# Patient Record
Sex: Male | Born: 1950 | Race: White | Hispanic: No | Marital: Single | Smoking: Current every day smoker
Health system: Southern US, Community
[De-identification: ages and names within clinical notes are randomized; demographics above are authoritative.]

## PROBLEM LIST (undated history)

## (undated) DIAGNOSIS — N400 Enlarged prostate without lower urinary tract symptoms: Secondary | ICD-10-CM

## (undated) DIAGNOSIS — N200 Calculus of kidney: Secondary | ICD-10-CM

## (undated) DIAGNOSIS — E119 Type 2 diabetes mellitus without complications: Secondary | ICD-10-CM

## (undated) DIAGNOSIS — R109 Unspecified abdominal pain: Secondary | ICD-10-CM

## (undated) DIAGNOSIS — I4581 Long QT syndrome: Secondary | ICD-10-CM

## (undated) DIAGNOSIS — E7800 Pure hypercholesterolemia, unspecified: Secondary | ICD-10-CM

## (undated) DIAGNOSIS — I2510 Atherosclerotic heart disease of native coronary artery without angina pectoris: Secondary | ICD-10-CM

## (undated) DIAGNOSIS — R9082 White matter disease, unspecified: Secondary | ICD-10-CM

## (undated) DIAGNOSIS — S329XXA Fracture of unspecified parts of lumbosacral spine and pelvis, initial encounter for closed fracture: Secondary | ICD-10-CM

## (undated) DIAGNOSIS — M109 Gout, unspecified: Secondary | ICD-10-CM

## (undated) DIAGNOSIS — F0390 Unspecified dementia without behavioral disturbance: Secondary | ICD-10-CM

## (undated) DIAGNOSIS — F1010 Alcohol abuse, uncomplicated: Secondary | ICD-10-CM

## (undated) DIAGNOSIS — R10A2 Flank pain, left side: Secondary | ICD-10-CM

## (undated) DIAGNOSIS — R519 Headache, unspecified: Secondary | ICD-10-CM

## (undated) DIAGNOSIS — N261 Atrophy of kidney (terminal): Secondary | ICD-10-CM

## (undated) DIAGNOSIS — G8929 Other chronic pain: Secondary | ICD-10-CM

## (undated) DIAGNOSIS — J189 Pneumonia, unspecified organism: Secondary | ICD-10-CM

## (undated) DIAGNOSIS — I10 Essential (primary) hypertension: Secondary | ICD-10-CM

## (undated) HISTORY — DX: Alcohol abuse, uncomplicated: F10.10

## (undated) HISTORY — DX: Fracture of unspecified parts of lumbosacral spine and pelvis, initial encounter for closed fracture: S32.9XXA

## (undated) HISTORY — DX: Benign prostatic hyperplasia without lower urinary tract symptoms: N40.0

## (undated) HISTORY — PX: FRACTURE SURGERY: SHX138

## (undated) HISTORY — DX: Unspecified dementia, unspecified severity, without behavioral disturbance, psychotic disturbance, mood disturbance, and anxiety: F03.90

## (undated) HISTORY — DX: Unspecified abdominal pain: R10.9

## (undated) HISTORY — DX: Calculus of kidney: N20.0

## (undated) HISTORY — DX: Flank pain, left side: R10.A2

## (undated) HISTORY — DX: Other chronic pain: G89.29

## (undated) HISTORY — PX: LITHOTRIPSY: SUR834

## (undated) HISTORY — DX: Atrophy of kidney (terminal): N26.1

## (undated) HISTORY — PX: SPLENECTOMY: SUR1306

---

## 2003-09-02 ENCOUNTER — Other Ambulatory Visit: Payer: Self-pay

## 2006-10-24 ENCOUNTER — Emergency Department: Payer: Self-pay | Admitting: Emergency Medicine

## 2007-01-12 ENCOUNTER — Ambulatory Visit: Payer: Self-pay

## 2007-06-29 ENCOUNTER — Other Ambulatory Visit: Payer: Self-pay

## 2007-06-29 ENCOUNTER — Ambulatory Visit: Payer: Self-pay | Admitting: Urology

## 2007-07-05 ENCOUNTER — Ambulatory Visit: Payer: Self-pay | Admitting: Urology

## 2007-12-22 ENCOUNTER — Emergency Department (HOSPITAL_COMMUNITY): Admission: EM | Admit: 2007-12-22 | Discharge: 2007-12-22 | Payer: Self-pay | Admitting: Emergency Medicine

## 2007-12-23 ENCOUNTER — Ambulatory Visit: Payer: Self-pay | Admitting: Unknown Physician Specialty

## 2008-12-19 ENCOUNTER — Ambulatory Visit: Payer: Self-pay | Admitting: Urology

## 2009-03-15 ENCOUNTER — Emergency Department: Payer: Self-pay | Admitting: Emergency Medicine

## 2009-03-19 ENCOUNTER — Ambulatory Visit: Payer: Self-pay | Admitting: Urology

## 2009-03-23 ENCOUNTER — Ambulatory Visit: Payer: Self-pay | Admitting: Urology

## 2009-10-30 ENCOUNTER — Ambulatory Visit: Payer: Self-pay | Admitting: Unknown Physician Specialty

## 2010-04-22 ENCOUNTER — Ambulatory Visit: Payer: Self-pay | Admitting: Urology

## 2010-10-26 ENCOUNTER — Ambulatory Visit: Payer: Self-pay | Admitting: Urology

## 2010-10-28 ENCOUNTER — Ambulatory Visit: Payer: Self-pay | Admitting: Urology

## 2011-08-01 ENCOUNTER — Ambulatory Visit: Payer: Self-pay | Admitting: Urology

## 2011-10-11 HISTORY — PX: OTHER SURGICAL HISTORY: SHX169

## 2012-07-05 ENCOUNTER — Ambulatory Visit: Payer: Self-pay | Admitting: Urology

## 2012-08-22 ENCOUNTER — Ambulatory Visit: Payer: Self-pay | Admitting: Vascular Surgery

## 2012-08-22 LAB — CREATININE, SERUM
Creatinine: 1.85 mg/dL — ABNORMAL HIGH (ref 0.60–1.30)
EGFR (Non-African Amer.): 39 — ABNORMAL LOW

## 2012-08-23 LAB — BASIC METABOLIC PANEL
BUN: 18 mg/dL (ref 7–18)
Calcium, Total: 8.8 mg/dL (ref 8.5–10.1)
Chloride: 102 mmol/L (ref 98–107)
Co2: 25 mmol/L (ref 21–32)
Creatinine: 1.29 mg/dL (ref 0.60–1.30)
Glucose: 97 mg/dL (ref 65–99)
Potassium: 3.9 mmol/L (ref 3.5–5.1)
Sodium: 135 mmol/L — ABNORMAL LOW (ref 136–145)

## 2013-08-05 ENCOUNTER — Encounter (HOSPITAL_BASED_OUTPATIENT_CLINIC_OR_DEPARTMENT_OTHER): Payer: Self-pay | Admitting: Emergency Medicine

## 2013-08-05 ENCOUNTER — Emergency Department (HOSPITAL_BASED_OUTPATIENT_CLINIC_OR_DEPARTMENT_OTHER)
Admission: EM | Admit: 2013-08-05 | Discharge: 2013-08-05 | Disposition: A | Discharge status: Home / Self Care | Payer: Worker's Compensation | Attending: Emergency Medicine | Admitting: Emergency Medicine

## 2013-08-05 ENCOUNTER — Emergency Department (HOSPITAL_BASED_OUTPATIENT_CLINIC_OR_DEPARTMENT_OTHER): Payer: Worker's Compensation

## 2013-08-05 DIAGNOSIS — S8990XA Unspecified injury of unspecified lower leg, initial encounter: Secondary | ICD-10-CM | POA: Insufficient documentation

## 2013-08-05 DIAGNOSIS — Y92410 Unspecified street and highway as the place of occurrence of the external cause: Secondary | ICD-10-CM | POA: Insufficient documentation

## 2013-08-05 DIAGNOSIS — IMO0002 Reserved for concepts with insufficient information to code with codable children: Secondary | ICD-10-CM | POA: Insufficient documentation

## 2013-08-05 DIAGNOSIS — Y9389 Activity, other specified: Secondary | ICD-10-CM | POA: Insufficient documentation

## 2013-08-05 DIAGNOSIS — R0789 Other chest pain: Secondary | ICD-10-CM

## 2013-08-05 DIAGNOSIS — T07XXXA Unspecified multiple injuries, initial encounter: Secondary | ICD-10-CM

## 2013-08-05 DIAGNOSIS — Z79899 Other long term (current) drug therapy: Secondary | ICD-10-CM | POA: Insufficient documentation

## 2013-08-05 DIAGNOSIS — F17200 Nicotine dependence, unspecified, uncomplicated: Secondary | ICD-10-CM | POA: Insufficient documentation

## 2013-08-05 DIAGNOSIS — S298XXA Other specified injuries of thorax, initial encounter: Secondary | ICD-10-CM | POA: Insufficient documentation

## 2013-08-05 DIAGNOSIS — I10 Essential (primary) hypertension: Secondary | ICD-10-CM | POA: Insufficient documentation

## 2013-08-05 HISTORY — DX: Essential (primary) hypertension: I10

## 2013-08-05 MED ORDER — OXYCODONE-ACETAMINOPHEN 5-325 MG PO TABS: 2.0000 | ORAL_TABLET | ORAL | Status: DC | PRN

## 2013-08-05 MED ORDER — OXYCODONE-ACETAMINOPHEN 5-325 MG PO TABS
2.0000 | ORAL_TABLET | Freq: Once | ORAL | Status: AC
  Administered 2013-08-05: 2 via ORAL
  Filled 2013-08-05: qty 2

## 2013-08-05 MED ORDER — LORAZEPAM 1 MG PO TABS
0.5000 mg | ORAL_TABLET | Freq: Once | ORAL | Status: AC
  Administered 2013-08-05: 0.5 mg via ORAL
  Filled 2013-08-05: qty 1
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
## 2013-09-20 ENCOUNTER — Ambulatory Visit: Payer: Self-pay | Admitting: Urology

## 2014-01-15 ENCOUNTER — Inpatient Hospital Stay: Payer: Self-pay | Admitting: Internal Medicine

## 2014-01-15 LAB — TROPONIN I
Troponin-I: <0.02 ng/mL
Troponin-I: <0.02 ng/mL

## 2014-01-15 LAB — BASIC METABOLIC PANEL
ANION GAP: 4 — AB (ref 7–16)
BUN: 40 mg/dL — ABNORMAL HIGH (ref 7–18)
CALCIUM: 8.6 mg/dL (ref 8.5–10.1)
CREATININE: 2.3 mg/dL — AB (ref 0.60–1.30)
Chloride: 106 mmol/L (ref 98–107)
Co2: 26 mmol/L (ref 21–32)
EGFR (African American): 34 — ABNORMAL LOW
GFR CALC NON AF AMER: 30 — AB
Glucose: 75 mg/dL (ref 65–99)
OSMOLALITY: 280 (ref 275–301)
Potassium: 5 mmol/L (ref 3.5–5.1)
Sodium: 136 mmol/L (ref 136–145)

## 2014-01-15 LAB — URINALYSIS, COMPLETE
Bacteria: NONE SEEN
Bilirubin,UR: NEGATIVE
Blood: NEGATIVE
Glucose,UR: NEGATIVE mg/dL (ref 0–75)
Hyaline Cast: 18
KETONE: NEGATIVE
Leukocyte Esterase: NEGATIVE
Nitrite: NEGATIVE
PROTEIN: NEGATIVE
Ph: 5 (ref 4.5–8.0)
RBC, UR: NONE SEEN /HPF (ref 0–5)
SPECIFIC GRAVITY: 1.014 (ref 1.003–1.030)
Squamous Epithelial: NONE SEEN
WBC UR: NONE SEEN /HPF (ref 0–5)

## 2014-01-15 LAB — CBC WITH DIFFERENTIAL/PLATELET
BASOS PCT: 0.8 %
Basophil #: 0.1 10*3/uL (ref 0.0–0.1)
EOS ABS: 0.3 10*3/uL (ref 0.0–0.7)
EOS PCT: 1.7 %
HCT: 33.5 % — ABNORMAL LOW (ref 40.0–52.0)
HGB: 11.4 g/dL — AB (ref 13.0–18.0)
LYMPHS ABS: 4.8 10*3/uL — AB (ref 1.0–3.6)
Lymphocyte %: 27.6 %
MCH: 37.5 pg — ABNORMAL HIGH (ref 26.0–34.0)
MCHC: 34 g/dL (ref 32.0–36.0)
MCV: 110 fL — AB (ref 80–100)
MONOS PCT: 8 %
Monocyte #: 1.4 x10 3/mm — ABNORMAL HIGH (ref 0.2–1.0)
NEUTROS ABS: 10.7 10*3/uL — AB (ref 1.4–6.5)
NEUTROS PCT: 61.9 %
Platelet: 308 10*3/uL (ref 150–440)
RBC: 3.04 10*6/uL — ABNORMAL LOW (ref 4.40–5.90)
RDW: 14.1 % (ref 11.5–14.5)
WBC: 17.4 10*3/uL — ABNORMAL HIGH (ref 3.8–10.6)

## 2014-01-15 LAB — HEMOGLOBIN A1C: HEMOGLOBIN A1C: 5.6 % (ref 4.2–6.3)

## 2014-01-15 LAB — DRUG SCREEN, URINE
Amphetamines, Ur Screen: NEGATIVE (ref ?–1000)
BENZODIAZEPINE, UR SCRN: POSITIVE (ref ?–200)
Barbiturates, Ur Screen: NEGATIVE (ref ?–200)
Cannabinoid 50 Ng, Ur ~~LOC~~: NEGATIVE (ref ?–50)
Cocaine Metabolite,Ur ~~LOC~~: NEGATIVE (ref ?–300)
MDMA (Ecstasy)Ur Screen: NEGATIVE (ref ?–500)
Methadone, Ur Screen: NEGATIVE (ref ?–300)
OPIATE, UR SCREEN: POSITIVE (ref ?–300)
Phencyclidine (PCP) Ur S: NEGATIVE (ref ?–25)
Tricyclic, Ur Screen: NEGATIVE (ref ?–1000)

## 2014-01-15 LAB — MAGNESIUM: MAGNESIUM: 1.2 mg/dL — AB

## 2014-01-15 LAB — CK TOTAL AND CKMB (NOT AT ARMC)
CK, Total: 84 U/L
CK-MB: 1.6 ng/mL (ref 0.5–3.6)

## 2014-01-16 LAB — CBC WITH DIFFERENTIAL/PLATELET
Basophil #: 0.1 10*3/uL (ref 0.0–0.1)
Basophil %: 0.6 %
EOS ABS: 0.2 10*3/uL (ref 0.0–0.7)
Eosinophil %: 1.5 %
HCT: 31.9 % — ABNORMAL LOW (ref 40.0–52.0)
HGB: 11.1 g/dL — ABNORMAL LOW (ref 13.0–18.0)
LYMPHS ABS: 4.4 10*3/uL — AB (ref 1.0–3.6)
Lymphocyte %: 29.8 %
MCH: 38.3 pg — AB (ref 26.0–34.0)
MCHC: 34.8 g/dL (ref 32.0–36.0)
MCV: 110 fL — AB (ref 80–100)
Monocyte #: 1.3 x10 3/mm — ABNORMAL HIGH (ref 0.2–1.0)
Monocyte %: 8.8 %
NEUTROS ABS: 8.8 10*3/uL — AB (ref 1.4–6.5)
NEUTROS PCT: 59.3 %
PLATELETS: 289 10*3/uL (ref 150–440)
RBC: 2.9 10*6/uL — ABNORMAL LOW (ref 4.40–5.90)
RDW: 14.1 % (ref 11.5–14.5)
WBC: 14.8 10*3/uL — ABNORMAL HIGH (ref 3.8–10.6)

## 2014-01-16 LAB — BASIC METABOLIC PANEL
Anion Gap: 5 — ABNORMAL LOW (ref 7–16)
BUN: 29 mg/dL — ABNORMAL HIGH (ref 7–18)
CHLORIDE: 111 mmol/L — AB (ref 98–107)
CREATININE: 1.85 mg/dL — AB (ref 0.60–1.30)
Calcium, Total: 8.5 mg/dL (ref 8.5–10.1)
Co2: 23 mmol/L (ref 21–32)
GFR CALC AF AMER: 45 — AB
GFR CALC NON AF AMER: 38 — AB
Glucose: 110 mg/dL — ABNORMAL HIGH (ref 65–99)
Osmolality: 284 (ref 275–301)
Potassium: 5 mmol/L (ref 3.5–5.1)
Sodium: 139 mmol/L (ref 136–145)

## 2014-01-20 LAB — CULTURE, BLOOD (SINGLE)

## 2014-11-17 ENCOUNTER — Emergency Department (HOSPITAL_COMMUNITY): Payer: 59

## 2014-11-17 ENCOUNTER — Encounter (HOSPITAL_COMMUNITY): Payer: Self-pay | Admitting: Emergency Medicine

## 2014-11-17 ENCOUNTER — Inpatient Hospital Stay (HOSPITAL_COMMUNITY)
Admission: EM | Admit: 2014-11-17 | Discharge: 2014-11-25 | DRG: 492 | Disposition: A | Discharge status: Rehabilitation Facility | Payer: 59 | Attending: General Surgery | Admitting: General Surgery

## 2014-11-17 ENCOUNTER — Telehealth (HOSPITAL_COMMUNITY): Payer: Self-pay

## 2014-11-17 DIAGNOSIS — I129 Hypertensive chronic kidney disease with stage 1 through stage 4 chronic kidney disease, or unspecified chronic kidney disease: Secondary | ICD-10-CM | POA: Diagnosis present

## 2014-11-17 DIAGNOSIS — F1910 Other psychoactive substance abuse, uncomplicated: Secondary | ICD-10-CM | POA: Diagnosis present

## 2014-11-17 DIAGNOSIS — M109 Gout, unspecified: Secondary | ICD-10-CM | POA: Diagnosis present

## 2014-11-17 DIAGNOSIS — N189 Chronic kidney disease, unspecified: Secondary | ICD-10-CM | POA: Diagnosis present

## 2014-11-17 DIAGNOSIS — W109XXA Fall (on) (from) unspecified stairs and steps, initial encounter: Secondary | ICD-10-CM | POA: Diagnosis present

## 2014-11-17 DIAGNOSIS — E780 Pure hypercholesterolemia: Secondary | ICD-10-CM | POA: Diagnosis present

## 2014-11-17 DIAGNOSIS — S0083XA Contusion of other part of head, initial encounter: Secondary | ICD-10-CM | POA: Diagnosis present

## 2014-11-17 DIAGNOSIS — D62 Acute posthemorrhagic anemia: Secondary | ICD-10-CM | POA: Diagnosis not present

## 2014-11-17 DIAGNOSIS — S32810A Multiple fractures of pelvis with stable disruption of pelvic ring, initial encounter for closed fracture: Secondary | ICD-10-CM

## 2014-11-17 DIAGNOSIS — S3282XA Multiple fractures of pelvis without disruption of pelvic ring, initial encounter for closed fracture: Secondary | ICD-10-CM | POA: Diagnosis present

## 2014-11-17 DIAGNOSIS — S42412A Displaced simple supracondylar fracture without intercondylar fracture of left humerus, initial encounter for closed fracture: Secondary | ICD-10-CM | POA: Diagnosis present

## 2014-11-17 DIAGNOSIS — J95821 Acute postprocedural respiratory failure: Secondary | ICD-10-CM | POA: Diagnosis not present

## 2014-11-17 DIAGNOSIS — Z01818 Encounter for other preprocedural examination: Secondary | ICD-10-CM

## 2014-11-17 DIAGNOSIS — Z4659 Encounter for fitting and adjustment of other gastrointestinal appliance and device: Secondary | ICD-10-CM

## 2014-11-17 DIAGNOSIS — I10 Essential (primary) hypertension: Secondary | ICD-10-CM | POA: Diagnosis present

## 2014-11-17 DIAGNOSIS — R339 Retention of urine, unspecified: Secondary | ICD-10-CM | POA: Diagnosis not present

## 2014-11-17 DIAGNOSIS — G9341 Metabolic encephalopathy: Secondary | ICD-10-CM | POA: Diagnosis not present

## 2014-11-17 DIAGNOSIS — F17210 Nicotine dependence, cigarettes, uncomplicated: Secondary | ICD-10-CM | POA: Diagnosis present

## 2014-11-17 DIAGNOSIS — Z6825 Body mass index (BMI) 25.0-25.9, adult: Secondary | ICD-10-CM

## 2014-11-17 DIAGNOSIS — Z9119 Patient's noncompliance with other medical treatment and regimen: Secondary | ICD-10-CM | POA: Diagnosis present

## 2014-11-17 DIAGNOSIS — W19XXXA Unspecified fall, initial encounter: Secondary | ICD-10-CM | POA: Diagnosis present

## 2014-11-17 DIAGNOSIS — F1310 Sedative, hypnotic or anxiolytic abuse, uncomplicated: Secondary | ICD-10-CM | POA: Diagnosis present

## 2014-11-17 DIAGNOSIS — S2242XA Multiple fractures of ribs, left side, initial encounter for closed fracture: Secondary | ICD-10-CM | POA: Diagnosis present

## 2014-11-17 DIAGNOSIS — R531 Weakness: Secondary | ICD-10-CM | POA: Diagnosis present

## 2014-11-17 DIAGNOSIS — D72829 Elevated white blood cell count, unspecified: Secondary | ICD-10-CM | POA: Diagnosis not present

## 2014-11-17 DIAGNOSIS — Y92009 Unspecified place in unspecified non-institutional (private) residence as the place of occurrence of the external cause: Secondary | ICD-10-CM

## 2014-11-17 DIAGNOSIS — D539 Nutritional anemia, unspecified: Secondary | ICD-10-CM | POA: Diagnosis present

## 2014-11-17 DIAGNOSIS — N170 Acute kidney failure with tubular necrosis: Secondary | ICD-10-CM | POA: Diagnosis not present

## 2014-11-17 DIAGNOSIS — E861 Hypovolemia: Secondary | ICD-10-CM | POA: Diagnosis present

## 2014-11-17 DIAGNOSIS — F10239 Alcohol dependence with withdrawal, unspecified: Secondary | ICD-10-CM | POA: Diagnosis present

## 2014-11-17 DIAGNOSIS — Z91199 Patient's noncompliance with other medical treatment and regimen due to unspecified reason: Secondary | ICD-10-CM

## 2014-11-17 DIAGNOSIS — S32119A Unspecified Zone I fracture of sacrum, initial encounter for closed fracture: Secondary | ICD-10-CM | POA: Diagnosis present

## 2014-11-17 DIAGNOSIS — E43 Unspecified severe protein-calorie malnutrition: Secondary | ICD-10-CM | POA: Insufficient documentation

## 2014-11-17 DIAGNOSIS — S42302A Unspecified fracture of shaft of humerus, left arm, initial encounter for closed fracture: Secondary | ICD-10-CM

## 2014-11-17 DIAGNOSIS — R739 Hyperglycemia, unspecified: Secondary | ICD-10-CM | POA: Diagnosis present

## 2014-11-17 DIAGNOSIS — Y92019 Unspecified place in single-family (private) house as the place of occurrence of the external cause: Secondary | ICD-10-CM | POA: Diagnosis not present

## 2014-11-17 DIAGNOSIS — S329XXA Fracture of unspecified parts of lumbosacral spine and pelvis, initial encounter for closed fracture: Secondary | ICD-10-CM

## 2014-11-17 DIAGNOSIS — S2239XA Fracture of one rib, unspecified side, initial encounter for closed fracture: Secondary | ICD-10-CM

## 2014-11-17 DIAGNOSIS — S42402A Unspecified fracture of lower end of left humerus, initial encounter for closed fracture: Secondary | ICD-10-CM | POA: Diagnosis present

## 2014-11-17 DIAGNOSIS — E785 Hyperlipidemia, unspecified: Secondary | ICD-10-CM | POA: Diagnosis present

## 2014-11-17 DIAGNOSIS — F1010 Alcohol abuse, uncomplicated: Secondary | ICD-10-CM | POA: Diagnosis present

## 2014-11-17 DIAGNOSIS — S0093XA Contusion of unspecified part of head, initial encounter: Secondary | ICD-10-CM

## 2014-11-17 DIAGNOSIS — S42409A Unspecified fracture of lower end of unspecified humerus, initial encounter for closed fracture: Secondary | ICD-10-CM

## 2014-11-17 HISTORY — DX: Fracture of unspecified parts of lumbosacral spine and pelvis, initial encounter for closed fracture: S32.9XXA

## 2014-11-17 HISTORY — DX: Pure hypercholesterolemia, unspecified: E78.00

## 2014-11-17 HISTORY — DX: Gout, unspecified: M10.9

## 2014-11-17 LAB — COMPREHENSIVE METABOLIC PANEL WITH GFR
ALT: 24 U/L (ref 0–53)
AST: 45 U/L — ABNORMAL HIGH (ref 0–37)
Albumin: 3.5 g/dL (ref 3.5–5.2)
Alkaline Phosphatase: 115 U/L (ref 39–117)
Anion gap: 13 (ref 5–15)
BUN: 21 mg/dL (ref 6–23)
CO2: 19 mmol/L (ref 19–32)
Calcium: 8.5 mg/dL (ref 8.4–10.5)
Chloride: 106 mmol/L (ref 96–112)
Creatinine, Ser: 2.24 mg/dL — ABNORMAL HIGH (ref 0.50–1.35)
GFR calc Af Amer: 34 mL/min — ABNORMAL LOW (ref >90)
GFR calc non Af Amer: 30 mL/min — ABNORMAL LOW (ref >90)
Glucose, Bld: 121 mg/dL — ABNORMAL HIGH (ref 70–99)
Potassium: 5.1 mmol/L (ref 3.5–5.1)
Sodium: 138 mmol/L (ref 135–145)
Total Bilirubin: 1.7 mg/dL — ABNORMAL HIGH (ref 0.3–1.2)
Total Protein: 5.7 g/dL — ABNORMAL LOW (ref 6.0–8.3)

## 2014-11-17 LAB — URINALYSIS, ROUTINE W REFLEX MICROSCOPIC
Bilirubin Urine: NEGATIVE
Glucose, UA: NEGATIVE mg/dL
Ketones, ur: NEGATIVE mg/dL
Leukocytes, UA: NEGATIVE
Nitrite: NEGATIVE
Protein, ur: NEGATIVE mg/dL
Specific Gravity, Urine: 1.014 (ref 1.005–1.030)
Urobilinogen, UA: 0.2 mg/dL (ref 0.0–1.0)
pH: 5 (ref 5.0–8.0)

## 2014-11-17 LAB — I-STAT VENOUS BLOOD GAS, ED
ACID-BASE DEFICIT: 7 mmol/L — AB (ref 0.0–2.0)
Bicarbonate: 19.1 mEq/L — ABNORMAL LOW (ref 20.0–24.0)
O2 Saturation: 47 %
PH VEN: 7.285 (ref 7.250–7.300)
PO2 VEN: 29 mmHg — AB (ref 30.0–45.0)
TCO2: 20 mmol/L (ref 0–100)
pCO2, Ven: 40.3 mmHg — ABNORMAL LOW (ref 45.0–50.0)

## 2014-11-17 LAB — CBC WITH DIFFERENTIAL/PLATELET
BASOS ABS: 0 10*3/uL (ref 0.0–0.1)
BASOS PCT: 0 % (ref 0–1)
Eosinophils Absolute: 0 10*3/uL (ref 0.0–0.7)
Eosinophils Relative: 0 % (ref 0–5)
HCT: 26.8 % — ABNORMAL LOW (ref 39.0–52.0)
Hemoglobin: 9.2 g/dL — ABNORMAL LOW (ref 13.0–17.0)
LYMPHS PCT: 2 % — AB (ref 12–46)
Lymphs Abs: 0.4 10*3/uL — ABNORMAL LOW (ref 0.7–4.0)
MCH: 36.8 pg — ABNORMAL HIGH (ref 26.0–34.0)
MCHC: 34.3 g/dL (ref 30.0–36.0)
MCV: 107.2 fL — ABNORMAL HIGH (ref 78.0–100.0)
MONO ABS: 1.7 10*3/uL — AB (ref 0.1–1.0)
Monocytes Relative: 9 % (ref 3–12)
NEUTROS ABS: 16.6 10*3/uL — AB (ref 1.7–7.7)
NEUTROS PCT: 89 % — AB (ref 43–77)
Platelets: 285 10*3/uL (ref 150–400)
RBC: 2.5 MIL/uL — ABNORMAL LOW (ref 4.22–5.81)
RDW: 14.8 % (ref 11.5–15.5)
WBC: 18.7 10*3/uL — AB (ref 4.0–10.5)

## 2014-11-17 LAB — ETHANOL

## 2014-11-17 LAB — URINE MICROSCOPIC-ADD ON

## 2014-11-17 LAB — RETICULOCYTES
RBC.: 2.07 MIL/uL — ABNORMAL LOW (ref 4.22–5.81)
RETIC COUNT ABSOLUTE: 58 10*3/uL (ref 19.0–186.0)
Retic Ct Pct: 2.8 % (ref 0.4–3.1)

## 2014-11-17 LAB — TSH: TSH: 1.88 u[IU]/mL (ref 0.350–4.500)

## 2014-11-17 LAB — MRSA PCR SCREENING: MRSA by PCR: NEGATIVE

## 2014-11-17 LAB — AMMONIA: AMMONIA: 33 umol/L — AB (ref 11–32)

## 2014-11-17 LAB — BRAIN NATRIURETIC PEPTIDE: B Natriuretic Peptide: 103.7 pg/mL — ABNORMAL HIGH (ref 0.0–100.0)

## 2014-11-17 LAB — LACTIC ACID, PLASMA
LACTIC ACID, VENOUS: 1.5 mmol/L (ref 0.5–2.0)
Lactic Acid, Venous: 3.7 mmol/L (ref 0.5–2.0)

## 2014-11-17 LAB — I-STAT TROPONIN, ED: TROPONIN I, POC: 0 ng/mL (ref 0.00–0.08)

## 2014-11-17 MED ORDER — LORAZEPAM 2 MG/ML IJ SOLN
1.0000 mg | Freq: Four times a day (QID) | INTRAMUSCULAR | Status: DC | PRN
Start: 2014-11-17 — End: 2014-11-17

## 2014-11-17 MED ORDER — LORAZEPAM 1 MG PO TABS
0.0000 mg | ORAL_TABLET | Freq: Four times a day (QID) | ORAL | Status: DC
Start: 2014-11-17 — End: 2014-11-17
  Administered 2014-11-17: 2 mg via ORAL
  Filled 2014-11-17: qty 2

## 2014-11-17 MED ORDER — SODIUM CHLORIDE 0.9 % IV BOLUS (SEPSIS)
1000.0000 mL | Freq: Once | INTRAVENOUS | Status: AC
  Administered 2014-11-17: 1000 mL via INTRAVENOUS

## 2014-11-17 MED ORDER — VITAMIN B-1 100 MG PO TABS
100.0000 mg | ORAL_TABLET | Freq: Every day | ORAL | Status: DC
  Administered 2014-11-21 – 2014-11-25 (×5): 100 mg via ORAL
  Filled 2014-11-17 (×9): qty 1

## 2014-11-17 MED ORDER — PANTOPRAZOLE SODIUM 40 MG IV SOLR
40.0000 mg | Freq: Every day | INTRAVENOUS | Status: DC
  Administered 2014-11-18 – 2014-11-20 (×3): 40 mg via INTRAVENOUS
  Filled 2014-11-17 (×5): qty 40

## 2014-11-17 MED ORDER — THIAMINE HCL 100 MG/ML IJ SOLN
100.0000 mg | Freq: Every day | INTRAMUSCULAR | Status: DC
  Administered 2014-11-17 – 2014-11-20 (×4): 100 mg via INTRAVENOUS
  Filled 2014-11-17 (×6): qty 1

## 2014-11-17 MED ORDER — TETANUS-DIPHTH-ACELL PERTUSSIS 5-2.5-18.5 LF-MCG/0.5 IM SUSP: 0.5000 mL | Freq: Once | INTRAMUSCULAR | Status: DC

## 2014-11-17 MED ORDER — OXYCODONE HCL 5 MG PO TABS
5.0000 mg | ORAL_TABLET | ORAL | Status: DC | PRN
  Administered 2014-11-17: 5 mg via ORAL
  Administered 2014-11-18 (×2): 10 mg via ORAL
  Administered 2014-11-19 – 2014-11-20 (×3): 15 mg via ORAL
  Administered 2014-11-21: 10 mg via ORAL
  Administered 2014-11-21 (×2): 15 mg via ORAL
  Administered 2014-11-22: 10 mg via ORAL
  Administered 2014-11-22: 15 mg via ORAL
  Administered 2014-11-22: 5 mg via ORAL
  Administered 2014-11-23 – 2014-11-25 (×7): 15 mg via ORAL
  Filled 2014-11-17 (×6): qty 3
  Filled 2014-11-17: qty 2
  Filled 2014-11-17: qty 3
  Filled 2014-11-17: qty 1
  Filled 2014-11-17: qty 2
  Filled 2014-11-17 (×2): qty 1
  Filled 2014-11-17 (×4): qty 3
  Filled 2014-11-17: qty 2
  Filled 2014-11-17: qty 3
  Filled 2014-11-17: qty 2
  Filled 2014-11-17: qty 3

## 2014-11-17 MED ORDER — MORPHINE SULFATE 2 MG/ML IJ SOLN: 2.0000 mg | INTRAMUSCULAR | Status: DC | PRN

## 2014-11-17 MED ORDER — MORPHINE SULFATE 4 MG/ML IJ SOLN
7.0000 mg | Freq: Once | INTRAMUSCULAR | Status: AC
  Administered 2014-11-17: 7 mg via INTRAVENOUS
  Filled 2014-11-17: qty 2

## 2014-11-17 MED ORDER — ADULT MULTIVITAMIN W/MINERALS CH
1.0000 | ORAL_TABLET | Freq: Every day | ORAL | Status: DC
  Administered 2014-11-18 – 2014-11-25 (×8): 1 via ORAL
  Filled 2014-11-17 (×9): qty 1

## 2014-11-17 MED ORDER — DEXTROSE-NACL 5-0.45 % IV SOLN
INTRAVENOUS | Status: DC
  Administered 2014-11-17 – 2014-11-21 (×6): via INTRAVENOUS

## 2014-11-17 MED ORDER — ONDANSETRON HCL 4 MG/2ML IJ SOLN
4.0000 mg | Freq: Four times a day (QID) | INTRAMUSCULAR | Status: DC | PRN
  Administered 2014-11-19 – 2014-11-24 (×3): 4 mg via INTRAVENOUS
  Filled 2014-11-17 (×3): qty 2

## 2014-11-17 MED ORDER — LORAZEPAM 1 MG PO TABS: 0.0000 mg | ORAL_TABLET | Freq: Two times a day (BID) | ORAL | Status: DC

## 2014-11-17 MED ORDER — ONDANSETRON HCL 4 MG PO TABS: 4.0000 mg | ORAL_TABLET | Freq: Four times a day (QID) | ORAL | Status: DC | PRN

## 2014-11-17 MED ORDER — FOLIC ACID 5 MG/ML IJ SOLN
1.0000 mg | Freq: Every day | INTRAMUSCULAR | Status: DC
  Administered 2014-11-18 – 2014-11-21 (×4): 1 mg via INTRAVENOUS
  Filled 2014-11-17 (×7): qty 0.2

## 2014-11-17 MED ORDER — LORAZEPAM 2 MG/ML IJ SOLN
2.0000 mg | INTRAMUSCULAR | Status: DC | PRN
  Administered 2014-11-17 – 2014-11-19 (×3): 2 mg via INTRAVENOUS
  Filled 2014-11-17 (×3): qty 1

## 2014-11-17 MED ORDER — MORPHINE SULFATE 4 MG/ML IJ SOLN
7.0000 mg | Freq: Once | INTRAMUSCULAR | Status: AC
Start: 2014-11-17 — End: 2014-11-17
  Administered 2014-11-17: 7 mg via INTRAVENOUS
  Filled 2014-11-17: qty 2

## 2014-11-17 MED ORDER — IOHEXOL 300 MG/ML  SOLN: 25.0000 mL | INTRAMUSCULAR | Status: AC

## 2014-11-17 MED ORDER — IPRATROPIUM-ALBUTEROL 0.5-2.5 (3) MG/3ML IN SOLN
3.0000 mL | RESPIRATORY_TRACT | Status: DC
  Administered 2014-11-17 (×2): 3 mL via RESPIRATORY_TRACT
  Filled 2014-11-17 (×2): qty 3

## 2014-11-17 MED ORDER — DOCUSATE SODIUM 100 MG PO CAPS
100.0000 mg | ORAL_CAPSULE | Freq: Two times a day (BID) | ORAL | Status: DC
  Administered 2014-11-18 – 2014-11-25 (×13): 100 mg via ORAL
  Filled 2014-11-17 (×18): qty 1

## 2014-11-17 MED ORDER — SIMVASTATIN 40 MG PO TABS
40.0000 mg | ORAL_TABLET | Freq: Every day | ORAL | Status: DC
  Administered 2014-11-18 – 2014-11-25 (×7): 40 mg via ORAL
  Filled 2014-11-17 (×8): qty 1

## 2014-11-17 MED ORDER — IPRATROPIUM-ALBUTEROL 0.5-2.5 (3) MG/3ML IN SOLN
3.0000 mL | Freq: Three times a day (TID) | RESPIRATORY_TRACT | Status: DC
  Administered 2014-11-18 – 2014-11-24 (×17): 3 mL via RESPIRATORY_TRACT
  Filled 2014-11-17 (×18): qty 3

## 2014-11-17 MED ORDER — THIAMINE HCL 100 MG/ML IJ SOLN: 100.0000 mg | Freq: Every day | INTRAMUSCULAR | Status: DC

## 2014-11-17 MED ORDER — FOLIC ACID 1 MG PO TABS
1.0000 mg | ORAL_TABLET | Freq: Every day | ORAL | Status: DC
  Filled 2014-11-17: qty 1

## 2014-11-17 MED ORDER — PANTOPRAZOLE SODIUM 40 MG PO TBEC
40.0000 mg | DELAYED_RELEASE_TABLET | Freq: Every day | ORAL | Status: DC
  Administered 2014-11-21 – 2014-11-24 (×4): 40 mg via ORAL
  Filled 2014-11-17 (×5): qty 1

## 2014-11-17 MED ORDER — ENOXAPARIN SODIUM 30 MG/0.3ML ~~LOC~~ SOLN
30.0000 mg | SUBCUTANEOUS | Status: DC
  Administered 2014-11-18 – 2014-11-24 (×7): 30 mg via SUBCUTANEOUS
  Filled 2014-11-17 (×9): qty 0.3

## 2014-11-17 MED ORDER — ALLOPURINOL 150 MG HALF TABLET
150.0000 mg | ORAL_TABLET | Freq: Every day | ORAL | Status: DC
  Administered 2014-11-18 – 2014-11-25 (×8): 150 mg via ORAL
  Filled 2014-11-17 (×8): qty 1

## 2014-11-17 MED ORDER — LORAZEPAM 1 MG PO TABS
1.0000 mg | ORAL_TABLET | Freq: Four times a day (QID) | ORAL | Status: DC | PRN
Start: 2014-11-17 — End: 2014-11-17

## 2014-11-17 NOTE — ED Notes (Signed)
Pt has multiple skin tears on arms. Pt has skin tear to right hand, and forearm. Pt has large skin tear to left forearm and widespread swelling/purple bruising to left forearm up to elbow. Pt states when he fell yesterday, he fell down stairs and onto concrete. Pt again states he did not lose consciousness. Pt has abrasions to right side of forehead.

## 2014-11-17 NOTE — Consult Note (Signed)
Reason for Consult:Left elbow and left hip fractures  Referring [place] R Beadles is an 64 y.o. male.  LGX:[id number] mechanical fall yesterday without loss of conciousness. Patient attempted to drive self to work this AM but was unable to make it to work. Sister reports he was found in parking lot at work . Patient brought to ER and found to have a left elbow and pelvic fractures.  Past Medical History  Diagnosis Date  . Hypertension   . Hypercholesteremia     Past Surgical History  Procedure Laterality Date  . Nephrectomy      History reviewed. No pertinent family history.  Social History:  reports that he has been smoking.  He does not have any smokeless tobacco history on file. He reports that he drinks alcohol. His drug history is not on file.  Allergies: No Known Allergies  Medications: I have reviewed the patient's current medications.  Results for orders placed or performed during the hospital encounter of 11/17/14 (from the past 48 hour(s))  CBC with Differential     Status: Abnormal   Collection Time: 11/17/14  9:26 AM  Result Value Ref Range   WBC 18.7 (H) 4.0 - 10.5 K/uL   RBC 2.50 (L) 4.22 - 5.81 MIL/uL   Hemoglobin 9.2 (L) 13.0 - 17.0 g/dL   HCT 26.8 (L) 39.0 - 52.0 %   MCV 107.2 (H) 78.0 - 100.0 fL   MCH 36.8 (H) 26.0 - 34.0 pg   MCHC 34.3 30.0 - 36.0 g/dL   RDW 14.8 11.5 - 15.5 %   Platelets 285 150 - 400 K/uL   Neutrophils Relative % 89 (H) 43 - 77 %   Neutro Abs 16.6 (H) 1.7 - 7.7 K/uL   Lymphocytes Relative 2 (L) 12 - 46 %   Lymphs Abs 0.4 (L) 0.7 - 4.0 K/uL   Monocytes Relative 9 3 - 12 %   Monocytes Absolute 1.7 (H) 0.1 - 1.0 K/uL   Eosinophils Relative 0 0 - 5 %   Eosinophils Absolute 0.0 0.0 - 0.7 K/uL   Basophils Relative 0 0 - 1 %   Basophils Absolute 0.0 0.0 - 0.1 K/uL  Comprehensive metabolic panel     Status: Abnormal   Collection Time: 11/17/14  9:26 AM  Result Value Ref Range   Sodium 138 135 - 145 mmol/L    Potassium 5.1 3.5 - 5.1 mmol/L   Chloride 106 96 - 112 mmol/L   CO2 19 19 - 32 mmol/L   Glucose, Bld 121 (H) 70 - 99 mg/dL   BUN 21 6 - 23 mg/dL   Creatinine, Ser 2.24 (H) 0.50 - 1.35 mg/dL   Calcium 8.5 8.4 - 10.5 mg/dL   Total Protein 5.7 (L) 6.0 - 8.3 g/dL   Albumin 3.5 3.5 - 5.2 g/dL   AST 45 (H) 0 - 37 U/L   ALT 24 0 - 53 U/L   Alkaline Phosphatase 115 39 - 117 U/L   Total Bilirubin 1.7 (H) 0.3 - 1.2 mg/dL   GFR calc non Af Amer 30 (L) >90 mL/min   GFR calc Af Amer 34 (L) >90 mL/min    Comment: (NOTE) The eGFR has been calculated using the CKD EPI equation. This calculation has not been validated in all clinical situations. eGFR's persistently <90 mL/min signify possible Chronic Kidney Disease.    Anion gap 13 5 - 15  Brain natriuretic peptide     Status: Abnormal   Collection Time: 11/17/14  9:31 AM  Result Value Ref Range   B Natriuretic Peptide 103.7 (H) 0.0 - 100.0 pg/mL  Lactic acid, plasma     Status: Abnormal   Collection Time: 11/17/14  9:36 AM  Result Value Ref Range   Lactic Acid, Venous 3.7 (HH) 0.5 - 2.0 mmol/L    Comment: REPEATED TO VERIFY CRITICAL RESULT CALLED TO, READ BACK BY AND VERIFIED WITH: D RICE,RN AT 1036 11/17/14 BY K BARR   TSH     Status: None   Collection Time: 11/17/14  9:36 AM  Result Value Ref Range   TSH 1.880 0.350 - 4.500 uIU/mL  I-Stat Troponin, ED (not at [facility])     Status: None   Collection Time: 11/17/14  9:41 AM  Result Value Ref Range   Troponin i, poc 0.00 0.00 - 0.08 ng/mL   Comment 3            Comment: Due to the release kinetics of cTnI, a negative result within the first hours of the onset of symptoms does not rule out myocardial infarction with certainty. If myocardial infarction is still suspected, repeat the test at appropriate intervals.   Urinalysis, Routine w reflex microscopic     Status: Abnormal   Collection Time: 11/17/14 11:35 AM  Result Value Ref Range   Color, Urine YELLOW YELLOW   APPearance CLEAR CLEAR    Specific Gravity, Urine 1.014 1.005 - 1.030   pH 5.0 5.0 - 8.0   Glucose, UA NEGATIVE NEGATIVE mg/dL   Hgb urine dipstick SMALL (A) NEGATIVE   Bilirubin Urine NEGATIVE NEGATIVE   Ketones, ur NEGATIVE NEGATIVE mg/dL   Protein, ur NEGATIVE NEGATIVE mg/dL   Urobilinogen, UA 0.2 0.0 - 1.0 mg/dL   Nitrite NEGATIVE NEGATIVE   Leukocytes, UA NEGATIVE NEGATIVE  Urine microscopic-add on     Status: None   Collection Time: 11/17/14 11:35 AM  Result Value Ref Range   Squamous Epithelial / LPF RARE RARE   WBC, UA 0-2 <3 WBC/hpf   RBC / HPF 0-2 <3 RBC/hpf   Bacteria, UA RARE RARE  Lactic acid, plasma     Status: None   Collection Time: 11/17/14 12:05 PM  Result Value Ref Range   Lactic Acid, Venous 1.5 0.5 - 2.0 mmol/L  Ammonia     Status: Abnormal   Collection Time: 11/17/14 12:05 PM  Result Value Ref Range   Ammonia 33 (H) 11 - 32 umol/L  I-Stat Venous Blood Gas, ED (order at [facility] and [facility] only)     Status: Abnormal   Collection Time: 11/17/14 12:15 PM  Result Value Ref Range   pH, Ven 7.285 7.250 - 7.300   pCO2, Ven 40.3 (L) 45.0 - 50.0 mmHg   pO2, Ven 29.0 (LL) 30.0 - 45.0 mmHg   Bicarbonate 19.1 (L) 20.0 - 24.0 mEq/L   TCO2 20 0 - 100 mmol/L   O2 Saturation 47.0 %   Acid-base deficit 7.0 (H) 0.0 - 2.0 mmol/L   Sample type VENOUS    Comment NOTIFIED PHYSICIAN   Ethanol     Status: None   Collection Time: 11/17/14 12:34 PM  Result Value Ref Range   Alcohol, Ethyl (B) <5 0 - 9 mg/dL    Comment:        LOWEST DETECTABLE LIMIT FOR SERUM ALCOHOL IS 11 mg/dL FOR MEDICAL PURPOSES ONLY     Ct Abdomen Pelvis Wo Contrast  11/17/2014   CLINICAL DATA:  Fall with pelvic and rib fractures. Renal insufficiency as  contraindication to intravenous contrast  EXAM: CT CHEST, ABDOMEN AND PELVIS WITHOUT CONTRAST  TECHNIQUE: Multidetector CT imaging of the chest, abdomen and pelvis was performed following the standard protocol without IV contrast.  COMPARISON:  None.  FINDINGS: CT CHEST FINDINGS   THORACIC INLET/BODY WALL:  No acute abnormality.  MEDIASTINUM:  Normal heart size. No pericardial effusion. Extensive atherosclerosis, including the coronary arteries, especially for age. No mediastinal hematoma or other evidence of vascular injury. Small sliding hiatal hernia.  LUNG WINDOWS:  No contusion, hemothorax, or pneumothorax.  Paraseptal and centrilobular emphysema.  OSSEOUS:  See below  CT ABDOMEN AND PELVIS FINDINGS  BODY WALL: Unremarkable.  Liver: Mildly dense liver parenchyma which improves visualization. There is no evidence of laceration or contusion. No perihepatic hematoma.  Biliary: No evidence of biliary obstruction or stone.  Pancreas: Unremarkable.  Spleen: Splenectomy.  Adrenals: Unremarkable.  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 Mild enlargement of the right kidney is likely compensatory hypertrophy. Bilateral nephrolithiasis, up to 4 mm in the left lower pole. There is a presumed cyst in the left kidney, 3 cm in diameter. No evidence for renal injury.  Bladder: No evidence of wall thickening to suggest injury. Left extraperitoneal fluid is high-density and consistent with hematoma rather than urine.  Reproductive: Unremarkable.  Bowel: No evidence of injury  Peritoneum: No free fluid or gas.  Vascular: No periaortic hematoma to suggest acute injury. There is extensive atherosclerosis with fusiform infrarenal ectasia at 25 mm. There is focal ectasia of the suprarenal aorta at 28 mm.  OSSEOUS: Displaced fracture of the supracondylar left humerus with extensive surrounding soft tissue swelling. As noted previously there is surrounding mineralization which could reflect subacute timing.  Mildly displaced left obturator ring fracture involving the inferior pubic ramus and the puboacetabular junction. There a vertical fracture of the left sacral ala, with transverse component at the level of S2. No displacement along the foramina. Moderate extraperitoneal pelvic hematoma  related to the fractures. Cannot evaluate for active hemorrhage without intravenous contrast.  Anteriorly dislocated sternoclavicular joint on the left. Chronic fragmentation of the joint is compatible with remote injury.  Bilateral rib fractures are healed or healing. No definite acute rib fractur.  L5 superior endplate deformity which appears chronic.  IMPRESSION: 1. Mildly displaced fractures of the left inferior pubic ramus and puboacetabular junction with mild to moderate extraperitoneal hematoma. 2. S3 body and left sacral ala fractures without displacement along the foramina. 3. Displaced left supracondylar humerus fracture. 4. No evidence of intrathoracic or intra-abdominal injury. The patient is status post splenectomy.   Electronically Signed   By: Jorje Guild M.D.   On: 11/17/2014 12:34   Dg Chest 2 View  11/17/2014   CLINICAL DATA:  Acute left-sided body pain after fall yesterday. Initial encounter.  EXAM: CHEST  2 VIEW  COMPARISON:  None.  FINDINGS: The heart size and mediastinal contours are within normal limits. Both lungs are clear. No pneumothorax or pleural effusion is noted. Old right rib fractures are noted. Mildly displaced acute fracture is seen involving lateral portion of left seventh rib.  IMPRESSION: Mildly displaced left seventh rib fracture. No acute cardiopulmonary abnormality seen.   Electronically Signed   By: Sabino Dick M.D.   On: 11/17/2014 10:31   Dg Pelvis 1-2 Views  11/17/2014   CLINICAL DATA:  Acute left pelvic pain after fall yesterday. Initial encounter.  EXAM: PELVIS - 1-2 VIEW  COMPARISON:  September 20, 2013.  FINDINGS: Mildly displaced fracture is  seen involving the left inferior pubic ramus. Mildly displaced medial left acetabular fracture is noted. These appear to be closed and posttraumatic. Sacroiliac joints appear normal.  IMPRESSION: Mildly displaced fractures are seen involving the medial left acetabulum and left inferior pubic ramus.   Electronically Signed    By: Sabino Dick M.D.   On: 11/17/2014 10:28   Dg Forearm Left  11/17/2014   CLINICAL DATA:  Fall on left-sided body with humerus pain and swelling. Initial encounter.  EXAM: LEFT FOREARM - 2 VIEW  COMPARISON:  None.  FINDINGS: Transverse fracture through the supracondylar humerus with marked anterior displacement. There is callus around the nonunited fracture, suggesting subacute injury. Diffuse surrounding soft tissue swelling. The elbow articulation is intact.  IMPRESSION: Subacute appearing displaced supracondylar humerus fracture.   Electronically Signed   By: Jorje Guild M.D.   On: 11/17/2014 10:27   Ct Head Wo Contrast  11/17/2014   CLINICAL DATA:  Acute right arm pain and head injury after fall today.  EXAM: CT HEAD WITHOUT CONTRAST  CT CERVICAL SPINE WITHOUT CONTRAST  TECHNIQUE: Multidetector CT imaging of the head and cervical spine was performed following the standard protocol without intravenous contrast. Multiplanar CT image reconstructions of the cervical spine were also generated.  COMPARISON:  CT scan of January 15, 2014.  FINDINGS: CT HEAD FINDINGS  Bony calvarium appears intact. Small right frontal scalp hematoma is noted. Mild diffuse cortical atrophy is noted. Mild chronic ischemic white matter disease is noted. No mass effect or midline shift is noted. Ventricular size is within normal limits. There is no evidence of mass lesion, hemorrhage or acute infarction.  CT CERVICAL SPINE FINDINGS  No fracture or spondylolisthesis is noted. Mild degenerative disc disease is noted at C5-6 and C6-7 with anterior and posterior osteophyte formation. Mild hypertrophy of right posterior facet joint at C4-5 is noted. Visualized lung apices appear normal.  IMPRESSION: Small right frontal scalp hematoma. Mild diffuse cortical atrophy. Mild chronic ischemic white matter disease. No acute intracranial abnormality seen.  Mild degenerative disc disease is noted at C5-6 and C6-7. No acute abnormality seen in  the cervical spine.   Electronically Signed   By: Sabino Dick M.D.   On: 11/17/2014 11:01   Ct Chest Wo Contrast  11/17/2014   CLINICAL DATA:  Fall with pelvic and rib fractures. Renal insufficiency as contraindication to intravenous contrast  EXAM: CT CHEST, ABDOMEN AND PELVIS WITHOUT CONTRAST  TECHNIQUE: Multidetector CT imaging of the chest, abdomen and pelvis was performed following the standard protocol without IV contrast.  COMPARISON:  None.  FINDINGS: CT CHEST FINDINGS  THORACIC INLET/BODY WALL:  No acute abnormality.  MEDIASTINUM:  Normal heart size. No pericardial effusion. Extensive atherosclerosis, including the coronary arteries, especially for age. No mediastinal hematoma or other evidence of vascular injury. Small sliding hiatal hernia.  LUNG WINDOWS:  No contusion, hemothorax, or pneumothorax.  Paraseptal and centrilobular emphysema.  OSSEOUS:  See below  CT ABDOMEN AND PELVIS FINDINGS  BODY WALL: Unremarkable.  Liver: Mildly dense liver parenchyma which improves visualization. There is no evidence of laceration or contusion. No perihepatic hematoma.  Biliary: No evidence of biliary obstruction or stone.  Pancreas: Unremarkable.  Spleen: Splenectomy.  Adrenals: Unremarkable.  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 Mild enlargement of the right kidney is likely compensatory hypertrophy. Bilateral nephrolithiasis, up to 4 mm in the left lower pole. There is a presumed cyst in the left kidney, 3 cm in diameter. No evidence for renal  injury.  Bladder: No evidence of wall thickening to suggest injury. Left extraperitoneal fluid is high-density and consistent with hematoma rather than urine.  Reproductive: Unremarkable.  Bowel: No evidence of injury  Peritoneum: No free fluid or gas.  Vascular: No periaortic hematoma to suggest acute injury. There is extensive atherosclerosis with fusiform infrarenal ectasia at 25 mm. There is focal ectasia of the suprarenal aorta at 28 mm.   OSSEOUS: Displaced fracture of the supracondylar left humerus with extensive surrounding soft tissue swelling. As noted previously there is surrounding mineralization which could reflect subacute timing.  Mildly displaced left obturator ring fracture involving the inferior pubic ramus and the puboacetabular junction. There a vertical fracture of the left sacral ala, with transverse component at the level of S2. No displacement along the foramina. Moderate extraperitoneal pelvic hematoma related to the fractures. Cannot evaluate for active hemorrhage without intravenous contrast.  Anteriorly dislocated sternoclavicular joint on the left. Chronic fragmentation of the joint is compatible with remote injury.  Bilateral rib fractures are healed or healing. No definite acute rib fractur.  L5 superior endplate deformity which appears chronic.  IMPRESSION: 1. Mildly displaced fractures of the left inferior pubic ramus and puboacetabular junction with mild to moderate extraperitoneal hematoma. 2. S3 body and left sacral ala fractures without displacement along the foramina. 3. Displaced left supracondylar humerus fracture. 4. No evidence of intrathoracic or intra-abdominal injury. The patient is status post splenectomy.   Electronically Signed   By: Jorje Guild M.D.   On: 11/17/2014 12:34   Ct Cervical Spine Wo Contrast  11/17/2014   CLINICAL DATA:  Acute right arm pain and head injury after fall today.  EXAM: CT HEAD WITHOUT CONTRAST  CT CERVICAL SPINE WITHOUT CONTRAST  TECHNIQUE: Multidetector CT imaging of the head and cervical spine was performed following the standard protocol without intravenous contrast. Multiplanar CT image reconstructions of the cervical spine were also generated.  COMPARISON:  CT scan of January 15, 2014.  FINDINGS: CT HEAD FINDINGS  Bony calvarium appears intact. Small right frontal scalp hematoma is noted. Mild diffuse cortical atrophy is noted. Mild chronic ischemic white matter disease is  noted. No mass effect or midline shift is noted. Ventricular size is within normal limits. There is no evidence of mass lesion, hemorrhage or acute infarction.  CT CERVICAL SPINE FINDINGS  No fracture or spondylolisthesis is noted. Mild degenerative disc disease is noted at C5-6 and C6-7 with anterior and posterior osteophyte formation. Mild hypertrophy of right posterior facet joint at C4-5 is noted. Visualized lung apices appear normal.  IMPRESSION: Small right frontal scalp hematoma. Mild diffuse cortical atrophy. Mild chronic ischemic white matter disease. No acute intracranial abnormality seen.  Mild degenerative disc disease is noted at C5-6 and C6-7. No acute abnormality seen in the cervical spine.   Electronically Signed   By: Sabino Dick M.D.   On: 11/17/2014 11:01   Dg Humerus Left  11/17/2014   CLINICAL DATA:  Status post fall 11/16/2014 with left upper arm pain.  EXAM: LEFT HUMERUS - 2+ VIEW  COMPARISON:  None.  FINDINGS: The patient has an acute transcondylar fracture of the distal humerus. No other acute bony or joint abnormality is identified.  IMPRESSION: Acute transcondylar fracture distal humerus.   Electronically Signed   By: Inge Rise M.D.   On: 11/17/2014 10:28   Dg Femur Min 2 Views Left  11/17/2014   CLINICAL DATA:  Status post fall 11/16/2014. Left pelvic and upper leg pain. Initial encounter.  EXAM: LEFT FEMUR 2 VIEWS  COMPARISON:  None.  FINDINGS: Acute left superior and inferior pubic ramus fractures are identified. No other acute bony or joint abnormality is seen. No notable degenerative disease about the hip or knee is identified. Atherosclerosis is noted.  IMPRESSION: Acute right superior and inferior pubic ramus fractures.   Electronically Signed   By: Inge Rise M.D.   On: 11/17/2014 10:29    Review of Systems  Constitutional: Negative.   HENT: Negative.   Eyes: Negative.   Respiratory: Negative.   Cardiovascular: Negative.   Gastrointestinal: Negative.    Genitourinary: Negative.   Musculoskeletal:       Left arm pain Left hip pain  Skin: Negative.   Neurological: Negative.   Endo/Heme/Allergies: Negative.   Psychiatric/Behavioral: Negative.    Blood pressure 134/78, pulse 105, temperature 97.5 F (36.4 C), temperature source Oral, resp. rate 12, height $RemoveBe'5\' 7"'[id number]$  (1.702 m), weight 74.844 kg (165 lb), SpO2 97 %. Physical Exam  Constitutional: He is oriented to person, place, and time. He appears well-developed and well-nourished.  HENT:  Head: Normocephalic.  Old Abrasion mid forehead   Eyes: EOM are normal.  Cardiovascular: Normal rate and intact distal pulses.   Respiratory: Effort normal.  Musculoskeletal:  Left elbow tenderness decreased ROM and pain with attempted ROM. Edema right elbow with significant ecchymosis.  Remaining upper extremity non tender. Abrasions of elbow no open puncture wounds.  Left hip pain with attempted ROM. Right hip no pain with ROM. Non tender over bilateral lower legs, calves supple non tender. Knees non tender bilateral. Ankles non tender. Dorsi/plantar flexion ankles intact.  Neurological: He is alert and oriented to person, place, and time.  Sensation grossly intact bilateral feet and hands to light touch.  Full motor of both hands.  Skin: Skin is warm and dry.  Ecchymosis bilateral upper extremities.   Psychiatric:  Answers questions appropriately with occasional inability to follow directions ? Secondary to pain meds.    Assessment/Plan: Mildly displaced fractures of the left inferior pubic ramus and puboacetabular junction - Weight bearing as tolerated  Displaced left supracondylar humerus fracture- Will require olecranon osteotomy fixation in future . Placed in long arm splint with sling for comfort. Wounds dressed with xerofrom. Left arm non weight bearing. Erskine Emery 11/17/2014, 4:49 PM

## 2014-11-17 NOTE — H&P (Signed)
Edward Singleton is an 64 y.o. male.   Chief Complaint: Fall HPI: Edward Singleton was at home when he tripped and fell yesterday afternoon. He denied losing consciousness but said he had to rest for about 2 hours afterwards. He got up this morning and attempted to go to work but hurt too bad and came to the ED for evaluation.  Past Medical History  Diagnosis Date  . Hypertension   . Hypercholesteremia     Past Surgical History  Procedure Laterality Date  . Nephrectomy      History reviewed. No pertinent family history. Social History:  reports that he has been smoking.  He does not have any smokeless tobacco history on file. He reports that he drinks alcohol. His drug history is not on file.  Allergies: No Known Allergies  Results for orders placed or performed during the hospital encounter of 11/17/14 (from the past 48 hour(s))  CBC with Differential     Status: Abnormal   Collection Time: 11/17/14  9:26 AM  Result Value Ref Range   WBC 18.7 (H) 4.0 - 10.5 K/uL   RBC 2.50 (L) 4.22 - 5.81 MIL/uL   Hemoglobin 9.2 (L) 13.0 - 17.0 g/dL   HCT 26.8 (L) 39.0 - 52.0 %   MCV 107.2 (H) 78.0 - 100.0 fL   MCH 36.8 (H) 26.0 - 34.0 pg   MCHC 34.3 30.0 - 36.0 g/dL   RDW 14.8 11.5 - 15.5 %   Platelets 285 150 - 400 K/uL   Neutrophils Relative % 89 (H) 43 - 77 %   Neutro Abs 16.6 (H) 1.7 - 7.7 K/uL   Lymphocytes Relative 2 (L) 12 - 46 %   Lymphs Abs 0.4 (L) 0.7 - 4.0 K/uL   Monocytes Relative 9 3 - 12 %   Monocytes Absolute 1.7 (H) 0.1 - 1.0 K/uL   Eosinophils Relative 0 0 - 5 %   Eosinophils Absolute 0.0 0.0 - 0.7 K/uL   Basophils Relative 0 0 - 1 %   Basophils Absolute 0.0 0.0 - 0.1 K/uL  Comprehensive metabolic panel     Status: Abnormal   Collection Time: 11/17/14  9:26 AM  Result Value Ref Range   Sodium 138 135 - 145 mmol/L   Potassium 5.1 3.5 - 5.1 mmol/L   Chloride 106 96 - 112 mmol/L   CO2 19 19 - 32 mmol/L   Glucose, Bld 121 (H) 70 - 99 mg/dL   BUN 21 6 - 23 mg/dL   Creatinine,  Ser 2.24 (H) 0.50 - 1.35 mg/dL   Calcium 8.5 8.4 - 10.5 mg/dL   Total Protein 5.7 (L) 6.0 - 8.3 g/dL   Albumin 3.5 3.5 - 5.2 g/dL   AST 45 (H) 0 - 37 U/L   ALT 24 0 - 53 U/L   Alkaline Phosphatase 115 39 - 117 U/L   Total Bilirubin 1.7 (H) 0.3 - 1.2 mg/dL   GFR calc non Af Amer 30 (L) >90 mL/min   GFR calc Af Amer 34 (L) >90 mL/min    Comment: (NOTE) The eGFR has been calculated using the CKD EPI equation. This calculation has not been validated in all clinical situations. eGFR's persistently <90 mL/min signify possible Chronic Kidney Disease.    Anion gap 13 5 - 15  Brain natriuretic peptide     Status: Abnormal   Collection Time: 11/17/14  9:31 AM  Result Value Ref Range   B Natriuretic Peptide 103.7 (H) 0.0 - 100.0 pg/mL  Lactic  acid, plasma     Status: Abnormal   Collection Time: 11/17/14  9:36 AM  Result Value Ref Range   Lactic Acid, Venous 3.7 (HH) 0.5 - 2.0 mmol/L    Comment: REPEATED TO VERIFY CRITICAL RESULT CALLED TO, READ BACK BY AND VERIFIED WITH: D RICE,RN AT 1036 11/17/14 BY K BARR   TSH     Status: None   Collection Time: 11/17/14  9:36 AM  Result Value Ref Range   TSH 1.880 0.350 - 4.500 uIU/mL  I-Stat Troponin, ED (not at [facility])     Status: None   Collection Time: 11/17/14  9:41 AM  Result Value Ref Range   Troponin i, poc 0.00 0.00 - 0.08 ng/mL   Comment 3            Comment: Due to the release kinetics of cTnI, a negative result within the first hours of the onset of symptoms does not rule out myocardial infarction with certainty. If myocardial infarction is still suspected, repeat the test at appropriate intervals.   Urinalysis, Routine w reflex microscopic     Status: Abnormal   Collection Time: 11/17/14 11:35 AM  Result Value Ref Range   Color, Urine YELLOW YELLOW   APPearance CLEAR CLEAR   Specific Gravity, Urine 1.014 1.005 - 1.030   pH 5.0 5.0 - 8.0   Glucose, UA NEGATIVE NEGATIVE mg/dL   Hgb urine dipstick SMALL (A) NEGATIVE   Bilirubin  Urine NEGATIVE NEGATIVE   Ketones, ur NEGATIVE NEGATIVE mg/dL   Protein, ur NEGATIVE NEGATIVE mg/dL   Urobilinogen, UA 0.2 0.0 - 1.0 mg/dL   Nitrite NEGATIVE NEGATIVE   Leukocytes, UA NEGATIVE NEGATIVE  Urine microscopic-add on     Status: None   Collection Time: 11/17/14 11:35 AM  Result Value Ref Range   Squamous Epithelial / LPF RARE RARE   WBC, UA 0-2 <3 WBC/hpf   RBC / HPF 0-2 <3 RBC/hpf   Bacteria, UA RARE RARE  I-Stat Venous Blood Gas, ED (order at [facility] and [facility] only)     Status: Abnormal   Collection Time: 11/17/14 12:15 PM  Result Value Ref Range   pH, Ven 7.285 7.250 - 7.300   pCO2, Ven 40.3 (L) 45.0 - 50.0 mmHg   pO2, Ven 29.0 (LL) 30.0 - 45.0 mmHg   Bicarbonate 19.1 (L) 20.0 - 24.0 mEq/L   TCO2 20 0 - 100 mmol/L   O2 Saturation 47.0 %   Acid-base deficit 7.0 (H) 0.0 - 2.0 mmol/L   Sample type VENOUS    Comment NOTIFIED PHYSICIAN    Ct Abdomen Pelvis Wo Contrast  11/17/2014   CLINICAL DATA:  Fall with pelvic and rib fractures. Renal insufficiency as contraindication to intravenous contrast  EXAM: CT CHEST, ABDOMEN AND PELVIS WITHOUT CONTRAST  TECHNIQUE: Multidetector CT imaging of the chest, abdomen and pelvis was performed following the standard protocol without IV contrast.  COMPARISON:  None.  FINDINGS: CT CHEST FINDINGS  THORACIC INLET/BODY WALL:  No acute abnormality.  MEDIASTINUM:  Normal heart size. No pericardial effusion. Extensive atherosclerosis, including the coronary arteries, especially for age. No mediastinal hematoma or other evidence of vascular injury. Small sliding hiatal hernia.  LUNG WINDOWS:  No contusion, hemothorax, or pneumothorax.  Paraseptal and centrilobular emphysema.  OSSEOUS:  See below  CT ABDOMEN AND PELVIS FINDINGS  BODY WALL: Unremarkable.  Liver: Mildly dense liver parenchyma which improves visualization. There is no evidence of laceration or contusion. No perihepatic hematoma.  Biliary: No evidence of biliary obstruction  or stone.  Pancreas:  Unremarkable.  Spleen: Splenectomy.  Adrenals: Unremarkable.  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 Mild enlargement of the right kidney is likely compensatory hypertrophy. Bilateral nephrolithiasis, up to 4 mm in the left lower pole. There is a presumed cyst in the left kidney, 3 cm in diameter. No evidence for renal injury.  Bladder: No evidence of wall thickening to suggest injury. Left extraperitoneal fluid is high-density and consistent with hematoma rather than urine.  Reproductive: Unremarkable.  Bowel: No evidence of injury  Peritoneum: No free fluid or gas.  Vascular: No periaortic hematoma to suggest acute injury. There is extensive atherosclerosis with fusiform infrarenal ectasia at 25 mm. There is focal ectasia of the suprarenal aorta at 28 mm.  OSSEOUS: Displaced fracture of the supracondylar left humerus with extensive surrounding soft tissue swelling. As noted previously there is surrounding mineralization which could reflect subacute timing.  Mildly displaced left obturator ring fracture involving the inferior pubic ramus and the puboacetabular junction. There a vertical fracture of the left sacral ala, with transverse component at the level of S2. No displacement along the foramina. Moderate extraperitoneal pelvic hematoma related to the fractures. Cannot evaluate for active hemorrhage without intravenous contrast.  Anteriorly dislocated sternoclavicular joint on the left. Chronic fragmentation of the joint is compatible with remote injury.  Bilateral rib fractures are healed or healing. No definite acute rib fractur.  L5 superior endplate deformity which appears chronic.  IMPRESSION: 1. Mildly displaced fractures of the left inferior pubic ramus and puboacetabular junction with mild to moderate extraperitoneal hematoma. 2. S3 body and left sacral ala fractures without displacement along the foramina. 3. Displaced left supracondylar humerus fracture. 4. No evidence of  intrathoracic or intra-abdominal injury. The patient is status post splenectomy.   Electronically Signed   By: Jorje Guild M.D.   On: 11/17/2014 12:34   Dg Chest 2 View  11/17/2014   CLINICAL DATA:  Acute left-sided body pain after fall yesterday. Initial encounter.  EXAM: CHEST  2 VIEW  COMPARISON:  None.  FINDINGS: The heart size and mediastinal contours are within normal limits. Both lungs are clear. No pneumothorax or pleural effusion is noted. Old right rib fractures are noted. Mildly displaced acute fracture is seen involving lateral portion of left seventh rib.  IMPRESSION: Mildly displaced left seventh rib fracture. No acute cardiopulmonary abnormality seen.   Electronically Signed   By: Sabino Dick M.D.   On: 11/17/2014 10:31   Dg Pelvis 1-2 Views  11/17/2014   CLINICAL DATA:  Acute left pelvic pain after fall yesterday. Initial encounter.  EXAM: PELVIS - 1-2 VIEW  COMPARISON:  September 20, 2013.  FINDINGS: Mildly displaced fracture is seen involving the left inferior pubic ramus. Mildly displaced medial left acetabular fracture is noted. These appear to be closed and posttraumatic. Sacroiliac joints appear normal.  IMPRESSION: Mildly displaced fractures are seen involving the medial left acetabulum and left inferior pubic ramus.   Electronically Signed   By: Sabino Dick M.D.   On: 11/17/2014 10:28   Dg Forearm Left  11/17/2014   CLINICAL DATA:  Fall on left-sided body with humerus pain and swelling. Initial encounter.  EXAM: LEFT FOREARM - 2 VIEW  COMPARISON:  None.  FINDINGS: Transverse fracture through the supracondylar humerus with marked anterior displacement. There is callus around the nonunited fracture, suggesting subacute injury. Diffuse surrounding soft tissue swelling. The elbow articulation is intact.  IMPRESSION: Subacute appearing displaced supracondylar humerus fracture.   Electronically Signed  By: Jorje Guild M.D.   On: 11/17/2014 10:27   Ct Head Wo Contrast  11/17/2014    CLINICAL DATA:  Acute right arm pain and head injury after fall today.  EXAM: CT HEAD WITHOUT CONTRAST  CT CERVICAL SPINE WITHOUT CONTRAST  TECHNIQUE: Multidetector CT imaging of the head and cervical spine was performed following the standard protocol without intravenous contrast. Multiplanar CT image reconstructions of the cervical spine were also generated.  COMPARISON:  CT scan of January 15, 2014.  FINDINGS: CT HEAD FINDINGS  Bony calvarium appears intact. Small right frontal scalp hematoma is noted. Mild diffuse cortical atrophy is noted. Mild chronic ischemic white matter disease is noted. No mass effect or midline shift is noted. Ventricular size is within normal limits. There is no evidence of mass lesion, hemorrhage or acute infarction.  CT CERVICAL SPINE FINDINGS  No fracture or spondylolisthesis is noted. Mild degenerative disc disease is noted at C5-6 and C6-7 with anterior and posterior osteophyte formation. Mild hypertrophy of right posterior facet joint at C4-5 is noted. Visualized lung apices appear normal.  IMPRESSION: Small right frontal scalp hematoma. Mild diffuse cortical atrophy. Mild chronic ischemic white matter disease. No acute intracranial abnormality seen.  Mild degenerative disc disease is noted at C5-6 and C6-7. No acute abnormality seen in the cervical spine.   Electronically Signed   By: Sabino Dick M.D.   On: 11/17/2014 11:01   Dg Humerus Left  11/17/2014   CLINICAL DATA:  Status post fall 11/16/2014 with left upper arm pain.  EXAM: LEFT HUMERUS - 2+ VIEW  COMPARISON:  None.  FINDINGS: The patient has an acute transcondylar fracture of the distal humerus. No other acute bony or joint abnormality is identified.  IMPRESSION: Acute transcondylar fracture distal humerus.   Electronically Signed   By: Inge Rise M.D.   On: 11/17/2014 10:28   Dg Femur Min 2 Views Left  11/17/2014   CLINICAL DATA:  Status post fall 11/16/2014. Left pelvic and upper leg pain. Initial encounter.   EXAM: LEFT FEMUR 2 VIEWS  COMPARISON:  None.  FINDINGS: Acute left superior and inferior pubic ramus fractures are identified. No other acute bony or joint abnormality is seen. No notable degenerative disease about the hip or knee is identified. Atherosclerosis is noted.  IMPRESSION: Acute right superior and inferior pubic ramus fractures.   Electronically Signed   By: Inge Rise M.D.   On: 11/17/2014 10:29    Review of Systems  Constitutional: Negative for weight loss.  HENT: Negative for ear discharge, ear pain, hearing loss and tinnitus.   Eyes: Negative for blurred vision, double vision, photophobia and pain.  Respiratory: Negative for cough, sputum production and shortness of breath.   Cardiovascular: Positive for chest pain (Left chest wall).  Gastrointestinal: Negative for nausea, vomiting and abdominal pain.  Genitourinary: Negative for dysuria, urgency, frequency and flank pain.  Musculoskeletal: Positive for joint pain (Left elbow) and falls. Negative for myalgias, back pain and neck pain.  Neurological: Negative for dizziness, tingling, sensory change, focal weakness, loss of consciousness and headaches.  Endo/Heme/Allergies: Does not bruise/bleed easily.  Psychiatric/Behavioral: Negative for depression, memory loss and substance abuse. The patient is not nervous/anxious.     Blood pressure 142/93, pulse 95, temperature 97.3 F (36.3 C), resp. rate 18, height _0  (1.702 m), weight 165 lb (74.844 kg), SpO2 96 %. Physical Exam  Vitals reviewed. Constitutional: He appears well-developed and well-nourished. He is cooperative. No distress. Cervical collar and nasal cannula  in place.  HENT:  Head: Normocephalic and atraumatic. Head is without raccoon's eyes, without Battle's sign, without abrasion, without contusion and without laceration.  Right Ear: Hearing, tympanic membrane, external ear and ear canal normal. No lacerations. No drainage or tenderness. No foreign bodies.  Tympanic membrane is not perforated. No hemotympanum.  Left Ear: Hearing, tympanic membrane, external ear and ear canal normal. No lacerations. No drainage or tenderness. No foreign bodies. Tympanic membrane is not perforated. No hemotympanum.  Nose: Nose normal. No nose lacerations, sinus tenderness, nasal deformity or nasal septal hematoma. No epistaxis.  Mouth/Throat: Uvula is midline, oropharynx is clear and moist and mucous membranes are normal. No lacerations. No oropharyngeal exudate.  Eyes: Conjunctivae, EOM and lids are normal. Pupils are equal, round, and reactive to light. Right eye exhibits no discharge. Left eye exhibits no discharge. No scleral icterus.  Neck: Trachea normal and normal range of motion. Neck supple. No JVD present. No spinous process tenderness and no muscular tenderness present. Carotid bruit is not present. No tracheal deviation present. No thyromegaly present.  Cardiovascular: Normal rate, regular rhythm, normal heart sounds, intact distal pulses and normal pulses.  Exam reveals no gallop and no friction rub.   No murmur heard. Respiratory: Effort normal and breath sounds normal. No stridor. No respiratory distress. He has no wheezes. He has no rales. He exhibits tenderness. He exhibits no bony tenderness, no laceration and no crepitus.  GI: Soft. Normal appearance and bowel sounds are normal. He exhibits no distension. There is no tenderness. There is no rigidity, no rebound, no guarding and no CVA tenderness.  Genitourinary: Penis normal.  Musculoskeletal: Normal range of motion. He exhibits no edema.       Left elbow: He exhibits swelling. Tenderness found.  Lymphadenopathy:    He has no cervical adenopathy.  Neurological: He is alert. He has normal strength. He displays tremor. No cranial nerve deficit or sensory deficit. GCS eye subscore is 4. GCS verbal subscore is 5. GCS motor subscore is 6.  Skin: Skin is warm, dry and intact. He is not diaphoretic.   Psychiatric: He has a normal mood and affect. His speech is normal and behavior is normal.     Assessment/Plan Fall Multiple left rib fxs -- Pulmonary toilet Left distal humerus fx -- Will need delayed ORIF per Dr. Ninfa Linden Right sup/inf pubic rami fxs/left sacral ala fx -- ? If acetabular involvement, ortho to evaluate Anemia -- Will get IM to work up; last hgb was 11+ 4/15 Acute on chronic kidney disease -- Will get IM to work up; last cr was 1.7 4/15 Daily EtOH use -- CIWA Illicit benzodiazepine use -- Family mentioned. I spoke with his PCP Dr. Raechel Ache at [facility] in [place] who confirmed. Asplenism  Admit to trauma service, ICU    Lisette Abu, PA-C Pager: [phone] General Trauma PA Pager: [phone] 11/17/2014, 12:45 PM

## 2014-11-17 NOTE — ED Notes (Signed)
Patient transported to CT 

## 2014-11-17 NOTE — ED Notes (Signed)
Ortho tech at bedside 

## 2014-11-17 NOTE — ED Notes (Signed)
Cleansed pt's wound and placed bacitracin on them. Dressed wounds with nonadherant dressing and gauze.

## 2014-11-17 NOTE — ED Notes (Signed)
Pt has redness/abrasion to bilateral knees

## 2014-11-17 NOTE — ED Notes (Signed)
Ortho MDs at bedside.

## 2014-11-17 NOTE — Progress Notes (Signed)
Orthopedic Tech Progress Note Patient Details:  Edward Singleton 1951/01/12 [id number] Applied fiberglass long arm splint to LUE.  Pulses, sensation, motion intact before and after splinting.  Capillary refill less than 2 seconds before and after splinting.  Placed splinted LUE in arm sling. Ortho Devices Type of Ortho Device: Long arm splint, Arm sling Ortho Device/Splint Location: LUE Ortho Device/Splint Interventions: Application   Darrol Poke 11/17/2014, 1:26 PM

## 2014-11-17 NOTE — ED Notes (Signed)
CRITICAL VALUE ALERT  Critical value received:  Lactic Acid 2  Date of notification:  11/17/14  Time of notification:  [id number]  Critical value read back:Yes.    Nurse who received alert:  Wilson Singer   MD notified (1st page):  [place]

## 2014-11-17 NOTE — ED Notes (Signed)
RN attempted to draw labs. Unsuccessful. Will notify phlebotomy

## 2014-11-17 NOTE — Progress Notes (Signed)
CIWA scores have been elevated at 14 on Med-Surg protocol; have adjusted to more frequent dosing utilizing the SDU protocol. If worsens will require Precedex and PCCM consultation  Luana Shu

## 2014-11-17 NOTE — ED Provider Notes (Signed)
CSN: [id number]     Arrival date & time 11/17/14  [id number] History   First MD Initiated Contact with Patient 11/17/14 [phone]     Chief Complaint  Patient presents with  . Fall  . Weakness     (Consider location/radiation/quality/duration/timing/severity/associated sxs/prior Treatment) Patient is a 64 y.o. male presenting with fall and weakness. The history is provided by the patient. No language interpreter was used.  Fall This is a new problem. The current episode started yesterday. The problem occurs constantly. The problem has been unchanged. Associated symptoms include weakness (generalized). Pertinent negatives include no abdominal pain, chest pain, chills, coughing, fever, nausea, neck pain, numbness or vomiting. Nothing aggravates the symptoms. He has tried nothing for the symptoms. The treatment provided moderate relief.  Weakness This is a new problem. The current episode started more than 1 month ago. The problem occurs constantly. The problem has been unchanged. Associated symptoms include weakness (generalized). Pertinent negatives include no abdominal pain, chest pain, chills, coughing, fever, nausea, neck pain, numbness or vomiting. Nothing aggravates the symptoms. He has tried nothing for the symptoms.    Past Medical History  Diagnosis Date  . Hypertension   . Hypercholesteremia    Past Surgical History  Procedure Laterality Date  . Nephrectomy     History reviewed. No pertinent family history. History  Substance Use Topics  . Smoking status: Current Every Day Smoker  . Smokeless tobacco: Not on file  . Alcohol Use: Yes    Review of Systems  Constitutional: Negative for fever and chills.  Respiratory: Negative for cough.   Cardiovascular: Negative for chest pain.  Gastrointestinal: Negative for nausea, vomiting, abdominal pain and diarrhea.  Genitourinary: Negative for dysuria, urgency and frequency.  Musculoskeletal: Negative for back pain and neck pain.   Neurological: Positive for weakness (generalized). Negative for numbness.  All other systems reviewed and are negative.     Allergies  Review of patient's allergies indicates no known allergies.  Home Medications   Prior to Admission medications   Medication Sig Start Date End Date Taking? Authorizing Provider  allopurinol (ZYLOPRIM) 300 MG tablet Take 150 mg by mouth daily.    Yes Historical Provider, MD  oxyCODONE-acetaminophen (PERCOCET/ROXICET) 5-325 MG per tablet Take 2 tablets by mouth every 4 (four) hours as needed for pain. Patient not taking: Reported on 11/17/2014 08/05/13   Shaune Pollack, MD  simvastatin (ZOCOR) 40 MG tablet Take 40 mg by mouth daily. 11/15/14  Yes Historical Provider, MD   BP 134/78 mmHg  Pulse 105  Temp(Src) 97.5 F (36.4 C) (Oral)  Resp 12  Ht 5\' 7"  (1.702 m)  Wt 165 lb (74.844 kg)  BMI 25.84 kg/m2  SpO2 96% Physical Exam  Constitutional: He is oriented to person, place, and time. He appears well-developed and well-nourished. No distress.  HENT:  Head: Normocephalic. Head is with abrasion and with contusion.  Right Ear: Tympanic membrane normal.  Left Ear: Tympanic membrane normal.  Nose: No nasal deformity, septal deviation or nasal septal hematoma.  Abrasion and hematoma of the R forehead.    Eyes: Pupils are equal, round, and reactive to light. Right eye exhibits normal extraocular motion. Left eye exhibits normal extraocular motion. Right pupil is reactive. Left pupil is reactive.  Neck: Normal range of motion. No spinous process tenderness and no muscular tenderness present.  Cardiovascular: Normal rate, regular rhythm and normal heart sounds.   Pulmonary/Chest: Effort normal and breath sounds normal. No respiratory distress. He has no decreased breath sounds.  He has no wheezes. He has no rhonchi. He has no rales. He exhibits no tenderness, no laceration and no deformity.  Abdominal: Soft. Normal appearance. He exhibits no distension. There is  no tenderness. There is no rigidity, no rebound and no guarding.  Musculoskeletal: He exhibits no edema.       Left elbow: He exhibits swelling. He exhibits no deformity. Tenderness found.       Left hip: He exhibits tenderness. He exhibits no swelling, no crepitus and no deformity.       Cervical back: He exhibits no tenderness, no bony tenderness, no deformity, no laceration and no pain.       Thoracic back: He exhibits no tenderness, no bony tenderness, no swelling, no deformity and no laceration.       Lumbar back: He exhibits no tenderness, no bony tenderness, no deformity and no laceration.  Neurological: He is alert and oriented to person, place, and time. He has normal strength. No cranial nerve deficit or sensory deficit. He exhibits normal muscle tone.  Skin: Skin is warm and dry. He is not diaphoretic.  Multiple ecchymoses and skin tears to bilateral upper extremities.    Nursing note and vitals reviewed.   ED Course  Procedures (including critical care time) Labs Review Labs Reviewed  CBC WITH DIFFERENTIAL/PLATELET - Abnormal; Notable for the following:    WBC 18.7 (*)    RBC 2.50 (*)    Hemoglobin 9.2 (*)    HCT 26.8 (*)    MCV 107.2 (*)    MCH 36.8 (*)    Neutrophils Relative % 89 (*)    Neutro Abs 16.6 (*)    Lymphocytes Relative 2 (*)    Lymphs Abs 0.4 (*)    Monocytes Absolute 1.7 (*)    All other components within normal limits  COMPREHENSIVE METABOLIC PANEL - Abnormal; Notable for the following:    Glucose, Bld 121 (*)    Creatinine, Ser 2.24 (*)    Total Protein 5.7 (*)    AST 45 (*)    Total Bilirubin 1.7 (*)    GFR calc non Af Amer 30 (*)    GFR calc Af Amer 34 (*)    All other components within normal limits  LACTIC ACID, PLASMA - Abnormal; Notable for the following:    Lactic Acid, Venous 3.7 (*)    All other components within normal limits  URINALYSIS, ROUTINE W REFLEX MICROSCOPIC - Abnormal; Notable for the following:    Hgb urine dipstick SMALL  (*)    All other components within normal limits  BRAIN NATRIURETIC PEPTIDE - Abnormal; Notable for the following:    B Natriuretic Peptide 103.7 (*)    All other components within normal limits  AMMONIA - Abnormal; Notable for the following:    Ammonia 33 (*)    All other components within normal limits  I-STAT VENOUS BLOOD GAS, ED - Abnormal; Notable for the following:    pCO2, Ven 40.3 (*)    pO2, Ven 29.0 (*)    Bicarbonate 19.1 (*)    Acid-base deficit 7.0 (*)    All other components within normal limits  MRSA PCR SCREENING  LACTIC ACID, PLASMA  TSH  ETHANOL  URINE MICROSCOPIC-ADD ON  VITAMIN B12  FOLATE  IRON AND TIBC  FERRITIN  RETICULOCYTES  I-STAT TROPOININ, ED    Imaging Review Ct Abdomen Pelvis Wo Contrast  11/17/2014   CLINICAL DATA:  Fall with pelvic and rib fractures. Renal insufficiency as contraindication  to intravenous contrast  EXAM: CT CHEST, ABDOMEN AND PELVIS WITHOUT CONTRAST  TECHNIQUE: Multidetector CT imaging of the chest, abdomen and pelvis was performed following the standard protocol without IV contrast.  COMPARISON:  None.  FINDINGS: CT CHEST FINDINGS  THORACIC INLET/BODY WALL:  No acute abnormality.  MEDIASTINUM:  Normal heart size. No pericardial effusion. Extensive atherosclerosis, including the coronary arteries, especially for age. No mediastinal hematoma or other evidence of vascular injury. Small sliding hiatal hernia.  LUNG WINDOWS:  No contusion, hemothorax, or pneumothorax.  Paraseptal and centrilobular emphysema.  OSSEOUS:  See below  CT ABDOMEN AND PELVIS FINDINGS  BODY WALL: Unremarkable.  Liver: Mildly dense liver parenchyma which improves visualization. There is no evidence of laceration or contusion. No perihepatic hematoma.  Biliary: No evidence of biliary obstruction or stone.  Pancreas: Unremarkable.  Spleen: Splenectomy.  Adrenals: Unremarkable.  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 Mild enlargement of the right  kidney is likely compensatory hypertrophy. Bilateral nephrolithiasis, up to 4 mm in the left lower pole. There is a presumed cyst in the left kidney, 3 cm in diameter. No evidence for renal injury.  Bladder: No evidence of wall thickening to suggest injury. Left extraperitoneal fluid is high-density and consistent with hematoma rather than urine.  Reproductive: Unremarkable.  Bowel: No evidence of injury  Peritoneum: No free fluid or gas.  Vascular: No periaortic hematoma to suggest acute injury. There is extensive atherosclerosis with fusiform infrarenal ectasia at 25 mm. There is focal ectasia of the suprarenal aorta at 28 mm.  OSSEOUS: Displaced fracture of the supracondylar left humerus with extensive surrounding soft tissue swelling. As noted previously there is surrounding mineralization which could reflect subacute timing.  Mildly displaced left obturator ring fracture involving the inferior pubic ramus and the puboacetabular junction. There a vertical fracture of the left sacral ala, with transverse component at the level of S2. No displacement along the foramina. Moderate extraperitoneal pelvic hematoma related to the fractures. Cannot evaluate for active hemorrhage without intravenous contrast.  Anteriorly dislocated sternoclavicular joint on the left. Chronic fragmentation of the joint is compatible with remote injury.  Bilateral rib fractures are healed or healing. No definite acute rib fractur.  L5 superior endplate deformity which appears chronic.  IMPRESSION: 1. Mildly displaced fractures of the left inferior pubic ramus and puboacetabular junction with mild to moderate extraperitoneal hematoma. 2. S3 body and left sacral ala fractures without displacement along the foramina. 3. Displaced left supracondylar humerus fracture. 4. No evidence of intrathoracic or intra-abdominal injury. The patient is status post splenectomy.   Electronically Signed   By: Jorje Guild M.D.   On: 11/17/2014 12:34   Dg  Chest 2 View  11/17/2014   CLINICAL DATA:  Acute left-sided body pain after fall yesterday. Initial encounter.  EXAM: CHEST  2 VIEW  COMPARISON:  None.  FINDINGS: The heart size and mediastinal contours are within normal limits. Both lungs are clear. No pneumothorax or pleural effusion is noted. Old right rib fractures are noted. Mildly displaced acute fracture is seen involving lateral portion of left seventh rib.  IMPRESSION: Mildly displaced left seventh rib fracture. No acute cardiopulmonary abnormality seen.   Electronically Signed   By: Sabino Dick M.D.   On: 11/17/2014 10:31   Dg Pelvis 1-2 Views  11/17/2014   CLINICAL DATA:  Acute left pelvic pain after fall yesterday. Initial encounter.  EXAM: PELVIS - 1-2 VIEW  COMPARISON:  September 20, 2013.  FINDINGS: Mildly displaced fracture is seen  involving the left inferior pubic ramus. Mildly displaced medial left acetabular fracture is noted. These appear to be closed and posttraumatic. Sacroiliac joints appear normal.  IMPRESSION: Mildly displaced fractures are seen involving the medial left acetabulum and left inferior pubic ramus.   Electronically Signed   By: Sabino Dick M.D.   On: 11/17/2014 10:28   Dg Forearm Left  11/17/2014   CLINICAL DATA:  Fall on left-sided body with humerus pain and swelling. Initial encounter.  EXAM: LEFT FOREARM - 2 VIEW  COMPARISON:  None.  FINDINGS: Transverse fracture through the supracondylar humerus with marked anterior displacement. There is callus around the nonunited fracture, suggesting subacute injury. Diffuse surrounding soft tissue swelling. The elbow articulation is intact.  IMPRESSION: Subacute appearing displaced supracondylar humerus fracture.   Electronically Signed   By: Jorje Guild M.D.   On: 11/17/2014 10:27   Ct Head Wo Contrast  11/17/2014   CLINICAL DATA:  Acute right arm pain and head injury after fall today.  EXAM: CT HEAD WITHOUT CONTRAST  CT CERVICAL SPINE WITHOUT CONTRAST  TECHNIQUE:  Multidetector CT imaging of the head and cervical spine was performed following the standard protocol without intravenous contrast. Multiplanar CT image reconstructions of the cervical spine were also generated.  COMPARISON:  CT scan of January 15, 2014.  FINDINGS: CT HEAD FINDINGS  Bony calvarium appears intact. Small right frontal scalp hematoma is noted. Mild diffuse cortical atrophy is noted. Mild chronic ischemic white matter disease is noted. No mass effect or midline shift is noted. Ventricular size is within normal limits. There is no evidence of mass lesion, hemorrhage or acute infarction.  CT CERVICAL SPINE FINDINGS  No fracture or spondylolisthesis is noted. Mild degenerative disc disease is noted at C5-6 and C6-7 with anterior and posterior osteophyte formation. Mild hypertrophy of right posterior facet joint at C4-5 is noted. Visualized lung apices appear normal.  IMPRESSION: Small right frontal scalp hematoma. Mild diffuse cortical atrophy. Mild chronic ischemic white matter disease. No acute intracranial abnormality seen.  Mild degenerative disc disease is noted at C5-6 and C6-7. No acute abnormality seen in the cervical spine.   Electronically Signed   By: Sabino Dick M.D.   On: 11/17/2014 11:01   Ct Chest Wo Contrast  11/17/2014   CLINICAL DATA:  Fall with pelvic and rib fractures. Renal insufficiency as contraindication to intravenous contrast  EXAM: CT CHEST, ABDOMEN AND PELVIS WITHOUT CONTRAST  TECHNIQUE: Multidetector CT imaging of the chest, abdomen and pelvis was performed following the standard protocol without IV contrast.  COMPARISON:  None.  FINDINGS: CT CHEST FINDINGS  THORACIC INLET/BODY WALL:  No acute abnormality.  MEDIASTINUM:  Normal heart size. No pericardial effusion. Extensive atherosclerosis, including the coronary arteries, especially for age. No mediastinal hematoma or other evidence of vascular injury. Small sliding hiatal hernia.  LUNG WINDOWS:  No contusion, hemothorax, or  pneumothorax.  Paraseptal and centrilobular emphysema.  OSSEOUS:  See below  CT ABDOMEN AND PELVIS FINDINGS  BODY WALL: Unremarkable.  Liver: Mildly dense liver parenchyma which improves visualization. There is no evidence of laceration or contusion. No perihepatic hematoma.  Biliary: No evidence of biliary obstruction or stone.  Pancreas: Unremarkable.  Spleen: Splenectomy.  Adrenals: Unremarkable.  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 Mild enlargement of the right kidney is likely compensatory hypertrophy. Bilateral nephrolithiasis, up to 4 mm in the left lower pole. There is a presumed cyst in the left kidney, 3 cm in diameter. No evidence for renal injury.  Bladder: No evidence of wall thickening to suggest injury. Left extraperitoneal fluid is high-density and consistent with hematoma rather than urine.  Reproductive: Unremarkable.  Bowel: No evidence of injury  Peritoneum: No free fluid or gas.  Vascular: No periaortic hematoma to suggest acute injury. There is extensive atherosclerosis with fusiform infrarenal ectasia at 25 mm. There is focal ectasia of the suprarenal aorta at 28 mm.  OSSEOUS: Displaced fracture of the supracondylar left humerus with extensive surrounding soft tissue swelling. As noted previously there is surrounding mineralization which could reflect subacute timing.  Mildly displaced left obturator ring fracture involving the inferior pubic ramus and the puboacetabular junction. There a vertical fracture of the left sacral ala, with transverse component at the level of S2. No displacement along the foramina. Moderate extraperitoneal pelvic hematoma related to the fractures. Cannot evaluate for active hemorrhage without intravenous contrast.  Anteriorly dislocated sternoclavicular joint on the left. Chronic fragmentation of the joint is compatible with remote injury.  Bilateral rib fractures are healed or healing. No definite acute rib fractur.  L5 superior endplate  deformity which appears chronic.  IMPRESSION: 1. Mildly displaced fractures of the left inferior pubic ramus and puboacetabular junction with mild to moderate extraperitoneal hematoma. 2. S3 body and left sacral ala fractures without displacement along the foramina. 3. Displaced left supracondylar humerus fracture. 4. No evidence of intrathoracic or intra-abdominal injury. The patient is status post splenectomy.   Electronically Signed   By: Jorje Guild M.D.   On: 11/17/2014 12:34   Ct Cervical Spine Wo Contrast  11/17/2014   CLINICAL DATA:  Acute right arm pain and head injury after fall today.  EXAM: CT HEAD WITHOUT CONTRAST  CT CERVICAL SPINE WITHOUT CONTRAST  TECHNIQUE: Multidetector CT imaging of the head and cervical spine was performed following the standard protocol without intravenous contrast. Multiplanar CT image reconstructions of the cervical spine were also generated.  COMPARISON:  CT scan of January 15, 2014.  FINDINGS: CT HEAD FINDINGS  Bony calvarium appears intact. Small right frontal scalp hematoma is noted. Mild diffuse cortical atrophy is noted. Mild chronic ischemic white matter disease is noted. No mass effect or midline shift is noted. Ventricular size is within normal limits. There is no evidence of mass lesion, hemorrhage or acute infarction.  CT CERVICAL SPINE FINDINGS  No fracture or spondylolisthesis is noted. Mild degenerative disc disease is noted at C5-6 and C6-7 with anterior and posterior osteophyte formation. Mild hypertrophy of right posterior facet joint at C4-5 is noted. Visualized lung apices appear normal.  IMPRESSION: Small right frontal scalp hematoma. Mild diffuse cortical atrophy. Mild chronic ischemic white matter disease. No acute intracranial abnormality seen.  Mild degenerative disc disease is noted at C5-6 and C6-7. No acute abnormality seen in the cervical spine.   Electronically Signed   By: Sabino Dick M.D.   On: 11/17/2014 11:01   Dg Humerus Left  11/17/2014    CLINICAL DATA:  Status post fall 11/16/2014 with left upper arm pain.  EXAM: LEFT HUMERUS - 2+ VIEW  COMPARISON:  None.  FINDINGS: The patient has an acute transcondylar fracture of the distal humerus. No other acute bony or joint abnormality is identified.  IMPRESSION: Acute transcondylar fracture distal humerus.   Electronically Signed   By: Inge Rise M.D.   On: 11/17/2014 10:28   Dg Femur Min 2 Views Left  11/17/2014   CLINICAL DATA:  Status post fall 11/16/2014. Left pelvic and upper leg pain. Initial encounter.  EXAM: LEFT  FEMUR 2 VIEWS  COMPARISON:  None.  FINDINGS: Acute left superior and inferior pubic ramus fractures are identified. No other acute bony or joint abnormality is seen. No notable degenerative disease about the hip or knee is identified. Atherosclerosis is noted.  IMPRESSION: Acute right superior and inferior pubic ramus fractures.   Electronically Signed   By: Inge Rise M.D.   On: 11/17/2014 10:29     EKG Interpretation   Date/Time:  Monday November 17 2014 08:54:49 EST Ventricular Rate:  103 PR Interval:    QRS Duration: 102 QT Interval:  356 QTC Calculation: 466 R Axis:   72 Text Interpretation:  Atrial fibrillation Low voltage, precordial leads No  prior for comparison Confirmed by DOCHERTY  MD, MEGAN ([id number]) on 11/17/2014  9:02:36 AM      MDM   Final diagnoses:  Fall  Fall, initial encounter  Pelvic fracture, closed, initial encounter  Humerus fracture, left, closed, initial encounter  Traumatic hematoma of head, initial encounter   Patient is a 64 year old Caucasian male with unknown past medical history who comes to the emergency department today feeling poorly. Physical exam as above. Patient reportedly fell yesterday down approximately 3 steps. Patient does report to have been drinking yesterday prior to the fall. Patient attempted to go to work today but felt so poorly that he came to the emergency department. With significant ecchymosis to  his left arm, pain to the arm, head injury, and pain to his pelvis he was felt to require x-rays of the left arm, pelvis, CT of the head, and a CT of the C-spine. These demonstrated a fracture of the left humerus and of the left inferior and superior pubic ramus. Orthopedics was consulted for the patient's humerus fracture and a CT chest abdomen pelvis were obtained. Attempted to obtain these with contrast however the patient has a creatinine of 2.24 as result this had to be obtained without contrast. This was discussed with radiology who felt they would be able to see any major complications.  The remainder of the workup included a CBC, CMP, BNP, lactic acid, TSH, UA, pneumonia, alcohol, and a VBG. The lactic acid was 3.7. Patient was treated with 2 L of normal saline and a repeat lactic acid was obtained. CMP with a creatinine of 2.2 we have no previous creatinines for comparison otherwise unremarkable. CBC with a hemoglobin of 9.4 no previous CBC's for comparison. CT of the chest abdomen pelvis demonstrated no other traumatic findings. Patient was felt to require admission to the hospital with altered mental status and multiple injuries. His care was discussed with trauma who agreed with the plan. Patient was admitted to the trauma service in good condition. Labs and imaging reviewed by myself and considered in medical decision making. Imaging interpreted by Radiology. Care was discussed with my attending Dr. Tawnya Crook.       Katheren Shams, MD 11/17/14 1527  Ernestina Patches, MD 11/20/14 2134

## 2014-11-17 NOTE — ED Notes (Addendum)
Pt was at work this morning and feeling weak since his BP meds have recently been increased. Pt fell at home yesterday- pt states he never passed out, he just got weak and fell. Pt is alert and oriented. BP 121/88, HR 108, CBG 126. EKG unremarkable per EMS.

## 2014-11-17 NOTE — Consult Note (Signed)
[facility] Consultation note                                                                                    Edward Singleton, is a 64 y.o. male  MRN: [id number]   DOB - [birth year]  Admit Date - 11/17/2014   Requesting physician: Dr. Georganna Skeans  Outpatient Primary MD for the patient is Ezequiel Kayser, MD  Reason for consultation: Medical management of trauma patient  With History of -  Past Medical History  Diagnosis Date  . Hypertension   . Hypercholesteremia       Past Surgical History  Procedure Laterality Date  . Nephrectomy      in for   Chief Complaint  Patient presents with  . Fall  . Weakness     HPI Edward Singleton  is a 64 y.o. male, with history of hypertension dyslipidemia and gout as well as polysubstance abuse primarily involving alcohol and benzodiazepines. Patient presented to the ER after he had experienced a fall at home which he reported as mechanical. He was found to have multiple fractures and was admitted by the trauma service. Patient has a primary care physician in the [place] area who he has not seen in nearly a year. He reports he is supposed to be taking medication for blood pressure but does not. He does take his Zocor appropriately. In the ER he was hemodynamically stable except for mild tachycardia. He was requiring nasal cannula oxygen. His creatinine was elevated at 2.24 and baseline creatinine unknown. He was also found to have leukocytosis as well as anemia with baseline hemoglobin unknown. Because of multiple medical problems with poor nonadherence prior to admission and increased risk for going to substance abuse withdrawal medical consultation was requested. Patient has been sedated and kept falling asleep during the exam so review of systems was quite difficult. He did report that he has gout symptoms daily. He denied any significant problems prior to the fall.  Review of Systems   In addition to the HPI above,  No Fever-chills,  myalgias or other constitutional symptoms No Headache, changes with Vision or hearing, new weakness, tingling, numbness in any extremity, No problems swallowing food or Liquids, indigestion/reflux No Chest pain, Cough or Shortness of Breath, palpitations, orthopnea or DOE No Abdominal pain, N/V; no melena or hematochezia, no dark tarry stools, Bowel movements are regular, No dysuria, hematuria or flank pain No new skin rashes, lesions, masses or bruises, No new joints pains-aches No recent weight gain or loss No polyuria, polydypsia or polyphagia,  *A full 10 point Review of Systems was done, except as stated above, all other Review of Systems were negative.  Social History History  Substance Use Topics  . Smoking status: Current Every Day Smoker at least one half pack per day   . Smokeless tobacco: None  . Alcohol Use: Yes-daily at least several beers but patient unable to adequately quantify     Family History Father with MI Mother with MI  Prior to Admission medications   Medication Sig Start Date End Date Taking? Authorizing Provider  allopurinol (ZYLOPRIM) 300 MG tablet Take 150 mg by mouth daily.  Yes Historical Provider, MD  oxyCODONE-acetaminophen (PERCOCET/ROXICET) 5-325 MG per tablet Take 2 tablets by mouth every 4 (four) hours as needed for pain. Patient not taking: Reported on 11/17/2014 08/05/13   Shaune Pollack, MD  simvastatin (ZOCOR) 40 MG tablet Take 40 mg by mouth daily. 11/15/14  Yes Historical Provider, MD    No Known Allergies  Physical Exam  Vitals  Blood pressure 134/78, pulse 105, temperature 97.5 F (36.4 C), temperature source Oral, resp. rate 12, height 5\' 7"  (1.702 m), weight 165 lb (74.844 kg), SpO2 96 %.   General:  In no acute respiratory distress distress, sedated with pain medications and having difficulty staying awake or in the history portion of the exam  Psych:  Sedated with pain medications, appears oriented.  Neuro:   No focal  neurological deficits, CN II through XII intact, Strength 5/5 all 4 extremities except for left upper extremity which was limited noting acute humeral fracture and arm is in cast and sling, Sensation intact all 4 extremities.  ENT:  Ears and Eyes appear Normal, Conjunctivae clear, PERL, dry oral mucosa without erythema or exudates.  Neck:  Supple, No lymphadenopathy appreciated  Respiratory:  Symmetrical chest wall movement, Good air movement bilaterally, CTAB. Room Air  Cardiac:  RRR, No Murmurs, no LE edema noted, no JVD, No carotid bruits, peripheral pulses palpable at 2+  Abdomen:  Positive bowel sounds, Soft, Non tender, Non distended,  No masses appreciated, no obvious hepatosplenomegaly  Skin:  No Cyanosis, Normal Skin Turgor, No Skin Rash or Bruise.  Extremities: Symmetrical except for previously mentioned left upper extremity fracture/sling and cast,  no effusions.  Data Review  CBC  Recent Labs Lab 11/17/14 0926  WBC 18.7*  HGB 9.2*  HCT 26.8*  PLT 285  MCV 107.2*  MCH 36.8*  MCHC 34.3  RDW 14.8  LYMPHSABS 0.4*  MONOABS 1.7*  EOSABS 0.0  BASOSABS 0.0    Chemistries   Recent Labs Lab 11/17/14 0926  NA 138  K 5.1  CL 106  CO2 19  GLUCOSE 121*  BUN 21  CREATININE 2.24*  CALCIUM 8.5  AST 45*  ALT 24  ALKPHOS 115  BILITOT 1.7*    estimated creatinine clearance is 32 mL/min (by C-G formula based on Cr of 2.24).   Recent Labs  11/17/14 0936  TSH 1.880    Coagulation profile No results for input(s): INR, PROTIME in the last 168 hours.  No results for input(s): DDIMER in the last 72 hours.  Cardiac Enzymes No results for input(s): CKMB, TROPONINI, MYOGLOBIN in the last 168 hours.  Invalid input(s): CK  Invalid input(s): POCBNP  Urinalysis    Component Value Date/Time   COLORURINE YELLOW 11/17/2014 [place] 11/17/2014 1135   LABSPEC 1.014 11/17/2014 1135   PHURINE 5.0 11/17/2014 1135   GLUCOSEU NEGATIVE 11/17/2014  1135   HGBUR SMALL* 11/17/2014 1135   BILIRUBINUR NEGATIVE 11/17/2014 1135   KETONESUR NEGATIVE 11/17/2014 1135   PROTEINUR NEGATIVE 11/17/2014 1135   UROBILINOGEN 0.2 11/17/2014 1135   NITRITE NEGATIVE 11/17/2014 1135   LEUKOCYTESUR NEGATIVE 11/17/2014 1135    Imaging results:   Ct Abdomen Pelvis Wo Contrast  11/17/2014   CLINICAL DATA:  Fall with pelvic and rib fractures. Renal insufficiency as contraindication to intravenous contrast  EXAM: CT CHEST, ABDOMEN AND PELVIS WITHOUT CONTRAST  TECHNIQUE: Multidetector CT imaging of the chest, abdomen and pelvis was performed following the standard protocol without IV contrast.  COMPARISON:  None.  FINDINGS:  CT CHEST FINDINGS  THORACIC INLET/BODY WALL:  No acute abnormality.  MEDIASTINUM:  Normal heart size. No pericardial effusion. Extensive atherosclerosis, including the coronary arteries, especially for age. No mediastinal hematoma or other evidence of vascular injury. Small sliding hiatal hernia.  LUNG WINDOWS:  No contusion, hemothorax, or pneumothorax.  Paraseptal and centrilobular emphysema.  OSSEOUS:  See below  CT ABDOMEN AND PELVIS FINDINGS  BODY WALL: Unremarkable.  Liver: Mildly dense liver parenchyma which improves visualization. There is no evidence of laceration or contusion. No perihepatic hematoma.  Biliary: No evidence of biliary obstruction or stone.  Pancreas: Unremarkable.  Spleen: Splenectomy.  Adrenals: Unremarkable.  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 Mild enlargement of the right kidney is likely compensatory hypertrophy. Bilateral nephrolithiasis, up to 4 mm in the left lower pole. There is a presumed cyst in the left kidney, 3 cm in diameter. No evidence for renal injury.  Bladder: No evidence of wall thickening to suggest injury. Left extraperitoneal fluid is high-density and consistent with hematoma rather than urine.  Reproductive: Unremarkable.  Bowel: No evidence of injury  Peritoneum: No free fluid  or gas.  Vascular: No periaortic hematoma to suggest acute injury. There is extensive atherosclerosis with fusiform infrarenal ectasia at 25 mm. There is focal ectasia of the suprarenal aorta at 28 mm.  OSSEOUS: Displaced fracture of the supracondylar left humerus with extensive surrounding soft tissue swelling. As noted previously there is surrounding mineralization which could reflect subacute timing.  Mildly displaced left obturator ring fracture involving the inferior pubic ramus and the puboacetabular junction. There a vertical fracture of the left sacral ala, with transverse component at the level of S2. No displacement along the foramina. Moderate extraperitoneal pelvic hematoma related to the fractures. Cannot evaluate for active hemorrhage without intravenous contrast.  Anteriorly dislocated sternoclavicular joint on the left. Chronic fragmentation of the joint is compatible with remote injury.  Bilateral rib fractures are healed or healing. No definite acute rib fractur.  L5 superior endplate deformity which appears chronic.  IMPRESSION: 1. Mildly displaced fractures of the left inferior pubic ramus and puboacetabular junction with mild to moderate extraperitoneal hematoma. 2. S3 body and left sacral ala fractures without displacement along the foramina. 3. Displaced left supracondylar humerus fracture. 4. No evidence of intrathoracic or intra-abdominal injury. The patient is status post splenectomy.   Electronically Signed   By: Jorje Guild M.D.   On: 11/17/2014 12:34   Dg Chest 2 View  11/17/2014   CLINICAL DATA:  Acute left-sided body pain after fall yesterday. Initial encounter.  EXAM: CHEST  2 VIEW  COMPARISON:  None.  FINDINGS: The heart size and mediastinal contours are within normal limits. Both lungs are clear. No pneumothorax or pleural effusion is noted. Old right rib fractures are noted. Mildly displaced acute fracture is seen involving lateral portion of left seventh rib.  IMPRESSION:  Mildly displaced left seventh rib fracture. No acute cardiopulmonary abnormality seen.   Electronically Signed   By: Sabino Dick M.D.   On: 11/17/2014 10:31   Dg Pelvis 1-2 Views  11/17/2014   CLINICAL DATA:  Acute left pelvic pain after fall yesterday. Initial encounter.  EXAM: PELVIS - 1-2 VIEW  COMPARISON:  September 20, 2013.  FINDINGS: Mildly displaced fracture is seen involving the left inferior pubic ramus. Mildly displaced medial left acetabular fracture is noted. These appear to be closed and posttraumatic. Sacroiliac joints appear normal.  IMPRESSION: Mildly displaced fractures are seen involving the medial left acetabulum and  left inferior pubic ramus.   Electronically Signed   By: Sabino Dick M.D.   On: 11/17/2014 10:28   Dg Forearm Left  11/17/2014   CLINICAL DATA:  Fall on left-sided body with humerus pain and swelling. Initial encounter.  EXAM: LEFT FOREARM - 2 VIEW  COMPARISON:  None.  FINDINGS: Transverse fracture through the supracondylar humerus with marked anterior displacement. There is callus around the nonunited fracture, suggesting subacute injury. Diffuse surrounding soft tissue swelling. The elbow articulation is intact.  IMPRESSION: Subacute appearing displaced supracondylar humerus fracture.   Electronically Signed   By: Jorje Guild M.D.   On: 11/17/2014 10:27   Ct Head Wo Contrast  11/17/2014   CLINICAL DATA:  Acute right arm pain and head injury after fall today.  EXAM: CT HEAD WITHOUT CONTRAST  CT CERVICAL SPINE WITHOUT CONTRAST  TECHNIQUE: Multidetector CT imaging of the head and cervical spine was performed following the standard protocol without intravenous contrast. Multiplanar CT image reconstructions of the cervical spine were also generated.  COMPARISON:  CT scan of January 15, 2014.  FINDINGS: CT HEAD FINDINGS  Bony calvarium appears intact. Small right frontal scalp hematoma is noted. Mild diffuse cortical atrophy is noted. Mild chronic ischemic white matter disease is  noted. No mass effect or midline shift is noted. Ventricular size is within normal limits. There is no evidence of mass lesion, hemorrhage or acute infarction.  CT CERVICAL SPINE FINDINGS  No fracture or spondylolisthesis is noted. Mild degenerative disc disease is noted at C5-6 and C6-7 with anterior and posterior osteophyte formation. Mild hypertrophy of right posterior facet joint at C4-5 is noted. Visualized lung apices appear normal.  IMPRESSION: Small right frontal scalp hematoma. Mild diffuse cortical atrophy. Mild chronic ischemic white matter disease. No acute intracranial abnormality seen.  Mild degenerative disc disease is noted at C5-6 and C6-7. No acute abnormality seen in the cervical spine.   Electronically Signed   By: Sabino Dick M.D.   On: 11/17/2014 11:01   Ct Chest Wo Contrast  11/17/2014   CLINICAL DATA:  Fall with pelvic and rib fractures. Renal insufficiency as contraindication to intravenous contrast  EXAM: CT CHEST, ABDOMEN AND PELVIS WITHOUT CONTRAST  TECHNIQUE: Multidetector CT imaging of the chest, abdomen and pelvis was performed following the standard protocol without IV contrast.  COMPARISON:  None.  FINDINGS: CT CHEST FINDINGS  THORACIC INLET/BODY WALL:  No acute abnormality.  MEDIASTINUM:  Normal heart size. No pericardial effusion. Extensive atherosclerosis, including the coronary arteries, especially for age. No mediastinal hematoma or other evidence of vascular injury. Small sliding hiatal hernia.  LUNG WINDOWS:  No contusion, hemothorax, or pneumothorax.  Paraseptal and centrilobular emphysema.  OSSEOUS:  See below  CT ABDOMEN AND PELVIS FINDINGS  BODY WALL: Unremarkable.  Liver: Mildly dense liver parenchyma which improves visualization. There is no evidence of laceration or contusion. No perihepatic hematoma.  Biliary: No evidence of biliary obstruction or stone.  Pancreas: Unremarkable.  Spleen: Splenectomy.  Adrenals: Unremarkable.  Kidneys and ureters: Small, likely  nonfunctional left kidney. Mild enlargement of the right kidney is likely compensatory hypertrophy. Bilateral nephrolithiasis, up to 4 mm in the left lower pole. There is a presumed cyst in the left kidney, 3 cm in diameter. No evidence for renal injury.  Bladder: No evidence of wall thickening to suggest injury. Left extraperitoneal fluid is high-density and consistent with hematoma rather than urine.  Reproductive: Unremarkable.  Bowel: No evidence of injury  Peritoneum: No free fluid or gas.  Vascular: No periaortic hematoma to suggest acute injury. There is extensive atherosclerosis with fusiform infrarenal ectasia at 25 mm. There is focal ectasia of the suprarenal aorta at 28 mm.  OSSEOUS: Displaced fracture of the supracondylar left humerus with extensive surrounding soft tissue swelling. As noted previously there is surrounding mineralization which could reflect subacute timing.  Mildly displaced left obturator ring fracture involving the inferior pubic ramus and the puboacetabular junction. There a vertical fracture of the left sacral ala, with transverse component at the level of S2. No displacement along the foramina. Moderate extraperitoneal pelvic hematoma related to the fractures. Cannot evaluate for active hemorrhage without intravenous contrast.  Anteriorly dislocated sternoclavicular joint on the left. Chronic fragmentation of the joint is compatible with remote injury.  Bilateral rib fractures are healed or healing. No definite acute rib fractur.  L5 superior endplate deformity which appears chronic.  IMPRESSION: 1. Mildly displaced fractures of the left inferior pubic ramus and puboacetabular junction with mild to moderate extraperitoneal hematoma. 2. S3 body and left sacral ala fractures without displacement along the foramina. 3. Displaced left supracondylar humerus fracture. 4. No evidence of intrathoracic or intra-abdominal injury. The patient is status post splenectomy.   Electronically Signed    By: Jorje Guild M.D.   On: 11/17/2014 12:34   Ct Cervical Spine Wo Contrast  11/17/2014   CLINICAL DATA:  Acute right arm pain and head injury after fall today.  EXAM: CT HEAD WITHOUT CONTRAST  CT CERVICAL SPINE WITHOUT CONTRAST  TECHNIQUE: Multidetector CT imaging of the head and cervical spine was performed following the standard protocol without intravenous contrast. Multiplanar CT image reconstructions of the cervical spine were also generated.  COMPARISON:  CT scan of January 15, 2014.  FINDINGS: CT HEAD FINDINGS  Bony calvarium appears intact. Small right frontal scalp hematoma is noted. Mild diffuse cortical atrophy is noted. Mild chronic ischemic white matter disease is noted. No mass effect or midline shift is noted. Ventricular size is within normal limits. There is no evidence of mass lesion, hemorrhage or acute infarction.  CT CERVICAL SPINE FINDINGS  No fracture or spondylolisthesis is noted. Mild degenerative disc disease is noted at C5-6 and C6-7 with anterior and posterior osteophyte formation. Mild hypertrophy of right posterior facet joint at C4-5 is noted. Visualized lung apices appear normal.  IMPRESSION: Small right frontal scalp hematoma. Mild diffuse cortical atrophy. Mild chronic ischemic white matter disease. No acute intracranial abnormality seen.  Mild degenerative disc disease is noted at C5-6 and C6-7. No acute abnormality seen in the cervical spine.   Electronically Signed   By: Sabino Dick M.D.   On: 11/17/2014 11:01   Dg Humerus Left  11/17/2014   CLINICAL DATA:  Status post fall 11/16/2014 with left upper arm pain.  EXAM: LEFT HUMERUS - 2+ VIEW  COMPARISON:  None.  FINDINGS: The patient has an acute transcondylar fracture of the distal humerus. No other acute bony or joint abnormality is identified.  IMPRESSION: Acute transcondylar fracture distal humerus.   Electronically Signed   By: Inge Rise M.D.   On: 11/17/2014 10:28   Dg Femur Min 2 Views Left  11/17/2014    CLINICAL DATA:  Status post fall 11/16/2014. Left pelvic and upper leg pain. Initial encounter.  EXAM: LEFT FEMUR 2 VIEWS  COMPARISON:  None.  FINDINGS: Acute left superior and inferior pubic ramus fractures are identified. No other acute bony or joint abnormality is seen. No notable degenerative disease about the hip or knee is  identified. Atherosclerosis is noted.  IMPRESSION: Acute right superior and inferior pubic ramus fractures.   Electronically Signed   By: Inge Rise M.D.   On: 11/17/2014 10:29     EKG: Sinus tachycardia, QTC 481 ms   Assessment & Plan  Principal Problem:   Fall with multiple fractures -Management per primary service/trauma team  Active Problems:   Acute on PRESUMED chronic kidney failure -Baseline renal function unknown -At presentation renal function 21/2.24 -Avoid offending medications such as ACE inhibitor -Agree with IV fluid hydration -Follow labs -If renal function worsens consider renal ultrasound    Benzodiazepine abuse/Alcohol abuse -Patient unable to quantify actual daily use and patterns; suspect he may be minimizing use as well -Agree with current CIWA; need to monitor for severe withdrawal -If current CIWA ineffective we try to utilize in combination with Librium and if that still ineffective would consult PCCM for consideration of Precedex therapy    HTN -Current blood pressure controlled -Avoid ACE inhibitor as above -If blood pressure increases can use either Lopressor or Norvasc -Check 2-D echocardiogram    Macrocytic anemia -Likely from ongoing alcohol abuse -Agree with TSH -Check anemia panel    Dyslipidemia -Continue Zocor -Check fasting lipid panel    History of nonadherence to medical treatment -Once more alert need to determine exactly where patient resides in whether he needs to establish with a primary care physician; likely will need case management referral    DVT Prophylaxis: Lovenox  Family Communication:    No family at bedside  Code Status:  Full  Condition:  Guarded  Time spent in minutes : 60   ELLIS,ALLISON L. ANP on 11/17/2014 at 3:58 PM  Between 7am to 7pm - Pager - [phone]  After 7pm go to [email or web address] - password [email or web address]  And look for the night coverage person covering me after hours  [facility]

## 2014-11-18 ENCOUNTER — Inpatient Hospital Stay (HOSPITAL_COMMUNITY): Payer: 59

## 2014-11-18 ENCOUNTER — Encounter (HOSPITAL_COMMUNITY): Admission: EM | Disposition: A | Discharge status: Rehabilitation Facility | Payer: Self-pay | Source: Home / Self Care

## 2014-11-18 ENCOUNTER — Inpatient Hospital Stay (HOSPITAL_COMMUNITY): Payer: 59 | Admitting: Anesthesiology

## 2014-11-18 ENCOUNTER — Encounter (HOSPITAL_COMMUNITY): Payer: Self-pay | Admitting: Orthopedic Surgery

## 2014-11-18 DIAGNOSIS — S3282XA Multiple fractures of pelvis without disruption of pelvic ring, initial encounter for closed fracture: Secondary | ICD-10-CM | POA: Diagnosis present

## 2014-11-18 DIAGNOSIS — S42402A Unspecified fracture of lower end of left humerus, initial encounter for closed fracture: Secondary | ICD-10-CM | POA: Diagnosis present

## 2014-11-18 HISTORY — PX: ORIF HUMERUS FRACTURE: SHX2126

## 2014-11-18 LAB — ABO/RH: ABO/RH(D): A POS

## 2014-11-18 LAB — COMPREHENSIVE METABOLIC PANEL
ALBUMIN: 2.6 g/dL — AB (ref 3.5–5.2)
ALK PHOS: 83 U/L (ref 39–117)
ALT: 17 U/L (ref 0–53)
AST: 41 U/L — ABNORMAL HIGH (ref 0–37)
Anion gap: 9 (ref 5–15)
BILIRUBIN TOTAL: 0.8 mg/dL (ref 0.3–1.2)
BUN: 20 mg/dL (ref 6–23)
CO2: 18 mmol/L — AB (ref 19–32)
Calcium: 7.9 mg/dL — ABNORMAL LOW (ref 8.4–10.5)
Chloride: 109 mmol/L (ref 96–112)
Creatinine, Ser: 1.86 mg/dL — ABNORMAL HIGH (ref 0.50–1.35)
GFR calc non Af Amer: 37 mL/min — ABNORMAL LOW (ref >90)
GFR, EST AFRICAN AMERICAN: 43 mL/min — AB (ref >90)
Glucose, Bld: 147 mg/dL — ABNORMAL HIGH (ref 70–99)
POTASSIUM: 4.2 mmol/L (ref 3.5–5.1)
SODIUM: 136 mmol/L (ref 135–145)
Total Protein: 4.5 g/dL — ABNORMAL LOW (ref 6.0–8.3)

## 2014-11-18 LAB — CBC
HCT: 29.2 % — ABNORMAL LOW (ref 39.0–52.0)
HEMATOCRIT: 21.5 % — AB (ref 39.0–52.0)
HEMOGLOBIN: 10.1 g/dL — AB (ref 13.0–17.0)
Hemoglobin: 7.3 g/dL — ABNORMAL LOW (ref 13.0–17.0)
MCH: 36.9 pg — ABNORMAL HIGH (ref 26.0–34.0)
MCH: 34.4 pg — AB (ref 26.0–34.0)
MCHC: 34 g/dL (ref 30.0–36.0)
MCHC: 34.6 g/dL (ref 30.0–36.0)
MCV: 108.6 fL — AB (ref 78.0–100.0)
MCV: 99.3 fL (ref 78.0–100.0)
PLATELETS: 229 10*3/uL (ref 150–400)
Platelets: 202 10*3/uL (ref 150–400)
RBC: 1.98 MIL/uL — AB (ref 4.22–5.81)
RBC: 2.94 MIL/uL — ABNORMAL LOW (ref 4.22–5.81)
RDW: 15 % (ref 11.5–15.5)
RDW: 20.5 % — ABNORMAL HIGH (ref 11.5–15.5)
WBC: 14.9 10*3/uL — ABNORMAL HIGH (ref 4.0–10.5)
WBC: 15.9 10*3/uL — ABNORMAL HIGH (ref 4.0–10.5)

## 2014-11-18 LAB — IRON AND TIBC
Iron: 43 ug/dL (ref 42–165)
Saturation Ratios: 22 % (ref 20–55)
TIBC: 199 ug/dL — AB (ref 215–435)
UIBC: 156 ug/dL (ref 125–400)

## 2014-11-18 LAB — LIPID PANEL
Cholesterol: 90 mg/dL (ref 0–200)
HDL: 54 mg/dL (ref >39)
LDL CALC: 23 mg/dL (ref 0–99)
Total CHOL/HDL Ratio: 1.7 RATIO
Triglycerides: 63 mg/dL (ref <150)
VLDL: 13 mg/dL (ref 0–40)

## 2014-11-18 LAB — RAPID URINE DRUG SCREEN, HOSP PERFORMED
Amphetamines: NOT DETECTED
BARBITURATES: NOT DETECTED
Benzodiazepines: POSITIVE — AB
Cocaine: NOT DETECTED
OPIATES: POSITIVE — AB
TETRAHYDROCANNABINOL: NOT DETECTED

## 2014-11-18 LAB — FOLATE: Folate: 6.5 ng/mL

## 2014-11-18 LAB — PREPARE RBC (CROSSMATCH)

## 2014-11-18 LAB — AMMONIA: Ammonia: 25 umol/L (ref 11–32)

## 2014-11-18 LAB — VITAMIN B12: Vitamin B-12: 300 pg/mL (ref 211–911)

## 2014-11-18 LAB — FERRITIN: Ferritin: 662 ng/mL — ABNORMAL HIGH (ref 22–322)

## 2014-11-18 LAB — LACTIC ACID, PLASMA: Lactic Acid, Venous: 1.5 mmol/L (ref 0.5–2.0)

## 2014-11-18 SURGERY — OPEN REDUCTION INTERNAL FIXATION (ORIF) DISTAL HUMERUS FRACTURE
Anesthesia: General | Laterality: Left

## 2014-11-18 MED ORDER — PHENYLEPHRINE 40 MCG/ML (10ML) SYRINGE FOR IV PUSH (FOR BLOOD PRESSURE SUPPORT)
PREFILLED_SYRINGE | INTRAVENOUS | Status: AC
  Filled 2014-11-18: qty 20

## 2014-11-18 MED ORDER — PROPOFOL 10 MG/ML IV BOLUS
INTRAVENOUS | Status: DC | PRN
  Administered 2014-11-18: 100 mg via INTRAVENOUS

## 2014-11-18 MED ORDER — NEOSTIGMINE METHYLSULFATE 10 MG/10ML IV SOLN
INTRAVENOUS | Status: AC
  Filled 2014-11-18: qty 1

## 2014-11-18 MED ORDER — FENTANYL CITRATE 0.05 MG/ML IJ SOLN
25.0000 ug/h | INTRAMUSCULAR | Status: DC
  Administered 2014-11-18: 25 ug/h via INTRAVENOUS
  Filled 2014-11-18: qty 50

## 2014-11-18 MED ORDER — LACTATED RINGERS IV SOLN
INTRAVENOUS | Status: DC | PRN
  Administered 2014-11-18 (×2): via INTRAVENOUS

## 2014-11-18 MED ORDER — FENTANYL CITRATE 0.05 MG/ML IJ SOLN
INTRAMUSCULAR | Status: DC | PRN
  Administered 2014-11-18 (×3): 50 ug via INTRAVENOUS

## 2014-11-18 MED ORDER — 0.9 % SODIUM CHLORIDE (POUR BTL) OPTIME
TOPICAL | Status: DC | PRN
  Administered 2014-11-18: 1000 mL

## 2014-11-18 MED ORDER — HYDROMORPHONE HCL 1 MG/ML IJ SOLN: 0.2500 mg | INTRAMUSCULAR | Status: DC | PRN

## 2014-11-18 MED ORDER — CEFAZOLIN SODIUM-DEXTROSE 2-3 GM-% IV SOLR
2.0000 g | Freq: Three times a day (TID) | INTRAVENOUS | Status: AC
  Administered 2014-11-18 – 2014-11-19 (×3): 2 g via INTRAVENOUS
  Filled 2014-11-18 (×3): qty 50

## 2014-11-18 MED ORDER — CETYLPYRIDINIUM CHLORIDE 0.05 % MT LIQD
7.0000 mL | Freq: Two times a day (BID) | OROMUCOSAL | Status: DC
  Administered 2014-11-18: 7 mL via OROMUCOSAL

## 2014-11-18 MED ORDER — FENTANYL CITRATE 0.05 MG/ML IJ SOLN
INTRAMUSCULAR | Status: AC
  Filled 2014-11-18: qty 5

## 2014-11-18 MED ORDER — GLYCOPYRROLATE 0.2 MG/ML IJ SOLN
INTRAMUSCULAR | Status: AC
  Filled 2014-11-18: qty 3

## 2014-11-18 MED ORDER — DEXMEDETOMIDINE HCL IN NACL 200 MCG/50ML IV SOLN
0.4000 ug/kg/h | INTRAVENOUS | Status: DC
  Administered 2014-11-19: 0.3 ug/kg/h via INTRAVENOUS
  Filled 2014-11-18 (×2): qty 50

## 2014-11-18 MED ORDER — MIDAZOLAM HCL 2 MG/2ML IJ SOLN
INTRAMUSCULAR | Status: AC
  Filled 2014-11-18: qty 2

## 2014-11-18 MED ORDER — ROCURONIUM BROMIDE 100 MG/10ML IV SOLN
INTRAVENOUS | Status: DC | PRN
  Administered 2014-11-18: 10 mg via INTRAVENOUS
  Administered 2014-11-18: 50 mg via INTRAVENOUS
  Administered 2014-11-18: 20 mg via INTRAVENOUS

## 2014-11-18 MED ORDER — ONDANSETRON HCL 4 MG/2ML IJ SOLN
INTRAMUSCULAR | Status: DC | PRN
  Administered 2014-11-18: 4 mg via INTRAVENOUS

## 2014-11-18 MED ORDER — DEXMEDETOMIDINE HCL IN NACL 200 MCG/50ML IV SOLN
INTRAVENOUS | Status: DC | PRN
  Administered 2014-11-18: 0.7 ug/kg/h via INTRAVENOUS

## 2014-11-18 MED ORDER — LIDOCAINE HCL (CARDIAC) 20 MG/ML IV SOLN
INTRAVENOUS | Status: DC | PRN
  Administered 2014-11-18: 20 mg via INTRAVENOUS

## 2014-11-18 MED ORDER — CEFAZOLIN SODIUM 1-5 GM-% IV SOLN
1.0000 g | Freq: Three times a day (TID) | INTRAVENOUS | Status: DC
  Administered 2014-11-18: 2 g via INTRAVENOUS
  Administered 2014-11-18: 1 g via INTRAVENOUS
  Filled 2014-11-18 (×4): qty 50

## 2014-11-18 MED ORDER — CHLORHEXIDINE GLUCONATE 0.12 % MT SOLN
15.0000 mL | Freq: Two times a day (BID) | OROMUCOSAL | Status: DC
  Administered 2014-11-19 (×2): 15 mL via OROMUCOSAL
  Filled 2014-11-18: qty 15

## 2014-11-18 MED ORDER — SODIUM CHLORIDE 0.9 % IV SOLN
10.0000 mg | INTRAVENOUS | Status: DC | PRN
  Administered 2014-11-18: 25 ug/min via INTRAVENOUS

## 2014-11-18 MED ORDER — CEFAZOLIN SODIUM-DEXTROSE 2-3 GM-% IV SOLR
INTRAVENOUS | Status: AC
  Filled 2014-11-18: qty 50

## 2014-11-18 MED ORDER — NEOSTIGMINE METHYLSULFATE 10 MG/10ML IV SOLN
INTRAVENOUS | Status: DC | PRN
  Administered 2014-11-18: 4 mg via INTRAVENOUS

## 2014-11-18 MED ORDER — MIDAZOLAM HCL 5 MG/5ML IJ SOLN
INTRAMUSCULAR | Status: DC | PRN
  Administered 2014-11-18: 2 mg via INTRAVENOUS

## 2014-11-18 MED ORDER — DEXMEDETOMIDINE HCL IN NACL 200 MCG/50ML IV SOLN
INTRAVENOUS | Status: AC
  Filled 2014-11-18: qty 50

## 2014-11-18 MED ORDER — CETYLPYRIDINIUM CHLORIDE 0.05 % MT LIQD
7.0000 mL | Freq: Four times a day (QID) | OROMUCOSAL | Status: DC
  Administered 2014-11-18 – 2014-11-19 (×4): 7 mL via OROMUCOSAL

## 2014-11-18 MED ORDER — SODIUM CHLORIDE 0.9 % IV SOLN
Freq: Once | INTRAVENOUS | Status: AC
  Administered 2014-11-18: 13:00:00 via INTRAVENOUS

## 2014-11-18 MED ORDER — PROPOFOL 10 MG/ML IV BOLUS
INTRAVENOUS | Status: AC
  Filled 2014-11-18: qty 20

## 2014-11-18 MED ORDER — LACTATED RINGERS IV SOLN
INTRAVENOUS | Status: DC
  Administered 2014-11-18 – 2014-11-24 (×2): via INTRAVENOUS

## 2014-11-18 MED ORDER — GLYCOPYRROLATE 0.2 MG/ML IJ SOLN
INTRAMUSCULAR | Status: DC | PRN
  Administered 2014-11-18: 0.6 mg via INTRAVENOUS

## 2014-11-18 MED ORDER — CHLORHEXIDINE GLUCONATE 0.12 % MT SOLN
15.0000 mL | Freq: Two times a day (BID) | OROMUCOSAL | Status: DC
  Administered 2014-11-18 (×2): 15 mL via OROMUCOSAL
  Filled 2014-11-18 (×2): qty 15

## 2014-11-18 SURGICAL SUPPLY — 119 items
APL SKNCLS STERI-STRIP NONHPOA (GAUZE/BANDAGES/DRESSINGS) ×2
BANDAGE ELASTIC 3 VELCRO ST LF (GAUZE/BANDAGES/DRESSINGS) ×3 IMPLANT
BANDAGE ELASTIC 4 VELCRO ST LF (GAUZE/BANDAGES/DRESSINGS) ×3 IMPLANT
BENZOIN TINCTURE PRP APPL 2/3 (GAUZE/BANDAGES/DRESSINGS) ×6 IMPLANT
BIT DRILL 2.5X110 QC LCP DISP (BIT) ×2 IMPLANT
BIT DRILL LCP QC 2X140 (BIT) ×3 IMPLANT
BIT DRILL QC 1.8X100 (BIT) ×2 IMPLANT
BLADE AVERAGE 25MMX9MM (BLADE)
BLADE AVERAGE 25X9 (BLADE) IMPLANT
BLADE SURG 10 STRL SS (BLADE) IMPLANT
BLADE SURG ROTATE 9660 (MISCELLANEOUS) ×3 IMPLANT
BNDG CMPR 9X4 STRL LF SNTH (GAUZE/BANDAGES/DRESSINGS) ×1
BNDG COHESIVE 4X5 TAN STRL (GAUZE/BANDAGES/DRESSINGS) ×3 IMPLANT
BNDG ESMARK 4X9 LF (GAUZE/BANDAGES/DRESSINGS) ×3 IMPLANT
BNDG GAUZE ELAST 4 BULKY (GAUZE/BANDAGES/DRESSINGS) ×6 IMPLANT
BRUSH SCRUB DISP (MISCELLANEOUS) ×6 IMPLANT
CLEANER TIP ELECTROSURG 2X2 (MISCELLANEOUS) ×3 IMPLANT
CLOSURE WOUND 1/2 X4 (GAUZE/BANDAGES/DRESSINGS)
CORDS BIPOLAR (ELECTRODE) ×3 IMPLANT
COVER SURGICAL LIGHT HANDLE (MISCELLANEOUS) ×6 IMPLANT
CUFF TOURNIQUET SINGLE 18IN (TOURNIQUET CUFF) ×2 IMPLANT
DRAIN PENROSE 1/4X12 LTX STRL (WOUND CARE) ×3 IMPLANT
DRAPE C-ARM 42X72 X-RAY (DRAPES) ×3 IMPLANT
DRAPE C-ARMOR (DRAPES) ×3 IMPLANT
DRAPE EXTREMITY T 121X128X90 (DRAPE) ×3 IMPLANT
DRAPE IMP U-DRAPE 54X76 (DRAPES) ×3 IMPLANT
DRAPE INCISE IOBAN 66X45 STRL (DRAPES) IMPLANT
DRAPE SURG 17X11 SM STRL (DRAPES) ×6 IMPLANT
DRAPE U-SHAPE 47X51 STRL (DRAPES) ×6 IMPLANT
DRSG ADAPTIC 3X8 NADH LF (GAUZE/BANDAGES/DRESSINGS) ×3 IMPLANT
DRSG MEPITEL 4X7.2 (GAUZE/BANDAGES/DRESSINGS) ×2 IMPLANT
DRSG PAD ABDOMINAL 8X10 ST (GAUZE/BANDAGES/DRESSINGS) ×6 IMPLANT
ELECT REM PT RETURN 9FT ADLT (ELECTROSURGICAL) ×3
ELECTRODE REM PT RTRN 9FT ADLT (ELECTROSURGICAL) ×1 IMPLANT
EVACUATOR 1/8 PVC DRAIN (DRAIN) IMPLANT
FACESHIELD WRAPAROUND (MASK) IMPLANT
FACESHIELD WRAPAROUND OR TEAM (MASK) IMPLANT
GAUZE SPONGE 4X4 12PLY STRL (GAUZE/BANDAGES/DRESSINGS) ×6 IMPLANT
GLOVE BIO SURGEON STRL SZ7.5 (GLOVE) ×3 IMPLANT
GLOVE BIO SURGEON STRL SZ8 (GLOVE) ×3 IMPLANT
GLOVE BIOGEL PI IND STRL 7.5 (GLOVE) ×1 IMPLANT
GLOVE BIOGEL PI IND STRL 8 (GLOVE) ×1 IMPLANT
GLOVE BIOGEL PI INDICATOR 7.5 (GLOVE) ×2
GLOVE BIOGEL PI INDICATOR 8 (GLOVE) ×4
GOWN STRL REUS W/ TWL LRG LVL3 (GOWN DISPOSABLE) ×2 IMPLANT
GOWN STRL REUS W/ TWL XL LVL3 (GOWN DISPOSABLE) ×1 IMPLANT
GOWN STRL REUS W/TWL LRG LVL3 (GOWN DISPOSABLE) ×6
GOWN STRL REUS W/TWL XL LVL3 (GOWN DISPOSABLE) ×3
HANDPIECE INTERPULSE COAX TIP (DISPOSABLE) ×3
KIT BASIN OR (CUSTOM PROCEDURE TRAY) ×3 IMPLANT
KIT INFUSE LRG II (Orthopedic Implant) ×2 IMPLANT
KIT ROOM TURNOVER OR (KITS) ×3 IMPLANT
LOOP VESSEL MAXI BLUE (MISCELLANEOUS) IMPLANT
MANIFOLD NEPTUNE II (INSTRUMENTS) ×3 IMPLANT
NDL HYPO 25X1 1.5 SAFETY (NEEDLE) ×1 IMPLANT
NEEDLE HYPO 25X1 1.5 SAFETY (NEEDLE) ×3 IMPLANT
NS IRRIG 1000ML POUR BTL (IV SOLUTION) ×3 IMPLANT
PACK ORTHO EXTREMITY (CUSTOM PROCEDURE TRAY) ×3 IMPLANT
PACK TOTAL JOINT (CUSTOM PROCEDURE TRAY) IMPLANT
PACK UNIVERSAL I (CUSTOM PROCEDURE TRAY) ×3 IMPLANT
PAD ARMBOARD 7.5X6 YLW CONV (MISCELLANEOUS) ×6 IMPLANT
PAD CAST 3X4 CTTN HI CHSV (CAST SUPPLIES) ×1 IMPLANT
PAD CAST 4YDX4 CTTN HI CHSV (CAST SUPPLIES) ×1 IMPLANT
PADDING CAST ABS 4INX4YD NS (CAST SUPPLIES) ×2
PADDING CAST ABS 6INX4YD NS (CAST SUPPLIES) ×2
PADDING CAST ABS COTTON 4X4 ST (CAST SUPPLIES) IMPLANT
PADDING CAST ABS COTTON 6X4 NS (CAST SUPPLIES) ×1 IMPLANT
PADDING CAST COTTON 3X4 STRL (CAST SUPPLIES) ×3
PADDING CAST COTTON 4X4 STRL (CAST SUPPLIES) ×3
PLATE 5 H DISHAL HUMERUS (Plate) ×2 IMPLANT
PLATE POSTL DISTAL LCP 5 H 3.5 (Plate) ×2 IMPLANT
SCREW CORTEX 2.4X22MM (Screw) ×2 IMPLANT
SCREW CORTEX 3.5 20MM (Screw) ×2 IMPLANT
SCREW CORTEX 3.5 22MM (Screw) ×4 IMPLANT
SCREW CORTEX 3.5 28MM (Screw) ×2 IMPLANT
SCREW CORTEX 3.5 30MM (Screw) ×2 IMPLANT
SCREW LOCK CORT ST 3.5X20 (Screw) IMPLANT
SCREW LOCK CORT ST 3.5X22 (Screw) IMPLANT
SCREW LOCK CORT ST 3.5X28 (Screw) ×1 IMPLANT
SCREW LOCK CORT ST 3.5X30 (Screw) ×1 IMPLANT
SCREW LOCK ST 2.7X14 (Screw) ×3 IMPLANT
SCREW LOCK ST 2.7X16 (Screw) ×6 IMPLANT
SCREW LOCK T15 FT 18X3.5X2.9X (Screw) IMPLANT
SCREW LOCK T8 14X2.7X2.1XST (Screw) ×1 IMPLANT
SCREW LOCKING 2.7X30 (Screw) IMPLANT
SCREW LOCKING 2.7X30MM (Screw) ×6 IMPLANT
SCREW LOCKING 2.7X34MM (Screw) ×2 IMPLANT
SCREW LOCKING 2.7X44MM (Screw) ×2 IMPLANT
SCREW LOCKING 3.5X18 (Screw) ×3 IMPLANT
SET HNDPC FAN SPRY TIP SCT (DISPOSABLE) ×1 IMPLANT
SLING ARM MED ADULT FOAM STRAP (SOFTGOODS) ×3 IMPLANT
SPLINT PLASTER CAST XFAST 5X30 (CAST SUPPLIES) IMPLANT
SPLINT PLASTER XFAST SET 5X30 (CAST SUPPLIES) ×8
SPONGE GAUZE 4X4 12PLY STER LF (GAUZE/BANDAGES/DRESSINGS) ×2 IMPLANT
SPONGE LAP 18X18 X RAY DECT (DISPOSABLE) ×6 IMPLANT
SPONGE SCRUB IODOPHOR (GAUZE/BANDAGES/DRESSINGS) ×5 IMPLANT
STAPLER VISISTAT 35W (STAPLE) ×3 IMPLANT
STOCKINETTE IMPERVIOUS 9X36 MD (GAUZE/BANDAGES/DRESSINGS) IMPLANT
STRIP CLOSURE SKIN 1/2X4 (GAUZE/BANDAGES/DRESSINGS) IMPLANT
SUCTION FRAZIER TIP 10 FR DISP (SUCTIONS) ×3 IMPLANT
SUT ETHIBOND 5 LR DA (SUTURE) ×3 IMPLANT
SUT FIBERWIRE #2 38 REV NDL BL (SUTURE)
SUT PROLENE 2 0 CT 1 (SUTURE) ×3 IMPLANT
SUT VIC AB 0 CT1 27 (SUTURE) ×6
SUT VIC AB 0 CT1 27XBRD ANBCTR (SUTURE) ×2 IMPLANT
SUT VIC AB 2-0 CT1 27 (SUTURE) ×6
SUT VIC AB 2-0 CT1 TAPERPNT 27 (SUTURE) ×2 IMPLANT
SUT VICRYL #0 CTB 1 (SUTURE) ×3 IMPLANT
SUTURE FIBERWR#2 38 REV NDL BL (SUTURE) IMPLANT
SYR 5ML LL (SYRINGE) IMPLANT
SYR CONTROL 10ML LL (SYRINGE) ×3 IMPLANT
TOWEL OR 17X24 6PK STRL BLUE (TOWEL DISPOSABLE) ×3 IMPLANT
TOWEL OR 17X26 10 PK STRL BLUE (TOWEL DISPOSABLE) ×5 IMPLANT
TRAY FOLEY CATH 16FRSI W/METER (SET/KITS/TRAYS/PACK) IMPLANT
TUBE CONNECTING 12'X1/4 (SUCTIONS) ×1
TUBE CONNECTING 12X1/4 (SUCTIONS) ×2 IMPLANT
UNDERPAD 30X30 INCONTINENT (UNDERPADS AND DIAPERS) ×3 IMPLANT
WATER STERILE IRR 1000ML POUR (IV SOLUTION) IMPLANT
YANKAUER SUCT BULB TIP NO VENT (SUCTIONS) ×3 IMPLANT

## 2014-11-18 NOTE — Progress Notes (Signed)
Blood bank called about second unit of blood. Asked if they could deliver second unit to OR 3.

## 2014-11-18 NOTE — Evaluation (Signed)
Physical Therapy Evaluation Patient Details Name: Edward Singleton MRN: [id number] DOB: 02/10/1951 Today's Date: 11/18/2014   History of Present Illness  pt sustained fall at home resulting in L distal humerus fx, L rib fxs, and L pelvic fxs.    Clinical Impression  Pt drowsy, but arousable and does participate in mobility.  Pt needs frequent orienting to maintaining L UE NWBing and pt is unable to state location of pain during mobility.  At this time feel pt would benefit from CIR at D/C to maximize Independence prior to returning to home.   Will need order for WBing status of LEs.  Notes indicate pt may be WBAT, however pt had Bil NWBing orders in when PT saw pt.  Please update orders.    Follow Up Recommendations CIR    Equipment Recommendations   (TBD)    Recommendations for Other Services Rehab consult     Precautions / Restrictions Precautions Precautions: Fall Required Braces or Orthoses: Other Brace/Splint;Sling Other Brace/Splint: L UE in splint and sling.   Restrictions Weight Bearing Restrictions: Yes RUE Weight Bearing: Weight bearing as tolerated (per ortho pa at beside. ) LUE Weight Bearing: Non weight bearing RLE Weight Bearing: Weight bearing as tolerated LLE Weight Bearing: Weight bearing as tolerated Other Position/Activity Restrictions: LE WBing per Lanny Hurst at bedside, however will request order clarification as Dr Ninfa Linden following pelvic fxs.        Mobility  Bed Mobility Overal bed mobility: Needs Assistance;+2 for physical assistance Bed Mobility: Rolling;Sidelying to Sit;Sit to Sidelying Rolling: Min assist Sidelying to sit: Mod assist;+2 for physical assistance;HOB elevated     Sit to sidelying: Mod assist;+2 for physical assistance General bed mobility comments: pt utilizes bed rail to A with rolling to R side, but needs increased A with coming to sitting and returning to supine.    Transfers                    Ambulation/Gait                Stairs            Wheelchair Mobility    Modified Rankin (Stroke Patients Only)       Balance Overall balance assessment: Needs assistance Sitting-balance support: Single extremity supported;Feet supported Sitting balance-Leahy Scale: Poor Sitting balance - Comments: pt leans to R side and at times reaching out with R UE and other times pushing with R UE.   Postural control: Right lateral lean                                   Pertinent Vitals/Pain Pain Assessment: Faces Faces Pain Scale: Hurts even more Pain Location: "L side" pt unable to state exact location.   Pain Descriptors / Indicators: Aching;Grimacing Pain Intervention(s): Monitored during session;Premedicated before session;Repositioned    Home Living Family/patient expects to be discharged to:: Inpatient rehab                      Prior Function Level of Independence: Independent         Comments: pt works and drives.     Hand Dominance        Extremity/Trunk Assessment   Upper Extremity Assessment: Defer to OT evaluation           Lower Extremity Assessment: RLE deficits/detail;LLE deficits/detail;Difficult to assess due to impaired cognition RLE Deficits / Details:  pt actively moving LE, but having difficulty following directions for mobility and seems painful,   LLE Deficits / Details: pt with minimal active movement and increased pain noted with PROM.    Cervical / Trunk Assessment: Normal  Communication   Communication: No difficulties  Cognition Arousal/Alertness: Suspect due to medications;Lethargic Behavior During Therapy: Flat affect Overall Cognitive Status: Impaired/Different from baseline Area of Impairment: Orientation;Attention;Memory;Following commands;Safety/judgement;Problem solving;Awareness Orientation Level: Disoriented to;Time;Situation (pt thought he was at [facility].  ) Current Attention Level: Sustained Memory: Decreased  short-term memory;Decreased recall of precautions Following Commands: Follows one step commands with increased time;Follows multi-step commands inconsistently Safety/Judgement: Decreased awareness of safety;Decreased awareness of deficits Awareness: Intellectual Problem Solving: Slow processing;Decreased initiation;Difficulty sequencing;Requires verbal cues;Requires tactile cues General Comments: pt drowsy, but able to be aroused.  pt was aware he was in the hospital, but unsure why he was here.  pt needs frequent re-directing to L UE NWBing.      General Comments      Exercises        Assessment/Plan    PT Assessment Patient needs continued PT services  PT Diagnosis Difficulty walking;Acute pain   PT Problem List Decreased strength;Decreased range of motion;Decreased activity tolerance;Decreased balance;Decreased mobility;Decreased coordination;Decreased cognition;Decreased knowledge of use of DME;Decreased safety awareness;Decreased knowledge of precautions;Pain  PT Treatment Interventions DME instruction;Gait training;Stair training;Functional mobility training;Therapeutic activities;Therapeutic exercise;Balance training;Neuromuscular re-education;Cognitive remediation;Patient/family education   PT Goals (Current goals can be found in the Care Plan section) Acute Rehab PT Goals Patient Stated Goal: pt not stating.   PT Goal Formulation: With patient Time For Goal Achievement: 12/02/14 Potential to Achieve Goals: Good    Frequency Min 3X/week   Barriers to discharge        Co-evaluation               End of Session Equipment Utilized During Treatment: Oxygen Activity Tolerance: Patient limited by pain Patient left: in bed;with call bell/phone within reach;with bed alarm set Nurse Communication: Mobility status;Weight bearing status         Time: [id number] PT Time Calculation (min) (ACUTE ONLY): 27 min   Charges:   PT Evaluation $Initial PT Evaluation Tier I:  1 Procedure PT Treatments $Therapeutic Activity: 8-22 mins   PT G CodesCatarina Hartshorn, [place] 11/18/2014, 10:06 AM

## 2014-11-18 NOTE — Brief Op Note (Signed)
11/17/2014 - 11/18/2014  4:13 PM  PATIENT:  Edward Singleton  64 y.o. male  PRE-OPERATIVE DIAGNOSIS:  left distal humerus fracture  POST-OPERATIVE DIAGNOSIS:  left subacute distal humerus fracture  PROCEDURE:  Procedure(s): OPEN REDUCTION INTERNAL FIXATION (ORIF) DISTAL HUMERUS FRACTURE (Left)  SURGEON:  Surgeon(s) and Role:    * Rozanna Box, MD - Primary  PHYSICIAN ASSISTANT: Ainsley Spinner, PA-C  ANESTHESIA:   general  I/O:  Total I/O In: 2575 [P.O.:240; I.V.:1950; Blood:335; IV Piggyback:50] Out: 1300 [Urine:1200; Blood:100]  SPECIMEN:  No Specimen  TOURNIQUET:   Total Tourniquet Time Documented: Upper Arm (Left) - 130 minutes Total: Upper Arm (Left) - 130 minutes    DICTATION: .Other Dictation: Dictation Number [id number]

## 2014-11-18 NOTE — Progress Notes (Signed)
L arm elevated and ice applied per order

## 2014-11-18 NOTE — Consult Note (Signed)
Orthopaedic Trauma Service (OTS)  Reason for Consult: L distal humerus fracture  Referring Physician:  Kathrynn Speed, MD (ortho)    HPI: Edward Singleton is an 64 y.o. RHD male who sustained a fall with resultant left distal humerus fracture and left pelvic ring fracture. The exact mechanism is a little unclear but it is believed the patient off of his back porch. I did get a chance to speak with the patient's sister who does acknowledge that the patient does have issues with alcoholism as well as polysubstance abuse. She states that over the last 3 weeks patient has been in somewhat of a downward spiral and has ceased communicating with her and the other siblings. Prior to these past 3 weeks patient was very structured and was able to maintain a very clean living environment as he does live by himself however over the last 3 weeks his house is been a disheveled.   Patient was brought to the emergency department yesterday where he had a comminuted left distal humerus fracture as well as a pelvic ring fracture. With the orthopaedic trauma service was consult and regarding the complexity of the injury. Patient is seen in the trauma unit. He is somewhat disoriented. His left arm is splinted. He is already with some tremors. He is able to answer questions appropriately but does take some time to do so.  denies any numbness or tingling in his left hand. Does ask Repeatedly how long he will be in hospital.  Patient is right-hand dominant. 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 Hypertension   . Hypercholesteremia   . Gout     Past Surgical History  Procedure Laterality Date  . Nephrectomy      History reviewed. No pertinent family history.  Social History:  reports that he has been smoking.  He does not have any smokeless tobacco history on file. He  reports that he drinks alcohol. His drug history is not on file.  Allergies: No Known Allergies  Medications:  I have reviewed the patient's current medications. Prior to Admission:  Prescriptions prior to admission  Medication Sig Dispense Refill Last Dose  . allopurinol (ZYLOPRIM) 300 MG tablet Take 150 mg by mouth daily.    11/17/2014 at Unknown time  . oxyCODONE-acetaminophen (PERCOCET/ROXICET) 5-325 MG per tablet Take 2 tablets by mouth every 4 (four) hours as needed for pain. (Patient not taking: Reported on 11/17/2014) 15 tablet 0 Not Taking at Unknown time  . simvastatin (ZOCOR) 40 MG tablet Take 40 mg by mouth daily.   11/17/2014 at Unknown time    Results for orders placed or performed during the hospital encounter of 11/17/14 (from the past 48 hour(s))  CBC with Differential     Status: Abnormal   Collection Time: 11/17/14  9:26 AM  Result Value Ref Range   WBC 18.7 (H) 4.0 - 10.5 K/uL   RBC 2.50 (L) 4.22 - 5.81 MIL/uL   Hemoglobin 9.2 (L) 13.0 - 17.0 g/dL   HCT 26.8 (L) 39.0 - 52.0 %   MCV 107.2 (H) 78.0 - 100.0 fL   MCH 36.8 (H) 26.0 - 34.0 pg   MCHC 34.3 30.0 - 36.0 g/dL   RDW 14.8 11.5 - 15.5 %   Platelets 285 150 - 400 K/uL   Neutrophils Relative % 89 (H) 43 -  77 %   Neutro Abs 16.6 (H) 1.7 - 7.7 K/uL   Lymphocytes Relative 2 (L) 12 - 46 %   Lymphs Abs 0.4 (L) 0.7 - 4.0 K/uL   Monocytes Relative 9 3 - 12 %   Monocytes Absolute 1.7 (H) 0.1 - 1.0 K/uL   Eosinophils Relative 0 0 - 5 %   Eosinophils Absolute 0.0 0.0 - 0.7 K/uL   Basophils Relative 0 0 - 1 %   Basophils Absolute 0.0 0.0 - 0.1 K/uL  Comprehensive metabolic panel     Status: Abnormal   Collection Time: 11/17/14  9:26 AM  Result Value Ref Range   Sodium 138 135 - 145 mmol/L   Potassium 5.1 3.5 - 5.1 mmol/L   Chloride 106 96 - 112 mmol/L   CO2 19 19 - 32 mmol/L   Glucose, Bld 121 (H) 70 - 99 mg/dL   BUN 21 6 - 23 mg/dL   Creatinine, Ser 2.24 (H) 0.50 - 1.35 mg/dL   Calcium 8.5 8.4 - 10.5 mg/dL   Total  Protein 5.7 (L) 6.0 - 8.3 g/dL   Albumin 3.5 3.5 - 5.2 g/dL   AST 45 (H) 0 - 37 U/L   ALT 24 0 - 53 U/L   Alkaline Phosphatase 115 39 - 117 U/L   Total Bilirubin 1.7 (H) 0.3 - 1.2 mg/dL   GFR calc non Af Amer 30 (L) >90 mL/min   GFR calc Af Amer 34 (L) >90 mL/min    Comment: (NOTE) The eGFR has been calculated using the CKD EPI equation. This calculation has not been validated in all clinical situations. eGFR's persistently <90 mL/min signify possible Chronic Kidney Disease.    Anion gap 13 5 - 15  Brain natriuretic peptide     Status: Abnormal   Collection Time: 11/17/14  9:31 AM  Result Value Ref Range   B Natriuretic Peptide 103.7 (H) 0.0 - 100.0 pg/mL  Lactic acid, plasma     Status: Abnormal   Collection Time: 11/17/14  9:36 AM  Result Value Ref Range   Lactic Acid, Venous 3.7 (HH) 0.5 - 2.0 mmol/L    Comment: REPEATED TO VERIFY CRITICAL RESULT CALLED TO, READ BACK BY AND VERIFIED WITH: D RICE,RN AT 1036 11/17/14 BY K BARR   TSH     Status: None   Collection Time: 11/17/14  9:36 AM  Result Value Ref Range   TSH 1.880 0.350 - 4.500 uIU/mL  I-Stat Troponin, ED (not at [facility])     Status: None   Collection Time: 11/17/14  9:41 AM  Result Value Ref Range   Troponin i, poc 0.00 0.00 - 0.08 ng/mL   Comment 3            Comment: Due to the release kinetics of cTnI, a negative result within the first hours of the onset of symptoms does not rule out myocardial infarction with certainty. If myocardial infarction is still suspected, repeat the test at appropriate intervals.   Urinalysis, Routine w reflex microscopic     Status: Abnormal   Collection Time: 11/17/14 11:35 AM  Result Value Ref Range   Color, Urine YELLOW YELLOW   APPearance CLEAR CLEAR   Specific Gravity, Urine 1.014 1.005 - 1.030   pH 5.0 5.0 - 8.0   Glucose, UA NEGATIVE NEGATIVE mg/dL   Hgb urine dipstick SMALL (A) NEGATIVE   Bilirubin Urine NEGATIVE NEGATIVE   Ketones, ur NEGATIVE NEGATIVE mg/dL   Protein,  ur NEGATIVE NEGATIVE mg/dL  Urobilinogen, UA 0.2 0.0 - 1.0 mg/dL   Nitrite NEGATIVE NEGATIVE   Leukocytes, UA NEGATIVE NEGATIVE  Urine microscopic-add on     Status: None   Collection Time: 11/17/14 11:35 AM  Result Value Ref Range   Squamous Epithelial / LPF RARE RARE   WBC, UA 0-2 <3 WBC/hpf   RBC / HPF 0-2 <3 RBC/hpf   Bacteria, UA RARE RARE  Lactic acid, plasma     Status: None   Collection Time: 11/17/14 12:05 PM  Result Value Ref Range   Lactic Acid, Venous 1.5 0.5 - 2.0 mmol/L  Ammonia     Status: Abnormal   Collection Time: 11/17/14 12:05 PM  Result Value Ref Range   Ammonia 33 (H) 11 - 32 umol/L  I-Stat Venous Blood Gas, ED (order at [facility] and [facility] only)     Status: Abnormal   Collection Time: 11/17/14 12:15 PM  Result Value Ref Range   pH, Ven 7.285 7.250 - 7.300   pCO2, Ven 40.3 (L) 45.0 - 50.0 mmHg   pO2, Ven 29.0 (LL) 30.0 - 45.0 mmHg   Bicarbonate 19.1 (L) 20.0 - 24.0 mEq/L   TCO2 20 0 - 100 mmol/L   O2 Saturation 47.0 %   Acid-base deficit 7.0 (H) 0.0 - 2.0 mmol/L   Sample type VENOUS    Comment NOTIFIED PHYSICIAN   Ethanol     Status: None   Collection Time: 11/17/14 12:34 PM  Result Value Ref Range   Alcohol, Ethyl (B) <5 0 - 9 mg/dL    Comment:        LOWEST DETECTABLE LIMIT FOR SERUM ALCOHOL IS 11 mg/dL FOR MEDICAL PURPOSES ONLY   MRSA PCR Screening     Status: None   Collection Time: 11/17/14  3:16 PM  Result Value Ref Range   MRSA by PCR NEGATIVE NEGATIVE    Comment:        The GeneXpert MRSA Assay (FDA approved for NASAL specimens only), is one component of a comprehensive MRSA colonization surveillance program. It is not intended to diagnose MRSA infection nor to guide or monitor treatment for MRSA infections.   Vitamin B12     Status: None   Collection Time: 11/17/14  8:09 PM  Result Value Ref Range   Vitamin B-12 300 211 - 911 pg/mL    Comment: Performed at [facility]  Folate     Status: None   Collection Time: 11/17/14   8:09 PM  Result Value Ref Range   Folate 6.5 ng/mL    Comment: (NOTE) Reference Ranges        Deficient:       0.4 - 3.3 ng/mL        Indeterminate:   3.4 - 5.4 ng/mL        Normal:              > 5.4 ng/mL Performed at [facility]   Iron and TIBC     Status: Abnormal   Collection Time: 11/17/14  8:09 PM  Result Value Ref Range   Iron 43 42 - 165 ug/dL   TIBC 199 (L) 215 - 435 ug/dL   Saturation Ratios 22 20 - 55 %   UIBC 156 125 - 400 ug/dL    Comment: Performed at [facility]  Ferritin     Status: Abnormal   Collection Time: 11/17/14  8:09 PM  Result Value Ref Range   Ferritin 662 (H) 22 - 322 ng/mL  Comment: Performed at [facility]  Reticulocytes     Status: Abnormal   Collection Time: 11/17/14  8:09 PM  Result Value Ref Range   Retic Ct Pct 2.8 0.4 - 3.1 %   RBC. 2.07 (L) 4.22 - 5.81 MIL/uL   Retic Count, Manual 58.0 19.0 - 186.0 K/uL  CBC     Status: Abnormal   Collection Time: 11/18/14  3:42 AM  Result Value Ref Range   WBC 14.9 (H) 4.0 - 10.5 K/uL   RBC 1.98 (L) 4.22 - 5.81 MIL/uL   Hemoglobin 7.3 (L) 13.0 - 17.0 g/dL    Comment: REPEATED TO VERIFY   HCT 21.5 (L) 39.0 - 52.0 %   MCV 108.6 (H) 78.0 - 100.0 fL   MCH 36.9 (H) 26.0 - 34.0 pg   MCHC 34.0 30.0 - 36.0 g/dL   RDW 15.0 11.5 - 15.5 %   Platelets 229 150 - 400 K/uL  Comprehensive metabolic panel     Status: Abnormal   Collection Time: 11/18/14  3:42 AM  Result Value Ref Range   Sodium 136 135 - 145 mmol/L   Potassium 4.2 3.5 - 5.1 mmol/L    Comment: DELTA CHECK NOTED   Chloride 109 96 - 112 mmol/L   CO2 18 (L) 19 - 32 mmol/L   Glucose, Bld 147 (H) 70 - 99 mg/dL   BUN 20 6 - 23 mg/dL   Creatinine, Ser 1.86 (H) 0.50 - 1.35 mg/dL   Calcium 7.9 (L) 8.4 - 10.5 mg/dL   Total Protein 4.5 (L) 6.0 - 8.3 g/dL   Albumin 2.6 (L) 3.5 - 5.2 g/dL   AST 41 (H) 0 - 37 U/L   ALT 17 0 - 53 U/L   Alkaline Phosphatase 83 39 - 117 U/L   Total Bilirubin 0.8 0.3 - 1.2 mg/dL   GFR calc non  Af Amer 37 (L) >90 mL/min   GFR calc Af Amer 43 (L) >90 mL/min    Comment: (NOTE) The eGFR has been calculated using the CKD EPI equation. This calculation has not been validated in all clinical situations. eGFR's persistently <90 mL/min signify possible Chronic Kidney Disease.    Anion gap 9 5 - 15  Lipid panel     Status: None   Collection Time: 11/18/14  3:42 AM  Result Value Ref Range   Cholesterol 90 0 - 200 mg/dL   Triglycerides 63 <150 mg/dL   HDL 54 >39 mg/dL   Total CHOL/HDL Ratio 1.7 RATIO   VLDL 13 0 - 40 mg/dL   LDL Cholesterol 23 0 - 99 mg/dL    Comment:        Total Cholesterol/HDL:CHD Risk Coronary Heart Disease Risk Table                     Men   Women  1/2 Average Risk   3.4   3.3  Average Risk       5.0   4.4  2 X Average Risk   9.6   7.1  3 X Average Risk  23.4   11.0        Use the calculated Patient Ratio above and the CHD Risk Table to determine the patient's CHD Risk.        ATP III CLASSIFICATION (LDL):  <100     mg/dL   Optimal  100-129  mg/dL   Near or Above  Optimal  130-159  mg/dL   Borderline  160-189  mg/dL   High  >190     mg/dL   Very High     Ct Abdomen Pelvis Wo Contrast  11/17/2014   CLINICAL DATA:  Fall with pelvic and rib fractures. Renal insufficiency as contraindication to intravenous contrast  EXAM: CT CHEST, ABDOMEN AND PELVIS WITHOUT CONTRAST  TECHNIQUE: Multidetector CT imaging of the chest, abdomen and pelvis was performed following the standard protocol without IV contrast.  COMPARISON:  None.  FINDINGS: CT CHEST FINDINGS  THORACIC INLET/BODY WALL:  No acute abnormality.  MEDIASTINUM:  Normal heart size. No pericardial effusion. Extensive atherosclerosis, including the coronary arteries, especially for age. No mediastinal hematoma or other evidence of vascular injury. Small sliding hiatal hernia.  LUNG WINDOWS:  No contusion, hemothorax, or pneumothorax.  Paraseptal and centrilobular emphysema.  OSSEOUS:  See  below  CT ABDOMEN AND PELVIS FINDINGS  BODY WALL: Unremarkable.  Liver: Mildly dense liver parenchyma which improves visualization. There is no evidence of laceration or contusion. No perihepatic hematoma.  Biliary: No evidence of biliary obstruction or stone.  Pancreas: Unremarkable.  Spleen: Splenectomy.  Adrenals: Unremarkable.  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 Mild enlargement of the right kidney is likely compensatory hypertrophy. Bilateral nephrolithiasis, up to 4 mm in the left lower pole. There is a presumed cyst in the left kidney, 3 cm in diameter. No evidence for renal injury.  Bladder: No evidence of wall thickening to suggest injury. Left extraperitoneal fluid is high-density and consistent with hematoma rather than urine.  Reproductive: Unremarkable.  Bowel: No evidence of injury  Peritoneum: No free fluid or gas.  Vascular: No periaortic hematoma to suggest acute injury. There is extensive atherosclerosis with fusiform infrarenal ectasia at 25 mm. There is focal ectasia of the suprarenal aorta at 28 mm.  OSSEOUS: Displaced fracture of the supracondylar left humerus with extensive surrounding soft tissue swelling. As noted previously there is surrounding mineralization which could reflect subacute timing.  Mildly displaced left obturator ring fracture involving the inferior pubic ramus and the puboacetabular junction. There a vertical fracture of the left sacral ala, with transverse component at the level of S2. No displacement along the foramina. Moderate extraperitoneal pelvic hematoma related to the fractures. Cannot evaluate for active hemorrhage without intravenous contrast.  Anteriorly dislocated sternoclavicular joint on the left. Chronic fragmentation of the joint is compatible with remote injury.  Bilateral rib fractures are healed or healing. No definite acute rib fractur.  L5 superior endplate deformity which appears chronic.  IMPRESSION: 1. Mildly displaced  fractures of the left inferior pubic ramus and puboacetabular junction with mild to moderate extraperitoneal hematoma. 2. S3 body and left sacral ala fractures without displacement along the foramina. 3. Displaced left supracondylar humerus fracture. 4. No evidence of intrathoracic or intra-abdominal injury. The patient is status post splenectomy.   Electronically Signed   By: Jorje Guild M.D.   On: 11/17/2014 12:34   Dg Chest 2 View  11/17/2014   CLINICAL DATA:  Acute left-sided body pain after fall yesterday. Initial encounter.  EXAM: CHEST  2 VIEW  COMPARISON:  None.  FINDINGS: The heart size and mediastinal contours are within normal limits. Both lungs are clear. No pneumothorax or pleural effusion is noted. Old right rib fractures are noted. Mildly displaced acute fracture is seen involving lateral portion of left seventh rib.  IMPRESSION: Mildly displaced left seventh rib fracture. No acute cardiopulmonary abnormality seen.   Electronically Signed  By: Sabino Dick M.D.   On: 11/17/2014 10:31   Dg Pelvis 1-2 Views  11/17/2014   CLINICAL DATA:  Acute left pelvic pain after fall yesterday. Initial encounter.  EXAM: PELVIS - 1-2 VIEW  COMPARISON:  September 20, 2013.  FINDINGS: Mildly displaced fracture is seen involving the left inferior pubic ramus. Mildly displaced medial left acetabular fracture is noted. These appear to be closed and posttraumatic. Sacroiliac joints appear normal.  IMPRESSION: Mildly displaced fractures are seen involving the medial left acetabulum and left inferior pubic ramus.   Electronically Signed   By: Sabino Dick M.D.   On: 11/17/2014 10:28   Dg Forearm Left  11/17/2014   CLINICAL DATA:  Fall on left-sided body with humerus pain and swelling. Initial encounter.  EXAM: LEFT FOREARM - 2 VIEW  COMPARISON:  None.  FINDINGS: Transverse fracture through the supracondylar humerus with marked anterior displacement. There is callus around the nonunited fracture, suggesting subacute  injury. Diffuse surrounding soft tissue swelling. The elbow articulation is intact.  IMPRESSION: Subacute appearing displaced supracondylar humerus fracture.   Electronically Signed   By: Jorje Guild M.D.   On: 11/17/2014 10:27   Ct Head Wo Contrast  11/17/2014   CLINICAL DATA:  Acute right arm pain and head injury after fall today.  EXAM: CT HEAD WITHOUT CONTRAST  CT CERVICAL SPINE WITHOUT CONTRAST  TECHNIQUE: Multidetector CT imaging of the head and cervical spine was performed following the standard protocol without intravenous contrast. Multiplanar CT image reconstructions of the cervical spine were also generated.  COMPARISON:  CT scan of January 15, 2014.  FINDINGS: CT HEAD FINDINGS  Bony calvarium appears intact. Small right frontal scalp hematoma is noted. Mild diffuse cortical atrophy is noted. Mild chronic ischemic white matter disease is noted. No mass effect or midline shift is noted. Ventricular size is within normal limits. There is no evidence of mass lesion, hemorrhage or acute infarction.  CT CERVICAL SPINE FINDINGS  No fracture or spondylolisthesis is noted. Mild degenerative disc disease is noted at C5-6 and C6-7 with anterior and posterior osteophyte formation. Mild hypertrophy of right posterior facet joint at C4-5 is noted. Visualized lung apices appear normal.  IMPRESSION: Small right frontal scalp hematoma. Mild diffuse cortical atrophy. Mild chronic ischemic white matter disease. No acute intracranial abnormality seen.  Mild degenerative disc disease is noted at C5-6 and C6-7. No acute abnormality seen in the cervical spine.   Electronically Signed   By: Sabino Dick M.D.   On: 11/17/2014 11:01   Ct Chest Wo Contrast  11/17/2014   CLINICAL DATA:  Fall with pelvic and rib fractures. Renal insufficiency as contraindication to intravenous contrast  EXAM: CT CHEST, ABDOMEN AND PELVIS WITHOUT CONTRAST  TECHNIQUE: Multidetector CT imaging of the chest, abdomen and pelvis was performed  following the standard protocol without IV contrast.  COMPARISON:  None.  FINDINGS: CT CHEST FINDINGS  THORACIC INLET/BODY WALL:  No acute abnormality.  MEDIASTINUM:  Normal heart size. No pericardial effusion. Extensive atherosclerosis, including the coronary arteries, especially for age. No mediastinal hematoma or other evidence of vascular injury. Small sliding hiatal hernia.  LUNG WINDOWS:  No contusion, hemothorax, or pneumothorax.  Paraseptal and centrilobular emphysema.  OSSEOUS:  See below  CT ABDOMEN AND PELVIS FINDINGS  BODY WALL: Unremarkable.  Liver: Mildly dense liver parenchyma which improves visualization. There is no evidence of laceration or contusion. No perihepatic hematoma.  Biliary: No evidence of biliary obstruction or stone.  Pancreas: Unremarkable.  Spleen: Splenectomy.  Adrenals: Unremarkable.  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 Mild enlargement of the right kidney is likely compensatory hypertrophy. Bilateral nephrolithiasis, up to 4 mm in the left lower pole. There is a presumed cyst in the left kidney, 3 cm in diameter. No evidence for renal injury.  Bladder: No evidence of wall thickening to suggest injury. Left extraperitoneal fluid is high-density and consistent with hematoma rather than urine.  Reproductive: Unremarkable.  Bowel: No evidence of injury  Peritoneum: No free fluid or gas.  Vascular: No periaortic hematoma to suggest acute injury. There is extensive atherosclerosis with fusiform infrarenal ectasia at 25 mm. There is focal ectasia of the suprarenal aorta at 28 mm.  OSSEOUS: Displaced fracture of the supracondylar left humerus with extensive surrounding soft tissue swelling. As noted previously there is surrounding mineralization which could reflect subacute timing.  Mildly displaced left obturator ring fracture involving the inferior pubic ramus and the puboacetabular junction. There a vertical fracture of the left sacral ala, with transverse  component at the level of S2. No displacement along the foramina. Moderate extraperitoneal pelvic hematoma related to the fractures. Cannot evaluate for active hemorrhage without intravenous contrast.  Anteriorly dislocated sternoclavicular joint on the left. Chronic fragmentation of the joint is compatible with remote injury.  Bilateral rib fractures are healed or healing. No definite acute rib fractur.  L5 superior endplate deformity which appears chronic.  IMPRESSION: 1. Mildly displaced fractures of the left inferior pubic ramus and puboacetabular junction with mild to moderate extraperitoneal hematoma. 2. S3 body and left sacral ala fractures without displacement along the foramina. 3. Displaced left supracondylar humerus fracture. 4. No evidence of intrathoracic or intra-abdominal injury. The patient is status post splenectomy.   Electronically Signed   By: Jorje Guild M.D.   On: 11/17/2014 12:34   Ct Cervical Spine Wo Contrast  11/17/2014   CLINICAL DATA:  Acute right arm pain and head injury after fall today.  EXAM: CT HEAD WITHOUT CONTRAST  CT CERVICAL SPINE WITHOUT CONTRAST  TECHNIQUE: Multidetector CT imaging of the head and cervical spine was performed following the standard protocol without intravenous contrast. Multiplanar CT image reconstructions of the cervical spine were also generated.  COMPARISON:  CT scan of January 15, 2014.  FINDINGS: CT HEAD FINDINGS  Bony calvarium appears intact. Small right frontal scalp hematoma is noted. Mild diffuse cortical atrophy is noted. Mild chronic ischemic white matter disease is noted. No mass effect or midline shift is noted. Ventricular size is within normal limits. There is no evidence of mass lesion, hemorrhage or acute infarction.  CT CERVICAL SPINE FINDINGS  No fracture or spondylolisthesis is noted. Mild degenerative disc disease is noted at C5-6 and C6-7 with anterior and posterior osteophyte formation. Mild hypertrophy of right posterior facet joint  at C4-5 is noted. Visualized lung apices appear normal.  IMPRESSION: Small right frontal scalp hematoma. Mild diffuse cortical atrophy. Mild chronic ischemic white matter disease. No acute intracranial abnormality seen.  Mild degenerative disc disease is noted at C5-6 and C6-7. No acute abnormality seen in the cervical spine.   Electronically Signed   By: Sabino Dick M.D.   On: 11/17/2014 11:01   Dg Chest Port 1 View  11/18/2014   CLINICAL DATA:  Left seventh rib fracture  EXAM: PORTABLE CHEST - 1 VIEW  COMPARISON:  November 17, 2014 chest radiograph and chest CT  FINDINGS: The fracture of the anterior left seventh rib is slightly displaced but stable. There is a skin fold  on the left but no apparent pneumothorax. There is no edema or consolidation. The heart size and pulmonary vascularity are normal. No adenopathy.  IMPRESSION: Slightly displaced left seventh rib fracture. Skin fold on left but no apparent pneumothorax. No edema or consolidation.   Electronically Signed   By: Lowella Grip III M.D.   On: 11/18/2014 07:59   Dg Humerus Left  11/17/2014   CLINICAL DATA:  Status post fall 11/16/2014 with left upper arm pain.  EXAM: LEFT HUMERUS - 2+ VIEW  COMPARISON:  None.  FINDINGS: The patient has an acute transcondylar fracture of the distal humerus. No other acute bony or joint abnormality is identified.  IMPRESSION: Acute transcondylar fracture distal humerus.   Electronically Signed   By: Inge Rise M.D.   On: 11/17/2014 10:28   Dg Femur Min 2 Views Left  11/17/2014   CLINICAL DATA:  Status post fall 11/16/2014. Left pelvic and upper leg pain. Initial encounter.  EXAM: LEFT FEMUR 2 VIEWS  COMPARISON:  None.  FINDINGS: Acute left superior and inferior pubic ramus fractures are identified. No other acute bony or joint abnormality is seen. No notable degenerative disease about the hip or knee is identified. Atherosclerosis is noted.  IMPRESSION: Acute right superior and inferior pubic ramus  fractures.   Electronically Signed   By: Inge Rise M.D.   On: 11/17/2014 10:29    Review of Systems  Constitutional: Negative for fever.  Respiratory: Negative for shortness of breath and wheezing.   Cardiovascular: Negative for chest pain and palpitations.  Gastrointestinal: Negative for nausea and vomiting.   Blood pressure 114/76, pulse 98, temperature 97.1 F (36.2 C), temperature source Oral, resp. rate 10, height _0  (1.702 m), weight 74.844 kg (165 lb), SpO2 95 %. Physical Exam  Constitutional:  Older appearing male, slightly disoriented + Tremors  Cardiovascular: S1 normal and S2 normal.   Respiratory: Effort normal. No accessory muscle usage. No respiratory distress.  GI:  Soft, nontender, nondistended,+ bowel sounds  Musculoskeletal:  Left upper extremity Inspection:   Long-arm splint   Redness and bruising noted proximally along the left upper arm   Splint has been reinforced with a dressing   ? oozing from skin tears   No reports of the fracture being open Bony eval:   Patient is splinted   Shoulder and hand are nontender, clavicle nontender Soft tissue:   Significant ecchymosis and redness proximally.   Hand soft tissue is unremarkable ROM:   Patient able to move fingers but this is with much prompting Sensation:   Radial, ulnar, median and axillary nerve sensory functions grossly intact Motor:   Radial, ulnar, median, AIN, PA and motor grossly intact Vascular:   Brisk capillary refill   Unable to palpate radial pulse given location of splint   Extremity is warm   Right upper extremity Inspection:   numerous bruises noted   Gross deformities appreciated Bony eval:   Nontender to clavicle, shoulder, elbow, forearm, wrist, hand Soft tissue:   No significant swelling noted to the right upper extremity   Scattered bruises noted as well ROM:   Full range of motion noted of the shoulder elbow forearm wrist and hand Sensation:   Radial, ulnar,  median, axillary sensory grossly intact Motor:   Radial,ulnar, median, axillary motor function grossly intact Vascular:   + Radial pulse   Extremity is warm    Pelvis   No gross instability   Pain with axial loading of his left hip  Pain in hip range of motion on the left   Pain Is unremarkable  Bilateral lower extremity Inspection:   No acute findings   Abrasions and ecchymosis noted bilaterally Bony eval:   No crepitus or gross motion with evaluation of lower extremities. Soft tissue:   Knees and ankles are stable ROM:   Passive range of motion is unremarkable. No blocks to motion Sensation:   DPN, SPN, TN sensation intact Motor:   EHL, FHL, anterior tibialis, posterior tibialis, peroneals and gastrocsoleus complex functions grossly Vascular:   + DP pulses   No significant swelling   Compartments are soft Neurologic:   Patient does appear to have a 4 beat clonus bilaterally       Assessment/Plan:  64 year old right-hand-dominant white male status post fall with comminuted left distal humerus fracture  1.Fall  2. Left distal humerus fracture  Patient needs a CT scan of his left elbow to fully characterize this fracture pattern. This will have a enormous implications on anticipated surgical approach. If there is no intra-articular involvement minimal intra-articular involvement we may be able to perform a triceps sparing approach otherwise he will need an olecranon osteotomy. I am a little concerned that the patient really end up with a total elbow arthroplasty given the distal nature of the fracture itself particularly if there is extensive comminution of the joint.   Ultimately patient will be nonweightbearing for 6-8 weeks. Range of motion restrictions will be dependent on approach taken. If triceps sparing approach used he will not have any restrictions for active extension. Is olecranon osteotomy is performed he will not be allowed active extension of his elbow for  6-8 weeks as well. He will definitely be splinted or placed into a static fixator for several weeks given his history of alcohol abuse,   patient may benefit from waiting a few more days as he does appear to have some tremors are ready. If we do proceed with surgery later on this afternoon it may be beneficial to leave him intubated for a day or 2.   We will recheck his lactic acid level as well to make sure he is compensated for possible surgery later today  3. Acute anemia  Patient receiving 1 unit of PRBCs in preparation for surgery  4. Medical issues per medicine  ? Neurology consult   5. Disposition  Possible OR later on this afternoon versus later this week. CT scan before proceeding to the OR  Did speak with the patient's sister who agreed with treatment plan    Jari Pigg, PA-C [facility] [phone] (P) 11/18/2014, 9:28 AM

## 2014-11-18 NOTE — Anesthesia Preprocedure Evaluation (Addendum)
Anesthesia Evaluation  Patient identified by MRN, date of birth, ID band Patient confused    Reviewed: Allergy & Precautions, H&P , NPO status , Patient's Chart, lab work & pertinent test results  Airway Mallampati: II  TM Distance: >3 FB Neck ROM: Full    Dental  (+) Dental Advidsory Given, Teeth Intact   Pulmonary Current Smoker,  breath sounds clear to auscultation        Cardiovascular hypertension, On Medications Rhythm:Regular Rate:Normal     Neuro/Psych    GI/Hepatic (+)     substance abuse  alcohol use,   Endo/Other    Renal/GU S/p Nephrectomy     Musculoskeletal   Abdominal   Peds  Hematology  (+) anemia ,   Anesthesia Other Findings ETOH abuse.  Reproductive/Obstetrics                            Anesthesia Physical Anesthesia Plan  ASA: III  Anesthesia Plan: General   Post-op Pain Management:    Induction: Intravenous  Airway Management Planned: Oral ETT  Additional Equipment:   Intra-op Plan:   Post-operative Plan: Extubation in OR  Informed Consent: I have reviewed the patients History and Physical, chart, labs and discussed the procedure including the risks, benefits and alternatives for the proposed anesthesia with the patient or authorized representative who has indicated his/her understanding and acceptance.   Dental Advisory Given and Dental advisory given  Plan Discussed with: Anesthesiologist and CRNA  Anesthesia Plan Comments:        Anesthesia Quick Evaluation

## 2014-11-18 NOTE — Progress Notes (Signed)
Patient ID: Edward Singleton, male   DOB: 1951/05/29, 64 y.o.   MRN: [id number] I did speak with his sister last evening and she understands fully his orthopedic injuries.  His left elbow does need ORIF given the significant displacement of the fracture.  It certainly will be difficult given the fact that it will need an olecranon osteotomy and since the fracture looks slightly subacute.  I'll make him NPO for now for the potential for surgery late this afternoon.  I'll will also consult Dr. Marcelino Scot, Ortho Traumatologist, for his input on this elbow injury.

## 2014-11-18 NOTE — Progress Notes (Signed)
UR completed.  Received order for [facility] for home safety eval. Initial PT recommendation was for CIR at d/c. Will continue to follow pt and make arrangements for home if he does not go to inpt rehab first.   Sandi Mariscal, RN BSN [place] Trauma/Neuro ICU Case Manager [phone]

## 2014-11-18 NOTE — Progress Notes (Signed)
UR completed.  Initial therapy eval recommending CIR. Will continue to follow for additional recommendations.   Sandi Mariscal, RN BSN [place] CCM Trauma/Neuro ICU Case Manager [phone]

## 2014-11-18 NOTE — Transfer of Care (Signed)
Immediate Anesthesia Transfer of Care Note  Patient: Edward Singleton  Procedure(s) Performed: Procedure(s): OPEN REDUCTION INTERNAL FIXATION (ORIF) DISTAL HUMERUS FRACTURE (Left)  Patient Location: ICU  Anesthesia Type:General  Level of Consciousness: sedated, unresponsive and Patient remains intubated per anesthesia plan  Airway & Oxygen Therapy: Patient remains intubated per anesthesia plan and Patient placed on Ventilator (see vital sign flow sheet for setting)  Post-op Assessment: Report given to RN and Post -op Vital signs reviewed and stable  Post vital signs: Reviewed and stable  Last Vitals:  Filed Vitals:   11/18/14 1145  BP: 106/65  Pulse:   Temp: 37.1 C  Resp:     Complications: No apparent anesthesia complications

## 2014-11-18 NOTE — Progress Notes (Signed)
Rehab Admissions Coordinator Note:  Patient was screened by Retta Diones for appropriateness for an Inpatient Acute Rehab Consult.  At this time, we are recommending Inpatient Rehab consult.  Retta Diones 11/18/2014, 12:46 PM  I can be reached at [phone].

## 2014-11-18 NOTE — Progress Notes (Signed)
Patient unable to urinate with two attempts and bladder scan reveal >700 cc of urine in bladder. Per Dr. Grandville Silos place foley. Okay to give sips of water with meds before surgery this afternoon. Will continue to monitor.   Edward Singleton

## 2014-11-18 NOTE — Anesthesia Postprocedure Evaluation (Signed)
  Anesthesia Post-op Note  Patient: Edward Singleton  Procedure(s) Performed: Procedure(s): OPEN REDUCTION INTERNAL FIXATION (ORIF) DISTAL HUMERUS FRACTURE (Left)  Patient Location: ICU  Anesthesia Type:General  Level of Consciousness: sedated and Patient remains intubated per anesthesia plan  Airway and Oxygen Therapy: Patient remains intubated per anesthesia plan and Patient placed on Ventilator (see vital sign flow sheet for setting)  Post-op Pain: none  Post-op Assessment: Post-op Vital signs reviewed  Post-op Vital Signs: Reviewed  Last Vitals:  Filed Vitals:   11/18/14 1830  BP: 103/67  Pulse: 65  Temp:   Resp: 16    Complications: No apparent anesthesia complications

## 2014-11-18 NOTE — Progress Notes (Signed)
Occupational Therapy Cancellation Note  Pt currently in procedure.  Will reattempt.   Lucille Passy, OTR/L [phone]

## 2014-11-18 NOTE — Anesthesia Procedure Notes (Signed)
Procedure Name: Intubation Date/Time: 11/18/2014 12:56 PM Performed by: Neldon Newport Pre-anesthesia Checklist: Patient being monitored, Suction available, Emergency Drugs available, Patient identified and Timeout performed Patient Re-evaluated:Patient Re-evaluated prior to inductionOxygen Delivery Method: Circle system utilized Preoxygenation: Pre-oxygenation with 100% oxygen Intubation Type: IV induction Ventilation: Mask ventilation without difficulty Laryngoscope Size: Mac and 3 Grade View: Grade I Tube type: Oral Tube size: 7.5 mm Number of attempts: 1 Intubation method: upper dentures were removed prior to induction. Placement Confirmation: positive ETCO2,  ETT inserted through vocal cords under direct vision and breath sounds checked- equal and bilateral Secured at: 23 cm Tube secured with: Tape Dental Injury: Teeth and Oropharynx as per pre-operative assessment

## 2014-11-18 NOTE — Progress Notes (Signed)
Dr Edward Singleton in to speak with pt,. Mr Edward Singleton was able to sign his consent with Dr Edward Singleton, but due to confusion at times Edward Singleton his sister was called and gave her consent via telephone.

## 2014-11-18 NOTE — Progress Notes (Signed)
Trauma Service Note  Subjective: Patient is awake but a little disoriented.  Has gotten some Ativan per CIWA protocol  Objective: Vital signs in last 24 hours: Temp:  [97.3 F (36.3 C)-100.5 F (38.1 C)] 98.9 F (37.2 C) (02/09 0404) Pulse Rate:  [95-131] 98 (02/09 0500) Resp:  [11-24] 14 (02/09 0500) BP: (87-151)/(60-95) 114/73 mmHg (02/09 0500) SpO2:  [87 %-100 %] 98 % (02/09 0500) Weight:  [74.844 kg (165 lb)] 74.844 kg (165 lb) (02/08 0848)    Intake/Output from previous day: 02/08 0701 - 02/09 0700 In: 416.7 [P.O.:75; I.V.:341.7] Out: 435 [Urine:435] Intake/Output this shift: Total I/O In: 250 [I.V.:250] Out: 375 [Urine:375]  General: No acute distress, rolls to right side for comfort  Lungs: Clear to auscultation.  Abd: Soft, begign  Extremities: Oozing from the LUE/splinted humerus fracture which must be open.  Has dropped hemoglobin to 7.3 from 9.2  Neuro: Confused and a bit disoriented, but not severe  Lab Results: CBC   Recent Labs  11/17/14 0926 11/18/14 0342  WBC 18.7* 14.9*  HGB 9.2* 7.3*  HCT 26.8* 21.5*  PLT 285 229   BMET  Recent Labs  11/17/14 0926 11/18/14 0342  NA 138 136  K 5.1 4.2  CL 106 109  CO2 19 18*  GLUCOSE 121* 147*  BUN 21 20  CREATININE 2.24* 1.86*  CALCIUM 8.5 7.9*   PT/INR No results for input(s): LABPROT, INR in the last 72 hours. ABG  Recent Labs  11/17/14 1215  HCO3 19.1*    Studies/Results: Ct Abdomen Pelvis Wo Contrast  11/17/2014   CLINICAL DATA:  Fall with pelvic and rib fractures. Renal insufficiency as contraindication to intravenous contrast  EXAM: CT CHEST, ABDOMEN AND PELVIS WITHOUT CONTRAST  TECHNIQUE: Multidetector CT imaging of the chest, abdomen and pelvis was performed following the standard protocol without IV contrast.  COMPARISON:  None.  FINDINGS: CT CHEST FINDINGS  THORACIC INLET/BODY WALL:  No acute abnormality.  MEDIASTINUM:  Normal heart size. No pericardial effusion. Extensive  atherosclerosis, including the coronary arteries, especially for age. No mediastinal hematoma or other evidence of vascular injury. Small sliding hiatal hernia.  LUNG WINDOWS:  No contusion, hemothorax, or pneumothorax.  Paraseptal and centrilobular emphysema.  OSSEOUS:  See below  CT ABDOMEN AND PELVIS FINDINGS  BODY WALL: Unremarkable.  Liver: Mildly dense liver parenchyma which improves visualization. There is no evidence of laceration or contusion. No perihepatic hematoma.  Biliary: No evidence of biliary obstruction or stone.  Pancreas: Unremarkable.  Spleen: Splenectomy.  Adrenals: Unremarkable.  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 Mild enlargement of the right kidney is likely compensatory hypertrophy. Bilateral nephrolithiasis, up to 4 mm in the left lower pole. There is a presumed cyst in the left kidney, 3 cm in diameter. No evidence for renal injury.  Bladder: No evidence of wall thickening to suggest injury. Left extraperitoneal fluid is high-density and consistent with hematoma rather than urine.  Reproductive: Unremarkable.  Bowel: No evidence of injury  Peritoneum: No free fluid or gas.  Vascular: No periaortic hematoma to suggest acute injury. There is extensive atherosclerosis with fusiform infrarenal ectasia at 25 mm. There is focal ectasia of the suprarenal aorta at 28 mm.  OSSEOUS: Displaced fracture of the supracondylar left humerus with extensive surrounding soft tissue swelling. As noted previously there is surrounding mineralization which could reflect subacute timing.  Mildly displaced left obturator ring fracture involving the inferior pubic ramus and the puboacetabular junction. There a vertical fracture of the left  sacral ala, with transverse component at the level of S2. No displacement along the foramina. Moderate extraperitoneal pelvic hematoma related to the fractures. Cannot evaluate for active hemorrhage without intravenous contrast.  Anteriorly dislocated  sternoclavicular joint on the left. Chronic fragmentation of the joint is compatible with remote injury.  Bilateral rib fractures are healed or healing. No definite acute rib fractur.  L5 superior endplate deformity which appears chronic.  IMPRESSION: 1. Mildly displaced fractures of the left inferior pubic ramus and puboacetabular junction with mild to moderate extraperitoneal hematoma. 2. S3 body and left sacral ala fractures without displacement along the foramina. 3. Displaced left supracondylar humerus fracture. 4. No evidence of intrathoracic or intra-abdominal injury. The patient is status post splenectomy.   Electronically Signed   By: Jorje Guild M.D.   On: 11/17/2014 12:34   Dg Chest 2 View  11/17/2014   CLINICAL DATA:  Acute left-sided body pain after fall yesterday. Initial encounter.  EXAM: CHEST  2 VIEW  COMPARISON:  None.  FINDINGS: The heart size and mediastinal contours are within normal limits. Both lungs are clear. No pneumothorax or pleural effusion is noted. Old right rib fractures are noted. Mildly displaced acute fracture is seen involving lateral portion of left seventh rib.  IMPRESSION: Mildly displaced left seventh rib fracture. No acute cardiopulmonary abnormality seen.   Electronically Signed   By: Sabino Dick M.D.   On: 11/17/2014 10:31   Dg Pelvis 1-2 Views  11/17/2014   CLINICAL DATA:  Acute left pelvic pain after fall yesterday. Initial encounter.  EXAM: PELVIS - 1-2 VIEW  COMPARISON:  September 20, 2013.  FINDINGS: Mildly displaced fracture is seen involving the left inferior pubic ramus. Mildly displaced medial left acetabular fracture is noted. These appear to be closed and posttraumatic. Sacroiliac joints appear normal.  IMPRESSION: Mildly displaced fractures are seen involving the medial left acetabulum and left inferior pubic ramus.   Electronically Signed   By: Sabino Dick M.D.   On: 11/17/2014 10:28   Dg Forearm Left  11/17/2014   CLINICAL DATA:  Fall on left-sided  body with humerus pain and swelling. Initial encounter.  EXAM: LEFT FOREARM - 2 VIEW  COMPARISON:  None.  FINDINGS: Transverse fracture through the supracondylar humerus with marked anterior displacement. There is callus around the nonunited fracture, suggesting subacute injury. Diffuse surrounding soft tissue swelling. The elbow articulation is intact.  IMPRESSION: Subacute appearing displaced supracondylar humerus fracture.   Electronically Signed   By: Jorje Guild M.D.   On: 11/17/2014 10:27   Ct Head Wo Contrast  11/17/2014   CLINICAL DATA:  Acute right arm pain and head injury after fall today.  EXAM: CT HEAD WITHOUT CONTRAST  CT CERVICAL SPINE WITHOUT CONTRAST  TECHNIQUE: Multidetector CT imaging of the head and cervical spine was performed following the standard protocol without intravenous contrast. Multiplanar CT image reconstructions of the cervical spine were also generated.  COMPARISON:  CT scan of January 15, 2014.  FINDINGS: CT HEAD FINDINGS  Bony calvarium appears intact. Small right frontal scalp hematoma is noted. Mild diffuse cortical atrophy is noted. Mild chronic ischemic white matter disease is noted. No mass effect or midline shift is noted. Ventricular size is within normal limits. There is no evidence of mass lesion, hemorrhage or acute infarction.  CT CERVICAL SPINE FINDINGS  No fracture or spondylolisthesis is noted. Mild degenerative disc disease is noted at C5-6 and C6-7 with anterior and posterior osteophyte formation. Mild hypertrophy of right posterior facet joint  at C4-5 is noted. Visualized lung apices appear normal.  IMPRESSION: Small right frontal scalp hematoma. Mild diffuse cortical atrophy. Mild chronic ischemic white matter disease. No acute intracranial abnormality seen.  Mild degenerative disc disease is noted at C5-6 and C6-7. No acute abnormality seen in the cervical spine.   Electronically Signed   By: Sabino Dick M.D.   On: 11/17/2014 11:01   Ct Chest Wo  Contrast  11/17/2014   CLINICAL DATA:  Fall with pelvic and rib fractures. Renal insufficiency as contraindication to intravenous contrast  EXAM: CT CHEST, ABDOMEN AND PELVIS WITHOUT CONTRAST  TECHNIQUE: Multidetector CT imaging of the chest, abdomen and pelvis was performed following the standard protocol without IV contrast.  COMPARISON:  None.  FINDINGS: CT CHEST FINDINGS  THORACIC INLET/BODY WALL:  No acute abnormality.  MEDIASTINUM:  Normal heart size. No pericardial effusion. Extensive atherosclerosis, including the coronary arteries, especially for age. No mediastinal hematoma or other evidence of vascular injury. Small sliding hiatal hernia.  LUNG WINDOWS:  No contusion, hemothorax, or pneumothorax.  Paraseptal and centrilobular emphysema.  OSSEOUS:  See below  CT ABDOMEN AND PELVIS FINDINGS  BODY WALL: Unremarkable.  Liver: Mildly dense liver parenchyma which improves visualization. There is no evidence of laceration or contusion. No perihepatic hematoma.  Biliary: No evidence of biliary obstruction or stone.  Pancreas: Unremarkable.  Spleen: Splenectomy.  Adrenals: Unremarkable.  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 Mild enlargement of the right kidney is likely compensatory hypertrophy. Bilateral nephrolithiasis, up to 4 mm in the left lower pole. There is a presumed cyst in the left kidney, 3 cm in diameter. No evidence for renal injury.  Bladder: No evidence of wall thickening to suggest injury. Left extraperitoneal fluid is high-density and consistent with hematoma rather than urine.  Reproductive: Unremarkable.  Bowel: No evidence of injury  Peritoneum: No free fluid or gas.  Vascular: No periaortic hematoma to suggest acute injury. There is extensive atherosclerosis with fusiform infrarenal ectasia at 25 mm. There is focal ectasia of the suprarenal aorta at 28 mm.  OSSEOUS: Displaced fracture of the supracondylar left humerus with extensive surrounding soft tissue swelling.  As noted previously there is surrounding mineralization which could reflect subacute timing.  Mildly displaced left obturator ring fracture involving the inferior pubic ramus and the puboacetabular junction. There a vertical fracture of the left sacral ala, with transverse component at the level of S2. No displacement along the foramina. Moderate extraperitoneal pelvic hematoma related to the fractures. Cannot evaluate for active hemorrhage without intravenous contrast.  Anteriorly dislocated sternoclavicular joint on the left. Chronic fragmentation of the joint is compatible with remote injury.  Bilateral rib fractures are healed or healing. No definite acute rib fractur.  L5 superior endplate deformity which appears chronic.  IMPRESSION: 1. Mildly displaced fractures of the left inferior pubic ramus and puboacetabular junction with mild to moderate extraperitoneal hematoma. 2. S3 body and left sacral ala fractures without displacement along the foramina. 3. Displaced left supracondylar humerus fracture. 4. No evidence of intrathoracic or intra-abdominal injury. The patient is status post splenectomy.   Electronically Signed   By: Jorje Guild M.D.   On: 11/17/2014 12:34   Ct Cervical Spine Wo Contrast  11/17/2014   CLINICAL DATA:  Acute right arm pain and head injury after fall today.  EXAM: CT HEAD WITHOUT CONTRAST  CT CERVICAL SPINE WITHOUT CONTRAST  TECHNIQUE: Multidetector CT imaging of the head and cervical spine was performed following the standard protocol without intravenous  contrast. Multiplanar CT image reconstructions of the cervical spine were also generated.  COMPARISON:  CT scan of January 15, 2014.  FINDINGS: CT HEAD FINDINGS  Bony calvarium appears intact. Small right frontal scalp hematoma is noted. Mild diffuse cortical atrophy is noted. Mild chronic ischemic white matter disease is noted. No mass effect or midline shift is noted. Ventricular size is within normal limits. There is no evidence  of mass lesion, hemorrhage or acute infarction.  CT CERVICAL SPINE FINDINGS  No fracture or spondylolisthesis is noted. Mild degenerative disc disease is noted at C5-6 and C6-7 with anterior and posterior osteophyte formation. Mild hypertrophy of right posterior facet joint at C4-5 is noted. Visualized lung apices appear normal.  IMPRESSION: Small right frontal scalp hematoma. Mild diffuse cortical atrophy. Mild chronic ischemic white matter disease. No acute intracranial abnormality seen.  Mild degenerative disc disease is noted at C5-6 and C6-7. No acute abnormality seen in the cervical spine.   Electronically Signed   By: Sabino Dick M.D.   On: 11/17/2014 11:01   Dg Humerus Left  11/17/2014   CLINICAL DATA:  Status post fall 11/16/2014 with left upper arm pain.  EXAM: LEFT HUMERUS - 2+ VIEW  COMPARISON:  None.  FINDINGS: The patient has an acute transcondylar fracture of the distal humerus. No other acute bony or joint abnormality is identified.  IMPRESSION: Acute transcondylar fracture distal humerus.   Electronically Signed   By: Inge Rise M.D.   On: 11/17/2014 10:28   Dg Femur Min 2 Views Left  11/17/2014   CLINICAL DATA:  Status post fall 11/16/2014. Left pelvic and upper leg pain. Initial encounter.  EXAM: LEFT FEMUR 2 VIEWS  COMPARISON:  None.  FINDINGS: Acute left superior and inferior pubic ramus fractures are identified. No other acute bony or joint abnormality is seen. No notable degenerative disease about the hip or knee is identified. Atherosclerosis is noted.  IMPRESSION: Acute right superior and inferior pubic ramus fractures.   Electronically Signed   By: Inge Rise M.D.   On: 11/17/2014 10:29    Anti-infectives: Anti-infectives    Start     Dose/Rate Route Frequency Ordered Stop   11/18/14 0630  ceFAZolin (ANCEF) IVPB 1 g/50 mL premix     1 g100 mL/hr over 30 Minutes Intravenous 3 times per day 11/18/14 [id number]        Assessment/Plan: s/p  One unit of blood in  preparation for possible surgery.  Continue CIWA protocol   LOS: 1 day   Kathryne Eriksson. Dahlia Bailiff, MD, FACS [phone] Trauma Surgeon 11/18/2014

## 2014-11-19 ENCOUNTER — Encounter (HOSPITAL_COMMUNITY): Payer: Self-pay | Admitting: Orthopedic Surgery

## 2014-11-19 DIAGNOSIS — E785 Hyperlipidemia, unspecified: Secondary | ICD-10-CM

## 2014-11-19 DIAGNOSIS — F1010 Alcohol abuse, uncomplicated: Secondary | ICD-10-CM

## 2014-11-19 DIAGNOSIS — E43 Unspecified severe protein-calorie malnutrition: Secondary | ICD-10-CM | POA: Insufficient documentation

## 2014-11-19 DIAGNOSIS — N189 Chronic kidney disease, unspecified: Secondary | ICD-10-CM

## 2014-11-19 DIAGNOSIS — F1310 Sedative, hypnotic or anxiolytic abuse, uncomplicated: Secondary | ICD-10-CM

## 2014-11-19 DIAGNOSIS — W19XXXA Unspecified fall, initial encounter: Secondary | ICD-10-CM

## 2014-11-19 DIAGNOSIS — N179 Acute kidney failure, unspecified: Secondary | ICD-10-CM

## 2014-11-19 LAB — CBC WITH DIFFERENTIAL/PLATELET
BASOS ABS: 0 10*3/uL (ref 0.0–0.1)
BASOS PCT: 0 % (ref 0–1)
EOS ABS: 0 10*3/uL (ref 0.0–0.7)
EOS PCT: 0 % (ref 0–5)
HCT: 30.5 % — ABNORMAL LOW (ref 39.0–52.0)
Hemoglobin: 10.3 g/dL — ABNORMAL LOW (ref 13.0–17.0)
Lymphocytes Relative: 9 % — ABNORMAL LOW (ref 12–46)
Lymphs Abs: 1.3 10*3/uL (ref 0.7–4.0)
MCH: 33.7 pg (ref 26.0–34.0)
MCHC: 33.8 g/dL (ref 30.0–36.0)
MCV: 99.7 fL (ref 78.0–100.0)
Monocytes Absolute: 1.2 10*3/uL — ABNORMAL HIGH (ref 0.1–1.0)
Monocytes Relative: 8 % (ref 3–12)
Neutro Abs: 12.7 10*3/uL — ABNORMAL HIGH (ref 1.7–7.7)
Neutrophils Relative %: 83 % — ABNORMAL HIGH (ref 43–77)
PLATELETS: 202 10*3/uL (ref 150–400)
RBC: 3.06 MIL/uL — ABNORMAL LOW (ref 4.22–5.81)
RDW: 20.7 % — AB (ref 11.5–15.5)
WBC: 15.3 10*3/uL — ABNORMAL HIGH (ref 4.0–10.5)

## 2014-11-19 LAB — TYPE AND SCREEN
ABO/RH(D): A POS
ANTIBODY SCREEN: NEGATIVE
UNIT DIVISION: 0
Unit division: 0

## 2014-11-19 LAB — URINE MICROSCOPIC-ADD ON

## 2014-11-19 LAB — OSMOLALITY, URINE: Osmolality, Ur: 581 mOsm/kg (ref 390–1090)

## 2014-11-19 LAB — POCT I-STAT 4, (NA,K, GLUC, HGB,HCT)
Glucose, Bld: 133 mg/dL — ABNORMAL HIGH (ref 70–99)
HCT: 27 % — ABNORMAL LOW (ref 39.0–52.0)
HEMOGLOBIN: 9.2 g/dL — AB (ref 13.0–17.0)
POTASSIUM: 4 mmol/L (ref 3.5–5.1)
Sodium: 135 mmol/L (ref 135–145)

## 2014-11-19 LAB — URINALYSIS, ROUTINE W REFLEX MICROSCOPIC
Bilirubin Urine: NEGATIVE
GLUCOSE, UA: NEGATIVE mg/dL
Ketones, ur: NEGATIVE mg/dL
Nitrite: NEGATIVE
Protein, ur: 30 mg/dL — AB
SPECIFIC GRAVITY, URINE: 1.025 (ref 1.005–1.030)
Urobilinogen, UA: 0.2 mg/dL (ref 0.0–1.0)
pH: 5 (ref 5.0–8.0)

## 2014-11-19 LAB — SODIUM, URINE, RANDOM: SODIUM UR: 103 mmol/L

## 2014-11-19 MED ORDER — MORPHINE SULFATE 2 MG/ML IJ SOLN
2.0000 mg | INTRAMUSCULAR | Status: DC | PRN
  Administered 2014-11-19: 4 mg via INTRAVENOUS
  Administered 2014-11-19 – 2014-11-20 (×3): 2 mg via INTRAVENOUS
  Administered 2014-11-21 – 2014-11-24 (×4): 4 mg via INTRAVENOUS
  Filled 2014-11-19: qty 1
  Filled 2014-11-19: qty 2
  Filled 2014-11-19: qty 1
  Filled 2014-11-19: qty 2
  Filled 2014-11-19: qty 1
  Filled 2014-11-19: qty 2
  Filled 2014-11-19: qty 1
  Filled 2014-11-19: qty 2

## 2014-11-19 MED ORDER — LORAZEPAM 2 MG/ML IJ SOLN
INTRAMUSCULAR | Status: AC
  Filled 2014-11-19: qty 1

## 2014-11-19 MED ORDER — LORAZEPAM 2 MG/ML IJ SOLN
2.0000 mg | INTRAMUSCULAR | Status: DC | PRN
  Administered 2014-11-19: 3 mg via INTRAVENOUS
  Administered 2014-11-20 (×3): 2 mg via INTRAVENOUS
  Administered 2014-11-21: 3 mg via INTRAVENOUS
  Administered 2014-11-21: 1 mg via INTRAVENOUS
  Administered 2014-11-21: 2 mg via INTRAVENOUS
  Administered 2014-11-21: 3 mg via INTRAVENOUS
  Filled 2014-11-19: qty 2
  Filled 2014-11-19 (×5): qty 1
  Filled 2014-11-19: qty 2
  Filled 2014-11-19: qty 1

## 2014-11-19 MED ORDER — MIDAZOLAM HCL 2 MG/2ML IJ SOLN: 1.0000 mg | INTRAMUSCULAR | Status: DC | PRN

## 2014-11-19 MED ORDER — DEXMEDETOMIDINE HCL IN NACL 200 MCG/50ML IV SOLN
0.2000 ug/kg/h | INTRAVENOUS | Status: AC
  Administered 2014-11-19: 1 ug/kg/h via INTRAVENOUS
  Administered 2014-11-19: 1.5 ug/kg/h via INTRAVENOUS
  Administered 2014-11-19: 0.2 ug/kg/h via INTRAVENOUS
  Administered 2014-11-20: 0.8 ug/kg/h via INTRAVENOUS
  Administered 2014-11-20: 1.2 ug/kg/h via INTRAVENOUS
  Filled 2014-11-19 (×6): qty 50

## 2014-11-19 MED ORDER — CETYLPYRIDINIUM CHLORIDE 0.05 % MT LIQD
7.0000 mL | Freq: Two times a day (BID) | OROMUCOSAL | Status: DC
  Administered 2014-11-19 – 2014-11-25 (×10): 7 mL via OROMUCOSAL

## 2014-11-19 MED ORDER — FENTANYL CITRATE 0.05 MG/ML IJ SOLN
25.0000 ug | INTRAMUSCULAR | Status: DC | PRN
  Filled 2014-11-19: qty 2
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
## 2014-11-19 NOTE — Progress Notes (Signed)
OT Cancellation Note  Patient Details Name: RASHEE GLADBACH MRN: [id number] DOB: 06-May-1951   Cancelled Treatment:    Reason Eval/Treat Not Completed: Patient not medically ready - Pt currently sedated on vent.  Will initiate OT when medically ready.   Darlina Rumpf [place], OTR/L [id number]  11/19/2014, 1:55 PM

## 2014-11-19 NOTE — Progress Notes (Signed)
Follow up - Trauma and Critical Care  Patient Details:    Edward Singleton is an 64 y.o. male.  Lines/tubes : Airway 7.5 mm (Active)  Secured at (cm) 23 cm 11/19/2014  8:00 AM  Measured From Lips 11/19/2014  8:00 AM  Secured Location Right 11/19/2014  8:00 AM  Secured By [employer] 11/19/2014  8:00 AM  Tube Holder Repositioned Yes 11/19/2014  8:00 AM  Cuff Pressure (cm H2O) 25 cm H2O 11/19/2014  3:11 AM  Site Condition Dry 11/19/2014  8:00 AM     NG/OG Tube Orogastric 18 Fr. Right mouth (Active)  Placement Verification Auscultation 11/18/2014  8:00 PM  Site Assessment Clean;Dry;Intact 11/18/2014  8:00 PM  Status Clamped 11/18/2014  8:00 PM  Drainage Appearance Yellow;Bile 11/18/2014  8:00 PM     Urethral Catheter nikki potter, rn (Active)  Indication for Insertion or Continuance of Catheter Unstable critical patients (first 24-48 hours) 11/18/2014  7:55 PM  Site Assessment Clean;Intact 11/18/2014  7:55 PM  Catheter Maintenance Bag below level of bladder;Catheter secured;Drainage bag/tubing not touching floor;Insertion date on drainage bag;No dependent loops;Seal intact 11/18/2014  7:55 PM  Collection Container Standard drainage bag 11/18/2014  7:55 PM  Securement Method Leg strap 11/18/2014  7:55 PM  Urinary Catheter Interventions Unclamped 11/18/2014  7:55 PM  Output (mL) 150 mL 11/19/2014  6:00 AM    Microbiology/Sepsis markers: Results for orders placed or performed during the hospital encounter of 11/17/14  MRSA PCR Screening     Status: None   Collection Time: 11/17/14  3:16 PM  Result Value Ref Range Status   MRSA by PCR NEGATIVE NEGATIVE Final    Comment:        The GeneXpert MRSA Assay (FDA approved for NASAL specimens only), is one component of a comprehensive MRSA colonization surveillance program. It is not intended to diagnose MRSA infection nor to guide or monitor treatment for MRSA infections.     Anti-infectives:  Anti-infectives    Start     Dose/Rate Route  Frequency Ordered Stop   11/18/14 2000  ceFAZolin (ANCEF) IVPB 2 g/50 mL premix     2 g 100 mL/hr over 30 Minutes Intravenous 3 times per day 11/18/14 1738 11/19/14 2159   11/18/14 0630  ceFAZolin (ANCEF) IVPB 1 g/50 mL premix  Status:  Discontinued     1 g 100 mL/hr over 30 Minutes Intravenous 3 times per day 11/18/14 [id number] 11/18/14 1738      Best Practice/Protocols:  VTE Prophylaxis: Lovenox (prophylaxtic dose) and Mechanical GI Prophylaxis: Proton Pump Inhibitor Continous Sedation  Consults: Treatment Team:  Rozanna Box, MD Raylene Miyamoto, MD Md Pccm, MD    Events:  Subjective:    Overnight Issues: Patient did not overbreath earlier today.  Seems to be alert on the ventilator.  Did get some ativan abotu 6AM  Objective:  Vital signs for last 24 hours: Temp:  [96.6 F (35.9 C)-98.8 F (37.1 C)] 97.4 F (36.3 C) (02/10 0749) Pulse Rate:  [51-98] 58 (02/10 0800) Resp:  [9-24] 18 (02/10 0800) BP: (102-173)/(61-98) 154/81 mmHg (02/10 0800) SpO2:  [97 %-100 %] 100 % (02/10 0800) FiO2 (%):  [40 %-50 %] 40 % (02/10 0800)  Hemodynamic parameters for last 24 hours:    Intake/Output from previous day: 02/09 0701 - 02/10 0700 In: 3441.1 [P.O.:240; I.V.:2716.1; Blood:335; IV Piggyback:150] Out: 2300 [Urine:2200; Blood:100]  Intake/Output this shift: Total I/O In: 56.7 [I.V.:56.7] Out: -   Vent settings for last 24 hours:  Vent Mode:  [-] PRVC FiO2 (%):  [40 %-50 %] 40 % Set Rate:  [16 bmp] 16 bmp Vt Set:  [440 mL-550 mL] 550 mL PEEP:  [5 cmH20] 5 cmH20 Plateau Pressure:  [14 cmH20-22 cmH20] 16 cmH20  Physical Exam:  General: alert and no respiratory distress Resp: clear to auscultation bilaterally CVS: regular rate and rhythm, S1, S2 normal, no murmur, click, rub or gallop Extremities: no edema, no erythema, pulses WNL and Left arm in splint  Results for orders placed or performed during the hospital encounter of 11/17/14 (from the past 24 hour(s))   Urine rapid drug screen (hosp performed)     Status: Abnormal   Collection Time: 11/18/14 10:08 AM  Result Value Ref Range   Opiates POSITIVE (A) NONE DETECTED   Cocaine NONE DETECTED NONE DETECTED   Benzodiazepines POSITIVE (A) NONE DETECTED   Amphetamines NONE DETECTED NONE DETECTED   Tetrahydrocannabinol NONE DETECTED NONE DETECTED   Barbiturates NONE DETECTED NONE DETECTED  Lactic acid, plasma     Status: None   Collection Time: 11/18/14 10:29 AM  Result Value Ref Range   Lactic Acid, Venous 1.5 0.5 - 2.0 mmol/L  Ammonia     Status: None   Collection Time: 11/18/14 11:22 AM  Result Value Ref Range   Ammonia 25 11 - 32 umol/L  Prepare RBC     Status: None   Collection Time: 11/18/14 12:42 PM  Result Value Ref Range   Order Confirmation ORDER PROCESSED BY BLOOD BANK   CBC     Status: Abnormal   Collection Time: 11/18/14  8:29 PM  Result Value Ref Range   WBC 15.9 (H) 4.0 - 10.5 K/uL   RBC 2.94 (L) 4.22 - 5.81 MIL/uL   Hemoglobin 10.1 (L) 13.0 - 17.0 g/dL   HCT 29.2 (L) 39.0 - 52.0 %   MCV 99.3 78.0 - 100.0 fL   MCH 34.4 (H) 26.0 - 34.0 pg   MCHC 34.6 30.0 - 36.0 g/dL   RDW 20.5 (H) 11.5 - 15.5 %   Platelets 202 150 - 400 K/uL  CBC with Differential/Platelet     Status: Abnormal   Collection Time: 11/19/14  2:31 AM  Result Value Ref Range   WBC 15.3 (H) 4.0 - 10.5 K/uL   RBC 3.06 (L) 4.22 - 5.81 MIL/uL   Hemoglobin 10.3 (L) 13.0 - 17.0 g/dL   HCT 30.5 (L) 39.0 - 52.0 %   MCV 99.7 78.0 - 100.0 fL   MCH 33.7 26.0 - 34.0 pg   MCHC 33.8 30.0 - 36.0 g/dL   RDW 20.7 (H) 11.5 - 15.5 %   Platelets 202 150 - 400 K/uL   Neutrophils Relative % 83 (H) 43 - 77 %   Neutro Abs 12.7 (H) 1.7 - 7.7 K/uL   Lymphocytes Relative 9 (L) 12 - 46 %   Lymphs Abs 1.3 0.7 - 4.0 K/uL   Monocytes Relative 8 3 - 12 %   Monocytes Absolute 1.2 (H) 0.1 - 1.0 K/uL   Eosinophils Relative 0 0 - 5 %   Eosinophils Absolute 0.0 0.0 - 0.7 K/uL   Basophils Relative 0 0 - 1 %   Basophils Absolute 0.0  0.0 - 0.1 K/uL     Assessment/Plan:   NEURO  Altered Mental Status:  agitation and sedation   Plan: Wean fentanyl sedation and try to get extubated  PULM  No significant issues   Plan: CPM, wean to extubate  CARDIO  No signficiant  problems   Plan: CPM  RENAL  Urine output is adequate   Plan: CPM  GI  No specific issues   Plan: CPM  ID  No known infectious source   Plan: CPM with prophylactic antibiotics  HEME  Anemia acute blood loss anemia and anemia of critical illness)   Plan: No blood needed.  ENDO No issues   Plan: CPM  Global Issues  Outside of the amount of sedation that the patient, we should be able to wean the patient easily.  Will work on that and extubate as tolerated.    LOS: 2 days   Additional comments:I reviewed the patient's new clinical lab test results. cbc/bmet  Critical Care Total Time*: 30 Minutes  Jaide Hillenburg, JAY 11/19/2014  *Care during the described time interval was provided by me and/or other providers on the critical care team.  I have reviewed this patient's available data, including medical history, events of note, physical examination and test results as part of my evaluation.

## 2014-11-19 NOTE — Progress Notes (Signed)
INITIAL NUTRITION ASSESSMENT  DOCUMENTATION CODES Per approved criteria  -Severe malnutrition in the context of acute illness or injury   INTERVENTION: If pt remains intubated recommend:  Vital AF 1.2 start @ 15 ml/hr and increase by 10 ml every 12 hours to goal rate of 55 ml/hr (provides: 1584 kcal, 99 grams protein, and 1070 ml H2O.   MVI daily  Recommend monitor magnesium, potassium, and phosphorus daily for at least 3 days, MD to replete as needed, as pt is at risk for refeeding syndrome given malnutrition.   If pt extubated will supplement diet as appropriate.   NUTRITION DIAGNOSIS: Inadequate oral intake related to inability to eat as evidenced by NPO status  Goal: Pt to meet >/= 90% of their estimated nutrition needs   Monitor:  Vent status, Diet advancement, PO intake, weight trends, labs  Reason for Assessment: Pt identified as at nutrition risk on the Malnutrition Screen Tool/Ventilator   64 y.o. male  Admitting Dx: Fall  ASSESSMENT: Pt s/p fall at home with multiple left rib fxs, left distal humerus fx (s/p ORIF), pelvic ring fx. Pt with hx of ETOH and polysubstance abuse. Pt lives at home alone.   Two sisters at bedside. They report that pt gets up early and eats breakfast on the way to work, he does not eat lunch at work, when he gets home from work he starts to drink and usually eats a sandwich for dinner. He lost a lot of weight a year ago, he went from 155 lb to 125 lb but has stayed at the 125 lb since then. For the last 2 1/2 weeks pt has been drinking heavily, missing work, and taking street drugs as well. He has ate even less during this time.   Patient is currently intubated on ventilator support MV: 9 L/min Temp (24hrs), Avg:97.7 F (36.5 C), Min:96.6 F (35.9 C), Max:98.8 F (37.1 C) Plan to attempt extubation today.   Nutrition Focused Physical Exam:  Subcutaneous Fat:  Orbital Region: WDL Upper Arm Region: mild depletion Thoracic and Lumbar  Region: WDL  Muscle:  Temple Region: moderate depletion Clavicle Bone Region: WDL Clavicle and Acromion Bone Region: WDL Scapular Bone Region: NA Dorsal Hand: WDL Patellar Region: moderate depletion Anterior Thigh Region: moderate depletion Posterior Calf Region: WDL  Edema: not present    Height: Ht Readings from Last 1 Encounters:  11/17/14 5\' 7"  (1.702 m)    Weight: Wt Readings from Last 1 Encounters:  11/17/14 165 lb (74.844 kg)  Per sisters weight PTA 125 lb (56.8 kg)  Ideal Body Weight: 67.2  % Ideal Body Weight: 111%  Wt Readings from Last 10 Encounters:  11/17/14 165 lb (74.844 kg)    Usual Body Weight: 155 lb   % Usual Body Weight: >100%  BMI:  Body mass index is 25.84 kg/(m^2).  Estimated Nutritional Needs: Kcal: 1540 Non-vented needs: 1650-1850 Protein: 85-100 grams Fluid: > 1.6 L/day  Skin: incision   Diet Order: Diet NPO time specified  EDUCATION NEEDS: -No education needs identified at this time   Intake/Output Summary (Last 24 hours) at 11/19/14 0938 Last data filed at 11/19/14 0800  Gross per 24 hour  Intake 3107.78 ml  Output   2300 ml  Net 807.78 ml    Last BM: PTA   Labs:   Recent Labs Lab 11/17/14 0926 11/18/14 0342  NA 138 136  K 5.1 4.2  CL 106 109  CO2 19 18*  BUN 21 20  CREATININE 2.24* 1.86*  CALCIUM 8.5 7.9*  GLUCOSE 121* 147*    CBG (last 3)  No results for input(s): GLUCAP in the last 72 hours.  Scheduled Meds: . allopurinol  150 mg Oral Daily  . antiseptic oral rinse  7 mL Mouth Rinse QID  .  ceFAZolin (ANCEF) IV  2 g Intravenous 3 times per day  . chlorhexidine  15 mL Mouth Rinse BID  . docusate sodium  100 mg Oral BID  . enoxaparin (LOVENOX) injection  30 mg Subcutaneous Q24H  . folic acid  1 mg Intravenous Daily  . ipratropium-albuterol  3 mL Nebulization TID  . multivitamin with minerals  1 tablet Oral Daily  . pantoprazole  40 mg Oral Daily   Or  . pantoprazole (PROTONIX) IV  40 mg  Intravenous Daily  . simvastatin  40 mg Oral Daily  . thiamine  100 mg Oral Daily   Or  . thiamine  100 mg Intravenous Daily    Continuous Infusions: . dexmedetomidine 0.2 mcg/kg/hr (11/19/14 0844)  . dextrose 5 % and 0.45% NaCl 50 mL/hr at 11/19/14 0800  . fentaNYL infusion INTRAVENOUS 30 mcg/hr (11/19/14 0844)  . lactated ringers 10 mL/hr at 11/18/14 1237    Past Medical History  Diagnosis Date  . Hypertension   . Hypercholesteremia   . Gout     Past Surgical History  Procedure Laterality Date  . Nephrectomy      [place], [place], [place] Pager [phone] After Hours Pager

## 2014-11-19 NOTE — Progress Notes (Signed)
Wasted 190 ml of Fentanyl in sink.  Dulcy Fanny, RN was witness. [place], [place]

## 2014-11-19 NOTE — Progress Notes (Signed)
[facility] Progress Note  Subjective  On vent   Review of Systems  Unable to perform ROS: intubated     Objective   BP 152/72 mmHg  Pulse 51  Temp(Src) 97.4 F (36.3 C) (Axillary)  Resp 16  Ht 5\' 7"  (1.702 m)  Wt 74.844 kg (165 lb)  BMI 25.84 kg/m2  SpO2 100%  Intake/Output      02/09 0701 - 02/10 0700 02/10 0701 - 02/11 0700   P.O. 240    I.V. (mL/kg) 2716.1 (36.3)    Blood 335    IV Piggyback 150    Total Intake(mL/kg) 3441.1 (46)    Urine (mL/kg/hr) 2200 (1.2)    Blood 100 (0.1)    Total Output 2300     Net +1141.1            Labs  Results for STERLYN, ROMO (MRN [id number]) as of 11/19/2014 08:25  Ref. Range 11/19/2014 02:31  WBC Latest Range: 4.0-10.5 K/uL 15.3 (H)  RBC Latest Range: 4.22-5.81 MIL/uL 3.06 (L)  Hemoglobin Latest Range: 13.0-17.0 g/dL 10.3 (L)  HCT Latest Range: 39.0-52.0 % 30.5 (L)  MCV Latest Range: 78.0-100.0 fL 99.7  MCH Latest Range: 26.0-34.0 pg 33.7  MCHC Latest Range: 30.0-36.0 g/dL 33.8  RDW Latest Range: 11.5-15.5 % 20.7 (H)  Platelets Latest Range: 150-400 K/uL 202  Neutrophils Relative % Latest Range: 43-77 % 83 (H)  Lymphocytes Relative Latest Range: 12-46 % 9 (L)  Monocytes Relative Latest Range: 3-12 % 8  Eosinophils Relative Latest Range: 0-5 % 0  Basophils Relative Latest Range: 0-1 % 0  NEUT# Latest Range: 1.7-7.7 K/uL 12.7 (H)  Lymphocytes Absolute Latest Range: 0.7-4.0 K/uL 1.3  Monocytes Absolute Latest Range: 0.1-1.0 K/uL 1.2 (H)  Eosinophils Absolute Latest Range: 0.0-0.7 K/uL 0.0  Basophils Absolute Latest Range: 0.0-0.1 K/uL 0.0    Exam  Gen: on Vent, FC  Pelvis: no instability  Ext:       Left upper extremity   Splint fitting well  R/U/M sensation grossly intact  R/U/M/AIN/PIN motor intact  Ext warm   Brisk cap refill   Swelling controlled    Assessment and Plan   POD/HD#: 1  64 y/o male s/p fall with L distal humerus fracture  1. Fall  2. L distal humerus fracture s/p  ORIF  NWB L upper extremity   Splint until extubated  Will not have any ROM restrictions once splint removed  Fracture did look subacute in OR   Ice and elevate  PT/OT once extubated   3. Left LC1 pelvic ring fracture  Will be WBAT B once extubated  Will check baseline AP, inlet/outlet films and then repeat after mobilized  4. Anemia  Improved after 2 units in OR  Monitor   5. Vent  Per TS  6. DVT/PE prophylaxis  SCD's  Ok for lovenox from ortho standpoint   7. Medical issues  Per IM  8. Dispo  Ortho issues addressed  Continue per TS    Jari Pigg, PA-C [facility] [phone] (O) 11/19/2014 8:25 AM

## 2014-11-19 NOTE — Consult Note (Signed)
PULMONARY / CRITICAL CARE MEDICINE   Name: Edward Singleton MRN: [id number] DOB: September 09, 1951    ADMISSION DATE:  11/17/2014 CONSULTATION DATE:  11/19/2014  REFERRING MD :  Trauma  CHIEF COMPLAINT:  Vent Management  INITIAL PRESENTATION:  64 y.o. M brought to [facility] ED 2/8 after suffering a mechanical fall at home.  Imaging in ED revealed multiple fractures as outlined below.  He was admitted by trauma and seen in consultation by ortho who took him to OR for ORIF of left humerus fx on 2/9.  He returned to ICU on vent and due to increasing ativan requirements for ETOH withdrawal, PCCM consulted for precedex 2/10.  STUDIES:  CXR 2/8 >>> mildly displaced left 7th rib fx L Forearm / Humerus XR 2/8 >>> subactue displaced supracondylar humerus fx, acute transcondylar fx CT Head  / C Spine 2/8 >>> small R frontal scalp hematoma, mild degenerative disc is noted at C5 - C7. CT Chest 2/8 >>> mildly displaced fx left inferior pubic ramus and puboacetabular junction with mild to mod hematoma, S3 body and left sacral ala fx's without displacement along the foramina, displaced left supracondylar humerus fx.  SIGNIFICANT EVENTS: 2/8 - admitted 2/9 - 1u PRBC's. OR for ORIF of left distal humerus fx 2/10 - PCCM consulted as medical consultant  HISTORY OF PRESENT ILLNESS:  Pt is encephalopathic; therefore, this HPI is obtained from chart review. Edward Singleton is a 64 y.o. M with PMH as outlined below.  He was brought to [facility] ED 2/08 after suffering a mechanical fall at home one day prior.  He did not lose consciousness but reportedly had to sit and rest for approx 2 hours after the fall.  He woke up the following morning and attempted to go to work; however, was in too much of pain so decided to go to the ED for further evaluation. In ED, imaging revealed multiple fractures as outlined above.  He was taken to the OR for surgical repairs as outlined above. Following OR, he returned to the ICU on the ventilator and had  increasing requirements of ativan due to ETOH withdrawal; therefore, PCCM was consulted for vent management and initiation of precedex.  He reportedly has polysubstance abuse involving alcohol and benzo's.  PAST MEDICAL HISTORY :   has a past medical history of Hypertension; Hypercholesteremia; and Gout.  has past surgical history that includes Nephrectomy. Prior to Admission medications   Medication Sig Start Date End Date Taking? Authorizing Provider  allopurinol (ZYLOPRIM) 300 MG tablet Take 150 mg by mouth daily.    Yes Historical Provider, MD  oxyCODONE-acetaminophen (PERCOCET/ROXICET) 5-325 MG per tablet Take 2 tablets by mouth every 4 (four) hours as needed for pain. Patient not taking: Reported on 11/17/2014 08/05/13   Shaune Pollack, MD  simvastatin (ZOCOR) 40 MG tablet Take 40 mg by mouth daily. 11/15/14  Yes Historical Provider, MD   No Known Allergies  FAMILY HISTORY:  History reviewed. No pertinent family history.  SOCIAL HISTORY:  reports that he has been smoking.  He does not have any smokeless tobacco history on file. He reports that he drinks alcohol. His drug history is not on file.  REVIEW OF SYSTEMS:  Unable to complete as pt is intubated.  SUBJECTIVE:   VITAL SIGNS: Temp:  [96.6 F (35.9 C)-98.8 F (37.1 C)] 97.4 F (36.3 C) (02/10 0749) Pulse Rate:  [51-81] 69 (02/10 1115) Resp:  [14-24] 18 (02/10 1115) BP: (103-173)/(61-98) 154/81 mmHg (02/10 0800) SpO2:  [97 %-100 %]  100 % (02/10 1115) FiO2 (%):  [35 %-50 %] 35 % (02/10 1115) HEMODYNAMICS:   VENTILATOR SETTINGS: Vent Mode:  [-] CPAP;PSV FiO2 (%):  [35 %-50 %] 35 % Set Rate:  [16 bmp] 16 bmp Vt Set:  [440 mL-550 mL] 550 mL PEEP:  [5 cmH20] 5 cmH20 Pressure Support:  [5 cmH20] 5 cmH20 Plateau Pressure:  [14 cmH20-22 cmH20] 16 cmH20 INTAKE / OUTPUT: Intake/Output      02/09 0701 - 02/10 0700 02/10 0701 - 02/11 0700   P.O. 240    I.V. (mL/kg) 2716.1 (36.3) 56.7 (0.8)   Blood 335    IV Piggyback 150     Total Intake(mL/kg) 3441.1 (46) 56.7 (0.8)   Urine (mL/kg/hr) 2200 (1.2)    Blood 100 (0.1)    Total Output 2300     Net +1141.1 +56.7          PHYSICAL EXAMINATION: General: Elderly chronically ill appearing male, in NAD. Neuro: Awake on vent, opens eyes, MAE's. HEENT: [place]/AT. PERRL, sclerae anicteric.  Superficial scalp abrasions. Cardiovascular: RRR, no M/R/G.  Lungs: Respirations even and unlabored.  CTA bilaterally, No W/R/R. Abdomen: BS x 4, soft, NT/ND.  Musculoskeletal: LUE in splint / sling.  No edema.  Skin: Intact, warm, no rashes.  LABS:  CBC  Recent Labs Lab 11/18/14 0342 11/18/14 2029 11/19/14 0231  WBC 14.9* 15.9* 15.3*  HGB 7.3* 10.1* 10.3*  HCT 21.5* 29.2* 30.5*  PLT 229 202 202   Coag's No results for input(s): APTT, INR in the last 168 hours. BMET  Recent Labs Lab 11/17/14 0926 11/18/14 0342  NA 138 136  K 5.1 4.2  CL 106 109  CO2 19 18*  BUN 21 20  CREATININE 2.24* 1.86*  GLUCOSE 121* 147*   Electrolytes  Recent Labs Lab 11/17/14 0926 11/18/14 0342  CALCIUM 8.5 7.9*   Sepsis Markers  Recent Labs Lab 11/17/14 0936 11/17/14 1205 11/18/14 1029  LATICACIDVEN 3.7* 1.5 1.5   ABG No results for input(s): PHART, PCO2ART, PO2ART in the last 168 hours. Liver Enzymes  Recent Labs Lab 11/17/14 0926 11/18/14 0342  AST 45* 41*  ALT 24 17  ALKPHOS 115 83  BILITOT 1.7* 0.8  ALBUMIN 3.5 2.6*   Cardiac Enzymes No results for input(s): TROPONINI, PROBNP in the last 168 hours. Glucose No results for input(s): GLUCAP in the last 168 hours.  Imaging Dg Elbow 2 Views Left  11/18/2014   CLINICAL DATA:  Distal humeral fracture, left, closed, initial encounter.  EXAM: LEFT ELBOW - 2 VIEW  COMPARISON:  Same day.  FINDINGS: Status post surgical internal fixation of distal left humeral fracture. Good alignment of fracture components is noted. The joint has been casted and immobilized.  IMPRESSION: Status post surgical internal fixation of  distal left humeral fracture.   Electronically Signed   By: Marijo Conception, M.D.   On: 11/18/2014 20:17   Dg Elbow 2 Views Left  11/18/2014   CLINICAL DATA:  Open reduction and internal fixation of distal left humeral fracture.  EXAM: LEFT ELBOW - 2 VIEW; DG C-ARM 61-120 MIN  COMPARISON:  November 17, 2014.  FINDINGS: Three intraoperative fluoroscopic images of the distal left humerus were submitted for review. Fluoroscopy time was 1 minutes 27 seconds. This image demonstrates surgical internal fixation of distal left humeral fracture with improved alignment of the fracture components.  IMPRESSION: Status post surgical internal fixation of distal left humeral fracture.   Electronically Signed   By: Marijo Conception,  M.D.   On: 11/18/2014 16:34   Dg Chest Port 1 View  11/18/2014   CLINICAL DATA:  Encounter for intubation.  EXAM: PORTABLE CHEST - 1 VIEW  COMPARISON:  Same day.  FINDINGS: Endotracheal tube is in grossly good position with distal tip 6 cm above the carina. Nasogastric tube is seen entering the stomach. No pneumothorax or significant pleural effusion is noted. Left lung is clear. Mild right basilar opacity is noted concerning for subsegmental atelectasis or edema. Left seventh rib fracture is again noted.  IMPRESSION: Endotracheal and nasogastric tubes in grossly good position. Increased right basilar opacity is noted concerning for atelectasis or edema.   Electronically Signed   By: Marijo Conception, M.D.   On: 11/18/2014 18:38   Dg Chest Port 1 View  11/18/2014   CLINICAL DATA:  Left seventh rib fracture  EXAM: PORTABLE CHEST - 1 VIEW  COMPARISON:  November 17, 2014 chest radiograph and chest CT  FINDINGS: The fracture of the anterior left seventh rib is slightly displaced but stable. There is a skin fold on the left but no apparent pneumothorax. There is no edema or consolidation. The heart size and pulmonary vascularity are normal. No adenopathy.  IMPRESSION: Slightly displaced left seventh rib  fracture. Skin fold on left but no apparent pneumothorax. No edema or consolidation.   Electronically Signed   By: Lowella Grip III M.D.   On: 11/18/2014 07:59   Dg C-arm 61-120 Min  11/18/2014   CLINICAL DATA:  Open reduction and internal fixation of distal left humeral fracture.  EXAM: LEFT ELBOW - 2 VIEW; DG C-ARM 61-120 MIN  COMPARISON:  November 17, 2014.  FINDINGS: Three intraoperative fluoroscopic images of the distal left humerus were submitted for review. Fluoroscopy time was 1 minutes 27 seconds. This image demonstrates surgical internal fixation of distal left humeral fracture with improved alignment of the fracture components.  IMPRESSION: Status post surgical internal fixation of distal left humeral fracture.   Electronically Signed   By: Marijo Conception, M.D.   On: 11/18/2014 16:34    ASSESSMENT / PLAN:  PULMONARY OETT 2/9 >>> A: Acute hypoxic respiratory failure following surgery P:   PS 5/5 with goal extubation this PM. - per trauma MD Continue VAP bundle. CXR intermittently.  CARDIOVASCULAR A:  Hx HTN - not on outpatient therapy HLD P:  Continue outpatient simvastatin. 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  SCr improving with hydration P:   D5 1/2NS @ 50. BMP in AM. Improved swoly and anticipate further resolution assess ua, urine na, osm to re assess volume status Continue to allow pos balance  GASTROINTESTINAL A:   GI prophylaxis Nutrition P:   SUP: Pantoprazole. NPO for now until hopeful extubation this PM.  HEMATOLOGIC A:   Anemia - s/p 1u PRBC 2/8 VTE Prophylaxis P:  Transfuse per usual ICU guidelines. SCD's / Lovenox. CBC in AM.  INFECTIOUS A:   Leukocytosis - no indication of infection; likely acute phase reactant P:   Monitor clinically.  ENDOCRINE A:   Mild hyperglycemia on BMP  P:   SSI if glucose consistently > 180.  NEUROLOGIC A:   Acute metabolic  encephalopathy improved Polysubstance abuse - reportedly primarily ETOH and benzo's Post op pain Etoh WD  P:   Sedation:  Precedex gtt / Fentanyl gtt / Versed PRN. Dc ciwa and ativan as RN directed Use MD directed treatment Can  wean to extubate ON precedex, limit to less than 1.0 if needed RASS goal: 0 to -1. Daily WUA. Thiamine / Folate / Multivitamin. D/c Morphine for now given that pt is on fentanyl gtt and fentanyl PRN.  MUSCULOSKELETAL A: L distal humerus fx s/p ORIF 2/9 (Dr. Marcelino Scot) Left LC1 pelvic ring fx Left 7th rib fx P: Post op management per ortho service.   Family updated: 2 sisters at bedside.  Interdisciplinary Family Meeting v Palliative Care Meeting:  Due by: 2/15.   Montey Hora, [place] [facility] Pgr: [phone]  or [phone] 11/19/2014, 11:25 AM   STAFF NOTE: I, Merrie Roof, MD FACP have personally reviewed patient's available data, including medical history, events of note, physical examination and test results as part of my evaluation. I have discussed with resident/NP and other care providers such as pharmacist, RN and RRT. In addition, I personally evaluated patient and elicited key findings of: weaning per trauma, dc ciwa, dc ativan, add MD driven versed, precedex, ok to wean and extubate ON precedex, assess volume status further, crt improving, atn from blood loss in setting CRI The patient is critically ill with multiple organ systems failure and requires high complexity decision making for assessment and support, frequent evaluation and titration of therapies, application of advanced monitoring technologies and extensive interpretation of multiple databases.   Critical Care Time devoted to patient care services described in this note is30 Minutes. This time reflects time of care of this signee: Merrie Roof, MD FACP. This critical care time does not reflect procedure time, or teaching time or  supervisory time of PA/NP/Med student/Med Resident etc but could involve care discussion time. Rest per NP/medical resident whose note is outlined above and that I agree with   Lavon Paganini. Titus Mould, MD, [place] Pgr: [place] [facility] 11/19/2014 2:02 PM

## 2014-11-19 NOTE — Procedures (Signed)
Extubation Procedure Note  Patient Details:   Name: DIANNE RINEHIMER DOB: 1950/10/29 MRN: [id number]   Airway Documentation:  Airway 7.5 mm (Active)  Secured at (cm) 23 cm 11/19/2014 11:15 AM  Measured From Lips 11/19/2014 11:15 AM  [place] 11/19/2014 11:15 AM  Secured By [employer] 11/19/2014 11:15 AM  Tube Holder Repositioned Yes 11/19/2014 11:15 AM  Cuff Pressure (cm H2O) 25 cm H2O 11/19/2014  3:11 AM  Site Condition Dry 11/19/2014 11:15 AM  Pt extubated to 4lpm , tolerated well.  Evaluation  O2 sats: stable throughout Complications: No apparent complications Patient did tolerate procedure well. Bilateral Breath Sounds: Clear, Diminished Suctioning: Airway Yes  Donella Stade 11/19/2014, 2:23 PM

## 2014-11-19 NOTE — Progress Notes (Signed)
Physical medicine rehabilitation consult requested and chart reviewed. Physical and occupational therapy evaluations are pending. Will await formal therapy evaluations to be completed and then follow-up with appropriate recommendations with formal rehabilitation consult

## 2014-11-19 NOTE — Progress Notes (Signed)
SLP Cancellation Note  Patient Details Name: Edward Singleton MRN: [id number] DOB: 07/10/1951   Cancelled treatment:        Pt required ventilation since swallow assessment ordered. Possibly shorter term vent (?). Will follow up next date.   Houston Siren 11/19/2014, 9:14 AM   Orbie Pyo Colvin Caroli.Ed [employer] [phone]

## 2014-11-19 NOTE — Op Note (Signed)
[name], MCPHIE NO.:  [id number]  MEDICAL RECORD NO.:  [id number]  LOCATION:  [id number]                        FACILITY:  [facility]  PHYSICIAN:  Astrid Divine. Marcelino Scot, M.D. DATE OF BIRTH:  1951/02/09  DATE OF PROCEDURE:  11/18/2014 DATE OF DISCHARGE:                              OPERATIVE REPORT   PREOPERATIVE DIAGNOSIS:  Left distal humerus fracture.  POSTOPERATIVE DIAGNOSIS:  Left distal humerus fracture.  PROCEDURE:  Open reduction and internal fixation of left distal humerus supracondylar fracture.  SURGEON:  Astrid Divine. Marcelino Scot, M.D.  ASSISTANT:  Jari Pigg, PA-C.  ANESTHESIA:  General.  COMPLICATIONS:  None.  I/O:  2500 mL/UOP 1200 mL.  EBL:  100 mL.  TOTAL TOURNIQUET TIME:  130 minutes.  DISPOSITION:  To PACU.  CONDITION:  Stable.  BRIEF SUMMARY OF INDICATION FOR PROCEDURE:  Edward Singleton is a 64 year old right-hand-dominant male with history of alcohol abuse, who sustained a left distal humerus fracture prior to presentation to the hospital.  The patient in addition to a sister both discussed the risks and benefits of surgical repair preoperatively including the possibility of nerve injury, vessel injury, failure of fixation, loss of motion, malunion, nonunion, arthritis, and need for further surgery among others.  The patient and his sister wished to proceed with internal fixation.  BRIEF SUMMARY OF PROCEDURE:  Edward Singleton was taken to the operating room where general anesthesia was induced.  He was positioned left side up with his arm over a bone foam pillow.  Standard posterior approach was made to the distal humerus and the ulnar nerve identified on the medial side and dissected free.  The radial fascia incised to expose the posterolateral aspect of the distal humerus and this was traced proximally.  The fracture site was carefully evaluated and developed. There was no hematoma, serious and synovial-like fluid consistent with subacute  rather than acute injury.  The bone similarly had areas of wear with substantial loss and some areas measuring close to 8 mm.  The cortex was intact laterally and was able to use this as well as the anterior bone as a key in measure of reduction.  The lateral column was better preserved particularly along the edge.  I achieved provisional reduction with large tenaculum and then K-wires.  This was followed by plate fixation, securing these in the proper location with small fragment fixation distal to the fracture and large fragment fixation proximal.  Plate position and reduction was then checked with orthogonal fluoroscopic images, we then followed was additional standard and locked fixation.  The large bone defect was grafted with an InFuse sponge pushed into this area.  It was quite stable and a small portion of the sponge was used on the lateral side where it similarly was placed posteriorly.  No InFuse sponge was placed anteriorly to reduce the chance of heterotopic bone formation in any sort of biomechanical block that could develop.  Wound was irrigated thoroughly and closed in standard layered fashion.  I did oppose the deep tissues directly over both the InFuse sponge and the plate medially so that the plate would not make direct contact with the nerve.  This was done with  an inverted monofilament to keep the knot away from the nerve.  It was left in the ulnar sulcus.  Standard layered closure and then a posterior splint with a strut was applied.  Edward Spinner, PA-C did assist me throughout and assistant was absolutely necessary for this procedure, both to protect the nerve as well as to achieve provisional and definitive reduction. It should be noted that the patient also had several large skin abrasions and a full-thickness loss down to subcutaneous tissue over the lateral aspect of the forearm that measured 3 x 3 cm.  It was quite edematous and not be appropriate for skin grafting  at this time.  PROGNOSIS:  Edward Singleton is at high risk for multiple complications including loss of motion, loss of fixation, heterotopic bone given the subacute repair of his elbow and noncompliance with any restrictions because of his significant alcohol history.  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     Astrid Divine. Marcelino Scot, M.D.     MHH/MEDQ  D:  11/18/2014  T:  11/19/2014  Job:  [id number]

## 2014-11-20 ENCOUNTER — Inpatient Hospital Stay (HOSPITAL_COMMUNITY): Payer: 59

## 2014-11-20 LAB — BASIC METABOLIC PANEL
ANION GAP: 7 (ref 5–15)
Anion gap: 7 (ref 5–15)
BUN: 8 mg/dL (ref 6–23)
BUN: 7 mg/dL (ref 6–23)
CALCIUM: 7.5 mg/dL — AB (ref 8.4–10.5)
CHLORIDE: 110 mmol/L (ref 96–112)
CO2: 22 mmol/L (ref 19–32)
CO2: 23 mmol/L (ref 19–32)
CREATININE: 1.32 mg/dL (ref 0.50–1.35)
Calcium: 7.9 mg/dL — ABNORMAL LOW (ref 8.4–10.5)
Chloride: 109 mmol/L (ref 96–112)
Creatinine, Ser: 1.35 mg/dL (ref 0.50–1.35)
GFR calc Af Amer: 65 mL/min — ABNORMAL LOW (ref >90)
GFR calc non Af Amer: 56 mL/min — ABNORMAL LOW (ref >90)
GFR calc non Af Amer: 55 mL/min — ABNORMAL LOW (ref >90)
GFR, EST AFRICAN AMERICAN: 63 mL/min — AB (ref >90)
GLUCOSE: 139 mg/dL — AB (ref 70–99)
GLUCOSE: 132 mg/dL — AB (ref 70–99)
POTASSIUM: 3.9 mmol/L (ref 3.5–5.1)
Potassium: 3.6 mmol/L (ref 3.5–5.1)
Sodium: 138 mmol/L (ref 135–145)
Sodium: 140 mmol/L (ref 135–145)

## 2014-11-20 LAB — CBC WITH DIFFERENTIAL/PLATELET
Basophils Absolute: 0 10*3/uL (ref 0.0–0.1)
Basophils Relative: 0 % (ref 0–1)
Eosinophils Absolute: 0.1 10*3/uL (ref 0.0–0.7)
Eosinophils Relative: 0 % (ref 0–5)
HEMATOCRIT: 30.1 % — AB (ref 39.0–52.0)
HEMOGLOBIN: 10.2 g/dL — AB (ref 13.0–17.0)
LYMPHS ABS: 1.9 10*3/uL (ref 0.7–4.0)
LYMPHS PCT: 10 % — AB (ref 12–46)
MCH: 34.9 pg — ABNORMAL HIGH (ref 26.0–34.0)
MCHC: 33.9 g/dL (ref 30.0–36.0)
MCV: 103.1 fL — ABNORMAL HIGH (ref 78.0–100.0)
MONO ABS: 1.9 10*3/uL — AB (ref 0.1–1.0)
Monocytes Relative: 11 % (ref 3–12)
Neutro Abs: 14.3 10*3/uL — ABNORMAL HIGH (ref 1.7–7.7)
Neutrophils Relative %: 79 % — ABNORMAL HIGH (ref 43–77)
Platelets: 229 10*3/uL (ref 150–400)
RBC: 2.92 MIL/uL — AB (ref 4.22–5.81)
RDW: 19.4 % — ABNORMAL HIGH (ref 11.5–15.5)
WBC: 18.2 10*3/uL — AB (ref 4.0–10.5)

## 2014-11-20 MED ORDER — ENSURE COMPLETE PO LIQD
237.0000 mL | Freq: Two times a day (BID) | ORAL | Status: DC
  Administered 2014-11-21 – 2014-11-25 (×8): 237 mL via ORAL

## 2014-11-20 MED ORDER — HALOPERIDOL LACTATE 5 MG/ML IJ SOLN
5.0000 mg | Freq: Four times a day (QID) | INTRAMUSCULAR | Status: DC | PRN
Start: 2014-11-20 — End: 2014-11-25
  Administered 2014-11-20 – 2014-11-25 (×2): 5 mg via INTRAVENOUS
  Filled 2014-11-20 (×2): qty 1

## 2014-11-20 NOTE — Progress Notes (Signed)
Trauma Service Note  Subjective: Patient is oversedated and somnolent.  Will respond to nipple stimulation   Objective: Vital signs in last 24 hours: Temp:  [97.3 F (36.3 C)-99.8 F (37.7 C)] 97.4 F (36.3 C) (02/11 0346) Pulse Rate:  [62-116] 64 (02/11 0700) Resp:  [11-26] 13 (02/11 0700) BP: (92-161)/(45-95) 136/82 mmHg (02/11 0700) SpO2:  [89 %-100 %] 95 % (02/11 0700) FiO2 (%):  [35 %] 35 % (02/10 1115)    Intake/Output from previous day: 02/10 0701 - 02/11 0700 In: 1657.7 [P.O.:120; I.V.:1487.7; IV Piggyback:50] Out: 2000 [Urine:2000] Intake/Output this shift:    General: Snoring and sleeping.  Lungs: Obstructed airway with pillow underneath the head.  Opens up with chin lift.  Clear when open  Abd: Benign  Extremities: Oozing a lot of fluid from left elbow and upper extremity.  Neuro:  A little more sleepy with sedation.  Lab Results: CBC   Recent Labs  11/19/14 0231 11/20/14 0351  WBC 15.3* 18.2*  HGB 10.3* 10.2*  HCT 30.5* 30.1*  PLT 202 229   BMET  Recent Labs  11/18/14 0342 11/18/14 1557 11/20/14 0351  NA 136 135 138  K 4.2 4.0 3.6  CL 109  --  109  CO2 18*  --  22  GLUCOSE 147* 133* 139*  BUN 20  --  8  CREATININE 1.86*  --  1.32  CALCIUM 7.9*  --  7.5*   PT/INR No results for input(s): LABPROT, INR in the last 72 hours. ABG  Recent Labs  11/17/14 1215  HCO3 19.1*    Studies/Results: Dg Elbow 2 Views Left  11/18/2014   CLINICAL DATA:  Distal humeral fracture, left, closed, initial encounter.  EXAM: LEFT ELBOW - 2 VIEW  COMPARISON:  Same day.  FINDINGS: Status post surgical internal fixation of distal left humeral fracture. Good alignment of fracture components is noted. The joint has been casted and immobilized.  IMPRESSION: Status post surgical internal fixation of distal left humeral fracture.   Electronically Signed   By: Marijo Conception, M.D.   On: 11/18/2014 20:17   Dg Elbow 2 Views Left  11/18/2014   CLINICAL DATA:  Open  reduction and internal fixation of distal left humeral fracture.  EXAM: LEFT ELBOW - 2 VIEW; DG C-ARM 61-120 MIN  COMPARISON:  November 17, 2014.  FINDINGS: Three intraoperative fluoroscopic images of the distal left humerus were submitted for review. Fluoroscopy time was 1 minutes 27 seconds. This image demonstrates surgical internal fixation of distal left humeral fracture with improved alignment of the fracture components.  IMPRESSION: Status post surgical internal fixation of distal left humeral fracture.   Electronically Signed   By: Marijo Conception, M.D.   On: 11/18/2014 16:34   Dg Chest Port 1 View  11/18/2014   CLINICAL DATA:  Encounter for intubation.  EXAM: PORTABLE CHEST - 1 VIEW  COMPARISON:  Same day.  FINDINGS: Endotracheal tube is in grossly good position with distal tip 6 cm above the carina. Nasogastric tube is seen entering the stomach. No pneumothorax or significant pleural effusion is noted. Left lung is clear. Mild right basilar opacity is noted concerning for subsegmental atelectasis or edema. Left seventh rib fracture is again noted.  IMPRESSION: Endotracheal and nasogastric tubes in grossly good position. Increased right basilar opacity is noted concerning for atelectasis or edema.   Electronically Signed   By: Marijo Conception, M.D.   On: 11/18/2014 18:38   Dg C-arm 61-120 Min  11/18/2014  CLINICAL DATA:  Open reduction and internal fixation of distal left humeral fracture.  EXAM: LEFT ELBOW - 2 VIEW; DG C-ARM 61-120 MIN  COMPARISON:  November 17, 2014.  FINDINGS: Three intraoperative fluoroscopic images of the distal left humerus were submitted for review. Fluoroscopy time was 1 minutes 27 seconds. This image demonstrates surgical internal fixation of distal left humeral fracture with improved alignment of the fracture components.  IMPRESSION: Status post surgical internal fixation of distal left humeral fracture.   Electronically Signed   By: Marijo Conception, M.D.   On: 11/18/2014 16:34     Anti-infectives: Anti-infectives    Start     Dose/Rate Route Frequency Ordered Stop   11/18/14 2000  ceFAZolin (ANCEF) IVPB 2 g/50 mL premix     2 g 100 mL/hr over 30 Minutes Intravenous 3 times per day 11/18/14 1738 11/19/14 1506   11/18/14 0630  ceFAZolin (ANCEF) IVPB 1 g/50 mL premix  Status:  Discontinued     1 g 100 mL/hr over 30 Minutes Intravenous 3 times per day 11/18/14 0626 11/18/14 1738      Assessment/Plan: s/p Procedure(s): OPEN REDUCTION INTERNAL FIXATION (ORIF) DISTAL HUMERUS FRACTURE d/c foley Advance diet Continue therapy  Hold on sedation a bit Add Haldol as additional sedation for severe agitation   LOS: 3 days   Kathryne Eriksson. Dahlia Bailiff, MD, FACS [phone] Trauma Surgeon 11/20/2014

## 2014-11-20 NOTE — Progress Notes (Signed)
Patient extremely agitated, confused, trying to climb OOB with restraints. CIWA 19. Patient states he "drinks a lot of bourbon when he can". Precedex maxed out per order at 0.44mcg. Paged Dr Gershon Crane. Order to titrate up on precedex, parameter changed to 1.35mcg. Also order for ativan PRN. Patient placed on sitter list. Will continue to monitor.

## 2014-11-20 NOTE — Progress Notes (Signed)
NUTRITION FOLLOW-UP  DOCUMENTATION CODES Per approved criteria  -Severe malnutrition in the context of acute illness or injury   INTERVENTION:  MVI daily  Ensure Complete po BID, each supplement provides 350 kcal and 13 grams of protein  NUTRITION DIAGNOSIS: Inadequate oral intake related to decreased appetite as evidenced by meal completion <50%; ongoing.   Goal: Pt to meet >/= 90% of their estimated nutrition needs; not met.   Monitor:  PO intake, supplement acceptance, weight trends, labs  ASSESSMENT: Pt s/p fall at home with multiple left rib fxs, left distal humerus fx (s/p ORIF), pelvic ring fx. Pt with hx of ETOH and polysubstance abuse. Pt lives at home alone.   Pt extubated 2/10, started on Regular diet 2/11, still somewhat lethargic. Rehab evaluating pt.    Height: Ht Readings from Last 1 Encounters:  11/17/14 _0  (1.702 m)    Weight: Wt Readings from Last 1 Encounters:  11/17/14 165 lb (74.844 kg)  Per sisters weight PTA 125 lb (56.8 kg)  Ideal Body Weight: 67.2  % Ideal Body Weight: 111%  Wt Readings from Last 10 Encounters:  11/17/14 165 lb (74.844 kg)    Usual Body Weight: 155 lb   % Usual Body Weight: >100%  BMI:  Body mass index is 25.84 kg/(m^2).  Estimated Nutritional Needs: Kcal: 1540 Non-vented needs: 1650-1850 Protein: 85-100 grams Fluid: > 1.6 L/day  Skin: incision   Diet Order: Diet regular  EDUCATION NEEDS: -No education needs identified at this time   Intake/Output Summary (Last 24 hours) at 11/20/14 1604 Last data filed at 11/20/14 1530  Gross per 24 hour  Intake 1769.29 ml  Output   1950 ml  Net -180.71 ml    Last BM: PTA   Labs:   Recent Labs Lab 11/18/14 0342 11/18/14 1557 11/20/14 0351 11/20/14 1133  NA 136 135 138 140  K 4.2 4.0 3.6 3.9  CL 109  --  109 110  CO2 18*  --  22 23  BUN 20  --  8 7  CREATININE 1.86*  --  1.32 1.35  CALCIUM 7.9*  --  7.5* 7.9*  GLUCOSE 147* 133* 139* 132*    CBG  (last 3)  No results for input(s): GLUCAP in the last 72 hours.  Scheduled Meds: . allopurinol  150 mg Oral Daily  . antiseptic oral rinse  7 mL Mouth Rinse BID  . docusate sodium  100 mg Oral BID  . enoxaparin (LOVENOX) injection  30 mg Subcutaneous Q24H  . folic acid  1 mg Intravenous Daily  . ipratropium-albuterol  3 mL Nebulization TID  . multivitamin with minerals  1 tablet Oral Daily  . pantoprazole  40 mg Oral Daily   Or  . pantoprazole (PROTONIX) IV  40 mg Intravenous Daily  . simvastatin  40 mg Oral Daily  . thiamine  100 mg Oral Daily   Or  . thiamine  100 mg Intravenous Daily    Continuous Infusions: . dextrose 5 % and 0.45% NaCl 50 mL/hr at 11/20/14 1500  . fentaNYL infusion INTRAVENOUS Stopped (11/19/14 0930)  . lactated ringers 10 mL/hr at 11/18/14 1237    Past Medical History  Diagnosis Date  . Hypertension   . Hypercholesteremia   . Gout     Past Surgical History  Procedure Laterality Date  . Nephrectomy    . Orif humerus fracture Left 11/18/2014    Procedure: OPEN REDUCTION INTERNAL FIXATION (ORIF) DISTAL HUMERUS FRACTURE;  Surgeon: Rozanna Box, MD;  Location: [facility];  Service: Orthopedics;  Laterality: Left;    Maylon Peppers RD, [place], [place] Pager [phone] After Hours Pager

## 2014-11-20 NOTE — Progress Notes (Signed)
Physical Therapy Treatment Patient Details Name: Edward Singleton MRN: [id number] DOB: [birth year] Today's Date: 11/20/2014    History of Present Illness pt sustained fall at home resulting in L distal humerus fx, L rib fxs, and L pelvic fxs.      PT Comments    Pt more alert this pm and participating with PT.  Pt has increased difficulty finding midline balance and pushes self to R side.  Pt needs firm cueing and visual fixation to A with maintaining balance.  Continue to feel pt will need CIR at D/C to maximize independence.  Will continue to follow.    Follow Up Recommendations  CIR     Equipment Recommendations  None recommended by PT    Recommendations for Other Services       Precautions / Restrictions Precautions Precautions: Fall Required Braces or Orthoses: Other Brace/Splint;Sling Other Brace/Splint: L UE in splint and sling.   Restrictions Weight Bearing Restrictions: Yes LUE Weight Bearing: Non weight bearing RLE Weight Bearing: Weight bearing as tolerated LLE Weight Bearing: Weight bearing as tolerated    Mobility  Bed Mobility Overal bed mobility: Needs Assistance;+2 for physical assistance Bed Mobility: Rolling;Sidelying to Sit;Sit to Sidelying Rolling: Min assist Sidelying to sit: Max assist;+2 for physical assistance     Sit to sidelying: Mod assist;+2 for physical assistance General bed mobility comments: pt utilizes bed rail to A with rolling to R side, but needs increased A with coming to sitting and returning to supine.    Transfers Overall transfer level: Needs assistance Equipment used: 2 person hand held assist Transfers: Sit to/from Stand Sit to Stand: Max assist;+2 physical assistance         General transfer comment: pt needs feet blocked and visual fixation to help pt maintain midline for balance.  pt keeps LEs with wide BOS and and stays somewhat flexed posture.    Ambulation/Gait                 Stairs             Wheelchair Mobility    Modified Rankin (Stroke Patients Only)       Balance Overall balance assessment: Needs assistance Sitting-balance support: Single extremity supported;Feet supported Sitting balance-Leahy Scale: Poor Sitting balance - Comments: pt tends to push to R side and uses R UE to pull himself to R side.  pt needs max cueing and visual fixation to midline balance.   Postural control: Right lateral lean Standing balance support: During functional activity Standing balance-Leahy Scale: Poor                      Cognition Arousal/Alertness: Awake/alert Behavior During Therapy: Flat affect Overall Cognitive Status: Impaired/Different from baseline Area of Impairment: Orientation;Attention;Memory;Following commands;Safety/judgement;Problem solving;Awareness Orientation Level: Disoriented to;Time;Situation (pt knew he was in a hospital, but unsure which one.  ) Current Attention Level: Sustained Memory: Decreased short-term memory;Decreased recall of precautions Following Commands: Follows one step commands with increased time;Follows multi-step commands inconsistently Safety/Judgement: Decreased awareness of safety;Decreased awareness of deficits Awareness: Intellectual Problem Solving: Slow processing;Decreased initiation;Difficulty sequencing;Requires verbal cues;Requires tactile cues General Comments: pt more alert than this am and conversant with PT.  pt needed frequent re-orienting to injuries, especially L UE.      Exercises      General Comments        Pertinent Vitals/Pain Pain Assessment: Faces Faces Pain Scale: Hurts even more Pain Location: Grimaces and calls out during mobility, but does not  rate when asked and has trouble identifying location.   Pain Descriptors / Indicators: Grimacing Pain Intervention(s): Premedicated before session;Monitored during session;Repositioned    Home Living                      Prior Function             PT Goals (current goals can now be found in the care plan section) Acute Rehab PT Goals Patient Stated Goal: pt not stating.   PT Goal Formulation: With patient Time For Goal Achievement: 12/02/14 Potential to Achieve Goals: Good Progress towards PT goals: Progressing toward goals    Frequency  Min 3X/week    PT Plan Current plan remains appropriate    Co-evaluation             End of Session Equipment Utilized During Treatment: Gait belt Activity Tolerance: Patient limited by pain Patient left: in bed;with call bell/phone within reach;with bed alarm set;with restraints reapplied     Time: 1331-1356 PT Time Calculation (min) (ACUTE ONLY): 25 min  Charges:  $Therapeutic Activity: 23-37 mins                    G CodesCatarina Hartshorn, [place] 11/20/2014, 3:03 PM

## 2014-11-20 NOTE — Progress Notes (Signed)
PT Cancellation Note  Patient Details Name: Edward Singleton MRN: [id number] DOB: 10/09/1951   Cancelled Treatment:    Reason Eval/Treat Not Completed: Fatigue/lethargy limiting ability to participate.  Pt lethargic and difficult to arouse.  Will f/u later in day to see if any more alert for PT.     [name], [place] Papas 11/20/2014, 8:58 AM

## 2014-11-20 NOTE — Progress Notes (Addendum)
Rehab admissions - I met with pt in follow up to rehab MD consult and he was minimally responsive to my interactions. His eyes were open and he was minimally verbal. He had pulled off his O2 and telemetry leads and I alerted his nurse.  I then called and spoke with pt's sister, Jill Bullis.  When I shared information about our rehab program and questioned what available family support there is, Jill shared that she and her other sister would not be able to be a part of his caregiving team. Jill could not speak for pt's other sister Brenda Pacey. Jill shared some baseline details of her brother and stated that he was working full time at [employer].  I have noted that pt has not been able to participate fully with therapies and will be monitoring his chart for latest therapy documentation. In addition, pt has [employer] insurance and we would need insurance approval for a possible inpatient rehab stay.  We will follow pt's case. Thanks.   , PT Rehabilitation Admissions Coordinator [phone]  

## 2014-11-20 NOTE — Progress Notes (Signed)
Orthopaedic Trauma Service Progress Note  Subjective  Somnolent PT in room   ROS As above   Objective   BP 149/93 mmHg  Pulse 63  Temp(Src) 97.4 F (36.3 C) (Axillary)  Resp 13  Ht 5\' 7"  (1.702 m)  Wt 74.844 kg (165 lb)  BMI 25.84 kg/m2  SpO2 95%  Intake/Output      02/10 0701 - 02/11 0700 02/11 0701 - 02/12 0700   P.O. 120    I.V. (mL/kg) 1487.7 (19.9) 65 (0.9)   Blood     IV Piggyback 50    Total Intake(mL/kg) 1657.7 (22.1) 65 (0.9)   Urine (mL/kg/hr) 2000 (1.1) 150 (1.1)   Blood     Total Output 2000 150   Net -342.3 -85          Labs  Results for DEUNTAE, HEFT (MRN [id number]) as of 11/20/2014 08:50  Ref. Range 11/20/2014 03:51  WBC Latest Range: 4.0-10.5 K/uL 18.2 (H)  RBC Latest Range: 4.22-5.81 MIL/uL 2.92 (L)  Hemoglobin Latest Range: 13.0-17.0 g/dL 10.2 (L)  HCT Latest Range: 39.0-52.0 % 30.1 (L)  MCV Latest Range: 78.0-100.0 fL 103.1 (H)  MCH Latest Range: 26.0-34.0 pg 34.9 (H)  MCHC Latest Range: 30.0-36.0 g/dL 33.9  RDW Latest Range: 11.5-15.5 % 19.4 (H)  Platelets Latest Range: 150-400 K/uL 229    Exam  Gen: somnolent  Ext:      Left Upper Extremity   Splint fitting well  + swelling to hand  Not participating in exam today   Ext warm   Brisk cap refill     Assessment and Plan   POD/HD#: 2   64 y/o male s/p fall with L distal humerus fracture  1. Fall  2. L distal humerus fracture s/p ORIF             NWB L upper extremity               reviewed notes from yesterday, pt was pretty agitated, will continue splint for now              Will not have any ROM restrictions once splint removed               Ice and elevate             PT/OT  3. Left LC1 pelvic ring fracture             Will be WBAT B                4. Anemia        stable    5. DVT/PE prophylaxis             SCD's             lovenox   7. Medical issues             Per IM  8. Dispo             Ortho issues addressed             Continue per TS      Jari Pigg, PA-C [facility] [phone] (O) 11/20/2014 8:48 AM

## 2014-11-20 NOTE — Evaluation (Signed)
Clinical/Bedside Swallow Evaluation Patient Details  Name: STEFAN MCFEELY MRN: [id number] Date of Birth: [birth year]  Today's Date: 11/20/2014 Time: SLP Start Time (ACUTE ONLY): [id number] SLP Stop Time (ACUTE ONLY): 1423 SLP Time Calculation (min) (ACUTE ONLY): 16 min  Past Medical History:  Past Medical History  Diagnosis Date  . Hypertension   . Hypercholesteremia   . Gout    Past Surgical History:  Past Surgical History  Procedure Laterality Date  . Nephrectomy    . Orif humerus fracture Left 11/18/2014    Procedure: OPEN REDUCTION INTERNAL FIXATION (ORIF) DISTAL HUMERUS FRACTURE;  Surgeon: Rozanna Box, MD;  Location: [facility];  Service: Orthopedics;  Laterality: Left;   HPI:  64 y.o. M brought to [facility] ED 2/8 after suffering a mechanical fall at home. Imaging in ED revealed multiple fractures. He was admitted by trauma and seen in consultation by ortho who took him to OR for ORIF of left humerus fx on 2/9. He returned to ICU on vent and due to increasing ativan requirements for ETOH withdrawal.  Extubated 2/10; has been somnolent, unable to participate in swallow evaluation.   Assessment / Plan / Recommendation Clinical Impression  64 presents with normal oropharyngeal swallow function - fatigues quickly, pain interferes with appetite, but biomechanics of swallow are WNL. RR increased to 40s intermittently during assessment; rate did not appear to interfere with airway protection.  Continue regular diet with thin liquids; no SLP f/u for swallowing.      Aspiration Risk  Mild    Diet Recommendation Regular;Thin liquid   Liquid Administration via: Cup;Straw Medication Administration: Whole meds with liquid Supervision: Patient able to self feed (assist with set-up) Postural Changes and/or Swallow Maneuvers: Seated upright 90 degrees    Other  Recommendations Oral Care Recommendations: Oral care BID   Follow Up Recommendations  None      Swallow Study Prior Functional Status      General Date of Onset: 11/17/14 HPI: 64 y.o. M brought to [facility] ED 2/8 after suffering a mechanical fall at home. Imaging in ED revealed multiple fractures. He was admitted by trauma and seen in consultation by ortho who took him to OR for ORIF of left humerus fx on 2/9. He returned to ICU on vent and due to increasing ativan requirements for ETOH withdrawal.  Extubated 2/10; has been somnolent, unable to participate in swallow evaluation. Type of Study: Bedside swallow evaluation Previous Swallow Assessment: none per records Diet Prior to this Study: Regular;Thin liquids Temperature Spikes Noted: No Respiratory Status: Room air (RR reaching 40s) History of Recent Intubation: Yes Date extubated: 11/19/14 Behavior/Cognition: Alert;Confused Oral Cavity - Dentition: Adequate natural dentition Self-Feeding Abilities: Able to feed self;Needs set up;Needs assist Patient Positioning: Upright in bed Baseline Vocal Quality: Clear Volitional Cough: Strong Volitional Swallow: Able to elicit    Oral/Motor/Sensory Function Overall Oral Motor/Sensory Function: Appears within functional limits for tasks assessed   Ice Chips Ice chips: Within functional limits   Thin Liquid Thin Liquid: Within functional limits Presentation: Cup;Straw    Nectar Thick Nectar Thick Liquid: Not tested   Honey Thick Honey Thick Liquid: Not tested   Puree Puree: Within functional limits Presentation: [place]. [place], [place] CCC/SLP Pager [phone]     Solid: Within functional limits Presentation: Self Fed       Juan Quam Laurice 11/20/2014,2:32 PM

## 2014-11-21 LAB — CBC WITH DIFFERENTIAL/PLATELET
BASOS ABS: 0 10*3/uL (ref 0.0–0.1)
Basophils Relative: 0 % (ref 0–1)
Eosinophils Absolute: 0 10*3/uL (ref 0.0–0.7)
Eosinophils Relative: 0 % (ref 0–5)
HCT: 27.8 % — ABNORMAL LOW (ref 39.0–52.0)
Hemoglobin: 9.4 g/dL — ABNORMAL LOW (ref 13.0–17.0)
LYMPHS PCT: 7 % — AB (ref 12–46)
Lymphs Abs: 1.1 10*3/uL (ref 0.7–4.0)
MCH: 34.2 pg — ABNORMAL HIGH (ref 26.0–34.0)
MCHC: 33.8 g/dL (ref 30.0–36.0)
MCV: 101.1 fL — AB (ref 78.0–100.0)
Monocytes Absolute: 2.3 10*3/uL — ABNORMAL HIGH (ref 0.1–1.0)
Monocytes Relative: 14 % — ABNORMAL HIGH (ref 3–12)
Neutro Abs: 13.4 10*3/uL — ABNORMAL HIGH (ref 1.7–7.7)
Neutrophils Relative %: 79 % — ABNORMAL HIGH (ref 43–77)
PLATELETS: 258 10*3/uL (ref 150–400)
RBC: 2.75 MIL/uL — ABNORMAL LOW (ref 4.22–5.81)
RDW: 18.6 % — AB (ref 11.5–15.5)
WBC: 16.9 10*3/uL — AB (ref 4.0–10.5)

## 2014-11-21 LAB — VITAMIN D 25 HYDROXY (VIT D DEFICIENCY, FRACTURES): Vit D, 25-Hydroxy: 10.3 ng/mL — ABNORMAL LOW (ref 30.0–100.0)

## 2014-11-21 MED ORDER — DEXMEDETOMIDINE HCL IN NACL 200 MCG/50ML IV SOLN
0.4000 ug/kg/h | INTRAVENOUS | Status: DC
  Administered 2014-11-21: 0.9 ug/kg/h via INTRAVENOUS
  Administered 2014-11-21: 0.4 ug/kg/h via INTRAVENOUS
  Administered 2014-11-22 (×3): 1.2 ug/kg/h via INTRAVENOUS
  Filled 2014-11-21: qty 50
  Filled 2014-11-21: qty 100
  Filled 2014-11-21 (×2): qty 50

## 2014-11-21 MED ORDER — LORAZEPAM 2 MG/ML IJ SOLN
1.0000 mg | Freq: Four times a day (QID) | INTRAMUSCULAR | Status: DC | PRN
  Administered 2014-11-22: 1 mg via INTRAVENOUS
  Filled 2014-11-21: qty 1

## 2014-11-21 MED ORDER — HALOPERIDOL LACTATE 5 MG/ML IJ SOLN
2.5000 mg | Freq: Once | INTRAMUSCULAR | Status: AC
  Administered 2014-11-21: 2.5 mg via INTRAMUSCULAR
  Filled 2014-11-21: qty 1

## 2014-11-21 MED ORDER — IPRATROPIUM-ALBUTEROL 0.5-2.5 (3) MG/3ML IN SOLN
RESPIRATORY_TRACT | Status: AC
  Filled 2014-11-21: qty 3

## 2014-11-21 MED ORDER — LORAZEPAM 1 MG PO TABS
1.0000 mg | ORAL_TABLET | Freq: Four times a day (QID) | ORAL | Status: DC | PRN
  Administered 2014-11-21 (×2): 1 mg via ORAL
  Filled 2014-11-21 (×2): qty 1

## 2014-11-21 NOTE — Progress Notes (Signed)
Patient unable to spontaneously void. In and out cath x 2 last night. Last time I/O cath was at 0530.  Patient states he sometimes has trouble voiding at home as well since he only has 1 kidney. Patient remained agitated, confused, anxious throughout night attempting to climb OOB with restraints on.  Given ativan 3mg  Q 2 hours and haldol once with no improvement. Will continue to monitor.

## 2014-11-21 NOTE — Evaluation (Signed)
Occupational Therapy Evaluation Patient Details Name: Edward Singleton MRN: [id number] DOB: [birth year] Today's Date: 11/21/2014    History of Present Illness pt sustained fall at home resulting in L distal humerus fx, L rib fxs, and L pelvic fxs.     Clinical Impression   This 64 yo male admitted and underwent above presents to acute OT with decreased cognition, increased confusion, NWB'ing LUE, decreased mobility, decreased balance, decreased safety awareness all affecting his ability to care for himself at home at an Independent level as he was pta including working (a driving job). He will benefit from acute OT with follow up OT on CIR to get to a S level or better.    Follow Up Recommendations  CIR    Equipment Recommendations   (TBD next venue)       Precautions / Restrictions Precautions Precautions: Fall Required Braces or Orthoses: Other Brace/Splint;Sling Other Brace/Splint: L UE in splint and sling.   Restrictions Weight Bearing Restrictions: Yes RUE Weight Bearing: Weight bearing as tolerated LUE Weight Bearing: Non weight bearing RLE Weight Bearing: Weight bearing as tolerated LLE Weight Bearing: Weight bearing as tolerated      Mobility Bed Mobility Overal bed mobility: +2 for physical assistance;Needs Assistance Bed Mobility: Supine to Sit     Supine to sit: Mod assist;+2 for physical assistance        Transfers Overall transfer level: Needs assistance Equipment used: 2 person hand held assist Transfers: Sit to/from Stand;Stand Pivot Transfers Sit to Stand: +2 physical assistance;Mod assist Stand pivot transfers: +2 physical assistance;Mod assist            Balance Overall balance assessment: Needs assistance Sitting-balance support: Single extremity supported;Feet supported Sitting balance-Leahy Scale: Poor Sitting balance - Comments: pt leaning forward and to right Postural control: Right lateral lean Standing balance support: Single extremity  supported Standing balance-Leahy Scale: Poor                              ADL Overall ADL's : Needs assistance/impaired Eating/Feeding: Minimal assistance;Sitting Eating/Feeding Details (indicate cue type and reason): with cues to stay on task Grooming: Wash/dry face;Sitting Grooming Details (indicate cue type and reason): with cues to stay on task and throughness Upper Body Bathing: Moderate assistance Upper Body Bathing Details (indicate cue type and reason): with cues to stay on task Lower Body Bathing: Total assistance (with mod A +2 sit<>stand and cues to stay on task)   Upper Body Dressing : Total assistance ( cues to stay on task)   Lower Body Dressing: Total assistance (with mod A +2 sit<>stand and cues to stay on task)   Toilet Transfer: Moderate assistance;+2 for physical assistance;Stand-pivot (bed>recliner going to pt's left and cues to stay on task)   Toileting- Clothing Manipulation and Hygiene: Total assistance (with mod A +2 sit<>stand and cues to stay on task)                         Pertinent Vitals/Pain Pain Assessment: Faces Pain Score: 4  Pain Location: makes noises that signify pain with when he moves Pain Descriptors / Indicators: Grimacing Pain Intervention(s): Monitored during session;Repositioned     Hand Dominance Right   Extremity/Trunk Assessment Upper Extremity Assessment Upper Extremity Assessment: LUE deficits/detail LUE Deficits / Details: humeral fx--cast and sling; increased edema in hand LUE Coordination: decreased gross motor;decreased fine motor  Communication Communication Communication: No difficulties   Cognition Arousal/Alertness: Awake/alert Behavior During Therapy: Flat affect;Impulsive (wanting to get OOB, several times I had to ask him to stop and wait until we were ready to help him) Overall Cognitive Status: Impaired/Different from baseline Area of Impairment: Orientation;Attention;Following  commands;Safety/judgement;Problem solving Orientation Level:  (initally disoriented to who is baby sister was (beginning of session), but then knew at end of session. Could not recall his deceased brother's names, but got all of his living sisters' names correct.) Current Attention Level: Sustained   Following Commands: Follows one step commands inconsistently Safety/Judgement:  (keeps trying to use his LUE even though in cast and sling)   Problem Solving: Slow processing;Decreased initiation;Difficulty sequencing;Requires verbal cues;Requires tactile cues                Home Living Family/patient expects to be discharged to:: Inpatient rehab Living Arrangements: Other relatives;Non-relatives/Friends                                      Prior Functioning/Environment Level of Independence: Independent        Comments: pt works and drives.    OT Diagnosis: Generalized weakness;Acute pain;Cognitive deficits;Altered mental status   OT Problem List: Decreased strength;Decreased range of motion;Impaired balance (sitting and/or standing);Decreased activity tolerance;Pain;Decreased safety awareness;Decreased cognition;Impaired UE functional use;Decreased coordination;Decreased knowledge of precautions;Decreased knowledge of use of DME or AE;Increased edema   OT Treatment/Interventions: Self-care/ADL training;Patient/family education;Balance training;DME and/or AE instruction;Cognitive remediation/compensation;Therapeutic activities    OT Goals(Current goals can be found in the care plan section) Acute Rehab OT Goals Patient Stated Goal: wanting to get OOB and go to work OT Goal Formulation: With patient/family Time For Goal Achievement: 12/05/14 Potential to Achieve Goals: Good  OT Frequency: Min 3X/week              End of Session Equipment Utilized During Treatment: Gait belt (sling for LUE) Nurse Communication: Mobility status  Activity Tolerance:  (limited  by decreased safety awareness) Patient left: in chair;with call bell/phone within reach;with nursing/sitter in room;with family/visitor present   Time: [id number] OT Time Calculation (min): 22 min Charges:  OT General Charges $OT Visit: 1 Procedure OT Evaluation $Initial OT Evaluation Tier I: 1 Procedure  Almon Register [id number] 11/21/2014, 2:41 PM

## 2014-11-21 NOTE — Progress Notes (Signed)
Rehab admissions - I am following pt's case and will see how pt progresses with therapy over the weekend. I noted that pt has had confusion and agitation through the night. Per trauma MD, may transfer to SDU soon.  Please call me with any questions. Thanks.  Nanetta Batty, PT Rehabilitation Admissions Coordinator [phone]

## 2014-11-21 NOTE — Progress Notes (Signed)
UR completed.  Anali Cabanilla, RN BSN MHA CCM Trauma/Neuro ICU Case Manager [phone]  

## 2014-11-21 NOTE — Progress Notes (Signed)
Orthopaedic Trauma Service Progress Note  Subjective  Patient has been agitated  Calm currently   Review of Systems  Constitutional: Negative for fever and chills.  Cardiovascular: Negative for chest pain and palpitations.  Gastrointestinal: Negative for nausea and vomiting.  Musculoskeletal:       L elbow pain   Neurological: Negative for tingling and sensory change.     Objective   BP 130/69 mmHg  Pulse 111  Temp(Src) 98.7 F (37.1 C) (Oral)  Resp 13  Ht 5\' 7"  (1.702 m)  Wt 74.844 kg (165 lb)  BMI 25.84 kg/m2  SpO2 90%  Intake/Output      02/11 0701 - 02/12 0700 02/12 0701 - 02/13 0700   P.O. 420 240   I.V. (mL/kg) 1215 (16.2) 100 (1.3)   IV Piggyback     Total Intake(mL/kg) 1635 (21.8) 340 (4.5)   Urine (mL/kg/hr) 1150 (0.6)    Total Output 1150     Net +485 +340           Exam  Gen: resting comfortably, NAD  Ext:       Left Upper Extremity   Splint fitting well  Motor and sensory functions intact  Ext warm   Soft restraints    Assessment and Plan   POD/HD#: 74   64 y/o male s/p fall with L distal humerus fracture  1. Fall  2. L distal humerus fracture s/p ORIF             NWB L upper extremity               reviewed notes from yesterday, pt was pretty agitated, will continue splint for now. Will likely remove Monday or Tuesday of next week              Will not have any ROM restrictions once splint removed               Ice and elevate             PT/OT  3. Left LC1 pelvic ring fracture            WBAT B                 4. Anemia        stable    5. DVT/PE prophylaxis             SCD's             lovenox   7. Medical issues             Per IM  8. Dispo             Ortho issues stable             Continue per TS      Jari Pigg, PA-C [facility] [phone] (O) 11/21/2014 3:20 PM

## 2014-11-21 NOTE — Progress Notes (Signed)
Trauma Service Note  Subjective: Patient a little bit confused this AM.  Has gotten a fair amount of sedation per CIWA withdrawal rpotocol  Objective: Vital signs in last 24 hours: Temp:  [97.8 F (36.6 C)-98.3 F (36.8 C)] 98.3 F (36.8 C) (02/12 0400) Pulse Rate:  [61-140] 138 (02/12 0700) Resp:  [11-30] 28 (02/12 0700) BP: (61-153)/(38-101) 150/80 mmHg (02/12 0700) SpO2:  [85 %-100 %] 95 % (02/12 0700) FiO2 (%):  [45 %] 45 % (02/12 0504)    Intake/Output from previous day: 02/11 0701 - 02/12 0700 In: 1635 [P.O.:420; I.V.:1215] Out: 1150 [Urine:1150] Intake/Output this shift:    General: No acute distress  Lungs: Clear  Abd: Excellent bowel sounds.  Non-tender.  Extremities: Intact and no changes  Neuro: Confused and intermittently agitated.  Lab Results: CBC   Recent Labs  11/20/14 0351 11/21/14 0249  WBC 18.2* 16.9*  HGB 10.2* 9.4*  HCT 30.1* 27.8*  PLT 229 258   BMET  Recent Labs  11/20/14 0351 11/20/14 1133  NA 138 140  K 3.6 3.9  CL 109 110  CO2 22 23  GLUCOSE 139* 132*  BUN 8 7  CREATININE 1.32 1.35  CALCIUM 7.5* 7.9*   PT/INR No results for input(s): LABPROT, INR in the last 72 hours. ABG No results for input(s): PHART, HCO3 in the last 72 hours.  Invalid input(s): PCO2, PO2  Studies/Results: No results found.  Anti-infectives: Anti-infectives    Start     Dose/Rate Route Frequency Ordered Stop   11/18/14 2000  ceFAZolin (ANCEF) IVPB 2 g/50 mL premix     2 g 100 mL/hr over 30 Minutes Intravenous 3 times per day 11/18/14 1738 11/19/14 1506   11/18/14 0630  ceFAZolin (ANCEF) IVPB 1 g/50 mL premix  Status:  Discontinued     1 g 100 mL/hr over 30 Minutes Intravenous 3 times per day 11/18/14 0626 11/18/14 1738      Assessment/Plan: s/p Procedure(s): OPEN REDUCTION INTERNAL FIXATION (ORIF) DISTAL HUMERUS FRACTURE Transfer to SDU  Change to oral CIWA protocol Urinary retention--I & O cath.  Add Flowmax and urecholine  LOS: 4  days   Kathryne Eriksson. Dahlia Bailiff, MD, FACS [phone] Trauma Surgeon 11/21/2014

## 2014-11-22 MED ORDER — LORAZEPAM 1 MG PO TABS
1.0000 mg | ORAL_TABLET | ORAL | Status: DC | PRN
  Administered 2014-11-22 – 2014-11-25 (×3): 1 mg via ORAL
  Filled 2014-11-22 (×3): qty 1

## 2014-11-22 MED ORDER — LORAZEPAM 2 MG/ML IJ SOLN
1.0000 mg | INTRAMUSCULAR | Status: DC | PRN
  Administered 2014-11-24: 1 mg via INTRAVENOUS
  Filled 2014-11-22: qty 1

## 2014-11-22 MED ORDER — FOLIC ACID 1 MG PO TABS
1.0000 mg | ORAL_TABLET | Freq: Every day | ORAL | Status: DC
  Administered 2014-11-22 – 2014-11-25 (×4): 1 mg via ORAL
  Filled 2014-11-22 (×4): qty 1

## 2014-11-22 NOTE — Progress Notes (Signed)
Trauma Service Note  Subjective: Patient is somnolent, snoring, but arousable.  Has restraints in place with mittens.  Still somewhat disoriented.  Objective: Vital signs in last 24 hours: Temp:  [98.7 F (37.1 C)-100.5 F (38.1 C)] 99.5 F (37.5 C) (02/13 0000) Pulse Rate:  [75-131] 75 (02/13 0600) Resp:  [10-36] 23 (02/13 0600) BP: (96-185)/(51-131) 104/57 mmHg (02/13 0600) SpO2:  [47 %-100 %] 92 % (02/13 0600)    Intake/Output from previous day: 02/12 0701 - 02/13 0700 In: 1607.9 [P.O.:957; I.V.:650.9] Out: 1775 [Urine:1775] Intake/Output this shift:    General: No acute distress, but snoring  Lungs: Oxygen saturations are good.  No supplemental oxygen.  Good breath sounds.  Abd: soft, benign, good bowel sounds  Extremities: No changes  Neuro: Intact  Lab Results: CBC   Recent Labs  11/20/14 0351 11/21/14 0249  WBC 18.2* 16.9*  HGB 10.2* 9.4*  HCT 30.1* 27.8*  PLT 229 258   BMET  Recent Labs  11/20/14 0351 11/20/14 1133  NA 138 140  K 3.6 3.9  CL 109 110  CO2 22 23  GLUCOSE 139* 132*  BUN 8 7  CREATININE 1.32 1.35  CALCIUM 7.5* 7.9*   PT/INR No results for input(s): LABPROT, INR in the last 72 hours. ABG No results for input(s): PHART, HCO3 in the last 72 hours.  Invalid input(s): PCO2, PO2  Studies/Results: Dg Pelvis Comp Min 3v  11/21/2014   CLINICAL DATA:  Pelvic ring fracture.  EXAM: JUDET PELVIS - 3+ VIEW  COMPARISON:  November 17, 2014.  FINDINGS: Mildly displaced medial left acetabular fracture is again noted and unchanged. Mildly displaced fracture involving the left inferior pubic ramus is also noted and unchanged. Minimally displaced left sacral wing fracture is noted. Sacroiliac joints and right hip joint appear normal.  IMPRESSION: Stable fractures are seen involving the medial left acetabulum, left inferior pubic ramus and left sacral wing.   Electronically Signed   By: Marijo Conception, M.D.   On: 11/21/2014 11:53     Anti-infectives: Anti-infectives    Start     Dose/Rate Route Frequency Ordered Stop   11/18/14 2000  ceFAZolin (ANCEF) IVPB 2 g/50 mL premix     2 g 100 mL/hr over 30 Minutes Intravenous 3 times per day 11/18/14 1738 11/19/14 1506   11/18/14 0630  ceFAZolin (ANCEF) IVPB 1 g/50 mL premix  Status:  Discontinued     1 g 100 mL/hr over 30 Minutes Intravenous 3 times per day 11/18/14 0626 11/18/14 1738      Assessment/Plan: s/p Procedure(s): OPEN REDUCTION INTERNAL FIXATION (ORIF) DISTAL HUMERUS FRACTURE Changes sedation so that he can remain awake without being too agitated.  LOS: 5 days   Kathryne Eriksson. Dahlia Bailiff, MD, FACS [phone] Trauma Surgeon 11/22/2014

## 2014-11-23 LAB — URINALYSIS, ROUTINE W REFLEX MICROSCOPIC
Bilirubin Urine: NEGATIVE
GLUCOSE, UA: NEGATIVE mg/dL
Hgb urine dipstick: NEGATIVE
Ketones, ur: NEGATIVE mg/dL
Leukocytes, UA: NEGATIVE
NITRITE: NEGATIVE
PROTEIN: NEGATIVE mg/dL
Specific Gravity, Urine: 1.013 (ref 1.005–1.030)
UROBILINOGEN UA: 0.2 mg/dL (ref 0.0–1.0)
pH: 5.5 (ref 5.0–8.0)

## 2014-11-23 LAB — BASIC METABOLIC PANEL
Anion gap: 8 (ref 5–15)
BUN: 14 mg/dL (ref 6–23)
CO2: 27 mmol/L (ref 19–32)
Calcium: 8.1 mg/dL — ABNORMAL LOW (ref 8.4–10.5)
Chloride: 101 mmol/L (ref 96–112)
Creatinine, Ser: 1.58 mg/dL — ABNORMAL HIGH (ref 0.50–1.35)
GFR, EST AFRICAN AMERICAN: 52 mL/min — AB (ref >90)
GFR, EST NON AFRICAN AMERICAN: 45 mL/min — AB (ref >90)
GLUCOSE: 95 mg/dL (ref 70–99)
POTASSIUM: 4 mmol/L (ref 3.5–5.1)
SODIUM: 136 mmol/L (ref 135–145)

## 2014-11-23 LAB — CBC WITH DIFFERENTIAL/PLATELET
Basophils Absolute: 0.1 10*3/uL (ref 0.0–0.1)
Basophils Relative: 0 % (ref 0–1)
EOS ABS: 0.3 10*3/uL (ref 0.0–0.7)
EOS PCT: 2 % (ref 0–5)
HCT: 30.2 % — ABNORMAL LOW (ref 39.0–52.0)
Hemoglobin: 10 g/dL — ABNORMAL LOW (ref 13.0–17.0)
Lymphocytes Relative: 12 % (ref 12–46)
Lymphs Abs: 2.4 10*3/uL (ref 0.7–4.0)
MCH: 33.9 pg (ref 26.0–34.0)
MCHC: 33.1 g/dL (ref 30.0–36.0)
MCV: 102.4 fL — AB (ref 78.0–100.0)
MONOS PCT: 17 % — AB (ref 3–12)
Monocytes Absolute: 3.4 10*3/uL — ABNORMAL HIGH (ref 0.1–1.0)
NEUTROS ABS: 13.5 10*3/uL — AB (ref 1.7–7.7)
NEUTROS PCT: 69 % (ref 43–77)
PLATELETS: 366 10*3/uL (ref 150–400)
RBC: 2.95 MIL/uL — ABNORMAL LOW (ref 4.22–5.81)
RDW: 17.7 % — ABNORMAL HIGH (ref 11.5–15.5)
WBC: 19.7 10*3/uL — ABNORMAL HIGH (ref 4.0–10.5)

## 2014-11-23 LAB — TROPONIN I: Troponin I: <0.03 ng/mL (ref <0.031)

## 2014-11-23 MED ORDER — POLYETHYLENE GLYCOL 3350 17 G PO PACK
17.0000 g | PACK | Freq: Every day | ORAL | Status: DC
  Administered 2014-11-23 – 2014-11-25 (×3): 17 g via ORAL
  Filled 2014-11-23 (×3): qty 1

## 2014-11-23 NOTE — Progress Notes (Signed)
Trauma Service Note  Subjective: Patient not as agitated and conversant this AM.  No acute distress.  Only oriented to person, but not place or time  Objective: Vital signs in last 24 hours: Temp:  [97.4 F (36.3 C)-98.6 F (37 C)] 98.3 F (36.8 C) (02/14 0400) Pulse Rate:  [65-113] 93 (02/14 0700) Resp:  [9-23] 11 (02/14 0700) BP: (87-171)/(49-145) 118/70 mmHg (02/14 0700) SpO2:  [90 %-100 %] 90 % (02/14 0400)    Intake/Output from previous day: 02/13 0701 - 02/14 0700 In: 740 [P.O.:500; I.V.:240] Out: 250 [Urine:250] Intake/Output this shift:    General: No acute distress and not as agitated.  Lungs: Wheezing on the left side.  Abd: Aoft, good bowel sounds.  Extremities: No clinical signs or symptoms of DVT.  Left had is swollen but warm and viable.  Splint on the left arm may be a bit tight.  Will ask Ortho to see.  Neuro: Disoriented, but not a danger to himself  Lab Results: CBC   Recent Labs  11/21/14 0249 11/23/14 0240  WBC 16.9* 19.7*  HGB 9.4* 10.0*  HCT 27.8* 30.2*  PLT 258 366   BMET  Recent Labs  11/20/14 1133 11/23/14 0240  NA 140 136  K 3.9 4.0  CL 110 101  CO2 23 27  GLUCOSE 132* 95  BUN 7 14  CREATININE 1.35 1.58*  CALCIUM 7.9* 8.1*   PT/INR No results for input(s): LABPROT, INR in the last 72 hours. ABG No results for input(s): PHART, HCO3 in the last 72 hours.  Invalid input(s): PCO2, PO2  Studies/Results: No results found.  Anti-infectives: Anti-infectives    Start     Dose/Rate Route Frequency Ordered Stop   11/18/14 2000  ceFAZolin (ANCEF) IVPB 2 g/50 mL premix     2 g 100 mL/hr over 30 Minutes Intravenous 3 times per day 11/18/14 1738 11/19/14 1506   11/18/14 0630  ceFAZolin (ANCEF) IVPB 1 g/50 mL premix  Status:  Discontinued     1 g 100 mL/hr over 30 Minutes Intravenous 3 times per day 11/18/14 0626 11/18/14 1738      Assessment/Plan: s/p Procedure(s): OPEN REDUCTION INTERNAL FIXATION (ORIF) DISTAL HUMERUS  FRACTURE Transfer to SDU  LOS: 6 days   Kathryne Eriksson. Dahlia Bailiff, MD, FACS [phone] Trauma Surgeon 11/23/2014

## 2014-11-23 NOTE — Progress Notes (Signed)
MD paged regarding 4-5 beat runs of V tach. Orders received.

## 2014-11-24 LAB — CBC WITH DIFFERENTIAL/PLATELET
Basophils Absolute: 0 10*3/uL (ref 0.0–0.1)
Basophils Relative: 0 % (ref 0–1)
Eosinophils Absolute: 0.3 10*3/uL (ref 0.0–0.7)
Eosinophils Relative: 2 % (ref 0–5)
HCT: 28.3 % — ABNORMAL LOW (ref 39.0–52.0)
HEMOGLOBIN: 9.5 g/dL — AB (ref 13.0–17.0)
LYMPHS ABS: 3.1 10*3/uL (ref 0.7–4.0)
Lymphocytes Relative: 19 % (ref 12–46)
MCH: 35.1 pg — AB (ref 26.0–34.0)
MCHC: 33.6 g/dL (ref 30.0–36.0)
MCV: 104.4 fL — ABNORMAL HIGH (ref 78.0–100.0)
Monocytes Absolute: 2.8 10*3/uL — ABNORMAL HIGH (ref 0.1–1.0)
Monocytes Relative: 17 % — ABNORMAL HIGH (ref 3–12)
Neutro Abs: 10.1 10*3/uL — ABNORMAL HIGH (ref 1.7–7.7)
Neutrophils Relative %: 62 % (ref 43–77)
Platelets: 389 10*3/uL (ref 150–400)
RBC: 2.71 MIL/uL — ABNORMAL LOW (ref 4.22–5.81)
RDW: 17.9 % — ABNORMAL HIGH (ref 11.5–15.5)
WBC: 16.3 10*3/uL — ABNORMAL HIGH (ref 4.0–10.5)

## 2014-11-24 LAB — TROPONIN I
Troponin I: <0.03 ng/mL (ref <0.031)
Troponin I: <0.03 ng/mL (ref <0.031)

## 2014-11-24 LAB — URINE CULTURE
CULTURE: NO GROWTH
Colony Count: NO GROWTH

## 2014-11-24 LAB — CREATININE, SERUM
Creatinine, Ser: 1.31 mg/dL (ref 0.50–1.35)
GFR calc Af Amer: 66 mL/min — ABNORMAL LOW (ref >90)
GFR calc non Af Amer: 57 mL/min — ABNORMAL LOW (ref >90)

## 2014-11-24 MED ORDER — IPRATROPIUM-ALBUTEROL 0.5-2.5 (3) MG/3ML IN SOLN: 3.0000 mL | RESPIRATORY_TRACT | Status: DC | PRN

## 2014-11-24 NOTE — Progress Notes (Signed)
Orthopaedic Trauma Service Progress Note  Subjective  Doing ok C/o swelling in L arm   Review of Systems  Musculoskeletal:       +swelling L arm   Neurological: Negative for tingling and sensory change.     Objective   BP 143/78 mmHg  Pulse 96  Temp(Src) 98 F (36.7 C) (Oral)  Resp 12  Ht 5\' 7"  (1.702 m)  Wt 74.844 kg (165 lb)  BMI 25.84 kg/m2  SpO2 97%  Intake/Output      02/14 0701 - 02/15 0700 02/15 0701 - 02/16 0700   P.O. 1050    I.V. (mL/kg) 480 (6.4)    Total Intake(mL/kg) 1530 (20.4)    Urine (mL/kg/hr) 400 (0.2) 225 (1.8)   Total Output 400 225   Net +1130 -225          Labs  Results for Edward, Singleton (MRN [id number]) as of 11/24/2014 08:41  Ref. Range 11/24/2014 01:58  WBC Latest Range: 4.0-10.5 K/uL 16.3 (H)  RBC Latest Range: 4.22-5.81 MIL/uL 2.71 (L)  Hemoglobin Latest Range: 13.0-17.0 g/dL 9.5 (L)  HCT Latest Range: 39.0-52.0 % 28.3 (L)  MCV Latest Range: 78.0-100.0 fL 104.4 (H)  MCH Latest Range: 26.0-34.0 pg 35.1 (H)  MCHC Latest Range: 30.0-36.0 g/dL 33.6  RDW Latest Range: 11.5-15.5 % 17.9 (H)  Platelets Latest Range: 150-400 K/uL 389  Neutrophils Relative % Latest Range: 43-77 % 62  Lymphocytes Relative Latest Range: 12-46 % 19  Monocytes Relative Latest Range: 3-12 % 17 (H)  Eosinophils Relative Latest Range: 0-5 % 2  Basophils Relative Latest Range: 0-1 % 0  NEUT# Latest Range: 1.7-7.7 K/uL 10.1 (H)  Lymphocytes Absolute Latest Range: 0.7-4.0 K/uL 3.1  Monocytes Absolute Latest Range: 0.1-1.0 K/uL 2.8 (H)  Eosinophils Absolute Latest Range: 0.0-0.7 K/uL 0.3  Basophils Absolute Latest Range: 0.0-0.1 K/uL 0.0    Exam  Gen: appears comfortable in bed, NAD  Ext:       Left Upper Extremity   Swelling stable  Ext warm  + Radial pulse  Skin looks stable, no pressure sores noted  R/U/M motor and sensory functions intact  AIN/PIN motor intact    Assessment and Plan   POD/HD#: 90   64 y/o male s/p fall with L distal humerus  fracture  1. Fall  2. L distal humerus fracture s/p ORIF             NWB L upper extremity               reviewed notes from yesterday, pt was pretty agitated, will continue splint for now. D/c splint tomorrow              Will not have any ROM restrictions once splint removed               Ice and elevate             PT/OT  3. Left LC1 pelvic ring fracture            WBAT B                 4. Anemia        stable    5. DVT/PE prophylaxis             SCD's             lovenox   7. Medical issues             Per IM  8.  Dispo             Ortho issues stable             Continue per TS      Jari Pigg, PA-C [facility] [phone] (O) 11/24/2014 8:40 AM

## 2014-11-24 NOTE — Progress Notes (Signed)
Occupational Therapy Treatment Patient Details Name: Edward Singleton MRN: [id number] DOB: 13-Jun-1951 Today's Date: 11/24/2014    History of present illness pt sustained fall at home resulting in L distal humerus fx, L rib fxs, and L pelvic fxs.     OT comments  This 64 yo male admitted and underwent above presents to acute OT today making progress with following commands, sit<>stands, and taking steps. He will continue to benefit from acute OT with follow up on CIR.  Follow Up Recommendations  CIR    Equipment Recommendations   (TBD next venue)       Precautions / Restrictions Precautions Precautions: Fall Required Braces or Orthoses: Other Brace/Splint;Sling Other Brace/Splint: LUE cast Restrictions Weight Bearing Restrictions: Yes RUE Weight Bearing: Weight bearing as tolerated LUE Weight Bearing: Non weight bearing RLE Weight Bearing: Weight bearing as tolerated LLE Weight Bearing: Weight bearing as tolerated       Mobility Bed Mobility Overal bed mobility: Needs Assistance;+2 for physical assistance Bed Mobility: Sit to Supine    Sit to supine: Mod assist;+2 for physical assistance    Transfers Overall transfer level: Needs assistance Equipment used: 2 person hand held assist Transfers: Sit to/from [employer] Transfers Sit to Stand: Mod assist Stand pivot transfers: Mod assist;+2 physical assistance       General transfer comment: Pt also took 5 steps forward and 2 steps sideways with +2 Mod A (LLE "gives" when he steps with RLE due to pain)        ADL Overall ADL's : Needs assistance/impaired                         Toilet Transfer: Moderate assistance;+2 for physical assistance;Stand-pivot   Toileting- Clothing Manipulation and Hygiene: Total assistance (with Mod A +1 sit<>stand)                          Cognition   Behavior During Therapy: Impulsive Overall Cognitive Status: Impaired/Different from baseline Area of  Impairment: Orientation Orientation Level:  (Nov 26, 1995; did not know name of place) Current Attention Level: Sustained Memory: Decreased recall of precautions  Following Commands: Follows one step commands consistently Safety/Judgement: Decreased awareness of deficits (keeps trying to WB through LUE) Awareness: Intellectual Problem Solving: Difficulty sequencing;Requires verbal cues;Requires tactile cues General Comments: pt conversant and participating well today.  pt with poor recall of maintaining NWBing on L UE despite multiple cues.                   Pertinent Vitals/ Pain       Pain Assessment: 0-10 Pain Score: 7  Faces Pain Scale: Hurts even more Pain Location: LLE with WB'ing through it while advancing his RLE Pain Descriptors / Indicators: Grimacing;Sharp Pain Intervention(s): Monitored during session;Repositioned         Frequency Min 3X/week     Progress Toward Goals  OT Goals(current goals can now be found in the care plan section)  Progress towards OT goals: Progressing toward goals  Acute Rehab OT Goals Patient Stated Goal: wanting to get OOB and go to work  Plan Discharge plan remains appropriate       End of Session Equipment Utilized During Treatment: Gait belt   Activity Tolerance Patient limited by pain   Patient Left in bed;with call bell/phone within reach;with bed alarm set   Nurse Communication  (Asked her to order an x-large immobilizer sling)  Time: [id number] OT Time Calculation (min): 21 min  Charges: OT General Charges $OT Visit: 1 Procedure OT Treatments $Self Care/Home Management : 8-22 mins  Almon Register [id number] 11/24/2014, 12:53 PM

## 2014-11-24 NOTE — Progress Notes (Signed)
Trauma Service Note  Subjective: Patietn apparently had separate runs of ventricular tachycardia, which to me looks like possible runs of SVT with wide complex conduction.  12-lead okay.  Objective: Vital signs in last 24 hours: Temp:  [98 F (36.7 C)-99.3 F (37.4 C)] 98 F (36.7 C) (02/15 0743) Pulse Rate:  [87-103] 96 (02/15 0800) Resp:  [11-21] 12 (02/15 0700) BP: (130-149)/(64-85) 143/78 mmHg (02/15 0800) SpO2:  [90 %-98 %] 97 % (02/15 0749)    Intake/Output from previous day: 02/14 0701 - 02/15 0700 In: 1530 [P.O.:1050; I.V.:480] Out: 400 [Urine:400] Intake/Output this shift: Total I/O In: -  Out: 225 [Urine:225]  General: Seems a bit depressed, no acute distress.  Lungs: Clear  Abd: Benign  Extremities: Left arm still swollen and dressing has not been changed over the weekend.  Neuro: Intact, still a bit disoriented.  Lab Results: CBC   Recent Labs  11/23/14 0240 11/24/14 0158  WBC 19.7* 16.3*  HGB 10.0* 9.5*  HCT 30.2* 28.3*  PLT 366 389   BMET  Recent Labs  11/23/14 0240 11/24/14 0158  NA 136  --   K 4.0  --   CL 101  --   CO2 27  --   GLUCOSE 95  --   BUN 14  --   CREATININE 1.58* 1.31  CALCIUM 8.1*  --    PT/INR No results for input(s): LABPROT, INR in the last 72 hours. ABG No results for input(s): PHART, HCO3 in the last 72 hours.  Invalid input(s): PCO2, PO2  Studies/Results: No results found.  Anti-infectives: Anti-infectives    Start     Dose/Rate Route Frequency Ordered Stop   11/18/14 2000  ceFAZolin (ANCEF) IVPB 2 g/50 mL premix     2 g 100 mL/hr over 30 Minutes Intravenous 3 times per day 11/18/14 1738 11/19/14 1506   11/18/14 0630  ceFAZolin (ANCEF) IVPB 1 g/50 mL premix  Status:  Discontinued     1 g 100 mL/hr over 30 Minutes Intravenous 3 times per day 11/18/14 0626 11/18/14 1738      Assessment/Plan: s/p Procedure(s): OPEN REDUCTION INTERNAL FIXATION (ORIF) DISTAL HUMERUS FRACTURE Transfer to floor with  telemetry.  No acute intervention for arrhythmia at this time. If it should recur, will get cardiology consultation  LOS: 7 days   Kathryne Eriksson. Dahlia Bailiff, MD, FACS [phone] Trauma Surgeon 11/24/2014

## 2014-11-24 NOTE — Progress Notes (Signed)
Physical Therapy Treatment Patient Details Name: Edward Singleton MRN: [id number] DOB: Jan 17, 1951 Today's Date: 11/24/2014    History of Present Illness pt sustained fall at home resulting in L distal humerus fx, L rib fxs, and L pelvic fxs.      PT Comments    Pt with good participation during mobility today, but limited by pain in pelvis during transfer.  Pt will still continue to benefit from CIR at D/C.  Will continue to follow.    Follow Up Recommendations  CIR     Equipment Recommendations  None recommended by PT    Recommendations for Other Services       Precautions / Restrictions Precautions Precautions: Fall Required Braces or Orthoses: Other Brace/Splint;Sling Other Brace/Splint: L UE splint.   Restrictions Weight Bearing Restrictions: Yes RUE Weight Bearing: Weight bearing as tolerated LUE Weight Bearing: Non weight bearing RLE Weight Bearing: Weight bearing as tolerated LLE Weight Bearing: Weight bearing as tolerated    Mobility  Bed Mobility Overal bed mobility: Needs Assistance;+2 for physical assistance Bed Mobility: Supine to Sit     Supine to sit: Mod assist;+2 for physical assistance     General bed mobility comments: pt A with mobility and actively moves all 4 extremities.    Transfers Overall transfer level: Needs assistance Equipment used: 2 person hand held assist Transfers: Sit to/from [employer] Sit to Stand: +2 physical assistance;Mod assist Stand pivot transfers: +2 physical assistance;Mod assist       General transfer comment: pt needs facilitation for anterior wt shifting with coming to stand and movement through pivot.    Ambulation/Gait                 Stairs            Wheelchair Mobility    Modified Rankin (Stroke Patients Only)       Balance Overall balance assessment: Needs assistance Sitting-balance support: Single extremity supported;Feet supported Sitting balance-Leahy Scale:  Poor Sitting balance - Comments: pt leaning forward and to right Postural control: Right lateral lean Standing balance support: During functional activity Standing balance-Leahy Scale: Poor                      Cognition Arousal/Alertness: Awake/alert Behavior During Therapy: Flat affect;Impulsive Overall Cognitive Status: Impaired/Different from baseline Area of Impairment: Orientation;Attention;Memory;Following commands;Safety/judgement;Awareness;Problem solving Orientation Level: Disoriented to;Time;Situation Current Attention Level: Sustained Memory: Decreased recall of precautions;Decreased short-term memory Following Commands: Follows one step commands inconsistently Safety/Judgement: Decreased awareness of safety;Decreased awareness of deficits Awareness: Intellectual Problem Solving: Slow processing;Decreased initiation;Difficulty sequencing;Requires verbal cues;Requires tactile cues General Comments: pt conversant and participating well today.  pt with poor recall of maintaining NWBing on L UE despite multiple cues.      Exercises      General Comments        Pertinent Vitals/Pain Pain Assessment: Faces Faces Pain Scale: Hurts even more Pain Location: pt grimaces and calls out during mobility.   Pain Descriptors / Indicators: Grimacing Pain Intervention(s): Monitored during session;Premedicated before session;Repositioned    Home Living                      Prior Function            PT Goals (current goals can now be found in the care plan section) Acute Rehab PT Goals Patient Stated Goal: wanting to get OOB and go to work PT Goal Formulation: With patient Time For Goal Achievement:  12/02/14 Potential to Achieve Goals: Good Progress towards PT goals: Progressing toward goals    Frequency  Min 3X/week    PT Plan Current plan remains appropriate    Co-evaluation             End of Session Equipment Utilized During Treatment: Gait  belt Activity Tolerance: Patient limited by pain Patient left: in chair;with call bell/phone within reach;with chair alarm set     Time: [id number] PT Time Calculation (min) (ACUTE ONLY): 23 min  Charges:  $Therapeutic Activity: 23-37 mins                    G CodesCatarina Hartshorn, [place]orm[place] 11/24/2014, 10:34 AM
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
## 2014-11-24 NOTE — Progress Notes (Signed)
Rehab admissions - I am following pt's case and met with pt this am. Pt was seated up in chair and answered my questions regarding his baseline function. Pt has transfer orders and will soon transfer to [facility].  I have been in communication with [facility] R[facility] representative and we will need insurance authorization to consider possible inpatient rehab admit. Michael, trauma PA indicated that pt may likely be medically ready for CIR tomorrow.  I will check on pt's status tomorrow and keep the pt/family and medical team aware of any updates.  Nanetta Batty, PT Rehabilitation Admissions Coordinator [phone]

## 2014-11-25 ENCOUNTER — Inpatient Hospital Stay (HOSPITAL_COMMUNITY)
Admission: RE | Admit: 2014-11-25 | Discharge: 2014-12-08 | DRG: 560 | Disposition: A | Discharge status: Skilled Nursing Facility | Payer: 59 | Source: Intra-hospital | Attending: Physical Medicine & Rehabilitation | Admitting: Physical Medicine & Rehabilitation

## 2014-11-25 DIAGNOSIS — S42402A Unspecified fracture of lower end of left humerus, initial encounter for closed fracture: Secondary | ICD-10-CM | POA: Diagnosis present

## 2014-11-25 DIAGNOSIS — M109 Gout, unspecified: Secondary | ICD-10-CM | POA: Diagnosis not present

## 2014-11-25 DIAGNOSIS — S42412D Displaced simple supracondylar fracture without intercondylar fracture of left humerus, subsequent encounter for fracture with routine healing: Secondary | ICD-10-CM | POA: Diagnosis present

## 2014-11-25 DIAGNOSIS — W19XXXD Unspecified fall, subsequent encounter: Secondary | ICD-10-CM

## 2014-11-25 DIAGNOSIS — S42402S Unspecified fracture of lower end of left humerus, sequela: Secondary | ICD-10-CM

## 2014-11-25 DIAGNOSIS — N390 Urinary tract infection, site not specified: Secondary | ICD-10-CM | POA: Diagnosis not present

## 2014-11-25 DIAGNOSIS — S3282XA Multiple fractures of pelvis without disruption of pelvic ring, initial encounter for closed fracture: Secondary | ICD-10-CM | POA: Diagnosis present

## 2014-11-25 DIAGNOSIS — A499 Bacterial infection, unspecified: Secondary | ICD-10-CM

## 2014-11-25 DIAGNOSIS — S32810D Multiple fractures of pelvis with stable disruption of pelvic ring, subsequent encounter for fracture with routine healing: Secondary | ICD-10-CM

## 2014-11-25 DIAGNOSIS — E785 Hyperlipidemia, unspecified: Secondary | ICD-10-CM | POA: Diagnosis not present

## 2014-11-25 DIAGNOSIS — F0390 Unspecified dementia without behavioral disturbance: Secondary | ICD-10-CM | POA: Diagnosis not present

## 2014-11-25 DIAGNOSIS — I10 Essential (primary) hypertension: Secondary | ICD-10-CM

## 2014-11-25 DIAGNOSIS — Z79899 Other long term (current) drug therapy: Secondary | ICD-10-CM

## 2014-11-25 DIAGNOSIS — D62 Acute posthemorrhagic anemia: Secondary | ICD-10-CM | POA: Diagnosis not present

## 2014-11-25 DIAGNOSIS — F1010 Alcohol abuse, uncomplicated: Secondary | ICD-10-CM

## 2014-11-25 DIAGNOSIS — F17210 Nicotine dependence, cigarettes, uncomplicated: Secondary | ICD-10-CM

## 2014-11-25 DIAGNOSIS — B9620 Unspecified Escherichia coli [E. coli] as the cause of diseases classified elsewhere: Secondary | ICD-10-CM | POA: Diagnosis not present

## 2014-11-25 DIAGNOSIS — S3282XS Multiple fractures of pelvis without disruption of pelvic ring, sequela: Secondary | ICD-10-CM

## 2014-11-25 DIAGNOSIS — R413 Other amnesia: Secondary | ICD-10-CM | POA: Insufficient documentation

## 2014-11-25 LAB — CBC
HEMATOCRIT: 27 % — AB (ref 39.0–52.0)
HEMOGLOBIN: 9.2 g/dL — AB (ref 13.0–17.0)
MCH: 34.6 pg — ABNORMAL HIGH (ref 26.0–34.0)
MCHC: 34.1 g/dL (ref 30.0–36.0)
MCV: 101.5 fL — AB (ref 78.0–100.0)
PLATELETS: 510 10*3/uL — AB (ref 150–400)
RBC: 2.66 MIL/uL — ABNORMAL LOW (ref 4.22–5.81)
RDW: 17.3 % — AB (ref 11.5–15.5)
WBC: 15.4 10*3/uL — ABNORMAL HIGH (ref 4.0–10.5)

## 2014-11-25 LAB — CREATININE, SERUM
CREATININE: 1.32 mg/dL (ref 0.50–1.35)
GFR calc Af Amer: 65 mL/min — ABNORMAL LOW (ref >90)
GFR, EST NON AFRICAN AMERICAN: 56 mL/min — AB (ref >90)

## 2014-11-25 MED ORDER — TRAZODONE HCL 50 MG PO TABS
50.0000 mg | ORAL_TABLET | Freq: Every evening | ORAL | Status: DC | PRN
  Administered 2014-11-25 – 2014-12-01 (×6): 50 mg via ORAL
  Filled 2014-11-25 (×7): qty 1

## 2014-11-25 MED ORDER — MORPHINE SULFATE 2 MG/ML IJ SOLN: 2.0000 mg | INTRAMUSCULAR | Status: DC | PRN

## 2014-11-25 MED ORDER — SIMVASTATIN 40 MG PO TABS
40.0000 mg | ORAL_TABLET | Freq: Every day | ORAL | Status: DC
  Administered 2014-11-26 – 2014-12-07 (×12): 40 mg via ORAL
  Filled 2014-11-25 (×13): qty 1

## 2014-11-25 MED ORDER — ALPRAZOLAM 0.5 MG PO TABS
0.5000 mg | ORAL_TABLET | Freq: Three times a day (TID) | ORAL | Status: DC
  Administered 2014-11-25 – 2014-12-08 (×39): 0.5 mg via ORAL
  Filled 2014-11-25 (×12): qty 1
  Filled 2014-11-25: qty 2
  Filled 2014-11-25 (×27): qty 1

## 2014-11-25 MED ORDER — ALPRAZOLAM 0.5 MG PO TABS
0.5000 mg | ORAL_TABLET | Freq: Three times a day (TID) | ORAL | Status: DC
  Administered 2014-11-25: 0.5 mg via ORAL
  Filled 2014-11-25: qty 1

## 2014-11-25 MED ORDER — IPRATROPIUM-ALBUTEROL 0.5-2.5 (3) MG/3ML IN SOLN: 3.0000 mL | RESPIRATORY_TRACT | Status: DC | PRN

## 2014-11-25 MED ORDER — SORBITOL 70 % SOLN
30.0000 mL | Freq: Every day | Status: DC | PRN
  Administered 2014-11-25: 30 mL via ORAL
  Filled 2014-11-25: qty 30

## 2014-11-25 MED ORDER — ENOXAPARIN SODIUM 40 MG/0.4ML ~~LOC~~ SOLN: 40.0000 mg | SUBCUTANEOUS | Status: DC

## 2014-11-25 MED ORDER — ADULT MULTIVITAMIN W/MINERALS CH
1.0000 | ORAL_TABLET | Freq: Every day | ORAL | Status: DC
Start: 2014-11-26 — End: 2014-12-08
  Administered 2014-11-26 – 2014-12-08 (×13): 1 via ORAL
  Filled 2014-11-25 (×14): qty 1

## 2014-11-25 MED ORDER — OXYCODONE HCL 5 MG PO TABS
5.0000 mg | ORAL_TABLET | ORAL | Status: DC | PRN
  Administered 2014-11-25 – 2014-12-01 (×24): 15 mg via ORAL
  Administered 2014-12-02 (×2): 10 mg via ORAL
  Administered 2014-12-03 – 2014-12-05 (×10): 15 mg via ORAL
  Administered 2014-12-05 (×2): 10 mg via ORAL
  Administered 2014-12-05: 15 mg via ORAL
  Administered 2014-12-05 – 2014-12-06 (×2): 10 mg via ORAL
  Administered 2014-12-06 – 2014-12-08 (×11): 15 mg via ORAL
  Filled 2014-11-25 (×6): qty 3
  Filled 2014-11-25: qty 2
  Filled 2014-11-25 (×9): qty 3
  Filled 2014-11-25: qty 2
  Filled 2014-11-25 (×13): qty 3
  Filled 2014-11-25: qty 2
  Filled 2014-11-25 (×2): qty 3
  Filled 2014-11-25: qty 2
  Filled 2014-11-25 (×13): qty 3
  Filled 2014-11-25: qty 2
  Filled 2014-11-25 (×4): qty 3

## 2014-11-25 MED ORDER — ENOXAPARIN SODIUM 40 MG/0.4ML ~~LOC~~ SOLN
40.0000 mg | SUBCUTANEOUS | Status: DC
  Administered 2014-11-25 – 2014-12-07 (×13): 40 mg via SUBCUTANEOUS
  Filled 2014-11-25 (×14): qty 0.4

## 2014-11-25 MED ORDER — POLYETHYLENE GLYCOL 3350 17 G PO PACK
17.0000 g | PACK | Freq: Every day | ORAL | Status: DC
  Administered 2014-11-27 – 2014-12-07 (×9): 17 g via ORAL
  Filled 2014-11-25 (×14): qty 1

## 2014-11-25 MED ORDER — VITAMIN B-1 100 MG PO TABS
100.0000 mg | ORAL_TABLET | Freq: Every day | ORAL | Status: DC
  Administered 2014-11-26 – 2014-12-08 (×13): 100 mg via ORAL
  Filled 2014-11-25 (×14): qty 1

## 2014-11-25 MED ORDER — ONDANSETRON HCL 4 MG PO TABS
4.0000 mg | ORAL_TABLET | Freq: Four times a day (QID) | ORAL | Status: DC | PRN
  Administered 2014-11-27: 4 mg via ORAL
  Filled 2014-11-25: qty 1

## 2014-11-25 MED ORDER — DOCUSATE SODIUM 100 MG PO CAPS
100.0000 mg | ORAL_CAPSULE | Freq: Two times a day (BID) | ORAL | Status: DC
  Administered 2014-11-25 – 2014-12-08 (×25): 100 mg via ORAL
  Filled 2014-11-25 (×29): qty 1

## 2014-11-25 MED ORDER — ONDANSETRON HCL 4 MG/2ML IJ SOLN: 4.0000 mg | Freq: Four times a day (QID) | INTRAMUSCULAR | Status: DC | PRN

## 2014-11-25 MED ORDER — ALLOPURINOL 150 MG HALF TABLET
150.0000 mg | ORAL_TABLET | Freq: Every day | ORAL | Status: DC
  Administered 2014-11-26 – 2014-12-08 (×13): 150 mg via ORAL
  Filled 2014-11-25 (×14): qty 1

## 2014-11-25 MED ORDER — FOLIC ACID 1 MG PO TABS
1.0000 mg | ORAL_TABLET | Freq: Every day | ORAL | Status: DC
  Administered 2014-11-26 – 2014-12-08 (×13): 1 mg via ORAL
  Filled 2014-11-25 (×14): qty 1

## 2014-11-25 MED ORDER — ENSURE COMPLETE PO LIQD
237.0000 mL | Freq: Two times a day (BID) | ORAL | Status: DC
  Administered 2014-11-27 – 2014-12-08 (×14): 237 mL via ORAL

## 2014-11-25 MED ORDER — ACETAMINOPHEN 325 MG PO TABS
325.0000 mg | ORAL_TABLET | ORAL | Status: DC | PRN
  Administered 2014-11-26 (×2): 650 mg via ORAL
  Filled 2014-11-25 (×3): qty 2

## 2014-11-25 NOTE — Progress Notes (Signed)
Patient being transferred to [facility] for inpatient rehab. Report called and given to Barnett Applebaum, Therapist, sports. All belongings sent with patient.

## 2014-11-25 NOTE — Progress Notes (Signed)
[facility]th[facility]edic [facility] Progress Note  Subjective  Doing ok Going to CIR  No new issues   Review of Systems  Constitutional: Negative for fever and chills.  Respiratory: Negative for shortness of breath.   Musculoskeletal:       L elbow is sore   Neurological: Negative for tingling and sensory change.     Objective   BP 140/72 mmHg  Pulse 112  Temp(Src) 100.5 F (38.1 C) (Oral)  Resp 16  Ht 5\' 7"  (1.702 m)  Wt 74.844 kg (165 lb)  BMI 25.84 kg/m2  SpO2 94%  Intake/Output      02/15 0701 - 02/16 0700 02/16 0701 - 02/17 0700   P.O. 360 120   I.V. (mL/kg) 435.3 (5.8)    Total Intake(mL/kg) 795.3 (10.6) 120 (1.6)   Urine (mL/kg/hr) 925 (0.5) 800 (2)   Total Output 925 800   Net -129.7 -680        Urine Occurrence 1 x       Exam  Gen: resting comfortably in bed, NAD  Ext:       Left Upper extremity   Splint removed  Traumatic wound dorsolateral forearm is stable, this is the wound that has been weeping some serous drainage  Surgical incision is stable  No drainage from op wound   Swelling stable             Ext warm             + Radial pulse             R/U/M motor and sensory functions intact             AIN/PIN motor intact    Assessment and Plan   POD/HD#: 50   64 y/o male s/p fall with L distal humerus fracture  1. Fall  2. L distal humerus fracture s/p ORIF             NWB L upper extremity               new dressing applied today, soft dressing             AROM/PROM L elbow    Active and passive motion L forearm, wrist and hand             Ice and elevate             PT/OT  Sling for comfort only when ambulating o/w want sling off    3. Left LC1 pelvic ring fracture            WBAT B                 4. Dispo             CIR  Will follow up with pt on Friday for another dressing change   Please call with questions in the interim    Jari Pigg, PA-C [facility] [phone]  (O) 11/25/2014 12:23 PM
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
## 2014-11-25 NOTE — Progress Notes (Signed)
UR completed.  To CIR today.  Sandi Mariscal, RN BSN [place] CCM Trauma/Neuro ICU Case Manager [phone]

## 2014-11-25 NOTE — Progress Notes (Signed)
Patient ID: Edward Singleton, male   DOB: 01/13/1951, 64 y.o.   MRN: [id number]   LOS: 8 days   Subjective: No c/o.   Objective: Vital signs in last 24 hours: Temp:  [98 F (36.7 C)-100.5 F (38.1 C)] 100.5 F (38.1 C) (02/16 0447) Pulse Rate:  [90-112] 112 (02/16 0447) Resp:  [11-16] 16 (02/16 0447) BP: (111-168)/(62-88) 140/72 mmHg (02/16 0447) SpO2:  [93 %-98 %] 94 % (02/16 0447) Last BM Date:  (PTA)   Physical Exam General appearance: alert and no distress Resp: clear to auscultation bilaterally Cardio: regular rate and rhythm GI: normal findings: bowel sounds normal and soft, non-tender Extremities: NVI   Assessment/Plan: Fall Multiple left rib fxs -- Pulmonary toilet Left distal humerus fx s/p ORIF -- NWB Right sup/inf pubic rami fxs/left sacral ala fx -- WBAT Acute on chronic anemia -- Stable Acute on chronic kidney disease -- Resolved CV -- No further tachyarrhythmias, troponins negative, d/c tele Daily EtOH use -- CIWA Illicit benzodiazepine use -- Will give scheduled Xanax to prevent withdrawal Asplenism FEN -- No issues VTE -- SCD's, Lovenox (Increase with improved renal fxn) Dispo -- To CIR today    Lisette Abu, PA-C Pager: [phone] General Trauma PA Pager: [phone]  11/25/2014

## 2014-11-25 NOTE — Interval H&P Note (Signed)
Edward Singleton was admitted today to Inpatient Rehabilitation with the diagnosis of pelvic and humerus fx's.  The patient's history has been reviewed, patient examined, and there is no change in status.  Patient continues to be appropriate for intensive inpatient rehabilitation.  I have reviewed the patient's chart and labs.  Questions were answered to the patient's satisfaction.  [name] T 11/25/2014, 5:51 PM
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
## 2014-11-25 NOTE — Discharge Summary (Signed)
Physician Discharge Summary  Patient ID: [name] MRN: [id number] DOB/AGE: 63-Apr-1952 63 y.o.  Admit date: 11/17/2014 Discharge date: 11/25/2014  Discharge Diagnoses Patient Active Problem List   Diagnosis Date Noted  . Acute blood loss anemia 11/25/2014  . Protein-calorie malnutrition, severe 11/19/2014  . Fracture of distal humerus 11/18/2014  . Multiple stable closed lateral compression fractures of pelvis, Left LC1 pelvic ring fx  11/18/2014  . Fall with multiple fractures 11/17/2014  . HTN (hypertension) 11/17/2014  . Acute on PRESUMED chronic kidney failure 11/17/2014  . Benzodiazepine abuse 11/17/2014  . Alcohol abuse 11/17/2014  . Dyslipidemia 11/17/2014  . History of nonadherence to medical treatment 11/17/2014  . Macrocytic anemia 11/17/2014    Consultants Drs. Edward Singleton and Edward Singleton for orthopedic surgery  Dr. Oren Singleton for internal medicine  Dr. Alger Singleton for PM&R  Dr. Merrie Singleton for critical care medicine   Procedures 2/9 -- ORIF of left distal humerus fracture by Dr. Marcelino Singleton   HPI: Edward Singleton was at home when he tripped and fell the day prior to admission. He denied losing consciousness but said he had to rest for about 2 hours afterwards. He got up the following morning and attempted to go to work but hurt too bad and came to the ED for evaluation. His workup included CT scans of the head, cervical spine, chest, abdomen, and pelvis and showed the above-mentioned injuries. He was admitted to the trauma service. Orthopedic surgery and internal medicine were consulted, the latter to evaluate the patient's apparent pre-existing anemia and acute on chronic kidney disease.   Hospital Course: The patient went the following day for fixation of his humerus fracture. Following this he remained on the ventilator and was consulted on by critical care medicine. He was able to be extubated shortly afterwards. His pelvis was deemed stable by orthopedic  surgery and he was allowed to bear weight as tolerated. Despite our best efforts, the patient quickly began to display signs and symptoms of withdrawal, likely from both alcohol and benzodiazepines. He did receive a transfusion of 1 unit packed red blood cells for his anemia. This was determined to likely be from alcoholism. His renal function returned to normal with conservative management. He was mobilized with physical and occupational therapies who recommended inpatient rehabilitation. They were consulted and agreed with admission. Once he was stable he was transferred there in good condition.   Inpatient medications Scheduled Meds: . allopurinol  150 mg Oral Daily  . ALPRAZolam  0.5 mg Oral TID  . antiseptic oral rinse  7 mL Mouth Rinse BID  . docusate sodium  100 mg Oral BID  . enoxaparin (LOVENOX) injection  40 mg Subcutaneous Q24H  . feeding supplement (ENSURE COMPLETE)  237 mL Oral BID BM  . folic acid  1 mg Oral Daily  . multivitamin with minerals  1 tablet Oral Daily  . polyethylene glycol  17 g Oral Daily  . simvastatin  40 mg Oral Daily  . thiamine  100 mg Oral Daily   Continuous Infusions:  PRN Meds:.haloperidol lactate, ipratropium-albuterol, LORazepam **OR** LORazepam, morphine injection, ondansetron **OR** ondansetron (ZOFRAN) IV, oxyCODONE  Home medications   Medication List    STOP taking these medications        oxyCODONE-acetaminophen 5-325 MG per tablet  Commonly known as:  PERCOCET/ROXICET      TAKE these medications        allopurinol 300 MG tablet  Commonly known as:  ZYLOPRIM  Take 150 mg by  mouth daily.     simvastatin 40 MG tablet  Commonly known as:  ZOCOR  Take 40 mg by mouth daily.            Follow-up Information    Follow up with Edward Box, MD.   Specialty:  Orthopedic Surgery   Contact information:   [address] [place] [address] [phone]       Follow up with Edward Kayser, MD.   Specialty:   Internal Medicine   Contact information:   [address] [place] [address] [phone]       Follow up with [facility].   Why:  As needed   Contact information:   [address] [phone]       Signed: Lisette Abu, PA-C Pager: [phone] General Trauma PA Pager: [phone] 11/25/2014, 7:48 AM

## 2014-11-25 NOTE — H&P (View-Only) (Signed)
Physical Medicine and Rehabilitation Admission H&P    Chief Complaint  Patient presents with  . Fall  . Weakness  : HPI:  HPI: Edward Singleton is a 64 y.o. right handed male admitted 11/17/2014 after a fall while at home. Denied any chest pain syncope related to fall. Patient does have a history of alcohol abuse as well as illicit benzodiazepine use. Independent PTA living alone and still working. Cranial CT scan no acute intracranial abnormalities  and a small right frontal scalp hematoma. Alcohol levels were negative on admission. CT cervical spine negative. x-rays and imaging showed mildly displaced fractures of left obturator ring fx involving the left inferior pubic ramus and puboacetabular junction as well as displaced left supracondylar humerus fracture. Orthopedic service was consulted and underwent ORIF of left distal humerus fracture 11/18/2014 per Dr. Marcelino Scot. Nonweightbearing left upper extremity. Weightbearing as tolerated lower extremities conservative care of pelvic ring fracture. Hospital course pain management. Subcutaneous Lovenox for DVT prophylaxis. Acute blood loss anemia 7.3 transfused lattest hemoglobin 9.5. Patient's ongoing bouts of agitation and restlessness suspect alcohol withdrawal protocol implemented. Physical therapy evaluation completed recommendations of physical medicine rehabilitation consult  ROS Review of Systems  Musculoskeletal: Positive for myalgias, back pain and joint pain.  All other systems reviewed and are negative   Past Medical History  Diagnosis Date  . Hypertension   . Hypercholesteremia   . Gout    Past Surgical History  Procedure Laterality Date  . Nephrectomy    . Orif humerus fracture Left 11/18/2014    Procedure: OPEN REDUCTION INTERNAL FIXATION (ORIF) DISTAL HUMERUS FRACTURE;  Surgeon: Rozanna Box, MD;  Location: [facility];  Service: Orthopedics;  Laterality: Left;   History reviewed. No pertinent family history. Social History:   reports that he has been smoking.  He does not have any smokeless tobacco history on file. He reports that he drinks alcohol. His drug history is not on file. Allergies: No Known Allergies Medications Prior to Admission  Medication Sig Dispense Refill  . allopurinol (ZYLOPRIM) 300 MG tablet Take 150 mg by mouth daily.     Marland Kitchen oxyCODONE-acetaminophen (PERCOCET/ROXICET) 5-325 MG per tablet Take 2 tablets by mouth every 4 (four) hours as needed for pain. (Patient not taking: Reported on 11/17/2014) 15 tablet 0  . simvastatin (ZOCOR) 40 MG tablet Take 40 mg by mouth daily.      Home: Home Living Family/patient expects to be discharged to:: Inpatient rehab Living Arrangements: Other relatives, Non-relatives/Friends   Functional History: Prior Function Level of Independence: Independent Comments: pt works and drives.  Functional Status:  Mobility: Bed Mobility Overal bed mobility: +2 for physical assistance, Needs Assistance Bed Mobility: Supine to Sit Rolling: Min assist Sidelying to sit: Max assist, +2 for physical assistance Supine to sit: Mod assist, +2 for physical assistance Sit to sidelying: Mod assist, +2 for physical assistance General bed mobility comments: pt utilizes bed rail to A with rolling to R side, but needs increased A with coming to sitting and returning to supine.   Transfers Overall transfer level: Needs assistance Equipment used: 2 person hand held assist Transfers: Sit to/from Stand, Stand Pivot Transfers Sit to Stand: +2 physical assistance, Mod assist Stand pivot transfers: +2 physical assistance, Mod assist General transfer comment: pt needs feet blocked and visual fixation to help pt maintain midline for balance.  pt keeps LEs with wide BOS and and stays somewhat flexed posture.        ADL: ADL Overall ADL's :  Needs assistance/impaired Eating/Feeding: Minimal assistance, Sitting Eating/Feeding Details (indicate cue type and reason): with cues to stay on  task Grooming: Wash/dry face, Sitting Grooming Details (indicate cue type and reason): with cues to stay on task and throughness Upper Body Bathing: Moderate assistance Upper Body Bathing Details (indicate cue type and reason): with cues to stay on task Lower Body Bathing: Total assistance (with mod A +2 sit<>stand and cues to stay on task) Upper Body Dressing : Total assistance ( cues to stay on task) Lower Body Dressing: Total assistance (with mod A +2 sit<>stand and cues to stay on task) Toilet Transfer: Moderate assistance, +2 for physical assistance, Stand-pivot (bed>recliner going to pt's left and cues to stay on task) Toileting- Clothing Manipulation and Hygiene: Total assistance (with mod A +2 sit<>stand and cues to stay on task)  Cognition: Cognition Overall Cognitive Status: Impaired/Different from baseline Orientation Level: Oriented to person, Oriented to place, Disoriented to time, Disoriented to situation (sometimes not to place) Cognition Arousal/Alertness: Awake/alert Behavior During Therapy: Flat affect, Impulsive (wanting to get OOB, several times I had to ask him to stop and wait until we were ready to help him) Overall Cognitive Status: Impaired/Different from baseline Area of Impairment: Orientation, Attention, Following commands, Safety/judgement, Problem solving Orientation Level:  (initally disoriented to who is baby sister was (beginning of session), but then knew at end of session. Could not recall his deceased brother's names, but got all of his living sisters' names correct.) Current Attention Level: Sustained Memory: Decreased short-term memory, Decreased recall of precautions Following Commands: Follows one step commands inconsistently Safety/Judgement:  (keeps trying to use his LUE even though in cast and sling) Awareness: Intellectual Problem Solving: Slow processing, Decreased initiation, Difficulty sequencing, Requires verbal cues, Requires tactile  cues General Comments: pt more alert than this am and conversant with PT.  pt needed frequent re-orienting to injuries, especially L UE.    Physical Exam: Blood pressure 116/73, pulse 97, temperature 98 F (36.7 C), temperature source Oral, resp. rate 11, height $RemoveBe'5\' 7"'[id number]$  (1.702 m), weight 74.844 kg (165 lb), SpO2 98 %. Physical Exam  HENT:  Head: Normocephalic.  Eyes: EOM are normal.  Neck: Normal range of motion. Neck supple. No tracheal deviation present. No thyromegaly present.  Cardiovascular: Normal rate and regular rhythm.   Respiratory: Effort normal and breath sounds normal. No respiratory distress.  GI: Soft. Bowel sounds are normal. He exhibits no distension.  Musculoskeletal:  Left upper extremity in splint and sling  Neurological: He is alert.  Patient is oriented to person but not place and time. He did answer simple yes no questions. Followed simple commands. He did appear somewhat anxious during exam. Decrease insight and awareness.  Moves all 4's with some limitation proximally more than distally, left more than right (inconsistent). Stocking glove sensory loss from the knees to the feet. Also sensory loss to pin prick in the hands.   Skin: Skin is warm and dry.  Numerous scabs and scars on limbs, trunk. Left arm dressed.   Psychiatric:  anxious   dorsal left arm with dressing in place changes as per orthopedic services    Results for orders placed or performed during the hospital encounter of 11/17/14 (from the past 48 hour(s))  CBC with Differential/Platelet     Status: Abnormal   Collection Time: 11/23/14  2:40 AM  Result Value Ref Range   WBC 19.7 (H) 4.0 - 10.5 K/uL   RBC 2.95 (L) 4.22 - 5.81 MIL/uL   Hemoglobin 10.0 (  L) 13.0 - 17.0 g/dL   HCT 30.2 (L) 39.0 - 52.0 %   MCV 102.4 (H) 78.0 - 100.0 fL   MCH 33.9 26.0 - 34.0 pg   MCHC 33.1 30.0 - 36.0 g/dL   RDW 17.7 (H) 11.5 - 15.5 %   Platelets 366 150 - 400 K/uL    Comment: DELTA CHECK NOTED   Neutrophils  Relative % 69 43 - 77 %   Neutro Abs 13.5 (H) 1.7 - 7.7 K/uL   Lymphocytes Relative 12 12 - 46 %   Lymphs Abs 2.4 0.7 - 4.0 K/uL   Monocytes Relative 17 (H) 3 - 12 %   Monocytes Absolute 3.4 (H) 0.1 - 1.0 K/uL   Eosinophils Relative 2 0 - 5 %   Eosinophils Absolute 0.3 0.0 - 0.7 K/uL   Basophils Relative 0 0 - 1 %   Basophils Absolute 0.1 0.0 - 0.1 K/uL  Basic metabolic panel     Status: Abnormal   Collection Time: 11/23/14  2:40 AM  Result Value Ref Range   Sodium 136 135 - 145 mmol/L   Potassium 4.0 3.5 - 5.1 mmol/L   Chloride 101 96 - 112 mmol/L   CO2 27 19 - 32 mmol/L   Glucose, Bld 95 70 - 99 mg/dL   BUN 14 6 - 23 mg/dL   Creatinine, Ser 1.58 (H) 0.50 - 1.35 mg/dL   Calcium 8.1 (L) 8.4 - 10.5 mg/dL   GFR calc non Af Amer 45 (L) >90 mL/min   GFR calc Af Amer 52 (L) >90 mL/min    Comment: (NOTE) The eGFR has been calculated using the CKD EPI equation. This calculation has not been validated in all clinical situations. eGFR's persistently <90 mL/min signify possible Chronic Kidney Disease.    Anion gap 8 5 - 15  Urinalysis, Routine w reflex microscopic     Status: None   Collection Time: 11/23/14  2:43 PM  Result Value Ref Range   Color, Urine YELLOW YELLOW   APPearance CLEAR CLEAR   Specific Gravity, Urine 1.013 1.005 - 1.030   pH 5.5 5.0 - 8.0   Glucose, UA NEGATIVE NEGATIVE mg/dL   Hgb urine dipstick NEGATIVE NEGATIVE   Bilirubin Urine NEGATIVE NEGATIVE   Ketones, ur NEGATIVE NEGATIVE mg/dL   Protein, ur NEGATIVE NEGATIVE mg/dL   Urobilinogen, UA 0.2 0.0 - 1.0 mg/dL   Nitrite NEGATIVE NEGATIVE   Leukocytes, UA NEGATIVE NEGATIVE    Comment: MICROSCOPIC NOT DONE ON URINES WITH NEGATIVE PROTEIN, BLOOD, LEUKOCYTES, NITRITE, OR GLUCOSE <1000 mg/dL.  Troponin I (q 6hr x 3)     Status: None   Collection Time: 11/23/14  8:13 PM  Result Value Ref Range   Troponin I <0.03 <0.031 ng/mL    Comment:        NO INDICATION OF MYOCARDIAL INJURY.   Creatinine, serum      Status: Abnormal   Collection Time: 11/24/14  1:58 AM  Result Value Ref Range   Creatinine, Ser 1.31 0.50 - 1.35 mg/dL   GFR calc non Af Amer 57 (L) >90 mL/min   GFR calc Af Amer 66 (L) >90 mL/min    Comment: (NOTE) The eGFR has been calculated using the CKD EPI equation. This calculation has not been validated in all clinical situations. eGFR's persistently <90 mL/min signify possible Chronic Kidney Disease.   CBC with Differential/Platelet     Status: Abnormal   Collection Time: 11/24/14  1:58 AM  Result Value Ref Range  WBC 16.3 (H) 4.0 - 10.5 K/uL   RBC 2.71 (L) 4.22 - 5.81 MIL/uL   Hemoglobin 9.5 (L) 13.0 - 17.0 g/dL   HCT 28.3 (L) 39.0 - 52.0 %   MCV 104.4 (H) 78.0 - 100.0 fL   MCH 35.1 (H) 26.0 - 34.0 pg   MCHC 33.6 30.0 - 36.0 g/dL   RDW 17.9 (H) 11.5 - 15.5 %   Platelets 389 150 - 400 K/uL   Neutrophils Relative % 62 43 - 77 %   Neutro Abs 10.1 (H) 1.7 - 7.7 K/uL   Lymphocytes Relative 19 12 - 46 %   Lymphs Abs 3.1 0.7 - 4.0 K/uL   Monocytes Relative 17 (H) 3 - 12 %   Monocytes Absolute 2.8 (H) 0.1 - 1.0 K/uL   Eosinophils Relative 2 0 - 5 %   Eosinophils Absolute 0.3 0.0 - 0.7 K/uL   Basophils Relative 0 0 - 1 %   Basophils Absolute 0.0 0.0 - 0.1 K/uL  Troponin I (q 6hr x 3)     Status: None   Collection Time: 11/24/14  1:58 AM  Result Value Ref Range   Troponin I <0.03 <0.031 ng/mL    Comment:        NO INDICATION OF MYOCARDIAL INJURY.    No results found.     Medical Problem List and Plan: 1. Functional deficits secondary to multitrauma after a fall with left distal humerus fracture status post ORIF. Nonweightbearing left upper extremity and splint was discontinued 11/26/2015. Left C1 pelvic ring fracture weightbearing as tolerated. 2.  DVT Prophylaxis/Anticoagulation: Subcutaneous Lovenox. Check vascular study 3. Pain Management: Oxycodone as needed. Monitor with increased mobility 4. Mood/agitation: Xanax 0.5 mg tid. Check sleep cycle. Bed alarm for  safety 5. Neuropsych: This patient is not capable of making decisions on his own behalf. 6. Skin/Wound Care: Routine skin checks 7. Fluids/Electrolytes/Nutrition: Strict I&O with follow-up chemistries 8. History of alcohol abuse as well as illicit benzodiazepine use. Monitor for any signs of withdrawal. Discuss with family 9. Hyperlipidemia. Zocor 10. History of gout. Monitor for any signs of flareup/continue allopurinol    Post Admission Physician Evaluation: 1. Functional deficits secondary  to TBI, polytrauma. 2. Patient is admitted to receive collaborative, interdisciplinary care between the physiatrist, rehab nursing staff, and therapy team. 3. Patient's level of medical complexity and substantial therapy needs in context of that medical necessity cannot be provided at a lesser intensity of care such as a SNF. 4. Patient has experienced substantial functional loss from his/her baseline which was documented above under the "Functional History" and "Functional Status" headings.  Judging by the patient's diagnosis, physical exam, and functional history, the patient has potential for functional progress which will result in measurable gains while on inpatient rehab.  These gains will be of substantial and practical use upon discharge  in facilitating mobility and self-care at the household level. 5. Physiatrist will provide 24 hour management of medical needs as well as oversight of the therapy plan/treatment and provide guidance as appropriate regarding the interaction of the two. 6. 24 hour rehab nursing will assist with bladder management, bowel management, safety, skin/wound care, disease management, medication administration, pain management and patient education  and help integrate therapy concepts, techniques,education, etc. 7. PT will assess and treat for/with: Lower extremity strength, range of motion, stamina, balance, functional mobility, safety, adaptive techniques and equipment, NMR,  ortho precautions,pain.   Goals are: supervision. 8. OT will assess and treat for/with: ADL's, functional mobility, safety,  upper extremity strength, adaptive techniques and equipment, NMR, ortho precautions, cognitive perceptual awareness.   Goals are: supervision. Therapy may not yet proceed with showering this patient. 9. SLP will assess and treat for/with: cognition, communication, behavior.  Goals are: supervision to mod I. 10. Case Management and Social Worker will assess and treat for psychological issues and discharge planning. 11. Team conference will be held weekly to assess progress toward goals and to determine barriers to discharge. 12. Patient will receive at least 3 hours of therapy per day at least 5 days per week. 13. ELOS: 12-18 days       14. Prognosis:  good     Meredith Staggers, MD, [facility] 11/25/2014   11/24/2014
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
## 2014-11-25 NOTE — Progress Notes (Signed)
Pt admitted to unit at 1500 with sister at bedside. Reviewed rehab process, booklet, and safety plan with pt and sister. Pt resting call bell within reach, bed alarm on, SRx3 with sitter at bedside.

## 2014-11-26 ENCOUNTER — Inpatient Hospital Stay (HOSPITAL_COMMUNITY): Payer: 59

## 2014-11-26 ENCOUNTER — Inpatient Hospital Stay (HOSPITAL_COMMUNITY): Payer: 59 | Admitting: Speech Pathology

## 2014-11-26 ENCOUNTER — Inpatient Hospital Stay (HOSPITAL_COMMUNITY): Payer: Self-pay | Admitting: Speech Pathology

## 2014-11-26 ENCOUNTER — Inpatient Hospital Stay (HOSPITAL_COMMUNITY): Payer: 59 | Admitting: Occupational Therapy

## 2014-11-26 DIAGNOSIS — M7989 Other specified soft tissue disorders: Secondary | ICD-10-CM

## 2014-11-26 LAB — CBC WITH DIFFERENTIAL/PLATELET
BASOS ABS: 0 10*3/uL (ref 0.0–0.1)
BASOS PCT: 0 % (ref 0–1)
EOS PCT: 1 % (ref 0–5)
Eosinophils Absolute: 0.1 10*3/uL (ref 0.0–0.7)
HCT: 30.1 % — ABNORMAL LOW (ref 39.0–52.0)
Hemoglobin: 10 g/dL — ABNORMAL LOW (ref 13.0–17.0)
Lymphocytes Relative: 13 % (ref 12–46)
Lymphs Abs: 2.6 10*3/uL (ref 0.7–4.0)
MCH: 34.8 pg — AB (ref 26.0–34.0)
MCHC: 33.2 g/dL (ref 30.0–36.0)
MCV: 104.9 fL — AB (ref 78.0–100.0)
MONO ABS: 2.5 10*3/uL — AB (ref 0.1–1.0)
Monocytes Relative: 13 % — ABNORMAL HIGH (ref 3–12)
Neutro Abs: 14.6 10*3/uL — ABNORMAL HIGH (ref 1.7–7.7)
Neutrophils Relative %: 73 % (ref 43–77)
Platelets: 558 10*3/uL — ABNORMAL HIGH (ref 150–400)
RBC: 2.87 MIL/uL — AB (ref 4.22–5.81)
RDW: 17.5 % — AB (ref 11.5–15.5)
WBC: 19.9 10*3/uL — ABNORMAL HIGH (ref 4.0–10.5)

## 2014-11-26 LAB — COMPREHENSIVE METABOLIC PANEL
ALBUMIN: 2.5 g/dL — AB (ref 3.5–5.2)
ALT: 32 U/L (ref 0–53)
ANION GAP: 9 (ref 5–15)
AST: 59 U/L — AB (ref 0–37)
Alkaline Phosphatase: 177 U/L — ABNORMAL HIGH (ref 39–117)
BUN: 10 mg/dL (ref 6–23)
CHLORIDE: 95 mmol/L — AB (ref 96–112)
CO2: 27 mmol/L (ref 19–32)
Calcium: 8.2 mg/dL — ABNORMAL LOW (ref 8.4–10.5)
Creatinine, Ser: 1.34 mg/dL (ref 0.50–1.35)
GFR calc non Af Amer: 55 mL/min — ABNORMAL LOW (ref >90)
GFR, EST AFRICAN AMERICAN: 64 mL/min — AB (ref >90)
Glucose, Bld: 135 mg/dL — ABNORMAL HIGH (ref 70–99)
Potassium: 3.7 mmol/L (ref 3.5–5.1)
SODIUM: 131 mmol/L — AB (ref 135–145)
TOTAL PROTEIN: 4.9 g/dL — AB (ref 6.0–8.3)
Total Bilirubin: 1.2 mg/dL (ref 0.3–1.2)

## 2014-11-26 LAB — VITAMIN D 1,25 DIHYDROXY
VITAMIN D3 1, 25 (OH): 23 pg/mL
Vitamin D 1, 25 (OH)2 Total: 23 pg/mL

## 2014-11-26 MED ORDER — CHLORDIAZEPOXIDE HCL 5 MG PO CAPS
10.0000 mg | ORAL_CAPSULE | Freq: Three times a day (TID) | ORAL | Status: DC
  Administered 2014-11-26 – 2014-12-08 (×38): 10 mg via ORAL
  Filled 2014-11-26 (×38): qty 2

## 2014-11-26 MED ORDER — NICOTINE 21 MG/24HR TD PT24
21.0000 mg | MEDICATED_PATCH | Freq: Every day | TRANSDERMAL | Status: DC
Start: 2014-11-26 — End: 2014-11-28
  Administered 2014-11-26 – 2014-11-28 (×3): 21 mg via TRANSDERMAL
  Filled 2014-11-26 (×4): qty 1

## 2014-11-26 MED ORDER — QUETIAPINE FUMARATE 50 MG PO TABS
50.0000 mg | ORAL_TABLET | Freq: Every evening | ORAL | Status: DC | PRN
  Filled 2014-11-26 (×2): qty 1

## 2014-11-26 NOTE — Progress Notes (Signed)
Patient asked for pain medication twice during shift, prn Oxy IR 15mg  given both times.  Prn Trazodone 50mg  given as patient asked for sleep aide.  Patient dozed off for about an hour and then awake for remainder of shift.  Hard to tell how truly effective pain medication was as patient became more agitated and confused throughout shift and if he was in pain anymore or not.  Patient insistent on getting out of bed and quick to move; sitter at bedside.  Patient in chair with quick-release belt on.    Prn sorbitol given 11/25/14 day shift effective as patient had several stools during night.    Will continue to monitor.

## 2014-11-26 NOTE — Evaluation (Signed)
Physical Therapy Assessment and Plan  Patient Details  Name: Edward Singleton MRN: [id number] Date of Birth: 11/12/1950  PT Diagnosis: Difficulty walking, Edema, Low back pain, Muscle weakness and Pain in left upper extremity and left lower extremity, Cognitive deficits Rehab Potential: Good ELOS: 14-18 days   Today's Date: 11/26/2014 PT Individual Time: 1430-1530 PT Individual Time Calculation (min): 60 min    Problem List:  Patient Active Problem List   Diagnosis Date Noted  . Acute blood loss anemia 11/25/2014  . Pelvic fracture 11/25/2014  . Protein-calorie malnutrition, severe 11/19/2014  . Closed fracture of left distal humerus 11/18/2014  . Multiple stable closed lateral compression fractures of pelvis, Left LC1 pelvic ring fx  11/18/2014  . Fall with multiple fractures 11/17/2014  . HTN (hypertension) 11/17/2014  . Acute on PRESUMED chronic kidney failure 11/17/2014  . Benzodiazepine abuse 11/17/2014  . Alcohol abuse 11/17/2014  . Dyslipidemia 11/17/2014  . History of nonadherence to medical treatment 11/17/2014  . Macrocytic anemia 11/17/2014    Past Medical History:  Past Medical History  Diagnosis Date  . Hypertension   . Hypercholesteremia   . Gout    Past Surgical History:  Past Surgical History  Procedure Laterality Date  . Nephrectomy    . Orif humerus fracture Left 11/18/2014    Procedure: OPEN REDUCTION INTERNAL FIXATION (ORIF) DISTAL HUMERUS FRACTURE;  Surgeon: Rozanna Box, MD;  Location: [place];  Service: Orthopedics;  Laterality: Left;    Assessment & Plan Clinical Impression: Edward Singleton is a 64 y.o. right handed male admitted 11/17/2014 after a fall while at home. Denied any chest pain syncope related to fall. Patient does have a history of alcohol abuse as well as illicit benzodiazepine use. Independent PTA living alone and still working. Cranial CT scan no acute intracranial abnormalities and a small right frontal scalp hematoma. Alcohol  levels were negative on admission. CT cervical spine negative. x-rays and imaging showed mildly displaced fractures of left obturator ring fx involving the left inferior pubic ramus and puboacetabular junction as well as displaced left supracondylar humerus fracture. Orthopedic service was consulted and underwent ORIF of left distal humerus fracture 11/18/2014 per Dr. Marcelino Scot. Nonweightbearing left upper extremity. Weightbearing as tolerated lower extremities conservative care of pelvic ring fracture. Hospital course pain management. Subcutaneous Lovenox for DVT prophylaxis. Acute blood loss anemia 7.3 transfused lattest hemoglobin 9.5. Patient's ongoing bouts of agitation and restlessness suspect alcohol withdrawal protocol implemented. Patient transferred to CIR on 11/25/2014.   Patient currently requires min to +2 assist with mobility secondary to muscle weakness, decreased cardiorespiratory endurance, pain, decreased balance, and decreased balance strategies.  Prior to hospitalization, patient was independent  with mobility and lived with Alone in a House home.  Home access is 4 at side door (per pt)Stairs to enter.  Patient will benefit from skilled PT intervention to maximize safe functional mobility, minimize fall risk and decrease caregiver burden for planned discharge home with 24 hour supervision.  Anticipate patient will benefit from follow up [place] at discharge.  PT - End of Session Activity Tolerance: Tolerates 30+ min activity with multiple rests Endurance Deficit: Yes Endurance Deficit Description: pt required frequent seated and one supine rest break PT Assessment Rehab Potential (ACUTE/IP ONLY): Good Barriers to Discharge: Inaccessible home environment;Decreased caregiver support PT Patient demonstrates impairments in the following area(s): Balance;Behavior;Edema;Endurance;Motor;Nutrition;Pain;Safety PT Transfers Functional Problem(s): Bed Mobility;Bed to Chair;Car;Furniture PT Locomotion  Functional Problem(s): Ambulation;Wheelchair Mobility;Stairs PT Plan PT Intensity: Minimum of 1-2 x/day ,45 to 90  minutes PT Frequency: 5 out of 7 days PT Duration Estimated Length of Stay: 14-18 days PT Treatment/Interventions: Ambulation/gait training;Balance/vestibular training;Cognitive remediation/compensation;Discharge planning;DME/adaptive equipment instruction;Functional mobility training;Pain management;Patient/family education;Stair training;Therapeutic Activities;Therapeutic Exercise;UE/LE Strength taining/ROM;UE/LE Coordination activities;Wheelchair propulsion/positioning PT Transfers Anticipated Outcome(s): supervision PT Locomotion Anticipated Outcome(s): supervision household ambulator PT Recommendation Follow Up Recommendations: Home health PT;24 hour supervision/assistance Patient destination: Home Equipment Recommended: To be determined  Skilled Therapeutic Intervention Skilled therapeutic intervention initiated after completion of evaluation. Patient required physical assist resulting in need for +2 assist at times to maintain LUE NWB precautions. Patient fatigued quickly with mobility requiring seated rest and one supine rest break due to increased back pain as well as increased L sided pain. Pt left supine in bed with LUE elevated and all needs within reach, sitter and sister Hassan Rowan present in room.   PT Evaluation Precautions/Restrictions Precautions Precautions: Fall Required Braces or Orthoses: Other Brace/Splint;Sling Other Brace/Splint: LUE cast Restrictions Weight Bearing Restrictions: Yes RUE Weight Bearing: Weight bearing as tolerated LUE Weight Bearing: Non weight bearing RLE Weight Bearing: Weight bearing as tolerated LLE Weight Bearing: Weight bearing as tolerated General Chart Reviewed: Yes Family/Caregiver Present: No  Pain Pain Assessment Pain Assessment: Faces Pain Score: 8  Faces Pain Scale: Hurts even more Pain Type: Acute pain Pain Location:  Arm (headache) Pain Orientation: Left Pain Onset: On-going Pain Intervention(s): Medication (See eMAR) Home Living/Prior Functioning Home Living Available Help at Discharge: Neighbor;Available PRN/intermittently Type of Home: House Home Access: Stairs to enter [employer] of Steps: 4 at side door (per pt) Entrance Stairs-Rails: Can reach both Home Layout: One level Additional Comments: patient poor historian, will need to verify information  Lives With: Alone Prior Function Level of Independence: Independent with basic ADLs;Independent with homemaking with ambulation;Independent with gait;Independent with transfers  Able to Take Stairs?: Yes Driving: Yes Vocation: Full time employment 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 Leisure: Hobbies-yes (Comment) Comments: yardwork, cut wood, fishing and hunting Vision/Perception   See OT evaluation  Cognition Overall Cognitive Status: Impaired/Different from baseline Arousal/Alertness: Awake/alert Orientation Level: Oriented to place;Oriented to person;Disoriented to time;Disoriented to situation Attention: Sustained Sustained Attention: Impaired Sustained Attention Impairment: Verbal basic;Functional basic Memory: Impaired Memory Impairment: Decreased short term memory;Retrieval deficit;Decreased recall of new information;Storage deficit Decreased Short Term Memory: Verbal basic;Functional basic Awareness: Impaired Awareness Impairment: Intellectual impairment;Emergent impairment Problem Solving: Impaired Problem Solving Impairment: Functional basic Executive Function: Decision Making Decision Making: Impaired Decision Making Impairment: Functional basic Behaviors: Impulsive Safety/Judgment: Impaired Sensation Sensation Light Touch: Appears Intact Hot/Cold: Appears Intact Additional Comments: L side more painful to light touch than R side Coordination Gross Motor Movements are Fluid  and Coordinated: No Fine Motor Movements are Fluid and Coordinated: No Coordination and Movement Description: limited by pain throughout L side Motor  Motor Motor: Within Functional Limits Motor - Skilled Clinical Observations: generalized weakness and painful  Mobility Bed Mobility Bed Mobility: Sit to Supine;Supine to Sit Supine to Sit: 4: Min assist;HOB flat Supine to Sit Details: Verbal cues for precautions/safety;Verbal cues for technique Sit to Supine: 4: Min assist;HOB flat Sit to Supine - Details: Verbal cues for technique;Verbal cues for precautions/safety Transfers Transfers: Yes Sit to Stand: 4: Min assist;From bed;From chair/3-in-1 Sit to Stand Details: Verbal cues for sequencing;Verbal cues for precautions/safety;Manual facilitation for weight shifting Sit to Stand Details (indicate cue type and reason): cues to maintain LUE NWB Stand to Sit: 4: Min assist;To chair/3-in-1;To bed Stand to Sit Details (indicate cue type and reason): Verbal cues for  precautions/safety;Verbal cues for sequencing;Manual facilitation for weight shifting Stand to Sit Details: cues to maintain LUE NWB Stand Pivot Transfers: 4: Min assist;3: Mod assist Stand Pivot Transfer Details: Manual facilitation for weight shifting;Verbal cues for sequencing;Verbal cues for precautions/safety Locomotion  Ambulation Ambulation: Yes Ambulation/Gait Assistance: 1: +2 Total assist Ambulation Distance (Feet): 10 Feet Assistive device: 2 person hand held assist (3 musketeers) Gait Gait: Yes Gait Pattern: Impaired Gait Pattern: Step-to pattern;Decreased weight shift to left;Decreased stride length;Antalgic;Narrow base of support Gait velocity: decreased Stairs / Additional Locomotion Stairs: Yes Stairs Assistance: 1: +2 Total assist Stairs Assistance Details: Verbal cues for technique;Verbal cues for precautions/safety;Verbal cues for sequencing Stairs Assistance Details (indicate cue type and reason):  overall min A, +2 to maintain LUE NWB Stair Management Technique: One rail Right;Step to pattern;Forwards;Backwards (ascend forwards, descend backwards) Number of Stairs: 2 Height of Stairs: 5 Ramp: Not tested (comment) Curb: Not tested (comment) Wheelchair Mobility Wheelchair Mobility: Yes Wheelchair Assistance: 3: Building surveyor Details: Verbal cues for technique;Verbal cues for sequencing;Verbal cues for Information systems manager: Right upper extremity Wheelchair Parts Management: Needs assistance Distance: 50  Trunk/Postural Assessment  Cervical Assessment Cervical Assessment: Exceptions to [facility] (forward head) Thoracic Assessment Thoracic Assessment: Exceptions to [facility] (thoracic kyphosis) Lumbar Assessment Lumbar Assessment: Within Functional Limits Postural Control Postural Control: Deficits on evaluation Protective Responses: impaired/pain limited  Balance Balance Balance Assessed: Yes Dynamic Sitting Balance Dynamic Sitting - Balance Support: During functional activity;Feet supported Dynamic Sitting - Level of Assistance: 5: Stand by assistance Static Standing Balance Static Standing - Balance Support: During functional activity;Right upper extremity supported Static Standing - Level of Assistance: 4: Min assist Dynamic Standing Balance Dynamic Standing - Balance Support: During functional activity Dynamic Standing - Level of Assistance: 4: Min assist Extremity Assessment  RUE Assessment RUE Assessment: Within Functional Limits (5/5 strength) LUE Assessment LUE Assessment: Exceptions to WFL LUE AROM (degrees) LUE Overall AROM Comments: AROM at wrist and elbow limited by cast; AROM WFL at shoulder RLE Assessment RLE Assessment: Exceptions to [facility] RLE Strength RLE Overall Strength: Deficits;Due to pain RLE Overall Strength Comments: grossly 3+/5 throughout LLE Assessment LLE Assessment: Exceptions to [facility] LLE Strength LLE Overall  Strength: Deficits;Due to pain LLE Overall Strength Comments: grossly 3-/5 throughout  FIM:  FIM - Bed/Chair Transfer Bed/Chair Transfer: 3: Chair or W/C > Bed: Mod A (lift or lower assist);3: Bed > Chair or W/C: Mod A (lift or lower assist);4: Supine > Sit: Min A (steadying Pt. > 75%/lift 1 leg);4: Sit > Supine: Min A (steadying pt. > 75%/lift 1 leg) FIM - Locomotion: Wheelchair Distance: 50 Locomotion: Wheelchair: 2: Travels 50 - 149 ft with moderate assistance (Pt: 50 - 74%) FIM - Locomotion: Ambulation Locomotion: Ambulation Assistive Devices: Other (comment) (3 musketeers) Ambulation/Gait Assistance: 1: +2 Total assist Locomotion: Ambulation: 1: Two helpers FIM - Locomotion: Stairs Locomotion: Scientist, physiological: Insurance account manager - 1 Locomotion: Stairs: 1: Two helpers   Refer to [employer] for Long Term Goals  Recommendations for other services: None  Discharge Criteria: Patient will be discharged from PT if patient refuses treatment 3 consecutive times without medical reason, if treatment goals not met, if there is a change in medical status, if patient makes no progress towards goals or if patient is discharged from hospital.  The above assessment, treatment plan, treatment alternatives and goals were discussed and mutually agreed upon: by patient  Laretta Alstrom 11/26/2014, 4:12 PM

## 2014-11-26 NOTE — Evaluation (Signed)
Speech Language Pathology Assessment and Plan  Patient Details  Name: Edward Singleton MRN: [id number] Date of Birth: [birth year]  SLP Diagnosis: Cognitive Impairments  Rehab Potential: Good ELOS: 14-18 days    Today's Date: 11/26/2014 SLP Individual Time: 1330-1430 SLP Individual Time Calculation (min): 60 min   Problem List:  Patient Active Problem List   Diagnosis Date Noted  . Acute blood loss anemia 11/25/2014  . Pelvic fracture 11/25/2014  . Protein-calorie malnutrition, severe 11/19/2014  . Closed fracture of left distal humerus 11/18/2014  . Multiple stable closed lateral compression fractures of pelvis, Left LC1 pelvic ring fx  11/18/2014  . Fall with multiple fractures 11/17/2014  . HTN (hypertension) 11/17/2014  . Acute on PRESUMED chronic kidney failure 11/17/2014  . Benzodiazepine abuse 11/17/2014  . Alcohol abuse 11/17/2014  . Dyslipidemia 11/17/2014  . History of nonadherence to medical treatment 11/17/2014  . Macrocytic anemia 11/17/2014   Past Medical History:  Past Medical History  Diagnosis Date  . Hypertension   . Hypercholesteremia   . Gout    Past Surgical History:  Past Surgical History  Procedure Laterality Date  . Nephrectomy    . Orif humerus fracture Left 11/18/2014    Procedure: OPEN REDUCTION INTERNAL FIXATION (ORIF) DISTAL HUMERUS FRACTURE;  Surgeon: Rozanna Box, MD;  Location: [place];  Service: Orthopedics;  Laterality: Left;    Assessment / Plan / Recommendation Clinical Impression Edward Singleton is a 64 y.o. right handed male admitted 11/17/2014 after a fall while at home. Denied any chest pain syncope related to fall. Patient does have a history of alcohol abuse as well as illicit benzodiazepine use. Independent PTA living alone and still working. Cranial CT scan no acute intracranial abnormalities and a small right frontal scalp hematoma. Alcohol levels were negative on admission. CT cervical spine negative. x-rays and imaging showed  mildly displaced fractures of left obturator ring fx involving the left inferior pubic ramus and puboacetabular junction as well as displaced left supracondylar humerus fracture. Orthopedic service was consulted and underwent ORIF of left distal humerus fracture 11/18/2014 per Dr. Marcelino Scot. Nonweightbearing left upper extremity. Weightbearing as tolerated lower extremities conservative care of pelvic ring fracture. Hospital course pain management. Patient's ongoing bouts of agitation and restlessness suspect alcohol withdrawal protocol implemented. Physical therapy evaluation completed with recommendations of physical medicine rehabilitation consult. Patient admitted to CIR on 11/25/14 and was administered a cognitive-linguistic evaluation. Patient demonstrates moderate cognitive impairments characterized by decreased intellectual awareness of deficits, intermittent confusion, functional problem-solving, safety awareness, and short-term memory deficits, which impact his ability to perform functional and familiar tasks safety. Patient was able to sustain attention in a quiet environment for duration of evaluation, and language comprehension and expression appears intact. Min A multimodal cues were required for patient to complete functional problem-solving tasks and patient needed overall Mod-Max A multimodal cues for short-term recall. Decoding and reading comprehension appears intact, although patient needs Mod A multimodal cues for short-term recall during comprehension tasks. Patient would benefit from skilled SLP services to maximize cognitive function in order to maximize functional independence and reduce burden of care prior to discharge. Anticipate that patient will need 24/7 supervision and follow up speech therapy services.   Skilled Therapeutic Interventions          Patient was administered cognitive-linguistic assessment, please see above for details. Patient educated on current cognitive deficits and  goals of skilled SLP intervention. Patient verbalized understanding but will require reinforcement.  SLP Assessment  Patient will need  skilled [place] [facility]ology Services during CIR admission    Recommendations  Oral Care Recommendations: Oral care BID Recommendations for Other Services: Neuropsych consult Patient destination: [place] (SNF) (versus home) Follow up Recommendations: Home Health SLP;Outpatient SLP;24 hour supervision/assistance;Skilled Nursing facility Equipment Recommended: None recommended by SLP    SLP Frequency 3 to 5 out of 7 days   SLP Treatment/Interventions Cognitive remediation/compensation;Cueing hierarchy;Functional tasks;Therapeutic Activities;Patient/family education;Environmental controls;Internal/external aids    Pain Pain Assessment Pain Assessment: No/Denies Pain Prior Functioning Type of Home: House  Lives With: Alone Available Help at Discharge: Neighbor;Available PRN/intermittently Vocation: Full time employment  Short Term Goals: Week 1: SLP Short Term Goal 1 (Week 1): Patient will orient to time and situation with Mod A multimodal cueing. SLP Short Term Goal 2 (Week 1): Patient will demonstrate functional problem solving for basic daily situations with Mod A multimodal cueing. SLP Short Term Goal 3 (Week 1): Patient will use external memory aids to recall new, daily information with Mod  A multimodal cueing. SLP Short Term Goal 4 (Week 1): Patient will maintain selective attention in a mildy distracting environment with Mod A multimodal cueing for ~10 minutes. SLP Short Term Goal 5 (Week 1): Patient will identify 2 physical and 2 cognitive imapirments with Mod A multimodal cues. SLP Short Term Goal 6 (Week 1): Patient will utilize callbell to request assistance with Mod A multimodal cues.  See FIM for current functional status Refer to Care Plan for Long Term Goals  Recommendations for other services:  Neuropsych  Discharge Criteria: Patient will be discharged from SLP if patient refuses treatment 3 consecutive times without medical reason, if treatment goals not met, if there is a change in medical status, if patient makes no progress towards goals or if patient is discharged from hospital.  The above assessment, treatment plan, treatment alternatives and goals were discussed and mutually agreed upon: No family available/patient unable  Servando Snare 11/26/2014, 4:53 PM

## 2014-11-26 NOTE — Evaluation (Signed)
Occupational Therapy Assessment and Plan  Patient Details  Name: Edward Singleton MRN: [id number] Date of Birth: July 19, 1951  OT Diagnosis: acute pain, cognitive deficits, muscle weakness (generalized) and pain in joint Rehab Potential: Rehab Potential (ACUTE ONLY): Good ELOS: 14-18 days   Today's Date: 11/26/2014 OT Individual Time: 0900-1000 OT Individual Time Calculation (min): 60 min     Problem List:  Patient Active Problem List   Diagnosis Date Noted  . Acute blood loss anemia 11/25/2014  . Pelvic fracture 11/25/2014  . Protein-calorie malnutrition, severe 11/19/2014  . Closed fracture of left distal humerus 11/18/2014  . Multiple stable closed lateral compression fractures of pelvis, Left LC1 pelvic ring fx  11/18/2014  . Fall with multiple fractures 11/17/2014  . HTN (hypertension) 11/17/2014  . Acute on PRESUMED chronic kidney failure 11/17/2014  . Benzodiazepine abuse 11/17/2014  . Alcohol abuse 11/17/2014  . Dyslipidemia 11/17/2014  . History of nonadherence to medical treatment 11/17/2014  . Macrocytic anemia 11/17/2014    Past Medical History:  Past Medical History  Diagnosis Date  . Hypertension   . Hypercholesteremia   . Gout    Past Surgical History:  Past Surgical History  Procedure Laterality Date  . Nephrectomy    . Orif humerus fracture Left 11/18/2014    Procedure: OPEN REDUCTION INTERNAL FIXATION (ORIF) DISTAL HUMERUS FRACTURE;  Surgeon: Rozanna Box, MD;  Location: [facility];  Service: Orthopedics;  Laterality: Left;    Assessment & Plan Clinical Impression: Edward Singleton is a 64 y.o. right handed male admitted 11/17/2014 after a fall while at home. Denied any chest pain syncope related to fall. Patient does have a history of alcohol abuse as well as illicit benzodiazepine use. Independent PTA living alone and still working. Cranial CT scan no acute intracranial abnormalities and a small right frontal scalp hematoma. Alcohol levels were negative on  admission. CT cervical spine negative. x-rays and imaging showed mildly displaced fractures of left obturator ring fx involving the left inferior pubic ramus and puboacetabular junction as well as displaced left supracondylar humerus fracture. Orthopedic service was consulted and underwent ORIF of left distal humerus fracture 11/18/2014 per Dr. Marcelino Scot. Nonweightbearing left upper extremity. Weightbearing as tolerated lower extremities conservative care of pelvic ring fracture. Hospital course pain management. Subcutaneous Lovenox for DVT prophylaxis. Acute blood loss anemia 7.3 transfused lattest hemoglobin 9.5. Patient's ongoing bouts of agitation and restlessness suspect alcohol withdrawal protocol implemented.  Patient transferred to CIR on 11/25/2014 .    Patient currently requires min-mod assist with basic self-care skills secondary to muscle weakness, decreased cardiorespiratoy endurance, decreased initiation, decreased attention, decreased awareness, decreased problem solving, decreased safety awareness and decreased memory and decreased standing balance, decreased postural control, decreased balance strategies and difficulty maintaining precautions.  Prior to hospitalization, patient could complete BADLs and IADLs with independent .  Patient will benefit from skilled intervention to increase independence with basic self-care skills prior to discharge home with care partner.  Anticipate patient will require 24 hour supervision and follow up home health.  OT - End of Session Activity Tolerance: Decreased this session Endurance Deficit: Yes Endurance Deficit Description: frequent rest breaks OT Assessment Rehab Potential (ACUTE ONLY): Good Barriers to Discharge: Decreased caregiver support OT Patient demonstrates impairments in the following area(s): Balance;Cognition;Endurance;Motor;Sensory;Safety;Perception OT Basic ADL's Functional Problem(s): Eating;Grooming;Bathing;Dressing;Toileting OT  Advanced ADL's Functional Problem(s): Simple Meal Preparation OT Transfers Functional Problem(s): Toilet;Tub/Shower OT Additional Impairment(s): Fuctional Use of Upper Extremity (NWB LUE) OT Plan OT Intensity: Minimum of 1-2 x/day, 45  to 90 minutes OT Frequency: 5 out of 7 days OT Duration/Estimated Length of Stay: 14-18 days OT Treatment/Interventions: Cognitive remediation/compensation;Balance/vestibular training;Community reintegration;Discharge planning;DME/adaptive equipment instruction;Neuromuscular re-education;Psychosocial support;Patient/family education;Self Care/advanced ADL retraining;Pain management;Functional mobility training;Therapeutic Activities;Splinting/orthotics;Therapeutic Exercise;UE/LE Strength taining/ROM;Visual/perceptual remediation/compensation;UE/LE Coordination activities;Wheelchair propulsion/positioning OT Self Feeding Anticipated Outcome(s): supervision OT Basic Self-Care Anticipated Outcome(s): supervision  OT Toileting Anticipated Outcome(s): supervision OT Bathroom Transfers Anticipated Outcome(s): supervision OT Recommendation Patient destination: Home Follow Up Recommendations: Home health OT Equipment Recommended: To be determined   Skilled Therapeutic Intervention OT eval completed. Discussed role of OT, goals of therapy, safety plan, fall risk, possible LOS, and follow-up. Pt received sitting in geri chair with sitter present and pt agreeable to therapy. Pt oriented x4 with mod cues. Pt stating "I think I fell out of my truck or in my house." Engaged in bathing and dressing with focus on sustained attention, adherence to precautions, task initiation, sequencing, and activity tolerance. Pt required min cues for initiating bathing and max cues for sequencing as pt spot washing (likely due to sponge bath being unfamiliar). Pt with no clothing on eval therefore donned undergarments and socks with gown. Pt required assist for managing LLE due to pain. Pt  required min A for sit<>stand multiple times during therapy session. Ambulated with min HHA approx 6 feet geri chair>bed with cues for narrowing BOS. Pt required max cues for adherence to NWB precautions throughout session. Pt left supine in bed asleep with sitter present.   OT Evaluation Precautions/Restrictions  Precautions Precautions: Fall Required Braces or Orthoses: Other Brace/Splint;Sling Other Brace/Splint: LUE cast Restrictions Weight Bearing Restrictions: Yes RUE Weight Bearing: Weight bearing as tolerated LUE Weight Bearing: Non weight bearing RLE Weight Bearing: Weight bearing as tolerated LLE Weight Bearing: Weight bearing as tolerated General   Vital Signs  Pain Pain Assessment Pain Assessment: 0-10 Pain Score: 8  Pain Type: Acute pain Pain Location: Rib cage Pain Orientation: Left Home Living/Prior Functioning Home Living Type of Home: House Home Access: Stairs to enter Entrance Stairs-Number of Steps: 4  (per pt) Home Layout: Two level Additional Comments: home living per pt (poor historian)  Lives With: Alone Prior Function Level of Independence: Independent with basic ADLs, Independent with homemaking with ambulation, Independent with gait, Independent with transfers Driving: Yes Vocation: Full time employment Comments: pt works and drives. ADL   Vision/Perception  Vision- History Baseline Vision/History: Wears glasses Wears Glasses: At all times Patient Visual Report: No change from baseline;Other (comment) (pt reporting some difficulty reading due to no glasses) Vision- Assessment Vision Assessment?: Yes Eye Alignment: Impaired (comment) (L eye diviated outward) Ocular Range of Motion: Within Functional Limits Alignment/Gaze Preference: Within Defined Limits Tracking/Visual Pursuits: Requires cues, head turns, or add eye shifts to track Saccades: Within functional limits Convergence: Impaired - to be further tested in functional context Visual  Fields: No apparent deficits  Cognition Arousal/Alertness: Awake/alert Orientation Level: Oriented to time;Oriented to person;Oriented to place (min cues for orientation to situation) Attention: Sustained Sustained Attention: Impaired Sustained Attention Impairment: Verbal basic;Functional basic Memory: Impaired Memory Impairment: Decreased recall of new information Awareness: Impaired Awareness Impairment: Intellectual impairment;Emergent impairment Problem Solving: Impaired Problem Solving Impairment: Functional basic Behaviors: Impulsive;Restless Safety/Judgment: Impaired Sensation Sensation Light Touch: Appears Intact Hot/Cold: Appears Intact Coordination Gross Motor Movements are Fluid and Coordinated: No Fine Motor Movements are Fluid and Coordinated: No Coordination and Movement Description: limited by pain  Finger Nose Finger Test: unable to complete with LUE due to pain Motor  Motor Motor: Within Functional Limits Mobility  Bed Mobility Bed Mobility: Sit to Supine Sit to Supine: 4: Min assist Sit to Supine - Details: Manual facilitation for placement Transfers Transfers: Sit to Stand;Stand to Sit Sit to Stand: 4: Min assist Sit to Stand Details: Tactile cues for initiation;Verbal cues for sequencing;Verbal cues for precautions/safety;Manual facilitation for weight shifting Stand to Sit: 4: Min assist Stand to Sit Details (indicate cue type and reason): Tactile cues for initiation;Verbal cues for precautions/safety;Verbal cues for sequencing;Manual facilitation for weight shifting  Trunk/Postural Assessment  Cervical Assessment Cervical Assessment: Within Functional Limits Thoracic Assessment Thoracic Assessment: Within Functional Limits Lumbar Assessment Lumbar Assessment: Within Functional Limits Postural Control Postural Control: Deficits on evaluation  Balance Balance Balance Assessed: Yes Dynamic Sitting Balance Dynamic Sitting - Balance Support: During  functional activity;Feet supported Dynamic Sitting - Level of Assistance: 5: Stand by assistance Static Standing Balance Static Standing - Balance Support: During functional activity;Right upper extremity supported Static Standing - Level of Assistance: 4: Min assist Dynamic Standing Balance Dynamic Standing - Balance Support: During functional activity Dynamic Standing - Level of Assistance: 4: Min assist Extremity/Trunk Assessment RUE Assessment RUE Assessment: Within Functional Limits (5/5 strength) LUE Assessment LUE Assessment: Exceptions to WFL LUE AROM (degrees) LUE Overall AROM Comments: AROM at wrist and elbow limited by cast; AROM WFL at shoulder  FIM:  FIM - Grooming Grooming Steps: Wash, rinse, dry face;Wash, rinse, dry hands Grooming: 5: Set-up assist to obtain items FIM - Bathing Bathing Steps Patient Completed: Right Arm;Left Arm;Abdomen;Front perineal area;Right upper leg;Left upper leg Bathing: 3: Mod-Patient completes 5-7 35f 10 parts or 50-74% FIM - Upper Body Dressing/Undressing Upper body dressing/undressing: 0: Wears gown/pajamas-no public clothing FIM - Lower Body Dressing/Undressing Lower body dressing/undressing steps patient completed: Thread/unthread right underwear leg;Pull underwear up/down;Don/Doff right sock Lower body dressing/undressing: 3: Mod-Patient completed 50-74% of tasks FIM - Bed/Chair Transfer Bed/Chair Transfer: 4: Sit > Supine: Min A (steadying pt. > 75%/lift 1 leg);4: Chair or W/C > Bed: Min A (steadying Pt. > 75%) FIM - Radio producer Devices: Grab bars Toilet Transfers: 4-From toilet/BSC: Min A (steadying Pt. > 75%);4-To toilet/BSC: Min A (steadying Pt. > 75%)   Refer to Care Plan for Long Term Goals  Recommendations for other services: None  Discharge Criteria: Patient will be discharged from OT if patient refuses treatment 3 consecutive times without medical reason, if treatment goals not met, if there  is a change in medical status, if patient makes no progress towards goals or if patient is discharged from hospital.  The above assessment, treatment plan, treatment alternatives and goals were discussed and mutually agreed upon: by patient  Duayne Cal 11/26/2014, 9:43 AM

## 2014-11-26 NOTE — Progress Notes (Signed)
Patient information reviewed and entered into eRehab system by Chavez Rosol, RN, CRRN, PPS Coordinator.  Information including medical coding and functional independence measure will be reviewed and updated through discharge.    

## 2014-11-26 NOTE — Plan of Care (Signed)
Problem: RH BOWEL ELIMINATION Goal: RH STG MANAGE BOWEL W/MEDICATION W/ASSISTANCE STG Manage Bowel with Medication with [facility].  Outcome: Progressing Patient had several bowel movements after sorbitol given on prior shift.

## 2014-11-26 NOTE — Progress Notes (Signed)
  VASCULAR LAB PRELIMINARY  PRELIMINARY  PRELIMINARY  PRELIMINARY   BLEV duplex completed.    Preliminary report: NEGATIVE DVT BILATERALLY.  Lindwood Coke, RVT 11/26/2014, 11:47 AM

## 2014-11-26 NOTE — Progress Notes (Signed)
[facility] PHYSICAL MEDICINE & REHABILITATION     PROGRESS NOTE    Subjective/Complaints: Restless night. Minimal sleep. Confused and agitated. A  review of systems has been performed and if not noted above is otherwise negative.   Objective: Vital Signs: Blood pressure 134/70, pulse 106, temperature 98.2 F (36.8 C), temperature source Oral, resp. rate 18, SpO2 93 %. No results found.  Recent Labs  11/24/14 0158 11/25/14 1550  WBC 16.3* 15.4*  HGB 9.5* 9.2*  HCT 28.3* 27.0*  PLT 389 510*    Recent Labs  11/24/14 0158 11/25/14 1550  CREATININE 1.31 1.32   CBG (last 3)  No results for input(s): GLUCAP in the last 72 hours.  Wt Readings from Last 3 Encounters:  11/17/14 74.844 kg (165 lb)    Physical Exam:  Head: Normocephalic.  Eyes: EOM are normal.  Neck: Normal range of motion. Neck supple. No tracheal deviation present. No thyromegaly present.  Cardiovascular: Normal rate and regular rhythm.  Respiratory: Effort normal and breath sounds normal. No respiratory distress.  GI: Soft. Bowel sounds are normal. He exhibits no distension.  Musculoskeletal:  Left upper extremity in splint and sling  Neurological: He is alert.  Patient is oriented to person but not place and time. He did answer simple yes no questions. Followed simple commands. He did appear somewhat anxious during exam. Decrease insight and awareness. Moves all 4's with some limitation proximally more than distally, left more than right (inconsistent). Stocking glove sensory loss from the knees to the feet. Also sensory loss to pin prick in the hands.  Skin: Skin is warm and dry.  Numerous scabs and scars on limbs, trunk. Left arm dressed.  Psychiatric:  anxious   dorsal left arm with dressing in place changes as per orthopedic services  Assessment/Plan: 1. Functional deficits secondary to polytrauma including pelvic fx's, left distal humerus fx which require 3+ hours per day of  interdisciplinary therapy in a comprehensive inpatient rehab setting. Physiatrist is providing close team supervision and 24 hour management of active medical problems listed below. Physiatrist and rehab team continue to assess barriers to discharge/monitor patient progress toward functional and medical goals. FIM:       FIM - Toileting Toileting Assistive Devices: Grab bar or rail for support Toileting: 1: Total-Patient completed zero steps, helper did all 3           Comprehension Comprehension Mode: Auditory Comprehension: 4-Understands basic 75 - 89% of the time/requires cueing 10 - 24% of the time  Expression Expression Mode: Verbal Expression: 5-Expresses basic needs/ideas: With extra time/assistive device  Social Interaction Social Interaction: 4-Interacts appropriately 75 - 89% of the time - Needs redirection for appropriate language or to initiate interaction.  Problem Solving Problem Solving: 4-Solves basic 75 - 89% of the time/requires cueing 10 - 24% of the time  Memory Memory: 3-Recognizes or recalls 50 - 74% of the time/requires cueing 25 - 49% of the time  Medical Problem List and Plan: 1. Functional deficits secondary to multitrauma after a fall with left distal humerus fracture, pelvic fx's status post ORIF humerus fx. Nonweightbearing left upper extremity and splint was discontinued 11/26/2015. Left C1 pelvic ring fracture weightbearing as tolerated. 2. DVT Prophylaxis/Anticoagulation: Subcutaneous Lovenox. Check vascular study 3. Pain Management: Oxycodone as needed. Monitor with increased mobility 4. Mood/agitation: Xanax 0.5 mg tid.  -begin scheduled librium  -seroquel qhs agitation 5. Neuropsych: This patient is not capable of making decisions on his own behalf. 6. Skin/Wound Care: Routine skin  checks 7. Fluids/Electrolytes/Nutrition: Strict I&O with follow-up chemistries 8. History of alcohol abuse as well as illicit benzodiazepine use. Monitor for  any signs of withdrawal. Discuss with family 9. Hyperlipidemia. Zocor 10. History of gout. Monitor for any signs of flareup/continue allopurinol LOS (Days) 1 A FACE TO FACE EVALUATION WAS PERFORMED  Edward Singleton T 11/26/2014 8:17 AM

## 2014-11-27 ENCOUNTER — Encounter (HOSPITAL_COMMUNITY): Payer: Self-pay

## 2014-11-27 ENCOUNTER — Inpatient Hospital Stay (HOSPITAL_COMMUNITY): Payer: Self-pay

## 2014-11-27 ENCOUNTER — Inpatient Hospital Stay (HOSPITAL_COMMUNITY): Payer: 59

## 2014-11-27 ENCOUNTER — Inpatient Hospital Stay (HOSPITAL_COMMUNITY): Payer: 59 | Admitting: Speech Pathology

## 2014-11-27 LAB — URINALYSIS, ROUTINE W REFLEX MICROSCOPIC
Bilirubin Urine: NEGATIVE
GLUCOSE, UA: NEGATIVE mg/dL
Hgb urine dipstick: NEGATIVE
KETONES UR: NEGATIVE mg/dL
LEUKOCYTES UA: NEGATIVE
Nitrite: NEGATIVE
PH: 7 (ref 5.0–8.0)
Protein, ur: NEGATIVE mg/dL
Specific Gravity, Urine: 1.011 (ref 1.005–1.030)
Urobilinogen, UA: 1 mg/dL (ref 0.0–1.0)

## 2014-11-27 NOTE — Progress Notes (Signed)
11/27/14 1802 nsg Patient c/o pain on urination, nausea, back pain claims same symptoms when he has UTI. MD on call notified. New orders noted.

## 2014-11-27 NOTE — Care Management Note (Signed)
[facility] Individual Statement of Services  Patient Name:  Edward Singleton  Date:  11/27/2014  Welcome to the [facility].  Our goal is to provide you with an individualized program based on your diagnosis and situation, designed to meet your specific needs.  With this comprehensive rehabilitation program, you will be expected to participate in at least 3 hours of rehabilitation therapies Monday-Friday, with modified therapy programming on the weekends.  Your rehabilitation program will include the following services:  Physical Therapy (PT), Occupational Therapy (OT), 24 hour per day rehabilitation nursing, Neuropsychology, Case Management (Social Worker), Rehabilitation Medicine, Nutrition Services and Pharmacy Services  Weekly team conferences will be held on Tuesdays to discuss your progress.  Your Social Worker will talk with you frequently to get your input and to update you on team discussions.  Team conferences with you and your family in attendance may also be held.  Expected length of stay: 14 - 18 days  Overall anticipated outcome:  supervision  Depending on your progress and recovery, your program may change. Your Social Worker will coordinate services and will keep you informed of any changes. Your Social Worker's name and contact numbers are listed  below.  The following services may also be recommended but are not provided by the [facility] will be made to provide these services after discharge if needed.  Arrangements include referral to agencies that provide these services.  Your insurance has been verified to be:  [facility]  Your primary doctor is:  Dr. Raechel Ache  Pertinent information will be shared with your doctor and your insurance company.  Social Worker:  [place], [place] or ([phone]   Information discussed with and copy given to patient by: Lennart Pall, 11/27/2014, 1:18 PM

## 2014-11-27 NOTE — Progress Notes (Signed)
Social Work  Social Work Assessment and Plan  Patient Details  Name: Edward Singleton MRN: [id number] Date of Birth: April 16, 1951  Today's Date: 11/27/2014  Problem List:  Patient Active Problem List   Diagnosis Date Noted  . Acute blood loss anemia 11/25/2014  . Pelvic fracture 11/25/2014  . Protein-calorie malnutrition, severe 11/19/2014  . Closed fracture of left distal humerus 11/18/2014  . Multiple stable closed lateral compression fractures of pelvis, Left LC1 pelvic ring fx  11/18/2014  . Fall with multiple fractures 11/17/2014  . HTN (hypertension) 11/17/2014  . Acute on PRESUMED chronic kidney failure 11/17/2014  . Benzodiazepine abuse 11/17/2014  . Alcohol abuse 11/17/2014  . Dyslipidemia 11/17/2014  . History of nonadherence to medical treatment 11/17/2014  . Macrocytic anemia 11/17/2014   Past Medical History:  Past Medical History  Diagnosis Date  . Hypertension   . Hypercholesteremia   . Gout    Past Surgical History:  Past Surgical History  Procedure Laterality Date  . Nephrectomy    . Orif humerus fracture Left 11/18/2014    Procedure: OPEN REDUCTION INTERNAL FIXATION (ORIF) DISTAL HUMERUS FRACTURE;  Surgeon: Rozanna Box, MD;  Location: [facility];  Service: Orthopedics;  Laterality: Left;   Social History:  reports that he has been smoking.  He does not have any smokeless tobacco history on file. He reports that he drinks alcohol. His drug history is not on file.  Family / Support Systems Marital Status: Single Patient Roles: Other (Comment) Children: none Other Supports: Solmon Ice @ (C) [phone];  sister, Jasmine Nakayama @ [phone] and sister, Luna Glasgow @ [phone] - two living in [place] and one in [place]. Anticipated Caregiver: sisters not able to provide assist at home except intermittently Ability/Limitations of Caregiver: none of the sisters will be available for 24-7 supervision.  Caregiver Availability: Intermittent Family Dynamics:  Sisters are supportive, however, frustrated with pt's h/o ETOH abuse.  Willing to assist around their work and family schedules.  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 Legal Hisotry/Current Legal Issues: None Guardian/Conservator: None - per MD, pt not capable of making decisions on his own behalf.  Will monitor this as his cognition appears improved from the precious day.   Abuse/Neglect Physical Abuse: Denies Verbal Abuse: Denies Sexual Abuse: Denies Exploitation of patient/patient's resources: Denies Self-Neglect: Denies  Emotional Status Pt's affect, behavior adn adjustment status: Pt very pleasant and cooperative.  Completes interview without difficulty.  Denies any s/s of emotional distress, however, admits he feels " a little off because I can't remember anything about coming in here (hospital)" .  Reports that his "head feels clearer" today after having a full night's sleep.  Will follow and refer to neuropsychology if warranted. Recent Psychosocial Issues: None Pyschiatric History: None Substance Abuse History: Pt denies any h/o of ETOH abuse.  Reports that he drinks "a beer at night...not a problem..."    Patient / Family Perceptions, Expectations & Goals Pt/Family understanding of illness & functional limitations: Pt and family with basic understanding of pt's injuries an WB precautions/ need for CIR.   Premorbid pt/family roles/activities: Pt was completely independent, living alone and working f/t Anticipated changes in roles/activities/participation: dependent on gains made here, pt may require 24/7 care which will indicate need  to possibly d/c to SNF.  Monitoring progress. Pt/family expectations/goals: "I need  to get to a level that I can manage by myself at home."  [employer]: None Premorbid Home Care/DME Agencies: None Transportation available at discharge: yes Resource referrals recommended: Neuropsychology  Discharge Planning Living Arrangements: Alone Support Systems: Other relatives Type of Residence: Private residence Insurance Resources: Multimedia programmer (specify) Sports administrator) Financial Resources: Employment Financial Screen Referred: No Living Expenses: Education officer, community Management: Patient Does the patient have any problems obtaining your medications?: No Home Management: pt was independent with this PTA Patient/Family Preliminary Plans: Pt hopes to reach an independent level that he can d/c to his own home.  Sisters report will need to consider alternate if not indpendent. Barriers to Discharge: Family Support (very intermittently available.) Social Work Anticipated Follow Up Needs: HH/OP, SNF Expected length of stay: 14-18 days  Clinical Impression Pleasant, oriented gentleman here after a fall at home and with UE and LE fx.  Per family but pt denies, h/o ETOH abuse at home.  Living independently and working f/t PTA.  Family unable to provide 24/7 assistance.  Will monitor progress and assist with d/c planning.  Karlin Heilman 11/27/2014, 1:14 PM

## 2014-11-27 NOTE — Progress Notes (Signed)
Speech Language Pathology Daily Session Note  Patient Details  Name: Edward Singleton MRN: [id number] Date of Birth: 1950-11-18  Today's Date: 11/27/2014 SLP Individual Time: 1330-1400 SLP Individual Time Calculation (min): 30 min  Short Term Goals: Week 1: SLP Short Term Goal 1 (Week 1): Patient will orient to time and situation with Mod A multimodal cueing. SLP Short Term Goal 2 (Week 1): Patient will demonstrate functional problem solving for basic daily situations with Mod A multimodal cueing. SLP Short Term Goal 3 (Week 1): Patient will use external memory aids to recall new, daily information with Mod  A multimodal cueing. SLP Short Term Goal 4 (Week 1): Patient will maintain selective attention in a mildy distracting environment with Mod A multimodal cueing for ~10 minutes. SLP Short Term Goal 5 (Week 1): Patient will identify 2 physical and 2 cognitive imapirments with Mod A multimodal cues. SLP Short Term Goal 6 (Week 1): Patient will utilize callbell to request assistance with Mod A multimodal cues.  Skilled Therapeutic Interventions: Skilled treatment session focused on cognitive goals. Upon arrival patient was supine in bed with eyes closed but easily aroused and agreeable to participate in therapy. Student facilitated session by providing Mod A multimodal cues for recall of rules of mildly complex problem-solving card task. Patient required Mod A question and phonetic cues for orientation to place and intellectual awareness of deficits. Patient left in bed with all needs within reach and NT in room. Continue with current plan of care.   FIM:  Comprehension Comprehension Mode: Auditory Comprehension: 5-Understands basic 90% of the time/requires cueing < 10% of the time Expression Expression Mode: Verbal Expression: 5-Expresses basic needs/ideas: With no assist Social Interaction Social Interaction: 5-Interacts appropriately 90% of the time - Needs monitoring or encouragement for  participation or interaction. Problem Solving Problem Solving: 4-Solves basic 75 - 89% of the time/requires cueing 10 - 24% of the time Memory Memory: 3-Recognizes or recalls 50 - 74% of the time/requires cueing 25 - 49% of the time  Pain Pain Assessment Pain Assessment: No/denies pain  Therapy/Group: Individual Therapy  Servando Snare 11/27/2014, 2:26 PM

## 2014-11-27 NOTE — Progress Notes (Signed)
Occupational Therapy Note  Patient Details  Name: WILBON HAMMOND MRN: [id number] Date of Birth: 1951-04-21  Today's Date: 11/27/2014 OT Individual Time: 1300-1330 OT Individual Time Calculation (min): 30 min   Pt c/o 10/10 pain in lower back; RN aware, repositioned, cold applied, and rest Individual Therapy  Pt engaged in bed mobility, functional transfers, and standing balance tasks.  Pt performed sit<>stand from w/c at sink for grooming tasks.  LUE sling donned prior to transitional movements.  Pt required min A/steady A for transitional movements and standing balance.  Pt activity limited somewhat by pain in lower back/bilateral hips.   Leotis Shames [facility] 11/27/2014, 1:48 PM

## 2014-11-27 NOTE — Progress Notes (Signed)
[place] PHYSICAL MEDICINE & REHABILITATION     PROGRESS NOTE    Subjective/Complaints: Had a MUCH better night. Slept from about 11:30 to 7am.  A  review of systems has been performed and if not noted above is otherwise negative.   Objective: Vital Signs: Blood pressure 117/64, pulse 78, temperature 97.8 F (36.6 C), temperature source Oral, resp. rate 17, SpO2 95 %. No results found.  Recent Labs  11/25/14 1550 11/26/14 0732  WBC 15.4* 19.9*  HGB 9.2* 10.0*  HCT 27.0* 30.1*  PLT 510* 558*    Recent Labs  11/25/14 1550 11/26/14 0732  NA  --  131*  K  --  3.7  CL  --  95*  GLUCOSE  --  135*  BUN  --  10  CREATININE 1.32 1.34  CALCIUM  --  8.2*   CBG (last 3)  No results for input(s): GLUCAP in the last 72 hours.  Wt Readings from Last 3 Encounters:  11/17/14 74.844 kg (165 lb)    Physical Exam:  Head: Normocephalic.  Eyes: EOM are normal.  Neck: Normal range of motion. Neck supple. No tracheal deviation present. No thyromegaly present.  Cardiovascular: Normal rate and regular rhythm.  Respiratory: Effort normal and breath sounds normal. No respiratory distress.  GI: Soft. Bowel sounds are normal. He exhibits no distension.  Musculoskeletal:  Left upper extremity in splint and sling  Neurological: He is alert.  Patient is oriented to person but not place and time. He did answer simple yes no questions. Followed simple commands. He did appear somewhat anxious during exam. Decrease insight and awareness. Moves all 4's with some limitation proximally more than distally, left more than right (inconsistent). Stocking glove sensory loss from the knees to the feet. Also sensory loss to pin prick in the hands.  Skin: Skin is warm and dry.  Numerous scabs and scars on limbs, trunk. Incision intact Psychiatric:  anxious   dorsal left arm with dressing in place changes as per orthopedic services  Assessment/Plan: 1. Functional deficits secondary to polytrauma  including pelvic fx's, left distal humerus fx which require 3+ hours per day of interdisciplinary therapy in a comprehensive inpatient rehab setting. Physiatrist is providing close team supervision and 24 hour management of active medical problems listed below. Physiatrist and rehab team continue to assess barriers to discharge/monitor patient progress toward functional and medical goals. FIM: FIM - Bathing Bathing Steps Patient Completed: Right Arm, Left Arm, Abdomen, Front perineal area, Right upper leg, Left upper leg Bathing: 3: Mod-Patient completes 5-7 79f 10 parts or 50-74%  FIM - Upper Body Dressing/Undressing Upper body dressing/undressing: 0: Wears gown/pajamas-no public clothing FIM - Lower Body Dressing/Undressing Lower body dressing/undressing steps patient completed: Thread/unthread right underwear leg, Pull underwear up/down, Don/Doff right sock Lower body dressing/undressing: 3: Mod-Patient completed 50-74% of tasks  FIM - Musician Devices: Grab bar or rail for support Toileting: 1: Total-Patient completed zero steps, helper did all 3  FIM - Radio producer Devices: Grab bars Toilet Transfers: 4-From toilet/BSC: Min A (steadying Pt. > 75%), 4-To toilet/BSC: Min A (steadying Pt. > 75%)  FIM - Bed/Chair Transfer Bed/Chair Transfer: 3: Chair or W/C > Bed: Mod A (lift or lower assist), 3: Bed > Chair or W/C: Mod A (lift or lower assist), 4: Supine > Sit: Min A (steadying Pt. > 75%/lift 1 leg), 4: Sit > Supine: Min A (steadying pt. > 75%/lift 1 leg)  FIM - Locomotion: Wheelchair Distance: 50  Locomotion: Wheelchair: 2: Travels 50 - 149 ft with moderate assistance (Pt: 50 - 74%) FIM - Locomotion: Ambulation Locomotion: Ambulation Assistive Devices: Other (comment) (3 musketeers) Ambulation/Gait Assistance: 1: +2 Total assist Locomotion: Ambulation: 1: Two helpers  Comprehension Comprehension Mode: Auditory Comprehension:  4-Understands basic 75 - 89% of the time/requires cueing 10 - 24% of the time  Expression Expression Mode: Verbal Expression: 5-Expresses basic 90% of the time/requires cueing < 10% of the time.  Social Interaction Social Interaction: 5-Interacts appropriately 90% of the time - Needs monitoring or encouragement for participation or interaction.  Problem Solving Problem Solving: 3-Solves basic 50 - 74% of the time/requires cueing 25 - 49% of the time  Memory Memory: 2-Recognizes or recalls 25 - 49% of the time/requires cueing 51 - 75% of the time  Medical Problem List and Plan: 1. Functional deficits secondary to multitrauma after a fall with left distal humerus fracture, pelvic fx's status post ORIF humerus fx. Nonweightbearing left upper extremity and splint was discontinued 11/26/2015. Left C1 pelvic ring fracture weightbearing as tolerated. 2. DVT Prophylaxis/Anticoagulation: Subcutaneous Lovenox. Check vascular study 3. Pain Management: Oxycodone as needed. Monitor with increased mobility 4. Mood/agitation: Xanax 0.5 mg tid.  -began scheduled librium  -seroquel qhs agitation 5. Neuropsych: This patient is not capable of making decisions on his own behalf. 6. Skin/Wound Care: Routine skin checks 7. Fluids/Electrolytes/Nutrition: Strict I&O with follow-up chemistries 8. History of alcohol abuse as well as illicit benzodiazepine use. Monitor for any signs of withdrawal. Discuss with family 9. Hyperlipidemia. Zocor 10. History of gout. Monitor for any signs of flareup/continue allopurinol LOS (Days) 2 A FACE TO FACE EVALUATION WAS PERFORMED  Edward Singleton T 11/27/2014 8:55 AM

## 2014-11-27 NOTE — Progress Notes (Signed)
Occupational Therapy Session Note  Patient Details  Name: NIEGEL ZEHNER MRN: [id number] Date of Birth: 06-24-1951  Today's Date: 11/27/2014 OT Individual Time: 0900-1000 OT Individual Time Calculation (min): 60 min    Short Term Goals: Week 1:  OT Short Term Goal 1 (Week 1): Pt will complete bathing with min A and min cues for sequencing  OT Short Term Goal 2 (Week 1): Pt will complete LB dressing with min A  OT Short Term Goal 3 (Week 1): Pt will demonstrate sustained attention to functional task for 1 min with mod cues OT Short Term Goal 4 (Week 1): Pt will identify 1 cognitive or physical deficit with mod cues  Skilled Therapeutic Interventions/Progress Updates:    Pt resting in bed upon arrival and agreeable to bathing and dressing this morning.  Pt oriented to person and city only.  Pt could not recall name of hospital.  Pt required max verbal cues to maintain NWB status through LUE.  Pt completed bathing and dressing tasks with sit<>stand from w/c at sink.  Pt required steady A for stand pivot transfers bed<>w/c and sit<>stand from w/c at sink.  Focus on activity tolerance, sit<>stand, standing balance, task initiation, sequencing, attention to task, and safety awareness.  Therapy Documentation Precautions:  Precautions Precautions: Fall Required Braces or Orthoses: Other Brace/Splint, Sling Other Brace/Splint: LUE cast Restrictions Weight Bearing Restrictions: Yes RUE Weight Bearing: Weight bearing as tolerated LUE Weight Bearing: Non weight bearing RLE Weight Bearing: Weight bearing as tolerated LLE Weight Bearing: Weight bearing as tolerated Pain: Pain Assessment Pain Assessment: 0-10 Pain Score: 8  Pain Type: Acute pain Pain Location: Back Pain Orientation: Left Pain Descriptors / Indicators: Aching Pain Frequency: Constant Pain Onset: On-going Patients Stated Pain Goal: 3 Pain Intervention(s): RN aware; repositioned  See FIM for current functional  status  Therapy/Group: Individual Therapy  Leroy Libman 11/27/2014, 10:43 AM

## 2014-11-27 NOTE — Progress Notes (Signed)
Orthopedic Tech Progress Note Patient Details:  Edward Singleton May 12, 1951 [id number]  Ortho Devices Type of Ortho Device: Arm sling Ortho Device/Splint Interventions: Ordered   Asia Mellody Memos 11/27/2014, 12:48 PM

## 2014-11-27 NOTE — Progress Notes (Signed)
Physical Therapy Session Note  Patient Details  Name: Edward Singleton MRN: [id number] Date of Birth: Jan 10, 1951  Today's Date: 11/27/2014 PT Individual Time: 1100-1200 PT Individual Time Calculation (min): 60 min   Short Term Goals: Week 1:  PT Short Term Goal 1 (Week 1): Pt will perform bed <> wheelchair transfer with consistent min A and 50% cues for LUE NWB.  PT Short Term Goal 2 (Week 1): Pt will perform bed mobility with supervision and 50% cues for LUE NWB.  PT Short Term Goal 3 (Week 1): Pt will ambulate 30 ft using LRAD with assist of one person.  PT Short Term Goal 4 (Week 1): Pt will negotiate up/down 5 stairs using R rail with assist of one person.   Skilled Therapeutic Interventions/Progress Updates:  1:1. Pt received supine in bed with nurse student present, ready and willing to participate in physical therapy. Session focused on ambulation, functional transfers, bed mobility and formal balance assessment. Pt transported to/from therapy gym for time management. Pt ambulated with R wide-based quad cane and overall minA for 50'x1 and 75'x1 with emphasis on sequencing with AD and endurance with ambulation. Gait deviations evident including overall antalgic gait, decreased stance time on L leg due to pain, B decreased step length and step-to pattern. All bed mobility and functional stand pivot transfers throughout the session were performed at min A level, including bed sit<>spine and rolling on a therapy mat with min verbal cues for maintaining NWB status LUE. Both 5xSTS and TUG tests were performed, pt completing 5xSTS in 21 seconds and the TUG (with quad cane and min guard assist) in 45 seconds placing him at a continued increased risk for falls.  Pt assisted with donning of L UE sling at beginning of session and maintained NWB status of LUE throughout with only occasional min verbal cues, verbalizing understanding of precautions. Pt returned to room and left seated in w/c at end of  session.  Therapy Documentation Precautions:  Precautions Precautions: Fall Required Braces or Orthoses: Other Brace/Splint, Sling Other Brace/Splint: LUE cast Restrictions Weight Bearing Restrictions: Yes RUE Weight Bearing: Weight bearing as tolerated LUE Weight Bearing: Non weight bearing RLE Weight Bearing: Weight bearing as tolerated LLE Weight Bearing: Weight bearing as tolerated Pain: Pain Assessment Pain Assessment: 0-10 Pain Score: 9  Pain Type: Acute pain Pain Location: Hip Pain Orientation: Left;Right Pain Descriptors / Indicators: Aching Pain Frequency: Constant Pain Onset: On-going Patients Stated Pain Goal: 3 Pain Intervention(s): RN made aware;Distraction;Rest (Pt reported receiving pain meds prior to start of session) Multiple Pain Sites: Yes 2nd Pain Site Pain Score: 9 Pain Type: Acute pain Pain Location: Arm Pain Orientation: Left Pain Descriptors / Indicators: Aching Pain Onset: On-going Pain Intervention(s): Distraction;Rest;RN made aware Balance: Balance Balance Assessed: Yes Standardized Balance Assessment Standardized Balance Assessment: Timed Up and Go Test (5x STS: 21seconds) Timed Up and Go Test TUG: Normal TUG Normal TUG (seconds): 45  See FIM for current functional status  Therapy/Group: Individual Therapy  Carlos Levering 11/27/2014, 12:35 PM

## 2014-11-28 ENCOUNTER — Inpatient Hospital Stay (HOSPITAL_COMMUNITY): Payer: 59

## 2014-11-28 ENCOUNTER — Encounter (HOSPITAL_COMMUNITY): Payer: Self-pay

## 2014-11-28 ENCOUNTER — Inpatient Hospital Stay (HOSPITAL_COMMUNITY): Payer: 59 | Admitting: Speech Pathology

## 2014-11-28 LAB — CBC
HCT: 25.9 % — ABNORMAL LOW (ref 39.0–52.0)
Hemoglobin: 8.6 g/dL — ABNORMAL LOW (ref 13.0–17.0)
MCH: 34.3 pg — ABNORMAL HIGH (ref 26.0–34.0)
MCHC: 33.2 g/dL (ref 30.0–36.0)
MCV: 103.2 fL — ABNORMAL HIGH (ref 78.0–100.0)
PLATELETS: 731 10*3/uL — AB (ref 150–400)
RBC: 2.51 MIL/uL — AB (ref 4.22–5.81)
RDW: 16.9 % — AB (ref 11.5–15.5)
WBC: 23.1 10*3/uL — AB (ref 4.0–10.5)

## 2014-11-28 NOTE — Progress Notes (Signed)
Physical Therapy Session Note  Patient Details  Name: Edward Singleton MRN: [id number] Date of Birth: [birth year]  Today's Date: 11/28/2014 PT Individual Time: 1330-1430 PT Individual Time Calculation (min): 60 min   Short Term Goals: Week 1:  PT Short Term Goal 1 (Week 1): Pt will perform bed <> wheelchair transfer with consistent min A and 50% cues for LUE NWB.  PT Short Term Goal 2 (Week 1): Pt will perform bed mobility with supervision and 50% cues for LUE NWB.  PT Short Term Goal 3 (Week 1): Pt will ambulate 30 ft using LRAD with assist of one person.  PT Short Term Goal 4 (Week 1): Pt will negotiate up/down 5 stairs using R rail with assist of one person.   Skilled Therapeutic Interventions/Progress Updates:  1:1. Pt received seated in w/c at nurses station. Session focused on functional transfers and ambulation, seated balance, and supine exercises. Pt performed all bed mobility and stand pivot transfers at overall supervision level and wide based quad cane. Pt was transported to gym in w/c for time management. Pt participated in seated balance activities x16min with feet supported, reaching outside of BOS with R UE and an emphasis on B anterior lateral leans. Pt reported increased lower back pain and transferred to supine with knees bent and ice applied to low back. Pt engaged in B LE strengthening exercises in supine, 2 sets x12 of heel slides, short arch quads and straight leg raise, requiring min A to perform straight leg raise on L LE.  Pt ambulated 100'x1 with LBQC and overall min A. Pt didn't require any cues to maintain NWB status of L UE with the sling donned throughout session. Pt was left seated in w/c, quick release belt in place, all needs in reach.  Therapy Documentation Precautions:  Precautions Precautions: Fall Required Braces or Orthoses: Other Brace/Splint, Sling Other Brace/Splint: LUE cast Restrictions Weight Bearing Restrictions: Yes RUE Weight Bearing: Weight  bearing as tolerated LUE Weight Bearing: Non weight bearing RLE Weight Bearing: Weight bearing as tolerated LLE Weight Bearing: Weight bearing as tolerated  See FIM for current functional status  Therapy/Group: Individual Therapy  Carlos Levering 11/28/2014, 4:11 PM

## 2014-11-28 NOTE — Progress Notes (Signed)
[place] PHYSICAL MEDICINE & REHABILITATION     PROGRESS NOTE    Subjective/Complaints: Slept well again. Some dysuria noted.  A  review of systems has been performed and if not noted above is otherwise negative.   Objective: Vital Signs: Blood pressure 117/65, pulse 86, temperature 98.7 F (37.1 C), temperature source Oral, resp. rate 18, SpO2 98 %. No results found.  Recent Labs  11/25/14 1550 11/26/14 0732  WBC 15.4* 19.9*  HGB 9.2* 10.0*  HCT 27.0* 30.1*  PLT 510* 558*    Recent Labs  11/25/14 1550 11/26/14 0732  NA  --  131*  K  --  3.7  CL  --  95*  GLUCOSE  --  135*  BUN  --  10  CREATININE 1.32 1.34  CALCIUM  --  8.2*   CBG (last 3)  No results for input(s): GLUCAP in the last 72 hours.  Wt Readings from Last 3 Encounters:  11/17/14 74.844 kg (165 lb)    Physical Exam:  Head: Normocephalic.  Eyes: EOM are normal.  Neck: Normal range of motion. Neck supple. No tracheal deviation present. No thyromegaly present.  Cardiovascular: Normal rate and regular rhythm.  Respiratory: Effort normal and breath sounds normal. No respiratory distress.  GI: Soft. Bowel sounds are normal. He exhibits no distension.  Musculoskeletal:  Left upper extremity in splint and sling  Neurological: He is alert.  Patient is oriented to person but not place and time. He did answer simple yes no questions. Followed simple commands. He did appear somewhat anxious during exam. Decrease insight and awareness. Moves all 4's with some limitation proximally more than distally, left more than right (inconsistent). Stocking glove sensory loss from the knees to the feet. Also sensory loss to pin prick in the hands.  Skin: Skin is warm and dry.  Numerous scabs and scars on limbs, trunk. Incision intact Psychiatric:  anxious   dorsal left arm with dressing in place changes as per orthopedic services  Assessment/Plan: 1. Functional deficits secondary to polytrauma including pelvic  fx's, left distal humerus fx which require 3+ hours per day of interdisciplinary therapy in a comprehensive inpatient rehab setting. Physiatrist is providing close team supervision and 24 hour management of active medical problems listed below. Physiatrist and rehab team continue to assess barriers to discharge/monitor patient progress toward functional and medical goals. FIM: FIM - Bathing Bathing Steps Patient Completed: Right Arm, Abdomen, Front perineal area, Right upper leg, Left upper leg, Buttocks, Chest Bathing: 3: Mod-Patient completes 5-7 81f 10 parts or 50-74%  FIM - Upper Body Dressing/Undressing Upper body dressing/undressing: 5: Supervision: Safety issues/verbal cues FIM - Lower Body Dressing/Undressing Lower body dressing/undressing steps patient completed: Thread/unthread right underwear leg, Pull underwear up/down, Thread/unthread right pants leg, Pull pants up/down, Don/Doff right sock Lower body dressing/undressing: 3: Mod-Patient completed 50-74% of tasks  FIM - Musician Devices: Grab bar or rail for support Toileting: 1: Total-Patient completed zero steps, helper did all 3  FIM - Radio producer Devices: Grab bars Toilet Transfers: 4-From toilet/BSC: Min A (steadying Pt. > 75%), 4-To toilet/BSC: Min A (steadying Pt. > 75%)  FIM - Bed/Chair Transfer Bed/Chair Transfer Assistive Devices: Arm rests, Bed rails Bed/Chair Transfer: 4: Supine > Sit: Min A (steadying Pt. > 75%/lift 1 leg), 4: Sit > Supine: Min A (steadying pt. > 75%/lift 1 leg), 4: Bed > Chair or W/C: Min A (steadying Pt. > 75%), 4: Chair or W/C > Bed: Min A (steadying  Pt. > 75%)  FIM - Locomotion: Wheelchair Distance: 50 Locomotion: Wheelchair: 1: Total Assistance/staff pushes wheelchair (Pt<25%) FIM - Locomotion: Ambulation Locomotion: Ambulation Assistive Devices: Nurse, adult Ambulation/Gait Assistance: 4: Min assist Locomotion: Ambulation: 2: Travels 50  - 149 ft with minimal assistance (Pt.>75%)  Comprehension Comprehension Mode: Auditory Comprehension: 5-Understands basic 90% of the time/requires cueing < 10% of the time  Expression Expression Mode: Verbal Expression: 5-Expresses basic needs/ideas: With no assist  Social Interaction Social Interaction: 5-Interacts appropriately 90% of the time - Needs monitoring or encouragement for participation or interaction.  Problem Solving Problem Solving: 4-Solves basic 75 - 89% of the time/requires cueing 10 - 24% of the time  Memory Memory: 3-Recognizes or recalls 50 - 74% of the time/requires cueing 25 - 49% of the time  Medical Problem List and Plan: 1. Functional deficits secondary to multitrauma after a fall with left distal humerus fracture, pelvic fx's status post ORIF humerus fx. Nonweightbearing left upper extremity and splint was discontinued 11/26/2015. Left C1 pelvic ring fracture weightbearing as tolerated. 2. DVT Prophylaxis/Anticoagulation: Subcutaneous Lovenox. Check vascular study 3. Pain Management: Oxycodone as needed. Monitor with increased mobility 4. Mood/agitation: Xanax 0.5 mg tid.  -began scheduled librium  -seroquel qhs agitation 5. Neuropsych: This patient is not capable of making decisions on his own behalf. 6. Skin/Wound Care: Routine skin checks 7. Fluids/Electrolytes/Nutrition: encourage PO 8. History of alcohol abuse as well as illicit benzodiazepine use. Monitor for any signs of withdrawal. Discuss with family 9. Hyperlipidemia. Zocor 10. History of gout. Monitor for any signs of flareup/continue allopurinol 11. Leukocytosis: has been ranging from 15-19k for the last several days.   -he is afebrile  -no clinical signs of infection except for the dysuria---UA neg---continue to follow  -recheck cbc today LOS (Days) 3 A FACE TO FACE EVALUATION WAS PERFORMED  Edward Singleton T 11/28/2014 7:46 AM

## 2014-11-28 NOTE — IPOC Note (Signed)
Overall Plan of Care [facility]) Patient Details Name: Edward Singleton MRN: [id number] DOB: 12-Nov-1950  Admitting Diagnosis: POLY TRAUMA AFTER FALL WITH ETOH  Hospital Problems: Principal Problem:   Multiple stable closed lateral compression fractures of pelvis, Left LC1 pelvic ring fx  Active Problems:   Closed fracture of left distal humerus   Acute blood loss anemia     Functional Problem List: Nursing Behavior, Bowel, Endurance, Medication Management, Pain, Perception, Safety, Skin Integrity  PT Balance, Behavior, Edema, Endurance, Motor, Nutrition, Pain, Safety  OT Balance, Cognition, Endurance, Motor, Sensory, Safety, Perception  SLP Cognition, Perception, Safety  TR         Basic ADL's: OT Eating, Grooming, Bathing, Dressing, Toileting     Advanced  ADL's: OT Simple Meal Preparation     Transfers: PT Bed Mobility, Bed to Chair, Car, Manufacturing systems engineer, Metallurgist: PT Ambulation, Emergency planning/management officer, Stairs     Additional Impairments: OT Fuctional Use of Upper Extremity (NWB LUE)  SLP Social Cognition   Memory, Problem Solving, Awareness, Attention  TR      Anticipated Outcomes Item Anticipated Outcome  Self Feeding supervision  Swallowing      Basic self-care  supervision   Toileting  supervision   Bathroom Transfers supervision  Bowel/Bladder  maange bowel and bladder min assist  Transfers  supervision  Locomotion  supervision household ambulator  Communication     Cognition  Min A  Pain  3 or less  Safety/Judgment  Mod I   Therapy Plan: PT Intensity: Minimum of 1-2 x/day ,45 to 90 minutes PT Frequency: 5 out of 7 days PT Duration Estimated Length of Stay: 14-18 days OT Intensity: Minimum of 1-2 x/day, 45 to 90 minutes OT Frequency: 5 out of 7 days OT Duration/Estimated Length of Stay: 14-18 days SLP Intensity: Minumum of 1-2 x/day, 30 to 90 minutes SLP Frequency: 3 to 5 out of 7 days SLP Duration/Estimated Length of  Stay: 14-18 days       Team Interventions: Nursing Interventions Patient/Family Education, Bowel Management, Disease Management/Prevention, Pain Management, Medication Management, Skin Care/Wound Management  PT interventions Ambulation/gait training, Balance/vestibular training, Cognitive remediation/compensation, Discharge planning, DME/adaptive equipment instruction, Functional mobility training, Pain management, Patient/family education, Stair training, Therapeutic Activities, Therapeutic Exercise, UE/LE Strength taining/ROM, UE/LE Coordination activities, Wheelchair propulsion/positioning  OT Interventions Cognitive remediation/compensation, Training and development officer, Academic librarian, Discharge planning, DME/adaptive equipment instruction, Neuromuscular re-education, Psychosocial support, Patient/family education, Self Care/advanced ADL retraining, Pain management, Functional mobility training, Therapeutic Activities, Splinting/orthotics, Therapeutic Exercise, UE/LE Strength taining/ROM, Visual/perceptual remediation/compensation, UE/LE Coordination activities, Wheelchair propulsion/positioning  SLP Interventions Cognitive remediation/compensation, English as a second language teacher, Functional tasks, Therapeutic Activities, Patient/family education, Environmental controls, Internal/external aids  TR Interventions    SW/CM Interventions Discharge Planning, Barrister's clerk, Patient/Family Education    Team Discharge Planning: Destination: PT-Home ,OT- Home , SLP-Skilled Nursing Facility (SNF) (versus home) Projected Follow-up: PT-Home health PT, 24 hour supervision/assistance, OT-  Home health OT, SLP-Home Health SLP, Outpatient SLP, 24 hour supervision/assistance, Skilled Nursing facility Projected Equipment Needs: PT-To be determined, OT- To be determined, SLP-None recommended by SLP Equipment Details: PT- , OT-  Patient/family involved in discharge planning: PT- Patient,  OT-Patient unable/family  or caregiver not available, SLP-Patient unable/family or caregive not available  MD ELOS: 12-18d Medical Rehab Prognosis:  Good Assessment: 64 y.o. right handed male admitted 11/17/2014 after a fall while at home. Denied any chest pain syncope related to fall. Patient does have a history of alcohol abuse as well as illicit benzodiazepine  use. Independent PTA living alone and still working. Cranial CT scan no acute intracranial abnormalities  and a small right frontal scalp hematoma. Alcohol levels were negative on admission. CT cervical spine negative. x-rays and imaging showed mildly displaced fractures of left obturator ring fx involving the left inferior pubic ramus and puboacetabular junction as well as displaced left supracondylar humerus fracture. Orthopedic service was consulted and underwent ORIF of left distal humerus fracture 11/18/2014 per Dr. Marcelino Scot. Nonweightbearing left upper extremity. Weightbearing as tolerated lower extremities conservative care of pelvic ring fracture  Now requiring 24/7 Rehab RN,MD, as well as CIR level PT, OT and SLP.  Treatment team will focus on ADLs and mobility, Cognition will also be evaluated given his scalp hematoma post fall with goals set at Supervision   See Team Conference Notes for weekly updates to the plan of care

## 2014-11-28 NOTE — Progress Notes (Signed)
Speech Language Pathology Daily Session Note  Patient Details  Name: Edward Singleton MRN: [id number] Date of Birth: 1950-12-29  Today's Date: 11/28/2014 SLP Individual Time: 0900-1000 SLP Individual Time Calculation (min): 60 min  Short Term Goals: Week 1: SLP Short Term Goal 1 (Week 1): Patient will orient to time and situation with Mod A multimodal cueing. SLP Short Term Goal 2 (Week 1): Patient will demonstrate functional problem solving for basic daily situations with Mod A multimodal cueing. SLP Short Term Goal 3 (Week 1): Patient will use external memory aids to recall new, daily information with Mod  A multimodal cueing. SLP Short Term Goal 4 (Week 1): Patient will maintain selective attention in a mildy distracting environment with Mod A multimodal cueing for ~10 minutes. SLP Short Term Goal 5 (Week 1): Patient will identify 2 physical and 2 cognitive imapirments with Mod A multimodal cues. SLP Short Term Goal 6 (Week 1): Patient will utilize callbell to request assistance with Mod A multimodal cues.  Skilled Therapeutic Interventions: Skilled treatment session focused on cognitive goals. Upon arrival, patient was awake while supine in bed and agreeable to participate in treatment session. SLP facilitated session by providing Mod A multimodal cues for recall of rules to a previously learned task and Min A multimodal cues with functional problem solving with mildly complex card task. However, patient was oriented X 4 with Mod I and recalled events from previous therapy session with Mod I. Patient also demonstrated increased emergent awareness in regards to recall of NWB status with his LUE and was able to self-monitor and correct during functional tasks. SLP also facilitated session by educating patient in regards to his current medications. Patient was able to recall 3 of 8 of his medications and their functions because he was previously taking these medications at home. SLP also initiated  use of an external aid to increase recall/carryover of information. Patient left in bed with bed alarm on and all needs within reach. Continue with current plan of care.    FIM:  Comprehension Comprehension Mode: Auditory Comprehension: 5-Understands basic 90% of the time/requires cueing < 10% of the time Expression Expression Mode: Verbal Expression: 5-Expresses basic needs/ideas: With no assist Social Interaction Social Interaction: 5-Interacts appropriately 90% of the time - Needs monitoring or encouragement for participation or interaction. Problem Solving Problem Solving: 4-Solves basic 75 - 89% of the time/requires cueing 10 - 24% of the time Memory Memory: 3-Recognizes or recalls 50 - 74% of the time/requires cueing 25 - 49% of the time  Pain Pain Assessment Pain Assessment: No/denies pain   Therapy/Group: Individual Therapy  Edward Singleton 11/28/2014, 4:01 PM

## 2014-11-28 NOTE — Plan of Care (Signed)
Problem: RH Balance Goal: LTG Patient will maintain dynamic standing balance (PT) LTG: Patient will maintain dynamic standing balance with assistance during mobility activities (PT)  Upgraded due to improved safety awareness, balance and ability to maintain NWB status of LUE.  Problem: RH Bed Mobility Goal: LTG Patient will perform bed mobility with assist (PT) LTG: Patient will perform bed mobility with assistance, with/without cues (PT).  Upgraded due to improved safety awareness, balance and ability to maintain NWB status of LUE.  Problem: RH Bed to Chair Transfers Goal: LTG Patient will perform bed/chair transfers w/assist (PT) LTG: Patient will perform bed/chair transfers with assistance, with/without cues (PT).  Upgraded due to improved safety awareness, balance and ability to maintain NWB status of LUE.

## 2014-11-28 NOTE — Progress Notes (Signed)
Orthopaedic Trauma Service Progress Note  Subjective  Doing well Visiting with family   Noted order for nicotine patch after visiting with patient   ROS Sore left elbow  Objective   BP 118/71 mmHg  Pulse 87  Temp(Src) 98.7 F (37.1 C) (Oral)  Resp 17  SpO2 100%  Intake/Output      02/18 0701 - 02/19 0700 02/19 0701 - 02/20 0700   P.O. 1080 600   Total Intake 1080 600   Urine 560 200   Total Output 560 200   Net +520 +400        Urine Occurrence 2 x 1 x      Exam  [id number], appears comfortable, very pleasant Pelvis: stable, no instability  Ext:       Left Upper Extremity   Wounds stable   Traumatic wound dorsolateral forearm is stable, this is the wound that has been weeping some serous drainage             Surgical incision is stable             No drainage from op wound               Swelling vastly improved              Ext warm             + Radial pulse             R/U/M motor and sensory functions intact             AIN/PIN motor intact   Assessment and Plan    64 y/o male s/p fall with L distal humerus fracture  1. Fall  2. L distal humerus fracture s/p ORIF             NWB L upper extremity              new dressing applied today, soft dressing, dressing change as needed             AROM/PROM L elbow               Active and passive motion L forearm, wrist and hand             Ice and elevate             PT/OT             Sling for comfort only when ambulating o/w want sling off     DC all nicotine products  Nicotine slows bone and wound healing               3. Left LC1 pelvic ring fracture            WBAT B                 4. Dispo             continue per inpatient rehab     Jari Pigg, PA-C [facility] [phone] (P) [phone] (O) 11/28/2014 5:25 PM

## 2014-11-28 NOTE — Progress Notes (Signed)
Occupational Therapy Session Note  Patient Details  Name: Edward Singleton MRN: [id number] Date of Birth: 1951-04-22  Today's Date: 11/28/2014 OT Individual Time: 0800-0900 OT Individual Time Calculation (min): 60 min    Short Term Goals: Week 1:  OT Short Term Goal 1 (Week 1): Pt will complete bathing with min A and min cues for sequencing  OT Short Term Goal 2 (Week 1): Pt will complete LB dressing with min A  OT Short Term Goal 3 (Week 1): Pt will demonstrate sustained attention to functional task for 1 min with mod cues OT Short Term Goal 4 (Week 1): Pt will identify 1 cognitive or physical deficit with mod cues  Skilled Therapeutic Interventions/Progress Updates:    Pt engaged in BADL retraining including bathing at sink and dressing with sit<>stand from w/c.  Pt continues to require max verbal cues for LUE NWB during transitional movements.  Pt continues to require assistance with bathing LLE/feet and threading pants over LLE.  Pt performs sit<>stand and maintains standing balance with steady A.  Pt oriented to place and date with visual cues.  Focus on activity tolerance, sit<>stand, standing balance, functional transfers, and safety awareness.  Therapy Documentation Precautions:  Precautions Precautions: Fall Required Braces or Orthoses: Other Brace/Splint, Sling Other Brace/Splint: LUE cast Restrictions Weight Bearing Restrictions: Yes RUE Weight Bearing: Weight bearing as tolerated LUE Weight Bearing: Non weight bearing RLE Weight Bearing: Weight bearing as tolerated LLE Weight Bearing: Weight bearing as tolerated General:   Vital Signs:   Pain: Pain Assessment Pain Assessment: No/denies pain  See FIM for current functional status  Therapy/Group: Individual Therapy  Leroy Libman 11/28/2014, 11:50 AM

## 2014-11-28 NOTE — Progress Notes (Addendum)
INITIAL NUTRITION ASSESSMENT  Pt meets criteria for NON-SEVERE (MODERATE) MALNUTRITION in the context of chronic illness as evidenced by moderate fat and muscle mass loss.  DOCUMENTATION CODES Per approved criteria  -Non-severe (moderate) malnutrition in the context of chronic illness   INTERVENTION: Continue Ensure Complete po BID, each supplement provides 350 kcal and 13 grams of protein.  Encourage adequate PO intake.  Recommend obtaining new weight to fully assess weight trends.  NUTRITION DIAGNOSIS: Increased nutrient needs related to therapy as evidenced by estimated nutrition needs.   Goal: Pt to meet >/= 90% of their estimated nutrition needs   Monitor:  PO intake, weight trends, labs, I/O's  Reason for Assessment: MST  64 y.o. male  Admitting Dx: Multiple stable closed lateral compression fractures of pelvis  ASSESSMENT: Pt with a fall while at home. Patient does have a history of alcohol abuse as well as illicit benzodiazepine use. x-rays and imaging showed mildly displaced fractures of left obturator ring fx involving the left inferior pubic ramus and puboacetabular junction as well as displaced left supracondylar humerus fracture.  Pt reports having a good appetite currently and PTA at home. Meal completion has been varied from 15-100%. Pt reports he is not use to eating 3 meals a day as he usually eats 2 meals at home. Pt reports reports currently he has been eating 100% of breakfast and lunch, however not does eat much at dinner. Pt reports usual body weight of 130 lbs. Recommend obtaining new weight to assess weight trends. Pt was encouraged to eat his food at meals. Will continue with Ensure as pt has been consuming them.  Nutrition Focused Physical Exam:  Subcutaneous Fat:  Orbital Region: N/A Upper Arm Region: Moderate depletion Thoracic and Lumbar Region: WNL  Muscle:  Temple Region: Moderate depletion Clavicle Bone Region: WNL Clavicle and Acromion  Bone Region: WNL Scapular Bone Region: N/A Dorsal Hand: N/A Patellar Region: Moderate depletion Anterior Thigh Region: Moderate depletion Posterior Calf Region: N/A  Edema: non-pitting LUE   Labs and medications reviewed.  Height: Ht Readings from Last 1 Encounters:  11/17/14 5\' 7"  (1.702 m)    Weight: Wt Readings from Last 1 Encounters:  11/17/14 165 lb (74.844 kg)    Ideal Body Weight: 148 lbs  % Ideal Body Weight: 111%  Wt Readings from Last 10 Encounters:  11/17/14 165 lb (74.844 kg)    Usual Body Weight: 130 lbs (per pt report)  % Usual Body Weight: 127%  BMI: Body Mass Index: 25.88 kg/(m^2)  Estimated Nutritional Needs: Kcal: 1850-2050 Protein: 85-100 grams Fluid: 1.85 - 2 L/day  Skin: Incision on left , non-pitting LUE edema  Diet Order: Diet regular  EDUCATION NEEDS: -No education needs identified at this time   Intake/Output Summary (Last 24 hours) at 11/28/14 1018 Last data filed at 11/28/14 1000  Gross per 24 hour  Intake   1200 ml  Output    560 ml  Net    640 ml    Last BM: 2/17  Labs:   Recent Labs Lab 11/23/14 0240 11/24/14 0158 11/25/14 1550 11/26/14 0732  NA 136  --   --  131*  K 4.0  --   --  3.7  CL 101  --   --  95*  CO2 27  --   --  27  BUN 14  --   --  10  CREATININE 1.58* 1.31 1.32 1.34  CALCIUM 8.1*  --   --  8.2*  GLUCOSE 95  --   --  135*    CBG (last 3)  No results for input(s): GLUCAP in the last 72 hours.  Scheduled Meds: . allopurinol  150 mg Oral Daily  . ALPRAZolam  0.5 mg Oral TID  . chlordiazePOXIDE  10 mg Oral TID  . docusate sodium  100 mg Oral BID  . enoxaparin (LOVENOX) injection  40 mg Subcutaneous Q24H  . feeding supplement (ENSURE COMPLETE)  237 mL Oral BID BM  . folic acid  1 mg Oral Daily  . multivitamin with minerals  1 tablet Oral Daily  . nicotine  21 mg Transdermal Daily  . polyethylene glycol  17 g Oral Daily  . simvastatin  40 mg Oral Q2000  . thiamine  100 mg Oral Daily     Continuous Infusions:   Past Medical History  Diagnosis Date  . Hypertension   . Hypercholesteremia   . Gout     Past Surgical History  Procedure Laterality Date  . Nephrectomy    . Orif humerus fracture Left 11/18/2014    Procedure: OPEN REDUCTION INTERNAL FIXATION (ORIF) DISTAL HUMERUS FRACTURE;  Surgeon: Rozanna Box, MD;  Location: [facility];  Service: Orthopedics;  Laterality: Left;    Kallie Locks, MS, RD, LDN Pager # [phone] After hours/ weekend pager # [phone]

## 2014-11-29 ENCOUNTER — Inpatient Hospital Stay (HOSPITAL_COMMUNITY): Payer: 59 | Admitting: Speech Pathology

## 2014-11-29 ENCOUNTER — Inpatient Hospital Stay (HOSPITAL_COMMUNITY): Payer: Self-pay | Admitting: Physical Therapy

## 2014-11-29 ENCOUNTER — Encounter (HOSPITAL_COMMUNITY): Payer: Self-pay | Admitting: *Deleted

## 2014-11-29 ENCOUNTER — Inpatient Hospital Stay (HOSPITAL_COMMUNITY): Payer: Self-pay | Admitting: Speech Pathology

## 2014-11-29 LAB — URINALYSIS, ROUTINE W REFLEX MICROSCOPIC
Bilirubin Urine: NEGATIVE
GLUCOSE, UA: NEGATIVE mg/dL
HGB URINE DIPSTICK: NEGATIVE
Ketones, ur: NEGATIVE mg/dL
Nitrite: NEGATIVE
Protein, ur: NEGATIVE mg/dL
Specific Gravity, Urine: 1.011 (ref 1.005–1.030)
Urobilinogen, UA: 1 mg/dL (ref 0.0–1.0)
pH: 7 (ref 5.0–8.0)

## 2014-11-29 LAB — CBC
HCT: 25.7 % — ABNORMAL LOW (ref 39.0–52.0)
Hemoglobin: 8.4 g/dL — ABNORMAL LOW (ref 13.0–17.0)
MCH: 34.1 pg — ABNORMAL HIGH (ref 26.0–34.0)
MCHC: 32.7 g/dL (ref 30.0–36.0)
MCV: 104.5 fL — AB (ref 78.0–100.0)
Platelets: 742 10*3/uL — ABNORMAL HIGH (ref 150–400)
RBC: 2.46 MIL/uL — ABNORMAL LOW (ref 4.22–5.81)
RDW: 17.1 % — AB (ref 11.5–15.5)
WBC: 23.3 10*3/uL — AB (ref 4.0–10.5)

## 2014-11-29 LAB — URINE MICROSCOPIC-ADD ON: URINE-OTHER: NONE SEEN

## 2014-11-29 NOTE — Progress Notes (Signed)
Speech Language Pathology Daily Session Note  Patient Details  Name: Edward Singleton MRN: [id number] Date of Birth: 1951/04/03  Today's Date: 11/29/2014 SLP Individual Time: [id number] SLP Individual Time Calculation (min): 60 min  Short Term Goals: Week 1: SLP Short Term Goal 1 (Week 1): Patient will orient to time and situation with Mod A multimodal cueing. SLP Short Term Goal 2 (Week 1): Patient will demonstrate functional problem solving for basic daily situations with Mod A multimodal cueing. SLP Short Term Goal 3 (Week 1): Patient will use external memory aids to recall new, daily information with Mod  A multimodal cueing. SLP Short Term Goal 4 (Week 1): Patient will maintain selective attention in a mildy distracting environment with Mod A multimodal cueing for ~10 minutes. SLP Short Term Goal 5 (Week 1): Patient will identify 2 physical and 2 cognitive imapirments with Mod A multimodal cues. SLP Short Term Goal 6 (Week 1): Patient will utilize callbell to request assistance with Mod A multimodal cues.  Skilled Therapeutic Interventions: Skilled treatment session focused on cognitive goals. Upon arrival, patient was supine in bed and agreeable to participate in treatment session. SLP facilitated session by transferring the patient to the wheelchair to maximize alertness and arousal. Patient independently requested to use the sling for his LUE in order to maintain weight bearing precautions.  However, patient verbalized pain and discomfort in his lower back while sitting upright in the wheelchair which was impacting his ability to sustain attention to functional tasks. Therefore, clinician transferred patient back to bed to facilitate increased attention to functional tasks.  Patient required Min A multimodal cues for functional problem solving during  a mildly complex, new learning task of organizing a 4 time a day pill box. Patient also required Min A question cues to recall events from  previous therapy sessions and to decrease repetition of previously reported information.  Patient left in bed with bed alarm on and all needs within reach. Continue with current plan of care.    FIM:  Comprehension Comprehension Mode: Auditory Comprehension: 4-Understands basic 75 - 89% of the time/requires cueing 10 - 24% of the time Expression Expression Mode: Verbal Expression: 5-Expresses basic needs/ideas: With no assist Social Interaction Social Interaction: 5-Interacts appropriately 90% of the time - Needs monitoring or encouragement for participation or interaction. Problem Solving Problem Solving: 4-Solves basic 75 - 89% of the time/requires cueing 10 - 24% of the time Memory Memory: 3-Recognizes or recalls 50 - 74% of the time/requires cueing 25 - 49% of the time FIM - Eating Eating Activity: 6: More than reasonable amount of time  Pain Pain Assessment Pain Score: 8  Pain Type: Acute pain Pain Location: Flank Pain Orientation: Left Pain Frequency: Constant Pain Onset: On-going Patients Stated Pain Goal: 5 Pain Intervention(s): Medication (See eMAR)  Therapy/Group: Individual Therapy  Edward Singleton 11/29/2014, 12:13 PM

## 2014-11-29 NOTE — Progress Notes (Signed)
[place] PHYSICAL MEDICINE & REHABILITATION     PROGRESS NOTE    Subjective/Complaints: Feeling much better. Having some dysuria still. .  A  review of systems has been performed and if not noted above is otherwise negative.   Objective: Vital Signs: Blood pressure 100/65, pulse 85, temperature 98.7 F (37.1 C), temperature source Oral, resp. rate 17, SpO2 98 %. No results found.  Recent Labs  11/28/14 0924  WBC 23.1*  HGB 8.6*  HCT 25.9*  PLT 731*   No results for input(s): NA, K, CL, GLUCOSE, BUN, CREATININE, CALCIUM in the last 72 hours.  Invalid input(s): CO CBG (last 3)  No results for input(s): GLUCAP in the last 72 hours.  Wt Readings from Last 3 Encounters:  11/17/14 74.844 kg (165 lb)    Physical Exam:  Head: Normocephalic.  Eyes: EOM are normal.  Neck: Normal range of motion. Neck supple. No tracheal deviation present. No thyromegaly present.  Cardiovascular: Normal rate and regular rhythm.  Respiratory: Effort normal and breath sounds normal. No respiratory distress.  GI: Soft. Bowel sounds are normal. He exhibits no distension.  Musculoskeletal:  Left upper extremity in splint and sling  Neurological: He is alert.  Patient is oriented to person but not place and time. He did answer simple yes no questions. Followed simple commands. He did appear somewhat anxious during exam. Decrease insight and awareness. Moves all 4's with some limitation proximally more than distally, left more than right (inconsistent). Stocking glove sensory loss from the knees to the feet. Also sensory loss to pin prick in the hands.  Skin: Skin is warm and dry.  Numerous scabs and scars on limbs, trunk. Incision intact Psychiatric:  anxious   dorsal left arm with dressing in place changes as per orthopedic services  Assessment/Plan: 1. Functional deficits secondary to polytrauma including pelvic fx's, left distal humerus fx which require 3+ hours per day of  interdisciplinary therapy in a comprehensive inpatient rehab setting. Physiatrist is providing close team supervision and 24 hour management of active medical problems listed below. Physiatrist and rehab team continue to assess barriers to discharge/monitor patient progress toward functional and medical goals. FIM: FIM - Bathing Bathing Steps Patient Completed: Right Arm, Abdomen, Front perineal area, Right upper leg, Left upper leg, Buttocks, Chest, Right lower leg (including foot) Bathing: 4: Min-Patient completes 8-9 52f 10 parts or 75+ percent  FIM - Upper Body Dressing/Undressing Upper body dressing/undressing steps patient completed: Thread/unthread right sleeve of pullover shirt/dresss, Put head through opening of pull over shirt/dress, Thread/unthread left sleeve of pullover shirt/dress, Pull shirt over trunk Upper body dressing/undressing: 5: Supervision: Safety issues/verbal cues FIM - Lower Body Dressing/Undressing Lower body dressing/undressing steps patient completed: Thread/unthread right underwear leg, Pull underwear up/down, Thread/unthread right pants leg, Pull pants up/down, Don/Doff right sock Lower body dressing/undressing: 3: Mod-Patient completed 50-74% of tasks  FIM - Musician Devices: Grab bar or rail for support Toileting: 1: Total-Patient completed zero steps, helper did all 3  FIM - Radio producer Devices: Grab bars Toilet Transfers: 4-From toilet/BSC: Min A (steadying Pt. > 75%), 4-To toilet/BSC: Min A (steadying Pt. > 75%)  FIM - Bed/Chair Transfer Bed/Chair Transfer Assistive Devices: Arm rests Bed/Chair Transfer: 5: Sit > Supine: Supervision (verbal cues/safety issues), 5: Supine > Sit: Supervision (verbal cues/safety issues), 5: Bed > Chair or W/C: Supervision (verbal cues/safety issues), 5: Chair or W/C > Bed: Supervision (verbal cues/safety issues)  FIM - Locomotion: Wheelchair Distance: 50 Locomotion:  Wheelchair: 1: Total Assistance/staff pushes wheelchair (Pt<25%) FIM - Locomotion: Ambulation Locomotion: Ambulation Assistive Devices: Nurse, adult ([facility] then [employer] then [place]) Ambulation/Gait Assistance: 4: Min assist Locomotion: Ambulation: 2: Travels 50 - 149 ft with minimal assistance (Pt.>75%)  Comprehension Comprehension Mode: Auditory Comprehension: 5-Understands basic 90% of the time/requires cueing < 10% of the time  Expression Expression Mode: Verbal Expression: 5-Expresses basic needs/ideas: With no assist  Social Interaction Social Interaction: 5-Interacts appropriately 90% of the time - Needs monitoring or encouragement for participation or interaction.  Problem Solving Problem Solving: 4-Solves basic 75 - 89% of the time/requires cueing 10 - 24% of the time  Memory Memory: 3-Recognizes or recalls 50 - 74% of the time/requires cueing 25 - 49% of the time  Medical Problem List and Plan: 1. Functional deficits secondary to multitrauma after a fall with left distal humerus fracture, pelvic fx's status post ORIF humerus fx. Nonweightbearing left upper extremity and splint was discontinued 11/26/2015. Left C1 pelvic ring fracture weightbearing as tolerated. 2. DVT Prophylaxis/Anticoagulation: Subcutaneous Lovenox. Check vascular study 3. Pain Management: Oxycodone as needed. Monitor with increased mobility 4. Mood/agitation: Xanax 0.5 mg tid.  -began scheduled librium  -seroquel qhs agitation 5. Neuropsych: This patient is not capable of making decisions on his own behalf. 6. Skin/Wound Care: Routine skin checks 7. Fluids/Electrolytes/Nutrition: encourage PO 8. History of alcohol abuse as well as illicit benzodiazepine use. Monitor for any signs of withdrawal. Discuss with family 9. Hyperlipidemia. Zocor 10. History of gout. Monitor for any signs of flareup/continue allopurinol 11. Leukocytosis: up to 23k yesterday.   -remains is afebrile  -no clinical signs of infection  except for the dysuria---UA neg---no culture done---need to do this  -dopplers negative  -recheck cbc today LOS (Days) 4 A FACE TO FACE EVALUATION WAS PERFORMED  SWARTZ,ZACHARY T 11/29/2014 8:58 AM

## 2014-11-29 NOTE — Progress Notes (Signed)
Physical Therapy Session Note  Patient Details  Name: Edward Singleton MRN: [id number] Date of Birth: [birth year]  Today's Date: 11/29/2014 PT Individual Time: 0800-0900 PT Individual Time Calculation (min): 60 min   Short Term Goals: Week 1:  PT Short Term Goal 1 (Week 1): Pt will perform bed <> wheelchair transfer with consistent min A and 50% cues for LUE NWB.  PT Short Term Goal 2 (Week 1): Pt will perform bed mobility with supervision and 50% cues for LUE NWB.  PT Short Term Goal 3 (Week 1): Pt will ambulate 30 ft using LRAD with assist of one person.  PT Short Term Goal 4 (Week 1): Pt will negotiate up/down 5 stairs using R rail with assist of one person.   Skilled Therapeutic Interventions/Progress Updates:    Pt limited in mobility secondary to pain guarding and compensating affecting movement strategies. Pt with improved pain with AAROM activities so likely motor control stability deficits rather than localized mobility deficits contributing to pain. Pt demonstrates progress requiring decreased assist with gait with LRAD in addition to improved weight shift to L in gait without increasing pain  Therapy Documentation Precautions:  Precautions Precautions: Fall Required Braces or Orthoses: Other Brace/Splint, Sling Other Brace/Splint: LUE cast Restrictions Weight Bearing Restrictions: Yes RUE Weight Bearing: Weight bearing as tolerated LUE Weight Bearing: Non weight bearing RLE Weight Bearing: Weight bearing as tolerated LLE Weight Bearing: Weight bearing as tolerated Pain: Pain Assessment Pain Assessment: 0-10 Pain Score: 8  Pain Type: Acute pain Pain Location: Flank Pain Orientation: Left Pain Intervention(s):  (graded activity, manual, positioning) Mobility:  SBA with cues for ventilation for all transfers Locomotion : Ambulation Ambulation/Gait Assistance: 4: Min assist with cues for posture, ventilation, and weight bearing, gait trials x4. Stairs with cues for  sequencing and weight shift Min A. Other Treatments:  Pt educated on positioning and ventilation for pain relief. Marching, hip ER AAROM, LAQs AAROM, ankle pumps, hip add AROM, anterior weight shifts 2x10. BLE advancement and weight shifting 2x10 in standing without AD. Pt educated on rehab plan and safety in mobility.  See FIM for current functional status  Therapy/Group: Individual Therapy  Monia Pouch 11/29/2014, 8:13 AM

## 2014-11-30 ENCOUNTER — Inpatient Hospital Stay (HOSPITAL_COMMUNITY): Payer: 59 | Admitting: *Deleted

## 2014-11-30 ENCOUNTER — Inpatient Hospital Stay (HOSPITAL_COMMUNITY): Payer: Self-pay | Admitting: Physical Therapy

## 2014-11-30 DIAGNOSIS — D72829 Elevated white blood cell count, unspecified: Secondary | ICD-10-CM

## 2014-11-30 LAB — CBC WITH DIFFERENTIAL/PLATELET
BASOS ABS: 0 10*3/uL (ref 0.0–0.1)
Basophils Relative: 0 % (ref 0–1)
EOS PCT: 2 % (ref 0–5)
Eosinophils Absolute: 0.4 10*3/uL (ref 0.0–0.7)
HEMATOCRIT: 27.8 % — AB (ref 39.0–52.0)
Hemoglobin: 9.1 g/dL — ABNORMAL LOW (ref 13.0–17.0)
LYMPHS ABS: 1.7 10*3/uL (ref 0.7–4.0)
Lymphocytes Relative: 9 % — ABNORMAL LOW (ref 12–46)
MCH: 34.1 pg — ABNORMAL HIGH (ref 26.0–34.0)
MCHC: 32.7 g/dL (ref 30.0–36.0)
MCV: 104.1 fL — ABNORMAL HIGH (ref 78.0–100.0)
MONOS PCT: 14 % — AB (ref 3–12)
Monocytes Absolute: 2.9 10*3/uL — ABNORMAL HIGH (ref 0.1–1.0)
NEUTROS ABS: 15.3 10*3/uL — AB (ref 1.7–7.7)
Neutrophils Relative %: 75 % (ref 43–77)
PLATELETS: 838 10*3/uL — AB (ref 150–400)
RBC: 2.67 MIL/uL — ABNORMAL LOW (ref 4.22–5.81)
RDW: 16.9 % — AB (ref 11.5–15.5)
WBC: 20.3 10*3/uL — ABNORMAL HIGH (ref 4.0–10.5)

## 2014-11-30 MED ORDER — CEPHALEXIN 250 MG PO CAPS
250.0000 mg | ORAL_CAPSULE | Freq: Four times a day (QID) | ORAL | Status: DC
  Administered 2014-11-30 – 2014-12-01 (×4): 250 mg via ORAL
  Filled 2014-11-30 (×8): qty 1

## 2014-11-30 NOTE — Progress Notes (Signed)
Occupational Therapy Session Note  Patient Details  Name: JEVANTE MCCAUSLIN MRN: [id number] Date of Birth: 04/01/1951  Today's Date: 11/30/2014 OT Individual Time:  - 0900-1000  (60 (min)    Short Term Goals: Week 1:  OT Short Term Goal 1 (Week 1): Pt will complete bathing with min A and min cues for sequencing  OT Short Term Goal 2 (Week 1): Pt will complete LB dressing with min A  OT Short Term Goal 3 (Week 1): Pt will demonstrate sustained attention to functional task for 1 min with mod cues OT Short Term Goal 4 (Week 1): Pt will identify 1 cognitive or physical deficit with mod cues :     Skilled Therapeutic Interventions/Progress Updates:    Pt engaged in BADL retraining including bathing  and dressing at sink.   Pt. Sitting in wc upon OT arrival.  He completed UB bathing using LH sponge. Instructed pt on AE use during LB bathing and dressing.  Introduced sock aid.  Pt returned demonstration with set up assistance and verbal cues for technique.  Pt performed sit<>stand from w/c. Pt continues to require max verbal cues for LUE NWB during transitional movements. Pt continues to require assistance with bathing LLE/feet and threading pants over LLE. Pt maintains standing balance with SBA.  Applied heat pack to back for  10 minutes. He maintained sustained attention during bathing and dressing for 1 minute without verbal redirecting cues.   Left pt with next therapy session.    Focus on activity tolerance, sit<>stand, standing balance, functional transfers, and safety awareness.   Therapy Documentation Precautions:  Precautions Precautions: Fall Required Braces or Orthoses: Other Brace/Splint, Sling Other Brace/Splint: LUE cast Restrictions Weight Bearing Restrictions: Yes RUE Weight Bearing: Weight bearing as tolerated LUE Weight Bearing: Non weight bearing RLE Weight Bearing: Weight bearing as tolerated LLE Weight Bearing: Weight bearing as tolerated      Pain: Pain  Assessment Pain Assessment: 0-10 Pain Score: 8 Pain Type: Acute pain Pain Location: Flank Pain Orientation: Left ankle, back, arm Pain Descriptors / Indicators: Aching Pain Frequency: Constant Pain Onset: On-going Pain Intervention(s): Medication (See eMAR)  Applied heat pack        See FIM for current functional status  Therapy/Group: Individual Therapy  Lisa Roca 11/30/2014, 7:52 AM

## 2014-11-30 NOTE — Plan of Care (Signed)
Problem: RH BOWEL ELIMINATION Goal: RH STG MANAGE BOWEL WITH ASSISTANCE STG Manage Bowel with Min Assistance.  Outcome: Not Progressing Claims no BM x2 days; continued due meds Goal: RH STG MANAGE BOWEL W/MEDICATION W/ASSISTANCE STG Manage Bowel with Medication with [place].  Outcome: Not Progressing No bm x 2 days continued due meds  Problem: RH SAFETY Goal: RH STG ADHERE TO SAFETY PRECAUTIONS W/ASSISTANCE/DEVICE STG Adhere to Safety Precautions With Mod Assistance/Device.  Outcome: Progressing Sitter at hs  Problem: RH PAIN MANAGEMENT Goal: RH STG PAIN MANAGED AT OR BELOW PT'S PAIN GOAL < 4 on 0-10 pain scale.  Outcome: Not Progressing Pt rated his pain as 9 or 10 all the time even after pain med given

## 2014-11-30 NOTE — Progress Notes (Signed)
Physical Therapy Session Note  Patient Details  Name: Edward Singleton MRN: [id number] Date of Birth: 1951-05-19  Today's Date: 11/30/2014 PT Individual Time: 1000-1100, 1300-1400 PT Individual Time Calculation (min): 60 min , 60 min  Short Term Goals: Week 1:  PT Short Term Goal 1 (Week 1): Pt will perform bed <> wheelchair transfer with consistent min A and 50% cues for LUE NWB.  PT Short Term Goal 2 (Week 1): Pt will perform bed mobility with supervision and 50% cues for LUE NWB.  PT Short Term Goal 3 (Week 1): Pt will ambulate 30 ft using LRAD with assist of one person.  PT Short Term Goal 4 (Week 1): Pt will negotiate up/down 5 stairs using R rail with assist of one person.   Skilled Therapeutic Interventions/Progress Updates:   Pt limited by pain at points in session. Pt benefits most from sidelying positioning and low back manual and neuromotor re-ed for pain relief. Pt with inconsistent pain on numeric scale, frequently repoting 8/10 despite changing facial expressions and reports of increased/decreased pain with activities. Pt sometimes attempting to weight bear through LUE when transitioning in transfers requiring cues. Pt would continue to benefit from skilled PT services to increase functional mobility.  Therapy Documentation Precautions:  Precautions Precautions: Fall Required Braces or Orthoses: Other Brace/Splint, Sling Other Brace/Splint: LUE cast Restrictions Weight Bearing Restrictions: Yes RUE Weight Bearing: Weight bearing as tolerated LUE Weight Bearing: Non weight bearing RLE Weight Bearing: Weight bearing as tolerated LLE Weight Bearing: Weight bearing as tolerated Pain: Pain Assessment Pain Assessment: 0-10 Pain Score: 8  Pain Location: Flank Pain Intervention(s):  (graded activity) Mobility:  SBA with cues for sequencing, weight bearing, and safety in am and pm Locomotion :   50' in am, 70'x2 in pm with Min A and [place] with cues for ventilation,  posture Other Treatments:    Tx 1: Pt educated on neutral sidelying positioning in session. Quadratus lumborum release performed. Sidelying PNF with rhythmic initiation and active AROM 2x10 in midrange. Facilitation of diaphragmatic breathing 2'x3 with cues for pelvic floor activation in last 2 trials. Pt education on pain science, ventilation, safety in mobility, and rehab plan.  Tx 2: Pt performs transfers x8. Heel slides, hip add AROM, glute sets, LAQ AAROM, hip add isometrics, hip ER AROM, bridging 2x10 performed. Pt educated on maintaining NWB status. Supine 5# weight on abd diaphragm resisted breathing, hip add release. Proper sling positioning performed. Pt performs unsupported sitting for active rest. OOB emphasized for post therapy.  See FIM for current functional status  Therapy/Group: Individual Therapy  Monia Pouch 11/30/2014, 10:11 AM

## 2014-11-30 NOTE — Progress Notes (Addendum)
[place] PHYSICAL MEDICINE & REHABILITATION     PROGRESS NOTE    Subjective/Complaints: Slept well. Overall doing much better  A  review of systems has been performed and if not noted above is otherwise negative.   Objective: Vital Signs: Blood pressure 104/67, pulse 77, temperature 99.4 F (37.4 C), temperature source Oral, resp. rate 17, SpO2 96 %. No results found.  Recent Labs  11/28/14 0924 11/29/14 1132  WBC 23.1* 23.3*  HGB 8.6* 8.4*  HCT 25.9* 25.7*  PLT 731* 742*   No results for input(s): NA, K, CL, GLUCOSE, BUN, CREATININE, CALCIUM in the last 72 hours.  Invalid input(s): CO CBG (last 3)  No results for input(s): GLUCAP in the last 72 hours.  Wt Readings from Last 3 Encounters:  11/17/14 74.844 kg (165 lb)    Physical Exam:  Head: Normocephalic.  Eyes: EOM are normal.  Neck: Normal range of motion. Neck supple. No tracheal deviation present. No thyromegaly present.  Cardiovascular: Normal rate and regular rhythm.  Respiratory: Effort normal and breath sounds normal. No respiratory distress.  GI: Soft. Bowel sounds are normal. He exhibits no distension.  Musculoskeletal:  Left upper extremity in splint and sling  Neurological: He is alert.  Patient is oriented to person but not place and time. He did answer simple yes no questions. Followed simple commands. He did appear somewhat anxious during exam. Decrease insight and awareness. Moves all 4's with some limitation proximally more than distally, left more than right (inconsistent). Stocking glove sensory loss from the knees to the feet. Also sensory loss to pin prick in the hands.  Skin: Skin is warm and dry.  Numerous scabs and scars on limbs, trunk. Incision intact Psychiatric:  anxious   dorsal left arm with dressing in place changes as per orthopedic services  Assessment/Plan: 1. Functional deficits secondary to polytrauma including pelvic fx's, left distal humerus fx which require 3+ hours  per day of interdisciplinary therapy in a comprehensive inpatient rehab setting. Physiatrist is providing close team supervision and 24 hour management of active medical problems listed below. Physiatrist and rehab team continue to assess barriers to discharge/monitor patient progress toward functional and medical goals. FIM: FIM - Bathing Bathing Steps Patient Completed: Right Arm, Abdomen, Front perineal area, Right upper leg, Left upper leg, Buttocks, Chest, Right lower leg (including foot) Bathing: 4: Min-Patient completes 8-9 36f 10 parts or 75+ percent  FIM - Upper Body Dressing/Undressing Upper body dressing/undressing steps patient completed: Thread/unthread right sleeve of pullover shirt/dresss, Put head through opening of pull over shirt/dress, Thread/unthread left sleeve of pullover shirt/dress, Pull shirt over trunk Upper body dressing/undressing: 5: Supervision: Safety issues/verbal cues FIM - Lower Body Dressing/Undressing Lower body dressing/undressing steps patient completed: Thread/unthread right underwear leg, Pull underwear up/down, Thread/unthread right pants leg, Pull pants up/down, Don/Doff right sock Lower body dressing/undressing: 3: Mod-Patient completed 50-74% of tasks  FIM - Musician Devices: Grab bar or rail for support Toileting: 1: Total-Patient completed zero steps, helper did all 3  FIM - Radio producer Devices: Grab bars Toilet Transfers: 4-From toilet/BSC: Min A (steadying Pt. > 75%), 4-To toilet/BSC: Min A (steadying Pt. > 75%)  FIM - Bed/Chair Transfer Bed/Chair Transfer Assistive Devices: Arm rests Bed/Chair Transfer: 5: Sit > Supine: Supervision (verbal cues/safety issues), 5: Supine > Sit: Supervision (verbal cues/safety issues), 5: Bed > Chair or W/C: Supervision (verbal cues/safety issues), 5: Chair or W/C > Bed: Supervision (verbal cues/safety issues)  FIM - Locomotion: Wheelchair  Distance:  50 Locomotion: Wheelchair: 1: Total Assistance/staff pushes wheelchair (Pt<25%) FIM - Locomotion: Ambulation Locomotion: Ambulation Assistive Devices: Nurse, adult ([facility] then [employer] then [place]) Ambulation/Gait Assistance: 4: Min assist Locomotion: Ambulation: 2: Travels 50 - 149 ft with minimal assistance (Pt.>75%)  Comprehension Comprehension Mode: Auditory Comprehension: 4-Understands basic 75 - 89% of the time/requires cueing 10 - 24% of the time  Expression Expression Mode: Verbal Expression: 5-Expresses basic needs/ideas: With extra time/assistive device  Social Interaction Social Interaction: 5-Interacts appropriately 90% of the time - Needs monitoring or encouragement for participation or interaction.  Problem Solving Problem Solving: 4-Solves basic 75 - 89% of the time/requires cueing 10 - 24% of the time  Memory Memory: 3-Recognizes or recalls 50 - 74% of the time/requires cueing 25 - 49% of the time  Medical Problem List and Plan: 1. Functional deficits secondary to multitrauma after a fall with left distal humerus fracture, pelvic fx's status post ORIF humerus fx. Nonweightbearing left upper extremity and splint was discontinued 11/26/2015. Left C1 pelvic ring fracture weightbearing as tolerated. 2. DVT Prophylaxis/Anticoagulation: Subcutaneous Lovenox. Check vascular study 3. Pain Management: Oxycodone as needed. Monitor with increased mobility 4. Mood/agitation: Xanax 0.5 mg tid.  -began scheduled librium  -seroquel qhs agitation 5. Neuropsych: This patient is not capable of making decisions on his own behalf. 6. Skin/Wound Care: Routine skin checks 7. Fluids/Electrolytes/Nutrition: encourage PO 8. History of alcohol abuse as well as illicit benzodiazepine use. Monitor for any signs of withdrawal. Discuss with family 9. Hyperlipidemia. Zocor 10. History of gout. Monitor for any signs of flareup/continue allopurinol 11. Leukocytosis: up to 23k yesterday.   -remains is  afebrile  -no clinical signs of infection except for the dysuria---UA with a few leukocytes  -dopplers negative  -serial cbc's  -given counts, low grade temp, urinary symptoms---will initiate keflex  -if further temp, increase in wbc's, may have to consider ct abdomen LOS (Days) 5 A FACE TO FACE EVALUATION WAS PERFORMED  SWARTZ,ZACHARY T 11/30/2014 9:37 AM

## 2014-12-01 ENCOUNTER — Inpatient Hospital Stay (HOSPITAL_COMMUNITY): Payer: 59

## 2014-12-01 ENCOUNTER — Encounter (HOSPITAL_COMMUNITY): Payer: Self-pay

## 2014-12-01 ENCOUNTER — Inpatient Hospital Stay (HOSPITAL_COMMUNITY): Payer: Self-pay | Admitting: Speech Pathology

## 2014-12-01 DIAGNOSIS — A499 Bacterial infection, unspecified: Secondary | ICD-10-CM

## 2014-12-01 DIAGNOSIS — R413 Other amnesia: Secondary | ICD-10-CM

## 2014-12-01 DIAGNOSIS — N390 Urinary tract infection, site not specified: Secondary | ICD-10-CM

## 2014-12-01 LAB — CBC
HEMATOCRIT: 27.3 % — AB (ref 39.0–52.0)
Hemoglobin: 8.9 g/dL — ABNORMAL LOW (ref 13.0–17.0)
MCH: 33.6 pg (ref 26.0–34.0)
MCHC: 32.6 g/dL (ref 30.0–36.0)
MCV: 103 fL — AB (ref 78.0–100.0)
PLATELETS: 886 10*3/uL — AB (ref 150–400)
RBC: 2.65 MIL/uL — ABNORMAL LOW (ref 4.22–5.81)
RDW: 16.9 % — AB (ref 11.5–15.5)
WBC: 20.6 10*3/uL — AB (ref 4.0–10.5)

## 2014-12-01 MED ORDER — QUETIAPINE FUMARATE 50 MG PO TABS
50.0000 mg | ORAL_TABLET | Freq: Every day | ORAL | Status: DC
  Administered 2014-12-01 – 2014-12-02 (×2): 50 mg via ORAL
  Filled 2014-12-01 (×3): qty 1

## 2014-12-01 MED ORDER — AMOXICILLIN 500 MG PO CAPS
500.0000 mg | ORAL_CAPSULE | Freq: Three times a day (TID) | ORAL | Status: AC
Start: 2014-12-01 — End: 2014-12-07
  Administered 2014-12-01 – 2014-12-07 (×21): 500 mg via ORAL
  Filled 2014-12-01 (×23): qty 1

## 2014-12-01 NOTE — Progress Notes (Signed)
Pt was very restless last night and was frequently getting out of bed unassisted after being reminded multiple times to call for assistance. Pt seemed somewhat more confused last night and was saying he had to get up at 2am to have his meeting. Pt was reoriented and assisted back to bed. Trazodone was given for sleep but was not effective. Will continue to monitor.

## 2014-12-01 NOTE — Progress Notes (Addendum)
[place] PHYSICAL MEDICINE & REHABILITATION     PROGRESS NOTE    Subjective/Complaints: Had some confusion last night but seems to be doing better this morning. Appreciative of team's effort.  A  review of systems has been performed and if not noted above is otherwise negative.   Objective: Vital Signs: Blood pressure 98/60, pulse 85, temperature 99 F (37.2 C), temperature source Oral, resp. rate 18, SpO2 95 %. No results found.  Recent Labs  11/29/14 1132 11/30/14 1255  WBC 23.3* 20.3*  HGB 8.4* 9.1*  HCT 25.7* 27.8*  PLT 742* 838*   No results for input(s): NA, K, CL, GLUCOSE, BUN, CREATININE, CALCIUM in the last 72 hours.  Invalid input(s): CO CBG (last 3)  No results for input(s): GLUCAP in the last 72 hours.  Wt Readings from Last 3 Encounters:  11/17/14 74.844 kg (165 lb)    Physical Exam:  Head: Normocephalic.  Eyes: EOM are normal.  Neck: Normal range of motion. Neck supple. No tracheal deviation present. No thyromegaly present.  Cardiovascular: Normal rate and regular rhythm.  Respiratory: Effort normal and breath sounds normal. No respiratory distress.  GI: Soft. Bowel sounds are normal. He exhibits no distension.  Musculoskeletal:  Left upper extremity in splint and sling  Neurological: He is alert.  Patient is oriented to person but not place and time. He did answer simple yes no questions. Followed simple commands. He did appear somewhat anxious during exam. Decrease insight and awareness. Moves all 4's with some limitation proximally more than distally, left more than right (inconsistent). Stocking glove sensory loss from the knees to the feet. Also sensory loss to pin prick in the hands.  Skin: Skin is warm and dry.  Numerous scabs and scars on limbs, trunk. Incision intact Psychiatric:  anxious   dorsal left arm with dressing in place changes as per orthopedic services  Assessment/Plan: 1. Functional deficits secondary to polytrauma  including pelvic fx's, left distal humerus fx which require 3+ hours per day of interdisciplinary therapy in a comprehensive inpatient rehab setting. Physiatrist is providing close team supervision and 24 hour management of active medical problems listed below. Physiatrist and rehab team continue to assess barriers to discharge/monitor patient progress toward functional and medical goals. FIM: FIM - Bathing Bathing Steps Patient Completed: Right Arm, Abdomen, Front perineal area, Right upper leg, Left upper leg, Buttocks, Chest, Right lower leg (including foot) Bathing: 4: Min-Patient completes 8-9 36f 10 parts or 75+ percent  FIM - Upper Body Dressing/Undressing Upper body dressing/undressing steps patient completed: Thread/unthread right sleeve of pullover shirt/dresss, Put head through opening of pull over shirt/dress, Thread/unthread left sleeve of pullover shirt/dress, Pull shirt over trunk Upper body dressing/undressing: 5: Supervision: Safety issues/verbal cues FIM - Lower Body Dressing/Undressing Lower body dressing/undressing steps patient completed: Thread/unthread right underwear leg, Pull underwear up/down, Thread/unthread right pants leg, Pull pants up/down, Don/Doff right sock, Don/Doff left sock Lower body dressing/undressing: 3: Mod-Patient completed 50-74% of tasks  FIM - Musician Devices: Grab bar or rail for support Toileting: 0: Activity did not occur  FIM - Radio producer Devices: Grab bars Toilet Transfers: 4-From toilet/BSC: Min A (steadying Pt. > 75%), 4-To toilet/BSC: Min A (steadying Pt. > 75%)  FIM - Bed/Chair Transfer Bed/Chair Transfer Assistive Devices: Arm rests Bed/Chair Transfer: 5: Sit > Supine: Supervision (verbal cues/safety issues), 5: Supine > Sit: Supervision (verbal cues/safety issues), 5: Bed > Chair or W/C: Supervision (verbal cues/safety issues), 5: Chair or W/C >  Bed: Supervision (verbal cues/safety  issues)  FIM - Locomotion: Wheelchair Distance: 50 Locomotion: Wheelchair: 1: Total Assistance/staff pushes wheelchair (Pt<25%) FIM - Locomotion: Ambulation Locomotion: Ambulation Assistive Devices: Nurse, adult ([facility] then [employer] then [facility]) Ambulation/Gait Assistance: 4: Min assist Locomotion: Ambulation: 2: Travels 50 - 149 ft with minimal assistance (Pt.>75%)  Comprehension Comprehension Mode: Auditory Comprehension: 5-Understands basic 90% of the time/requires cueing < 10% of the time  Expression Expression Mode: Verbal Expression: 4-Expresses basic 75 - 89% of the time/requires cueing 10 - 24% of the time. Needs helper to occlude trach/needs to repeat words.  Social Interaction Social Interaction: 5-Interacts appropriately 90% of the time - Needs monitoring or encouragement for participation or interaction.  Problem Solving Problem Solving: 4-Solves basic 75 - 89% of the time/requires cueing 10 - 24% of the time  Memory Memory: 3-Recognizes or recalls 50 - 74% of the time/requires cueing 25 - 49% of the time  Medical Problem List and Plan: 1. Functional deficits secondary to multitrauma after a fall with left distal humerus fracture, pelvic fx's status post ORIF humerus fx. Nonweightbearing left upper extremity and splint was discontinued 11/26/2015. Left C1 pelvic ring fracture weightbearing as tolerated. 2. DVT Prophylaxis/Anticoagulation: Subcutaneous Lovenox. Check vascular study 3. Pain Management: Oxycodone as needed. Monitor with increased mobility 4. Mood/agitation: Xanax 0.5 mg tid.  -began scheduled librium  -seroquel qhs agitation--will schedule 50 at hs 5. Neuropsych: This patient is not capable of making decisions on his own behalf. 6. Skin/Wound Care: Routine skin checks 7. Fluids/Electrolytes/Nutrition: encourage PO 8. History of alcohol abuse as well as illicit benzodiazepine use. Monitor for any signs of withdrawal. Discuss with family 9. Hyperlipidemia.  Zocor 10. History of gout. Monitor for any signs of flareup/continue allopurinol 11. Leukocytosis: up to 23k yesterday.   -remains is afebrile  -no clinical signs of infection except for the dysuria---UA with a few leukocytes  -dopplers negative  -serial cbc's  -urine with 100k GNR---change to amoxil   LOS (Days) 6 A FACE TO FACE EVALUATION WAS PERFORMED  Edward Singleton T 12/01/2014 7:57 AM

## 2014-12-01 NOTE — Progress Notes (Signed)
Social Work Patient ID: Edward Singleton, male   DOB: July 01, 1951, 64 y.o.   MRN: [id number]   Spoke with pt this afternoon to discuss d/c planning issues.  Pt aware that therapies have set goals for supervision (currently more min assist overall).  Pt confirms that he does NOT have 24/7 care available at home.  We discussed option of SNF and pt is agreeable with this. He understands that I need to begin bed search ASAP in order to transfer him while he still qualifies for SNF.  Have alerted [employer] CM, Newman Nickels, who is in agreement with this plan.  Will alert tx team.  Lennart Pall, LCSW

## 2014-12-01 NOTE — Progress Notes (Signed)
Occupational Therapy Note  Patient Details  Name: Edward Singleton MRN: [id number] Date of Birth: 1951/03/04  Today's Date: 12/01/2014 OT Missed Time: 39 Minutes Missed Time Reason: Patient fatigue;Pain  Pt missed 60 mins skilled OT services.  Pt resting in bed upon arrival.  Pt stated he was exhausted because he hadn't slept the night before.  Pt also stated the pain in his back had not "let up." Pt declined therapy this morning.   Leotis Shames [facility] 12/01/2014, 8:41 AM

## 2014-12-01 NOTE — Progress Notes (Signed)
Speech Language Pathology Daily Session Note  Patient Details  Name: Edward Singleton MRN: [id number] Date of Birth: 12-15-1950  Today's Date: 12/01/2014 SLP Individual Time: 1000-1100 SLP Individual Time Calculation (min): 60 min  Short Term Goals: Week 1: SLP Short Term Goal 1 (Week 1): Patient will orient to time and situation with Mod A multimodal cueing. SLP Short Term Goal 2 (Week 1): Patient will demonstrate functional problem solving for basic daily situations with Mod A multimodal cueing. SLP Short Term Goal 3 (Week 1): Patient will use external memory aids to recall new, daily information with Mod  A multimodal cueing. SLP Short Term Goal 4 (Week 1): Patient will maintain selective attention in a mildy distracting environment with Mod A multimodal cueing for ~10 minutes. SLP Short Term Goal 5 (Week 1): Patient will identify 2 physical and 2 cognitive imapirments with Mod A multimodal cues. SLP Short Term Goal 6 (Week 1): Patient will utilize callbell to request assistance with Mod A multimodal cues.  Skilled Therapeutic Interventions:  Pt was seen for skilled ST targeting cognitive goals.  Upon arrival, pt was asleep in bed but was easily awakened to voice and light touch and agreeable to participate in [place].  Pt was transferred to wheelchair to maximize attention and alertness during cognitive tasks.  SLP facilitated the session with a familiar card game targeting functional problem solving and selective attention in a moderately distracting environment.  Pt completed the abovementioned task with overall min-mod assist verbal cues for organization and working memory.  As session progressed, pt presented with increased complaints of back pain from sitting up in the wheelchair.  Pt also requested to use the restroom and required mod verbal cues for recall of safety precautions as he reported that he could use the restroom by himself as well as for adherence to NWB precautions during transfer  from wheelchair to toilet. Pt was transferred back to bed and offered a 2-3 minute cognitive rest break to decrease frustration with therapy and maximize sustained/selective attention.  Following break, SLP engaged pt in a functional conversation regarding his current goals and progress in therapies during which pt required overall max assist to identify more than 1 deficit occurring s/p fall. At the end of the session, pt was reoriented to his daily schedule to facilitate improved prospective memory of upcoming therapies.  Continue per current plan of care.   FIM:  Comprehension Comprehension Mode: Auditory Comprehension: 5-Understands basic 90% of the time/requires cueing < 10% of the time Expression Expression Mode: Verbal Expression: 5-Expresses basic 90% of the time/requires cueing < 10% of the time. Social Interaction Social Interaction: 5-Interacts appropriately 90% of the time - Needs monitoring or encouragement for participation or interaction. Problem Solving Problem Solving: 4-Solves basic 75 - 89% of the time/requires cueing 10 - 24% of the time Memory Memory: 4-Recognizes or recalls 75 - 89% of the time/requires cueing 10 - 24% of the time  Pain Pain Assessment Pain Assessment: Faces Pain Score: 8  Faces Pain Scale: Hurts a little bit Pain Type: Acute pain Pain Location: Back Pain Orientation: Left Pain Descriptors / Indicators: Aching Pain Frequency: Constant Pain Intervention(s): Repositioned  Therapy/Group: Individual Therapy  Edward Singleton, Selinda Orion 12/01/2014, 12:53 PM

## 2014-12-01 NOTE — Progress Notes (Signed)
Physical Therapy Session Note  Patient Details  Name: Edward Singleton MRN: [id number] Date of Birth: Feb 15, 1951  Today's Date: 12/01/2014 PT Individual Time: 1600-1700 PT Individual Time Calculation (min): 60 min   Short Term Goals: Week 1:  PT Short Term Goal 1 (Week 1): Pt will perform bed <> wheelchair transfer with consistent min A and 50% cues for LUE NWB.  PT Short Term Goal 2 (Week 1): Pt will perform bed mobility with supervision and 50% cues for LUE NWB.  PT Short Term Goal 3 (Week 1): Pt will ambulate 30 ft using LRAD with assist of one person.  PT Short Term Goal 4 (Week 1): Pt will negotiate up/down 5 stairs using R rail with assist of one person.   Skilled Therapeutic Interventions/Progress Updates:  1:1. Pt received supine in bed, willing and ready to participate in physical therapy. Session focused on BADL, functional transfers and ambulation, w/c propulsion and stair negotiation. Throughout session, pt required supervision-minA for all stand pivot transfers and toilet transfers. Pt engaged in BADL retraining including bathing at sink with overall supervision, and dressing with sit<>stand from w/c. Supervision for bathing, grooming and upper body dressing, total for lower body dressing.Pt propelled w/c 100'x1 and 150'x1 with RUE and BLE with overall minA for steering and verbal cues for speed and safety. Pt negotiated up/down 12 stairs with one hand rail and overall minA, with step-to pattern. Pt ambulated 120'x1 with overall min guard - minA, with single point cane and min verbal cues for step length. Pt is demonstrating more normalized sequencing pattern with use of single point cane and increased endurance with all functional activities.  Student PT brought up conversation regarding d/c plan possibly to a SNF. Pt reported remembering having conversation with LCSW earlier in the afternoon, however he reports that he didn't have a conversation about possible d/c to SNF, however, pt  confirmed that he would not have 24/7 assistance available to him and was agreeable to the idea of d/c to SNF.  Pt continues to require max verbal cues for LUE NWB without sling during transitional movements. Pt left seated in w/c with quick release belt in place, all needs within reach, RN aware of location/position.  Therapy Documentation Precautions:  Precautions Precautions: Fall Required Braces or Orthoses: Other Brace/Splint, Sling Other Brace/Splint: LUE cast Restrictions Weight Bearing Restrictions: Yes RUE Weight Bearing: Weight bearing as tolerated LUE Weight Bearing: Non weight bearing RLE Weight Bearing: Weight bearing as tolerated LLE Weight Bearing: Weight bearing as tolerated Vital Signs: Therapy Vitals Pulse Rate: (!) 102 BP: 128/74 mmHg Patient Position (if appropriate): Sitting Oxygen Therapy SpO2: 98 % O2 Device: Not Delivered  See FIM for current functional status  Therapy/Group: Individual Therapy  Carlos Levering 12/01/2014, 5:09 PM

## 2014-12-01 NOTE — Progress Notes (Signed)
Physical Therapy Session Note  Patient Details  Name: Edward Singleton MRN: [id number] Date of Birth: 11-Jun-1951  Today's Date: 12/01/2014 PT Individual Time: 1130-1200 (Makeup session) PT Individual Time Calculation (min): 30 min   Short Term Goals: Week 1:  PT Short Term Goal 1 (Week 1): Pt will perform bed <> wheelchair transfer with consistent min A and 50% cues for LUE NWB.  PT Short Term Goal 2 (Week 1): Pt will perform bed mobility with supervision and 50% cues for LUE NWB.  PT Short Term Goal 3 (Week 1): Pt will ambulate 30 ft using LRAD with assist of one person.  PT Short Term Goal 4 (Week 1): Pt will negotiate up/down 5 stairs using R rail with assist of one person.   Skilled Therapeutic Interventions/Progress Updates:  1:1. Makeup session. Pt received supine in bed with RN in room providing medication, agreeable to participate in therapy as able due to pain. Focus this session on activity tolerance as well as B LE strength during w/c propulsion and use of NuStep. Pt req supervision for t/f sup>sit EOB with use of bed rails, min guard A for SPT bed>w/c<>NuStep with use of SPC, min cues for R hand placement. Pt propelled w/c 100' with R UE/B LE min A demonstrating very slow pace. Pt with fair tolerance to NuStep to target B LE strength in decreased WB position, level 2x8 min with pace in 20s. Pt transported back to room at end of session for time management.  Pt req min A for toileting at end of session. Pt left supine in bed with all needs in reach, bed alarm on.   Therapy Documentation Precautions:  Precautions Precautions: Fall Required Braces or Orthoses: Other Brace/Splint, Sling Other Brace/Splint: LUE cast Restrictions Weight Bearing Restrictions: Yes RUE Weight Bearing: Weight bearing as tolerated LUE Weight Bearing: Non weight bearing RLE Weight Bearing: Weight bearing as tolerated LLE Weight Bearing: Weight bearing as tolerated Pain: Pain Assessment Pain Assessment:  0-10 Pain Score: 8  Pain Type: Acute pain Pain Location: Flank Pain Orientation: Left Pain Descriptors / Indicators: Aching Pain Frequency: Constant Pain Intervention(s): Medication (See eMAR)  See FIM for current functional status  Therapy/Group: Individual Therapy  Gilmore Laroche 12/01/2014, 12:18 PM

## 2014-12-02 ENCOUNTER — Inpatient Hospital Stay (HOSPITAL_COMMUNITY): Payer: Self-pay | Admitting: *Deleted

## 2014-12-02 ENCOUNTER — Inpatient Hospital Stay (HOSPITAL_COMMUNITY): Payer: 59

## 2014-12-02 ENCOUNTER — Encounter (HOSPITAL_COMMUNITY): Payer: Self-pay

## 2014-12-02 ENCOUNTER — Inpatient Hospital Stay (HOSPITAL_COMMUNITY): Payer: Self-pay

## 2014-12-02 DIAGNOSIS — R413 Other amnesia: Secondary | ICD-10-CM | POA: Insufficient documentation

## 2014-12-02 LAB — CBC
HEMATOCRIT: 28 % — AB (ref 39.0–52.0)
HEMOGLOBIN: 9.1 g/dL — AB (ref 13.0–17.0)
MCH: 33.7 pg (ref 26.0–34.0)
MCHC: 32.5 g/dL (ref 30.0–36.0)
MCV: 103.7 fL — ABNORMAL HIGH (ref 78.0–100.0)
Platelets: 946 10*3/uL (ref 150–400)
RBC: 2.7 MIL/uL — AB (ref 4.22–5.81)
RDW: 17 % — ABNORMAL HIGH (ref 11.5–15.5)
WBC: 20.7 10*3/uL — ABNORMAL HIGH (ref 4.0–10.5)

## 2014-12-02 LAB — URINE CULTURE

## 2014-12-02 MED ORDER — NICOTINE 14 MG/24HR TD PT24
14.0000 mg | MEDICATED_PATCH | Freq: Every day | TRANSDERMAL | Status: DC
  Administered 2014-12-02 – 2014-12-08 (×7): 14 mg via TRANSDERMAL
  Filled 2014-12-02 (×10): qty 1

## 2014-12-02 MED ORDER — ZOLPIDEM TARTRATE 5 MG PO TABS
5.0000 mg | ORAL_TABLET | Freq: Every evening | ORAL | Status: DC | PRN
  Administered 2014-12-04: 5 mg via ORAL
  Filled 2014-12-02: qty 1

## 2014-12-02 NOTE — Progress Notes (Signed)
Occupational Therapy Session Note  Patient Details  Name: UWAIS BOMMER MRN: [id number] Date of Birth: 1951/02/12  Today's Date: 12/02/2014 OT Individual Time: 1000-1100 OT Individual Time Calculation (min): 60 min    Short Term Goals: Week 1:  OT Short Term Goal 1 (Week 1): Pt will complete bathing with min A and min cues for sequencing  OT Short Term Goal 2 (Week 1): Pt will complete LB dressing with min A  OT Short Term Goal 3 (Week 1): Pt will demonstrate sustained attention to functional task for 1 min with mod cues OT Short Term Goal 4 (Week 1): Pt will identify 1 cognitive or physical deficit with mod cues  Skilled Therapeutic Interventions/Progress Updates:    Pt seated in w/c upon arrival.  Pt declined bathing and dressing this morning.  Pt not oriented to place, situation, or date.  Pt required max verbal cues to orient to place but unable to retain information.  Pt's conversation was tangential throughout session.  Attempted to engage patient in therapeutic activities but patient was unable to attend to task and continued to perseverate on unrelated and obscure subjects.  Focus on cognitive remediation including orientation, functional transfers, standing balance, and safety awareness.  Therapy Documentation Precautions:  Precautions Precautions: Fall Required Braces or Orthoses: Other Brace/Splint, Sling Other Brace/Splint: LUE cast Restrictions Weight Bearing Restrictions: Yes RUE Weight Bearing: Weight bearing as tolerated LUE Weight Bearing: Non weight bearing RLE Weight Bearing: Weight bearing as tolerated LLE Weight Bearing: Weight bearing as tolerated Pain: Pain Assessment Pain Assessment: 0-10 Pain Score: 8  Pain Type: Acute pain Pain Location: Rib cage Pain Orientation: Left;Lower Pain Descriptors / Indicators: Aching Pain Onset: On-going Pain Intervention(s): RN made aware;Repositioned;Rest  See FIM for current functional status  Therapy/Group:  Individual Therapy  Leroy Libman 12/02/2014, 12:08 PM

## 2014-12-02 NOTE — Progress Notes (Signed)
Physical Therapy Session Note  Patient Details  Name: Edward Singleton MRN: [id number] Date of Birth: [birth year]  Today's Date: 12/02/2014 PT Individual Time: Treatment Session 1: 0900-1000 Treatment Session 2: 1130-1200 PT Individual Time Calculation (min): Treatment Session 1: 60 min; Treatment Session 2: 30 min; (90 min total)  Short Term Goals: Week 1:  PT Short Term Goal 1 (Week 1): Pt will perform bed <> wheelchair transfer with consistent min A and 50% cues for LUE NWB.  PT Short Term Goal 2 (Week 1): Pt will perform bed mobility with supervision and 50% cues for LUE NWB.  PT Short Term Goal 3 (Week 1): Pt will ambulate 30 ft using LRAD with assist of one person.  PT Short Term Goal 4 (Week 1): Pt will negotiate up/down 5 stairs using R rail with assist of one person.   Skilled Therapeutic Interventions/Progress Updates:  Treatment Session 1: 1:1. Pt received supine in bed, willing and ready to participate in physical therapy. Session focused on functional transfers, w/c propulsion, sitting/standing balance, and cognitive remediation. Throughout session, pt required supervision-min guard for all stand pivot transfers and minA for toilet transfers. Pt propelled w/c to therapy gym, 150', with overall minA and min cues for speed and steering. Seated EOM, pt performed 4 bouts x4 reaching for objects requiring sit<>stand without use of BUE with min guard for safety, no LOB throughout activity. Pt was transported back to room due to urinary urgency and transferred with RUE use of grab bar and minA w/c<>toilet.  Pt noted with distinct odor, cloudy urine, and complaints of burning sensation when urinating. RN aware. Pt has demonstrated increasing confusion and memory deficits throughout the last 48 hours, asking for his keys/wallet/alarm clock and asking to speak to "the man who dropped me off after lunch and left my car at the restaurant from this afternoon." Attempted reorientation multiple times  throughout session but not sustained.  Pt required min cues today to maintain NWB status of LUE throughout session. Pt returned to room and left seated with quick release belt in place and all needs within reach.  Treatment Session 2: 1:1. Pt received seated in w/c at RN station, ready and willing to participate in physical therapy. Session focused on functional transfers and ambulation,   Pt transported to/from therapy apartment in w/c throughout session for time management. Pt performed all bed mobility and furniture transfers from low surface couch with overall supervision and single point cane, with min cues for technique and safety. Pt ambulated around therapy apartment 15'x3 with single point cane and overall min guard assist. NuStep level 4 x61min with BLE and RUE with good tolerance to address enducrance and LE strength. Pt continues to appear confused, similar to AM session.  Pt returned to RN station and left seated in w/c with quick release belt in place.  Therapy Documentation Precautions:  Precautions Precautions: Fall Required Braces or Orthoses: Other Brace/Splint, Sling Other Brace/Splint: LUE cast Restrictions Weight Bearing Restrictions: Yes RUE Weight Bearing: Weight bearing as tolerated LUE Weight Bearing: Non weight bearing RLE Weight Bearing: Weight bearing as tolerated LLE Weight Bearing: Weight bearing as tolerated Vital Signs: Therapy Vitals Pulse Rate: (!) 103 BP: 113/63 mmHg Patient Position (if appropriate): Sitting Oxygen Therapy SpO2: 99 % O2 Device: Not Delivered Pain: Pain Assessment Pain Assessment: 0-10 Pain Score: 8  Pain Type: Acute pain Pain Location: Rib cage Pain Orientation: Left;Lower Pain Descriptors / Indicators: Aching Pain Onset: On-going Pain Intervention(s): RN made aware;Repositioned;Rest  See FIM for  current functional status  Therapy/Group: Individual Therapy  Carlos Levering 12/02/2014, 9:47 AM

## 2014-12-02 NOTE — Progress Notes (Signed)
Physical Therapy Session Note  Patient Details  Name: Edward Singleton MRN: [id number] Date of Birth: 09-13-1951  Today's Date: 12/02/2014 PT Individual Time: 14:00-15:00 (64min) make-up tx  Short Term Goals: Week 1:  PT Short Term Goal 1 (Week 1): Pt will perform bed <> wheelchair transfer with consistent min A and 50% cues for LUE NWB.  PT Short Term Goal 2 (Week 1): Pt will perform bed mobility with supervision and 50% cues for LUE NWB.  PT Short Term Goal 3 (Week 1): Pt will ambulate 30 ft using LRAD with assist of one person.  PT Short Term Goal 4 (Week 1): Pt will negotiate up/down 5 stairs using R rail with assist of one person.  Week 2:     Skilled Therapeutic Interventions/Progress Updates:  Tx focused on functional mobility training, activity tolerance, and cognitive remediation. Pt received at nurses station, agreeable to PT.   Pt self-propelled WC with close S and cues for safety/precautions x100'   Pt performed multiple sit<>stand and stand-step transfers, including toilet with close S overall. Pt required safety cues throughout, including NWB LUE.   Functional ambulation 1x150' and 2x50' with SPC and up to [place] for R LOB due to increased reliance on DME for support. Pt unable to modify gait quality with multi-modal cues. Pt was challegned with way-finding gym> room with cues to use signs for locating room number. Even when at his room, pt did not immediately recognize his name/room.   Pt instructed in multi-tasking, cognition and visual-spatial tasks during standing for activity tolerance. Pt able to stand with close S 3x10 min with up to max A cues for pipe tree and color sorting peg activities. Pt explained the pipe tree task as "building airplanes." Noted Neuropsych's assessment regarding these difficulties. Pt able to maintain standing without difficulty, but noted to have less LLE weight bearing despite cues.    Pt overall with notable confusion today. When asked about his  pain on 0-10 scale, he reported 15/10 and that it's better because it was 9/10 earlier. When questioned further, he stated 9 was greater than 15.      Therapy Documentation Precautions:  Precautions Precautions: Fall Required Braces or Orthoses: Other Brace/Splint, Sling Other Brace/Splint: LUE cast Restrictions Weight Bearing Restrictions: Yes RUE Weight Bearing: Weight bearing as tolerated LUE Weight Bearing: Non weight bearing RLE Weight Bearing: Weight bearing as tolerated LLE Weight Bearing: Weight bearing as tolerated Pani: 15/10, unable to reliably report, so modified tx based on sx and provided ice following.      See FIM for current functional status  Therapy/Group: Individual Therapy  Kennieth Rad, PT, DPT 12/02/2014, 2:22 PM

## 2014-12-02 NOTE — Progress Notes (Signed)
[facility] PHYSICAL MEDICINE & REHABILITATION     PROGRESS NOTE    Subjective/Complaints: Didn't sleep. Up much of the night.  A  review of systems has been performed and if not noted above is otherwise negative.   Objective: Vital Signs: Blood pressure 107/63, pulse 92, temperature 98.8 F (37.1 C), temperature source Oral, resp. rate 18, SpO2 98 %. No results found.  Recent Labs  12/01/14 0735 12/02/14 0617  WBC 20.6* 20.7*  HGB 8.9* 9.1*  HCT 27.3* 28.0*  PLT 886* PENDING   No results for input(s): NA, K, CL, GLUCOSE, BUN, CREATININE, CALCIUM in the last 72 hours.  Invalid input(s): CO CBG (last 3)  No results for input(s): GLUCAP in the last 72 hours.  Wt Readings from Last 3 Encounters:  11/17/14 74.844 kg (165 lb)    Physical Exam:  Head: Normocephalic.  Eyes: EOM are normal.  Neck: Normal range of motion. Neck supple. No tracheal deviation present. No thyromegaly present.  Cardiovascular: Normal rate and regular rhythm.  Respiratory: Effort normal and breath sounds normal. No respiratory distress.  GI: Soft. Bowel sounds are normal. He exhibits no distension.  Musculoskeletal:  Left upper extremity in splint and sling  Neurological: He is alert.  Patient is oriented to person confused.  He did appear somewhat anxious during exam. Decrease insight and awareness. Moves all 4's with some limitation proximally more than distally, left more than right (inconsistent). Stocking glove sensory loss from the knees to the feet. Also sensory loss to pin prick in the hands.  Skin: Skin is warm and dry.  Numerous scabs and scars on limbs, trunk. Incision intact Psychiatric:  Anxious, restless  Assessment/Plan: 1. Functional deficits secondary to polytrauma including pelvic fx's, left distal humerus fx which require 3+ hours per day of interdisciplinary therapy in a comprehensive inpatient rehab setting. Physiatrist is providing close team supervision and 24 hour  management of active medical problems listed below. Physiatrist and rehab team continue to assess barriers to discharge/monitor patient progress toward functional and medical goals. FIM: FIM - Bathing Bathing Steps Patient Completed: Right Arm, Abdomen, Front perineal area, Right upper leg, Left upper leg, Buttocks, Chest, Right lower leg (including foot) Bathing: 4: Min-Patient completes 8-9 57f 10 parts or 75+ percent  FIM - Upper Body Dressing/Undressing Upper body dressing/undressing steps patient completed: Thread/unthread right sleeve of pullover shirt/dresss, Put head through opening of pull over shirt/dress, Thread/unthread left sleeve of pullover shirt/dress, Pull shirt over trunk Upper body dressing/undressing: 5: Supervision: Safety issues/verbal cues FIM - Lower Body Dressing/Undressing Lower body dressing/undressing steps patient completed: Pull underwear up/down, Pull pants up/down Lower body dressing/undressing: 1: Total-Patient completed less than 25% of tasks  FIM - Toileting Toileting steps completed by patient: Performs perineal hygiene Toileting Assistive Devices: Grab bar or rail for support Toileting: 4: Steadying assist  FIM - Radio producer Devices: Grab bars Toilet Transfers: 4-From toilet/BSC: Min A (steadying Pt. > 75%), 4-To toilet/BSC: Min A (steadying Pt. > 75%)  FIM - Bed/Chair Transfer Bed/Chair Transfer Assistive Devices: Arm rests, Bed rails Bed/Chair Transfer: 5: Sit > Supine: Supervision (verbal cues/safety issues), 5: Supine > Sit: Supervision (verbal cues/safety issues), 4: Chair or W/C > Bed: Min A (steadying Pt. > 75%), 4: Bed > Chair or W/C: Min A (steadying Pt. > 75%)  FIM - Locomotion: Wheelchair Distance: 50 Locomotion: Wheelchair: 4: Travels 150 ft or more: maneuvers on rugs and over door sillls with minimal assistance (Pt.>75%) FIM - Locomotion: Ambulation Locomotion:  Ambulation Assistive Devices: Research scientist (physical sciences) Ambulation/Gait Assistance: 4: Min guard, 4: Min assist Locomotion: Ambulation: 2: Travels 50 - 149 ft with minimal assistance (Pt.>75%)  Comprehension Comprehension Mode: Auditory Comprehension: 5-Understands basic 90% of the time/requires cueing < 10% of the time  Expression Expression Mode: Verbal Expression: 5-Expresses basic 90% of the time/requires cueing < 10% of the time.  Social Interaction Social Interaction: 4-Interacts appropriately 75 - 89% of the time - Needs redirection for appropriate language or to initiate interaction.  Problem Solving Problem Solving: 3-Solves basic 50 - 74% of the time/requires cueing 25 - 49% of the time  Memory Memory: 4-Recognizes or recalls 75 - 89% of the time/requires cueing 10 - 24% of the time  Medical Problem List and Plan: 1. Functional deficits secondary to multitrauma after a fall with left distal humerus fracture, pelvic fx's status post ORIF humerus fx. Nonweightbearing left upper extremity and splint was discontinued 11/26/2015. Left C1 pelvic ring fracture weightbearing as tolerated. 2. DVT Prophylaxis/Anticoagulation: Subcutaneous Lovenox. Check vascular study 3. Pain Management: Oxycodone as needed. Monitor with increased mobility 4. Mood/agitation: Xanax 0.5 mg tid.  -scheduled librium  -seroquel qhs agitatiion  -resume nicoderm patch 5. Neuropsych: This patient is not capable of making decisions on his own behalf. 6. Skin/Wound Care: Routine skin checks 7. Fluids/Electrolytes/Nutrition: encourage PO 8. History of alcohol abuse as well as illicit benzodiazepine use. 9. Hyperlipidemia. Zocor 10. History of gout. Monitor for any signs of flareup/continue allopurinol 11. Leukocytosis: down to 20.7k.   -remains is afebrile  -dopplers negative  -serial cbc's  -urine with 100k GNR--on amoxil now---culture results pending   LOS (Days) 7 A FACE TO FACE EVALUATION WAS PERFORMED  Seven Dollens T 12/02/2014 8:01 AM

## 2014-12-02 NOTE — Progress Notes (Signed)
Occupational Therapy Note  Patient Details  Name: Edward Singleton MRN: [id number] Date of Birth: 08-29-1951  Today's Date: 12/02/2014 OT Individual Time: 1330-1400 OT Individual Time Calculation (min): 30 min   Pt denied pain Individual therapy  Pt resting in w/c upon arrival and agreeable to engage in therapy.  Pt consistently requested that this therapist show him how to remove QRB.  Pt required tot A for orientation to place and situation.  Pt's conversation continues to be tangential.  Pt engaged in standing tasks with mod A for attention to task.   Leotis Shames [facility] 12/02/2014, 2:59 PM

## 2014-12-02 NOTE — Progress Notes (Signed)
CRITICAL VALUE ALERT  Critical value received:  946  Date of notification:  12/02/14  Time of notification:  0730  Critical value read back:Yes.    Nurse who received alert:  Dorien Chihuahua  MD notified (1st page):  [place]  Time of first page:   MD notified (2nd page):  Time of second page:  Responding MD:  Linna Hoff  Time MD responded:  Monitoring for now thank you

## 2014-12-02 NOTE — Consult Note (Signed)
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    According to medical records, Edward Singleton was admitted to the rehab unit owing to "Functional deficits secondary to TBI, polytrauma." Records also indicate that he "is a 64 y.o. right handed male admitted 11/17/2014 after a fall while at home. Denied any chest pain syncope related to fall. Patient does have a history of alcohol abuse as well as illicit benzodiazepine use. Independent PTA living alone and still working. Cranial CT scan no acute intracranial abnormalities and a small right frontal scalp hematoma. Alcohol levels were negative on admission.Patient's ongoing bouts of agitation and restlessness - suspect alcohol - withdrawal protocol implemented."   During today's visit, [name] purportedly does not recall any aspects of the event that took place that led to his hospital admission. He also said that he does not recall very much (in general) about the day that it took place. He endorsed mild memory loss following the event but denied any other cognitive symptoms.   From an emotional standpoint, [name] reported being in generally good spirits. He said that he was not feeling the best on this particular day but he attributed this to not sleeping well last night. He denied having any history of mental health issues or treatment.  No adjustment issues endorsed. Suicidal/homicidal ideation, plan or intent was denied. No manic or hypomanic episodes were reported. The patient denied ever experiencing any auditory/visual hallucinations. No major behavioral or personality changes were endorsed. Of note, records indicate that [name] has a history of alcohol and benzodiazepine use. However, patient denies having any issues with alcohol consumption beyond saying that every  now and then he will drink too much, though he indicated this to mean drinking just over one or two alcoholic beverages in a sitting.   [name] feels that he is making progress in therapy. He described the rehab staff as "top notch." No barriers to therapy identified. He has three sisters and nephew to support him throughout this endeavor.   The patient was referred for neuropsychological consultation given the possibility of cognitive sequelae subsequent to the current medical status and in order to assist in treatment planning.   PROCEDURES: [3 units of 03474 on 12/01/14]  Diagnostic Interview Medical record review Behavioral observations  Neuropsychological testing  Repeatable Battery for the Assessment of Neuropsychological Status (form A)  TEST RESULTS:   RBANS Indices Scaled Score Percentile Description  Immediate Memory  65 1 Markedly impaired  Visuospatial/Constructional 75 5 Impaired   Language 75 7 Borderline impaired  Attention n/a n/a n/a  Delayed Memory 48 <1 Markedly impaired  Total Score n/a n/a n/a    RBANS Subtests Raw Score Percentile Description  List Learning 13 <1 Markedly impaired  Story Memory 13 6 Borderline impaired  Figure Copy 16 10 Below average  Line Orientation 12 5 Impaired  Picture Naming 9 8 Borderline impaired  Semantic Fluency 6 <1 Markedly impaired  Digit Span 8 14 Below average  Coding n/a n/a n/a  List Recall 0 <1 Markedly impaired  List Recognition 14 <1 Markedly impaired  Story Recall 0 <1 Markedly impaired  Figure recall 3 <1 Markedly impaired   Cognitive Evaluation: Test results revealed markedly impaired learning and memory, as well as semantic fluency. Spatial judgment was also impaired, and the following thinking skills were below expectation: constructional praxis,  and simple attention.   Behavioral Evaluation: [name] was appropriately dressed for season and situation, and he appeared tidy and well-groomed. Normal posture  was noted. He was generally friendly and rapport was easily established. His speech was as expected and he was able to express ideas effectively. He seemed to understand most test directions readily. His affect was somewhat blunted. Attention and motivation were adequate, though his response style was quite slow. Optimal test taking conditions were maintained.   IMPRESSION: Overall, [name] appears to be experiencing a severe degree of cognitive difficultly; at the level of dementia. Predominant issue with learning and memory were observed. Given all the information (particularly the reported history of alcohol consumption), I wonder whether the event that took place represents Wernicke's encephalopathy since the patient reportedly suffered confusion and gait disruption, though eye movement disorder is unclear. Staff could consider at least checking thiamine level if not already completed. Regardless of etiology, patient is at an increased risk of an adverse event given his cognitive difficulties and his return to work status as well as level of staff awareness should reflect this level of difficulty.   RECOMMENDATIONS  Recommendations for treatment team:  . When interacting with Mr. Andrzejewski, directions and information should be provided in a simple, straight forward manner, and the treatment team should avoid giving multiple instructions simultaneously.  Marland Kitchen He will minimally benefit from recognition cueing and an increase in structure and context (i.e., directions given in conversational format as opposed to a random list of instructions).   . To the extent possible, multitasking should be avoided. Marland Kitchen He requires more time than typical to process information. The treatment team may benefit from waiting for a verbal response to information before presenting additional information.  Marland Kitchen Be aware that he is suffering from mild visual spatial deficits of which he may not be aware. Taking this into  consideration will help tailor therapy and keep accidents at [place] as much as possible when he is trying to navigate his surroundings.  . Performance will generally be best in a structured, routine, and familiar environment, as opposed to situations involving complex problems.  . Frequent reorientation will be helpful . Establish consistent daily routines . Use of short treatment sessions . Attend carefully to basic physiologic needs (e.g., nutrition, toileting, sleep, etc.) . Maintain as much as possible a quiet treatment environment . Avoid overstimulation . Consider ordering brain MRI scan.    Recommendations for discharge planning:  . Complete a comprehensive neuropsychological evaluation as an outpatient in 6-8 months to assess for interval change. This can be done through Norton Pastel, PsyD by calling the following number: [phone].  Marland Kitchen Neuroimaging can show time-dependent vulnerability to damage in specific regions, and lesions often evolve ever weeks or months. As such, repeat head CT or brain MRI in 2-3 months might be beneficial.   . Maintain engagement in mentally, physically and cognitively stimulating activities.  . Strive to maintain a healthy lifestyle (e.g., proper diet and exercise) in order to promote physical, cognitive and emotional health.  . Due to the nature and severity of the symptoms noted during this evaluation, it is recommended that he initially obtain constant care and supervision following this hospitalization.  . Mr. Durrett cognitive / psychiatric symptoms also seem to place him at a significant vocational disadvantage.  . Establishing a power of attorney would be valuable.   . The patient should refrain from driving at this time.       Rutha Bouchard,  Psy.D.  Clinical Neuropsychologist  Rehabilitation Psychologist

## 2014-12-03 ENCOUNTER — Inpatient Hospital Stay (HOSPITAL_COMMUNITY): Payer: 59 | Admitting: *Deleted

## 2014-12-03 ENCOUNTER — Inpatient Hospital Stay (HOSPITAL_COMMUNITY): Payer: 59

## 2014-12-03 ENCOUNTER — Inpatient Hospital Stay (HOSPITAL_COMMUNITY): Payer: 59 | Admitting: Speech Pathology

## 2014-12-03 DIAGNOSIS — A499 Bacterial infection, unspecified: Secondary | ICD-10-CM

## 2014-12-03 LAB — CBC
HCT: 27.8 % — ABNORMAL LOW (ref 39.0–52.0)
Hemoglobin: 8.9 g/dL — ABNORMAL LOW (ref 13.0–17.0)
MCH: 33.1 pg (ref 26.0–34.0)
MCHC: 32 g/dL (ref 30.0–36.0)
MCV: 103.3 fL — AB (ref 78.0–100.0)
Platelets: 991 10*3/uL (ref 150–400)
RBC: 2.69 MIL/uL — ABNORMAL LOW (ref 4.22–5.81)
RDW: 16.7 % — AB (ref 11.5–15.5)
WBC: 16.6 10*3/uL — ABNORMAL HIGH (ref 4.0–10.5)

## 2014-12-03 LAB — PATHOLOGIST SMEAR REVIEW

## 2014-12-03 MED ORDER — QUETIAPINE FUMARATE 100 MG PO TABS
100.0000 mg | ORAL_TABLET | Freq: Every day | ORAL | Status: DC
  Filled 2014-12-03: qty 1

## 2014-12-03 MED ORDER — QUETIAPINE FUMARATE 100 MG PO TABS
100.0000 mg | ORAL_TABLET | Freq: Every day | ORAL | Status: DC
  Administered 2014-12-03 – 2014-12-07 (×5): 100 mg via ORAL
  Filled 2014-12-03 (×6): qty 1

## 2014-12-03 NOTE — Progress Notes (Signed)
Occupational Therapy Weekly Progress Note  Patient Details  Name: Edward Singleton MRN: [id number] Date of Birth: [birth year]  Beginning of progress report period: November 26, 2014 End of progress report period: December 03, 2014   Patient has met 4 of 4 short term goals.  Pt has made steady progress with BADLs but continues to require max verbal cues for orientation and safety awareness.  Pt is currently min A for functional transfers and LB bathing and dressing. Pt has exhibited periods of confusion over the three days prior to this progress note.  Pt requires mod verbal cues for sequencing with BADLs.  Pt required max verbal cues to maintain NWB status of LUE.   Patient continues to demonstrate the following deficits: decreased balance, decreased safety awareness, decreased attention, decreased activity tolerance, decreased awareness, decreased problem solving, decreased sequencing, decreased memory, decreased knowledge of awareness, impaired overall cognition, decreased strength, decreased functional use of LUE and therefore will continue to benefit from skilled OT intervention to enhance overall performance with BADLs, balance, activity tolerance, safety, awareness, and ability to compensate for deficits.  Patient progressing toward long term goals..  Continue plan of care.  OT Short Term Goals Week 1:  OT Short Term Goal 1 (Week 1): Pt will complete bathing with min A and min cues for sequencing  OT Short Term Goal 1 - Progress (Week 1): Met OT Short Term Goal 2 (Week 1): Pt will complete LB dressing with min A  OT Short Term Goal 2 - Progress (Week 1): Met OT Short Term Goal 3 (Week 1): Pt will demonstrate sustained attention to functional task for 1 min with mod cues OT Short Term Goal 3 - Progress (Week 1): Met OT Short Term Goal 4 (Week 1): Pt will identify 1 cognitive or physical deficit with mod cues OT Short Term Goal 4 - Progress (Week 1): Met Week 2:  OT Short Term Goal 1 (Week  2): STG=LTG secondary to ELOS      Therapy Documentation Precautions:  Precautions Precautions: Fall Required Braces or Orthoses: Other Brace/Splint, Sling Other Brace/Splint: LUE cast Restrictions Weight Bearing Restrictions: Yes RUE Weight Bearing: Weight bearing as tolerated LUE Weight Bearing: Non weight bearing RLE Weight Bearing: Weight bearing as tolerated LLE Weight Bearing: Weight bearing as tolerated See FIM for current functional status   Leroy Libman 12/03/2014, 2:56 PM

## 2014-12-03 NOTE — Progress Notes (Signed)
Pt agitated at 0230. Pt attempting to get out of bed and shower. RN and NT attempted to allow pt to sit in chair but pt would not wear his safety belt. Pt says he needs to get up and shower. Pt showing signs of agitation and confusion. RN treated patient for pain. Pt left in room in bed with bed alarm on. RN will continue to monitor.

## 2014-12-03 NOTE — Progress Notes (Signed)
[facility] PHYSICAL MEDICINE & REHABILITATION     PROGRESS NOTE    Subjective/Complaints: Still agitated last night with attempts to get OOB. A little less confused though  A  review of systems has been performed and if not noted above is otherwise negative.   Objective: Vital Signs: Blood pressure 95/63, pulse 79, temperature 97.9 F (36.6 C), temperature source Oral, resp. rate 18, SpO2 96 %. No results found.  Recent Labs  12/01/14 0735 12/02/14 0617  WBC 20.6* 20.7*  HGB 8.9* 9.1*  HCT 27.3* 28.0*  PLT 886* 946*   No results for input(s): NA, K, CL, GLUCOSE, BUN, CREATININE, CALCIUM in the last 72 hours.  Invalid input(s): CO CBG (last 3)  No results for input(s): GLUCAP in the last 72 hours.  Wt Readings from Last 3 Encounters:  11/17/14 74.844 kg (165 lb)    Physical Exam:  Head: Normocephalic.  Eyes: EOM are normal.  Neck: Normal range of motion. Neck supple. No tracheal deviation present. No thyromegaly present.  Cardiovascular: Normal rate and regular rhythm.  Respiratory: Effort normal and breath sounds normal. No respiratory distress.  GI: Soft. Bowel sounds are normal. He exhibits no distension.  Musculoskeletal:  Left upper extremity in splint and sling  Neurological: He is alert.  Patient is oriented to person confused.  He did appear somewhat anxious during exam. Decrease insight and awareness. Moves all 4's with some limitation proximally more than distally, left more than right (inconsistent). Stocking glove sensory loss from the knees to the feet. Also sensory loss to pin prick in the hands.  Skin: Skin is warm and dry.  Numerous scabs and scars on limbs, trunk. Incision intact Psychiatric:  Anxious, less restless----redirectable  Assessment/Plan: 1. Functional deficits secondary to polytrauma including pelvic fx's, left distal humerus fx which require 3+ hours per day of interdisciplinary therapy in a comprehensive inpatient rehab  setting. Physiatrist is providing close team supervision and 24 hour management of active medical problems listed below. Physiatrist and rehab team continue to assess barriers to discharge/monitor patient progress toward functional and medical goals. FIM: FIM - Bathing Bathing Steps Patient Completed: Right Arm, Abdomen, Front perineal area, Right upper leg, Left upper leg, Buttocks, Chest, Right lower leg (including foot) Bathing: 4: Min-Patient completes 8-9 5f 10 parts or 75+ percent  FIM - Upper Body Dressing/Undressing Upper body dressing/undressing steps patient completed: Thread/unthread right sleeve of pullover shirt/dresss, Put head through opening of pull over shirt/dress, Thread/unthread left sleeve of pullover shirt/dress, Pull shirt over trunk Upper body dressing/undressing: 5: Supervision: Safety issues/verbal cues FIM - Lower Body Dressing/Undressing Lower body dressing/undressing steps patient completed: Pull underwear up/down, Pull pants up/down Lower body dressing/undressing: 1: Total-Patient completed less than 25% of tasks  FIM - Toileting Toileting steps completed by patient: Performs perineal hygiene Toileting Assistive Devices: Grab bar or rail for support Toileting: 4: Steadying assist  FIM - Radio producer Devices: Grab bars Toilet Transfers: 4-From toilet/BSC: Min A (steadying Pt. > 75%), 4-To toilet/BSC: Min A (steadying Pt. > 75%)  FIM - Control and instrumentation engineer Devices: Arm rests, Best boy: 5: Bed > Chair or W/C: Supervision (verbal cues/safety issues), 4: Chair or W/C > Bed: Min A (steadying Pt. > 75%)  FIM - Locomotion: Wheelchair Distance: 150 Locomotion: Wheelchair: 4: Travels 150 ft or more: maneuvers on rugs and over door sillls with minimal assistance (Pt.>75%) (RUE and B LE) FIM - Locomotion: Ambulation Locomotion: Ambulation Assistive Devices: Cane - Straight Ambulation/Gait  Assistance: 4: Min assist Locomotion: Ambulation: 4: Travels 150 ft or more with minimal assistance (Pt.>75%)  Comprehension Comprehension Mode: Auditory Comprehension: 4-Understands basic 75 - 89% of the time/requires cueing 10 - 24% of the time  Expression Expression Mode: Verbal Expression: 5-Expresses basic needs/ideas: With extra time/assistive device  Social Interaction Social Interaction: 4-Interacts appropriately 75 - 89% of the time - Needs redirection for appropriate language or to initiate interaction.  Problem Solving Problem Solving: 4-Solves basic 75 - 89% of the time/requires cueing 10 - 24% of the time  Memory Memory: 3-Recognizes or recalls 50 - 74% of the time/requires cueing 25 - 49% of the time  Medical Problem List and Plan: 1. Functional deficits secondary to multitrauma after a fall with left distal humerus fracture, pelvic fx's status post ORIF humerus fx. Nonweightbearing left upper extremity and splint was discontinued 11/26/2015. Left C1 pelvic ring fracture weightbearing as tolerated. 2. DVT Prophylaxis/Anticoagulation: Subcutaneous Lovenox. Check vascular study 3. Pain Management: Oxycodone as needed. Monitor with increased mobility 4. Mood/agitation: Xanax 0.5 mg tid.  -scheduled librium  -seroquel qhs agitatiion  -resume nicoderm patch 5. Neuropsych: This patient is not capable of making decisions on his own behalf. 6. Skin/Wound Care: Routine skin checks 7. Fluids/Electrolytes/Nutrition: encourage PO 8. History of alcohol abuse as well as illicit benzodiazepine use. 9. Hyperlipidemia. Zocor 10. History of gout. Monitor for any signs of flareup/continue allopurinol 11. Leukocytosis: down to 20.7k.   -remains is afebrile  -dopplers negative  -serial cbc's  -Ecoli UTI----continue amoxil for 7 days  -follow up cbc today   LOS (Days) 8 A FACE TO FACE EVALUATION WAS PERFORMED  SWARTZ,ZACHARY T 12/03/2014 7:53 AM

## 2014-12-03 NOTE — Progress Notes (Signed)
Physical Therapy Session Note  Patient Details  Name: Edward Singleton MRN: [id number] Date of Birth: 1951/07/13  Today's Date: 12/03/2014 PT Individual Time: 1400-1500 PT Individual Time Calculation (min): 60 min   Short Term Goals: Week 1:  PT Short Term Goal 1 (Week 1): Pt will perform bed <> wheelchair transfer with consistent min A and 50% cues for LUE NWB.  PT Short Term Goal 2 (Week 1): Pt will perform bed mobility with supervision and 50% cues for LUE NWB.  PT Short Term Goal 3 (Week 1): Pt will ambulate 30 ft using LRAD with assist of one person.  PT Short Term Goal 4 (Week 1): Pt will negotiate up/down 5 stairs using R rail with assist of one person.   Skilled Therapeutic Interventions/Progress Updates:    Patient received sitting in wheelchair with RN present in room. Session focused on functional ambulation and transfers, standing balance, LE strengthening, and cognitive remediation with emphasis on orientation and intellectual/emergent awareness. Patient with report of needing to use bathroom, ambulates ~15' into bathroom with SPC and min guard into bathroom, able to manage clothing and transfer to toilet with supervision for continent episode of bowel and bladder; patient able to manage hygiene without assistance; RN aware.  Wheelchair mobility >150' x2 with R UE and B LEs and supervision in controlled environment. Functional ambulation x87' with SPC and min guard, cones already placed in hallway and patient initiating weaving in and out with proper sequencing and use of SPC. Stair negotiation x5 stairs with one handrail and min guard, patient initiates stating proper sequencing ("up with good, down with bad") without cues from therapist.  Georges Lynch exercises standing at table with R UE for support: heel raises, mini squats, hip abd, knee flex 2x10. Patient left sitting at RN station with seatbelt donned.  Patient demonstrating improved cognitive status during this session as  compared to others: patient oriented to self, location, month, and year; disoriented to situation. Patient initiating asking for use of sling at beginning of session stating "I've been using this L arm for 62 years, so it's hard to break the habit, but I need to wear that sling so I don't use it." Patient also demonstrating good  Intellectual awareness/insight into deficits during session, stating what he is unable to do.  Therapy Documentation Precautions:  Precautions Precautions: Fall Required Braces or Orthoses: Other Brace/Splint, Sling Other Brace/Splint: LUE cast Restrictions Weight Bearing Restrictions: Yes RUE Weight Bearing: Weight bearing as tolerated LUE Weight Bearing: Non weight bearing RLE Weight Bearing: Weight bearing as tolerated LLE Weight Bearing: Weight bearing as tolerated Pain: Pain Assessment Pain Assessment: No/denies pain Pain Score: 0-No pain Locomotion : Ambulation Ambulation/Gait Assistance: 4: Min guard Wheelchair Mobility Distance: 150   See FIM for current functional status  Therapy/Group: Individual Therapy  Lillia Abed. Consuelo Suthers, PT, DPT 12/03/2014, 3:20 PM

## 2014-12-03 NOTE — Progress Notes (Signed)
Speech Language Pathology Weekly Progress and Session Note  Patient Details  Name: Edward Singleton MRN: [id number] Date of Birth: 03/26/1951  Beginning of progress report period: November 26, 2014 End of progress report period: December 03, 2014  Today's Date: 12/03/2014 SLP Individual Time: 4098-1191 SLP Individual Time Calculation (min): 60 min  Short Term Goals: Week 1: SLP Short Term Goal 1 (Week 1): Patient will orient to time and situation with Mod A multimodal cueing. SLP Short Term Goal 1 - Progress (Week 1): Not met SLP Short Term Goal 2 (Week 1): Patient will demonstrate functional problem solving for basic daily situations with Mod A multimodal cueing. SLP Short Term Goal 2 - Progress (Week 1): Not met SLP Short Term Goal 3 (Week 1): Patient will use external memory aids to recall new, daily information with Mod  A multimodal cueing. SLP Short Term Goal 3 - Progress (Week 1): Not met SLP Short Term Goal 4 (Week 1): Patient will maintain selective attention in a mildy distracting environment with Mod A multimodal cueing for ~10 minutes. SLP Short Term Goal 4 - Progress (Week 1): Not met SLP Short Term Goal 5 (Week 1): Patient will identify 2 physical and 2 cognitive imapirments with Mod A multimodal cues. SLP Short Term Goal 5 - Progress (Week 1): Not met SLP Short Term Goal 6 (Week 1): Patient will utilize callbell to request assistance with Mod A multimodal cues. SLP Short Term Goal 6 - Progress (Week 1): Not met    New Short Term Goals: Week 2: SLP Short Term Goal 1 (Week 2): Patient will orient to time and situation with Max A multimodal cueing. SLP Short Term Goal 2 (Week 2): Patient will demonstrate functional problem solving for basic daily situations with Max A multimodal cueing. SLP Short Term Goal 3 (Week 2): Patient will use external memory aids to recall new, daily information with Max  A multimodal cueing. SLP Short Term Goal 4 (Week 2): Patient will maintain  sustained attention in a mildy distracting environment with Mod A multimodal cueing for ~10 minutes. SLP Short Term Goal 5 (Week 2): Patient will identify 2 physical and 2 cognitive imapirments with Max A multimodal cues. SLP Short Term Goal 6 (Week 2): Patient will utilize callbell to request assistance with Max A multimodal cues.  Weekly Progress Updates: Patient has made limited and inconsistent gains this reporting period and has met 0 out of 6 short term goals due to decreased sustained attention and working memory and increased confusion that is being addressed by physician. Patient currently requires Max-Total A multimodal cues for orientation to place, time, and situation and Max A multimodal cues for short-term memory, sustained attention, intellectual awareness of cognitive deficits, and functional problem solving, and Min A multimodal cues for safety awareness with regards to NWB status of LUE. Patient would benefit from continued skilled SLP intervention to maximize cognitive functioning and functional independence to decrease caregiver burden prior to discharge.   Intensity: Minumum of 1-2 x/day, 30 to 90 minutes Frequency: 3 to 5 out of 7 days Duration/Length of Stay: TBD due to SNF placement Treatment/Interventions: Cognitive remediation/compensation;Cueing hierarchy;Functional tasks;Therapeutic Activities;Patient/family education;Environmental controls;Internal/external aids   Daily Session  Skilled Therapeutic Interventions: Skilled treatment session focused on cognitive goals. Upon arrival, patient was sitting at nurse's station in wheelchair with quick-release belt in place. Student facilitated session by providing Max A multimodal cues for orientation to place and situation and for recall of basic biographical information after delay of 30 to  60 seconds. Patient required Total A multimodal cues for intellectual awareness of cognitive deficits and Max A multimodal cues for  sustaining attention for ~90 seconds in a mildly distracting environment. Suspect patient's intermittent arousal may be affecting cognitive performance, as patient reports that he did not sleep well last night. Patient left sitting upright in bed with 3 siderails up, bed alarm on, all needs within reach and sister present in room. Continue with current plan of care.     FIM:  Comprehension Comprehension Mode: Auditory Comprehension: 4-Understands basic 75 - 89% of the time/requires cueing 10 - 24% of the time Expression Expression Mode: Verbal Expression: 4-Expresses basic 75 - 89% of the time/requires cueing 10 - 24% of the time. Needs helper to occlude trach/needs to repeat words. Social Interaction Social Interaction: 2-Interacts appropriately 25 - 49% of time - Needs frequent redirection. Problem Solving Problem Solving: 2-Solves basic 25 - 49% of the time - needs direction more than half the time to initiate, plan or complete simple activities Memory Memory: 1-Recognizes or recalls less than 25% of the time/requires cueing greater than 75% of the time     Pain Pain Assessment Pain Assessment: No/denies pain  Therapy/Group: Individual Therapy  Servando Snare 12/03/2014, 2:22 PM

## 2014-12-03 NOTE — Progress Notes (Signed)
Occupational Therapy Session Note  Patient Details  Name: Edward Singleton MRN: [id number] Date of Birth: 1951-04-02  Today's Date: 12/03/2014 OT Individual Time: 0700-0800 OT Individual Time Calculation (min): 60 min    Short Term Goals: Week 1:  OT Short Term Goal 1 (Week 1): Pt will complete bathing with min A and min cues for sequencing  OT Short Term Goal 2 (Week 1): Pt will complete LB dressing with min A  OT Short Term Goal 3 (Week 1): Pt will demonstrate sustained attention to functional task for 1 min with mod cues OT Short Term Goal 4 (Week 1): Pt will identify 1 cognitive or physical deficit with mod cues  Skilled Therapeutic Interventions/Progress Updates:    Pt engaged in BADL retraining including bathing at shower level (LUE covered with plastic bags) and dressing with sit<>stand from w/c.  Pt required mod verbal cues for sequencing in shower.  Pt required min A for amb with SPC to bathroom and transfer to shower seat with mod verbal cues for safety awareness during task.  Pt issued long handle sponge to use for bathing BLE.  Pt's conversation continues to be tangential but not as severe as yesterday.  Pt continues to require mod verbal cues for safety awareness throughout session.  Focus on cognitive remediation, functional amb with SPC, functional transfers, sit<>stand, standing balance, and safety awareness.  Therapy Documentation Precautions:  Precautions Precautions: Fall Required Braces or Orthoses: Other Brace/Splint, Sling Other Brace/Splint: LUE cast Restrictions Weight Bearing Restrictions: Yes RUE Weight Bearing: Weight bearing as tolerated LUE Weight Bearing: Non weight bearing RLE Weight Bearing: Weight bearing as tolerated LLE Weight Bearing: Weight bearing as tolerated Pain: Pain Assessment Pain Assessment: No/denies pain Pain Score: 9  Pain Type: Acute pain Pain Location: Back Pain Orientation: Left Pain Descriptors / Indicators: Aching Pain Onset:  On-going Pain Intervention(s): Medication (See eMAR)  See FIM for current functional status  Therapy/Group: Individual Therapy  Leroy Libman 12/03/2014, 8:01 AM

## 2014-12-03 NOTE — Patient Care Conference (Signed)
Inpatient RehabilitationTeam Conference and Plan of Care Update Date: 12/02/2014   Time: 2:50 PM    Patient Name: Edward Singleton      Medical Record Number: [id number]  Date of Birth: [birth year] Sex: Male         Room/Bed: 4W13C/4W13C-01 Payor Info: Payor: Theme park manager / Plan: Theme park manager OTHER / Product Type: *No Product type* /    Admitting Diagnosis: POLY TRAUMA AFTER FALL WITH ETOH  Admit Date/Time:  11/25/2014  3:09 PM Admission Comments: No comment available   Primary Diagnosis:  Multiple stable closed lateral compression fractures of pelvis Principal Problem: Multiple stable closed lateral compression fractures of pelvis  Patient Active Problem List   Diagnosis Date Noted  . Memory loss   . Bacterial UTI 12/01/2014  . Acute blood loss anemia 11/25/2014  . Pelvic fracture 11/25/2014  . Protein-calorie malnutrition, severe 11/19/2014  . Closed fracture of left distal humerus 11/18/2014  . Multiple stable closed lateral compression fractures of pelvis, Left LC1 pelvic ring fx  11/18/2014  . Fall with multiple fractures 11/17/2014  . HTN (hypertension) 11/17/2014  . Acute on PRESUMED chronic kidney failure 11/17/2014  . Benzodiazepine abuse 11/17/2014  . Alcohol abuse 11/17/2014  . Dyslipidemia 11/17/2014  . History of nonadherence to medical treatment 11/17/2014  . Macrocytic anemia 11/17/2014    Expected Discharge Date:    Team Members Present:       Current Status/Progress Goal Weekly Team Focus  Medical   pain, ortho issues. sleep and cognition have regressed over the last 2 days  improve cognition, sleep.   sleep, cognition   Bowel/Bladder   Pt continent of bowel and bladder. UTI present-medication is use. increased frequency/  manage b/b independently  cont. use of bowel medication and antibotic   Swallow/Nutrition/ Hydration             ADL's   functional transfers-min A; bathing-min A; UB dressing-supervision; LB dressing-min A; toileting-steady  A; decreased safety awareness  supervision overall  funcitonal transfers; BADLs, safety awareness; standing balance, activity tolerance   Mobility   Pt currently requires consistent mod-max cues to maintain NWB status LUE. Pt currently at supervision for bed mobility and stand pivot transfers and sitting/standing balance, minA for stair negotiation and ambulation 125' with single point cane and w/c management up to 150'  LTGs downgraded. Supervision-min A for fucntional transfers and mobility while maintaining NWB status of LUE  Functional transfers and ambulation, balance, strength, activity tolerance, safety awareness, w/c propulsion, pt education and discharge planning.   Communication             Safety/Cognition/ Behavioral Observations  Max A  Min A  orientation, attention, awareness    Pain   pain to left mid back/ flank area. complaints of headaches at times; 15oxy q4hrs PRN  3 or less   medicate as needed. assess pain qshift   Skin   abrasions noted to RUE x2, foams inplace. scattered brusisng. LUE ACE inplace  remain free from infection and free of breakdown       Rehab Goals Patient on target to meet rehab goals: Yes *See Care Plan and progress notes for long and short-term goals.  Barriers to Discharge: cognition, etoh abuse    Possible Resolutions to Barriers:  rx UTI    Discharge Planning/Teaching Needs:  Plan changed to SNF as pt has supervision /min assist goals and family cannot provide this.      Team Discussion:  SW reports plan changed to  SNF.  UTI - being treated.  Worsened cognition the past couple of days and poor sleep.  Participating and making gains.  MD feels not quite medically ready to transfer to SNF -   Revisions to Treatment Plan:  Change in d/c plan to SNF   Continued Need for Acute Rehabilitation Level of Care: The patient requires daily medical management by a physician with specialized training in physical medicine and rehabilitation for the  following conditions: Daily direction of a multidisciplinary physical rehabilitation program to ensure safe treatment while eliciting the highest outcome that is of practical value to the patient.: Yes Daily medical management of patient stability for increased activity during participation in an intensive rehabilitation regime.: Yes Daily analysis of laboratory values and/or radiology reports with any subsequent need for medication adjustment of medical intervention for : Neurological problems;Post surgical problems  Britini Garcilazo 12/03/2014, 3:21 PM

## 2014-12-04 ENCOUNTER — Inpatient Hospital Stay (HOSPITAL_COMMUNITY): Payer: 59 | Admitting: Speech Pathology

## 2014-12-04 ENCOUNTER — Inpatient Hospital Stay (HOSPITAL_COMMUNITY): Payer: 59 | Admitting: Occupational Therapy

## 2014-12-04 ENCOUNTER — Inpatient Hospital Stay (HOSPITAL_COMMUNITY): Payer: 59

## 2014-12-04 LAB — CBC
HCT: 27 % — ABNORMAL LOW (ref 39.0–52.0)
Hemoglobin: 8.6 g/dL — ABNORMAL LOW (ref 13.0–17.0)
MCH: 33 pg (ref 26.0–34.0)
MCHC: 31.9 g/dL (ref 30.0–36.0)
MCV: 103.4 fL — ABNORMAL HIGH (ref 78.0–100.0)
Platelets: 974 10*3/uL (ref 150–400)
RBC: 2.61 MIL/uL — ABNORMAL LOW (ref 4.22–5.81)
RDW: 16.5 % — AB (ref 11.5–15.5)
WBC: 14.4 10*3/uL — ABNORMAL HIGH (ref 4.0–10.5)

## 2014-12-04 NOTE — Progress Notes (Signed)
Occupational Therapy Session Note  Patient Details  Name: Edward Singleton MRN: [id number] Date of Birth: [birth year]  Today's Date: 12/04/2014 OT Individual Time: 1400-1440 OT Individual Time Calculation (min): 40 min    Short Term Goals: Week 1:  OT Short Term Goal 1 (Week 1): Pt will complete bathing with min A and min cues for sequencing  OT Short Term Goal 1 - Progress (Week 1): Met OT Short Term Goal 2 (Week 1): Pt will complete LB dressing with min A  OT Short Term Goal 2 - Progress (Week 1): Met OT Short Term Goal 3 (Week 1): Pt will demonstrate sustained attention to functional task for 1 min with mod cues OT Short Term Goal 3 - Progress (Week 1): Met OT Short Term Goal 4 (Week 1): Pt will identify 1 cognitive or physical deficit with mod cues OT Short Term Goal 4 - Progress (Week 1): Met  Skilled Therapeutic Interventions/Progress Updates:    Engaged in therapeutic activity with focus on sit <> stand, standing balance, and adhering to precautions during activity.  Pt received in bed having just taken pain meds from RN.  In therapy gym engaged in Wii baseball activity in standing with focus on sit <> stand and standing balance.  Intermittent min/steady assist in standing especially when turning.  Upon return to room, pt reports need to toilet.  Stand pivot transfer w/c <> toilet with min/steady assist.  Pt returned to bed at end of session and left with all needs in reach.  Therapy Documentation Precautions:  Precautions Precautions: Fall Required Braces or Orthoses: Other Brace/Splint, Sling Other Brace/Splint: LUE cast Restrictions Weight Bearing Restrictions: Yes RUE Weight Bearing: Weight bearing as tolerated LUE Weight Bearing: Non weight bearing RLE Weight Bearing: Weight bearing as tolerated LLE Weight Bearing: Weight bearing as tolerated General: General OT Amount of Missed Time: 15 Minutes Vital Signs: Therapy Vitals Temp: 98.9 F (37.2 C) Temp Source:  Oral Pulse Rate: 89 Resp: 18 BP: 99/62 mmHg Patient Position (if appropriate): Lying Oxygen Therapy SpO2: 94 % O2 Device: Not Delivered Pain: Pain Assessment Pain Assessment: 0-10 Pain Score: 8  Pain Type: Acute pain Pain Location: Arm Pain Orientation: Left Pain Descriptors / Indicators: Aching Pain Intervention(s): Medication (See eMAR)  See FIM for current functional status  Therapy/Group: Individual Therapy  Simonne Come 12/04/2014, 3:22 PM

## 2014-12-04 NOTE — Progress Notes (Signed)
Physical Therapy Weekly Progress Note  Patient Details  Name: Edward Singleton MRN: [id number] Date of Birth: 1951/07/17  Beginning of progress report period: November 26, 2014 End of progress report period: December 04, 2014  Today's Date: 12/04/2014 PT Individual Time: 0800-0900 PT Individual Time Calculation (min): 60 min   Patient has met 4 of 4 short term goals.  Pt is currently mod(I) for bed mobility, supervision for all stand pivot transfer transfers and w/c propulsion up to 200' with BLE and RUE. Pt is supervision - min guard for ambulation up to 200' with SPC and negotiating up/down 15 steps with a step-to pattern and 1 hand rail on R. Pt is occasionally disoriented to time/place/situation, however at other times demonstrates ability to remember people, events and precautions without assist. Pt also continues to require occasional min cues to maintain NWB status of L UE if not wearing sling.  Patient continues to demonstrate the following deficits: decreased balance, decreased endurance, decreased strength, impaired cognition, impaired safety and impaired intellectual awareness and therefore will continue to benefit from skilled PT intervention to enhance overall performance with activity tolerance, balance, postural control, awareness and knowledge of precautions.  Patient progressing toward modified long term goals. See goal revision.  Plan of care revisions: Pt goals for dynamic standing balance, bed mobility and functional transfers were all upgraded due to  improved safety awareness, balance and ability to maintain NWB status of LUE. However, pt goals remain at overall supervision level with min A for ambulation and stair negotiation.  PT Short Term Goals Week 1:  PT Short Term Goal 1 (Week 1): Pt will perform bed <> wheelchair transfer with consistent min A and 50% cues for LUE NWB.  PT Short Term Goal 1 - Progress (Week 1): Met PT Short Term Goal 2 (Week 1): Pt will perform bed  mobility with supervision and 50% cues for LUE NWB.  PT Short Term Goal 2 - Progress (Week 1): Met PT Short Term Goal 3 (Week 1): Pt will ambulate 30 ft using LRAD with assist of one person.  PT Short Term Goal 3 - Progress (Week 1): Met PT Short Term Goal 4 (Week 1): Pt will negotiate up/down 5 stairs using R rail with assist of one person.  PT Short Term Goal 4 - Progress (Week 1): Met Week 2:  PT Short Term Goal 1 (Week 2): STG = LTG due to estimated length of stay  Skilled Therapeutic Interventions/Progress Updates:  1:1. Pt received supine in bed, ready and willing to participate in physical therapy. Session focused on functional transfers and ambulation, stair negotiation, and formal balance reassessment. Pt performed all bed mobility throughout session at mod(I) level, and all stand pivot transfers at overall supervision with use of SPC. Pt propelled 200'x1 with use of B LE and R UE with overall supervision and ambulated 200'x1 with supervision - min guard with SPC with antalgic gait at a significantly decreased speed. Formal balance reassessment of 5xSTS and TUG were performed, pt completing in 10sec and 17sec respectively. The Berg balance test was administered and pt scored 39/56 indicating a continued increased risk for falls, however we have seen improvements in both the 5xSTS and TUG (previous scores 5xSTS = 21 and TUG = 45). Pt verbalized understanding of overall decreased balance with formal balance scores indicating increased risk for falls and continued need for supervision at all times.  Pt oriented x4 this session. Cognition and memory appeared better than previous sessions in the week, able  to accurately remember events from yesterday and day before including details of therapy. Overall, pt seems to be less confused and more energized compared to earlier in the week. Pt left seated in w/c at RN station with quick release belt in place, RN aware.  Therapy Documentation Precautions:   Precautions Precautions: Fall Required Braces or Orthoses: Other Brace/Splint, Sling Other Brace/Splint: LUE cast Restrictions Weight Bearing Restrictions: Yes RUE Weight Bearing: Weight bearing as tolerated LUE Weight Bearing: Non weight bearing RLE Weight Bearing: Weight bearing as tolerated LLE Weight Bearing: Weight bearing as tolerated Balance: Standardized Balance Assessment Standardized Balance Assessment: Timed Up and Go Test;Berg Balance Test Berg Balance Test Sit to Stand: Able to stand without using hands and stabilize independently Standing Unsupported: Able to stand safely 2 minutes Sitting with Back Unsupported but Feet Supported on Floor or Stool: Able to sit safely and securely 2 minutes Stand to Sit: Sits safely with minimal use of hands Transfers: Able to transfer safely, definite need of hands Standing Unsupported with Eyes Closed: Able to stand 10 seconds with supervision Standing Ubsupported with Feet Together: Needs help to attain position but able to stand for 30 seconds with feet together From Standing, Reach Forward with Outstretched Arm: Can reach forward >5 cm safely (2") From Standing Position, Pick up Object from Floor: Able to pick up shoe, needs supervision From Standing Position, Turn to Look Behind Over each Shoulder: Looks behind one side only/other side shows less weight shift Turn 360 Degrees: Able to turn 360 degrees safely but slowly Standing Unsupported, Alternately Place Feet on Step/Stool: Able to complete 4 steps without aid or supervision Standing Unsupported, One Foot in Front: Able to plae foot ahead of the other independently and hold 30 seconds Standing on One Leg: Tries to lift leg/unable to hold 3 seconds but remains standing independently Total Score: 39 Timed Up and Go Test TUG:  (5xSTS = 10 sec) Normal TUG (seconds): 17  See FIM for current functional status  Therapy/Group: Individual Therapy  Carlos Levering 12/04/2014, 8:36 AM

## 2014-12-04 NOTE — Progress Notes (Signed)
Social Work Patient ID: Edward Singleton, male   DOB: 1951-08-09, 64 y.o.   MRN: [id number]   Have spoken with pt's sister, [name] and with pt to review team conference.  Sister also able to speak with Dr. Naaman Plummer yesterday via phone.  Family with concerns about pt's cognitive status.  All agreed that d/c plan is changed to SNF, however, in team conference was directed by MD that pt not yet medically stable for transfer.  Have sent FL2 out to area facilities.  Will follow up with MD/ PA today to determine medical readiness at this point.  Will keep team posted.  Cristalle Rohm, LCSW

## 2014-12-04 NOTE — Progress Notes (Signed)
[facility]     PROGRESS NOTE    Subjective/Complaints: Patient without agitation, appreciative of the care that he has received and wants to volunteer post discharge A  review of systems has been performed and if not noted above is otherwise negative.   Objective: Vital Signs: Blood pressure 88/56, pulse 74, temperature 98.3 F (36.8 C), temperature source Oral, resp. rate 18, SpO2 100 %. No results found.  Recent Labs  12/03/14 0644 12/04/14 0605  WBC 16.6* 14.4*  HGB 8.9* 8.6*  HCT 27.8* 27.0*  PLT 991* 974*   No results for input(s): NA, K, CL, GLUCOSE, BUN, CREATININE, CALCIUM in the last 72 hours.  Invalid input(s): CO CBG (last 3)  No results for input(s): GLUCAP in the last 72 hours.  Wt Readings from Last 3 Encounters:  11/17/14 74.844 kg (165 lb)    Physical Exam:  Head: Normocephalic.  Eyes: EOM are normal.  Neck: Normal range of motion.  Cardiovascular: Normal rate and regular rhythm.  Respiratory: Effort normal and breath sounds normal. No respiratory distress.  GI: Soft. Bowel sounds are normal. He exhibits no distension.  Musculoskeletal:  Left upper extremity in splint  Neurological: He is alert.  Patient is oriented to person and hospital.  Skin: Skin is warm and dry.  Numerous scabs and scars on limbs, trunk. Incision intact Psychiatric:  No evidence of lability or agitation  Assessment/Plan: 1. Functional deficits secondary to polytrauma including pelvic fx's, left distal humerus fx which require 3+ hours per day of interdisciplinary therapy in a comprehensive inpatient rehab setting. Physiatrist is providing close team supervision and 24 hour management of active medical problems listed below. Physiatrist and rehab team continue to assess barriers to discharge/monitor patient progress toward functional and medical goals. FIM: FIM - Bathing Bathing Steps Patient Completed: Chest, Right Arm, Left Arm,  Abdomen, Front perineal area, Buttocks, Right upper leg, Left upper leg Bathing: 4: Min-Patient completes 8-9 44f 10 parts or 75+ percent  FIM - Upper Body Dressing/Undressing Upper body dressing/undressing steps patient completed: Thread/unthread right sleeve of pullover shirt/dresss, Put head through opening of pull over shirt/dress, Thread/unthread left sleeve of pullover shirt/dress, Pull shirt over trunk Upper body dressing/undressing: 5: Supervision: Safety issues/verbal cues FIM - Lower Body Dressing/Undressing Lower body dressing/undressing steps patient completed: Thread/unthread right underwear leg, Pull underwear up/down, Thread/unthread right pants leg, Pull pants up/down, Don/Doff right sock Lower body dressing/undressing: 4: Min-Patient completed 75 plus % of tasks  FIM - Toileting Toileting steps completed by patient: Performs perineal hygiene, Adjust clothing prior to toileting, Adjust clothing after toileting Toileting Assistive Devices: Grab bar or rail for support Toileting: 4: Steadying assist  FIM - Radio producer Devices: Grab bars Toilet Transfers: 4-From toilet/BSC: Min A (steadying Pt. > 75%), 4-To toilet/BSC: Min A (steadying Pt. > 75%)  FIM - Control and instrumentation engineer Devices: Arm rests, Best boy: 5: Chair or W/C > Bed: Supervision (verbal cues/safety issues), 5: Bed > Chair or W/C: Supervision (verbal cues/safety issues), 6: Sit > Supine: No assist, 6: Supine > Sit: No assist  FIM - Locomotion: Wheelchair Distance: 200 Locomotion: Wheelchair: 5: Travels 150 ft or more: maneuvers on rugs and over door sills with supervision, cueing or coaxing FIM - Locomotion: Ambulation Locomotion: Ambulation Assistive Devices: Journalist, newspaper Ambulation/Gait Assistance: 4: Min guard, 5: Supervision Locomotion: Ambulation: 4: Travels 150 ft or more with minimal assistance (Pt.>75%)  Comprehension Comprehension  Mode: Auditory Comprehension: 5-Understands basic  90% of the time/requires cueing < 10% of the time  Expression Expression Mode: Verbal Expression: 5-Expresses basic 90% of the time/requires cueing < 10% of the time.  Social Interaction Social Interaction: 5-Interacts appropriately 90% of the time - Needs monitoring or encouragement for participation or interaction.  Problem Solving Problem Solving: 5-Solves basic 90% of the time/requires cueing < 10% of the time  Memory Memory: 4-Recognizes or recalls 75 - 89% of the time/requires cueing 10 - 24% of the time  Medical Problem List and Plan: 1. Functional deficits secondary to multitrauma after a fall with left distal humerus fracture, pelvic fx's status post ORIF humerus fx. Nonweightbearing left upper extremity and splint was discontinued 11/26/2015. Left C1 pelvic ring fracture weightbearing as tolerated. 2. DVT Prophylaxis/Anticoagulation: Subcutaneous Lovenox. Check vascular study 3. Pain Management: Oxycodone as needed. Monitor with increased mobility 4. Mood/agitation: Xanax 0.5 mg tid.  -scheduled librium  -seroquel qhs agitatiion  -resume nicoderm patch 5. Neuropsych: This patient is not capable of making decisions on his own behalf. 6. Skin/Wound Care: Routine skin checks 7. Fluids/Electrolytes/Nutrition: encourage PO 8. History of alcohol abuse as well as illicit benzodiazepine use. 9. Hyperlipidemia. Zocor 10. History of gout. Monitor for any signs of flareup/continue allopurinol 11. Leukocytosis: down to 20.7k.   -remains is afebrile  -dopplers negative  -serial cbc's  -Ecoli UTI----continue amoxil for 7 days  -follow up cbc , leukocytosis resolving   LOS (Days) 9 A FACE TO FACE EVALUATION WAS PERFORMED  Alysia Penna E 12/04/2014 1:52 PM

## 2014-12-04 NOTE — Progress Notes (Signed)
Speech Language Pathology Daily Session Note  Patient Details  Name: Edward Singleton MRN: [id number] Date of Birth: [birth year]  Today's Date: 12/04/2014 SLP Individual Time: 0930-1000 SLP Individual Time Calculation (min): 30 min  Short Term Goals: Week 2: SLP Short Term Goal 1 (Week 2): Patient will orient to time and situation with Max A multimodal cueing. SLP Short Term Goal 2 (Week 2): Patient will demonstrate functional problem solving for basic daily situations with Max A multimodal cueing. SLP Short Term Goal 3 (Week 2): Patient will use external memory aids to recall new, daily information with Max  A multimodal cueing. SLP Short Term Goal 4 (Week 2): Patient will maintain sustained attention in a mildy distracting environment with Mod A multimodal cueing for ~10 minutes. SLP Short Term Goal 5 (Week 2): Patient will identify 2 physical and 2 cognitive imapirments with Max A multimodal cues. SLP Short Term Goal 6 (Week 2): Patient will utilize callbell to request assistance with Max A multimodal cues.  Skilled Therapeutic Interventions: Skilled treatment session focused on addressing cognitive goals. Upon arrival, patient on toilet with nursing student; SLP took over with Min assist transfer from toilet to wheelchair.  Patient finished grooming tasks at the sink with increased time to problem solve.  Patient required Min assist multimodal cues for identification and correction of basic math calculations during a cash money management task.  Patient also required Min assist question cues to recall events from previous therapy sessions and to decrease repetition of previously reported information.  Continue with current plan of care.   FIM:  Comprehension Comprehension Mode: Auditory Comprehension: 5-Understands basic 90% of the time/requires cueing < 10% of the time Expression Expression Mode: Verbal Expression: 5-Expresses basic 90% of the time/requires cueing < 10% of the  time. Social Interaction Social Interaction: 5-Interacts appropriately 90% of the time - Needs monitoring or encouragement for participation or interaction. Problem Solving Problem Solving: 5-Solves basic 90% of the time/requires cueing < 10% of the time Memory Memory: 4-Recognizes or recalls 75 - 89% of the time/requires cueing 10 - 24% of the time  Pain Pain Assessment Pain Assessment: No/denies pain  Therapy/Group: Individual Therapy  Carmelia Roller., CCC-SLP [id number]  [place] 12/04/2014, 1:00 PM

## 2014-12-04 NOTE — Progress Notes (Signed)
Occupational Therapy Session Note  Patient Details  Name: Edward Singleton MRN: [id number] Date of Birth: [birth year]  Today's Date: 12/04/2014 OT Individual Time: 1100-1145 OT Individual Time Calculation (min): 45 min  and Today's Date: 12/04/2014 OT Missed Time: 15 Minutes Missed Time Reason: Pain;Patient fatigue   Short Term Goals: Week 2:  OT Short Term Goal 1 (Week 2): STG=LTG secondary to ELOS  Skilled Therapeutic Interventions/Progress Updates:  Upon entering the room, pt supine in bed with no c/o pain this session. Pt reporting bathing and dressing completed prior to this session. However, pt agreeable to participate. Supine >sit with supervision and transfer to wheelchair with supervision. Pt donned L shoulder sling with assist to fasten. Pt propelled wheelchair ~75 feet with increased time and use of R UE and B feet. Pt with increased fatigue requiring frequent rest breaks and therapist assisting to propel wheelchair the rest of the way to elevator. Pt ambulated in gift shop with [facility] and steady assist secondary to fatigue and reporting increased fatigue and pain in lower back. Pt propelled in wheelchair back to floor and ambulated ~ 100 feet back to room with [facility] and close supervision. Ambulated into bathroom and toilet transfer with steady assist and steady assist for clothing management as well. Pt returned to bed secondary to refusal to continue with back pain stated to be 9/10. RN notified but pain medications not due at this time. Sit >supine with supervision and bed alarm set for pt safety prior to exiting the room. Pt required multiple verbal cues for NWB once shoulder sling removed.   Therapy Documentation Precautions:  Precautions Precautions: Fall Required Braces or Orthoses: Other Brace/Splint, Sling Other Brace/Splint: LUE cast Restrictions Weight Bearing Restrictions: Yes RUE Weight Bearing: Weight bearing as tolerated LUE Weight Bearing: Non weight bearing RLE Weight  Bearing: Weight bearing as tolerated LLE Weight Bearing: Weight bearing as tolerated General: General OT Amount of Missed Time: 15 Minutes   See FIM for current functional status  Therapy/Group: Individual Therapy  Phineas Semen 12/04/2014, 2:38 PM

## 2014-12-04 NOTE — Progress Notes (Signed)
NUTRITION FOLLOW UP  Pt meets criteria for NON-SEVERE (MODERATE) MALNUTRITION in the context of chronic illness as evidenced by moderate fat and muscle mass loss.  DOCUMENTATION CODES Per approved criteria  -Non-severe (moderate) malnutrition in the context of chronic illness   INTERVENTION: Continue Ensure Complete po BID, each supplement provides 350 kcal and 13 grams of protein.  Encourage adequate PO intake.  Recommend obtaining new weight to fully assess weight trends.  NUTRITION DIAGNOSIS: Increased nutrient needs related to therapy as evidenced by estimated nutrition needs; ongoing  Goal: Pt to meet >/= 90% of their estimated nutrition needs; met  Monitor:  PO intake, weight trends, labs, I/O's  64 y.o. male  Admitting Dx: Multiple stable closed lateral compression fractures of pelvis  ASSESSMENT: Pt with a fall while at home. Patient does have a history of alcohol abuse as well as illicit benzodiazepine use. x-rays and imaging showed mildly displaced fractures of left obturator ring fx involving the left inferior pubic ramus and puboacetabular junction as well as displaced left supracondylar humerus fracture.  Pt reports his appetite is fine. Meal completion has been varied from 10-100%. Breakfast this AM was 80%. Pt has been consuming his Ensure drinks. Will continue with current orders. Pt was encouraged to eat his food at meals and to drink his supplements.   Labs and medications reviewed.  Height: Ht Readings from Last 1 Encounters:  11/17/14 $RemoveB'5\' 7"'NONEOVZw$  (1.702 m)    Weight: Wt Readings from Last 1 Encounters:  11/17/14 165 lb (74.844 kg)    BMI: Body Mass Index: 25.88 kg/(m^2)  Re-Estimated Nutritional Needs: Kcal: 1850-2050 Protein: 85-100 grams Fluid: 1.85 - 2 L/day  Skin: Incision on left arm, non-pitting LUE edema  Diet Order: Diet regular   Intake/Output Summary (Last 24 hours) at 12/04/14 1343 Last data filed at 12/04/14 0900  Gross per 24 hour   Intake    600 ml  Output    600 ml  Net      0 ml    Last BM: 2/24  Labs:  No results for input(s): NA, K, CL, CO2, BUN, CREATININE, CALCIUM, MG, PHOS, GLUCOSE in the last 168 hours.  CBG (last 3)  No results for input(s): GLUCAP in the last 72 hours.  Scheduled Meds: . allopurinol  150 mg Oral Daily  . ALPRAZolam  0.5 mg Oral TID  . amoxicillin  500 mg Oral 3 times per day  . chlordiazePOXIDE  10 mg Oral TID  . docusate sodium  100 mg Oral BID  . enoxaparin (LOVENOX) injection  40 mg Subcutaneous Q24H  . feeding supplement (ENSURE COMPLETE)  237 mL Oral BID BM  . folic acid  1 mg Oral Daily  . multivitamin with minerals  1 tablet Oral Daily  . nicotine  14 mg Transdermal Daily  . polyethylene glycol  17 g Oral Daily  . QUEtiapine  100 mg Oral QHS  . simvastatin  40 mg Oral Q2000  . thiamine  100 mg Oral Daily    Continuous Infusions:   Past Medical History  Diagnosis Date  . Hypertension   . Hypercholesteremia   . Gout     Past Surgical History  Procedure Laterality Date  . Nephrectomy    . Orif humerus fracture Left 11/18/2014    Procedure: OPEN REDUCTION INTERNAL FIXATION (ORIF) DISTAL HUMERUS FRACTURE;  Surgeon: Rozanna Box, MD;  Location: [facility];  Service: Orthopedics;  Laterality: Left;    Kallie Locks, MS, RD, LDN Pager # [phone]  After hours/ weekend pager # [phone]

## 2014-12-05 ENCOUNTER — Inpatient Hospital Stay (HOSPITAL_COMMUNITY): Payer: 59 | Admitting: Speech Pathology

## 2014-12-05 ENCOUNTER — Inpatient Hospital Stay (HOSPITAL_COMMUNITY): Payer: 59 | Admitting: Occupational Therapy

## 2014-12-05 ENCOUNTER — Inpatient Hospital Stay (HOSPITAL_COMMUNITY): Payer: 59

## 2014-12-05 NOTE — Progress Notes (Signed)
Physical Therapy Session Note  Patient Details  Name: Edward Singleton MRN: [id number] Date of Birth: 1951/03/20  Today's Date: 12/05/2014 PT Individual Time: 0800-0900 PT Individual Time Calculation (min): 60 min   Short Term Goals: Week 2:  PT Short Term Goal 1 (Week 2): STG = LTG due to estimated length of stay  Skilled Therapeutic Interventions/Progress Updates:  1:1. Pt received seated EOB, willing and ready to participate in physical therapy. Session focused on functional transfers and ambulation, stair negotiation, and supine LE strengthening, and cognitive remediation. Pt performed all bed mobility throughout session at mod(I) level, and all stand pivot transfers at overall supervision with use of SPC. Pt propelled 200'x1 with use of B LE and R UE with overall supervision and ambulated 200'x1 with supervision - min guard with SPC with antalgic gait at a significantly decreased speed. Pt requested to use the bathroom and transported to/from room, transferred to/from toilet with steadying assist and use of grab bar.  Pt t/f w/c<>treatment mat, performed bed mobility at mod(I) level and completed side-lying/supine exercises 2 sets x20 of: hip abduction against gravity, clamshells and sprinters exercise (full knee and hip flexion while slightly abducted in slide-lying), supine SLR. Pt continues to have difficulty raising L foot off bench due to decrease hip strength and increased pelvic pain.  Pt maintained NWB status of L UE throughout session with occasional min cues. Pt reported 5/10 pain at beginning of session, RN notified. Pt left seated in w/c at RN station, RN aware.  Therapy Documentation Precautions:  Precautions Precautions: Fall Required Braces or Orthoses: Other Brace/Splint, Sling Other Brace/Splint: LUE cast Restrictions Weight Bearing Restrictions: Yes RUE Weight Bearing: Weight bearing as tolerated LUE Weight Bearing: Non weight bearing RLE Weight Bearing: Weight  bearing as tolerated LLE Weight Bearing: Weight bearing as tolerated Pain: Pain Assessment Pain Assessment: 0-10 Pain Score: 5  Pain Type: Acute pain Pain Location: Arm Pain Orientation: Left Pain Descriptors / Indicators: Aching Pain Onset: On-going Pain Intervention(s): Repositioned;RN made aware;Distraction;Rest Multiple Pain Sites: No  See FIM for current functional status  Therapy/Group: Individual Therapy  Carlos Levering 12/05/2014, 8:32 AM

## 2014-12-05 NOTE — Progress Notes (Signed)
Occupational Therapy Session Note  Patient Details  Name: ARTAVIUS GANTER MRN: [id number] Date of Birth: Aug 30, 1951  Today's Date: 12/05/2014 OT Individual Time: 1100-1200 OT Individual Time Calculation (min): 60 min    Short Term Goals: Week 2:  OT Short Term Goal 1 (Week 2): STG=LTG secondary to ELOS  Skilled Therapeutic Interventions/Progress Updates: Pt transitioned easily from SLP session with 8/10 c/o pain in lower back and L arm during session. RN notified but pain medication given 1 hour prior to session. Pt agreeable to participation in bathing and dressing at shower level this session. Pt seated in wheelchair with Mod I for grooming tasks at sink included shaving face. Pt ambulated with SPC and steady assist into bathroom to sit onto shower seat to wash at shower level. L UE covered during shower. Bathing performed with supervision and use of LH sponge for increased independence and safety. Pt ambulating to sit in wheelchair for dressing at sink side. Pt required steady assist for balance during clothing management. Pt requiring max verbal cues to maintain NWB on L UE during self care tasks. Pt returning to supine in bed secondary to increased pain in lower back. Sit >supine with close supervision and bed alarm activated with call bell within reach.   Therapy Documentation Precautions:  Precautions Precautions: Fall Required Braces or Orthoses: Other Brace/Splint, Sling Other Brace/Splint: LUE cast Restrictions Weight Bearing Restrictions: Yes RUE Weight Bearing: Weight bearing as tolerated LUE Weight Bearing: Non weight bearing RLE Weight Bearing: Weight bearing as tolerated LLE Weight Bearing: Weight bearing as tolerated Pain: Pain Assessment Pain Assessment: No/denies pain Pain Score: 8  Pain Location: Arm Pain Orientation: Left Pain Descriptors / Indicators: Aching Patients Stated Pain Goal: 5 Pain Intervention(s): Rest, repositioned, and RN notified  See FIM for  current functional status  Therapy/Group: Individual Therapy  Phineas Semen 12/05/2014, 12:52 PM

## 2014-12-05 NOTE — Progress Notes (Signed)
Speech Language Pathology Daily Session Note  Patient Details  Name: Edward Singleton MRN: [id number] Date of Birth: 11/23/1950  Today's Date: 12/05/2014 SLP Individual Time: 1000-1100 SLP Individual Time Calculation (min): 60 min  Short Term Goals: Week 2: SLP Short Term Goal 1 (Week 2): Patient will orient to time and situation with Max A multimodal cueing. SLP Short Term Goal 2 (Week 2): Patient will demonstrate functional problem solving for basic daily situations with Max A multimodal cueing. SLP Short Term Goal 3 (Week 2): Patient will use external memory aids to recall new, daily information with Max  A multimodal cueing. SLP Short Term Goal 4 (Week 2): Patient will maintain sustained attention in a mildy distracting environment with Mod A multimodal cueing for ~10 minutes. SLP Short Term Goal 5 (Week 2): Patient will identify 2 physical and 2 cognitive imapirments with Max A multimodal cues. SLP Short Term Goal 6 (Week 2): Patient will utilize callbell to request assistance with Max A multimodal cues.  Skilled Therapeutic Interventions: Skilled treatment session focused on cognitive goals. Upon arrival, patient was at nurse's station in wheelchair with quick-release belt on. Student facilitated session with Mod A multimodal cues for problem-solving and short-term recall related to mildly complex medication organization task. As session progressed, student was able to fade to Min A multimodal cues for the aforementioned areas. Patient required Min A multimodal cues to refer to external aid to assist in short-term recall and Mod A multimodal cues for recall of new daily information relating to previous therapy sessions. Patient demonstrated emerging awareness of physical deficits by stating "I don't think I'll be able to go back to work because I'm so weak"; student further facilitated session by providing Mod A question cues for emerging awareness of cogntive deficits. Patient handed off to OT.  Continue with current plan of care.  FIM:  Comprehension Comprehension Mode: Auditory Comprehension: 5-Understands basic 90% of the time/requires cueing < 10% of the time Expression Expression Mode: Verbal Expression: 5-Expresses basic 90% of the time/requires cueing < 10% of the time. Social Interaction Social Interaction: 5-Interacts appropriately 90% of the time - Needs monitoring or encouragement for participation or interaction. Problem Solving Problem Solving: 4-Solves basic 75 - 89% of the time/requires cueing 10 - 24% of the time Memory Memory: 3-Recognizes or recalls 50 - 74% of the time/requires cueing 25 - 49% of the time FIM - Eating Eating Activity: 6: More than reasonable amount of time  Pain Pain Assessment Pain Assessment: No/denies pain  Therapy/Group: Individual Therapy  Servando Snare 12/05/2014, 11:26 AM

## 2014-12-05 NOTE — Progress Notes (Signed)
Social Work Patient ID: Edward Singleton, male   DOB: October 02, 1951, 64 y.o.   MRN: [id number]   Received SNF offers today from [facility] and [facility] who can admit pt on Monday.  Pt and two sisters made aware and to choose facility over the weekend.  Tx team alerted.  Dimonique Bourdeau, LCSW

## 2014-12-05 NOTE — Progress Notes (Signed)
[place] PHYSICAL MEDICINE & REHABILITATION     PROGRESS NOTE    Subjective/Complaints: Pt with out new issues, still left side rib pain A  review of systems has been performed and if not noted above is otherwise negative.   Objective: Vital Signs: Blood pressure 95/63, pulse 83, temperature 98.3 F (36.8 C), temperature source Oral, resp. rate 19, SpO2 93 %. No results found.  Recent Labs  12/03/14 0644 12/04/14 0605  WBC 16.6* 14.4*  HGB 8.9* 8.6*  HCT 27.8* 27.0*  PLT 991* 974*   No results for input(s): NA, K, CL, GLUCOSE, BUN, CREATININE, CALCIUM in the last 72 hours.  Invalid input(s): CO CBG (last 3)  No results for input(s): GLUCAP in the last 72 hours.  Wt Readings from Last 3 Encounters:  11/17/14 74.844 kg (165 lb)    Physical Exam:  Head: Normocephalic.  Eyes: EOM are normal.  Neck: Normal range of motion.  Cardiovascular: Normal rate and regular rhythm.  Respiratory: Effort normal and breath sounds normal. No respiratory distress.  GI: Soft. Bowel sounds are normal. He exhibits no distension.  Musculoskeletal:  Left upper extremity in splint  Neurological: He is alert.  Patient is oriented to person and hospital.  Skin: Skin is warm and dry.  Numerous scabs and scars on limbs, trunk. Incision intact Psychiatric:  No evidence of lability or agitation  Assessment/Plan: 1. Functional deficits secondary to polytrauma including pelvic fx's, left distal humerus fx which require 3+ hours per day of interdisciplinary therapy in a comprehensive inpatient rehab setting. Physiatrist is providing close team supervision and 24 hour management of active medical problems listed below. Physiatrist and rehab team continue to assess barriers to discharge/monitor patient progress toward functional and medical goals. FIM: FIM - Bathing Bathing Steps Patient Completed: Chest, Right Arm, Left Arm, Abdomen, Front perineal area, Buttocks, Right upper leg, Left upper  leg, Right lower leg (including foot), Left lower leg (including foot) Bathing: 5: Supervision: Safety issues/verbal cues  FIM - Upper Body Dressing/Undressing Upper body dressing/undressing steps patient completed: Thread/unthread right sleeve of pullover shirt/dresss, Put head through opening of pull over shirt/dress, Thread/unthread left sleeve of pullover shirt/dress, Pull shirt over trunk Upper body dressing/undressing: 5: Supervision: Safety issues/verbal cues FIM - Lower Body Dressing/Undressing Lower body dressing/undressing steps patient completed: Thread/unthread right underwear leg, Pull underwear up/down, Thread/unthread right pants leg, Pull pants up/down, Don/Doff right sock, Thread/unthread left underwear leg, Thread/unthread left pants leg, Don/Doff left sock Lower body dressing/undressing: 4: Steadying Assist  FIM - Toileting Toileting steps completed by patient: Adjust clothing prior to toileting, Performs perineal hygiene, Adjust clothing after toileting Toileting Assistive Devices: Grab bar or rail for support Toileting: 4: Steadying assist  FIM - Radio producer Devices: Grab bars Toilet Transfers: 4-From toilet/BSC: Min A (steadying Pt. > 75%), 4-To toilet/BSC: Min A (steadying Pt. > 75%)  FIM - Control and instrumentation engineer Devices: Arm rests, Best boy: 5: Chair or W/C > Bed: Supervision (verbal cues/safety issues), 5: Bed > Chair or W/C: Supervision (verbal cues/safety issues), 6: Sit > Supine: No assist, 6: Supine > Sit: No assist  FIM - Locomotion: Wheelchair Distance: 200 Locomotion: Wheelchair: 5: Travels 150 ft or more: maneuvers on rugs and over door sills with supervision, cueing or coaxing FIM - Locomotion: Ambulation Locomotion: Ambulation Assistive Devices: Journalist, newspaper Ambulation/Gait Assistance: 4: Min guard, 5: Supervision Locomotion: Ambulation: 4: Travels 150 ft or more with minimal  assistance (Pt.>75%)  Comprehension Comprehension Mode:  Auditory Comprehension: 5-Understands basic 90% of the time/requires cueing < 10% of the time  Expression Expression Mode: Verbal Expression: 5-Expresses basic 90% of the time/requires cueing < 10% of the time.  Social Interaction Social Interaction: 5-Interacts appropriately 90% of the time - Needs monitoring or encouragement for participation or interaction.  Problem Solving Problem Solving: 4-Solves basic 75 - 89% of the time/requires cueing 10 - 24% of the time  Memory Memory: 3-Recognizes or recalls 50 - 74% of the time/requires cueing 25 - 49% of the time  Medical Problem List and Plan: 1. Functional deficits secondary to multitrauma after a fall with left distal humerus fracture, pelvic fx's status post ORIF humerus fx. Nonweightbearing left upper extremity and splint was discontinued 11/26/2015. Left C1 pelvic ring fracture weightbearing as tolerated. 2. DVT Prophylaxis/Anticoagulation: Subcutaneous Lovenox. Check vascular study 3. Pain Management: Oxycodone as needed. Monitor with increased mobility 4. Mood/agitation: Xanax 0.5 mg tid.  -scheduled librium  -seroquel qhs agitatiion  -resume nicoderm patch 5. Neuropsych: This patient is not capable of making decisions on his own behalf. 6. Skin/Wound Care: Routine skin checks 7. Fluids/Electrolytes/Nutrition: encourage PO 8. History of alcohol abuse as well as illicit benzodiazepine use. 9. Hyperlipidemia. Zocor 10. History of gout. Monitor for any signs of flareup/continue allopurinol 11. Leukocytosis: down to 14K   -remains is afebrile  -dopplers negative  -serial cbc's  -Ecoli UTI----continue amoxil for 7 days    LOS (Days) 10 A FACE TO FACE EVALUATION WAS PERFORMED  Charlett Blake 12/05/2014 2:39 PM

## 2014-12-06 ENCOUNTER — Inpatient Hospital Stay (HOSPITAL_COMMUNITY): Payer: 59 | Admitting: Physical Therapy

## 2014-12-06 NOTE — Progress Notes (Signed)
Edward Singleton is a 64 y.o. male 18-May-1951 [id number]  Subjective: No new complaints. "Pain all over whole left side" No other new problems. Slept poorly but feeling OK.  Objective: Vital signs in last 24 hours: Temp:  [97.6 F (36.4 C)-98.4 F (36.9 C)] 97.6 F (36.4 C) (02/27 0610) Pulse Rate:  [79-88] 79 (02/27 0610) Resp:  [16-18] 16 (02/27 0610) BP: (98-122)/(57-74) 98/57 mmHg (02/27 0610) SpO2:  [92 %-97 %] 97 % (02/27 0610) Weight change:  Last BM Date: 12/05/14  Intake/Output from previous day: 02/26 0701 - 02/27 0700 In: 1440 [P.O.:1440] Out: 725 [Urine:725]  Physical Exam General: No apparent distress    Lungs: Normal effort. Lungs clear to auscultation, no crackles or wheezes. Cardiovascular: Regular rate and rhythm, no edema Neurological: No new neurological deficits Wounds: dressed - Clean, dry, intact. No signs of infection.  Lab Results: BMET    Component Value Date/Time   NA 131* 11/26/2014 0732   K 3.7 11/26/2014 0732   CL 95* 11/26/2014 0732   CO2 27 11/26/2014 0732   GLUCOSE 135* 11/26/2014 0732   BUN 10 11/26/2014 0732   CREATININE 1.34 11/26/2014 0732   CALCIUM 8.2* 11/26/2014 0732   GFRNONAA 55* 11/26/2014 0732   GFRAA 64* 11/26/2014 0732   CBC    Component Value Date/Time   WBC 14.4* 12/04/2014 0605   RBC 2.61* 12/04/2014 0605   RBC 2.07* 11/17/2014 2009   HGB 8.6* 12/04/2014 0605   HCT 27.0* 12/04/2014 0605   PLT 974* 12/04/2014 0605   MCV 103.4* 12/04/2014 0605   MCH 33.0 12/04/2014 0605   MCHC 31.9 12/04/2014 0605   RDW 16.5* 12/04/2014 0605   LYMPHSABS 1.7 11/30/2014 1255   MONOABS 2.9* 11/30/2014 1255   EOSABS 0.4 11/30/2014 1255   BASOSABS 0.0 11/30/2014 1255   CBG's (last 3):  No results for input(s): GLUCAP in the last 72 hours. LFT's Lab Results  Component Value Date   ALT 32 11/26/2014   AST 59* 11/26/2014   ALKPHOS 177* 11/26/2014   BILITOT 1.2 11/26/2014    Studies/Results: No results  found.  Medications:  I have reviewed the patient's current medications. Scheduled Medications: . allopurinol  150 mg Oral Daily  . ALPRAZolam  0.5 mg Oral TID  . amoxicillin  500 mg Oral 3 times per day  . chlordiazePOXIDE  10 mg Oral TID  . docusate sodium  100 mg Oral BID  . enoxaparin (LOVENOX) injection  40 mg Subcutaneous Q24H  . feeding supplement (ENSURE COMPLETE)  237 mL Oral BID BM  . folic acid  1 mg Oral Daily  . multivitamin with minerals  1 tablet Oral Daily  . nicotine  14 mg Transdermal Daily  . polyethylene glycol  17 g Oral Daily  . QUEtiapine  100 mg Oral QHS  . simvastatin  40 mg Oral Q2000  . thiamine  100 mg Oral Daily   PRN Medications: acetaminophen, ipratropium-albuterol, ondansetron **OR** ondansetron (ZOFRAN) IV, oxyCODONE, sorbitol, zolpidem  Assessment/Plan: Principal Problem:   Multiple stable closed lateral compression fractures of pelvis, Left LC1 pelvic ring fx  Active Problems:   Closed fracture of left distal humerus   Acute blood loss anemia   Bacterial UTI   Memory loss  Medical Problem List and Plan: 1. Functional deficits secondary to multitrauma after a fall with left distal humerus fracture, pelvic fx's status post ORIF humerus fx. Nonweightbearing left upper extremity and splint was discontinued 11/26/2015. Left C1 pelvic ring fracture weightbearing as tolerated.  2. DVT Prophylaxis/Anticoagulation: Subcutaneous Lovenox. Check vascular study 3. Pain Management: Oxycodone as needed. Monitor with increased mobility 4. Mood/agitation: Xanax 0.5 mg tid. -scheduled librium -seroquel qhs agitatiion -resume nicoderm patch 5. Neuropsych: This patient is not capable of making decisions on his own behalf. 6. Skin/Wound Care: Routine skin checks 7. Fluids/Electrolytes/Nutrition: encourage PO 8. History of alcohol abuse as well as illicit benzodiazepine use. 9. Hyperlipidemia. Zocor 10. History of gout.  Monitor for any signs of flareup. continue allopurinol 11. Leukocytosis: down to 14K  -remains is afebrile -dopplers negative -serial cbc's 12. Ecoli UTI----continue amoxil for total 7 days  Length of stay, days: 11   Cicily Bonano A. Asa Lente, MD 12/06/2014, 9:50 AM

## 2014-12-06 NOTE — Progress Notes (Signed)
Physical Therapy Session Note  Patient Details  Name: Edward Singleton MRN: [id number] Date of Birth: 11-Apr-1951  Today's Date: 12/06/2014 PT Individual Time: 1100-1200 PT Individual Time Calculation (min): 60 min   Short Term Goals: Week 2:  PT Short Term Goal 1 (Week 2): STG = LTG due to estimated length of stay  Skilled Therapeutic Interventions/Progress Updates:    Therapeutic Activity: Pt received in bed with alarm on - PT instructs pt in supine to sit transfer req SBA and verbal cues to avoid use of L arm, as PT notes pt is bringing it close to bedside table. Pt requests to brush his teeth. PT instructs pt in stand-step transfer bed to w/c with SPC req CGA for safety and verbal cues for hand placement.  Pt demonstrates ability to brush teeth from w/c level mod I, as all items needed are within reach.  Pt requests to wash his face and PT provides set up assist to bring pt a towel and pt impulsively stands and PT provides SBA for safety due to pt's balance impairment.  PT hands pt his sling and he dons sling in sit with verbal cues for technique (pt tries to put it on backwards first try).  Pt requests to toilet prior to leaving the room with [facility] - PT provides CGA for the transfer and pt req assist for pants up & down due to pt impulsively trying to get L arm out of sling to assist.   Gait Training: PT instructs pt in ambulation with SPC (L arm in sling) x 250' req mostly CGA-min A - one LOB req tot A to prevent a fall when [facility] got in front of R foot and he tripped.  PT instructs pt in ascending/descending 12 stairs (height - 6.5 inches) with one rail req CGA-min A and verbal cues for technique (up with R, down with L leg) - no LOB noted.   Neuromuscular Reeducation: PT instructs pt in 3 trials of TUG and pt scores: 16 seconds, 14 seconds, and 12 seconds - done with SPC and CGA for safety due to LOB when ambulating to gym (see above).  PT instructs pt in toe tapping exercise on 6" step x  10 reps each LE req CGA for safety.   Therapeutic Exercise: PT instructs pt on Nu-Step for endurance at L3 x 8 minutes with B LEs - focus on endurance/activity tolerance. PT instructs pt to take a break as needed if fatigued or if pain increases.   Pt noted to be impulsive throughout PT session. PT sees pt lock his L brake with the extender and PT explains to pt that this brake extender is so he can reach the brake with his R hand. PT recommends that pt have sisters stay with him for 24 hour supervision at d/c to remind him not to use L arm so that it heals well. Although pt's TUG reflects 12 seconds, indicating reduced risk of falling, clinical observation indicates that pt continues to be at risk for falls due to major LOB when ambulating to gym with PT (see above). Continue per PT POC.   Therapy Documentation Precautions:  Precautions Precautions: Fall Required Braces or Orthoses: Other Brace/Splint, Sling Other Brace/Splint: LUE cast Restrictions Weight Bearing Restrictions: Yes RUE Weight Bearing: Weight bearing as tolerated LUE Weight Bearing: Non weight bearing RLE Weight Bearing: Weight bearing as tolerated LLE Weight Bearing: Weight bearing as tolerated Pain: Pain Assessment Pain Assessment: 0-10 Pain Score: 8  Pain Type: Acute pain  Pain Location: Generalized Pain Orientation: Left (L side of body) Pain Descriptors / Indicators: Sharp Pain Frequency: Constant Pain Onset: On-going Patients Stated Pain Goal: 5 Pain Intervention(s): RN made aware Multiple Pain Sites: No  Balance: Balance Balance Assessed: Yes Standardized Balance Assessment Standardized Balance Assessment: Timed Up and Go Test Timed Up and Go Test TUG: Normal TUG Normal TUG (seconds): 12  See FIM for current functional status  Therapy/Group: Individual Therapy  Judithann Villamar M 12/06/2014, 11:06 AM

## 2014-12-07 ENCOUNTER — Inpatient Hospital Stay (HOSPITAL_COMMUNITY): Payer: 59 | Admitting: *Deleted

## 2014-12-07 ENCOUNTER — Inpatient Hospital Stay (HOSPITAL_COMMUNITY): Payer: Self-pay | Admitting: Occupational Therapy

## 2014-12-07 ENCOUNTER — Encounter (HOSPITAL_COMMUNITY): Payer: Self-pay | Admitting: Occupational Therapy

## 2014-12-07 NOTE — Discharge Summary (Signed)
Discharge summary job # [id number]

## 2014-12-07 NOTE — Progress Notes (Addendum)
Physical Therapy Session Note  Patient Details  Name: Edward Singleton MRN: [id number] Date of Birth: Apr 26, 1951  Today's Date: 12/07/2014 PT Individual Time: 1500-1527 PT Individual Time Calculation (min): 27 min   Short Term Goals: Week 2:  PT Short Term Goal 1 (Week 2): STG = LTG due to estimated length of stay  Skilled Therapeutic Interventions/Progress Updates:    Patient received sitting EOM, finishing session with OT. Session focused on re-assessment of TUG, BERG Balance Test, and Five Time Sit to Stand in preparation for discharge to SNF tomorrow, 12/08/14; see details below. Patient required max encouragement to participate secondary to 8/10 pain in L flank. Patient continues to require mod cues to maintain NWB status of L UE. Patient returned to room at end of session and left supine in bed with all needs within reach, sister arriving as therapist leaving.  Five times Sit to Stand Test (FTSS) Method: Use a straight back chair with a solid seat that is 16-18" high. Ask participant to sit on the chair with arms folded across their chest.   Instructions: "Stand up and sit down as quickly as possible 5 times, keeping your arms folded across your chest."   Measurement: Stop timing when the participant stands the 5th time.  TIME: _7.69_____ (in seconds)  Times > 13.6 seconds is associated with increased disability and morbidity (Guralnik, 2000) Times > 15 seconds is predictive of recurrent falls in healthy individuals aged 29 and older (Buatois, et al., 2008) Normal performance values in community dwelling individuals aged 70 and older (Bohannon, 2006): o 60-69 years: 11.4 seconds o 70-79 years: 12.6 seconds o 80-89 years: 14.8 seconds  MCID: ? 2.3 seconds for Vestibular Disorders (Meretta, 2006)  FTSS time upon initial evaluation: 10" with SPC. Patient able to perform this time without B UEs. Patient's score increased 2.31", indicating MCID.   Therapy Documentation Precautions:   Precautions Precautions: Fall Required Braces or Orthoses: Other Brace/Splint, Sling Other Brace/Splint: LUE cast Restrictions Weight Bearing Restrictions: Yes RUE Weight Bearing: Weight bearing as tolerated LUE Weight Bearing: Non weight bearing RLE Weight Bearing: Weight bearing as tolerated LLE Weight Bearing: Weight bearing as tolerated General: PT Amount of Missed Time (min): 33 Minutes PT Missed Treatment Reason: Pain (L flank) Locomotion : Ambulation Ambulation/Gait Assistance: 5: Supervision  Balance: Balance Balance Assessed: Yes Standardized Balance Assessment Standardized Balance Assessment: Berg Balance Test;Timed Up and Go Test Berg Balance Test Sit to Stand: Able to stand without using hands and stabilize independently Standing Unsupported: Able to stand safely 2 minutes Sitting with Back Unsupported but Feet Supported on Floor or Stool: Able to sit safely and securely 2 minutes Stand to Sit: Sits safely with minimal use of hands Transfers: Able to transfer safely, definite need of hands Standing Unsupported with Eyes Closed: Able to stand 10 seconds safely Standing Ubsupported with Feet Together: Able to place feet together independently and stand 1 minute safely From Standing, Reach Forward with Outstretched Arm: Can reach forward >5 cm safely (2") From Standing Position, Pick up Object from Floor: Able to pick up shoe safely and easily From Standing Position, Turn to Look Behind Over each Shoulder: Looks behind one side only/other side shows less weight shift Turn 360 Degrees: Able to turn 360 degrees safely but slowly Standing Unsupported, Alternately Place Feet on Step/Stool: Able to stand independently and complete 8 steps >20 seconds Standing Unsupported, One Foot in Front: Able to plae foot ahead of the other independently and hold 30 seconds Standing on One  Leg: Able to lift leg independently and hold > 10 seconds Total Score: 48/56 indicating patient is  at moderate risk for falls (>50%). BERG Balance Test on Eval: 39/56 Timed Up and Go Test TUG: Normal TUG Normal TUG (seconds): 13.77, scores >13.5" indicate patient is at increased risk for falls. TUG score on Eval: 17"  See FIM for current functional status  Therapy/Group: Individual Therapy  Lillia Abed. Newt Levingston, PT, DPT 12/07/2014, 3:29 PM

## 2014-12-07 NOTE — Discharge Summary (Signed)
Edward Singleton, GASSETT NO.:  [id number]  MEDICAL RECORD NO.:  [id number]  LOCATION:  [id number]                        FACILITY:  [facility]  PHYSICIAN:  Meredith Staggers, M.D.DATE OF BIRTH:  07/26/1951  DATE OF ADMISSION:  11/24/2014 DATE OF DISCHARGE:  12/08/2014                              DISCHARGE SUMMARY   DISCHARGE DIAGNOSES: 1. Functional deficits secondary to multitrauma after fall with left     distal humerus fracture, pelvic fracture with ORIF. 2. Subcutaneous Lovenox for DVT prophylaxis. 3. Pain management. 4. Agitation and restlessness. 5. History of alcohol abuse as well as tobacco abuse. 6. History of gout. 7. E. coli urinary tract infection.  HISTORY OF PRESENT ILLNESS:  This is a 64 year old right-handed male admitted on November 17, 2014, after a fall while at home.  Patient does have a history of alcohol abuse as well as illicit benzodiazepine use, and tobacco abuse.  Independent prior to admission, living alone, and still working.  Cranial CT scan showed no acute intracranial abnormalities.  There was a small right frontal scalp hematoma.  Alcohol level was negative on admission.  CT cervical spine negative.  X-rays and imaging showed a mildly displaced fracture of the left obturator ring fracture involving the left inferior pubic ramus and puboacetabular junction as well as displaced left supracondylar humerus fracture. Orthopedic Services consulted, underwent ORIF of the left distal humerus fracture on 11/18/2014 per Dr. Marcelino Scot.  The patient is nonweightbearing, left upper extremity.  Weightbearing as tolerated, lower extremities.  HOSPITAL COURSE PAIN MANAGEMENT:  Acute blood loss anemia 7.3, was transfused.  Issues in regard of bouts of agitation, restlessness, suspect alcohol withdrawal with protocol implemented.  Physical and occupational therapy ongoing.  The patient was admitted for comprehensive rehab program.  PAST MEDICAL HISTORY:  See  discharge diagnoses.  SOCIAL HISTORY:  Lives alone, independent prior to admission. Functional status upon admission to rehab services was +2 physical assist supine to sit, +2 physical assist sit to supine as well as stand pivot transfers, min to mod assist activities of daily living.  PHYSICAL EXAMINATION:  VITAL SIGNS:  Blood pressure 116/73, pulse 97, temperature 98, respirations 18. GENERAL:  This was an alert male.  He was oriented to person but not place and time.  He did follow simple commands.  Appeared somewhat anxious during exam with decreased insight and awareness of his deficits. LUNGS:  Clear to auscultation. CARDIAC:  Regular rate and rhythm. ABDOMEN:  Soft, nontender.  Good bowel sounds.  Left upper extremity with a sling in place.  REHABILITATION HOSPITAL COURSE:  The patient was admitted to inpatient rehab services with therapies initiated on a 3-hour daily basis consisting of physical therapy, occupational therapy, speech therapy, and rehabilitation nursing.  The following issues were addressed during the patient's rehabilitation stay.  Pertaining to Mr. Reindl's multiple trauma after a fall, he had undergone ORIF of left distal humerus fracture, nonweightbearing, he would follow up with Orthopedics Services.  He was weightbearing as tolerated for pelvic ring fracture. Neurovascular sensation intact.  He remained on subcutaneous Lovenox for DVT prophylaxis.  Venous Doppler studies negative.  Pain management with the use of oxycodone and monitored closely.  He  did have a long history as noted by his family of alcohol abuse as well as illicit benzodiazepine use and tobacco abuse.  He remained on Librium as well as thiamine and monitored for any withdrawal.  A Nicoderm patch was initiated for his tobacco use, this had been discussed at length to be maintained with family.  He did have a history of gout, he exhibited no signs of overload or gout flare up during his  hospital stay.  The patient received weekly collaborative interdisciplinary team conferences to discuss estimated length of stay, family teaching, any barriers to his discharge.  The patient demonstrates ability to brush his teeth and wheelchair level modified independence.  Sling to left upper extremity. He can ambulate 250 feet with left arm in sling at contact guard assist, monitoring for loss of balance.  He needed some cues for his safety. Activities of daily living, he completes oral grooming at sink level. Shower task and transfer required supervision and use of grab bars. Dynamic balance and shower good.  Requires intermittent cues to maintain nonweightbearing left upper extremity.  He did require minimal assist for cues for identification and correction of basic math calculations during a cash money management task.  Minimal assist to recall events from previous therapies.  The patient with limited assistance at home. It was felt skilled nursing facility was needed with bed becoming available on December 08, 2014.  DISCHARGE MEDICATIONS:  Allopurinol 150 mg p.o. daily, Xanax 0.3 mg p.o. t.i.d., amoxicillin 500 mg p.o. 3 times daily x3 more days to complete for E. coli urinary tract infection, Librium 10 mg p.o. t.i.d., Colace 100 mg p.o. b.i.d., Folic acid 1 mg p.o. daily, multivitamin 1 tablet p.o. daily, Nicoderm patch 14 mg daily x2 weeks then 7 mg daily patch x3 weeks and stop, oxycodone immediate release 5-15 mg every 4 hours as needed pain, MiraLAX 17 g p.o. daily constipation hold for loose stools, Seroquel 100 mg p.o. at bedtime, Zocor 40 mg p.o. daily.  DIET:  Regular.  Special instructions.Foam dressing left arm  FOLLOWUP:  The patient would follow up Dr. Alger Simons at the outpatient rehab service office as needed; Dr. Altamese Hial[place], Orthopedic Services 2 weeks call for appointment; Dr. Ezequiel Kayser of [facility] in [place], medical  management.  The patient nonweightbearing, left upper extremity.  Weightbearing as tolerated, lower extremities.     Lauraine Rinne, P.A.   ______________________________ Meredith Staggers, M.D.    DA/MEDQ  D:  12/07/2014  T:  12/07/2014  Job:  [id number]  cc:   Raylene Miyamoto, MD Trauma Services

## 2014-12-07 NOTE — Progress Notes (Signed)
Occupational Therapy Session Note  Patient Details  Name: Edward Singleton MRN: [id number] Date of Birth: [birth year]  Today's Date: 12/07/2014 OT Individual Time: 1400-1500 OT Individual Time Calculation (min): 60 min    Short Term Goals: Week 2:  OT Short Term Goal 1 (Week 2): STG=LTG secondary to ELOS  Skilled Therapeutic Interventions/Progress Updates:   Upon arrival to room pt lying supine in bed; pt reports 5/10 when resting; once moving incorporated pain increases to 8/10, pain meds have been administered. Pt agreeable to afternoon session to address dynamic balance activities. Pt completes reaching out of BOS with standing with use of cane with steady a; noted LOB when pt stands of LLE (pt states weaker leg). Dynamic activities require constant verbals to adhere to LUE NWB during functional activities. Pt stands on blue foam mat and completing reach laterally and forward with minimal LOB noted. Pt propels self to kitchen area unilaterally and verbal cues to adhere to LUE NWB. Sling donned to ensure inability/reminder to use LUE. Pt completes following IADLs: stands and reaches into high and low surfaces for kitchen activities for safe return to home environment; making bed in room with close s for safety while manuevering around bed.     Therapy Documentation Precautions:  Precautions Precautions: Fall Required Braces or Orthoses: Other Brace/Splint, Sling Other Brace/Splint: LUE cast Restrictions Weight Bearing Restrictions: Yes RUE Weight Bearing: Weight bearing as tolerated LUE Weight Bearing: Non weight bearing RLE Weight Bearing: Weight bearing as tolerated LLE Weight Bearing: Weight bearing as tolerated       Therapy/Group: Individual Therapy  [name] 12/07/2014, 3:22 PM

## 2014-12-07 NOTE — Progress Notes (Signed)
Occupational Therapy Session Note  Patient Details  Name: Edward Singleton MRN: [id number] Date of Birth: 05/14/1951  Today's Date: 12/07/2014 OT Individual Time: 0940-1040   OT Individual Time Calculation (min): 60 min    Short Term Goals: Week 2:  OT Short Term Goal 1 (Week 2): STG=LTG secondary to ELOS  Skilled Therapeutic Interventions/Progress Updates:   Pt on this a date agreeable to shower task; wrapped LUE . Pt completes oral/grooming at sink level. Shower task and transfer require supv and use of grab bars. Dynamic balance in shower good during peri care and reaching out of BOS support; pt required education to sit for doffing/donning LE clothing due to fall risk.  Noted LLE edema; provided pt with set of TED hose; pt reports family took other ones home to be washed. Pt requires intermittent  vcs for maintaining LUE NWB status during task of transfers and sit to stands. Pt complains of pain entire left side 8/10; per nursing medication administered. Provided pt with education monitoring pain and pain management. Pt participates in dynamic balance activities with cane use ; noted LOB during activity; requiring verbal and tactile cues for stepping over with stronger leg to reduce risk of LOB/falls. Pt left with sling donned on LUE to improve comfort and reminder for NWB status to extermity     Therapy Documentation Precautions:  Precautions Precautions: Fall Required Braces or Orthoses: Other Brace/Splint, Sling Other Brace/Splint: LUE cast Restrictions Weight Bearing Restrictions: Yes RUE Weight Bearing: Weight bearing as tolerated LUE Weight Bearing: Non weight bearing RLE Weight Bearing: Weight bearing as tolerated LLE Weight Bearing: Weight bearing as tolerated      See FIM for current functional status  Therapy/Group: Individual Therapy  [name] 12/07/2014, 10:55 AM

## 2014-12-07 NOTE — Progress Notes (Signed)
Edward Singleton is a 64 y.o. male 1950-12-03 [id number]  Subjective: Reports improved pain control this AM - Appreciative of supportive pillow rest for LUE. No other new problems. Slept ok.  Objective: Vital signs in last 24 hours: Temp:  [98 F (36.7 C)-98.6 F (37 C)] 98.2 F (36.8 C) (02/28 0422) Pulse Rate:  [78-85] 78 (02/28 0422) Resp:  [16-17] 16 (02/28 0422) BP: (100-112)/(58-66) 112/66 mmHg (02/28 0422) SpO2:  [96 %-98 %] 98 % (02/28 0422) Weight change:  Last BM Date: 12/05/14  Intake/Output from previous day: 02/27 0701 - 02/28 0700 In: 480 [P.O.:480] Out: 900 [Urine:900]  Physical Exam General: No apparent distress    Lungs: Normal effort. Lungs clear to auscultation, no crackles or wheezes. Cardiovascular: Regular rate and rhythm, no edema Neurological: No new neurological deficits Wounds: dressed - Clean, dry, intact. No signs of infection.  Lab Results: BMET    Component Value Date/Time   NA 131* 11/26/2014 0732   K 3.7 11/26/2014 0732   CL 95* 11/26/2014 0732   CO2 27 11/26/2014 0732   GLUCOSE 135* 11/26/2014 0732   BUN 10 11/26/2014 0732   CREATININE 1.34 11/26/2014 0732   CALCIUM 8.2* 11/26/2014 0732   GFRNONAA 55* 11/26/2014 0732   GFRAA 64* 11/26/2014 0732   CBC    Component Value Date/Time   WBC 14.4* 12/04/2014 0605   RBC 2.61* 12/04/2014 0605   RBC 2.07* 11/17/2014 2009   HGB 8.6* 12/04/2014 0605   HCT 27.0* 12/04/2014 0605   PLT 974* 12/04/2014 0605   MCV 103.4* 12/04/2014 0605   MCH 33.0 12/04/2014 0605   MCHC 31.9 12/04/2014 0605   RDW 16.5* 12/04/2014 0605   LYMPHSABS 1.7 11/30/2014 1255   MONOABS 2.9* 11/30/2014 1255   EOSABS 0.4 11/30/2014 1255   BASOSABS 0.0 11/30/2014 1255   CBG's (last 3):  No results for input(s): GLUCAP in the last 72 hours. LFT's Lab Results  Component Value Date   ALT 32 11/26/2014   AST 59* 11/26/2014   ALKPHOS 177* 11/26/2014   BILITOT 1.2 11/26/2014    Studies/Results: No results  found.  Medications:  I have reviewed the patient's current medications. Scheduled Medications: . allopurinol  150 mg Oral Daily  . ALPRAZolam  0.5 mg Oral TID  . amoxicillin  500 mg Oral 3 times per day  . chlordiazePOXIDE  10 mg Oral TID  . docusate sodium  100 mg Oral BID  . enoxaparin (LOVENOX) injection  40 mg Subcutaneous Q24H  . feeding supplement (ENSURE COMPLETE)  237 mL Oral BID BM  . folic acid  1 mg Oral Daily  . multivitamin with minerals  1 tablet Oral Daily  . nicotine  14 mg Transdermal Daily  . polyethylene glycol  17 g Oral Daily  . QUEtiapine  100 mg Oral QHS  . simvastatin  40 mg Oral Q2000  . thiamine  100 mg Oral Daily   PRN Medications: acetaminophen, ipratropium-albuterol, ondansetron **OR** ondansetron (ZOFRAN) IV, oxyCODONE, sorbitol, zolpidem  Assessment/Plan: Principal Problem:   Multiple stable closed lateral compression fractures of pelvis, Left LC1 pelvic ring fx  Active Problems:   Closed fracture of left distal humerus   Acute blood loss anemia   Bacterial UTI   Memory loss  Medical Problem List and Plan: 1. Functional deficits secondary to multitrauma after a fall with left distal humerus fracture, pelvic fx's status post ORIF humerus fx. Nonweightbearing left upper extremity and splint was discontinued 11/26/2015. Left C1 pelvic ring fracture weightbearing  as tolerated. 2. DVT Prophylaxis/Anticoagulation: Subcutaneous Lovenox. Check vascular study 3. Pain Management: Oxycodone as needed. Monitor with increased mobility 4. Mood/agitation: Xanax 0.5 mg tid. -scheduled librium -seroquel qhs agitatiion -resume nicoderm patch 5. Neuropsych: This patient is not capable of making decisions on his own behalf. 6. Skin/Wound Care: Routine skin checks 7. Fluids/Electrolytes/Nutrition: encourage PO 8. History of alcohol abuse as well as illicit benzodiazepine use. 9. Hyperlipidemia. Zocor 10. History of gout.  Monitor for any signs of flareup. continue allopurinol 11. Leukocytosis/thrombocytosis - improved - both appear reactive  -remains afebrile -dopplers negative -serial cbc's - recheck 2/29 12. Ecoli UTI----continue amoxil for total 7 days  Length of stay, days: 12   Valerie A. Asa Lente, MD 12/07/2014, 8:34 AM

## 2014-12-08 ENCOUNTER — Inpatient Hospital Stay (HOSPITAL_COMMUNITY): Payer: 59 | Admitting: Physical Therapy

## 2014-12-08 ENCOUNTER — Inpatient Hospital Stay (HOSPITAL_COMMUNITY): Payer: Self-pay | Admitting: Speech Pathology

## 2014-12-08 ENCOUNTER — Inpatient Hospital Stay (HOSPITAL_COMMUNITY): Payer: Self-pay

## 2014-12-08 LAB — CBC
HEMATOCRIT: 31.3 % — AB (ref 39.0–52.0)
HEMOGLOBIN: 10.2 g/dL — AB (ref 13.0–17.0)
MCH: 34 pg (ref 26.0–34.0)
MCHC: 32.6 g/dL (ref 30.0–36.0)
MCV: 104.3 fL — ABNORMAL HIGH (ref 78.0–100.0)
Platelets: 1069 10*3/uL (ref 150–400)
RBC: 3 MIL/uL — ABNORMAL LOW (ref 4.22–5.81)
RDW: 16.5 % — ABNORMAL HIGH (ref 11.5–15.5)
WBC: 16 10*3/uL — ABNORMAL HIGH (ref 4.0–10.5)

## 2014-12-08 NOTE — Progress Notes (Signed)
Social Work Patient ID: Edward Singleton, male   DOB: 19-Jan-1951, 64 y.o.   MRN: [id number]   Received another SNF bed offer today  - [facility] also offering and pt / family accepting this offer.  Plans to d/c following lunch today.  Tx team aware,  Traci Gafford, LCSW

## 2014-12-08 NOTE — Progress Notes (Signed)
Physical Therapy Discharge Summary  Patient Details  Name: Edward Singleton MRN: [id number] Date of Birth: 1951-01-28  Today's Date: 12/08/2014 PT Individual Time: 0908-1000 PT Individual Time Calculation (min): 52 min    Patient has met 11 of 11 long term goals due to improved activity tolerance, improved balance, increased strength, decreased pain, ability to compensate for deficits, functional use of  left lower extremity, improved attention and improved awareness.  Patient to discharge at an ambulatory level Supervision.   Patient's care partner unable to provide the necessary physical, cognitive and supervision assistance at discharge and secondary to patient requiring 24/7 supervision pt to D/C to SNF for continued rehabilitation to reach higher level of functional mobility independence prior to D/C home alone.  Reasons goals not met: All goals met and exceeded  Recommendation:  Patient will benefit from ongoing skilled PT services in skilled nursing facility setting to continue to advance safe functional mobility, address ongoing impairments in pain, activity tolerance, strength, balance, gait, and minimize fall risk.  Equipment: deferred to next venue of care  Reasons for discharge: treatment goals met and discharge from hospital  Patient/family agrees with progress made and goals achieved: Yes  PT Discharge Pt seen for skilled PT session with focus on functional mobility training for controlled, home and community environment to continue to progress patient to mod I level which will be continued at SNF after D/C today.  Pt participated in gait training in controlled and home environments, car transfer training, stair negotiation training, w/c mobility training in controlled environment and transfer training in home environment (ADL apartment); please see below for details.  Pt required min verbal cues to recall safe sequences previously learned.  At end of session pt set up in w/c with  quick release belt on.  Pt requesting to pack his suitcase.  Pt agreeable to pack suitcase from w/c level in the room with nursing supervision.  Team and NT alerted to patient's wishes with NT to check in on patient and then transport pt to RN station when he had completed his packing.    Precautions/Restrictions Restrictions Weight Bearing Restrictions: Yes LUE Weight Bearing: Non weight bearing Pain Pain Assessment Pain Assessment: 0-10 Pain Score: 7  Pain Type: Acute pain Pain Location: Arm (Leg) Pain Orientation: Left Pain Descriptors / Indicators: Constant Pain Onset: On-going Pain Intervention(s): Medication (See eMAR) Cognition Overall Cognitive Status: Impaired/Different from baseline Arousal/Alertness: Awake/alert Orientation Level: Oriented X4 Attention: Sustained;Selective Sustained Attention: Appears intact Sustained Attention Impairment: Verbal basic;Functional basic Selective Attention: Impaired Selective Attention Impairment: Verbal basic;Functional basic Memory: Impaired Memory Impairment: Storage deficit;Retrieval deficit;Decreased recall of new information;Decreased short term memory Decreased Short Term Memory: Verbal basic;Functional basic Awareness: Impaired Awareness Impairment: Emergent impairment Problem Solving: Impaired Problem Solving Impairment: Verbal complex;Functional basic Decision Making: Impaired Decision Making Impairment: Verbal complex;Functional basic Initiating: Appears intact Safety/Judgment: Impaired Sensation Sensation Light Touch: Appears Intact Stereognosis: Not tested Hot/Cold: Appears Intact Proprioception: Appears Intact Coordination Gross Motor Movements are Fluid and Coordinated: Yes Fine Motor Movements are Fluid and Coordinated: Yes Coordination and Movement Description: limited by pain throughout L side Finger Nose Finger Test: unable to complete with LUE due to pain Motor  Motor Motor: Other (comment) Motor -  Discharge Observations: Generalized weakness due to pain  Mobility Performed home transfer training in ADL apartment with pt performing bed mobility on flat, regular bed mod I.  Pt also performed transfers on/off low, soft couch with supervision.  Performed car transfer training (in case of family transporting  pt to SNF) with pt performing stand pivot in/out of car with supervision with pt problem solving use of hooked side of SPC to lift LLE into car over frame.  Bed Mobility Bed Mobility: Sit to Supine;Supine to Sit Supine to Sit: 6: Modified independent (Device/Increase time);HOB flat Sit to Supine: 6: Modified independent (Device/Increase time);HOB flat Transfers Stand Pivot Transfers: 5: Supervision Locomotion  Ambulation Ambulation/Gait Assistance: 5: Supervision Ambulation Distance (Feet): 200 Feet Assistive device: Straight cane Gait Gait Pattern: Impaired Gait Pattern: Step-to pattern;Step-through pattern;Decreased stance time - left;Decreased step length - right;Wide base of support;Abducted - left;Trunk flexed;Antalgic Required verbal cues for increased step length RLE, increased stance time LLE, and to maintain more upright trunk posture for more controlled anterior weigh shifting. Stairs / Additional Locomotion Stairs: Yes Stairs Assistance: 5: Supervision Stair Management Technique: One rail Right;Step to pattern;Forwards with verbal cues needed to recall safe sequence; able to return demonstrate with supervision Number of Stairs: 12 Height of Stairs: 6.5 Wheelchair Mobility Wheelchair Mobility: Yes Wheelchair Assistance: 5: Supervision Wheelchair Propulsion: Right upper extremity;Both lower extremities  Wheelchair Parts Management: Needs assistance Distance: 150  Trunk/Postural Assessment  Cervical Assessment Cervical Assessment: Within Functional Limits Thoracic Assessment Thoracic Assessment: Within Functional Limits Lumbar Assessment Lumbar Assessment: Within  Functional Limits Postural Control Postural Control: Deficits on evaluation Protective Responses: delayed balance reactions due to pain  Balance Balance Balance Assessed: Yes Standardized Balance Assessment Standardized Balance Assessment: Berg Balance Test;Timed Up and Go Test Berg Balance Test Sit to Stand: Able to stand without using hands and stabilize independently Standing Unsupported: Able to stand safely 2 minutes Sitting with Back Unsupported but Feet Supported on Floor or Stool: Able to sit safely and securely 2 minutes Stand to Sit: Sits safely with minimal use of hands Transfers: Able to transfer safely, definite need of hands Standing Unsupported with Eyes Closed: Able to stand 10 seconds safely Standing Ubsupported with Feet Together: Able to place feet together independently and stand 1 minute safely From Standing, Reach Forward with Outstretched Arm: Can reach forward >5 cm safely (2") From Standing Position, Pick up Object from Floor: Able to pick up shoe safely and easily From Standing Position, Turn to Look Behind Over each Shoulder: Looks behind one side only/other side shows less weight shift Turn 360 Degrees: Able to turn 360 degrees safely but slowly Standing Unsupported, Alternately Place Feet on Step/Stool: Able to stand independently and complete 8 steps >20 seconds Standing Unsupported, One Foot in Front: Able to plae foot ahead of the other independently and hold 30 seconds Standing on One Leg: Able to lift leg independently and hold > 10 seconds Total Score: 48 (improved from 39/56 4 days ago) Timed Up and Go Test TUG: Normal TUG Normal TUG (seconds): 13.77 improved from 17 seconds 4 days ago (Scores >13.5 seconds indicate high falls risk) Static Sitting Balance Static Sitting - Level of Assistance: 7: Independent Dynamic Sitting Balance Dynamic Sitting - Balance Support: Feet supported Dynamic Sitting - Level of Assistance: 6: Modified independent  (Device/Increase time) Static Standing Balance Static Standing - Balance Support: Right upper extremity supported Static Standing - Level of Assistance: 5: Stand by assistance Dynamic Standing Balance Dynamic Standing - Balance Support: Right upper extremity supported Dynamic Standing - Level of Assistance: 5: Stand by assistance Extremity Assessment  RUE Assessment RUE Assessment: Within Functional Limits LUE Assessment LUE Assessment: Exceptions to [facility] LUE AROM (degrees) LUE Overall AROM Comments: AROM at wrist and elbow limited by UE ace wrapping; AROM WFL at  shoulder RLE Assessment RLE Assessment: Exceptions to [facility] RLE Strength RLE Overall Strength: Deficits;Due to pain RLE Overall Strength Comments: 4+/5 due to pain LLE Strength LLE Overall Strength: Deficits;Due to pain LLE Overall Strength Comments: 3/5 due to pain  See FIM for current functional status  Raylene Everts [facility] 12/08/2014, 11:50 AM

## 2014-12-08 NOTE — Progress Notes (Signed)
Occupational Therapy Discharge Summary  Patient Details  Name: Edward Singleton MRN: [id number] Date of Birth: [birth year]     Patient has met 10 of 12 long term goals due to improved activity tolerance, improved balance, ability to compensate for deficits, improved attention, improved awareness and improved coordination.  Pt made steady progress with BADLs during this admission.  Pt requires close supervision with transitional movements, dynamic standing tasks, and functional amb with SPC.  Pt requires min verbal cues for adhering to LUE NWB status and emergent awareness.  Pt is oriented X 4 with visual cues. Pt's family has not been present for therapy and patient is discharging to a SNF. Patient to discharge at overall Supervision level.  Patient's care partner not necessary to provide the necessary physical and cognitive assistance at discharge secondary to patient discharging to SNF.    Reasons goals not met: Patient did not meet LTG for emergent awareness as patient continues to require min assist. Patient had LTG for simple meal prep, however not applicable secondary to discharge to SNF.  Recommendation:  Patient will benefit from ongoing skilled OT services in skilled nursing facility setting to continue to advance functional skills in the area of BADLs, safety, balance, activity tolerance, strength, and minimize fall risk.  Equipment: No equipment provided Discharge to SNF  Reasons for discharge: treatment goals met and discharge from hospital  Patient/family agrees with progress made and goals achieved: Yes  OT Discharge  Vision/Perception  Vision- History Baseline Vision/History: Wears glasses Wears Glasses: At all times Patient Visual Report: No change from baseline;Other (comment) Vision- Assessment Vision Assessment?: No apparent visual deficits Visual Fields: No apparent deficits  Cognition Overall Cognitive Status: Impaired/Different from baseline Arousal/Alertness:  Awake/alert Orientation Level: Oriented X4 Attention: Sustained;Selective Sustained Attention: Appears intact Selective Attention: Impaired Memory: Impaired Memory Impairment: Decreased short term memory;Retrieval deficit;Decreased recall of new information;Storage deficit Decreased Short Term Memory: Verbal basic;Functional basic Awareness: Impaired Awareness Impairment: Emergent impairment Problem Solving: Impaired Problem Solving Impairment: Functional basic Safety/Judgment: Impaired Sensation Sensation Light Touch: Appears Intact Hot/Cold: Appears Intact Proprioception: Appears Intact Coordination Gross Motor Movements are Fluid and Coordinated: Yes Fine Motor Movements are Fluid and Coordinated: Yes Coordination and Movement Description: limited by pain throughout L side Finger Nose Finger Test: unable to complete with LUE due to pain Trunk/Postural Assessment  Cervical Assessment Cervical Assessment: Within Functional Limits Thoracic Assessment Thoracic Assessment: Within Functional Limits Lumbar Assessment Lumbar Assessment: Within Functional Limits Postural Control Postural Control: Deficits on evaluation Protective Responses: delayed/impaire  Balance Dynamic Sitting Balance Dynamic Sitting - Balance Support: Feet supported Dynamic Sitting - Level of Assistance: 5: Stand by assistance Extremity/Trunk Assessment RUE Assessment RUE Assessment: Within Functional Limits LUE Assessment LUE Assessment: Exceptions to WFL LUE AROM (degrees) LUE Overall AROM Comments: AROM at wrist and elbow limited by UE ace wrapping; AROM WFL at shoulder  See FIM for current functional status  Leroy Libman 12/08/2014, 8:36 AM

## 2014-12-08 NOTE — Progress Notes (Signed)
Patient discharged to home at 1348 in stable condition, no distress; accompanied by sister. Escorted by NT via wheelchair. Discharge instructions provided to patient and sister by PA. Patient left unit with belongings.

## 2014-12-08 NOTE — Plan of Care (Signed)
Problem: RH Simple Meal Prep Goal: LTG Patient will perform simple meal prep w/assist (OT) LTG: Patient will perform simple meal prep with assistance, with/without cues (OT).  Outcome: Not Applicable Date Met:  27/00/48 Pt discharging to SNF  Problem: RH Awareness Goal: LTG: Patient will demonstrate intellectual/emergent (OT) LTG: Patient will demonstrate intellectual/emergent/anticipatory awareness with assist during a functional activity (OT)  Outcome: Not Met (add Reason) Pt requires min verbal cues for emergent awareness

## 2014-12-08 NOTE — Progress Notes (Signed)
[place] PHYSICAL MEDICINE & REHABILITATION     PROGRESS NOTE    Subjective/Complaints: Had a good weekend. Asks when he might go today A  review of systems has been performed and if not noted above is otherwise negative.   Objective: Vital Signs: Blood pressure 100/62, pulse 81, temperature 98.2 F (36.8 C), temperature source Oral, resp. rate 17, SpO2 94 %. No results found. No results for input(s): WBC, HGB, HCT, PLT in the last 72 hours. No results for input(s): NA, K, CL, GLUCOSE, BUN, CREATININE, CALCIUM in the last 72 hours.  Invalid input(s): CO CBG (last 3)  No results for input(s): GLUCAP in the last 72 hours.  Wt Readings from Last 3 Encounters:  11/17/14 74.844 kg (165 lb)    Physical Exam:  Head: Normocephalic.  Eyes: EOM are normal.  Neck: Normal range of motion.  Cardiovascular: Normal rate and regular rhythm.  Respiratory: Effort normal and breath sounds normal. No respiratory distress.  GI: Soft. Bowel sounds are normal. He exhibits no distension.  Musculoskeletal:  Left upper extremity in splint  Neurological: He is alert.  Patient is oriented to person and hospital.  Skin: Skin is warm and dry.  Numerous scabs and scars on limbs, trunk. Incision intact Psychiatric:  No evidence of lability or agitation. Calm and collected  Assessment/Plan: 1. Functional deficits secondary to polytrauma including pelvic fx's, left distal humerus fx which require 3+ hours per day of interdisciplinary therapy in a comprehensive inpatient rehab setting. Physiatrist is providing close team supervision and 24 hour management of active medical problems listed below. Physiatrist and rehab team continue to assess barriers to discharge/monitor patient progress toward functional and medical goals. FIM: FIM - Bathing Bathing Steps Patient Completed: Chest, Right Arm, Left Arm, Abdomen, Front perineal area, Buttocks, Right upper leg, Left upper leg, Right lower leg (including  foot), Left lower leg (including foot) Bathing: 5: Supervision: Safety issues/verbal cues  FIM - Upper Body Dressing/Undressing Upper body dressing/undressing steps patient completed: Thread/unthread right sleeve of pullover shirt/dresss, Thread/unthread left sleeve of pullover shirt/dress, Put head through opening of pull over shirt/dress, Pull shirt over trunk Upper body dressing/undressing: 7: Complete Independence: No helper FIM - Lower Body Dressing/Undressing Lower body dressing/undressing steps patient completed: Thread/unthread right underwear leg, Thread/unthread left underwear leg, Pull underwear up/down, Thread/unthread right pants leg, Thread/unthread left pants leg, Pull pants up/down, Don/Doff right sock, Don/Doff left sock, Don/Doff right shoe, Don/Doff left shoe, Fasten/unfasten right shoe, Fasten/unfasten left shoe Lower body dressing/undressing: 5: Supervision: Safety issues/verbal cues  FIM - Toileting Toileting steps completed by patient: Adjust clothing prior to toileting, Performs perineal hygiene, Adjust clothing after toileting Toileting Assistive Devices: Grab bar or rail for support Toileting: 5: Supervision: Safety issues/verbal cues  FIM - Radio producer Devices: Grab bars, Oncologist Transfers: 5-To toilet/BSC: Supervision (verbal cues/safety issues), 5-From toilet/BSC: Supervision (verbal cues/safety issues)  FIM - Control and instrumentation engineer Devices: Cane, Arm rests Bed/Chair Transfer: 5: Bed > Chair or W/C: Supervision (verbal cues/safety issues), 5: Chair or W/C > Bed: Supervision (verbal cues/safety issues), 5: Sit > Supine: Supervision (verbal cues/safety issues)  FIM - Locomotion: Wheelchair Distance: 200 Locomotion: Wheelchair: 1: Total Assistance/staff pushes wheelchair (Pt<25%) FIM - Locomotion: Ambulation Locomotion: Ambulation Assistive Devices: Journalist, newspaper Ambulation/Gait Assistance: 5:  Supervision Locomotion: Ambulation: 1: Travels less than 50 ft with supervision/safety issues  Comprehension Comprehension Mode: Auditory Comprehension: 5-Understands basic 90% of the time/requires cueing < 10% of the time  Expression Expression  Mode: Verbal Expression: 5-Expresses basic 90% of the time/requires cueing < 10% of the time.  Social Interaction Social Interaction: 5-Interacts appropriately 90% of the time - Needs monitoring or encouragement for participation or interaction.  Problem Solving Problem Solving: 4-Solves basic 75 - 89% of the time/requires cueing 10 - 24% of the time  Memory Memory: 3-Recognizes or recalls 50 - 74% of the time/requires cueing 25 - 49% of the time  Medical Problem List and Plan: 1. Functional deficits secondary to multitrauma after a fall with left distal humerus fracture, pelvic fx's status post ORIF humerus fx. Nonweightbearing left upper extremity and splint was discontinued 11/26/2015. Left C1 pelvic ring fracture weightbearing as tolerated. 2. DVT Prophylaxis/Anticoagulation: Subcutaneous Lovenox. Check vascular study 3. Pain Management: Oxycodone as needed. Monitor with increased mobility 4. Mood/agitation: Xanax 0.5 mg tid.  -scheduled librium  -seroquel qhs agitatiion  -resumed nicoderm patch 5. Neuropsych: This patient is not capable of making decisions on his own behalf. 6. Skin/Wound Care: Routine skin checks 7. Fluids/Electrolytes/Nutrition: encourage PO 8. History of alcohol abuse as well as illicit benzodiazepine use. 9. Hyperlipidemia. Zocor 10. History of gout. Monitor for any signs of flareup/continue allopurinol 11. Leukocytosis: down to 14.4   -remains is afebrile  -dopplers negative  -Ecoli UTI----continue for another 3 days after discharge  -follow white count to resolution   LOS (Days) 13 A FACE TO FACE EVALUATION WAS PERFORMED  Edward Singleton T 12/08/2014 8:28 AM

## 2014-12-08 NOTE — Plan of Care (Signed)
Problem: RH Awareness Goal: LTG: Patient will demonstrate intellectual/emergent (OT) LTG: Patient will demonstrate intellectual/emergent/anticipatory awareness with assist during a functional activity (OT)  Outcome: Not Met (add Reason) Not met secondary to patient requiring min A

## 2014-12-08 NOTE — Progress Notes (Signed)
Speech Language Pathology Discharge Summary and Progress Note  Patient Details  Name: Edward Singleton MRN: [id number] Date of Birth: 02-16-1951  Today's Date: 12/08/2014 SLP Individual Time: 0800-0900 SLP Individual Time Calculation (min): 60 min   Skilled Therapeutic Interventions: Skilled treatment session focused on cognitive goals. Student facilitated session through administration of the [facility] Cognitive Assessment [facility]), an assessment task evaluating attention, working memory, problem solving, and reasoning. Patient scored 18/30, with a score of 26 or greater considered normal. Patient was Mod I for selective attention in a quiet environment throughout session and for assessment tasks evaluating attention, and required Min A multimodal cues for tasks addressing problem solving and short-term memory. Student further facilitated session by providing Mod A fading to Min A multimodal cues for functional problem-solving during basic daily scheduling task; suspect task familiarization resulted in decrease of necessary support. Patient required Min A question cues for emergent awareness of cognitive and physical deficits and for use of external aid to assist with recall of daily functional information. Patient left in wheelchair with quick-release belt in place and all needs within reach. Continue with current plan of care.   Patient has met 6 of 6 long term goals.  Patient to discharge at [facility] level.   Reasons goals not met: n/a   Clinical Impression/Discharge Summary: Patient has made functional gains and has met 6 out of 6 LTGs this admission due to increased attention, functional problem solving, short-term memory, emergent awareness, safety awareness, and decreased confusion. Currently, patient requires Min A multimodal cues for use of external aid to assist in recall, functional problem solving, and emergent and safety awareness. Patient also needs Supervision A multimodal cues to  maintain selective attention in a quiet environment for ~30 minutes. Patient's family is unable to provide the necessary assistance at this time, therefore, patient will discharge to a SNF. Patient would continue to benefit from skilled SLP intervention at the next venue of care to maximize cognitive functioning and functional independence in order to decrease caregiver burden.  Care Partner:  Caregiver Able to Provide Assistance: Other (comment);No (d/c to SNF)  Type of Caregiver Assistance: Physical;Cognitive  Recommendation:  Skilled Nursing facility;24 hour supervision/assistance  Rationale for SLP Follow Up: Maximize cognitive function and independence;Reduce caregiver burden   Equipment: n/a   Reasons for discharge: Treatment goals met;Discharged from hospital   Patient/Family Agrees with Progress Made and Goals Achieved: Yes   See FIM for current functional status  Servando Snare 12/08/2014, 11:38 AM

## 2014-12-08 NOTE — Progress Notes (Signed)
Occupational Therapy Session Note  Patient Details  Name: ONAJE ANGON MRN: [id number] Date of Birth: 1951-03-23  Today's Date: 12/08/2014 OT Individual Time: 0700-0800 OT Individual Time Calculation (min): 60 min    Short Term Goals: Week 2:  OT Short Term Goal 1 (Week 2): STG=LTG secondary to ELOS  Skilled Therapeutic Interventions/Progress Updates:    Pt resting in bed upon arrival and ready for shower this morning.  Pt amb with SPC to gather clothing prior to amb into bathroom to use toilet and take shower.  Pt stood for approx 75% of shower using grab bar when appropriate to maintain balance.  Pt continues to required min verbal cues to adhere to LUE NWB status.  Pt completed dressing with sit<>stand from EOB and grooming (including shaving) while standing at sink.  Pt exhibited LOB X 1 while turning to gather clothing from drawers and required min A to steady.  Pt continues to require occasional verbal cues for safety awareness.  Pt oriented X 4 this morning and acknowledge that he was discharging to SNF for further rehab.  Focus on activity tolerance, sit<>stand, standing balance, functional amb with SPC, and safety awareness.  Therapy Documentation Precautions:  Precautions Precautions: Fall Required Braces or Orthoses: Other Brace/Splint, Sling Other Brace/Splint: LUE cast Restrictions Weight Bearing Restrictions: Yes RUE Weight Bearing: Weight bearing as tolerated LUE Weight Bearing: Non weight bearing RLE Weight Bearing: Weight bearing as tolerated LLE Weight Bearing: Weight bearing as tolerated Pain: Pain Assessment Pain Assessment: 0-10 Pain Score: 8  Left leg and hip RN aware and repositioned  See FIM for current functional status  Therapy/Group: Individual Therapy  Leroy Libman 12/08/2014, 8:01 AM

## 2014-12-09 NOTE — Progress Notes (Signed)
Social Work  Discharge Note  The overall goal for the admission was met for:   Discharge location: No - plan changed to SNF as family unable to meet supervision/ min assist needs  Length of Stay: No - extended slightly due to SNF bed search;  LOS = 13 days  Discharge activity level: Yes - supervision  Home/community participation: Yes  Services provided included: MD, RD, PT, OT, SLP, RN, TR, Pharmacy, Neuropsych and SW  Financial Services: Private Insurance: [facility]  Follow-up services arranged: Other: SNF @ [place]  Comments (or additional information):  Patient/Family verbalized understanding of follow-up arrangements: Yes  Individual responsible for coordination of the follow-up plan: pt/ sister assist  Confirmed correct DME delivered: NA    Edward Singleton

## 2014-12-24 ENCOUNTER — Encounter: Payer: 59 | Attending: Physical Medicine & Rehabilitation | Admitting: Physical Medicine & Rehabilitation

## 2014-12-24 ENCOUNTER — Encounter: Payer: Self-pay | Admitting: Physical Medicine & Rehabilitation

## 2014-12-24 VITALS — BP 126/80 | HR 84 | Resp 14

## 2014-12-24 DIAGNOSIS — S329XXS Fracture of unspecified parts of lumbosacral spine and pelvis, sequela: Secondary | ICD-10-CM | POA: Diagnosis not present

## 2014-12-24 DIAGNOSIS — F1010 Alcohol abuse, uncomplicated: Secondary | ICD-10-CM | POA: Insufficient documentation

## 2014-12-24 DIAGNOSIS — F411 Generalized anxiety disorder: Secondary | ICD-10-CM | POA: Diagnosis not present

## 2014-12-24 DIAGNOSIS — S42402S Unspecified fracture of lower end of left humerus, sequela: Secondary | ICD-10-CM | POA: Diagnosis present

## 2014-12-24 DIAGNOSIS — F1310 Sedative, hypnotic or anxiolytic abuse, uncomplicated: Secondary | ICD-10-CM | POA: Diagnosis not present

## 2014-12-24 MED ORDER — ALPRAZOLAM 0.5 MG PO TABS: 0.5000 mg | ORAL_TABLET | Freq: Three times a day (TID) | ORAL | Status: DC

## 2014-12-24 MED ORDER — OXYCODONE HCL 10 MG PO TABS: 10.0000 mg | ORAL_TABLET | Freq: Four times a day (QID) | ORAL | Status: DC | PRN

## 2014-12-24 NOTE — Patient Instructions (Signed)
YOU MAY DECREASE THE SEROQUEL TO 50MG  IF YOU WISH.

## 2014-12-24 NOTE — Progress Notes (Signed)
Subjective:    Patient ID: Edward Singleton, male    DOB: [birth year], 64 y.o.   MRN: [id number]  HPI   Mr. Bibeault is back regarding his polytrauma and balance as well as EtoH.  He is having ongoing pain in his left elbow and pelvis. He is concerned about potentially having a UTI. bm's are normal.   He is walking with a cane currently. No falls reported.   Behaviorally he's been doing well. He denies smoking or use of etoH.  He has disassociated from his prior friends. His sister (who's with him today) is heavily involved in his life.   For pain, he's taking 10mg  oxy IR every 6 hours. He is not using modalities for pain relief.  He was discharged from the SNF last weekend. HH has not come out to the house.     Pain Inventory Average Pain 8 Pain Right Now 8 My pain is constant  In the last 24 hours, has pain interfered with the following? General activity 5 Relation with others 3 Enjoyment of life 0 What TIME of day is your pain at its worst? all Sleep (in general) Good  Pain is worse with: walking, bending, sitting, inactivity, standing and some activites Pain improves with: rest, heat/ice and medication Relief from Meds: 2  Mobility walk without assistance walk with assistance use a cane how many minutes can you walk? unlimited ability to climb steps?  yes do you drive?  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  yes  Neuro/Psych No problems in this area  Prior Studies hospital f/u  Physicians involved in your care hospital f/u   History reviewed. No pertinent family history. History   Social History  . Marital Status: Single    Spouse Name: N/A  . Number of Children: N/A  . Years of Education: N/A   Social History Main Topics  . Smoking status: Current Every Day Smoker  . Smokeless tobacco: Not on file  . Alcohol Use: Yes  . Drug Use: Not on file  . Sexual Activity: Not on file   Other  Topics Concern  . None   Social History Narrative   Past Surgical History  Procedure Laterality Date  . Nephrectomy    . Orif humerus fracture Left 11/18/2014    Procedure: OPEN REDUCTION INTERNAL FIXATION (ORIF) DISTAL HUMERUS FRACTURE;  Surgeon: Rozanna Box, MD;  Location: [place];  Service: Orthopedics;  Laterality: Left;   Past Medical History  Diagnosis Date  . Hypertension   . Hypercholesteremia   . Gout    There were no vitals taken for this visit.  Opioid Risk Score:   Fall Risk Score: Moderate Fall Risk (6-13 points) (patient given pamphlet during todays visit)  Review of Systems  All other systems reviewed and are negative.      Objective:   Physical Exam  Head: Normocephalic.  Eyes: EOM are normal.  Neck: Normal range of motion.  Cardiovascular: Normal rate and regular rhythm.  Respiratory: Effort normal and breath sounds normal. No respiratory distress.  GI: Soft. Bowel sounds are normal. He exhibits no distension.  Musculoskeletal:  Left upper extremity ROM 90-95 in flexion -5 extension.  Antalgic with gati LLE. Neurological: He is alert.  Patient is oriented to person and hospital. Skin: Skin is warm and dry.  Scab left elbow with hypergranulation Psychiatric:  No evidence of lability or agitation. Calm and  collected   Assessment/Plan: 1. Functional deficits secondary to multitrauma after a fall with left distal humerus fracture, pelvic fx's status post ORIF humerus fx. Nonweightbearing left upper extremity and splint was discontinued 11/26/2015. Left C1 pelvic ring fracture weightbearing as tolerated.  -home health PT/OT [facility] made to [facility] 2. DVT Prophylaxis/Anticoagulation:     3. Pain Management: Oxycodone 10mg  q6 prn #120---goal is to taper over the next few months  -heat, ice, modalities  4. Mood/agitation: Xanax 0.5 mg tid.  6. Skin/Wound Care: silver nitrate today---otherwise keep dry 7. Fluids/Electrolytes/Nutrition:  encourage PO 8. History of alcohol abuse as well as illicit benzodiazepine use.  -recommended AAA meeting to start   10. History of gout. Monitor for any signs of flareup/continue allopurinol  Follow up with me or NP in one month. Thirty minutes of face to face patient care time were spent during this visit. All questions were encouraged and answered.

## 2014-12-30 ENCOUNTER — Telehealth: Payer: Self-pay | Admitting: *Deleted

## 2014-12-30 NOTE — Telephone Encounter (Signed)
Edward Singleton, Edward Singleton's sister called with some concerns about "Edward Singleton".  She wanted Dr Naaman Plummer to know that he had a really good week last week but yesterday he was really off. She has concerns about his medications.  They usually fill his med box but his medication counts are way off and she has some concerns there.  She is requesting a call back from Dr Naaman Plummer to discuss. He hom # is [phone],  Cell is # [phone]

## 2014-12-31 NOTE — Telephone Encounter (Signed)
Called and left VM.  I asked her to call here and leave a message if she desired.

## 2015-01-05 ENCOUNTER — Telehealth: Payer: Self-pay

## 2015-01-05 NOTE — Telephone Encounter (Signed)
Edward Singleton stop by to pick up form and she stated that [facility] had not heard from Edward Singleton yet, and that he was doing some exercises on his own. Please call sister Edward Singleton to set up appointment, I also let Edward Singleton know we needed to reschedule appointment in  [phone] for him.

## 2015-01-19 ENCOUNTER — Telehealth: Payer: Self-pay | Admitting: *Deleted

## 2015-01-19 NOTE — Telephone Encounter (Signed)
Recd letter from [facility]. They have not been able to make contact with this patient, he is non compliant.  I tried all 3 number as well.

## 2015-01-27 ENCOUNTER — Ambulatory Visit: Payer: Self-pay | Admitting: Physical Medicine & Rehabilitation

## 2015-01-27 NOTE — Op Note (Signed)
PATIENT NAME:  Edward Singleton, Edward Singleton MR#:  [id number] DATE OF BIRTH:  [birth year]  DATE OF PROCEDURE:  08/22/2012  PREOPERATIVE DIAGNOSES:  1. Renal artery stenosis.  2. Chronic kidney disease with baseline creatinine of 1.85.  3. Hypertension.   POSTOPERATIVE DIAGNOSES:  1. Renal artery stenosis.  2. Chronic kidney disease with baseline creatinine of 1.85.  3. Hypertension.   PROCEDURES:  1. Ultrasound guidance for vascular access, left femoral artery.  2. Catheter placed in left renal artery from left femoral approach.  3. Aortogram and selective left renal angiogram.  4. Placement of a 5 mm diameter x 18 mm length Herculink balloon expandable stent in the left renal artery with the use of the Medtronic PercuSurge embolic protection device.  5. StarClose closure device, left femoral artery.   SURGEON: Algernon Huxley, MD  ANESTHESIA: Local with moderate conscious sedation.   ESTIMATED BLOOD LOSS: 50 mL.   FLUOROSCOPY TIME: 4.5 minutes.   CONTRAST USED: 50 mL.   INDICATION FOR PROCEDURE: 64 year old white male with a small left kidney, hypertension which is significant and chronic kidney disease. Noninvasive study was nondiagnostic for renal artery stenosis but clinically they are suspicious and he is brought in for angiography for further evaluation. Risks and benefits were discussed. Informed consent was obtained.   DESCRIPTION OF PROCEDURE: Patient is brought to the vascular interventional radiology suite. Groins shaved and prepped, sterile surgical field was created. Ultrasound was used to visualize a patent left femoral artery. It was then accessed under direct ultrasound guidance without difficulty with a Seldinger needle. The left femoral artery selected as the right femoral artery appeared more heavily calcified on ultrasound. A pigtail catheter was placed in the aorta at the L1 level and AP aortogram was performed. This showed what appeared to be a nonhemodynamically significant right  renal artery stenosis but a very high-grade left renal artery stenosis of greater than 90%. I then upsized to a 6 [place] sheath and gave heparin and mannitol. A 6 French IM guide catheter was used to cannulate the left renal artery without difficulty and I crossed the lesion with the Medtronic PercuSurge embolic protection device, parked this in the main distal renal artery and inflated this for embolic protection. I then selected a 5 mm diameter x 18 mm length Herculink balloon expandable stent, deployed this encompassing the stenosis just into the aorta and landing in the distal renal artery just beyond the stenosis in excellent location. This was taken to burst pressure. Completion angiogram now showed this to be widely patent with a good nephrogram and I elected to terminate the procedure. The guide catheter was removed. Oblique arteriogram was performed. StarClose closure device deployed in the usual fashion with excellent hemostatic result. The patient tolerated procedure well and was taken to the recovery room in stable condition having tolerated the procedure well.   ____________________________ Algernon Huxley, MD jsd:cms D: 08/22/2012 16:03:59 ET T: 08/22/2012 16:25:30 ET JOB#: [id number]  cc: Algernon Huxley, MD, <Dictator> Christena Flake. Raechel Ache, MD Alcus Dad, MD  Algernon Huxley MD ELECTRONICALLY SIGNED 08/27/2012 9:04

## 2015-01-31 NOTE — H&P (Signed)
PATIENT NAME:  Edward Singleton, Edward Singleton MR#:  [id number] DATE OF BIRTH:  1950-11-30  DATE OF ADMISSION:  01/15/2014  PRIMARY CARE PHYSICIAN:  Sharlet Salina C. Hall Busing, MD  CHIEF COMPLAINT: Syncope, weakness.   HISTORY OF PRESENT ILLNESS: A 64 year old Caucasian male patient with history of hypertension, COPD, tobacco abuse, CKD stage 3, status post splenectomy and renal artery stenosis, presents to the Emergency Room with episode of passing out. The patient transports blood. He was sitting in his [employer] filling out his paperwork and, the next thing he remembers, is 2 women working there trying to wake him up.  Then EMS arrived and brought the patient to the Emergency Room. Here, the patient was found to have blood pressure of 91/61, blood sugars at 60. His heart rate tends to run between 50 to 65, regular, and is being admitted to the hospitalist service for further  workup and treatment of his syncope. CT of the head does not show anything acute. Chest x-ray shows findings of COPD but no pneumonia.   The patient has not had any diarrhea, nausea, vomiting, shortness of breath, dysuria or change in medications. He felt fine prior to the episode, carrying on with his regular activities. Presently, he states he is very shaky, feels cold and feels worn out.   PAST MEDICAL HISTORY: 1.  Hypertension.  2.  COPD.   3.  Tobacco abuse.  4.  Gout.  5.  Nephrolithiasis.  6.  Splenectomy.  7.  Left renal artery stenosis with stent attempted, complication of nephrectomy.   FAMILY HISTORY: Diabetes, CVA, epilepsy.   SOCIAL HISTORY: The patient smokes a pack a day. Rare alcohol. No illicit drug use.   CODE STATUS: Full code.   ALLERGIES: No drug allergies.   REVIEW OF SYSTEMS:  CONSTITUTIONAL:  He complains of fatigue and weakness.  EYES: No blurred vision, pain, redness.  ENT: No tinnitus, ear pain, hearing loss.  RESPIRATORY: No cough, wheeze, hemoptysis. Has COPD.   CARDIOVASCULAR: No chest pain, orthopnea, edema.   GASTROINTESTINAL: No nausea, vomiting, diarrhea, abdominal pain.  GENITOURINARY: No dysuria, hematuria, frequency.  ENDOCRINE: No polyuria, nocturia, thyroid problems.  HEMATOLOGIC AND LYMPHATIC: No anemia, easy bruising, bleeding.  INTEGUMENTARY: No skin rash or lesion.  MUSCULOSKELETAL: No back pain, arthritis.  NEUROLOGIC: No focal numbness, weakness, seizure.  PSYCHIATRIC:  No anxiety or depression.   HOME MEDICATIONS: Are not available at this time but the patient mentions that he takes 2 blood pressure pills along with blood thinners and Percocet.   PHYSICAL EXAMINATION: VITAL SIGNS: Temperature 97.8, heart rate of 57, breathing 18 per minute, blood pressure 95/57, saturating 98% on room air.  GENERAL: Moderately-built Caucasian male patient sitting up in bed, having some chills, rigors.  PSYCHIATRIC: Alert and oriented x 3, anxious.  HEENT:  Normocephalic. Oral mucosa moist and pink. External ears and nose normal. No pallor. No icterus. Pupils bilaterally equal and reactive to light.  NECK:  No thyromegaly or palpable lymph nodes. Trachea midline. No carotid bruit or JVD.  CARDIOVASCULAR: S1, S2 bradycardic without any murmurs. Peripheral pulses 2+. No edema.  RESPIRATORY: Normal work of breathing. Has mild expiratory wheezes. Good air entry.  GASTROINTESTINAL: Soft abdomen, nontender. Bowel sounds present. No hepatosplenomegaly palpable.  GENITOURINARY: No CVA tenderness, bladder distention.  SKIN: Warm and dry. No petechiae, rash, ulcers.  MUSCULOSKELETAL: No joint swelling, redness, effusion of the large joints. Normal muscle tone.  NEUROLOGICAL: Motor strength 5 out of 5 in upper and lower extremities. Sensation is intact all  over. Cranial nerves II through XII intact.  LYMPHATIC: No cervical lymphadenopathy.   LABORATORY, DIAGNOSTIC AND RADIOLOGICAL DATA:  Show:  1.  Glucose of 75, BUN 40, creatinine 2.30, sodium 136, potassium 5, chloride 106, GFR of 30. Troponin less than  0.02.  2.  WBC 17.4, hemoglobin 11.5, platelets of 308 with MCV 110.  3.  Urinalysis shows no bacteria, no WBCs.  4.  EKG shows sinus bradycardia of 57. No acute ST-T wave changes. PR interval of 182.  5.  Chest x-ray shows COPD, nothing acute.  6.  A CT of the head shows atrophy.   ASSESSMENT AND PLAN: 1.  Syncope. The patient does seem dehydrated with elevated creatinine compared to his last creatinine. Could be because of dehydration but, considering his symptoms of feeling well and the next moment passing out, suggests a possible cardiac cause. Also his blood sugars are low, which might be causing it.  I have been asked to do an Accu-Chek and now and this is at 56. We will put him on D5 normal saline and see how his Accu-Cheks do. He will be admitted on a tele floor. We will get an echocardiogram. His tele strip did suggest possible atrial flutter but looks like artifact. I have already discussed this with Dr. Ubaldo Glassing of cardiology. His EKG is fine. He does brady down into the low 50s but I doubt that is causing his syncope. Have to review his home medications when they are available to see if he is on any beta blockers or calcium channel blockers or other medications that are causing his bradycardia. Further workup and treatment depending on how his blood sugars do and what we find on tele and echocardiogram. We will also check HbA1c.  2.  Acute renal failure versus chronic kidney disease with worsening. The patient's last creatinine available is 1.3, presently is 2.3. We will put him on IV fluids and closely monitor. He mentions that he had his left renal stenosis surgery and complications 14 months back. Requested records from Dr. Lucky Cowboy and Dr. Juanell Fairly office to compare his creatinine and to get his home medication list.  3.  Hypoglycemia mild in the 40s. He is on D5. Will check cortisol level along with HbA1c. We will put him on Accu-Cheks q.4 hours while he is on D5.  4.  Leukocytosis. The patient  mentions that he was on prednisone recently for gout, which he had stopped. This could be because of that but with his chills, syncope, along with the hypoglycemia, we will have to rule out sepsis in this case or bacteremia. We will get blood cultures but no antibiotics at this time.  5.  Hypertension. The patient is presently in the low-normal blood pressure range. We will hold his medications.  6.  Chronic obstructive pulmonary disease with mild wheezing. We will put him on nebulizers.  I have counseled him to quit smoking for greater than 3 minutes.  7.  Deep vein thrombosis prophylaxis with heparin.  8.  CODE STATUS: Full code.   TIME SPENT: Today on this case was 55 minutes.   ____________________________ Leia Alf Kendricks Reap, MD srs:cs D: 01/15/2014 16:14:00 ET T: 01/15/2014 19:31:41 ET JOB#: [id number]  cc: Algernon Huxley, MD Leona Carry Hall Busing, MD Silvia Markuson R. Darvin Neighbours, MD, <Dictator>  Neita Carp MD ELECTRONICALLY SIGNED 01/22/2014 17:46

## 2015-01-31 NOTE — Discharge Summary (Signed)
PATIENT NAME:  Edward Singleton, Edward Singleton MR#:  [id number] DATE OF BIRTH:  [birth year]  DATE OF ADMISSION:  01/15/2014 DATE OF DISCHARGE:  01/16/2014  CONSULTATIONS: None.  DISCHARGE DIAGNOSES: Syncope, hypoglycemia, acute-on-chronic renal failure, bradycardia.   DISCHARGE MEDICATIONS:  1. Simvastatin 40 mg p.o. daily. 2. Percocet 10/325 one tablet every 4 hours as needed.  3. Benazepril 10 mg p.o. daily.  4. (allopurinol 150 mg p.o. daily.  5. Ambien 10 mg p.o. daily as needed.   The patient is advised to stop metoprolol 100 mg daily.   HOSPITAL COURSE: A 64 year old male patient of COPD, CKD stage III, history of splenectomy, renal artery stenosis, COPD, came in because of syncope. The patient's blood sugar was 60 and blood pressure was 91/61 on admission. The patient's CT head did not show any acute changes. X-ray of chest showed COPD but pneumonia. The patient was admitted to hospitalist service. The patient's syncope thought to be secondary to dehydration with hypoglycemia and hypotension. The patient also had slight worsening of his creatinine, thought to have acute renal failure with CKD stage III. He was started on IV fluids and the patient received 3 liters of  IV fluid   dextrose  because of hypoglycemia, then changed to normal saline. Blood cultures are negative. The patient the patient's creatinine was 2.37. On admission, BUN was 40 and the patient received fluids and at the time of discharge BUN was 29 and creatinine 1.85. The patient hypoglycemia also resolved with the fluids and blood sugar improved to 110. The patient does not have any diabetes but on the day of admission, the patient had a very light breakfast and he was outside so we thought hypoglycemia, likely related to poor p.o. intake. The patient's blood cultures have been negative and sepsis workup has been negative. The patient's hemoglobin A1c 5.6. He also has low magnesium which was replaced. The patient also had bradycardia. He was  on metoprolol at home and I have discontinuing that because his heart rate stayed around 58 to 60s and the patient's blood pressure also was 140/80 and the patient can follow up with his doctor regarding restarting the metoprolol and his doctor is Dr. Ezequiel Kayser.  PHYSICAL EXAMINATION:  DISCHARGE VITAL SIGNS: On the day of discharge: Heart rate 69, blood pressure 148/88, temperature 97, oxygen saturation 98%.  GENERAL: The patient is alert, awake, oriented.  CARDIOVASCULAR: S1, S2 regular.  LUNGS: Clear to auscultation. The patient does not have any dizziness.  NEUROLOGIC: Alert, awake, oriented. No focal neurologic deficits.  The patient stable to be discharged so we discharged him home and advised to follow up with his family doctor in 1 week.   TIME SPEND ON DISCHARGE PREPARATION: More than 30 minutes.    ____________________________ Epifanio Lesches, MD sk:lt D: 01/17/2014 21:22:03 ET T: 01/17/2014 22:18:30 ET JOB#: [id number]  cc: Epifanio Lesches, MD, <Dictator> Epifanio Lesches MD ELECTRONICALLY SIGNED 02/01/2014 7:51

## 2015-02-04 ENCOUNTER — Inpatient Hospital Stay: Payer: Self-pay | Admitting: Physical Medicine & Rehabilitation

## 2015-02-05 ENCOUNTER — Encounter
Admit: 2015-02-05 | Disposition: A | Discharge status: Home / Self Care | Payer: Self-pay | Attending: Orthopedic Surgery | Admitting: Orthopedic Surgery

## 2015-02-10 ENCOUNTER — Encounter: Payer: Self-pay | Admitting: Physical Medicine & Rehabilitation

## 2015-02-10 ENCOUNTER — Encounter: Payer: 59 | Attending: Physical Medicine & Rehabilitation | Admitting: Physical Medicine & Rehabilitation

## 2015-02-10 ENCOUNTER — Other Ambulatory Visit: Payer: Self-pay | Admitting: Physical Medicine & Rehabilitation

## 2015-02-10 VITALS — BP 213/119 | HR 82 | Resp 16

## 2015-02-10 DIAGNOSIS — Z5181 Encounter for therapeutic drug level monitoring: Secondary | ICD-10-CM | POA: Diagnosis not present

## 2015-02-10 DIAGNOSIS — F1010 Alcohol abuse, uncomplicated: Secondary | ICD-10-CM | POA: Diagnosis not present

## 2015-02-10 DIAGNOSIS — S3282XS Multiple fractures of pelvis without disruption of pelvic ring, sequela: Secondary | ICD-10-CM

## 2015-02-10 DIAGNOSIS — F1310 Sedative, hypnotic or anxiolytic abuse, uncomplicated: Secondary | ICD-10-CM | POA: Diagnosis not present

## 2015-02-10 DIAGNOSIS — G4700 Insomnia, unspecified: Secondary | ICD-10-CM

## 2015-02-10 DIAGNOSIS — S329XXS Fracture of unspecified parts of lumbosacral spine and pelvis, sequela: Secondary | ICD-10-CM | POA: Insufficient documentation

## 2015-02-10 DIAGNOSIS — S42402S Unspecified fracture of lower end of left humerus, sequela: Secondary | ICD-10-CM | POA: Insufficient documentation

## 2015-02-10 DIAGNOSIS — F411 Generalized anxiety disorder: Secondary | ICD-10-CM | POA: Diagnosis not present

## 2015-02-10 DIAGNOSIS — I10 Essential (primary) hypertension: Secondary | ICD-10-CM

## 2015-02-10 DIAGNOSIS — Z79899 Other long term (current) drug therapy: Secondary | ICD-10-CM | POA: Diagnosis not present

## 2015-02-10 DIAGNOSIS — G894 Chronic pain syndrome: Secondary | ICD-10-CM

## 2015-02-10 DIAGNOSIS — W19XXXS Unspecified fall, sequela: Secondary | ICD-10-CM

## 2015-02-10 MED ORDER — TRAZODONE HCL 50 MG PO TABS: 50.0000 mg | ORAL_TABLET | Freq: Every day | ORAL | Status: DC

## 2015-02-10 MED ORDER — METOPROLOL TARTRATE 25 MG PO TABS: 25.0000 mg | ORAL_TABLET | Freq: Two times a day (BID) | ORAL | Status: DC

## 2015-02-10 NOTE — Progress Notes (Signed)
Subjective:    Patient ID: Edward Singleton, male    DOB: Dec 10, 1950, 64 y.o.   MRN: [id number]  HPI Overall Edward Singleton is progressing. His sisters remain highly involved. He is very independent around home. He is using a cane for gait. He's off the oxycodone and xanax currently. He has pain but it is controllable. He would like to get back to work at some point, but realizes he's not ready. 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 His job requires him to lift from 25-40lbs at times, often up stairs. Family notices some memory deficits at times.   He is smoking 2 cigarettes per day. He claims not to be drinking. He is off most of his meds.  Sleep remains a problem   Pain Inventory Average Pain 4 Pain Right Now 4 My pain is constant  In the last 24 hours, has pain interfered with the following? General activity 4 Relation with others 4 Enjoyment of life 4 What TIME of day is your pain at its worst? all Sleep (in general) Fair  Pain is worse with: walking, bending, sitting and inactivity Pain improves with: medication Relief from Meds: 6  Mobility walk without assistance use a cane how many minutes can you walk? 30 ability to climb steps?  yes do you drive?  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  no  Physicians involved in your care Any changes since last visit?  no   History reviewed. No pertinent family history. History   Social History  . Marital Status: Single    Spouse Name: N/A  . Number of Children: N/A  . Years of Education: N/A   Social History Main Topics  . Smoking status: Current Every Day Smoker  . Smokeless tobacco: Not on file  . Alcohol Use: Yes  . Drug Use: Not on file  . Sexual Activity: Not on file   Other Topics Concern  . None   Social  History Narrative   Past Surgical History  Procedure Laterality Date  . Nephrectomy    . Orif humerus fracture Left 11/18/2014    Procedure: OPEN REDUCTION INTERNAL FIXATION (ORIF) DISTAL HUMERUS FRACTURE;  Surgeon: Rozanna Box, MD;  Location: [facility];  Service: Orthopedics;  Laterality: Left;   Past Medical History  Diagnosis Date  . Hypertension   . Hypercholesteremia   . Gout    BP 213/119 mmHg  Pulse 82  Resp 16  SpO2 100%  Opioid Risk Score:   Fall Risk Score: Moderate Fall Risk (6-13 points) (previously educated na given handout)`1  Depression screen PHQ 2/9  No flowsheet data found.   Review of Systems  Genitourinary: Positive for dysuria.  Musculoskeletal: Positive for gait problem.  Neurological: Positive for weakness.  Hematological: Bruises/bleeds easily.  All other systems reviewed and are negative.      Objective:   Physical Exam  Head: Normocephalic.  Eyes: EOM are normal.  Neck: Normal range of motion.  Cardiovascular: Normal rate and regular rhythm.  Respiratory: Effort normal and breath sounds normal. No respiratory distress.  GI: Soft. Bowel sounds are normal. He exhibits no distension.  Musculoskeletal:  Left upper extremity ROM 90-95 in flexion and almost full extension extension--cannot touch the back of his head. Antalgic with gait LLE but improved Neurological: He  is alert.  Patient is oriented to person and hospital. Skin: Skin is warm and dry.  Scab left elbow with scar.  Psychiatric:  No evidence of lability or agitation. Calm and collected   Assessment/Plan: 1. Functional deficits secondary to multitrauma after a fall with left distal humerus fracture, pelvic fx's status post ORIF humerus fx. Nonweightbearing left upper extremity and splint was discontinued 11/26/2015. Left C1 pelvic ring fracture weightbearing as tolerated. -outpt PT and OT  -ortho follow up 2.DVT Prophylaxis/Anticoagulation:  3. Pain  Management: tylenol prn -heat, ice, modalities  4. Mood/agitation:  5. Insomnia: trazodone for sleep. 6. HTN: begin metoprolol 25mg  bid. Asked that they buy a cuff so that they check at home. They are to call me with any further issues.     Follow up with me 2 months. Thirty minutes of face to face patient care time were spent during this visit. All questions were encouraged and answered.

## 2015-02-10 NOTE — Patient Instructions (Signed)
PLEASE CALL ME WITH ANY PROBLEMS OR QUESTIONS (#[phone]).      

## 2015-02-11 ENCOUNTER — Ambulatory Visit: Payer: Worker's Compensation | Attending: Physical Medicine & Rehabilitation

## 2015-02-11 DIAGNOSIS — R531 Weakness: Secondary | ICD-10-CM | POA: Insufficient documentation

## 2015-02-11 DIAGNOSIS — X58XXXD Exposure to other specified factors, subsequent encounter: Secondary | ICD-10-CM | POA: Insufficient documentation

## 2015-02-11 DIAGNOSIS — R262 Difficulty in walking, not elsewhere classified: Secondary | ICD-10-CM | POA: Insufficient documentation

## 2015-02-11 DIAGNOSIS — S329XXD Fracture of unspecified parts of lumbosacral spine and pelvis, subsequent encounter for fracture with routine healing: Secondary | ICD-10-CM | POA: Insufficient documentation

## 2015-02-11 LAB — PMP ALCOHOL METABOLITE (ETG)

## 2015-02-11 NOTE — Therapy (Signed)
[facility] [address], [place], [address] Phone: [phone]   Fax:  [phone]  Physical Therapy Evaluation  Patient Details  Name: Edward Singleton MRN: [id number] Date of Birth: 1951/02/25 Referring Provider:  Meredith Staggers, MD  Encounter Date: 02/11/2015      PT End of Session - 02/11/15 1618    Visit Number 1   Number of Visits 13   Date for PT Re-Evaluation 03/25/15   PT Start Time [id number]   PT Stop Time 1417   PT Time Calculation (min) 64 min   Equipment Utilized During Treatment Other (comment)  SPC R side   Activity Tolerance Patient tolerated treatment well   Behavior During Therapy [facility] for tasks assessed/performed      Past Medical History  Diagnosis Date  . Hypertension   . Hypercholesteremia   . Gout   . Pelvic fracture 11/17/2014    Past Surgical History  Procedure Laterality Date  . Nephrectomy    . Orif humerus fracture Left 11/18/2014    Procedure: OPEN REDUCTION INTERNAL FIXATION (ORIF) DISTAL HUMERUS FRACTURE;  Surgeon: Rozanna Box, MD;  Location: [facility];  Service: Orthopedics;  Laterality: Left;    There were no vitals filed for this visit.  Visit Diagnosis:  Pelvic fracture, with routine healing, subsequent encounter - Plan: PT plan of care cert/re-cert  Weakness - Plan: PT plan of care cert/re-cert  Difficulty walking - Plan: PT plan of care cert/re-cert      Subjective Assessment - 02/11/15 1317    Subjective Patient was leaving his house and going down his steps, slipped and fell down the steps onto his L side fracturing his L elbow and L hip. Had and ORIF surgery for his L elbow.    Did not have surgery for L hip. Had home health PT for his L elbow for a month. Sees Dr. Marcelino Scot (bone doctor) on Feb 18, 2015. Currently walks with a SPC on his R side.  Has not used a rw. Patient states that he needs to be able to lift 50 lbs for wor.k.  Patient also has a referral for  OT for his L elbow and wrist. Transfering care for L UE to OT.   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 Yes   Pain Score 6   10/10 at most (touching area, sit down wrong); Constant 5-6/10 since fall   Pain Location Hip   Pain Orientation Left  posterior hip    Pain Descriptors / Indicators Constant   Pain Frequency Constant   Aggravating Factors  touching area, sitting, laying on his back and L side, walking   Pain Relieving Factors Lying on his R side  aspercreme      Objectives:  Directed patient with standing L hip abduction isometrics 10x5 seconds,   Standing glute max squeezes 10x5 seconds for 2 sets,   Standing L hip flexion onto a step 5x,   Standing L and R weight shifting 10x2 each direction,  Standing forward and back weight shifting with L LE in front 10x2,   Standing heel toe raises 10x each way.     Tolerated exercises with slight increase in L hip discomfort which decreases with rest. Improved exercise technique, movement at target joints, use of target  muscles after mod verbal, visual, tactile cues.       [facility] PT Assessment - 02/11/15 0001    Assessment   Medical Diagnosis L pelvic fracture   Onset Date 11/17/14   Next MD Visit Feb 18, 2015  Dr Marcelino Scot   Prior Therapy Home health PT for elbow   Prior Function   Vocation Requirements Full function L elbow and hip. Independent gait. Able to carry 20-50 lb items up and down stairs.    Observation/Other Assessments   Lower Extremity Functional Scale  18/80   Other:   Other/ Comments 6 minute walk test: 828 ft   Other:   Other/Comments TUG using SPC: 14.07 sec, 17.37 sec, 17.12 sec   Posture/Postural Control   Posture Comments Standign posture: Bilaterally protracted shoulders and neck, R lateral lean, R lateral shift, L shoulder lower, decreased  lordosis, increased kyphosis, R weight shifting, L shoulder in IR,  L elbow in flexion   AROM   Overall AROM  Within functional limits for tasks performed   Strength   Overall Strength Comments hip flexion: R 4/5, L 4-/5 (with pain); hip ER R 4/5, L 3-/5 with pain; knee extension R 5/5, L 4+/5; knee flexion R 4+/5, L 4/5; seated hip abd/ER (hips less than 90 degrees flexion ) R 4/5, L 4-/5   Ambulation/Gait   Gait Comments Ambulates with SPC R side, R lateral lean, antalgic, decreased stance L LE,                           PT Education - 02/11/15 1612    Education provided Yes   Education Details Ther-ex, HEP   Person(s) Educated Patient   Methods Explanation;Demonstration;Tactile cues;Verbal cues   Comprehension Verbalized understanding;Returned demonstration;Verbal cues required;Tactile cues required             PT Long Term Goals - 02/11/15 1626    PT LONG TERM GOAL #1   Title Patient will be independent with his home exercise program to improve LE strength, decrease pain, and improve ability to ambulate.    Time 6   Period Weeks   Status New   PT LONG TERM GOAL #2   Title Patient will improve L LE strength by 1/2 MMT grade to promote ability to ambulate, negotiate stairs.   Time 6   Period Weeks   Status New   PT LONG TERM GOAL #3   Title Patient will be able to ambulate at least 500 ft without AD and without LOB to promote mobility.    Time 6   Period Weeks   Status New   PT LONG TERM GOAL #4   Title Patient will improve his LEFS score by at least 9 points as a demonstration of improved function.    Time 6   Period Weeks   Status New               Plan - 02/11/15 1621    Clinical Impression Statement Patient is a 64 year old male who came to physical therapy secondary to L pelvic fracture due to a fall. He also presents with L hip pain, altered gait pattern and posture, LE weakness, decreased balance, endurance and gait speed, TTP to L  posterior hip, and difficulty standing, walking, sitting, and lying  down. Patient will benefit from skilled physical therapy services to address the aforementioned deficits. Thank you for your referral.    Pt will benefit from skilled  therapeutic intervention in order to improve on the following deficits Abnormal gait;Decreased strength;Pain;Difficulty walking;Decreased balance;Decreased endurance   Rehab Potential Good   Clinical Impairments Affecting Rehab Potential current smoker, may affect healing   PT Frequency 2x / week   PT Duration 6 weeks   PT Treatment/Interventions ADLs/Self Care Home Management;Ultrasound;Functional mobility training;Aquatic Therapy;Therapeutic activities;Manual techniques;Therapeutic exercise;Cryotherapy;Balance training;Electrical Stimulation;Neuromuscular re-education;Moist Heat;Gait training;Passive range of motion;Stair training;Patient/family education   PT Next Visit Plan hip isometrics, A/AROM, gentle strengthening, gait, balance, modalities PRN.    Consulted and Agree with Plan of Care Patient         Problem List Patient Active Problem List   Diagnosis Date Noted  . Insomnia 02/10/2015  . Memory loss   . Bacterial UTI 12/01/2014  . Acute blood loss anemia 11/25/2014  . Pelvic fracture 11/25/2014  . Protein-calorie malnutrition, severe 11/19/2014  . Closed fracture of left distal humerus 11/18/2014  . Multiple stable closed lateral compression fractures of pelvis, Left LC1 pelvic ring fx  11/18/2014  . Fall with multiple fractures 11/17/2014  . HTN (hypertension) 11/17/2014  . Acute on PRESUMED chronic kidney failure 11/17/2014  . Benzodiazepine abuse 11/17/2014  . Alcohol abuse 11/17/2014  . Dyslipidemia 11/17/2014  . History of nonadherence to medical treatment 11/17/2014  . Macrocytic anemia 11/17/2014    Laygo,Miguel 02/11/2015, 4:47 PM  [facility] [address], [place], [address] Phone: [phone]   Fax:  [phone]

## 2015-02-11 NOTE — Patient Instructions (Signed)
Gave standing glute max squeezes as part of his home exercise program. Patient demonstrated and verbalized understanding.    Improved exercise technique, movement at target joints, use of target muscles after mod verbal, visual, tactile cues.

## 2015-02-14 LAB — ETHYL GLUCURONIDE, URINE
ETGU: 109507 ng/mL — AB (ref <500)
ETHYL SULFATE (ETS): 19002 ng/mL — AB (ref <100)

## 2015-02-14 LAB — OPIATES/OPIOIDS (LC/MS-MS)
Codeine Urine: NEGATIVE ng/mL (ref <50)
HYDROCODONE: 3522 ng/mL — AB (ref <50)
HYDROMORPHONE: 413 ng/mL — AB (ref <50)
Morphine Urine: NEGATIVE ng/mL (ref <50)
NOROXYCODONE, UR: NEGATIVE ng/mL — AB (ref <50)
Norhydrocodone, Ur: 1858 ng/mL — AB (ref <50)
OXYMORPHONE, URINE: NEGATIVE ng/mL — AB (ref <50)
Oxycodone, ur: NEGATIVE ng/mL — AB (ref <50)

## 2015-02-16 ENCOUNTER — Ambulatory Visit: Payer: Worker's Compensation

## 2015-02-16 DIAGNOSIS — R531 Weakness: Secondary | ICD-10-CM

## 2015-02-16 DIAGNOSIS — S329XXD Fracture of unspecified parts of lumbosacral spine and pelvis, subsequent encounter for fracture with routine healing: Secondary | ICD-10-CM

## 2015-02-16 DIAGNOSIS — R262 Difficulty in walking, not elsewhere classified: Secondary | ICD-10-CM

## 2015-02-16 NOTE — Therapy (Signed)
[facility] [address], [place], [address] Phone: [phone]   Fax:  [phone]  Physical Therapy Treatment  Patient Details  Name: Edward Singleton MRN: [id number] Date of Birth: Oct 15, 1950 Referring Provider:  Meredith Staggers, MD  Encounter Date: 02/16/2015      PT End of Session - 02/16/15 1417    Visit Number 2   Number of Visits 13   Date for PT Re-Evaluation 03/25/15   PT Start Time 1302   PT Stop Time 1400   PT Time Calculation (min) 58 min   Equipment Utilized During Treatment Other (comment)  SPC R side   Activity Tolerance Patient tolerated treatment well;Patient limited by pain   Behavior During Therapy [facility] for tasks assessed/performed      Past Medical History  Diagnosis Date  . Hypertension   . Hypercholesteremia   . Gout   . Pelvic fracture 11/17/2014    Past Surgical History  Procedure Laterality Date  . Nephrectomy    . Orif humerus fracture Left 11/18/2014    Procedure: OPEN REDUCTION INTERNAL FIXATION (ORIF) DISTAL HUMERUS FRACTURE;  Surgeon: Rozanna Box, MD;  Location: [place];  Service: Orthopedics;  Laterality: Left;    There were no vitals filed for this visit.  Visit Diagnosis:  Weakness  Difficulty walking  Pelvic fracture, with routine healing, subsequent encounter      Subjective Assessment - 02/16/15 1302    Subjective 7/10 L pelvic pain today. Woke up at 4 am this morning with the 7/10 pain.    Patient Stated Goals "I'd like for it to not hurt and do away with this cane."   Currently in Pain? Yes   Pain Score 7    Pain Location Hip   Pain Orientation Left   Pain Descriptors / Indicators Constant   Pain Type Chronic pain   Pain Onset Today  Pain increased to 7/10 at 4 am this morning   Pain Frequency Constant   Aggravating Factors  touching area, sitting, laying on his back and left side, walking   Pain Relieving Factors lying on his R side                          [facility] Adult PT Treatment/Exercise - 02/16/15 0001    Knee/Hip Exercises: Standing   Other Standing Knee Exercises Directed patient with standing heel/toe raises 10x3, standing L hip flexion 5x3, standing L hip extension 5x, standing R weight shifting 10x3 (with emphasis on use of L glute med) 10x3.   Knee/Hip Exercises: Sidelying   Other Sidelying Knee Exercises Directed patient with R S/L L hip adduction pillow squeeze 10x5 seconds for 3 sets, L LE clam shells 10x2. Recommended for patient to try sleeping with pillows between his knees to see if it helps decrease his L hip pain. Patient verbalized understanding.    Acupuncturist Location L SI joint, L lateral hip   Electrical Stimulation Action Pain control   Electrical Stimulation Parameters High Volt 100 V x 15 min with patient in R S/L                PT Education - 02/16/15 1350    Education Details Ther-ex, sleep positioning.   Person(s) Educated Patient   Methods Explanation;Demonstration;Tactile cues;Verbal cues   Comprehension Verbalized understanding;Returned demonstration             PT Long  Term Goals - 02/11/15 1626    PT LONG TERM GOAL #1   Title Patient will be independent with his home exercise program to improve LE strength, decrease pain, and improve ability to ambulate.    Time 6   Period Weeks   Status New   PT LONG TERM GOAL #2   Title Patient will improve L LE strength by 1/2 MMT grade to promote ability to ambulate, negotiate stairs.   Time 6   Period Weeks   Status New   PT LONG TERM GOAL #3   Title Patient will be able to ambulate at least 500 ft without AD and without LOB to promote mobility.    Time 6   Period Weeks   Status New   PT LONG TERM GOAL #4   Title Patient will improve his LEFS score by at least 9 points as a demonstration of improved function.    Time 6   Period Weeks   Status New                Plan - 02/16/15 1419    Clinical Impression Statement TTP L SI joint. Some increase in L hip and pelvic discomfort with exercises which decreased with rest after a few seconds. Decreaesd L pelvic pain to 5/10 after use of E-stim high volt.    Pt will benefit from skilled therapeutic intervention in order to improve on the following deficits Abnormal gait;Decreased strength;Pain;Difficulty walking;Decreased balance;Decreased endurance   Rehab Potential Good   Clinical Impairments Affecting Rehab Potential current smoker, may affect healing   PT Frequency 2x / week   PT Duration 6 weeks   PT Treatment/Interventions ADLs/Self Care Home Management;Ultrasound;Functional mobility training;Aquatic Therapy;Therapeutic activities;Manual techniques;Therapeutic exercise;Cryotherapy;Balance training;Electrical Stimulation;Neuromuscular re-education;Moist Heat;Gait training;Passive range of motion;Stair training;Patient/family education   PT Next Visit Plan hip isometrics, A/AROM, gentle strengthening, gait, balance, modalities PRN.    Consulted and Agree with Plan of Care Patient        Problem List Patient Active Problem List   Diagnosis Date Noted  . Insomnia 02/10/2015  . Memory loss   . Bacterial UTI 12/01/2014  . Acute blood loss anemia 11/25/2014  . Pelvic fracture 11/25/2014  . Protein-calorie malnutrition, severe 11/19/2014  . Closed fracture of left distal humerus 11/18/2014  . Multiple stable closed lateral compression fractures of pelvis, Left LC1 pelvic ring fx  11/18/2014  . Fall with multiple fractures 11/17/2014  . HTN (hypertension) 11/17/2014  . Acute on PRESUMED chronic kidney failure 11/17/2014  . Benzodiazepine abuse 11/17/2014  . Alcohol abuse 11/17/2014  . Dyslipidemia 11/17/2014  . History of nonadherence to medical treatment 11/17/2014  . Macrocytic anemia 11/17/2014    Edward Singleton 02/16/2015, 2:26 PM  [facility] [address], [place], [address] Phone: [phone]   Fax:  [phone]

## 2015-02-16 NOTE — Patient Instructions (Signed)
Improved exercise technique, movement at target joints, use of target muscles after mod verbal, visual, tactile cues.   

## 2015-02-17 LAB — PRESCRIPTION MONITORING PROFILE (SOLSTAS)
AMPHETAMINE/METH: NEGATIVE ng/mL
BUPRENORPHINE, URINE: NEGATIVE ng/mL
Barbiturate Screen, Urine: NEGATIVE ng/mL
Benzodiazepine Screen, Urine: NEGATIVE ng/mL
CANNABINOID SCRN UR: NEGATIVE ng/mL
COCAINE METABOLITES: NEGATIVE ng/mL
Carisoprodol, Urine: NEGATIVE ng/mL
Creatinine, Urine: 123.88 mg/dL (ref >20.0)
FENTANYL URINE: NEGATIVE ng/mL
MDMA URINE: NEGATIVE ng/mL
MEPERIDINE UR: NEGATIVE ng/mL
METHADONE SCREEN, URINE: NEGATIVE ng/mL
Nitrites, Initial: NEGATIVE ug/mL
Oxycodone Screen, Ur: NEGATIVE ng/mL
Propoxyphene: NEGATIVE ng/mL
Tapentadol, urine: NEGATIVE ng/mL
Tramadol Scrn, Ur: NEGATIVE ng/mL
Zolpidem, Urine: NEGATIVE ng/mL
pH, Initial: 5.4 pH (ref 4.5–8.9)

## 2015-02-18 ENCOUNTER — Ambulatory Visit: Payer: Worker's Compensation

## 2015-02-18 DIAGNOSIS — S329XXD Fracture of unspecified parts of lumbosacral spine and pelvis, subsequent encounter for fracture with routine healing: Secondary | ICD-10-CM

## 2015-02-18 DIAGNOSIS — R262 Difficulty in walking, not elsewhere classified: Secondary | ICD-10-CM

## 2015-02-18 DIAGNOSIS — R531 Weakness: Secondary | ICD-10-CM

## 2015-02-18 NOTE — Therapy (Signed)
[facility] [address] [place], [address] Phone: [phone]   Fax:  [phone]  Physical Therapy Treatment  Patient Details  Name: Edward Singleton MRN: [id number] Date of Birth: 14-Aug-1951 Referring Provider:  [place], MD  Encounter Date: 02/18/2015      PT End of Session - 02/18/15 1644    Visit Number 3   Number of Visits 13   Date for PT Re-Evaluation 03/25/15   PT Start Time [id number]   PT Stop Time 1637   PT Time Calculation (min) 63 min   Equipment Utilized During Treatment Other (comment)  SPC R side   Activity Tolerance Patient tolerated treatment well;Patient limited by pain   Behavior During Therapy [facility] for tasks assessed/performed      Past Medical History  Diagnosis Date  . Hypertension   . Hypercholesteremia   . Gout   . Pelvic fracture 11/17/2014    Past Surgical History  Procedure Laterality Date  . Nephrectomy    . Orif humerus fracture Left 11/18/2014    Procedure: OPEN REDUCTION INTERNAL FIXATION (ORIF) DISTAL HUMERUS FRACTURE;  Surgeon: Rozanna Box, MD;  Location: [facility];  Service: Orthopedics;  Laterality: Left;    There were no vitals filed for this visit.  Visit Diagnosis:  Pelvic fracture, with routine healing, subsequent encounter  Difficulty walking  Weakness      Subjective Assessment - 02/18/15 1534    Subjective 5/10 L pelvis current. Feels better. Had an appointment with Dr Marcelino Scot this morning. No x-rays. MD told him to continue PT and come back April 08, 2015                 [facility] Adult PT Treatment/Exercise - 02/18/15 0001    Knee/Hip Exercises: Sidelying   Other Sidelying Knee Exercises Directed patient with R S/L hip adductor pillow squeeze 10x3 for 5 seconds, L clam shell (small movement 10x3, bilateral ankle DF/PF 10x3, R S/L L leg extension 10x3, assisted L hip extension with glute max squeeze 10x3, R S/L L hip IR 10x3, standing R LE  weight shifting (emphasis on glute med muscle) 10x3, standing L LE hip flexion 10x3 (emphasis on pelvic control), standing L hip extension (small movement) 10x2, standing mini step backwards (weight on L LE) 10x3.    Acupuncturist Location L SI joint, L lateral hip   Electrical Stimulation Action Pain control   Electrical Stimulation Parameters High Volt at 105 V for 15 min in R S/L            PT Education - 02/18/15 1643    Education provided Yes   Education Details Ther-ex   Person(s) Educated Patient   Methods Explanation;Demonstration;Tactile cues;Verbal cues   Comprehension Verbalized understanding;Returned demonstration;Verbal cues required;Tactile cues required             PT Long Term Goals - 02/11/15 1626    PT LONG TERM GOAL #1   Title Patient will be independent with his home exercise program to improve LE strength, decrease pain, and improve ability to ambulate.    Time 6   Period Weeks   Status New   PT LONG TERM GOAL #2   Title Patient will improve L LE strength by 1/2 MMT grade to promote ability to ambulate, negotiate stairs.   Time 6   Period Weeks   Status New   PT LONG TERM GOAL #3   Title Patient will  be able to ambulate at least 500 ft without AD and without LOB to promote mobility.    Time 6   Period Weeks   Status New   PT LONG TERM GOAL #4   Title Patient will improve his LEFS score by at least 9 points as a demonstration of improved function.    Time 6   Period Weeks   Status New               Plan - 02/18/15 1645    Clinical Impression Statement Improved exercise tolerance today. Slight increase in L hip discomfort with closed chain exercise which decreased with rest and E-stim. Decreased L hip/pelvic pain to 4-5/10 after high volt e-stim. Glute med and max muscle contraction palpated with exercises. Possible L LE neural tension and L piriformis muscle over use.    Pt will benefit from skilled  therapeutic intervention in order to improve on the following deficits Abnormal gait;Decreased strength;Pain;Difficulty walking;Decreased balance;Decreased endurance   Rehab Potential Good   Clinical Impairments Affecting Rehab Potential current smoker, may affect healing   PT Frequency 2x / week   PT Duration 6 weeks   PT Treatment/Interventions ADLs/Self Care Home Management;Ultrasound;Functional mobility training;Aquatic Therapy;Therapeutic activities;Manual techniques;Therapeutic exercise;Cryotherapy;Balance training;Electrical Stimulation;Neuromuscular re-education;Moist Heat;Gait training;Passive range of motion;Stair training;Patient/family education   PT Next Visit Plan A/AROM, gentle strengthening, gait, balance, modalities PRN.    Consulted and Agree with Plan of Care Patient        Problem List Patient Active Problem List   Diagnosis Date Noted  . Insomnia 02/10/2015  . Memory loss   . Bacterial UTI 12/01/2014  . Acute blood loss anemia 11/25/2014  . Pelvic fracture 11/25/2014  . Protein-calorie malnutrition, severe 11/19/2014  . Closed fracture of left distal humerus 11/18/2014  . Multiple stable closed lateral compression fractures of pelvis, Left LC1 pelvic ring fx  11/18/2014  . Fall with multiple fractures 11/17/2014  . HTN (hypertension) 11/17/2014  . Acute on PRESUMED chronic kidney failure 11/17/2014  . Benzodiazepine abuse 11/17/2014  . Alcohol abuse 11/17/2014  . Dyslipidemia 11/17/2014  . History of nonadherence to medical treatment 11/17/2014  . Macrocytic anemia 11/17/2014    Nykeria Mealing 02/18/2015, 4:58 PM  [facility] [address] [place], [address] Phone: [phone]   Fax:  [phone]

## 2015-02-18 NOTE — Patient Instructions (Signed)
Improved exercise technique, decreased compensation, movement at target joints, use of target muscles after mod verbal, visual, tactile cues.

## 2015-02-23 ENCOUNTER — Ambulatory Visit: Payer: Worker's Compensation

## 2015-02-23 ENCOUNTER — Ambulatory Visit: Payer: Worker's Compensation | Admitting: Occupational Therapy

## 2015-02-25 NOTE — Progress Notes (Signed)
Urine drug screen for this encounter is inconsistent .  He reported he had not had any pain medication (oxycodone) in about a month but there is presence of unreported hydrocodone and its metabolite.  Also very high level of alcohol.

## 2015-02-26 ENCOUNTER — Ambulatory Visit: Payer: 59 | Admitting: Occupational Therapy

## 2015-02-26 ENCOUNTER — Ambulatory Visit: Payer: Worker's Compensation | Admitting: Occupational Therapy

## 2015-02-26 ENCOUNTER — Ambulatory Visit: Payer: Worker's Compensation

## 2015-03-03 ENCOUNTER — Ambulatory Visit: Payer: Worker's Compensation

## 2015-03-03 ENCOUNTER — Ambulatory Visit: Payer: Worker's Compensation | Admitting: Occupational Therapy

## 2015-03-04 ENCOUNTER — Encounter: Payer: Worker's Compensation | Admitting: Occupational Therapy

## 2015-03-04 ENCOUNTER — Ambulatory Visit: Payer: 59 | Admitting: Occupational Therapy

## 2015-03-10 ENCOUNTER — Ambulatory Visit: Payer: Worker's Compensation | Admitting: Occupational Therapy

## 2015-03-10 ENCOUNTER — Ambulatory Visit: Payer: Worker's Compensation

## 2015-03-17 ENCOUNTER — Ambulatory Visit: Payer: 59 | Attending: Physical Medicine & Rehabilitation

## 2015-03-19 ENCOUNTER — Ambulatory Visit: Payer: 59 | Admitting: Occupational Therapy

## 2015-03-24 ENCOUNTER — Emergency Department: Payer: 59

## 2015-03-24 ENCOUNTER — Emergency Department
Admission: EM | Admit: 2015-03-24 | Discharge: 2015-03-25 | Disposition: A | Discharge status: Home / Self Care | Payer: 59 | Attending: Student | Admitting: Student

## 2015-03-24 ENCOUNTER — Encounter: Payer: Self-pay | Admitting: Emergency Medicine

## 2015-03-24 ENCOUNTER — Other Ambulatory Visit: Payer: Self-pay

## 2015-03-24 DIAGNOSIS — Z720 Tobacco use: Secondary | ICD-10-CM | POA: Insufficient documentation

## 2015-03-24 DIAGNOSIS — R4182 Altered mental status, unspecified: Secondary | ICD-10-CM

## 2015-03-24 DIAGNOSIS — Z79899 Other long term (current) drug therapy: Secondary | ICD-10-CM | POA: Insufficient documentation

## 2015-03-24 DIAGNOSIS — I10 Essential (primary) hypertension: Secondary | ICD-10-CM | POA: Insufficient documentation

## 2015-03-24 DIAGNOSIS — E785 Hyperlipidemia, unspecified: Secondary | ICD-10-CM | POA: Diagnosis not present

## 2015-03-24 DIAGNOSIS — F10129 Alcohol abuse with intoxication, unspecified: Secondary | ICD-10-CM | POA: Insufficient documentation

## 2015-03-24 DIAGNOSIS — F10929 Alcohol use, unspecified with intoxication, unspecified: Secondary | ICD-10-CM

## 2015-03-24 LAB — COMPREHENSIVE METABOLIC PANEL
ALT: 19 U/L (ref 17–63)
AST: 53 U/L — ABNORMAL HIGH (ref 15–41)
Albumin: 3.7 g/dL (ref 3.5–5.0)
Alkaline Phosphatase: 228 U/L — ABNORMAL HIGH (ref 38–126)
Anion gap: 16 — ABNORMAL HIGH (ref 5–15)
BILIRUBIN TOTAL: 1.1 mg/dL (ref 0.3–1.2)
BUN: 19 mg/dL (ref 6–20)
CO2: 24 mmol/L (ref 22–32)
CREATININE: 1.31 mg/dL — AB (ref 0.61–1.24)
Calcium: 7.9 mg/dL — ABNORMAL LOW (ref 8.9–10.3)
Chloride: 91 mmol/L — ABNORMAL LOW (ref 101–111)
GFR calc Af Amer: >60 mL/min (ref >60)
GFR calc non Af Amer: 57 mL/min — ABNORMAL LOW (ref >60)
Glucose, Bld: 143 mg/dL — ABNORMAL HIGH (ref 65–99)
Potassium: 3.1 mmol/L — ABNORMAL LOW (ref 3.5–5.1)

## 2015-03-24 LAB — URINE DRUG SCREEN, QUALITATIVE (ARMC ONLY)
AMPHETAMINES, UR SCREEN: NOT DETECTED
Barbiturates, Ur Screen: NOT DETECTED
Benzodiazepine, Ur Scrn: NOT DETECTED
CANNABINOID 50 NG, UR ~~LOC~~: NOT DETECTED
COCAINE METABOLITE, UR ~~LOC~~: NOT DETECTED
MDMA (ECSTASY) UR SCREEN: NOT DETECTED
METHADONE SCREEN, URINE: NOT DETECTED
OPIATE, UR SCREEN: NOT DETECTED
Phencyclidine (PCP) Ur S: NOT DETECTED
Tricyclic, Ur Screen: NOT DETECTED

## 2015-03-24 LAB — ETHANOL: Alcohol, Ethyl (B): 428 mg/dL (ref <5)

## 2015-03-24 LAB — CK: Total CK: 139 U/L (ref 49–397)

## 2015-03-24 LAB — TROPONIN I: Troponin I: 0.03 ng/mL (ref <0.031)

## 2015-03-24 LAB — PROTIME-INR
INR: 0.93
PROTHROMBIN TIME: 12.7 s (ref 11.4–15.0)

## 2015-03-24 LAB — URINALYSIS COMPLETE WITH MICROSCOPIC (ARMC ONLY)
Bilirubin Urine: NEGATIVE
GLUCOSE, UA: NEGATIVE mg/dL
Ketones, ur: NEGATIVE mg/dL
Leukocytes, UA: NEGATIVE
NITRITE: NEGATIVE
Protein, ur: NEGATIVE mg/dL
RBC / HPF: NONE SEEN RBC/hpf (ref 0–5)
SQUAMOUS EPITHELIAL / LPF: NONE SEEN
Specific Gravity, Urine: 1.004 — ABNORMAL LOW (ref 1.005–1.030)
pH: 6 (ref 5.0–8.0)

## 2015-03-24 LAB — CBC WITH DIFFERENTIAL/PLATELET
BASOS ABS: 0.1 10*3/uL (ref 0–0.1)
BASOS PCT: 1 %
EOS PCT: 0 %
Eosinophils Absolute: 0 10*3/uL (ref 0–0.7)
HEMATOCRIT: 37.7 % — AB (ref 40.0–52.0)
Hemoglobin: 13.2 g/dL (ref 13.0–18.0)
Lymphocytes Relative: 25 %
Lymphs Abs: 2.7 10*3/uL (ref 1.0–3.6)
MCH: 34.6 pg — ABNORMAL HIGH (ref 26.0–34.0)
MCHC: 35 g/dL (ref 32.0–36.0)
MCV: 98.7 fL (ref 80.0–100.0)
MONO ABS: 1 10*3/uL (ref 0.2–1.0)
Monocytes Relative: 9 %
Neutro Abs: 7.3 10*3/uL — ABNORMAL HIGH (ref 1.4–6.5)
Neutrophils Relative %: 65 %
Platelets: 211 10*3/uL (ref 150–440)
RBC: 3.82 MIL/uL — ABNORMAL LOW (ref 4.40–5.90)
RDW: 17.1 % — AB (ref 11.5–14.5)
WBC: 11.1 10*3/uL — ABNORMAL HIGH (ref 3.8–10.6)

## 2015-03-24 LAB — ACETAMINOPHEN LEVEL

## 2015-03-24 LAB — MAGNESIUM: MAGNESIUM: 1.7 mg/dL (ref 1.7–2.4)

## 2015-03-24 LAB — SALICYLATE LEVEL

## 2015-03-24 MED ORDER — THIAMINE HCL 100 MG/ML IJ SOLN: 100.0000 mg | Freq: Every day | INTRAMUSCULAR | Status: DC

## 2015-03-24 MED ORDER — VITAMIN B-1 100 MG PO TABS
100.0000 mg | ORAL_TABLET | Freq: Every day | ORAL | Status: DC
  Administered 2015-03-25: 100 mg via ORAL

## 2015-03-24 MED ORDER — LORAZEPAM 2 MG/ML IJ SOLN
1.0000 mg | Freq: Once | INTRAMUSCULAR | Status: AC
  Administered 2015-03-24: 1 mg via INTRAVENOUS

## 2015-03-24 MED ORDER — THIAMINE HCL 100 MG/ML IJ SOLN
INTRAVENOUS | Status: AC
  Administered 2015-03-24: 23:00:00 via INTRAVENOUS
  Filled 2015-03-24: qty 1000

## 2015-03-24 MED ORDER — HALOPERIDOL LACTATE 5 MG/ML IJ SOLN
5.0000 mg | Freq: Once | INTRAMUSCULAR | Status: AC
  Administered 2015-03-24: 5 mg via INTRAVENOUS

## 2015-03-24 MED ORDER — DIPHENHYDRAMINE HCL 50 MG/ML IJ SOLN
25.0000 mg | Freq: Once | INTRAMUSCULAR | Status: AC
  Administered 2015-03-24: 25 mg via INTRAVENOUS

## 2015-03-24 MED ORDER — LORAZEPAM 2 MG PO TABS
0.0000 mg | ORAL_TABLET | Freq: Four times a day (QID) | ORAL | Status: DC
  Administered 2015-03-25 (×2): 2 mg via ORAL

## 2015-03-24 MED ORDER — DIPHENHYDRAMINE HCL 50 MG/ML IJ SOLN
INTRAMUSCULAR | Status: AC
  Administered 2015-03-24: 25 mg via INTRAVENOUS
  Filled 2015-03-24: qty 1

## 2015-03-24 MED ORDER — HALOPERIDOL LACTATE 5 MG/ML IJ SOLN
INTRAMUSCULAR | Status: AC
  Administered 2015-03-24: 5 mg via INTRAVENOUS
  Filled 2015-03-24: qty 1

## 2015-03-24 MED ORDER — MAGNESIUM SULFATE 2 GM/50ML IV SOLN
2.0000 g | Freq: Once | INTRAVENOUS | Status: AC
  Administered 2015-03-25: 2 g via INTRAVENOUS

## 2015-03-24 MED ORDER — LORAZEPAM 2 MG/ML IJ SOLN
INTRAMUSCULAR | Status: AC
  Administered 2015-03-24: 1 mg via INTRAVENOUS
  Filled 2015-03-24: qty 1

## 2015-03-24 MED ORDER — LORAZEPAM 2 MG/ML IJ SOLN: 0.0000 mg | Freq: Four times a day (QID) | INTRAMUSCULAR | Status: DC

## 2015-03-24 MED ORDER — KCL-LACTATED RINGERS 20 MEQ/L IV SOLN
INTRAVENOUS | Status: AC
  Administered 2015-03-25: via INTRAVENOUS
  Filled 2015-03-24: qty 1000

## 2015-03-24 MED ORDER — LORAZEPAM 2 MG PO TABS: 0.0000 mg | ORAL_TABLET | Freq: Two times a day (BID) | ORAL | Status: DC

## 2015-03-24 MED ORDER — LORAZEPAM 2 MG/ML IJ SOLN: 0.0000 mg | Freq: Two times a day (BID) | INTRAMUSCULAR | Status: DC

## 2015-03-24 NOTE — ED Notes (Signed)
BEHAVIORAL HEALTH ROUNDING Patient sleeping: No    Patient alert and oriented: No  Behavior appropriate: No.  ; If no, describe: Pt is anxious. MD made aware and received new orders.  Nutrition and fluids offered: Yes Toileting and hygiene offered: Yes Sitter present: Yes Law enforcement present: Yes

## 2015-03-24 NOTE — ED Provider Notes (Signed)
[facility] Emergency Department Provider Note  ____________________________________________  Time seen: Approximately 10:25 PM  I have reviewed the triage vital signs and the nursing notes.   HISTORY  Chief Complaint Altered Mental Status and Addiction Problem  Limited by intoxication/altered mental status  HPI Edward Singleton is a 64 y.o. male with a history of alcohol abuse who presents with altered mental status and clear intoxication.He was reportedly found on the floor of his home.  He has a large bruise that does not appear acute on the upper left part of his chest as well as some bruising to his forehead.  He is awake and speaking semi-coherently.  He is not in any distress in spite of his altered mental status. he is not complaining of anything at this time.  His level of intoxication/altered mental status is severe   Past Medical History  Diagnosis Date  . Hypertension   . Hypercholesteremia   . Gout   . Pelvic fracture 11/17/2014    Patient Active Problem List   Diagnosis Date Noted  . Insomnia 02/10/2015  . Memory loss   . Bacterial UTI 12/01/2014  . Acute blood loss anemia 11/25/2014  . Pelvic fracture 11/25/2014  . Protein-calorie malnutrition, severe 11/19/2014  . Closed fracture of left distal humerus 11/18/2014  . Multiple stable closed lateral compression fractures of pelvis, Left LC1 pelvic ring fx  11/18/2014  . Fall with multiple fractures 11/17/2014  . HTN (hypertension) 11/17/2014  . Acute on PRESUMED chronic kidney failure 11/17/2014  . Benzodiazepine abuse 11/17/2014  . Alcohol abuse 11/17/2014  . Dyslipidemia 11/17/2014  . History of nonadherence to medical treatment 11/17/2014  . Macrocytic anemia 11/17/2014    Past Surgical History  Procedure Laterality Date  . Nephrectomy    . Orif humerus fracture Left 11/18/2014    Procedure: OPEN REDUCTION INTERNAL FIXATION (ORIF) DISTAL HUMERUS FRACTURE;  Surgeon: Rozanna Box, MD;  Location: [facility];  Service: Orthopedics;  Laterality: Left;    Current Outpatient Rx  Name  Route  Sig  Dispense  Refill  . allopurinol (ZYLOPRIM) 300 MG tablet   Oral   Take 150 mg by mouth daily.          Marland Kitchen docusate sodium (COLACE) 100 MG capsule   Oral   Take 100 mg by mouth 2 (two) times daily.         . metoprolol tartrate (LOPRESSOR) 25 MG tablet   Oral   Take 1 tablet (25 mg total) by mouth 2 (two) times daily.   60 tablet   4   . Multiple Vitamin (MULTIVITAMIN) tablet   Oral   Take 1 tablet by mouth daily.         . polyethylene glycol (MIRALAX / GLYCOLAX) packet   Oral   Take 17 g by mouth daily.         . simvastatin (ZOCOR) 40 MG tablet   Oral   Take 40 mg by mouth daily.         . traZODone (DESYREL) 50 MG tablet   Oral   Take 1 tablet (50 mg total) by mouth at bedtime.   30 tablet   2     Allergies Review of patient's allergies indicates no known allergies.  Family History  Problem Relation Age of Onset  .       Social History History  Substance Use Topics  . Smoking status: Current Every Day Smoker  . Smokeless tobacco: Not on  file  . Alcohol Use: Yes    Review of Systems Unable to obtain due to intoxication/EMS  ____________________________________________   PHYSICAL EXAM:  VITAL SIGNS: ED Triage Vitals  Enc Vitals Group     BP 03/24/15 2040 143/83 mmHg     Pulse Rate 03/24/15 2040 111     Resp 03/24/15 2040 20     Temp 03/24/15 2040 98.5 F (36.9 C)     Temp Source 03/24/15 2040 Oral     SpO2 03/24/15 2040 97 %     Weight 03/24/15 2018 140 lb (63.504 kg)     Height 03/24/15 2018 5\' 8"  (1.727 m)     Head Cir --      Peak Flow --      Pain Score --      Pain Loc --      Pain Edu? --      Excl. in [place]? --     Constitutional: Alert but somnolent and appears to be heavily intoxicated.  Disheveled. Eyes: Conjunctivae are normal. PERRL. EOMI. Head: Contusions to left side of 400 Nose: No  congestion/rhinnorhea. Mouth/Throat: Mucous membranes are moist.  Oropharynx non-erythematous. Neck: No stridor.  No cervical spine tenderness to palpation. Cardiovascular: Normal rate, regular rhythm. Grossly normal heart sounds.  Good peripheral circulation. Respiratory: Normal respiratory effort.  No retractions. Lungs CTAB.  Tenderness to palpation of left anterior chest over the large subcutaneous bruise Gastrointestinal: Soft and nontender. No distention. No abdominal bruits. No CVA tenderness. Musculoskeletal: No lower extremity tenderness nor edema.  No joint effusions.  Patient is moving all 4 extremities without any pain or tenderness Neurologic:  Slurred, random, and rambling speech.  Moving all 4 extremities.  Horizontal nystagmus Skin:  Skin is warm, dry and intact.  Scattered bruising on his extremities and a large subcutaneous bruise on his left anterior chest wall Psychiatric: Oriented to self, random and mostly incoherent speech   ____________________________________________   LABS (all labs ordered are listed, but only abnormal results are displayed)  Labs Reviewed  COMPREHENSIVE METABOLIC PANEL - Abnormal; Notable for the following:    Potassium 3.1 (*)    Chloride 91 (*)    Glucose, Bld 143 (*)    Creatinine, Ser 1.31 (*)    Calcium 7.9 (*)    AST 53 (*)    Alkaline Phosphatase 228 (*)    GFR calc non Af Amer 57 (*)    Anion gap 16 (*)    All other components within normal limits  CBC WITH DIFFERENTIAL/PLATELET - Abnormal; Notable for the following:    WBC 11.1 (*)    RBC 3.82 (*)    HCT 37.7 (*)    MCH 34.6 (*)    RDW 17.1 (*)    Neutro Abs 7.3 (*)    All other components within normal limits  ACETAMINOPHEN LEVEL - Abnormal; Notable for the following:    Acetaminophen (Tylenol), Serum <10 (*)    All other components within normal limits  ETHANOL - Abnormal; Notable for the following:    Alcohol, Ethyl (B) 428 (*)    All other components within normal  limits  URINALYSIS COMPLETEWITH MICROSCOPIC ([facility] ONLY) - Abnormal; Notable for the following:    Color, Urine STRAW (*)    APPearance CLEAR (*)    Specific Gravity, Urine 1.004 (*)    Hgb urine dipstick 1+ (*)    Bacteria, UA RARE (*)    All other components within normal limits  PROTIME-INR  SALICYLATE LEVEL  TROPONIN I  CK  URINE DRUG SCREEN, QUALITATIVE ([facility] ONLY)  MAGNESIUM  ETHANOL  COMPREHENSIVE METABOLIC PANEL  MAGNESIUM  TRIGLYCERIDES   ____________________________________________  EKG  ED ECG REPORT I, Edison Wollschlager, the attending physician, personally viewed and interpreted this ECG.  Date: 03/25/2015 EKG Time: 20:48 Rate: 106 Rhythm: Sinus tachycardia QRS Axis: normal Intervals: normal ST/T Wave abnormalities: normal Conduction Disutrbances: none Narrative Interpretation: unremarkable  ____________________________________________  RADIOLOGY  Ct Head Wo Contrast  03/24/2015   CLINICAL DATA:  Acute onset of altered mental status. Found on floor. Concern for head or cervical spine injury. Initial encounter.  EXAM: CT HEAD WITHOUT CONTRAST  CT CERVICAL SPINE WITHOUT CONTRAST  TECHNIQUE: Multidetector CT imaging of the head and cervical spine was performed following the standard protocol without intravenous contrast. Multiplanar CT image reconstructions of the cervical spine were also generated.  COMPARISON:  CT of the head and cervical spine performed 11/17/2014  FINDINGS: CT HEAD FINDINGS  There is no evidence of acute infarction, mass lesion, or intra- or extra-axial hemorrhage on CT.  Prominence of the ventricles and sulci reflects mild cortical volume loss. Cerebellar atrophy is noted. Scattered periventricular white matter change likely reflects small vessel ischemic microangiopathy.  The brainstem and fourth ventricle are within normal limits. The basal ganglia are unremarkable in appearance. The cerebral hemispheres demonstrate grossly normal gray-white  differentiation. No mass effect or midline shift is seen.  There is no evidence of fracture; visualized osseous structures are unremarkable in appearance. The orbits are within normal limits, aside from mild bilateral proptosis. The paranasal sinuses and mastoid air cells are well-aerated. No significant soft tissue abnormalities are seen.  CT CERVICAL SPINE FINDINGS  There is no evidence of fracture or subluxation. Vertebral bodies demonstrate normal height and alignment. Mild disc space narrowing is noted along the lower cervical spine, with scattered anterior and posterior disc osteophyte complexes. Mild facet disease is noted at the mid cervical spine. Prevertebral soft tissues are within normal limits.  The visualized portions of the thyroid gland are unremarkable in appearance. Minimal scarring is noted at the lung apices. Calcification is noted at the carotid bifurcations bilaterally.  IMPRESSION: 1. No evidence of traumatic intracranial injury or fracture. 2. No evidence of fracture or subluxation along the cervical spine. 3. Mild cortical volume loss and scattered small vessel ischemic microangiopathy. 4. Minimal degenerative change along the lower cervical spine. 5. Mild bilateral proptosis noted. 6. Minimal scarring at the lung apices. 7. Calcification at the carotid bifurcations bilaterally. Carotid ultrasound could be considered for further evaluation, when and as deemed clinically appropriate.   Electronically Signed   By: Garald Balding M.D.   On: 03/24/2015 21:36   Ct Cervical Spine Wo Contrast  03/24/2015   CLINICAL DATA:  Acute onset of altered mental status. Found on floor. Concern for head or cervical spine injury. Initial encounter.  EXAM: CT HEAD WITHOUT CONTRAST  CT CERVICAL SPINE WITHOUT CONTRAST  TECHNIQUE: Multidetector CT imaging of the head and cervical spine was performed following the standard protocol without intravenous contrast. Multiplanar CT image reconstructions of the cervical  spine were also generated.  COMPARISON:  CT of the head and cervical spine performed 11/17/2014  FINDINGS: CT HEAD FINDINGS  There is no evidence of acute infarction, mass lesion, or intra- or extra-axial hemorrhage on CT.  Prominence of the ventricles and sulci reflects mild cortical volume loss. Cerebellar atrophy is noted. Scattered periventricular white matter change likely reflects small vessel ischemic microangiopathy.  The brainstem and  fourth ventricle are within normal limits. The basal ganglia are unremarkable in appearance. The cerebral hemispheres demonstrate grossly normal gray-white differentiation. No mass effect or midline shift is seen.  There is no evidence of fracture; visualized osseous structures are unremarkable in appearance. The orbits are within normal limits, aside from mild bilateral proptosis. The paranasal sinuses and mastoid air cells are well-aerated. No significant soft tissue abnormalities are seen.  CT CERVICAL SPINE FINDINGS  There is no evidence of fracture or subluxation. Vertebral bodies demonstrate normal height and alignment. Mild disc space narrowing is noted along the lower cervical spine, with scattered anterior and posterior disc osteophyte complexes. Mild facet disease is noted at the mid cervical spine. Prevertebral soft tissues are within normal limits.  The visualized portions of the thyroid gland are unremarkable in appearance. Minimal scarring is noted at the lung apices. Calcification is noted at the carotid bifurcations bilaterally.  IMPRESSION: 1. No evidence of traumatic intracranial injury or fracture. 2. No evidence of fracture or subluxation along the cervical spine. 3. Mild cortical volume loss and scattered small vessel ischemic microangiopathy. 4. Minimal degenerative change along the lower cervical spine. 5. Mild bilateral proptosis noted. 6. Minimal scarring at the lung apices. 7. Calcification at the carotid bifurcations bilaterally. Carotid ultrasound  could be considered for further evaluation, when and as deemed clinically appropriate.   Electronically Signed   By: Garald Balding M.D.   On: 03/24/2015 21:36    ____________________________________________   PROCEDURES  Procedure(s) performed: None  Critical Care performed: No ____________________________________________   INITIAL IMPRESSION / ASSESSMENT AND PLAN / ED COURSE  Pertinent labs & imaging results that were available during my care of the patient were reviewed by me and considered in my medical decision making (see chart for details).  Obtained stat head CT and C-spine CT to rule out a cranial or cervical spine injury, but they were nonacute and generally unremarkable.  I provided a banana bag given his alcoholism and put him on the CIWA protocol.  I am also giving magnesium 2 g IV for a slight deficit.  Of note, his blood is very lipemic and his sodium was unavailable as a result.  I am giving him another liter of crystalloid after the completion of the banana bag and have ordered follow-up labs in the morning to reassess his chemistry panel.  He is in no acute distress at this time but needs to be monitored carefully given his high level of intoxication.  Given his agitation and despite of his high ethanol level, and also given Haldol 5 mg IV and Benadryl 25 mg IV  ____________________________________________  FINAL CLINICAL IMPRESSION(S) / ED DIAGNOSES  Final diagnoses:  Alcohol intoxication, with unspecified complication  Altered mental status, unspecified altered mental status type  Hyperlipemia      NEW MEDICATIONS STARTED DURING THIS VISIT:  New Prescriptions   No medications on file     Hinda Kehr, MD 03/25/15 0009

## 2015-03-24 NOTE — ED Notes (Addendum)
Pt presents to ED via EMS from personal home with c/o of altered mental status and ETOH drug abuse. EMS states pt lives alone and was found on the floor. EMS states pt has notable left chest large bruise, with unknown origin of time and impact source. EMS states has been continuously altered since route to ER, with notable odor of ETOH. Pt arrived to ER speaking in random sentences, with random thoughts.

## 2015-03-25 ENCOUNTER — Telehealth: Payer: Self-pay | Admitting: *Deleted

## 2015-03-25 LAB — COMPREHENSIVE METABOLIC PANEL
ALK PHOS: 185 U/L — AB (ref 38–126)
ALT: 16 U/L — ABNORMAL LOW (ref 17–63)
AST: 50 U/L — AB (ref 15–41)
Albumin: 3.3 g/dL — ABNORMAL LOW (ref 3.5–5.0)
Anion gap: 11 (ref 5–15)
BILIRUBIN TOTAL: 0.7 mg/dL (ref 0.3–1.2)
BUN: 14 mg/dL (ref 6–20)
CHLORIDE: 100 mmol/L — AB (ref 101–111)
CO2: 27 mmol/L (ref 22–32)
Calcium: 7.9 mg/dL — ABNORMAL LOW (ref 8.9–10.3)
Creatinine, Ser: 1.06 mg/dL (ref 0.61–1.24)
GLUCOSE: 178 mg/dL — AB (ref 65–99)
Potassium: 3.1 mmol/L — ABNORMAL LOW (ref 3.5–5.1)
SODIUM: 138 mmol/L (ref 135–145)
Total Protein: 5.9 g/dL — ABNORMAL LOW (ref 6.5–8.1)

## 2015-03-25 LAB — MAGNESIUM: Magnesium: 1.6 mg/dL — ABNORMAL LOW (ref 1.7–2.4)

## 2015-03-25 LAB — ETHANOL: Alcohol, Ethyl (B): 62 mg/dL — ABNORMAL HIGH (ref <5)

## 2015-03-25 LAB — TRIGLYCERIDES: Triglycerides: UNDETERMINED mg/dL (ref <150)

## 2015-03-25 MED ORDER — LORAZEPAM 2 MG PO TABS
ORAL_TABLET | ORAL | Status: AC
  Administered 2015-03-25: 2 mg via ORAL
  Filled 2015-03-25: qty 1

## 2015-03-25 MED ORDER — VITAMIN B-1 100 MG PO TABS
ORAL_TABLET | ORAL | Status: AC
  Filled 2015-03-25: qty 1

## 2015-03-25 MED ORDER — MAGNESIUM SULFATE 2 GM/50ML IV SOLN
INTRAVENOUS | Status: AC
  Administered 2015-03-25: 2 g via INTRAVENOUS
  Filled 2015-03-25: qty 50

## 2015-03-25 NOTE — ED Notes (Signed)
Spoke at length with patient's family, three sisters. They are a good support system for patient and are very concerned about his health due to the amount of drinking he does in addition to the number of years he has been drinking. States he drinks [employer] and they found 2 half gallons under the sink in addition to a half of a half gallon. They gave a password in order to do followup regarding his status via telephone ([place]). Family reassured that patient is receiving excellent care and is safe at this time. They are grateful to staff for all that has been done for him.

## 2015-03-25 NOTE — ED Notes (Signed)
BEHAVIORAL HEALTH ROUNDING Patient sleeping: Yes.   Patient alert and oriented: not applicable Behavior appropriate: Yes.  ; If no, describe:  Nutrition and fluids offered: No Toileting and hygiene offered: No Sitter present: not applicable Law enforcement present: Yes  

## 2015-03-25 NOTE — ED Provider Notes (Signed)
-----------------------------------------   4:26 PM on 03/25/2015 -----------------------------------------  Patient calm, cooperative, sobe. denies any SI or HI. States he no longer wishes to be urine is asking to go home. We will discharge the patient home at this time.   Harvest Dark, MD 03/25/15 [phone]

## 2015-03-25 NOTE — ED Notes (Signed)
BEHAVIORAL HEALTH ROUNDING Patient sleeping: yes Patient alert and oriented: yes Behavior appropriate: Yes.  ;  Nutrition and fluids offered: Yes  Toileting and hygiene offered: Yes  Sitter present: no Law enforcement present: Yes

## 2015-03-25 NOTE — ED Notes (Signed)
Pt observed lying in bed, sleeping soundly. Pt visualized with NAD. Continue to monitor.

## 2015-03-25 NOTE — ED Notes (Signed)
BEHAVIORAL HEALTH ROUNDING Patient sleeping: No. Patient alert and oriented: yes Behavior appropriate: Yes.  ;  Nutrition and fluids offered: Yes  Toileting and hygiene offered: Yes  Sitter present: no Law enforcement present: Yes   

## 2015-03-25 NOTE — Clinical Social Work Note (Signed)
Clinical Social Work Assessment  Patient Details  Name: Edward Singleton MRN: [id number] Date of Birth: 13-Aug-1951  Date of referral:  03/25/15               Reason for consult:  [employer], Substance Use/ETOH Abuse                Permission sought to share information with:  Case Freight forwarder, Customer service manager, Teacher, music, Other (Verbal permission to speak to sister Vickie) Permission granted to share information::  Yes, Hospital doctor (Verbal consent given to nurse and social work)  Name::     Vickie -Sister  Agency::     Relationship::     Contact Information:     Housing/Transportation Living arrangements for the past 2 months:  Single Family Home Source of Information:  Patient Patient Interpreter Needed:  None Criminal Activity/Legal Involvement Pertinent to Current Situation/Hospitalization:  No - Comment as needed Significant Relationships:  None Lives with:  Self Do you feel safe going back to the place where you live?  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 LCSW recommends in patient BMU. If patient will consider outpatient [facility] SAIOP/discharge back home. Patient was unwell during assessment.  Employment status:  [employer] information:  Other (Comment Required) PT Recommendations:  No Follow Up Information / Referral to community resources:  Outpatient Substance Abuse Treatment Options, Support Groups, Other (Comment Required) (TBD)  Patient/Family's Response to care: Unknown  Patient/Family's Understanding of and Emotional Response to Diagnosis, Current Treatment, and Prognosis:  Unknown  Emotional Assessment Appearance:  Appears stated age,  Disheveled Attitude/Demeanor/Rapport:  Apprehensive, Avoidant, Lethargic Affect (typically observed):  Accepting, Anxious, Apprehensive, Calm, Quiet, Flat Orientation:  Oriented to Self, Oriented to Place, Oriented to  Time, Oriented to Situation, Fluctuating Orientation (Suspected and/or reported Sundowners) Alcohol / Substance use:  Tobacco Use Psych involvement (Current and /or in the community):  No (Comment)  Discharge Needs  Concerns to be addressed:  Compliance Issues Concerns, Lack of Support, Substance Abuse Concerns Readmission within the last 30 days:  No Current discharge risk:  Substance Abuse Barriers to Discharge:  Active Substance Use   Joana Reamer, LCSW 03/25/2015, 2:55 PM

## 2015-03-25 NOTE — ED Notes (Signed)
BEHAVIORAL HEALTH ROUNDING Patient sleeping: No   Patient alert and oriented: Yes Behavior appropriate: Yes.  ; If no, describe:  Nutrition and fluids offered: Yes  Toileting and hygiene offered: Yes Sitter present: yes Law enforcement present: Yes  

## 2015-03-25 NOTE — ED Notes (Signed)
BEHAVIORAL HEALTH ROUNDING Patient sleeping: Yes.   Patient alert and oriented: Sleeping  Behavior appropriate: Yes.  ; If no, describe:  Nutrition and fluids offered: Sleeping  Toileting and hygiene offered: Sleeping  Sitter present: yes Law enforcement present: Yes

## 2015-03-25 NOTE — ED Notes (Signed)
Pt given breakfast tray at this time. 

## 2015-03-25 NOTE — ED Notes (Signed)

## 2015-03-25 NOTE — ED Notes (Addendum)
Pt's sister called to check on pt's status.  RN requested that pt speak first with sister.  After their conversation, RN obtained pt's permission to speak with sister Olegario Shearer).  Vicky particularly wanted to know about alcohol level.  On admission, alcohol was 428 and currently it is 62.

## 2015-03-25 NOTE — Discharge Instructions (Signed)
Alcohol Intoxication °Alcohol intoxication occurs when you drink enough alcohol that it affects your ability to function. It can be mild or very severe. Drinking a lot of alcohol in a short time is called binge drinking. This can be very harmful. Drinking alcohol can also be more dangerous if you are taking medicines or other drugs. Some of the effects caused by alcohol may include: °· Loss of coordination. °· Changes in mood and behavior. °· Unclear thinking. °· Trouble talking (slurred speech). °· Throwing up (vomiting). °· Confusion. °· Slowed breathing. °· Twitching and shaking (seizures). °· Loss of consciousness. °HOME CARE °· Do not drive after drinking alcohol. °· Drink enough water and fluids to keep your pee (urine) clear or pale yellow. Avoid caffeine. °· Only take medicine as told by your doctor. °GET HELP IF: °· You throw up (vomit) many times. °· You do not feel better after a few days. °· You frequently have alcohol intoxication. Your doctor can help decide if you should see a substance use treatment counselor. °GET HELP RIGHT AWAY IF: °· You become shaky when you stop drinking. °· You have twitching and shaking. °· You throw up blood. It may look bright red or like coffee grounds. °· You notice blood in your poop (bowel movements). °· You become lightheaded or pass out (faint). °MAKE SURE YOU:  °· Understand these instructions. °· Will watch your condition. °· Will get help right away if you are not doing well or get worse. °Document Released: 03/14/2008 Document Revised: 05/29/2013 Document Reviewed: 03/01/2013 °Exit[employer]® Patient Information ©2015 [employer]. This information is not intended to replace advice given to you by your health care provider. Make sure you discuss any questions you have with your health care provider. ° °

## 2015-03-25 NOTE — Progress Notes (Signed)
LCSW completed patient assessment. Patient is deciding if he will go to long term or out patient treatment Consulted with Dr Weber Cooks, awaiting assessment /treatment options

## 2015-03-25 NOTE — ED Notes (Signed)
BEHAVIORAL HEALTH ROUNDING Patient sleeping: No. Patient alert and oriented: yes Behavior appropriate: Yes.  ; If no, describe:  Nutrition and fluids offered: Yes  Toileting and hygiene offered: Yes  Sitter present: not applicable Law enforcement present: Yes  

## 2015-03-25 NOTE — ED Notes (Signed)
Labs redrawn and sent to lab.

## 2015-03-25 NOTE — ED Notes (Signed)
BEHAVIORAL HEALTH ROUNDING  Patient sleeping: Yes.  Patient alert and oriented: Sleeping  Behavior appropriate: Yes. ; If no, describe:  Nutrition and fluids offered: Sleeping  Toileting and hygiene offered: Sleeping  Sitter present: yes  Law enforcement present: Yes

## 2015-03-25 NOTE — ED Provider Notes (Signed)
-----------------------------------------   7:25 AM on 03/25/2015 -----------------------------------------  Assuming care from Dr. Dahlia Client.  In short, Edward Singleton is a 64 y.o. male with a chief complaint of Altered Mental Status and Addiction Problem .  Refer to the original H&P for additional details.  The current plan of care is to repeat labs and reassess for disposition, reassess for any suicidality once he is clinically sober.  ----------------------------------------- 1:06 PM on 03/25/2015 -----------------------------------------  Repeat labs reviewed and are no longer lipemic - notable for mild hypokalemia but otherwise generally unremarkable.  Joanne Gavel, MD 03/25/15 1308

## 2015-03-25 NOTE — Telephone Encounter (Signed)
Pt's sister says Edward Singleton was admitted to [facility] overnight for high levels of etoH. He had a fall.  He was monitored overnight, given electrolytes.  Pt scheduled to see a psychiatrist today. She was calling to mostly inform but also asked about your thoughts were about moving forward and asked if you would join in the conversation of what should be done to ultimately get help for her brother.

## 2015-03-25 NOTE — Clinical Social Work Note (Signed)
Clinical Social Work Assessment  Patient Details  Name: Edward Singleton MRN: [id number] Date of Birth: 1951/02/04  Date of referral:  03/25/15               Reason for consult:  [employer], Substance Use/ETOH Abuse                Permission sought to share information with:  Case Freight forwarder, Customer service manager, Teacher, music, Other (Verbal permission to speak to sister Vickie) Permission granted to share information::  Yes, Hospital doctor (Verbal consent given to nurse and social work)  Name::     Vickie -Sister  Agency::     Relationship::     Contact Information:     Housing/Transportation Living arrangements for the past 2 months:  Single Family Home Source of Information:  Patient Patient Interpreter Needed:  None Criminal Activity/Legal Involvement Pertinent to Current Situation/Hospitalization:  No - Comment as needed Significant Relationships:  None Lives with:  Self Do you feel safe going back to the place where you live?  Yes Need for family participation in patient care:  No (Coment)  Care giving concerns:  none   Facilities manager / plan: Patient to decide if he wants in patient or out patient treatment.  Employment status:  [employer] information:  Other (Comment Required) PT Recommendations:  No Follow Up Information / Referral to community resources:  Outpatient Substance Abuse Treatment Options, Support Groups, Other (Comment Required) (TBD)  Patient/Family's Response to care:  Unknown   Patient/Family's Understanding of and Emotional Response to Diagnosis, Current Treatment, and Prognosis:  He needs help he admitted this much  Emotional Assessment Appearance:  Appears stated age, Disheveled Attitude/Demeanor/Rapport:  Apprehensive, Avoidant, Lethargic Affect (typically observed):  Accepting, Anxious, Apprehensive, Calm, Quiet, Flat Orientation:  Oriented to Self, Oriented to Place, Oriented to  Time, Oriented to  Situation, Fluctuating Orientation (Suspected and/or reported Sundowners) Alcohol / Substance use:  Tobacco Use Psych involvement (Current and /or in the community):  No (Comment)  Discharge Needs  Concerns to be addressed:  Compliance Issues Concerns, Lack of Support, Substance Abuse Concerns Readmission within the last 30 days:  No Current discharge risk:  Substance Abuse Barriers to Discharge:  Active Substance Use   Joana Reamer, LCSW 03/25/2015, 3:01 PM

## 2015-03-25 NOTE — Telephone Encounter (Signed)
Mr. Duecker needs an voluntary admission to an inpatient etoh, detox program.  [place]?

## 2015-03-26 NOTE — Telephone Encounter (Signed)
Called pt's sister back and relayed Dr. Charm Barges message. She says pt is defiant to the idea of going to treatment.

## 2015-03-30 ENCOUNTER — Telehealth: Payer: Self-pay | Admitting: *Deleted

## 2015-03-30 NOTE — Telephone Encounter (Signed)
Pt's sister called back. They finally got the pt to agree to an inpatient alcohol treatment program.  On Friday MaryBeth sent over a packet of information to [facility].  They contacted the family and was told that he is not a candidate for their program due to white matter disease. Obviously they are frustrated and are concerned about their options.  They are going to try [place]. They are also concerned about pt's cognitive levels.  They feel there has been a dramatic decrease since pt's last drinking binge. Pt's sister reports that he is not doing any personal hygiene and is concerned that pt is no longer going to PT and OT.

## 2015-04-08 ENCOUNTER — Encounter: Payer: Self-pay | Admitting: Physical Medicine & Rehabilitation

## 2015-04-08 ENCOUNTER — Encounter: Payer: 59 | Attending: Physical Medicine & Rehabilitation | Admitting: Physical Medicine & Rehabilitation

## 2015-04-08 VITALS — BP 152/98 | HR 99 | Resp 14

## 2015-04-08 DIAGNOSIS — F1310 Sedative, hypnotic or anxiolytic abuse, uncomplicated: Secondary | ICD-10-CM | POA: Insufficient documentation

## 2015-04-08 DIAGNOSIS — S42402S Unspecified fracture of lower end of left humerus, sequela: Secondary | ICD-10-CM | POA: Insufficient documentation

## 2015-04-08 DIAGNOSIS — F411 Generalized anxiety disorder: Secondary | ICD-10-CM | POA: Diagnosis not present

## 2015-04-08 DIAGNOSIS — F1027 Alcohol dependence with alcohol-induced persisting dementia: Secondary | ICD-10-CM | POA: Insufficient documentation

## 2015-04-08 DIAGNOSIS — R269 Unspecified abnormalities of gait and mobility: Secondary | ICD-10-CM | POA: Diagnosis not present

## 2015-04-08 DIAGNOSIS — F1010 Alcohol abuse, uncomplicated: Secondary | ICD-10-CM | POA: Diagnosis not present

## 2015-04-08 DIAGNOSIS — S329XXS Fracture of unspecified parts of lumbosacral spine and pelvis, sequela: Secondary | ICD-10-CM | POA: Diagnosis not present

## 2015-04-08 NOTE — Patient Instructions (Signed)
PLEASE CALL ME WITH ANY PROBLEMS OR QUESTIONS (#[phone]).  HAVE A GOOD DAY!    

## 2015-04-08 NOTE — Progress Notes (Signed)
Subjective:    Patient ID: Edward Singleton, male    DOB: July 14, 1951, 64 y.o.   MRN: [id number]  HPI  Edward Singleton is here in follow up of his polytrauma and multiple issues. He tested positive for ETOH at his last visit here. He was subsequently found unresponsive at home with a BAL of 468 and was taken to the ED. Daughter who is here with him states he has been driving to get his own etoh. The family attempted to get him into [facility], but his dementia was such that he did not qualify for the program.   The patient was found this morning by his daughter, bleeding from a fall.   I asked the patient about some of the events of the last month and he doesn't remember half of what took place.    Pain Inventory Average Pain 4 Pain Right Now 4 My pain is constant  In the last 24 hours, has pain interfered with the following? General activity 4 Relation with others 4 Enjoyment of life 4 What TIME of day is your pain at its worst? all Sleep (in general) Fair  Pain is worse with: walking, bending, sitting and inactivity Pain improves with: medication Relief from Meds: 6  Mobility walk without assistance walk with assistance use a cane how many minutes can you walk? 30 ability to climb steps?  yes do you drive?  yes  Function disabled: date disabled . I need assistance with the following:  shopping  Neuro/Psych weakness trouble walking  Prior Studies Any changes since last visit?  no  Physicians involved in your care Any changes since last visit?  no   Family History  Problem Relation Age of Onset  .      History   Social History  . Marital Status: Single    Spouse Name: N/A  . Number of Children: N/A  . Years of Education: N/A   Social History Main Topics  . Smoking status: Current Every Day Smoker  . Smokeless tobacco: Not on file  . Alcohol Use: Yes  . Drug Use: Not on file  . Sexual Activity: Not on file   Other Topics Concern  . None    Social History Narrative   Past Surgical History  Procedure Laterality Date  . Nephrectomy    . Orif humerus fracture Left 11/18/2014    Procedure: OPEN REDUCTION INTERNAL FIXATION (ORIF) DISTAL HUMERUS FRACTURE;  Surgeon: Rozanna Box, MD;  Location: [facility];  Service: Orthopedics;  Laterality: Left;   Past Medical History  Diagnosis Date  . Hypertension   . Hypercholesteremia   . Gout   . Pelvic fracture 11/17/2014   BP 152/98 mmHg  Pulse 99  Resp 14  SpO2 98%  Opioid Risk Score:   Fall Risk Score: Moderate Fall Risk (6-13 points)`1  Depression screen PHQ 2/9  No flowsheet data found.   Review of Systems  Genitourinary: Positive for dysuria.  Musculoskeletal: Positive for gait problem.  Neurological: Positive for weakness.  Hematological: Bruises/bleeds easily.  All other systems reviewed and are negative.      Objective:   Physical Exam   Head: Normocephalic.  Eyes: EOM are normal.  Neck: Normal range of motion.  Cardiovascular: Normal rate and regular rhythm.  Respiratory: Effort normal and breath sounds normal. No respiratory distress.  GI: Soft. Bowel sounds are normal. He exhibits no distension.  Musculoskeletal:  Abrasion on forehead as well as left arm-== large lac at left elbow--minimal  bleeding--substantial old blood Neurological: He is flat, nearly incoherent today. Remembers me.  Patient is oriented to person     Psychiatric:  No evidence of lability or agitation. Calm and collected   Assessment/Plan:  1. Functional deficits secondary to multitrauma after a fall with left distal humerus fracture, pelvic fx's status post ORIF humerus fx. 2. ETOH abuse---not a candidate for inpatient program due to his dementia---unable to carryover any information from day to day. .  3. Pain Management: tylenol prn   4.   SVD/dementia: which is worsening from ETOH. I have requested a HH eval for PT (balance and safety) as well as SW to assess dispo and rehab  options.   -he needs 24 hour supervision  -no driving, keys should be taken 5. Insomnia: trazodone for sleep.  6. HTN:  BP issues per primary.    Follow up with me 3 months. Thirty minutes of face to face patient care time were spent during this visit. All questions were encouraged and answered.

## 2015-05-11 ENCOUNTER — Encounter: Payer: Self-pay | Admitting: *Deleted

## 2015-05-11 ENCOUNTER — Ambulatory Visit (INDEPENDENT_AMBULATORY_CARE_PROVIDER_SITE_OTHER): Payer: 59 | Admitting: Urology

## 2015-05-11 ENCOUNTER — Ambulatory Visit
Admission: RE | Admit: 2015-05-11 | Discharge: 2015-05-11 | Disposition: A | Discharge status: Home / Self Care | Payer: 59 | Source: Ambulatory Visit | Attending: Urology | Admitting: Urology

## 2015-05-11 VITALS — BP 186/120 | HR 102 | Ht 67.0 in | Wt 126.4 lb

## 2015-05-11 DIAGNOSIS — I2510 Atherosclerotic heart disease of native coronary artery without angina pectoris: Secondary | ICD-10-CM | POA: Diagnosis not present

## 2015-05-11 DIAGNOSIS — F558 Abuse of other non-psychoactive substances: Secondary | ICD-10-CM | POA: Diagnosis not present

## 2015-05-11 DIAGNOSIS — F1010 Alcohol abuse, uncomplicated: Secondary | ICD-10-CM

## 2015-05-11 DIAGNOSIS — R109 Unspecified abdominal pain: Secondary | ICD-10-CM

## 2015-05-11 DIAGNOSIS — N200 Calculus of kidney: Secondary | ICD-10-CM | POA: Insufficient documentation

## 2015-05-11 DIAGNOSIS — I2584 Coronary atherosclerosis due to calcified coronary lesion: Secondary | ICD-10-CM | POA: Diagnosis not present

## 2015-05-11 DIAGNOSIS — K5730 Diverticulosis of large intestine without perforation or abscess without bleeding: Secondary | ICD-10-CM | POA: Insufficient documentation

## 2015-05-11 DIAGNOSIS — IMO0002 Reserved for concepts with insufficient information to code with codable children: Secondary | ICD-10-CM

## 2015-05-11 LAB — URINALYSIS, COMPLETE
Bilirubin, UA: NEGATIVE
Glucose, UA: NEGATIVE
KETONES UA: NEGATIVE
Nitrite, UA: NEGATIVE
PH UA: 5.5 (ref 5.0–7.5)
Protein, UA: NEGATIVE
RBC, UA: NEGATIVE
Specific Gravity, UA: 1.01 (ref 1.005–1.030)
Urobilinogen, Ur: 0.2 mg/dL (ref 0.2–1.0)

## 2015-05-11 LAB — MICROSCOPIC EXAMINATION
Bacteria, UA: NONE SEEN
RBC MICROSCOPIC, UA: NONE SEEN /HPF (ref 0–?)

## 2015-05-11 NOTE — Progress Notes (Signed)
05/11/2015 11:10 AM   [name] [birth year] [id number]  Referring provider: Ezequiel Kayser, MD [place], [place] [id number]  Chief Complaint  Patient presents with  . Nephrolithiasis    pt thinks he has a kidney stone    HPI: [name] is a 64 year old white male with a history of nephrolithiasis who presents today complaining of left-sided flank pain.  Patient stated that 3 days ago he had the sudden onset of left-sided flank pain. The pain lasted for hours at a time. It was on and off during the weekend.  He described as a sharp stabbing pain. Moving around seem to help the pain. He did not have any gross hematuria. He denied any fevers, chills or vomiting. He did have some slight nausea.  He also has a history of left renal artery stenosis. He has been evaluated by vascular and a renal stent was placed. Unfortunately it did not improve his renal function.   The left kidney is now atrophic.  The patient also has a history of abuse of pain medication.   Patient was last seen by [place] 1 year ago and at that visit his sister expressed her concerns to [place] about the patient abusing pain medication. We offered a referral to a pain clinic, but patient refused.   More recently, he has been abusing alcohol and suffered a fall. He reported that he was not taking prescription narcotics, but his urine tested positive for these medications.  PMH: Past Medical History  Diagnosis Date  . Hypertension   . Hypercholesteremia   . Gout   . Pelvic fracture 11/17/2014  . Benign enlargement of prostate   . Hypogonadism in male   . Kidney stones   . Atrophic kidney   . Left flank pain, chronic   . 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 Nephrectomy    . Orif humerus fracture Left 11/18/2014    Procedure: OPEN REDUCTION INTERNAL FIXATION (ORIF) DISTAL HUMERUS FRACTURE;  Surgeon: Rozanna Box, MD;  Location: [facility];   Service: Orthopedics;  Laterality: Left;  . Lithotripsy    . Left renal stent placement  2013  . Splenectomy      Home Medications:    Medication List       This list is accurate as of: 05/11/15 11:10 AM.  Always use your most recent med list.               allopurinol 300 MG tablet  Commonly known as:  ZYLOPRIM  Take 150 mg by mouth daily.     docusate sodium 100 MG capsule  Commonly known as:  COLACE  Take 100 mg by mouth 2 (two) times daily.     metoprolol tartrate 25 MG tablet  Commonly known as:  LOPRESSOR  Take 1 tablet (25 mg total) by mouth 2 (two) times daily.     multivitamin tablet  Take 1 tablet by mouth daily.     polyethylene glycol packet  Commonly known as:  MIRALAX / GLYCOLAX  Take 17 g by mouth daily.     simvastatin 40 MG tablet  Commonly known as:  ZOCOR  Take 40 mg by mouth daily.     traZODone 50 MG tablet  Commonly known as:  DESYREL  Take 1 tablet (50 mg total) by mouth at bedtime.        Allergies: No  Known Allergies  Family History: Family History  Problem Relation Age of Onset  . Nephrolithiasis Paternal Grandfather   . Kidney disease Neg Hx   . Prostate cancer Neg Hx     Social History:  reports that he has been smoking.  He does not have any smokeless tobacco history on file. He reports that he drinks alcohol. He reports that he does not use illicit drugs.  ROS: UROLOGY Frequent Urination?: No Hard to postpone urination?: No Burning/pain with urination?: Yes Get up at night to urinate?: Yes Leakage of urine?: No Urine stream starts and stops?: No Trouble starting stream?: No Do you have to strain to urinate?: No Blood in urine?: Yes Urinary tract infection?: No Sexually transmitted disease?: No Injury to kidneys or bladder?: No Painful intercourse?: No Weak stream?: No Erection problems?: No Penile pain?: No  Gastrointestinal Nausea?: Yes Vomiting?: No Indigestion/heartburn?: No Diarrhea?: No Constipation?:  No  Constitutional Fever: No Night sweats?: No Weight loss?: No Fatigue?: No  Skin Skin rash/lesions?: No Itching?: Yes  Eyes Blurred vision?: No Double vision?: No  Ears/Nose/Throat Sore throat?: No Sinus problems?: No  Hematologic/Lymphatic Swollen glands?: No Easy bruising?: Yes  Cardiovascular Leg swelling?: No Chest pain?: No  Respiratory Cough?: No Shortness of breath?: No  Endocrine Excessive thirst?: No  Musculoskeletal Back pain?: No Joint pain?: No  Neurological Headaches?: No Dizziness?: No  Psychologic Depression?: No Anxiety?: No  Physical Exam: BP 186/120 mmHg  Pulse 102  Ht 5\' 7"  (1.702 m)  Wt 126 lb 6.4 oz (57.335 kg)  BMI 19.79 kg/m2  GU: Patient with  circumcised phallus.  Urethral meatus is patent.  No penile discharge. No penile lesions or rashes. Scrotum without lesions, cysts, rashes and/or edema.  Testicles are located scrotally bilaterally. No masses are appreciated in the testicles. Left and right epididymis are normal. Rectal: Patient with  normal sphincter tone. Perineum without scarring or rashes. No rectal masses are appreciated. Prostate is approximately 40 grams, no nodules are appreciated. Seminal vesicles are normal.   Laboratory Data: Results for orders placed or performed in visit on 05/11/15  Microscopic Examination  Result Value Ref Range   WBC, UA 6-10 (A) 0 -  5 /hpf   RBC, UA None seen 0 -  2 /hpf   Epithelial Cells (non renal) 0-10 0 - 10 /hpf   Casts Present (A) None seen /lpf   Cast Type Hyaline casts N/A   Bacteria, UA None seen None seen/Few  Urinalysis, Complete  Result Value Ref Range   Specific Gravity, UA 1.010 1.005 - 1.030   pH, UA 5.5 5.0 - 7.5   Color, UA Yellow Yellow   Appearance Ur Clear Clear   Leukocytes, UA Trace (A) Negative   Protein, UA Negative Negative/Trace   Glucose, UA Negative Negative   Ketones, UA Negative Negative   RBC, UA Negative Negative   Bilirubin, UA Negative  Negative   Urobilinogen, Ur 0.2 0.2 - 1.0 mg/dL   Nitrite, UA Negative Negative   Microscopic Examination See below:    Lab Results  Component Value Date   WBC 11.1* 03/24/2015   HGB 13.2 03/24/2015   HCT 37.7* 03/24/2015   MCV 98.7 03/24/2015   PLT 211 03/24/2015    Lab Results  Component Value Date   CREATININE 1.06 03/25/2015    No results found for: PSA  No results found for: TESTOSTERONE  No results found for: HGBA1C  Urinalysis    Component Value Date/Time   COLORURINE STRAW*  03/24/2015 2241   COLORURINE Yellow 01/15/2014 1320   APPEARANCEUR CLEAR* 03/24/2015 2241   APPEARANCEUR Clear 01/15/2014 1320   LABSPEC 1.004* 03/24/2015 2241   LABSPEC 1.014 01/15/2014 1320   PHURINE 6.0 03/24/2015 2241   PHURINE 5.0 01/15/2014 1320   GLUCOSEU NEGATIVE 03/24/2015 2241   GLUCOSEU Negative 01/15/2014 1320   HGBUR 1+* 03/24/2015 2241   HGBUR Negative 01/15/2014 1320   BILIRUBINUR NEGATIVE 03/24/2015 2241   BILIRUBINUR Negative 01/15/2014 [place] 03/24/2015 2241   KETONESUR Negative 01/15/2014 1320   PROTEINUR NEGATIVE 03/24/2015 2241   PROTEINUR Negative 01/15/2014 1320   UROBILINOGEN 1.0 11/29/2014 1534   NITRITE NEGATIVE 03/24/2015 2241   NITRITE Negative 01/15/2014 1320   LEUKOCYTESUR NEGATIVE 03/24/2015 2241   LEUKOCYTESUR Negative 01/15/2014 1320    Pertinent Imaging: CLINICAL DATA: 64 year old male with history of left-sided flank pain for the past 3 days. History of kidney stones status post lithotripsy 8 times in the past.  EXAM: CT ABDOMEN AND PELVIS WITHOUT CONTRAST  TECHNIQUE: Multidetector CT imaging of the abdomen and pelvis was performed following the standard protocol without IV contrast.  COMPARISON: CT of abdomen and pelvis 11/17/2014.  FINDINGS: Lower chest: Atherosclerotic calcifications in the distal right coronary artery.  Hepatobiliary: No definite cystic or solid hepatic lesions are identified on today's  noncontrast CT examination. The unenhanced appearance of the gallbladder is unremarkable.  Pancreas: No definite pancreatic mass or peripancreatic inflammatory changes identified on today's noncontrast CT.  Spleen: Status post splenectomy. Small splenules in the left upper quadrant.  Adrenals/Urinary Tract: Several tiny nonobstructive calculi are present within the collecting systems of the kidneys bilaterally (left greater than right), largest of which measures 5 mm in the lower pole of the left kidney. No additional calculi are identified along the course of either ureter, or within the lumen of the urinary bladder. No hydroureteronephrosis to indicate urinary tract obstruction at this time. Severe left renal atrophy similar to the prior examination. Exophytic 3.2 x 2.7 cm low-attenuation lesion in the interpolar region of the left kidney is incompletely characterized on today's noncontrast CT examination, but similar to the prior study, presumably a cyst. Bilateral adrenal glands are normal in appearance.  Stomach/Bowel: The unenhanced appearance of the stomach is normal. No pathologic dilatation of small bowel or colon. A few scattered colonic diverticulae are noted, without surrounding inflammatory changes to suggest an acute diverticulitis at this time. Normal appendix.  Vascular/Lymphatic: Extensive atherosclerosis throughout the abdominal and pelvic vasculature, including fusiform ectasia of the infrarenal abdominal aorta which measures up to 2.8 x 2.7 cm. Left renal artery stents. No lymphadenopathy noted in the abdomen or pelvis on today's noncontrast CT examination.  Reproductive: Prostate gland and seminal vesicles are unremarkable in appearance.  Other: No significant volume of ascites. No pneumoperitoneum.  Musculoskeletal: Healing fractures of the left superior and inferior pubic rami are noted. Healed fracture of the left sacral ala. Multiple healing  left-sided rib fractures. There are no aggressive appearing lytic or blastic lesions noted in the visualized portions of the skeleton.  IMPRESSION: 1. Multiple nonobstructive calculi within the collecting systems of the kidneys bilaterally, largest of which measures 5 mm in the the lower pole collecting system of the left kidney. No ureteral stones or findings of urinary tract obstruction are noted at this time. 2. Severe left renal atrophy. 3. Extensive atherosclerosis, including right coronary artery disease. Please note that although the presence of coronary artery calcium documents the presence of coronary artery disease, the severity  of this disease and any potential stenosis cannot be assessed on this non-gated CT examination. Assessment for potential risk factor modification, dietary therapy or pharmacologic therapy may be warranted, if clinically indicated. 4. Mild colonic diverticulosis without evidence of acute diverticulitis at this time. 5. Additional incidental findings, as above.   Electronically Signed  By: Vinnie Langton M.D.  On: 05/11/2015 13:15       Assessment & Plan:   1.  Left flank pain:    Patient had a sudden onset of left-sided flank pain and has a history of nephrolithiasis. We'll obtain a non contrast CT at this time for further evaluation.  While the CT scan did identify kidney stones, they are unchanged from the prior CT in February and are not causing obstruction at this time. The stones are not the source of his pain. What may be more concerning is the atherosclerosis, including coronary artery disease. This is of unknown significance at this time, but he needs to have his PCP look at this and decide if a cardiac work is needed at this time.  - Urinalysis, Complete  2.  Bilateral nephrolithiasis:   Patient's CT scan today noted bilateral non obstructing renal stones.  These stones are unchanged from the CT in February 2016.  His pain is  not from these stones.  3. Prescription drug abuse:   Patient was found to have a positive urine test for prescription drugs after telling a provider he had not taken these types of medications.  4. Alcohol abuse:   Patient is currently undergoing treatment for his alcohol abuse.   5. CAD:   Message sent for patient to follow up with his PCP concerning the findings on CT.    No Follow-up on file.  Zara Council, [facility] [address] [place], [place] [address] [phone]

## 2015-05-12 ENCOUNTER — Telehealth: Payer: Self-pay

## 2015-05-12 NOTE — Telephone Encounter (Signed)
-----   Message from Nori Riis, PA-C sent at 05/11/2015  1:54 PM EDT ----- While the CT scan did identify kidney stones, they are unchanged from the prior CT in February and are not causing obstruction at this time.  The stones are not the source of his pain.  What may be more concerning is the atherosclerosis, including coronary artery disease.  This is of unknown significance at this time, but he needs to have his PCP look at this and decide if a cardiac work is needed at this time.

## 2015-05-12 NOTE — Telephone Encounter (Signed)
LMOM

## 2015-05-12 NOTE — Telephone Encounter (Signed)
Pt returned call. CT results were given. Pt voiced understanding. Pt also requested these records be faxed to Dr. Genene Churn, PCP. Nurse made pt aware that would be done but to f/u with PCP to make sure records were received. Pt voiced understanding.

## 2015-05-13 ENCOUNTER — Telehealth: Payer: Self-pay | Admitting: *Deleted

## 2015-05-13 NOTE — Telephone Encounter (Signed)
Would you call Dr. Dorthula Perfect office to notify them of the CT scan results noting CAD and see if they would like to see him?  And then notify the patient through My Chart.  His sister will be monitoring the emails.

## 2015-05-13 NOTE — Telephone Encounter (Signed)
-----   Message from Nori Riis, PA-C sent at 05/11/2015  1:54 PM EDT ----- While the CT scan did identify kidney stones, they are unchanged from the prior CT in February and are not causing obstruction at this time.  The stones are not the source of his pain.  What may be more concerning is the atherosclerosis, including coronary artery disease.  This is of unknown significance at this time, but he needs to have his PCP look at this and decide if a cardiac work is needed at this time.

## 2015-05-13 NOTE — Telephone Encounter (Signed)
Voice message left requesting a call-back. 

## 2015-05-13 NOTE — Telephone Encounter (Signed)
Pt (and his sister) has been notified via MY Chart that his PCP has been made aware of his recent CT results. . . sm

## 2015-05-15 ENCOUNTER — Telehealth: Payer: Self-pay | Admitting: Urology

## 2015-05-15 NOTE — Telephone Encounter (Deleted)
I spoke with

## 2015-05-15 NOTE — Telephone Encounter (Signed)
Left VM on sister's Chauncy Passy) mobile # [phone]) requesting a call-back so that I may relay Dr Raechel Ache office response.

## 2015-05-15 NOTE — Telephone Encounter (Addendum)
Edward Singleton at Dr Dorthula Perfect office returned my call;  Dr Dorthula Perfect is aware of the pt's CT results however, "Edward Singleton has missed his last 10 appointments w/ Dr Dorthula Perfect. . ."  and his office will not contact the pt to scheduling any further appointments. . . sm

## 2015-05-15 NOTE — Telephone Encounter (Signed)
Please call the patient's sister or use the patient's portal to notify the sister of the repsonse from Dr. Raechel Ache office.   Expand All Collapse All   Bridget at Dr Dorthula Perfect office returned my call; Dr Dorthula Perfect is aware of the pt's CT results however, "Mr Russello has missed his last 10 appointments w/ Dr Dorthula Perfect. . ." and his office will not contact the pt to scheduling any further appointments. . . sm

## 2015-05-15 NOTE — Telephone Encounter (Signed)
Spoke with [name], pt sister/medical POA, in reference to Dr. Dorthula Perfect' office response. [name] voiced understanding stating she would handle the situation.

## 2015-05-25 DIAGNOSIS — I10 Essential (primary) hypertension: Secondary | ICD-10-CM

## 2015-05-25 DIAGNOSIS — Z9181 History of falling: Secondary | ICD-10-CM

## 2015-05-25 DIAGNOSIS — F1010 Alcohol abuse, uncomplicated: Secondary | ICD-10-CM | POA: Diagnosis not present

## 2015-05-25 DIAGNOSIS — S329XXD Fracture of unspecified parts of lumbosacral spine and pelvis, subsequent encounter for fracture with routine healing: Secondary | ICD-10-CM | POA: Diagnosis not present

## 2015-05-25 DIAGNOSIS — S42402D Unspecified fracture of lower end of left humerus, subsequent encounter for fracture with routine healing: Secondary | ICD-10-CM | POA: Diagnosis not present

## 2015-05-25 DIAGNOSIS — F0390 Unspecified dementia without behavioral disturbance: Secondary | ICD-10-CM

## 2015-05-25 DIAGNOSIS — G309 Alzheimer's disease, unspecified: Secondary | ICD-10-CM | POA: Diagnosis not present

## 2015-05-26 ENCOUNTER — Ambulatory Visit: Payer: 59 | Attending: Physical Medicine & Rehabilitation

## 2015-05-26 DIAGNOSIS — R29898 Other symptoms and signs involving the musculoskeletal system: Secondary | ICD-10-CM | POA: Diagnosis present

## 2015-05-26 DIAGNOSIS — S329XXD Fracture of unspecified parts of lumbosacral spine and pelvis, subsequent encounter for fracture with routine healing: Secondary | ICD-10-CM | POA: Insufficient documentation

## 2015-05-26 DIAGNOSIS — X58XXXD Exposure to other specified factors, subsequent encounter: Secondary | ICD-10-CM | POA: Diagnosis not present

## 2015-05-26 DIAGNOSIS — S42402D Unspecified fracture of lower end of left humerus, subsequent encounter for fracture with routine healing: Secondary | ICD-10-CM | POA: Diagnosis not present

## 2015-05-26 NOTE — Therapy (Signed)
[place], [place], [address] Phone: [phone]   Fax:  [phone]  Physical Therapy Evaluation  Patient Details  Name: Edward Singleton MRN: [id number] Date of Birth: September 10, 1951 Referring Provider:  Meredith Staggers, MD  Encounter Date: 05/26/2015      PT End of Session - 05/26/15 1000    Visit Number 1   Number of Visits 1   Date for PT Re-Evaluation 05/26/15   PT Start Time 0806   PT Stop Time 1120   PT Time Calculation (min) 194 min   Activity Tolerance Patient tolerated treatment well   Behavior During Therapy [facility] for tasks assessed/performed      Past Medical History  Diagnosis Date  . Hypertension   . Hypercholesteremia   . Gout   . Pelvic fracture 11/17/2014  . Benign enlargement of prostate   . Hypogonadism in male   . Kidney stones   . Atrophic kidney   . Left flank pain, chronic   . 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 Nephrectomy    . Orif humerus fracture Left 11/18/2014    Procedure: OPEN REDUCTION INTERNAL FIXATION (ORIF) DISTAL HUMERUS FRACTURE;  Surgeon: Rozanna Box, MD;  Location: [place];  Service: Orthopedics;  Laterality: Left;  . Lithotripsy    . Left renal stent placement  2013  . Splenectomy      There were no vitals filed for this visit.  Visit Diagnosis:  Weakness of both legs - Plan: PT plan of care cert/re-cert  Weakness of shoulder - Plan: PT plan of care cert/re-cert  Pelvic fracture, with routine healing, subsequent encounter - Plan: PT plan of care cert/re-cert      Subjective Assessment - 05/26/15 0948    Subjective Not sure why I'm here.   Currently in Pain? No/denies            [facility] PT Assessment - 05/26/15 0949    Assessment   Medical Diagnosis pelvic fracture Lt distal humeral fracture   Onset Date/Surgical Date 11/17/14   Precautions   Precautions None   Restrictions   Weight  Bearing Restrictions No   Balance Screen   Has the patient fallen in the past 6 months No   Cognition   Overall Cognitive Status Impaired/Different from baseline   Area of Impairment --  dementia per MD note                                PT Long Term Goals - 02/11/15 1626    PT LONG TERM GOAL #1   Title Patient will be independent with his home exercise program to improve LE strength, decrease pain, and improve ability to ambulate.    Time 6   Period Weeks   Status New   PT LONG TERM GOAL #2   Title Patient will improve L LE strength by 1/2 MMT grade to promote ability to ambulate, negotiate stairs.   Time 6   Period Weeks   Status New   PT LONG TERM GOAL #3   Title Patient will be able to ambulate at least 500 ft without AD and without LOB to promote mobility.    Time 6   Period Weeks   Status New   PT LONG TERM GOAL #4   Title Patient will improve his LEFS score  by at least 9 points as a demonstration of improved function.    Time 6   Period Weeks   Status New               Plan - 05/26/15 1132    Clinical Impression Statement Edward Singleton finished the FCE process without pain except some mild neck discomfort. He was cooperative for the testing. He was rated  for work at a light level. His cognitive  issues may impact employment.    PT Next Visit Plan None. He will be discharged to Dr Naaman Plummer for assessment. The FCE report will be faxed to Dr Joie Bimler and Agree with Plan of Care Patient;Family member/caregiver         Problem List Patient Active Problem List   Diagnosis Date Noted  . Kidney stones 05/11/2015  . Flank pain 05/11/2015  . Prescription drug abuse 05/11/2015  . Coronary artery disease due to calcified coronary lesion 05/11/2015  . 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 Insomnia 02/10/2015  . Memory loss   . Bacterial UTI 12/01/2014  . Acute blood loss anemia 11/25/2014  . Pelvic fracture 11/25/2014  .  Protein-calorie malnutrition, severe 11/19/2014  . Closed fracture of left distal humerus 11/18/2014  . Multiple stable closed lateral compression fractures of pelvis, Left LC1 pelvic ring fx  11/18/2014  . Fall with multiple fractures 11/17/2014  . HTN (hypertension) 11/17/2014  . Acute on PRESUMED chronic kidney failure 11/17/2014  . Benzodiazepine abuse 11/17/2014  . Alcohol abuse 11/17/2014  . Dyslipidemia 11/17/2014  . History of nonadherence to medical treatment 11/17/2014  . Macrocytic anemia 11/17/2014    Darrel Hoover PT 05/26/2015, 11:39 AM  [facility] [address] [place], [place], [address] Phone: [phone]   Fax:  [phone]

## 2015-05-27 NOTE — Telephone Encounter (Signed)
Please let the patient's daughter know that patient had "No Showed" on several  Occasions and they cannot evaluate him for the CAD.  Is there someone else she would like [place] to refer her brother to?

## 2015-07-07 ENCOUNTER — Encounter: Payer: 59 | Attending: Physical Medicine & Rehabilitation | Admitting: Physical Medicine & Rehabilitation

## 2015-07-07 ENCOUNTER — Encounter: Payer: Self-pay | Admitting: Physical Medicine & Rehabilitation

## 2015-07-07 VITALS — BP 115/69 | HR 64 | Resp 14

## 2015-07-07 DIAGNOSIS — F1027 Alcohol dependence with alcohol-induced persisting dementia: Secondary | ICD-10-CM | POA: Diagnosis not present

## 2015-07-07 DIAGNOSIS — S42402S Unspecified fracture of lower end of left humerus, sequela: Secondary | ICD-10-CM | POA: Diagnosis not present

## 2015-07-07 DIAGNOSIS — E780 Pure hypercholesterolemia: Secondary | ICD-10-CM | POA: Insufficient documentation

## 2015-07-07 DIAGNOSIS — N261 Atrophy of kidney (terminal): Secondary | ICD-10-CM | POA: Diagnosis not present

## 2015-07-07 DIAGNOSIS — F17210 Nicotine dependence, cigarettes, uncomplicated: Secondary | ICD-10-CM | POA: Diagnosis not present

## 2015-07-07 DIAGNOSIS — Z87442 Personal history of urinary calculi: Secondary | ICD-10-CM | POA: Diagnosis not present

## 2015-07-07 DIAGNOSIS — I10 Essential (primary) hypertension: Secondary | ICD-10-CM | POA: Insufficient documentation

## 2015-07-07 DIAGNOSIS — F1010 Alcohol abuse, uncomplicated: Secondary | ICD-10-CM | POA: Diagnosis not present

## 2015-07-07 DIAGNOSIS — F0390 Unspecified dementia without behavioral disturbance: Secondary | ICD-10-CM | POA: Insufficient documentation

## 2015-07-07 DIAGNOSIS — M109 Gout, unspecified: Secondary | ICD-10-CM | POA: Diagnosis not present

## 2015-07-07 DIAGNOSIS — N400 Enlarged prostate without lower urinary tract symptoms: Secondary | ICD-10-CM | POA: Insufficient documentation

## 2015-07-07 DIAGNOSIS — X58XXXS Exposure to other specified factors, sequela: Secondary | ICD-10-CM | POA: Insufficient documentation

## 2015-07-07 DIAGNOSIS — G4700 Insomnia, unspecified: Secondary | ICD-10-CM | POA: Insufficient documentation

## 2015-07-07 MED ORDER — METOPROLOL TARTRATE 25 MG PO TABS: 25.0000 mg | ORAL_TABLET | Freq: Two times a day (BID) | ORAL | Status: DC

## 2015-07-07 MED ORDER — TRAZODONE HCL 50 MG PO TABS: 50.0000 mg | ORAL_TABLET | Freq: Every day | ORAL | Status: DC

## 2015-07-07 MED ORDER — SIMVASTATIN 40 MG PO TABS: 40.0000 mg | ORAL_TABLET | Freq: Every day | ORAL | Status: DC

## 2015-07-07 MED ORDER — ONE-DAILY MULTI VITAMINS PO TABS: 1.0000 | ORAL_TABLET | Freq: Every day | ORAL | Status: DC

## 2015-07-07 MED ORDER — ALLOPURINOL 300 MG PO TABS: 150.0000 mg | ORAL_TABLET | Freq: Every day | ORAL | Status: DC

## 2015-07-07 NOTE — Progress Notes (Signed)
Subjective:    Patient ID: Edward Singleton, male    DOB: [birth year], 64 y.o.   MRN: [id number]  HPI   Edward Singleton is here in follow up of his polytrauma and etoh abuse/dementia.  His sisters have been helping with his meds. He is taking them more consistently. He is still drinking unfortunately. His sister knows that he's receiving drinks from somewhere. He won't disclose where he is getting the ETOH from. "fortunately" he's only drinking beer at this point  They have reached out to DSS/APS who won't provide assistance. They are telling him that he's not "at risk" enough to meet their criteria.      Pain Inventory Average Pain 0 Pain Right Now 0 My pain is no pain  In the last 24 hours, has pain interfered with the following? General activity 0 Relation with others 0 Enjoyment of life 0 What TIME of day is your pain at its worst? no pain Sleep (in general) Good  Pain is worse with: no pain Pain improves with: no pain Relief from Meds: no pain  Mobility walk without assistance ability to climb steps?  yes do you drive?  no  Function employed # of hrs/week 0 / long term disability I need assistance with the following:  shopping  Neuro/Psych bladder control problems confusion  Prior Studies Any changes since last visit?  no  Physicians involved in your care Any changes since last visit?  no   Family History  Problem Relation Age of Onset  . Nephrolithiasis Paternal Grandfather   . Kidney disease Neg Hx   . Prostate cancer Neg Hx    Social History   Social History  . Marital Status: Single    Spouse Name: N/A  . Number of Children: N/A  . Years of Education: N/A   Social History Main Topics  . Smoking status: Current Every Day Smoker -- .5 years  . Smokeless tobacco: None  . Alcohol Use: 0.0 oz/week    0 Standard drinks or equivalent per week  . Drug Use: No  . Sexual Activity: Not Asked   Other Topics Concern  . None   Social History Narrative    Past Surgical History  Procedure Laterality Date  . Nephrectomy    . Orif humerus fracture Left 11/18/2014    Procedure: OPEN REDUCTION INTERNAL FIXATION (ORIF) DISTAL HUMERUS FRACTURE;  Surgeon: Rozanna Box, MD;  Location: [place];  Service: Orthopedics;  Laterality: Left;  . Lithotripsy    . Left renal stent placement  2013  . Splenectomy     Past Medical History  Diagnosis Date  . Hypertension   . Hypercholesteremia   . Gout   . Pelvic fracture 11/17/2014  . Benign enlargement of prostate   . Hypogonadism in male   . Kidney stones   . Atrophic kidney   . Left flank pain, chronic   . 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   Gastrointestinal: Positive for nausea, abdominal pain and diarrhea.  Genitourinary: Positive for dysuria.  Hematological: Bruises/bleeds easily.  Psychiatric/Behavioral: Positive for confusion.  All other systems reviewed and are negative.      Objective:   Physical Exam  Head: Normocephalic.  Eyes: EOM are normal.  Neck: Normal range of motion.  Cardiovascular: Normal rate and regular rhythm.  Respiratory: Effort normal and breath sounds normal. No respiratory distress.  GI: Soft. Bowel sounds are normal. He exhibits no distension.  Musculoskeletal:  Abrasion on forehead as well as left arm- large lac at left elbow--minimal bleeding--substantial old blood Neurological: he is more alert and appropriate  today.  Patient is oriented to me, reason he's here. Has little insight and awareness. When queried about etoh use, he does not offer any insight except for the fact that he's taking a cab to get beer.Marland Kitchen    Psychiatric:  Calm and collected. Non anxious, flat at times. Doesn't initiate much  Assessment/Plan:  1. Functional deficits secondary to multitrauma after a fall with left distal humerus fracture, pelvic fx's status post ORIF humerus fx. ---cleared from an ortho standpoint 2. ETOH abuse--this an ongoing issue as well as the associated dementia/SVD .  3. Pain Management: tylenol prn  4. SVD/dementia  -he needs 24 hour supervision  -no driving, keys  taken  -will speak with our inpatient social worker for any recs for support for Edward Singleton at home. At this point I am out of ideas 5. Insomnia: trazodone for sleep. refilled  6. HTN/CAD: refilled metoprolol today.   -needs cardiology referral  -he will first need a PCP first---?[facility]---sister will look into Follow up with me 3 months. Thirty minutes of face to face patient care time were spent during this visit. All questions were encouraged and answered.

## 2015-07-07 NOTE — Patient Instructions (Signed)
I WILL DW WITH [facility]

## 2015-07-15 ENCOUNTER — Ambulatory Visit (INDEPENDENT_AMBULATORY_CARE_PROVIDER_SITE_OTHER): Payer: 59 | Admitting: Family Medicine

## 2015-07-15 ENCOUNTER — Encounter: Payer: Self-pay | Admitting: Family Medicine

## 2015-07-15 ENCOUNTER — Ambulatory Visit: Payer: Self-pay | Admitting: Family Medicine

## 2015-07-15 VITALS — BP 110/80 | HR 83 | Temp 98.5°F | Ht 66.25 in | Wt 124.1 lb

## 2015-07-15 DIAGNOSIS — Z862 Personal history of diseases of the blood and blood-forming organs and certain disorders involving the immune mechanism: Secondary | ICD-10-CM | POA: Diagnosis not present

## 2015-07-15 DIAGNOSIS — I2584 Coronary atherosclerosis due to calcified coronary lesion: Secondary | ICD-10-CM

## 2015-07-15 DIAGNOSIS — E785 Hyperlipidemia, unspecified: Secondary | ICD-10-CM

## 2015-07-15 DIAGNOSIS — Z87898 Personal history of other specified conditions: Secondary | ICD-10-CM | POA: Diagnosis not present

## 2015-07-15 DIAGNOSIS — F1027 Alcohol dependence with alcohol-induced persisting dementia: Secondary | ICD-10-CM

## 2015-07-15 DIAGNOSIS — I10 Essential (primary) hypertension: Secondary | ICD-10-CM

## 2015-07-15 DIAGNOSIS — I2510 Atherosclerotic heart disease of native coronary artery without angina pectoris: Secondary | ICD-10-CM

## 2015-07-15 DIAGNOSIS — F1010 Alcohol abuse, uncomplicated: Secondary | ICD-10-CM | POA: Diagnosis not present

## 2015-07-15 DIAGNOSIS — N400 Enlarged prostate without lower urinary tract symptoms: Secondary | ICD-10-CM | POA: Insufficient documentation

## 2015-07-15 DIAGNOSIS — D649 Anemia, unspecified: Secondary | ICD-10-CM | POA: Insufficient documentation

## 2015-07-15 DIAGNOSIS — F1097 Alcohol use, unspecified with alcohol-induced persisting dementia: Secondary | ICD-10-CM

## 2015-07-15 DIAGNOSIS — M109 Gout, unspecified: Secondary | ICD-10-CM | POA: Insufficient documentation

## 2015-07-15 DIAGNOSIS — F1911 Other psychoactive substance abuse, in remission: Secondary | ICD-10-CM

## 2015-07-15 NOTE — Assessment & Plan Note (Signed)
Will need cardiology referral for evaluation and possible stress test. Will hold for now while sorting out social issues.

## 2015-07-15 NOTE — Assessment & Plan Note (Signed)
CT findings in overview. MMSE 21 today indicated mild dementia.

## 2015-07-15 NOTE — Assessment & Plan Note (Signed)
Well controlled. Continue Metoprolol.

## 2015-07-15 NOTE — Assessment & Plan Note (Signed)
Will continue Zocor for now.

## 2015-07-15 NOTE — Progress Notes (Signed)
Subjective:  Patient ID: Edward Singleton, male    DOB: 06/25/1951  Age: 64 y.o. MRN: [id number]  CC: Establish care  HPI Edward Singleton is a 64 y.o. male presents to the clinic today to establish care.  He is accompanied by his sister who is his healthcare and financial power of attorney.  Current issues are below.  1) Recent Trauma admission and ongoing alcohol abuse  Patient was admitted to [facility] in 11/2014 after suffering a fall at home and suffering several fractures.  During admission, he required treatment with CIWA and precedex for alcohol withdrawal.  He was discharged to inpatient rehab and progressed and was later discharged to SNF.  He was discharged home from SNF in March.  He has continued to follow up with PM&R Dr. Naaman Plummer.  He is not driving and is no longer working.  He continues to drink heavily.  >6 beers daily.  It has been recommended that he have 24 hour care/surveillence. This is not occuring and he lives by himself.  His sister is very concerned. Social work and home health has been involved but there has been no progress regarding resources and alcohol treatment. She is concerned for his safety and is at a loss for what to do to aid him and prevent further harm (i.e. Continued alcohol abuse).  2) HTN  Well controlled on Metoprolol.  3) HLD  Well controlled per prior lipid panel  Doing well on Zocor.  I suspect that control is primarily related to protein calorie malnutrition (poor nutrition in setting of alcohol abuse).  4) Dementia  Secondary to chronic alcohol abuse.  Has been a barrier to alcohol treatment facility admission.  He has significant difficulty remembering things and recall.  PMH, Surgical Hx, Family Hx, Social History reviewed and updated as below. Past Medical History  Diagnosis Date  . Hypertension   . Hypercholesteremia   . Gout   . Pelvic fracture ([place]) 11/17/2014  . Benign enlargement of prostate   . Kidney stones     . Atrophic kidney   . Left flank pain, chronic   . 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 Alcohol abuse    Past Surgical History  Procedure Laterality Date  . Orif humerus fracture Left 11/18/2014    Procedure: OPEN REDUCTION INTERNAL FIXATION (ORIF) DISTAL HUMERUS FRACTURE;  Surgeon: Rozanna Box, MD;  Location: [place];  Service: Orthopedics;  Laterality: Left;  . Lithotripsy    . Left renal stent placement  2013  . Splenectomy      Family History  Problem Relation Age of Onset  . Nephrolithiasis Paternal Grandfather   . Kidney disease Neg Hx   . Prostate cancer Neg Hx   . Heart disease Mother   . Heart disease Father   . Diabetes Father     Social History  Substance Use Topics  . Smoking status: Current Every Day Smoker -- 1.00 packs/day for .5 years    Types: Cigarettes  . Smokeless tobacco: Never Used  . Alcohol Use: 25.2 oz/week    0 Standard drinks or equivalent, 42 Cans of beer per week    Review of Systems  Psychiatric/Behavioral:       Memory issues.  All other systems negative.   Objective:   Today's Vitals: BP 110/80 mmHg  Pulse 83  Temp(Src) 98.5 F (36.9 C) (Oral)  Ht 5' 6.25" (1.683 m)  Wt 124 lb 2 oz (56.303 kg)  BMI 19.88 kg/m2  SpO2 98%  Physical Exam  Constitutional:  Chronically ill appearing male in NAD.  HENT:  Head: Normocephalic and atraumatic.  Mouth/Throat: No oropharyngeal exudate.  Normal TM's bilaterally.    Neck: Neck supple. No thyromegaly present.  Cardiovascular: Normal rate, regular rhythm and intact distal pulses.   Pulmonary/Chest: Effort normal. No respiratory distress. He has no wheezes. He has no rales.  Abdominal: Soft. He exhibits no distension. There is no tenderness.  Could not appreciated Hepatomegaly due to abdominal muscle tension.  Musculoskeletal: He exhibits no edema.  Lymphadenopathy:    He has no cervical adenopathy.  Neurological:  Alert. Oriented to season, year (could not recall month, day).  Slightly  oriented to place (knew he was at [facility] but did not know that we were in [place]). MMSE performed today: 21/30.   Skin: Skin is warm and dry.  Psychiatric: He has a normal mood and affect. His behavior is normal. Thought content normal.  Vitals reviewed.  Assessment & Plan:   Problem List Items Addressed This Visit    Alcohol abuse    Patient continues to drink heavily.  His alcohol consumption and dementia puts him at high risk for self harm.  It was recommended that he have 24 hour care and he is currently living alone.  His sister (POA) is involved but is at a loss for what to do. I feel that he is a danger to himself and I had a long discussion with both of them today. I recommended long term care/SNF placement as this gives him access to care, keeps him safe and will allow for treatment of alcoholism. He and sister are in agreement.  Spoke with social work today who will meet with them to discuss options.       Relevant Orders   Ambulatory referral to [place]   BPH (benign prostatic hyperplasia)   Coronary artery calcification    Will need cardiology referral for evaluation and possible stress test. Will hold for now while sorting out social issues.      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 MMSE 21 today indicated mild dementia.      Dyslipidemia    Will continue Zocor for now.      Gout   History of anemia   History of substance abuse   HTN (hypertension)    Well controlled. Continue Metoprolol.       Other Visit Diagnoses    Coronary artery disease involving native coronary artery of native heart without angina pectoris    -  Primary       Outpatient Encounter Prescriptions as of 07/15/2015  Medication Sig  . allopurinol (ZYLOPRIM) 300 MG tablet Take 0.5 tablets (150 mg total) by mouth daily.  . metoprolol tartrate (LOPRESSOR) 25 MG tablet Take 1 tablet (25 mg total) by mouth 2 (two) times daily.  . Multiple Vitamin (MULTIVITAMIN)  tablet Take 1 tablet by mouth daily.  . polyethylene glycol (MIRALAX / GLYCOLAX) packet Take 17 g by mouth daily.  . simvastatin (ZOCOR) 40 MG tablet Take 1 tablet (40 mg total) by mouth daily.  . traZODone (DESYREL) 50 MG tablet Take 1 tablet (50 mg total) by mouth at bedtime.   No facility-administered encounter medications on file as of 07/15/2015.   Follow-up: Next week  Greater than 45 minutes were spent face-to-face with the patient during this encounter and over half of that time was spent on counseling and coordination of care.  Coral Spikes DO

## 2015-07-15 NOTE — Assessment & Plan Note (Signed)
Patient continues to drink heavily.  His alcohol consumption and dementia puts him at high risk for self harm.  It was recommended that he have 24 hour care and he is currently living alone.  His sister (POA) is involved but is at a loss for what to do. I feel that he is a danger to himself and I had a long discussion with both of them today. I recommended long term care/SNF placement as this gives him access to care, keeps him safe and will allow for treatment of alcoholism. He and sister are in agreement.  Spoke with social work today who will meet with them to discuss options.

## 2015-07-15 NOTE — Progress Notes (Signed)
Pre visit review using our clinic review tool, if applicable. No additional management support is needed unless otherwise documented below in the visit note. 

## 2015-07-22 ENCOUNTER — Encounter: Payer: Self-pay | Admitting: Family Medicine

## 2015-07-22 ENCOUNTER — Ambulatory Visit: Payer: Self-pay | Admitting: Family Medicine

## 2015-07-22 ENCOUNTER — Inpatient Hospital Stay
Admission: EM | Admit: 2015-07-22 | Discharge: 2015-07-27 | DRG: 682 | Disposition: A | Discharge status: Home / Self Care | Payer: 59 | Attending: Internal Medicine | Admitting: Internal Medicine

## 2015-07-22 ENCOUNTER — Telehealth: Payer: Self-pay | Admitting: Family Medicine

## 2015-07-22 ENCOUNTER — Ambulatory Visit (INDEPENDENT_AMBULATORY_CARE_PROVIDER_SITE_OTHER): Payer: 59 | Admitting: Family Medicine

## 2015-07-22 VITALS — BP 102/62 | HR 63 | Temp 97.4°F | Resp 12 | Ht 66.0 in | Wt 114.1 lb

## 2015-07-22 DIAGNOSIS — D72829 Elevated white blood cell count, unspecified: Secondary | ICD-10-CM | POA: Diagnosis present

## 2015-07-22 DIAGNOSIS — N183 Chronic kidney disease, stage 3 (moderate): Secondary | ICD-10-CM | POA: Diagnosis present

## 2015-07-22 DIAGNOSIS — R634 Abnormal weight loss: Secondary | ICD-10-CM

## 2015-07-22 DIAGNOSIS — F17200 Nicotine dependence, unspecified, uncomplicated: Secondary | ICD-10-CM | POA: Diagnosis present

## 2015-07-22 DIAGNOSIS — R17 Unspecified jaundice: Secondary | ICD-10-CM | POA: Diagnosis not present

## 2015-07-22 DIAGNOSIS — E7800 Pure hypercholesterolemia, unspecified: Secondary | ICD-10-CM | POA: Diagnosis present

## 2015-07-22 DIAGNOSIS — F0280 Dementia in other diseases classified elsewhere without behavioral disturbance: Secondary | ICD-10-CM | POA: Diagnosis present

## 2015-07-22 DIAGNOSIS — G4700 Insomnia, unspecified: Secondary | ICD-10-CM | POA: Insufficient documentation

## 2015-07-22 DIAGNOSIS — I129 Hypertensive chronic kidney disease with stage 1 through stage 4 chronic kidney disease, or unspecified chronic kidney disease: Secondary | ICD-10-CM | POA: Diagnosis present

## 2015-07-22 DIAGNOSIS — E785 Hyperlipidemia, unspecified: Secondary | ICD-10-CM

## 2015-07-22 DIAGNOSIS — R197 Diarrhea, unspecified: Secondary | ICD-10-CM | POA: Diagnosis not present

## 2015-07-22 DIAGNOSIS — R7989 Other specified abnormal findings of blood chemistry: Secondary | ICD-10-CM | POA: Diagnosis present

## 2015-07-22 DIAGNOSIS — Z9889 Other specified postprocedural states: Secondary | ICD-10-CM

## 2015-07-22 DIAGNOSIS — M109 Gout, unspecified: Secondary | ICD-10-CM | POA: Diagnosis present

## 2015-07-22 DIAGNOSIS — N179 Acute kidney failure, unspecified: Secondary | ICD-10-CM | POA: Diagnosis not present

## 2015-07-22 DIAGNOSIS — E860 Dehydration: Secondary | ICD-10-CM | POA: Diagnosis present

## 2015-07-22 DIAGNOSIS — F1010 Alcohol abuse, uncomplicated: Secondary | ICD-10-CM | POA: Diagnosis present

## 2015-07-22 DIAGNOSIS — E43 Unspecified severe protein-calorie malnutrition: Secondary | ICD-10-CM | POA: Diagnosis present

## 2015-07-22 DIAGNOSIS — R740 Nonspecific elevation of levels of transaminase and lactic acid dehydrogenase [LDH]: Secondary | ICD-10-CM | POA: Diagnosis present

## 2015-07-22 DIAGNOSIS — Z9081 Acquired absence of spleen: Secondary | ICD-10-CM

## 2015-07-22 DIAGNOSIS — Z833 Family history of diabetes mellitus: Secondary | ICD-10-CM

## 2015-07-22 DIAGNOSIS — F1027 Alcohol dependence with alcohol-induced persisting dementia: Secondary | ICD-10-CM | POA: Diagnosis present

## 2015-07-22 DIAGNOSIS — Z79899 Other long term (current) drug therapy: Secondary | ICD-10-CM

## 2015-07-22 DIAGNOSIS — R748 Abnormal levels of other serum enzymes: Secondary | ICD-10-CM

## 2015-07-22 DIAGNOSIS — E872 Acidosis: Secondary | ICD-10-CM | POA: Diagnosis present

## 2015-07-22 DIAGNOSIS — Z8249 Family history of ischemic heart disease and other diseases of the circulatory system: Secondary | ICD-10-CM

## 2015-07-22 DIAGNOSIS — F0150 Vascular dementia without behavioral disturbance: Secondary | ICD-10-CM | POA: Diagnosis present

## 2015-07-22 DIAGNOSIS — F1097 Alcohol use, unspecified with alcohol-induced persisting dementia: Secondary | ICD-10-CM | POA: Diagnosis present

## 2015-07-22 DIAGNOSIS — Z87442 Personal history of urinary calculi: Secondary | ICD-10-CM

## 2015-07-22 DIAGNOSIS — I2510 Atherosclerotic heart disease of native coronary artery without angina pectoris: Secondary | ICD-10-CM

## 2015-07-22 DIAGNOSIS — N400 Enlarged prostate without lower urinary tract symptoms: Secondary | ICD-10-CM | POA: Diagnosis present

## 2015-07-22 DIAGNOSIS — Z681 Body mass index (BMI) 19 or less, adult: Secondary | ICD-10-CM

## 2015-07-22 DIAGNOSIS — I2584 Coronary atherosclerosis due to calcified coronary lesion: Secondary | ICD-10-CM

## 2015-07-22 DIAGNOSIS — N261 Atrophy of kidney (terminal): Secondary | ICD-10-CM | POA: Diagnosis present

## 2015-07-22 DIAGNOSIS — L989 Disorder of the skin and subcutaneous tissue, unspecified: Secondary | ICD-10-CM

## 2015-07-22 LAB — LIPID PANEL
Cholesterol: 347 mg/dL — ABNORMAL HIGH (ref 0–200)
HDL: 21.2 mg/dL — AB (ref >39.00)
NONHDL: 325.93
Total CHOL/HDL Ratio: 16
Triglycerides: 356 mg/dL — ABNORMAL HIGH (ref 0.0–149.0)
VLDL: 71.2 mg/dL — ABNORMAL HIGH (ref 0.0–40.0)

## 2015-07-22 LAB — COMPREHENSIVE METABOLIC PANEL
ALT: 63 U/L — AB (ref 0–53)
AST: 152 U/L — AB (ref 0–37)
Albumin: 3.4 g/dL — ABNORMAL LOW (ref 3.5–5.2)
Alkaline Phosphatase: 824 U/L — ABNORMAL HIGH (ref 39–117)
BILIRUBIN TOTAL: 5.2 mg/dL — AB (ref 0.2–1.2)
BUN: 25 mg/dL — AB (ref 6–23)
CO2: 16 meq/L — AB (ref 19–32)
CREATININE: 2.69 mg/dL — AB (ref 0.40–1.50)
Calcium: 9.4 mg/dL (ref 8.4–10.5)
Chloride: 99 mEq/L (ref 96–112)
GFR: 25.58 mL/min — AB (ref >60.00)
GLUCOSE: 120 mg/dL — AB (ref 70–99)
Potassium: 3.8 mEq/L (ref 3.5–5.1)
Sodium: 127 mEq/L — ABNORMAL LOW (ref 135–145)
Total Protein: 6.5 g/dL (ref 6.0–8.3)

## 2015-07-22 LAB — CBC
HCT: 30.9 % — ABNORMAL LOW (ref 39.0–52.0)
Hemoglobin: 10 g/dL — ABNORMAL LOW (ref 13.0–17.0)
MCHC: 32.4 g/dL (ref 30.0–36.0)
MCV: 119.3 fl — ABNORMAL HIGH (ref 78.0–100.0)
Platelets: 317 10*3/uL (ref 150.0–400.0)
RBC: 2.59 Mil/uL — ABNORMAL LOW (ref 4.22–5.81)
RDW: 17.8 % — AB (ref 11.5–15.5)
WBC: 14.2 10*3/uL — AB (ref 4.0–10.5)

## 2015-07-22 LAB — LDL CHOLESTEROL, DIRECT: Direct LDL: 230 mg/dL

## 2015-07-22 NOTE — Progress Notes (Signed)
Subjective:  Patient ID: Edward Singleton, male    DOB: 07/21/1951  Age: 64 y.o. MRN: [id number]  CC: Follow up; Diarrhea, skin lesion  HPI:  64 year old male with a past history of alcohol abuse, dyslipidemia, hypertension, and coronary artery calcification presents to the clinic today for follow-up. Since our last visit he has developed frequent diarrhea and has also noticed a suspicious skin lesion.  I spoken with Lyla Son (social work) who is coordinating patient going to SNF for additional rehab and with future hopes of long term care.  There is a facility this agreed to accept him and this is in the works.  Patient reports that he's been having frequent diarrhea since her last visit. He states he goes several times a day. Additionally, he has had poor PO intake (HPOA confirms).  He has lost 10 pounds since her visit a week ago.  Additionally, a skin lesion on his left elbow was recently noticed. He and his healthcare power of attorney are concerned about this.   Regarding his alcohol intake patient reports that he has been abstinent for approximately 24 hours.  Social Hx   Social History   Social History  . Marital Status: Single    Spouse Name: N/A  . Number of Children: N/A  . Years of Education: N/A   Social History Main Topics  . Smoking status: Current Every Day Smoker -- 1.00 packs/day for .5 years    Types: Cigarettes  . Smokeless tobacco: Never Used  . Alcohol Use: 25.2 oz/week    0 Standard drinks or equivalent, 42 Cans of beer per week  . Drug Use: No  . Sexual Activity: Not Asked   Other Topics Concern  . None   Social History Narrative   Review of Systems  Constitutional: Positive for appetite change and unexpected weight change.  Gastrointestinal: Positive for diarrhea.   Objective:  BP 102/62 mmHg  Pulse 63  Temp(Src) 97.4 F (36.3 C) (Oral)  Resp 12  Ht 5\' 6"  (1.676 m)  Wt 114 lb 1.9 oz (51.764 kg)  BMI 18.43 kg/m2  SpO2 100%  BP/Weight  07/22/2015 07/22/2015 07/15/2015  Systolic BP 85 102 110  Diastolic BP 53 62 80  Wt. (Lbs) 125 114.12 124.13  BMI 19.57 18.43 19.88    Physical Exam  Constitutional:  Chronically ill appearing male; appears jaundiced.  Cardiovascular: Normal rate and regular rhythm.   Pulmonary/Chest: Effort normal and breath sounds normal.  Abdominal: Soft. He exhibits no distension. There is no tenderness. There is no rebound and no guarding.  Skin:  1 cm raised lesion noted on the lateral elbow. Distinct borders. No color change.   Psychiatric:  Flat affect.  Vitals reviewed.   Results for orders placed or performed in visit on 07/22/15 (from the past 24 hour(s))  CBC     Status: Abnormal   Collection Time: 07/22/15 12:07 PM  Result Value Ref Range   WBC 14.2 (H) 4.0 - 10.5 K/uL   RBC 2.59 Repeated and verified X2. (L) 4.22 - 5.81 Mil/uL   Platelets 317.0 150.0 - 400.0 K/uL   Hemoglobin 10.0 (L) 13.0 - 17.0 g/dL   HCT 09/21/15 (L) 30.4 - 30.6 %   MCV 119.3 Repeated and verified X2. (H) 78.0 - 100.0 fl   MCHC 32.4 30.0 - 36.0 g/dL   RDW 31.7 (H) 92.1 - 30.3 %  Comp Met (CMET)     Status: Abnormal   Collection Time: 07/22/15 12:07 PM  Result Value Ref Range   Sodium 127 (L) 135 - 145 mEq/L   Potassium 3.8 3.5 - 5.1 mEq/L   Chloride 99 96 - 112 mEq/L   CO2 16 (L) 19 - 32 mEq/L   Glucose, Bld 120 (H) 70 - 99 mg/dL   BUN 25 (H) 6 - 23 mg/dL   Creatinine, Ser 2.69 (H) 0.40 - 1.50 mg/dL   Total Bilirubin 5.2 (H) 0.2 - 1.2 mg/dL   Alkaline Phosphatase 824 (H) 39 - 117 U/L   AST 152 (H) 0 - 37 U/L   ALT 63 (H) 0 - 53 U/L   Total Protein 6.5 6.0 - 8.3 g/dL   Albumin 3.4 (L) 3.5 - 5.2 g/dL   Calcium 9.4 8.4 - 10.5 mg/dL   GFR 25.58 (L) >60.00 mL/min  Lipid panel     Status: Abnormal   Collection Time: 07/22/15 12:07 PM  Result Value Ref Range   Cholesterol 347 (H) 0 - 200 mg/dL   Triglycerides 356.0 (H) 0.0 - 149.0 mg/dL   HDL 21.20 (L) >39.00 mg/dL   VLDL 71.2 (H) 0.0 - 40.0 mg/dL   Total  CHOL/HDL Ratio 16    NonHDL 325.93   LDL cholesterol, direct     Status: None   Collection Time: 07/22/15 12:07 PM  Result Value Ref Range   Direct LDL 230.0 mg/dL     Assessment & Plan:   Problem List Items Addressed This Visit    Coronary artery calcification    Will arrange cardiology referral once patient is out of hospital.      Diarrhea - Primary    C diff ordered.  CMP, CBC obtained today and revealed Anemia, elevated bilirubin, alk phos, and acute renal failure. See separate problems.      Relevant Orders   Clostridium Difficile by PCR   ARF (acute renal failure) ([place])    Labs returned this evening. Patient and HPOA contacted. Patient going to ED and will need admission.      Loss of weight    Likely multifactorial - poor nutrition, alcohol abuse, dementia. Advised supplementation between meals. Patient going to hospital given ARF, anemia, diarrhea. Nees nutrition consult during admission.      Relevant Orders   CBC (Completed)   Comp Met (CMET) (Completed)   Skin lesion    Appears benign but will need to see Derm for possible biopsy. Will arrange once out of hospital.       Other Visit Diagnoses    Hyperlipidemia        Relevant Orders    Lipid panel (Completed)      Follow-up: PRN  Thersa Salt, DO

## 2015-07-22 NOTE — Assessment & Plan Note (Signed)
C diff ordered.  CMP, CBC obtained today and revealed Anemia, elevated bilirubin, alk phos, and acute renal failure. See separate problems.

## 2015-07-22 NOTE — Assessment & Plan Note (Signed)
Will arrange cardiology referral once patient is out of hospital.

## 2015-07-22 NOTE — Assessment & Plan Note (Signed)
Labs returned this evening. Patient and HPOA contacted. Patient going to ED and will need admission.

## 2015-07-22 NOTE — Patient Instructions (Signed)
Stop the simvastatin.  Continue his other medications.  We will call you regarding the referrals.  Encouraged foods and supplements between meals.  Follow up in ~ 1-2 weeks.  Take care  Dr. Lacinda Axon

## 2015-07-22 NOTE — Assessment & Plan Note (Signed)
Likely multifactorial - poor nutrition, alcohol abuse, dementia. Advised supplementation between meals. Patient going to hospital given ARF, anemia, diarrhea. Nees nutrition consult during admission.

## 2015-07-22 NOTE — Telephone Encounter (Signed)
Labs returned this evening and revealed Acute renal failure, acidosis, and anemia. Called patient, no answer. Spoke with HPOA and informed her of results and advised her to take patient to hospital immediately. Notified ED.

## 2015-07-22 NOTE — Assessment & Plan Note (Signed)
Appears benign but will need to see Derm for possible biopsy. Will arrange once out of hospital.

## 2015-07-22 NOTE — ED Notes (Addendum)
Patient ambulatory to triage with steady gait, without difficulty or distress noted; pt st was called by his PCP and told his "kidneys were failing"; pt denies any c/o; pt st only has 1 working kidney

## 2015-07-22 NOTE — Progress Notes (Signed)
Pre visit review using our clinic review tool, if applicable. No additional management support is needed unless otherwise documented below in the visit note. 

## 2015-07-23 ENCOUNTER — Encounter: Payer: Self-pay | Admitting: Urgent Care

## 2015-07-23 DIAGNOSIS — F1010 Alcohol abuse, uncomplicated: Secondary | ICD-10-CM | POA: Diagnosis present

## 2015-07-23 DIAGNOSIS — D72829 Elevated white blood cell count, unspecified: Secondary | ICD-10-CM | POA: Diagnosis present

## 2015-07-23 DIAGNOSIS — Z9081 Acquired absence of spleen: Secondary | ICD-10-CM | POA: Diagnosis not present

## 2015-07-23 DIAGNOSIS — Z8249 Family history of ischemic heart disease and other diseases of the circulatory system: Secondary | ICD-10-CM | POA: Diagnosis not present

## 2015-07-23 DIAGNOSIS — Z87442 Personal history of urinary calculi: Secondary | ICD-10-CM | POA: Diagnosis not present

## 2015-07-23 DIAGNOSIS — N261 Atrophy of kidney (terminal): Secondary | ICD-10-CM | POA: Diagnosis present

## 2015-07-23 DIAGNOSIS — R17 Unspecified jaundice: Secondary | ICD-10-CM | POA: Diagnosis present

## 2015-07-23 DIAGNOSIS — M109 Gout, unspecified: Secondary | ICD-10-CM | POA: Diagnosis present

## 2015-07-23 DIAGNOSIS — E860 Dehydration: Secondary | ICD-10-CM | POA: Diagnosis present

## 2015-07-23 DIAGNOSIS — F0280 Dementia in other diseases classified elsewhere without behavioral disturbance: Secondary | ICD-10-CM | POA: Diagnosis present

## 2015-07-23 DIAGNOSIS — E872 Acidosis: Secondary | ICD-10-CM | POA: Diagnosis present

## 2015-07-23 DIAGNOSIS — N179 Acute kidney failure, unspecified: Secondary | ICD-10-CM | POA: Diagnosis present

## 2015-07-23 DIAGNOSIS — F1097 Alcohol use, unspecified with alcohol-induced persisting dementia: Secondary | ICD-10-CM | POA: Diagnosis present

## 2015-07-23 DIAGNOSIS — E43 Unspecified severe protein-calorie malnutrition: Secondary | ICD-10-CM | POA: Diagnosis present

## 2015-07-23 DIAGNOSIS — R197 Diarrhea, unspecified: Secondary | ICD-10-CM | POA: Diagnosis present

## 2015-07-23 DIAGNOSIS — Z833 Family history of diabetes mellitus: Secondary | ICD-10-CM | POA: Diagnosis not present

## 2015-07-23 DIAGNOSIS — N183 Chronic kidney disease, stage 3 (moderate): Secondary | ICD-10-CM | POA: Diagnosis present

## 2015-07-23 DIAGNOSIS — F17200 Nicotine dependence, unspecified, uncomplicated: Secondary | ICD-10-CM | POA: Diagnosis present

## 2015-07-23 DIAGNOSIS — N400 Enlarged prostate without lower urinary tract symptoms: Secondary | ICD-10-CM | POA: Diagnosis present

## 2015-07-23 DIAGNOSIS — Z9889 Other specified postprocedural states: Secondary | ICD-10-CM | POA: Diagnosis not present

## 2015-07-23 DIAGNOSIS — Z79899 Other long term (current) drug therapy: Secondary | ICD-10-CM | POA: Diagnosis not present

## 2015-07-23 DIAGNOSIS — R7989 Other specified abnormal findings of blood chemistry: Secondary | ICD-10-CM | POA: Diagnosis present

## 2015-07-23 DIAGNOSIS — F0150 Vascular dementia without behavioral disturbance: Secondary | ICD-10-CM | POA: Diagnosis present

## 2015-07-23 DIAGNOSIS — E7800 Pure hypercholesterolemia, unspecified: Secondary | ICD-10-CM | POA: Diagnosis present

## 2015-07-23 DIAGNOSIS — R740 Nonspecific elevation of levels of transaminase and lactic acid dehydrogenase [LDH]: Secondary | ICD-10-CM | POA: Diagnosis present

## 2015-07-23 DIAGNOSIS — I129 Hypertensive chronic kidney disease with stage 1 through stage 4 chronic kidney disease, or unspecified chronic kidney disease: Secondary | ICD-10-CM | POA: Diagnosis present

## 2015-07-23 DIAGNOSIS — Z681 Body mass index (BMI) 19 or less, adult: Secondary | ICD-10-CM | POA: Diagnosis not present

## 2015-07-23 LAB — BASIC METABOLIC PANEL
Anion gap: 9 (ref 5–15)
BUN: 26 mg/dL — AB (ref 6–20)
CO2: 15 mmol/L — ABNORMAL LOW (ref 22–32)
CREATININE: 2.4 mg/dL — AB (ref 0.61–1.24)
Calcium: 8.3 mg/dL — ABNORMAL LOW (ref 8.9–10.3)
Chloride: 109 mmol/L (ref 101–111)
GFR calc Af Amer: 31 mL/min — ABNORMAL LOW (ref >60)
GFR, EST NON AFRICAN AMERICAN: 27 mL/min — AB (ref >60)
Glucose, Bld: 88 mg/dL (ref 65–99)
POTASSIUM: 3.2 mmol/L — AB (ref 3.5–5.1)
SODIUM: 133 mmol/L — AB (ref 135–145)

## 2015-07-23 LAB — URINALYSIS COMPLETE WITH MICROSCOPIC (ARMC ONLY)
BACTERIA UA: NONE SEEN
BILIRUBIN URINE: NEGATIVE
Glucose, UA: NEGATIVE mg/dL
HGB URINE DIPSTICK: NEGATIVE
KETONES UR: NEGATIVE mg/dL
LEUKOCYTES UA: NEGATIVE
NITRITE: NEGATIVE
PH: 6 (ref 5.0–8.0)
Protein, ur: NEGATIVE mg/dL
Specific Gravity, Urine: 1.013 (ref 1.005–1.030)

## 2015-07-23 LAB — AMMONIA: AMMONIA: 43 umol/L — AB (ref 9–35)

## 2015-07-23 LAB — COMPREHENSIVE METABOLIC PANEL
ALT: 43 U/L (ref 17–63)
ANION GAP: 7 (ref 5–15)
AST: 102 U/L — ABNORMAL HIGH (ref 15–41)
Albumin: 2.3 g/dL — ABNORMAL LOW (ref 3.5–5.0)
Alkaline Phosphatase: 512 U/L — ABNORMAL HIGH (ref 38–126)
BUN: 24 mg/dL — ABNORMAL HIGH (ref 6–20)
CHLORIDE: 111 mmol/L (ref 101–111)
CO2: 17 mmol/L — AB (ref 22–32)
Calcium: 8.2 mg/dL — ABNORMAL LOW (ref 8.9–10.3)
Creatinine, Ser: 2.26 mg/dL — ABNORMAL HIGH (ref 0.61–1.24)
GFR calc non Af Amer: 29 mL/min — ABNORMAL LOW (ref >60)
GFR, EST AFRICAN AMERICAN: 34 mL/min — AB (ref >60)
Glucose, Bld: 105 mg/dL — ABNORMAL HIGH (ref 65–99)
POTASSIUM: 3.3 mmol/L — AB (ref 3.5–5.1)
SODIUM: 135 mmol/L (ref 135–145)
Total Bilirubin: 3.1 mg/dL — ABNORMAL HIGH (ref 0.3–1.2)
Total Protein: 5.2 g/dL — ABNORMAL LOW (ref 6.5–8.1)

## 2015-07-23 LAB — CBC
HCT: 30.9 % — ABNORMAL LOW (ref 40.0–52.0)
Hemoglobin: 10.3 g/dL — ABNORMAL LOW (ref 13.0–18.0)
MCH: 39.5 pg — AB (ref 26.0–34.0)
MCHC: 33.1 g/dL (ref 32.0–36.0)
MCV: 119.2 fL — ABNORMAL HIGH (ref 80.0–100.0)
PLATELETS: 313 10*3/uL (ref 150–440)
RBC: 2.6 MIL/uL — AB (ref 4.40–5.90)
RDW: 16.6 % — ABNORMAL HIGH (ref 11.5–14.5)
WBC: 13.9 10*3/uL — ABNORMAL HIGH (ref 3.8–10.6)

## 2015-07-23 LAB — CREATININE, SERUM
CREATININE: 3.28 mg/dL — AB (ref 0.61–1.24)
GFR calc Af Amer: 22 mL/min — ABNORMAL LOW (ref >60)
GFR calc non Af Amer: 19 mL/min — ABNORMAL LOW (ref >60)

## 2015-07-23 LAB — C DIFFICILE QUICK SCREEN W PCR REFLEX
C Diff antigen: NEGATIVE
C Diff toxin: NEGATIVE

## 2015-07-23 MED ORDER — THIAMINE HCL 100 MG/ML IJ SOLN: 100.0000 mg | Freq: Every day | INTRAMUSCULAR | Status: DC

## 2015-07-23 MED ORDER — ONDANSETRON HCL 4 MG/2ML IJ SOLN
4.0000 mg | Freq: Four times a day (QID) | INTRAMUSCULAR | Status: DC | PRN
  Administered 2015-07-25: 4 mg via INTRAVENOUS
  Filled 2015-07-23: qty 2

## 2015-07-23 MED ORDER — LORAZEPAM 2 MG/ML IJ SOLN
1.0000 mg | Freq: Four times a day (QID) | INTRAMUSCULAR | Status: AC | PRN
  Administered 2015-07-25: 1 mg via INTRAVENOUS
  Filled 2015-07-23: qty 1

## 2015-07-23 MED ORDER — ENSURE ENLIVE PO LIQD
237.0000 mL | Freq: Two times a day (BID) | ORAL | Status: DC
  Administered 2015-07-23 – 2015-07-27 (×6): 237 mL via ORAL

## 2015-07-23 MED ORDER — VITAMIN B-1 100 MG PO TABS
100.0000 mg | ORAL_TABLET | Freq: Every day | ORAL | Status: DC
  Administered 2015-07-23 – 2015-07-27 (×5): 100 mg via ORAL
  Filled 2015-07-23 (×5): qty 1

## 2015-07-23 MED ORDER — TRAZODONE HCL 50 MG PO TABS
50.0000 mg | ORAL_TABLET | Freq: Every day | ORAL | Status: DC
  Administered 2015-07-23 – 2015-07-26 (×4): 50 mg via ORAL
  Filled 2015-07-23 (×4): qty 1

## 2015-07-23 MED ORDER — POTASSIUM CHLORIDE 20 MEQ PO PACK
40.0000 meq | PACK | Freq: Once | ORAL | Status: AC
  Administered 2015-07-23: 40 meq via ORAL
  Filled 2015-07-23: qty 2

## 2015-07-23 MED ORDER — FOLIC ACID 1 MG PO TABS
1.0000 mg | ORAL_TABLET | Freq: Every day | ORAL | Status: DC
  Administered 2015-07-23 – 2015-07-27 (×5): 1 mg via ORAL
  Filled 2015-07-23 (×5): qty 1

## 2015-07-23 MED ORDER — ONE-DAILY MULTI VITAMINS PO TABS: 1.0000 | ORAL_TABLET | Freq: Every day | ORAL | Status: DC

## 2015-07-23 MED ORDER — MORPHINE SULFATE (PF) 2 MG/ML IV SOLN
1.0000 mg | INTRAVENOUS | Status: DC | PRN
  Administered 2015-07-23 – 2015-07-26 (×10): 1 mg via INTRAVENOUS
  Filled 2015-07-23 (×11): qty 1

## 2015-07-23 MED ORDER — OXYCODONE HCL 5 MG PO TABS
5.0000 mg | ORAL_TABLET | ORAL | Status: DC | PRN
  Administered 2015-07-23 – 2015-07-27 (×17): 5 mg via ORAL
  Filled 2015-07-23 (×17): qty 1

## 2015-07-23 MED ORDER — ADULT MULTIVITAMIN W/MINERALS CH
1.0000 | ORAL_TABLET | Freq: Every day | ORAL | Status: DC
  Administered 2015-07-23 – 2015-07-27 (×5): 1 via ORAL
  Filled 2015-07-23 (×5): qty 1

## 2015-07-23 MED ORDER — ENOXAPARIN SODIUM 30 MG/0.3ML ~~LOC~~ SOLN
30.0000 mg | Freq: Every day | SUBCUTANEOUS | Status: DC
  Administered 2015-07-23 – 2015-07-24 (×2): 30 mg via SUBCUTANEOUS
  Filled 2015-07-23 (×2): qty 0.3

## 2015-07-23 MED ORDER — CALCIUM CARBONATE ANTACID 500 MG PO CHEW
2.0000 | CHEWABLE_TABLET | Freq: Four times a day (QID) | ORAL | Status: DC | PRN
  Administered 2015-07-23 – 2015-07-26 (×5): 400 mg via ORAL
  Filled 2015-07-23 (×5): qty 2

## 2015-07-23 MED ORDER — GI COCKTAIL ~~LOC~~
30.0000 mL | Freq: Once | ORAL | Status: AC
  Administered 2015-07-23: 30 mL via ORAL
  Filled 2015-07-23: qty 30

## 2015-07-23 MED ORDER — SODIUM CHLORIDE 0.9 % IV BOLUS (SEPSIS)
1000.0000 mL | Freq: Once | INTRAVENOUS | Status: AC
  Administered 2015-07-23: 1000 mL via INTRAVENOUS

## 2015-07-23 MED ORDER — LORAZEPAM 1 MG PO TABS: 1.0000 mg | ORAL_TABLET | Freq: Four times a day (QID) | ORAL | Status: AC | PRN

## 2015-07-23 MED ORDER — ONDANSETRON HCL 4 MG PO TABS: 4.0000 mg | ORAL_TABLET | Freq: Four times a day (QID) | ORAL | Status: DC | PRN

## 2015-07-23 MED ORDER — SODIUM CHLORIDE 0.9 % IV SOLN
INTRAVENOUS | Status: DC
  Administered 2015-07-23 – 2015-07-24 (×5): via INTRAVENOUS

## 2015-07-23 NOTE — ED Notes (Signed)
Warm blankets provided per patient request.

## 2015-07-23 NOTE — ED Notes (Signed)
Spoke with Dr. Posey Pronto - patient to be kept NPO overnight and have U/S of ABD in the am. U/S is aware.

## 2015-07-23 NOTE — Progress Notes (Signed)
Called Dr. Posey Pronto regarding patient having 3 loose stools.  Doctor ordered cdiff screening.  Christene Slates  07/23/2015  5:21 AM

## 2015-07-23 NOTE — ED Notes (Signed)
Spoke with EDP regarding patient's request for TUMS. MD with [facility]OR[facility] for 2 tab PO QID PRN heartburn. Order entered by this RN.

## 2015-07-23 NOTE — H&P (Addendum)
[facility] at [place] NAME: Edward Singleton    MR#:  [id number]  DATE OF BIRTH:  [birth year]  DATE OF ADMISSION:  07/22/2015  PRIMARY CARE PHYSICIAN: Edward Salt, DO   REQUESTING/REFERRING PHYSICIAN: Dr. Owens Singleton  CHIEF COMPLAINT:   Weakness, diarrhea, weight loss for several weeks. Patient reports he was sent in because my kidneys are failing HISTORY OF PRESENT ILLNESS:  Edward Singleton  is a 64 y.o. male with a known history of chronic alcohol abuse, nephrolithiasis, hypertension, hypercholesterolemia comes into the emergency room accompanied by patient's sisters all with a low chief complaint. According to the patient and he's been having diarrhea and generalized weakness for last several weeks. His diet has gotten worsened to the point he has been having about 8-9 bowel movements per day. He lives alone. He is able to ambulate however during due to weakness not been able to do so lately. Recently saw his. PCP Edward Singleton ordered some routine blood work and was found to have elevated creatinine. His baseline creatinine is 1.06. Creatinine today was 2.59. Patient currently is getting IV fluids. Denies any fever or chest pain shortness of breath or dysuria. He complains of some left flank pain. Patient reports passing a kidney stone a few weeks ago. He follows [facility].  PAST MEDICAL HISTORY:   Past Medical History  Diagnosis Date  . Hypertension   . Hypercholesteremia   . Gout   . Pelvic fracture ([place]) 11/17/2014  . Benign enlargement of prostate   . Kidney stones   . Atrophic kidney   . Left flank pain, chronic   . 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 Alcohol abuse     PAST SURGICAL HISTOIRY:   Past Surgical History  Procedure Laterality Date  . Orif humerus fracture Left 11/18/2014    Procedure: OPEN REDUCTION INTERNAL FIXATION (ORIF) DISTAL HUMERUS FRACTURE;  Surgeon: Edward Box, MD;  Location: [facility];  Service: Orthopedics;   Laterality: Left;  . Lithotripsy    . Left renal stent placement  2013  . Splenectomy      SOCIAL HISTORY:   Social History  Substance Use Topics  . Smoking status: Current Every Day Smoker -- 1.00 packs/day for .5 years    Types: Cigarettes  . Smokeless tobacco: Never Used  . Alcohol Use: 25.2 oz/week    42 Cans of beer, 0 Standard drinks or equivalent per week    FAMILY HISTORY:   Family History  Problem Relation Age of Onset  . Nephrolithiasis Paternal Grandfather   . Kidney disease Neg Hx   . Prostate cancer Neg Hx   . Heart disease Mother   . Heart disease Father   . Diabetes Father     DRUG ALLERGIES:  No Known Allergies  REVIEW OF SYSTEMS:  Review of Systems  Constitutional: Positive for weight loss and malaise/fatigue. Negative for fever and chills.  HENT: Negative for ear discharge, ear pain and nosebleeds.   Eyes: Negative for blurred vision, pain and discharge.  Respiratory: Negative for sputum production, shortness of breath, wheezing and stridor.   Cardiovascular: Negative for chest pain, palpitations, orthopnea and PND.  Gastrointestinal: Positive for nausea and diarrhea. Negative for vomiting and abdominal pain.  Genitourinary: Negative for urgency and frequency.  Musculoskeletal: Negative for back pain and joint pain.  Neurological: Positive for weakness. Negative for sensory change, speech change and focal weakness.  Psychiatric/Behavioral: Negative for depression. The patient is not  nervous/anxious.      MEDICATIONS AT HOME:   Prior to Admission medications   Medication Sig Start Date End Date Taking? Authorizing Provider  allopurinol (ZYLOPRIM) 300 MG tablet Take 0.5 tablets (150 mg total) by mouth daily. 07/07/15  Yes Edward Staggers, MD  metoprolol tartrate (LOPRESSOR) 25 MG tablet Take 1 tablet (25 mg total) by mouth 2 (two) times daily. 07/07/15  Yes Edward Staggers, MD  Multiple Vitamin (MULTIVITAMIN) tablet Take 1 tablet by mouth daily.  07/07/15  Yes Edward Staggers, MD  traZODone (DESYREL) 50 MG tablet Take 1 tablet (50 mg total) by mouth at bedtime. 07/07/15  Yes Edward Staggers, MD      VITAL SIGNS:  Blood pressure 117/72, pulse 66, temperature 98 F (36.7 C), temperature source Oral, resp. rate 18, height 5\' 7"  (1.702 m), weight 56.7 kg (125 lb), SpO2 100 %.  PHYSICAL EXAMINATION:  GENERAL:  64 y.o.-year-old patient lying in the bed with no acute distress. Weak and frail. EYES: Pupils equal, round, reactive to light and accommodation. Positive scleral icterus. Extraocular muscles intact.  HEENT: Head atraumatic, normocephalic. Oropharynx and nasopharynx clear.  NECK:  Supple, no jugular venous distention. No thyroid enlargement, no tenderness.  LUNGS: Normal breath sounds bilaterally, no wheezing, rales,rhonchi or crepitation. No use of accessory muscles of respiration.  CARDIOVASCULAR: S1, S2 normal. No murmurs, rubs, or gallops.  ABDOMEN: Soft, nontender, nondistended. Bowel sounds present. No organomegaly or mass.  EXTREMITIES: No pedal edema, cyanosis, or clubbing.  NEUROLOGIC: Cranial nerves II through XII are intact. Muscle strength 5/5 in all extremities. Sensation intact. Gait not checked. No tremors. Generalized weakness. PSYCHIATRIC: The patient is alert and oriented x 3.  SKIN: No obvious rash, lesion, or ulcer.   LABORATORY PANEL:   CBC  Recent Labs Lab 07/22/15 1207  WBC 14.2*  HGB 10.0*  HCT 30.9*  PLT 317.0   ------------------------------------------------------------------------------------------------------------------  Chemistries   Recent Labs Lab 07/22/15 1207  NA 127*  K 3.8  CL 99  CO2 16*  GLUCOSE 120*  BUN 25*  CREATININE 2.69*  CALCIUM 9.4  AST 152*  ALT 63*  ALKPHOS 824*  BILITOT 5.2*     IMPRESSION AND PLAN:   64 year old [name] with past medical history of fall with multiple fractures, hypertension, ongoing alcohol abuse, protein calorie malnutrition, BPH,  hypertension, so the emergency room with  1. Acute renal failure likely secondary to prerenal azotemia from GI losses. Patient reports having diarrhea on and off for last several weeks. Admit to medical floor. Clear liquid diet. IV fluids for hydration. Monitor I's and O's. Avoid nephrotoxins. Monitor creatinine. Baseline creatinine is 1.06 (June 2016) Consider nephrology consultation of needed.  2. Elevated transaminases with elevated bilirubin in patient with history of ongoing history of alcoholism. Patient appears more jaundiced today her family. We'll get an ultrasound of the abdomen. Consider a GI consultation if needed.  3. Hypercholesterolemia. Patient currently is not on any statins. Given his elevated LFTs L hold off on starting him on any statins. We can defer this to his primary care physician.  4. Hypertension We'll resume by mouth home meds once blood pressure more stable.  5. Generalized weakness deconditioning weight loss PT to see patient.  6 severe protein calorie malnutrition/ongoing weight loss We'll have dietitian see patient.  7. Mild leukocytosis No source of infection identified. UA pending  8. Elevated ammonia in the setting of patient with alcohol abuse. Patient does not have any clinically tremors. He is  mentating well. Given his ongoing diarrhea and dehydration number to hold off on lactulose at present.  All the records are reviewed and case discussed with ED provider. Management plans discussed with the patient, family and they are in agreement.  CODE STATUS: Full for now until family makes decision  TOTAL TIME TAKING CARE OF THIS PATIENT: 50 minutes.    Kelly Ranieri M.D on 07/23/2015 at 1:07 AM  Between 7am to 6pm - Pager - [phone]  After 6pm go to [email or web address] - password [email or web address] [facility]  Office  [phone]  CC: Primary care physician; Edward Salt, DO

## 2015-07-23 NOTE — ED Notes (Signed)
Dr. Owens Shark presents to bedside for patient assessment. Patient endorsing etoh use; 6 beers every night.

## 2015-07-23 NOTE — Progress Notes (Signed)
[facility] at [place] NAME: Edward Singleton    MR#:  [id number]  DATE OF BIRTH:  [birth year]  SUBJECTIVE:  CHIEF COMPLAINT:  Patient is  complaining of no other episodes of diarrhea since admitted and left lower quadrant abdominal pain. Tolerating diet. Denies any past medical history of renal insufficiency  REVIEW OF SYSTEMS:  CONSTITUTIONAL: No fever, fatigue or weakness.  EYES: No blurred or double vision.  EARS, NOSE, AND THROAT: No tinnitus or ear pain.  RESPIRATORY: No cough, shortness of breath, wheezing or hemoptysis.  CARDIOVASCULAR: No chest pain, orthopnea, edema.  GASTROINTESTINAL: No nausea, vomiting, diarrhea .reporting left lower quadrant abdominal pain.  GENITOURINARY: No dysuria, hematuria.  ENDOCRINE: No polyuria, nocturia,  HEMATOLOGY: No anemia, easy bruising or bleeding SKIN: No rash or lesion. MUSCULOSKELETAL: No joint pain or arthritis.   NEUROLOGIC: No tingling, numbness, weakness.  PSYCHIATRY: No anxiety or depression.   DRUG ALLERGIES:  No Known Allergies  VITALS:  Blood pressure 122/69, pulse 73, temperature 97.5 F (36.4 C), temperature source Oral, resp. rate 16, height 5\' 7"  (1.702 m), weight 56.7 kg (125 lb), SpO2 100 %.  PHYSICAL EXAMINATION:  GENERAL:  64 y.o.-year-old patient lying in the bed with no acute distress.  EYES: Pupils equal, round, reactive to light and accommodation. No scleral icterus. Extraocular muscles intact.  HEENT: Head atraumatic, normocephalic. Oropharynx and nasopharynx clear.  NECK:  Supple, no jugular venous distention. No thyroid enlargement, no tenderness.  LUNGS: Normal breath sounds bilaterally, no wheezing, rales,rhonchi or crepitation. No use of accessory muscles of respiration.  CARDIOVASCULAR: S1, S2 normal. No murmurs, rubs, or gallops.  ABDOMEN: Soft, left lower quadrant is tender, no rebound tenderness, nondistended. Bowel sounds present. No organomegaly or mass.   EXTREMITIES: No pedal edema, cyanosis, or clubbing.  NEUROLOGIC: Cranial nerves II through XII are intact. Muscle strength 5/5 in all extremities. Sensation intact. Gait not checked.  PSYCHIATRIC: The patient is alert and oriented x 3.  SKIN: No obvious rash, lesion, or ulcer.    LABORATORY PANEL:   CBC  Recent Labs Lab 07/22/15 2324  WBC 13.9*  HGB 10.3*  HCT 30.9*  PLT 313   ------------------------------------------------------------------------------------------------------------------  Chemistries   Recent Labs Lab 07/22/15 1207  07/23/15 0927  NA 127*  --  133*  K 3.8  --  3.2*  CL 99  --  109  CO2 16*  --  15*  GLUCOSE 120*  --  88  BUN 25*  --  26*  CREATININE 2.69*  < > 2.40*  CALCIUM 9.4  --  8.3*  AST 152*  --   --   ALT 63*  --   --   ALKPHOS 824*  --   --   BILITOT 5.2*  --   --   < > = values in this interval not displayed. ------------------------------------------------------------------------------------------------------------------  Cardiac Enzymes No results for input(s): TROPONINI in the last 168 hours. ------------------------------------------------------------------------------------------------------------------  RADIOLOGY:  No results found.  EKG:   Orders placed or performed during the hospital encounter of 03/24/15  . ED EKG  . ED EKG    ASSESSMENT AND PLAN:   64 year old Edward Singleton with past medical history of fall with multiple fractures, hypertension, ongoing alcohol abuse, protein calorie malnutrition, BPH, hypertension, so the emergency room with  1. Acute renal failure likely secondary to prerenal azotemia from GI losses. Creatinine is worse today  Patient reports having diarrhea on and off for last several weeks, but denies  any since admission Tolerating diet IV fluids for hydration. Monitor I's and O's. Avoid nephrotoxins. Monitor creatinine. Baseline creatinine is 1.06 (June 2016) nephrology consultation  ordered CT scan is rescheduled for tomorrow as patient ate today  2. Elevated transaminases with elevated bilirubin in patient with history of ongoing history of alcoholism. Patient is awake and alert. Repeat CMP in a.m. We'll get an ultrasound of the abdomen if no improvement  Consider a GI consultation if needed.  3. Hypercholesterolemia. Patient currently is not on any statins. Given his elevated LFTs L hold off on starting him on any statins. We can defer this to his primary care physician.   4. Hypertension We'll resume by mouth home meds once blood pressure more stable.  5. Generalized weakness deconditioning weight loss PT to see patient.  6 severe protein calorie malnutrition/ongoing weight loss We'll follow up with  dietitian   7. Mild leukocytosis No source of infection identified. UA pending  8. Elevated ammonia in the setting of patient with alcohol abuse. Patient does not have any clinically tremors. He is mentating well. Given his ongoing diarrhea and dehydration number to hold off on lactulose at present. .      All the records are reviewed and case discussed with Care Management/Social Workerr. Management plans discussed with the patient, family and they are in agreement.  CODE STATUS: Full for now until family makes decision   TOTAL TIME TAKING CARE OF THIS PATIENT: 35 minutes.   POSSIBLE D/C IN 2=3  DAYS, DEPENDING ON CLINICAL CONDITION.   Nicholes Mango M.D on 07/23/2015 at 3:13 PM  Between 7am to 6pm - Pager - [phone] After 6pm go to [email or web address] - password [email or web address] [facility]  Office  [phone]  CC: Primary care physician; Thersa Salt, DO

## 2015-07-23 NOTE — Evaluation (Signed)
Physical Therapy Evaluation Patient Details Name: Edward Singleton MRN: [id number] DOB: [birth year] Today's Date: 07/23/2015   History of Present Illness  Diontae Zeller is a 64 y.o. male with a known history of chronic alcohol abuse, nephrolithiasis, hypertension, hypercholesterolemia comes into the emergency room accompanied by patient's sisters all with a low chief complaint. According to the patient and he's been having diarrhea and generalized weakness for last several weeks. His diet has gotten worsened to the point he has been having about 8-9 bowel movements per day. He lives alone. He is able to ambulate however during due to weakness not been able to do so lately  Clinical Impression  Pt was able to display independence with all forms of mobility (bed, transfers, gait) and at no point during evaluation did he display instances of imbalance or report pain with movement. Pt is typically independent with all ADLs and appears to be at baseline level of functioning at this point in time. Pt does not require further follow up from PT or continued acute PT services while admitted.     Follow Up Recommendations No PT follow up    Equipment Recommendations       Recommendations for Other Services       Precautions / Restrictions Precautions Precautions: None Restrictions Weight Bearing Restrictions: No      Mobility  Bed Mobility Overal bed mobility: Independent                Transfers Overall transfer level: Independent                  Ambulation/Gait Ambulation/Gait assistance: Independent Ambulation Distance (Feet): 400 Feet   Gait Pattern/deviations: WFL(Within Functional Limits)        Stairs            Wheelchair Mobility    Modified Rankin (Stroke Patients Only)       Balance Overall balance assessment: No apparent balance deficits (not formally assessed)                               Standardized Balance  Assessment Standardized Balance Assessment :  (5xSTS 12 seconds)           Pertinent Vitals/Pain Pain Assessment: No/denies pain    Home Living Family/patient expects to be discharged to:: Private residence Living Arrangements: Alone Available Help at Discharge: Family Type of Home: House Home Access: Stairs to enter Entrance Stairs-Rails: Left Entrance Stairs-Number of Steps: 5 Home Layout: One level Home Equipment: Cane - single point      Prior Function Level of Independence: Independent               Hand Dominance        Extremity/Trunk Assessment   Upper Extremity Assessment: Overall WFL for tasks assessed           Lower Extremity Assessment: Overall WFL for tasks assessed (gross BLE strength at least 4/5 via MMT and functional movement)         Communication   Communication: No difficulties  Cognition Arousal/Alertness: Awake/alert Behavior During Therapy: WFL for tasks assessed/performed Overall Cognitive Status: Within Functional Limits for tasks assessed                      General Comments      Exercises        Assessment/Plan    PT Assessment Patent does not need any further  PT services  PT Diagnosis     PT Problem List    PT Treatment Interventions     PT Goals (Current goals can be found in the Care Plan section) Acute Rehab PT Goals Patient Stated Goal: to go home PT Goal Formulation: With patient Time For Goal Achievement: 08/06/15 Potential to Achieve Goals: Good    Frequency     Barriers to discharge        Co-evaluation               End of Session Equipment Utilized During Treatment: Gait belt Activity Tolerance: Patient tolerated treatment well Patient left: in bed;with bed alarm set;with call bell/phone within reach           Time: 1110-1130 PT Time Calculation (min) (ACUTE ONLY): 20 min   Charges:         PT G CodesMilon Score 07/23/2015, 11:46 AM

## 2015-07-23 NOTE — ED Provider Notes (Signed)
[facility] Emergency Department Provider Note  ____________________________________________  Time seen: 12:05 AM  I have reviewed the triage vital signs and the nursing notes.   HISTORY  Chief Complaint Abnormal Lab     HPI Edward Singleton is a 64 y.o. male presents with referral from Dr. Lacinda Axon for "kidneys are failing". Patient states that he's had nonbloody diarrhea for weeks averaging multiple episodes per day. Patient's sisters at bedside states that he's had a total of 10 pound weight loss in the past week. Patient denies any fever or vomiting. Patient is alert admit to generalized muscle cramping. Patient and sisters state the patient has had worsening mentation over that period of time as well. They state that the patient has a history of chronic alcoholism he states that he drink to proximally 6 beers per day and has been doing so for very long time however he denies any liver disease that he knows of.     Past Medical History  Diagnosis Date  . Hypertension   . Hypercholesteremia   . Gout   . Pelvic fracture ([place]) 11/17/2014  . Benign enlargement of prostate   . Kidney stones   . Atrophic kidney   . Left flank pain, chronic   . 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 Alcohol abuse     Patient Active Problem List   Diagnosis Date Noted  . Insomnia 07/22/2015  . Diarrhea 07/22/2015  . ARF (acute renal failure) ([place]) 07/22/2015  . Loss of weight 07/22/2015  . Skin lesion 07/22/2015  . History of anemia 07/15/2015  . History of substance abuse 07/15/2015  . BPH (benign prostatic hyperplasia) 07/15/2015  . Gout 07/15/2015  . Coronary artery calcification 07/15/2015  . Kidney stones 05/11/2015  . 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 Protein-calorie malnutrition, severe ([place]) 11/19/2014  . Fall with multiple fractures 11/17/2014  . HTN (hypertension) 11/17/2014  . Alcohol abuse 11/17/2014  . Dyslipidemia 11/17/2014  . History of  nonadherence to medical treatment 11/17/2014    Past Surgical History  Procedure Laterality Date  . Orif humerus fracture Left 11/18/2014    Procedure: OPEN REDUCTION INTERNAL FIXATION (ORIF) DISTAL HUMERUS FRACTURE;  Surgeon: Rozanna Box, MD;  Location: [facility];  Service: Orthopedics;  Laterality: Left;  . Lithotripsy    . Left renal stent placement  2013  . Splenectomy      Current Outpatient Rx  Name  Route  Sig  Dispense  Refill  . allopurinol (ZYLOPRIM) 300 MG tablet   Oral   Take 0.5 tablets (150 mg total) by mouth daily.   30 tablet   4   . metoprolol tartrate (LOPRESSOR) 25 MG tablet   Oral   Take 1 tablet (25 mg total) by mouth 2 (two) times daily.   60 tablet   4   . Multiple Vitamin (MULTIVITAMIN) tablet   Oral   Take 1 tablet by mouth daily.   30 tablet   4   . traZODone (DESYREL) 50 MG tablet   Oral   Take 1 tablet (50 mg total) by mouth at bedtime.   30 tablet   4     Allergies Review of patient's allergies indicates no known allergies.  Family History  Problem Relation Age of Onset  . Nephrolithiasis Paternal Grandfather   . Kidney disease Neg Hx   . Prostate cancer Neg Hx   . Heart disease Mother   . Heart disease Father   .  Diabetes Father     Social History Social History  Substance Use Topics  . Smoking status: Current Every Day Smoker -- 1.00 packs/day for .5 years    Types: Cigarettes  . Smokeless tobacco: Never Used  . Alcohol Use: 25.2 oz/week    42 Cans of beer, 0 Standard drinks or equivalent per week    Review of Systems  Constitutional: Negative for fever. Eyes: Negative for visual changes. ENT: Negative for sore throat. Cardiovascular: Negative for chest pain. Respiratory: Negative for shortness of breath. Gastrointestinal: Negative for abdominal pain, vomiting. positive for diarrhea. Genitourinary: Negative for dysuria. Musculoskeletal: Negative for back pain. Skin: Negative for rash. Neurological: Negative for  headaches, focal weakness or numbness. Psychiatric: Positive for confusion and memory disturbances.  10-point ROS otherwise negative.  ____________________________________________   PHYSICAL EXAM:  VITAL SIGNS: ED Triage Vitals  Enc Vitals Group     BP 07/22/15 2309 85/53 mmHg     Pulse Rate 07/22/15 2309 80     Resp 07/22/15 2309 18     Temp 07/22/15 2309 98 F (36.7 C)     Temp Source 07/22/15 2309 Oral     SpO2 07/22/15 2309 97 %     Weight 07/22/15 2309 125 lb (56.7 kg)     Height 07/22/15 2309 5\' 7"  (1.702 m)     Head Cir --      Peak Flow --      Pain Score --      Pain Loc --      Pain Edu? --      Excl. in [place]? --      Constitutional: Alert and oriented. Well appearing and in no distress. Eyes: Scleral icterus. PERRL. Normal extraocular movements. ENT   Head: Normocephalic and atraumatic.   Nose: No congestion/rhinnorhea.   Mouth/Throat: Dry oral and nasal mucosa   Neck: No stridor. Hematological/Lymphatic/Immunilogical: No cervical lymphadenopathy. Cardiovascular: Normal rate, regular rhythm. Normal and symmetric distal pulses are present in all extremities. No murmurs, rubs, or gallops. Respiratory: Normal respiratory effort without tachypnea nor retractions. Breath sounds are clear and equal bilaterally. No wheezes/rales/rhonchi. Gastrointestinal: Soft and nontender. No distention. There is no CVA tenderness. Genitourinary: deferred Musculoskeletal: Nontender with normal range of motion in all extremities. No joint effusions.  No lower extremity tenderness nor edema. Neurologic:  Normal speech and language. No gross focal neurologic deficits are appreciated. Speech is normal.  Skin:  Skin is warm, dry and intact. No rash noted. Psychiatric: Mood and affect are normal. Speech and behavior are normal. Patient exhibits appropriate insight and judgment.     INITIAL IMPRESSION / ASSESSMENT AND PLAN / ED COURSE  Pertinent labs & imaging results  that were available during my care of the patient were reviewed by me and considered in my medical decision making (see chart for details).  Review of the patient's lab data revealed a increase in his creatinine from 1 in June 2016 22.69 today. Also concern about a patient's jaundice state and confusion concern for possible liver disease as such ammonia level sent. Patient received 1 L normal saline in the emergency department. Patient was discussed with Dr. Posey Pronto for hospital admission for further evaluation and treatment.  ____________________________________________   FINAL CLINICAL IMPRESSION(S) / ED DIAGNOSES  Final diagnoses:  Acute renal failure, unspecified acute renal failure type [facility])  Jaundice      Gregor Hams, MD 07/23/15 [phone]

## 2015-07-23 NOTE — Progress Notes (Signed)
Initial Nutrition Assessment  DOCUMENTATION CODES:   Severe malnutrition in context of chronic illness  INTERVENTION:   Meals and Snacks: Cater to patient preferences Medical Food Supplement Therapy: will recommend Ensure Enlive po BID, each supplement provides 350 kcal and 20 grams of protein Coordination of Care: Recommend daily weights   NUTRITION DIAGNOSIS:   Malnutrition related to chronic illness as evidenced by severe depletion of muscle mass, percent weight loss.  GOAL:   Patient will meet greater than or equal to 90% of their needs  MONITOR:    (Energy Intake, Anthropometrics, Digestive System, Electrolyte and renal Profile)  REASON FOR ASSESSMENT:   Malnutrition Screening Tool    ASSESSMENT:   Pt admitted with acute renal failure with c/o diarrhea and weakness for the past few weeks. Pt scheduled for CT tomorrow.   Of note, RD assessment recommended 'Non-severe (moderate) malnutrition' during February 2016 admission.  Past Medical History  Diagnosis Date  . Hypertension   . Hypercholesteremia   . Gout   . Pelvic fracture ([place]) 11/17/2014  . Benign enlargement of prostate   . Kidney stones   . Atrophic kidney   . Left flank pain, chronic   . 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 Alcohol abuse     Diet Order:  DIET SOFT Room service appropriate?: Yes; Fluid consistency:: Thin Diet NPO time specified Except for: Ice Chips    Current Nutrition: Pt reports he cannot remember if he ate lunch today on visit. Per CNA, pt ate but not a lot.   Food/Nutrition-Related History: Pt reports appetite was doing 'ok.' Pt reports not eating a lot but usually would eat eggs in the morning with a Boost, sandwich for lunch and dinner would be a meal such as spaghetti, fish, chicken. RD notes pt with h/o EtOH use. Pt reports he lives at home, so small meals usually; not making a lot of 'big' meals for himself. RD also notes per H&P diet was down PTA and pt with diarrhea.     Medications: NS at 147mL/hr, thiamine, MVI, folic acid   Electrolyte/Renal Profile and Glucose Profile:   Recent Labs Lab 07/22/15 1207 07/22/15 2324 07/23/15 0927  NA 127*  --  133*  K 3.8  --  3.2*  CL 99  --  109  CO2 16*  --  15*  BUN 25*  --  26*  CREATININE 2.69* 3.28* 2.40*  CALCIUM 9.4  --  8.3*  GLUCOSE 120*  --  88   Protein Profile:  Recent Labs Lab 07/22/15 1207  ALBUMIN 3.4*    Gastrointestinal Profile: Last BM:  07/23/2015   Nutrition-Focused Physical Exam Findings: Nutrition-Focused physical exam completed. Findings are no fat depletion, severe muscle depletion, and no edema.     Weight Change: Pt reports 10lbs weight loss in the past week. Per [facility] pt with 24% weight loss since February 2016, 11% weight loss in the past 4 months.   Skin:   (Jaundiced)  Height:   Ht Readings from Last 1 Encounters:  07/22/15 5\' 7"  (1.702 m)    Weight:   Wt Readings from Last 1 Encounters:  07/22/15 125 lb (56.7 kg)    Wt Readings from Last 10 Encounters:  07/22/15 125 lb (56.7 kg)  07/22/15 114 lb 1.9 oz (51.764 kg)  07/15/15 124 lb 2 oz (56.303 kg)  05/11/15 126 lb 6.4 oz (57.335 kg)  03/24/15 140 lb (63.504 kg)  11/17/14 165 lb (74.844 kg)  BMI:  Body mass index is 19.57 kg/(m^2).  Estimated Nutritional Needs:   Kcal:  BEE: 1315kcals, TEE: (IF 1.1-1.3)(AF 1.3) 1881-2223kcals  Protein:  57-68g protein (1.0-1.2g/kg)  Fluid:  1418-1787mL of fluid (25-26mL/kg)  EDUCATION NEEDS:   No education needs identified at this time   Warner Robins, RD, LDN Pager [phone]

## 2015-07-24 ENCOUNTER — Inpatient Hospital Stay: Payer: 59

## 2015-07-24 ENCOUNTER — Telehealth: Payer: Self-pay | Admitting: Family Medicine

## 2015-07-24 LAB — COMPREHENSIVE METABOLIC PANEL
ALBUMIN: 2.4 g/dL — AB (ref 3.5–5.0)
ALK PHOS: 433 U/L — AB (ref 38–126)
ALT: 38 U/L (ref 17–63)
ANION GAP: 9 (ref 5–15)
AST: 80 U/L — ABNORMAL HIGH (ref 15–41)
BILIRUBIN TOTAL: 2.6 mg/dL — AB (ref 0.3–1.2)
BUN: 16 mg/dL (ref 6–20)
CALCIUM: 8.1 mg/dL — AB (ref 8.9–10.3)
CO2: 21 mmol/L — ABNORMAL LOW (ref 22–32)
Chloride: 104 mmol/L (ref 101–111)
Creatinine, Ser: 1.84 mg/dL — ABNORMAL HIGH (ref 0.61–1.24)
GFR, EST AFRICAN AMERICAN: 43 mL/min — AB (ref >60)
GFR, EST NON AFRICAN AMERICAN: 37 mL/min — AB (ref >60)
GLUCOSE: 101 mg/dL — AB (ref 65–99)
Potassium: 2.9 mmol/L — CL (ref 3.5–5.1)
Sodium: 134 mmol/L — ABNORMAL LOW (ref 135–145)
TOTAL PROTEIN: 5.2 g/dL — AB (ref 6.5–8.1)

## 2015-07-24 LAB — BASIC METABOLIC PANEL
Anion gap: 7 (ref 5–15)
BUN: 21 mg/dL — AB (ref 6–20)
CHLORIDE: 114 mmol/L — AB (ref 101–111)
CO2: 14 mmol/L — AB (ref 22–32)
CREATININE: 1.84 mg/dL — AB (ref 0.61–1.24)
Calcium: 8.3 mg/dL — ABNORMAL LOW (ref 8.9–10.3)
GFR calc Af Amer: 43 mL/min — ABNORMAL LOW (ref >60)
GFR calc non Af Amer: 37 mL/min — ABNORMAL LOW (ref >60)
GLUCOSE: 80 mg/dL (ref 65–99)
Potassium: 3.4 mmol/L — ABNORMAL LOW (ref 3.5–5.1)
Sodium: 135 mmol/L (ref 135–145)

## 2015-07-24 LAB — AMMONIA: Ammonia: 54 umol/L — ABNORMAL HIGH (ref 9–35)

## 2015-07-24 LAB — CBC
HEMATOCRIT: 23.3 % — AB (ref 40.0–52.0)
HEMOGLOBIN: 7.8 g/dL — AB (ref 13.0–18.0)
MCH: 40 pg — AB (ref 26.0–34.0)
MCHC: 33.3 g/dL (ref 32.0–36.0)
MCV: 120.2 fL — AB (ref 80.0–100.0)
Platelets: 263 10*3/uL (ref 150–440)
RBC: 1.94 MIL/uL — ABNORMAL LOW (ref 4.40–5.90)
RDW: 16.1 % — ABNORMAL HIGH (ref 11.5–14.5)
WBC: 14.7 10*3/uL — ABNORMAL HIGH (ref 3.8–10.6)

## 2015-07-24 LAB — MAGNESIUM: Magnesium: 1.2 mg/dL — ABNORMAL LOW (ref 1.7–2.4)

## 2015-07-24 MED ORDER — SODIUM BICARBONATE 8.4 % IV SOLN
INTRAVENOUS | Status: DC
  Administered 2015-07-24 – 2015-07-26 (×4): via INTRAVENOUS
  Filled 2015-07-24 (×5): qty 850

## 2015-07-24 MED ORDER — ENOXAPARIN SODIUM 40 MG/0.4ML ~~LOC~~ SOLN
40.0000 mg | Freq: Every day | SUBCUTANEOUS | Status: DC
  Administered 2015-07-25 – 2015-07-26 (×2): 40 mg via SUBCUTANEOUS
  Filled 2015-07-24 (×2): qty 0.4

## 2015-07-24 MED ORDER — MAGNESIUM SULFATE 2 GM/50ML IV SOLN
2.0000 g | Freq: Once | INTRAVENOUS | Status: AC
  Administered 2015-07-24: 2 g via INTRAVENOUS
  Filled 2015-07-24: qty 50

## 2015-07-24 MED ORDER — POTASSIUM CHLORIDE CRYS ER 20 MEQ PO TBCR
40.0000 meq | EXTENDED_RELEASE_TABLET | Freq: Once | ORAL | Status: AC
  Administered 2015-07-24: 40 meq via ORAL
  Filled 2015-07-24: qty 2

## 2015-07-24 MED ORDER — NICOTINE 14 MG/24HR TD PT24
14.0000 mg | MEDICATED_PATCH | Freq: Every day | TRANSDERMAL | Status: DC
  Administered 2015-07-24 – 2015-07-27 (×4): 14 mg via TRANSDERMAL
  Filled 2015-07-24 (×4): qty 1

## 2015-07-24 NOTE — Telephone Encounter (Signed)
Spoke with sister about patient's ongoing care at [facility].

## 2015-07-24 NOTE — Clinical Documentation Improvement (Signed)
Internal Medicine  Possible Clinical Conditions associated with below indicators  Hyponatremia  Other Condition  Cannot Clinically Determine   Supporting Information:  Admitted with Acute renal failure Dehydration  Diarrhea Malnutrition  Abnormal Lab/Test Results:   Sodium: Ref Range: 135-145  10/12   127 10/13    133; 135 10/14    135  Treatment Provided:  IV NS @100ml /h   Please exercise your independent, professional judgment when responding. A specific answer is not anticipated or expected.   Thank You,  [place] [phone]

## 2015-07-24 NOTE — Progress Notes (Signed)
[facility]  ROUNDING NOTE   Subjective:  Pt well known to [place] as we follow him for outpt CKD. Baseline Cr 1.4.  Has been having diarrhea at home. Has been lost to follow up to [place] for a year. Was having several bowel movements per day which were mainly watery.    Objective:  Vital signs in last 24 hours:  Temp:  [97.5 F (36.4 C)-98 F (36.7 C)] 98 F (36.7 C) (10/14 0522) Pulse Rate:  [73-82] 82 (10/14 0522) Resp:  [16-18] 18 (10/14 0522) BP: (114-140)/(53-77) 114/53 mmHg (10/14 0522) SpO2:  [99 %-100 %] 99 % (10/14 0522)  Weight change:  Filed Weights   07/22/15 2309  Weight: 56.7 kg (125 lb)    Intake/Output: I/O last 3 completed shifts: In: 2701 [I.V.:2701] Out: 500 [Urine:300; Stool:200]   Intake/Output this shift:  Total I/O In: 383 [I.V.:383] Out: 50 [Urine:50]  Physical Exam: General: NAD, slender  Head: Normocephalic, atraumatic. Moist oral mucosal membranes  Eyes: Anicteric, PERRL  Neck: Supple, trachea midline  Lungs:  Clear to auscultation normal effort  Heart: Regular rate and rhythm  Abdomen:  Soft, nontender, BS present  Extremities: no peripheral edema.  Neurologic: Nonfocal, moving all four extremities  Skin: No lesions       Basic Metabolic Panel:  Recent Labs Lab 07/22/15 1207 07/22/15 2324 07/23/15 0927 07/23/15 1643 07/24/15 0507  NA 127*  --  133* 135 135  K 3.8  --  3.2* 3.3* 3.4*  CL 99  --  109 111 114*  CO2 16*  --  15* 17* 14*  GLUCOSE 120*  --  88 105* 80  BUN 25*  --  26* 24* 21*  CREATININE 2.69* 3.28* 2.40* 2.26* 1.84*  CALCIUM 9.4  --  8.3* 8.2* 8.3*  MG  --   --   --   --  1.2*    Liver Function Tests:  Recent Labs Lab 07/22/15 1207 07/23/15 1643  AST 152* 102*  ALT 63* 43  ALKPHOS 824* 512*  BILITOT 5.2* 3.1*  PROT 6.5 5.2*  ALBUMIN 3.4* 2.3*   No results for input(s): LIPASE, AMYLASE in the last 168 hours.  Recent Labs Lab 07/23/15 0011 07/24/15 0506  AMMONIA 43* 54*     CBC:  Recent Labs Lab 07/22/15 1207 07/22/15 2324 07/24/15 0507  WBC 14.2* 13.9* 14.7*  HGB 10.0* 10.3* 7.8*  HCT 30.9* 30.9* 23.3*  MCV 119.3 Repeated and verified X2.* 119.2* 120.2*  PLT 317.0 313 263    Cardiac Enzymes: No results for input(s): CKTOTAL, CKMB, CKMBINDEX, TROPONINI in the last 168 hours.  BNP: Invalid input(s): POCBNP  CBG: No results for input(s): GLUCAP in the last 168 hours.  Microbiology: Results for orders placed or performed during the hospital encounter of 07/22/15  C difficile quick scan w PCR reflex     Status: None   Collection Time: 07/23/15 10:42 AM  Result Value Ref Range Status   C Diff antigen NEGATIVE NEGATIVE Final   C Diff toxin NEGATIVE NEGATIVE Final   C Diff interpretation NOT NEEDED  Final    Coagulation Studies: No results for input(s): LABPROT, INR in the last 72 hours.  Urinalysis:  Recent Labs  07/23/15 1529  COLORURINE AMBER*  LABSPEC 1.013  PHURINE 6.0  GLUCOSEU NEGATIVE  HGBUR NEGATIVE  BILIRUBINUR NEGATIVE  KETONESUR NEGATIVE  PROTEINUR NEGATIVE  NITRITE NEGATIVE  LEUKOCYTESUR NEGATIVE      Imaging: US Abdomen Limited Ruq  07/24/2015  CLINICAL DATA:  Elevated liver enzymes. EXAM: US ABDOMEN LIMITED - RIGHT UPPER QUADRANT COMPARISON:  CT scan of May 11, 2015. FINDINGS: Gallbladder: No gallstones or pericholecystic fluid is noted. Mild gallbladder wall thickening is noted at 3.1 mm. No sonographic Murphy sign noted. Several echogenic foci are noted in gallbladder wall suggesting adenomyomatosis. Common bile duct: Diameter: 2.5 mm which is within normal limits. Liver: No focal lesion identified. Within normal limits in parenchymal echogenicity. Mildly dilated portal vein is noted with normal hepatopetal flow. Small amount of fluid is noted around the liver. IMPRESSION: No cholelithiasis is noted. 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 No definite cholecystitis is noted. Small amount  of free fluid is noted around the liver. No other abnormality seen. Electronically Signed   By: Marijo Conception, M.D.   On: 07/24/2015 11:25     Medications:   . sodium chloride 100 mL/hr at 07/24/15 0821   . enoxaparin (LOVENOX) injection  30 mg Subcutaneous Daily  . feeding supplement (ENSURE ENLIVE)  237 mL Oral BID WC  . folic acid  1 mg Oral Daily  . multivitamin with minerals  1 tablet Oral Daily  . thiamine  100 mg Oral Daily   Or  . thiamine  100 mg Intravenous Daily  . traZODone  50 mg Oral QHS   calcium carbonate, LORazepam **OR** LORazepam, morphine injection, ondansetron **OR** ondansetron (ZOFRAN) IV, oxyCODONE  Assessment/ Plan:  64 y.o. male with past medical history of hypertension, extensive history of nephrolithiasis, history of lithotripsy x8, and nonfunctional left kidney, left renal artery stenosis status post left renal artery stent placement in November of 2013 who returns for followup of chronic kidney disease stage III, hypertension, and left renal artery stenosis.  1. Acute renal failure/Chronic kidney disease stage III: acute renal failure due to prolonged dehydration. 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 - will check renal US, continue IVF hydration for now and follow Cr trend, avoid nephrotoxins such as contrast and NSAIDs in recovery period.  No indication for HD.    2. Hypertension.  Off all antihypertensives at this time. Monitor blood pressure.  3.  Metabolic acidosis: switch IVFs from NS to sodium bicarb gtt at 100cc/hr.    LOS: 1 Edward Singleton 10/14/201611:51 AM

## 2015-07-24 NOTE — Progress Notes (Signed)
Primary nurse spoke to Dr. Margaretmary Eddy in regard to pt requesting Nicotine Patch. 14 mg nicotine patch ordered.

## 2015-07-24 NOTE — Progress Notes (Signed)
[facility] [facility] [place] NAME: Edward Singleton    MR#:  [id number]  DATE OF BIRTH:  09-Oct-1951  SUBJECTIVE:  CHIEF COMPLAINT:  Patient is  complaining of no other episodes of diarrhea since admitted and left lower quadrant rib pain. Tolerating diet. Daughter reports ch lt kidney atrophy, pt has baseline dementia and forgetful sometimes  REVIEW OF SYSTEMS:  CONSTITUTIONAL: No fever, fatigue or weakness.  EYES: No blurred or double vision.  EARS, NOSE, AND THROAT: No tinnitus or ear pain.  RESPIRATORY: No cough, shortness of breath, wheezing or hemoptysis.  CARDIOVASCULAR: No chest pain, orthopnea, edema.  GASTROINTESTINAL: No nausea, vomiting, diarrhea .reporting left lower quadrant abdominal pain.  GENITOURINARY: No dysuria, hematuria.  ENDOCRINE: No polyuria, nocturia,  HEMATOLOGY: No anemia, easy bruising or bleeding SKIN: No rash or lesion. MUSCULOSKELETAL: No joint pain or arthritis.   NEUROLOGIC: No tingling, numbness, weakness.  PSYCHIATRY: No anxiety or depression.   DRUG ALLERGIES:  No Known Allergies  VITALS:  Blood pressure 113/57, pulse 74, temperature 97.7 F (36.5 C), temperature source Oral, resp. rate 17, height _0  (1.702 m), weight 56.7 kg (125 lb), SpO2 100 %.  PHYSICAL EXAMINATION:  GENERAL:  64 y.o.-year-old patient lying in the bed with no acute distress.  EYES: Pupils equal, round, reactive to light and accommodation. No scleral icterus. Extraocular muscles intact.  HEENT: Head atraumatic, normocephalic. Oropharynx and nasopharynx clear.  NECK:  Supple, no jugular venous distention. No thyroid enlargement, no tenderness.  LUNGS: Normal breath sounds bilaterally, no wheezing, rales,rhonchi or crepitation. No use of accessory muscles of respiration. Left  Lateral rib tenderness, reproducible CARDIOVASCULAR: S1, S2 normal. No murmurs, rubs, or gallops.  ABDOMEN: Soft, left lower quadrant is tender, no rebound  tenderness, nondistended. Bowel sounds present. No organomegaly or mass.  EXTREMITIES: No pedal edema, cyanosis, or clubbing.  NEUROLOGIC: Cranial nerves II through XII are intact. Muscle strength 5/5 in all extremities. Sensation intact. Gait not checked.  PSYCHIATRIC: The patient is alert and oriented x 3.  SKIN: No obvious rash, lesion, or ulcer.    LABORATORY PANEL:   CBC  Recent Labs Lab 07/24/15 0507  WBC 14.7*  HGB 7.8*  HCT 23.3*  PLT 263   ------------------------------------------------------------------------------------------------------------------  Chemistries   Recent Labs Lab 07/23/15 1643 07/24/15 0507  NA 135 135  K 3.3* 3.4*  CL 111 114*  CO2 17* 14*  GLUCOSE 105* 80  BUN 24* 21*  CREATININE 2.26* 1.84*  CALCIUM 8.2* 8.3*  MG  --  1.2*  AST 102*  --   ALT 43  --   ALKPHOS 512*  --   BILITOT 3.1*  --    ------------------------------------------------------------------------------------------------------------------  Cardiac Enzymes No results for input(s): TROPONINI in the last 168 hours. ------------------------------------------------------------------------------------------------------------------  RADIOLOGY:  US Renal  07/24/2015  CLINICAL DATA:  Acute renal failure. EXAM: RENAL / URINARY TRACT ULTRASOUND COMPLETE COMPARISON:  CT scan of May 11, 2015. FINDINGS: Right Kidney: Length: 11.3 cm. Echogenicity within normal limits. No mass or hydronephrosis visualized. Left Kidney: Length: 5.1 cm consistent with severe atrophy. Increased echogenicity is noted. 3 cm simple cyst arises from upper pole. No mass or hydronephrosis visualized. Bladder: Appears normal for degree of bladder distention. IMPRESSION: Severe left renal atrophy is noted. Right kidney appears grossly normal. Electronically Signed   By: Marijo Conception, M.D.   On: 07/24/2015 14:23   US Abdomen Limited Ruq  07/24/2015  CLINICAL DATA:  Elevated liver enzymes. EXAM: US  ABDOMEN  LIMITED - RIGHT UPPER QUADRANT COMPARISON:  CT scan of May 11, 2015. FINDINGS: Gallbladder: No gallstones or pericholecystic fluid is noted. Mild gallbladder wall thickening is noted at 3.1 mm. No sonographic Murphy sign noted. Several echogenic foci are noted in gallbladder wall suggesting adenomyomatosis. Common bile duct: Diameter: 2.5 mm which is within normal limits. Liver: No focal lesion identified. Within normal limits in parenchymal echogenicity. Mildly dilated portal vein is noted with normal hepatopetal flow. Small amount of fluid is noted around the liver. IMPRESSION: No cholelithiasis is noted. 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 No definite cholecystitis is noted. Small amount of free fluid is noted around the liver. No other abnormality seen. Electronically Signed   By: Marijo Conception, M.D.   On: 07/24/2015 11:25    EKG:   Orders placed or performed during the hospital encounter of 03/24/15  . ED EKG  . ED EKG    ASSESSMENT AND PLAN:   64 year old [name] with past medical history of fall with multiple fractures, hypertension, ongoing alcohol abuse, protein calorie malnutrition, BPH, hypertension, so the emergency room with  1. Acute renal failure likely secondary to prerenal azotemia from GI losses 2/2 diarrhea. Creatinine is better today Renal u/s with lt renal atrophy , which is chronic per daughter Patient reports having diarrhea on and off for last several weeks, but denies any since admission Tolerating diet IV fluids for hydration, changed to sod biacarb 2/2 met acidosis.  Monitor I's and O's.  Avoid nephrotoxins. Monitor creatinine. Baseline creatinine is 1.06 (June 2016) Appreciate nephrology recommendations   2. Elevated transaminases with elevated bilirubin in patient with history of ongoing history of alcoholism. Patient is awake and alert. Repeat CMP in a.m. We'll get an ultrasound of the abdomen with no cholecystitis, but  adenomyomatosis of gb wall   3. Hypercholesterolemia. Patient currently is not on any statins. Given his elevated LFTs L hold off on starting him on any statins. We can defer this to his primary care physician.   4. Hypertension We'll resume by mouth home meds once blood pressure more stable.  5. Generalized weakness deconditioning weight loss F/u with PT   6 severe protein calorie malnutrition/ongoing weight loss We'll follow up with  dietitian   7. Mild leukocytosis No source of infection identified. UA neg but has ca oxalate crystals   8. Elevated ammonia in the setting of patient with alcohol abuse. Patient does not have any clinically tremors. He is mentating well. Given his ongoing diarrhea and dehydration number to hold off on lactulose at present. .      All the records are reviewed and case discussed with Care Management/Social Workerr. Management plans discussed with the patient, family and they are in agreement.  CODE STATUS: Full for now until family makes decision   TOTAL TIME TAKING CARE OF THIS PATIENT: 35 minutes.   POSSIBLE D/C IN 2=3  DAYS, DEPENDING ON CLINICAL CONDITION.   Nicholes Mango M.D on 07/24/2015 at 8:11 PM  Between 7am to 6pm - Pager - [phone] After 6pm go to [email or web address] - password [email or web address] [facility]  Office  [phone]  CC: Primary care physician; Thersa Salt, DO

## 2015-07-24 NOTE — Clinical Social Work Note (Signed)
Clinical Social Work Assessment  Patient Details  Name: Edward Singleton MRN: [id number] Date of Birth: 01/28/1951  Date of referral:  07/24/15               Reason for consult:  Facility Placement                Permission sought to share information with:    Permission granted to share information::     Name::        Agency::     Relationship::     Contact Information:     Housing/Transportation Living arrangements for the past 2 months:  Single Family Home Source of Information:   (Sibling) Patient Interpreter Needed:  None Criminal Activity/Legal Involvement Pertinent to Current Situation/Hospitalization:  No - Comment as needed Significant Relationships:  Siblings Lives with:  Self Do you feel safe going back to the place where you live?    Need for family participation in patient care:     Care giving concerns:  Patient has been living alone but not taking care of himself.   Social Worker assessment / plan:  CSW received a call from Dr. Marsa Aris this morning who stated that his office along with community social work had made arrangements for patient to be placed at [facility]. CSW followed up with patient's sister: Ms. Craige Cotta: [phone]. Ms Craige Cotta confirmed that patient has arrangements to go to [facility]. CSW has also confirmed this with Broadus John at [facility] and they can take patient at discharge. Patient is currently off the unit for testing and CSW will ensure that patient is in agreement with this plan as well.  Employment status:  Unemployed Forensic scientist:  Managed Care PT Recommendations:  Not assessed at this time Information / Referral to community resources:     Patient/Family's Response to care:  Patient off unit. Patient's sister was very appreciative of CSW assistance.  Patient/Family's Understanding of and Emotional Response to Diagnosis, Current Treatment, and Prognosis:  Patient off unit.  Emotional Assessment Appearance:     Attitude/Demeanor/Rapport:    Affect (typically observed):    Orientation:  Fluctuating Orientation (Suspected and/or reported Sundowners) Alcohol / Substance use:  Alcohol Use Psych involvement (Current and /or in the community):  No (Comment)  Discharge Needs  Concerns to be addressed:  Care Coordination Readmission within the last 30 days:  No Current discharge risk:  None Barriers to Discharge:  No Barriers Identified   Shela Leff, LCSW 07/24/2015, 1:24 PM

## 2015-07-24 NOTE — Progress Notes (Signed)
Enoxaparin   Patient qualifies for Enoxaparin 40 mg SQ daily based on CrCl >30 ml/min per policy. Will change to Enoxaparin 40 mg SQ daily.  Jace Dowe D. Manreet Kiernan, PharmD   

## 2015-07-24 NOTE — Telephone Encounter (Signed)
Pt sister [name] called about if possible meeting you at the hospital today if your going. Let her know a time so she can meet you there. Sister would like to get more information about her brothers care and lab results. Thank You!

## 2015-07-24 NOTE — Progress Notes (Signed)
Primary RN spoke to Dr. Margaretmary Eddy in regard to pt diet. Orders received for Regular diet.

## 2015-07-25 LAB — COMPREHENSIVE METABOLIC PANEL
ALBUMIN: 2.2 g/dL — AB (ref 3.5–5.0)
ALK PHOS: 420 U/L — AB (ref 38–126)
ALT: 34 U/L (ref 17–63)
ANION GAP: 10 (ref 5–15)
AST: 75 U/L — ABNORMAL HIGH (ref 15–41)
BILIRUBIN TOTAL: 2.9 mg/dL — AB (ref 0.3–1.2)
BUN: 14 mg/dL (ref 6–20)
CHLORIDE: 100 mmol/L — AB (ref 101–111)
CO2: 25 mmol/L (ref 22–32)
Calcium: 8.1 mg/dL — ABNORMAL LOW (ref 8.9–10.3)
Creatinine, Ser: 1.45 mg/dL — ABNORMAL HIGH (ref 0.61–1.24)
GFR calc non Af Amer: 50 mL/min — ABNORMAL LOW (ref >60)
GFR, EST AFRICAN AMERICAN: 58 mL/min — AB (ref >60)
Glucose, Bld: 106 mg/dL — ABNORMAL HIGH (ref 65–99)
Potassium: 3.4 mmol/L — ABNORMAL LOW (ref 3.5–5.1)
SODIUM: 135 mmol/L (ref 135–145)
Total Protein: 4.7 g/dL — ABNORMAL LOW (ref 6.5–8.1)

## 2015-07-25 LAB — CREATININE, SERUM
CREATININE: 1.59 mg/dL — AB (ref 0.61–1.24)
GFR calc non Af Amer: 45 mL/min — ABNORMAL LOW (ref >60)
GFR, EST AFRICAN AMERICAN: 52 mL/min — AB (ref >60)

## 2015-07-25 LAB — POTASSIUM: Potassium: 3 mmol/L — ABNORMAL LOW (ref 3.5–5.1)

## 2015-07-25 LAB — MAGNESIUM: MAGNESIUM: 1.5 mg/dL — AB (ref 1.7–2.4)

## 2015-07-25 MED ORDER — MAGNESIUM SULFATE 4 GM/100ML IV SOLN
4.0000 g | Freq: Once | INTRAVENOUS | Status: AC
  Administered 2015-07-25: 4 g via INTRAVENOUS
  Filled 2015-07-25: qty 100

## 2015-07-25 MED ORDER — POTASSIUM CHLORIDE 20 MEQ PO PACK
40.0000 meq | PACK | Freq: Once | ORAL | Status: AC
  Administered 2015-07-25: 40 meq via ORAL
  Filled 2015-07-25: qty 2

## 2015-07-25 NOTE — Progress Notes (Signed)
[facility]  ROUNDING NOTE   Subjective:  Renal function improved.  Cr down to 1.5.  K low at 3.0.    Objective:  Vital signs in last 24 hours:  Temp:  [98.1 F (36.7 C)-98.2 F (36.8 C)] 98.2 F (36.8 C) (10/15 1303) Pulse Rate:  [80-85] 80 (10/15 1303) Resp:  [17-20] 17 (10/15 1303) BP: (128-157)/(68-83) 128/68 mmHg (10/15 1303) SpO2:  [99 %-100 %] 99 % (10/15 1303)  Weight change:  Filed Weights   07/22/15 2309  Weight: 56.7 kg (125 lb)    Intake/Output: I/O last 3 completed shifts: In: 74 [P.O.:540; I.V.:3245] Out: 800 [Urine:800]   Intake/Output this shift:  Total I/O In: 873 [P.O.:120; I.V.:753] Out: 375 [Urine:375]  Physical Exam: General: NAD, slender  Head: Normocephalic, atraumatic. Moist oral mucosal membranes  Eyes: Anicteric  Neck: Supple, trachea midline  Lungs:  Clear to auscultation normal effort  Heart: Regular rate and rhythm  Abdomen:  Soft, nontender, BS present  Extremities: no peripheral edema.  Neurologic: Nonfocal, moving all four extremities  Skin: No lesions       Basic Metabolic Panel:  Recent Labs Lab 07/22/15 1207  07/23/15 0927 07/23/15 1643 07/24/15 0507 07/24/15 2032 07/25/15 0554  NA 127*  --  133* 135 135 134*  --   K 3.8  --  3.2* 3.3* 3.4* 2.9* 3.0*  CL 99  --  109 111 114* 104  --   CO2 16*  --  15* 17* 14* 21*  --   GLUCOSE 120*  --  88 105* 80 101*  --   BUN 25*  --  26* 24* 21* 16  --   CREATININE 2.69*  < > 2.40* 2.26* 1.84* 1.84* 1.59*  CALCIUM 9.4  --  8.3* 8.2* 8.3* 8.1*  --   MG  --   --   --   --  1.2*  --  1.5*  < > = values in this interval not displayed.  Liver Function Tests:  Recent Labs Lab 07/22/15 1207 07/23/15 1643 07/24/15 2032  AST 152* 102* 80*  ALT 63* 43 38  ALKPHOS 824* 512* 433*  BILITOT 5.2* 3.1* 2.6*  PROT 6.5 5.2* 5.2*  ALBUMIN 3.4* 2.3* 2.4*   No results for input(s): LIPASE, AMYLASE in the last 168 hours.  Recent Labs Lab 07/23/15 0011  07/24/15 0506  AMMONIA 43* 54*    CBC:  Recent Labs Lab 07/22/15 1207 07/22/15 2324 07/24/15 0507  WBC 14.2* 13.9* 14.7*  HGB 10.0* 10.3* 7.8*  HCT 30.9* 30.9* 23.3*  MCV 119.3 Repeated and verified X2.* 119.2* 120.2*  PLT 317.0 313 263    Cardiac Enzymes: No results for input(s): CKTOTAL, CKMB, CKMBINDEX, TROPONINI in the last 168 hours.  BNP: Invalid input(s): POCBNP  CBG: No results for input(s): GLUCAP in the last 168 hours.  Microbiology: Results for orders placed or performed during the hospital encounter of 07/22/15  C difficile quick scan w PCR reflex     Status: None   Collection Time: 07/23/15 10:42 AM  Result Value Ref Range Status   C Diff antigen NEGATIVE NEGATIVE Final   C Diff toxin NEGATIVE NEGATIVE Final   C Diff interpretation NOT NEEDED  Final    Coagulation Studies: No results for input(s): LABPROT, INR in the last 72 hours.  Urinalysis:  Recent Labs  07/23/15 1529  COLORURINE AMBER*  LABSPEC 1.013  PHURINE 6.0  GLUCOSEU NEGATIVE  HGBUR NEGATIVE  BILIRUBINUR NEGATIVE  KETONESUR NEGATIVE  PROTEINUR  NEGATIVE  NITRITE NEGATIVE  LEUKOCYTESUR NEGATIVE      Imaging: US Renal  07/24/2015  CLINICAL DATA:  Acute renal failure. EXAM: RENAL / URINARY TRACT ULTRASOUND COMPLETE COMPARISON:  CT scan of May 11, 2015. FINDINGS: Right Kidney: Length: 11.3 cm. Echogenicity within normal limits. No mass or hydronephrosis visualized. Left Kidney: Length: 5.1 cm consistent with severe atrophy. Increased echogenicity is noted. 3 cm simple cyst arises from upper pole. No mass or hydronephrosis visualized. Bladder: Appears normal for degree of bladder distention. IMPRESSION: Severe left renal atrophy is noted. Right kidney appears grossly normal. Electronically Signed   By: Marijo Conception, M.D.   On: 07/24/2015 14:23   US Abdomen Limited Ruq  07/24/2015  CLINICAL DATA:  Elevated liver enzymes. EXAM: US ABDOMEN LIMITED - RIGHT UPPER QUADRANT COMPARISON:   CT scan of May 11, 2015. FINDINGS: Gallbladder: No gallstones or pericholecystic fluid is noted. Mild gallbladder wall thickening is noted at 3.1 mm. No sonographic Murphy sign noted. Several echogenic foci are noted in gallbladder wall suggesting adenomyomatosis. Common bile duct: Diameter: 2.5 mm which is within normal limits. Liver: No focal lesion identified. Within normal limits in parenchymal echogenicity. Mildly dilated portal vein is noted with normal hepatopetal flow. Small amount of fluid is noted around the liver. IMPRESSION: No cholelithiasis is noted. 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 No definite cholecystitis is noted. Small amount of free fluid is noted around the liver. No other abnormality seen. Electronically Signed   By: Marijo Conception, M.D.   On: 07/24/2015 11:25     Medications:   .  sodium bicarbonate 150 mEq in sterile water 1000 mL infusion 100 mL/hr at 07/25/15 1050   . enoxaparin (LOVENOX) injection  40 mg Subcutaneous Daily  . feeding supplement (ENSURE ENLIVE)  237 mL Oral BID WC  . folic acid  1 mg Oral Daily  . multivitamin with minerals  1 tablet Oral Daily  . nicotine  14 mg Transdermal Daily  . thiamine  100 mg Oral Daily   Or  . thiamine  100 mg Intravenous Daily  . traZODone  50 mg Oral QHS   calcium carbonate, LORazepam **OR** LORazepam, morphine injection, ondansetron **OR** ondansetron (ZOFRAN) IV, oxyCODONE  Assessment/ Plan:  64 y.o. male with past medical history of hypertension, extensive history of nephrolithiasis, history of lithotripsy x8, and nonfunctional left kidney, left renal artery stenosis status post left renal artery stent placement in November of 2013 who returns for followup of chronic kidney disease stage III, hypertension, and left renal artery stenosis.  1. Acute renal failure/Chronic kidney disease stage III: acute renal failure due to prolonged dehydration. 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 Has  known atrophic left kidney. -Pt has known atrophic left kidney, confirmed on renal US.  Renal function improving now on hydration, continue hydration one additional day.    2. Hypertension.  Remains normotensive off of antihypertensives, continue to monitor BP.  3.  Metabolic acidosis: decrease bicarb gtt to 50cc/hr.    LOS: 2 Irys Nigh 10/15/20162:28 PM

## 2015-07-25 NOTE — Progress Notes (Signed)
Edward Singleton at [place] NAME: Edward Singleton    MR#:  [id number]  DATE OF BIRTH:  [birth year]  SUBJECTIVE:  CHIEF COMPLAINT:  Patient is clinically feeling better. Improved left lower quadrant rib pain. Tolerating diet. Daughter reports ch lt kidney atrophy, pt has baseline vascular dementia , white matter disease and forgetful sometimes  REVIEW OF SYSTEMS:  CONSTITUTIONAL: No fever, fatigue or weakness.  EYES: No blurred or double vision.  EARS, NOSE, AND THROAT: No tinnitus or ear pain.  RESPIRATORY: No cough, shortness of breath, wheezing or hemoptysis.  CARDIOVASCULAR: No chest pain, orthopnea, edema.  GASTROINTESTINAL: No nausea, vomiting, diarrhea .reporting left lower quadrant abdominal pain.  GENITOURINARY: No dysuria, hematuria.  ENDOCRINE: No polyuria, nocturia,  HEMATOLOGY: No anemia, easy bruising or bleeding SKIN: No rash or lesion. MUSCULOSKELETAL: No joint pain or arthritis.   NEUROLOGIC: No tingling, numbness, weakness.  PSYCHIATRY: No anxiety or depression.   DRUG ALLERGIES:  No Known Allergies  VITALS:  Blood pressure 128/68, pulse 80, temperature 98.2 F (36.8 C), temperature source Oral, resp. rate 17, height _0  (1.702 m), weight 56.7 kg (125 lb), SpO2 99 %.  PHYSICAL EXAMINATION:  GENERAL:  64 y.o.-year-old patient lying in the bed with no acute distress.  EYES: Pupils equal, round, reactive to light and accommodation. No scleral icterus. Extraocular muscles intact.  HEENT: Head atraumatic, normocephalic. Oropharynx and nasopharynx clear.  NECK:  Supple, no jugular venous distention. No thyroid enlargement, no tenderness.  LUNGS: Normal breath sounds bilaterally, no wheezing, rales,rhonchi or crepitation. No use of accessory muscles of respiration. Left  Lateral rib tenderness, reproducible CARDIOVASCULAR: S1, S2 normal. No murmurs, rubs, or gallops.  ABDOMEN: Soft, left lower quadrant is tender, no rebound  tenderness, nondistended. Bowel sounds present. No organomegaly or mass.  EXTREMITIES: No pedal edema, cyanosis, or clubbing.  NEUROLOGIC: Cranial nerves II through XII are intact. Muscle strength 5/5 in all extremities. Sensation intact. Gait not checked.  PSYCHIATRIC: The patient is alert and oriented x 3.  SKIN: No obvious rash, lesion, or ulcer.    LABORATORY PANEL:   CBC  Recent Labs Lab 07/24/15 0507  WBC 14.7*  HGB 7.8*  HCT 23.3*  PLT 263   ------------------------------------------------------------------------------------------------------------------  Chemistries   Recent Labs Lab 07/24/15 2032 07/25/15 0554  NA 134*  --   K 2.9* 3.0*  CL 104  --   CO2 21*  --   GLUCOSE 101*  --   BUN 16  --   CREATININE 1.84* 1.59*  CALCIUM 8.1*  --   MG  --  1.5*  AST 80*  --   ALT 38  --   ALKPHOS 433*  --   BILITOT 2.6*  --    ------------------------------------------------------------------------------------------------------------------  Cardiac Enzymes No results for input(s): TROPONINI in the last 168 hours. ------------------------------------------------------------------------------------------------------------------  RADIOLOGY:  US Renal  07/24/2015  CLINICAL DATA:  Acute renal failure. EXAM: RENAL / URINARY TRACT ULTRASOUND COMPLETE COMPARISON:  CT scan of May 11, 2015. FINDINGS: Right Kidney: Length: 11.3 cm. Echogenicity within normal limits. No mass or hydronephrosis visualized. Left Kidney: Length: 5.1 cm consistent with severe atrophy. Increased echogenicity is noted. 3 cm simple cyst arises from upper pole. No mass or hydronephrosis visualized. Bladder: Appears normal for degree of bladder distention. IMPRESSION: Severe left renal atrophy is noted. Right kidney appears grossly normal. Electronically Signed   By: Marijo Conception, M.D.   On: 07/24/2015 14:23   US Abdomen Limited Ruq  07/24/2015  CLINICAL DATA:  Elevated liver enzymes. EXAM: US  ABDOMEN LIMITED - RIGHT UPPER QUADRANT COMPARISON:  CT scan of May 11, 2015. FINDINGS: Gallbladder: No gallstones or pericholecystic fluid is noted. Mild gallbladder wall thickening is noted at 3.1 mm. No sonographic Murphy sign noted. Several echogenic foci are noted in gallbladder wall suggesting adenomyomatosis. Common bile duct: Diameter: 2.5 mm which is within normal limits. Liver: No focal lesion identified. Within normal limits in parenchymal echogenicity. Mildly dilated portal vein is noted with normal hepatopetal flow. Small amount of fluid is noted around the liver. IMPRESSION: No cholelithiasis is noted. 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 No definite cholecystitis is noted. Small amount of free fluid is noted around the liver. No other abnormality seen. Electronically Signed   By: Marijo Conception, M.D.   On: 07/24/2015 11:25    EKG:   Orders placed or performed during the hospital encounter of 03/24/15  . ED EKG  . ED EKG    ASSESSMENT AND PLAN:   64 year old Edward Singleton with past medical history of fall with multiple fractures, hypertension, ongoing alcohol abuse, protein calorie malnutrition, BPH, hypertension, so the emergency room with  1. Acute renal failure likely secondary to prerenal azotemia from GI losses 2/2 diarrhea. Creatinine is better 1.59 Renal u/s with lt renal atrophy , which is chronic per daughter Patient reports having diarrhea on and off for last several weeks, but denies any since admission Tolerating diet IV fluids for hydration, changed to sod biacarb 2/2 met acidosis.  Monitor I's and O's.  Avoid nephrotoxins. Monitor creatinine. Baseline creatinine is 1.06 (June 2016), today's creatinine is at 1.59 Anticipated discharge in a.m. if creatinine is improving  Appreciate nephrology recommendations   2. Elevated transaminases with elevated bilirubin in patient with history of ongoing history of alcoholism. Patient is awake and  alert. Repeat CMP in a.m. We'll get an ultrasound of the abdomen with no cholecystitis, but adenomyomatosis of gb wall Needs outpatient alcohol rehabilitation Replace potassium and magnesium and check labs in a.m.  3. Hypercholesterolemia. Patient currently is not on any statins. Given his elevated LFTs L hold off on starting him on any statins. We can defer this to his primary care physician.   4. Hypertension We'll resume by mouth home meds once blood pressure more stable.  5. Generalized weakness deconditioning weight loss F/u with PT   6 severe protein calorie malnutrition/ongoing weight loss Seen by dietitian continue dietary supplements with ensure   7. Mild leukocytosis No source of infection identified. UA neg but has ca oxalate crystals , patient is asymptomatic  8. Elevated ammonia in the setting of patient with alcohol abuse. Patient does not have any clinically tremors. He is mentating well. Given his ongoing diarrhea and dehydration number to hold off on lactulose at present. .    Disposition-[facility]  All the records are reviewed and case discussed with Care Management/Social Workerr. Management plans discussed with the patient, family and they are in agreement.  CODE STATUS: Full    TOTAL TIME TAKING CARE OF THIS PATIENT: 35 minutes.   POSSIBLE D/C IN 2=3  DAYS, DEPENDING ON CLINICAL CONDITION.   Nicholes Mango M.D on 07/25/2015 at 1:25 PM  Between 7am to 6pm - Pager - [phone] After 6pm go to [email or web address] - password [email or web address] [facility]  Office  [phone]  CC: Primary care physician; Thersa Salt, DO

## 2015-07-26 LAB — MAGNESIUM: MAGNESIUM: 1.8 mg/dL (ref 1.7–2.4)

## 2015-07-26 NOTE — Progress Notes (Signed)
[facility]  ROUNDING NOTE   Subjective:  Pt had repeat labs yesterday. Cr was down to 1.45.   Overall feeling better as compared to admission.   Objective:  Vital signs in last 24 hours:  Temp:  [97.9 F (36.6 C)-98.1 F (36.7 C)] 97.9 F (36.6 C) (10/16 1234) Pulse Rate:  [77-93] 84 (10/16 1234) Resp:  [19-20] 19 (10/16 1234) BP: (119-143)/(73-75) 119/73 mmHg (10/16 1234) SpO2:  [94 %-99 %] 99 % (10/16 1234)  Weight change:  Filed Weights   07/22/15 2309  Weight: 56.7 kg (125 lb)    Intake/Output: I/O last 3 completed shifts: In: 3671.7 [P.O.:1020; I.V.:2651.7] Out: 1275 [Urine:1275]   Intake/Output this shift:  Total I/O In: 514 [P.O.:100; I.V.:414] Out: 100 [Urine:100]  Physical Exam: General: NAD, slender  Head: Normocephalic, atraumatic. Moist oral mucosal membranes  Eyes: Anicteric  Neck: Supple, trachea midline  Lungs:  Clear to auscultation normal effort  Heart: Regular rate and rhythm  Abdomen:  Soft, nontender, BS present  Extremities: no peripheral edema.  Neurologic: Nonfocal, moving all four extremities  Skin: No lesions       Basic Metabolic Panel:  Recent Labs Lab 07/23/15 0927 07/23/15 1643 07/24/15 0507 07/24/15 2032 07/25/15 0554 07/25/15 1350 07/26/15 0606  NA 133* 135 135 134*  --  135  --   K 3.2* 3.3* 3.4* 2.9* 3.0* 3.4*  --   CL 109 111 114* 104  --  100*  --   CO2 15* 17* 14* 21*  --  25  --   GLUCOSE 88 105* 80 101*  --  106*  --   BUN 26* 24* 21* 16  --  14  --   CREATININE 2.40* 2.26* 1.84* 1.84* 1.59* 1.45*  --   CALCIUM 8.3* 8.2* 8.3* 8.1*  --  8.1*  --   MG  --   --  1.2*  --  1.5*  --  1.8    Liver Function Tests:  Recent Labs Lab 07/22/15 1207 07/23/15 1643 07/24/15 2032 07/25/15 1350  AST 152* 102* 80* 75*  ALT 63* 43 38 34  ALKPHOS 824* 512* 433* 420*  BILITOT 5.2* 3.1* 2.6* 2.9*  PROT 6.5 5.2* 5.2* 4.7*  ALBUMIN 3.4* 2.3* 2.4* 2.2*   No results for input(s): LIPASE, AMYLASE in the  last 168 hours.  Recent Labs Lab 07/23/15 0011 07/24/15 0506  AMMONIA 43* 54*    CBC:  Recent Labs Lab 07/22/15 1207 07/22/15 2324 07/24/15 0507  WBC 14.2* 13.9* 14.7*  HGB 10.0* 10.3* 7.8*  HCT 30.9* 30.9* 23.3*  MCV 119.3 Repeated and verified X2.* 119.2* 120.2*  PLT 317.0 313 263    Cardiac Enzymes: No results for input(s): CKTOTAL, CKMB, CKMBINDEX, TROPONINI in the last 168 hours.  BNP: Invalid input(s): POCBNP  CBG: No results for input(s): GLUCAP in the last 168 hours.  Microbiology: Results for orders placed or performed during the hospital encounter of 07/22/15  C difficile quick scan w PCR reflex     Status: None   Collection Time: 07/23/15 10:42 AM  Result Value Ref Range Status   C Diff antigen NEGATIVE NEGATIVE Final   C Diff toxin NEGATIVE NEGATIVE Final   C Diff interpretation NOT NEEDED  Final    Coagulation Studies: No results for input(s): LABPROT, INR in the last 72 hours.  Urinalysis:  Recent Labs  07/23/15 1529  COLORURINE AMBER*  LABSPEC 1.013  PHURINE 6.0  GLUCOSEU NEGATIVE  HGBUR NEGATIVE  BILIRUBINUR NEGATIVE  KETONESUR NEGATIVE  PROTEINUR NEGATIVE  NITRITE NEGATIVE  LEUKOCYTESUR NEGATIVE      Imaging: US Renal  07/24/2015  CLINICAL DATA:  Acute renal failure. EXAM: RENAL / URINARY TRACT ULTRASOUND COMPLETE COMPARISON:  CT scan of May 11, 2015. FINDINGS: Right Kidney: Length: 11.3 cm. Echogenicity within normal limits. No mass or hydronephrosis visualized. Left Kidney: Length: 5.1 cm consistent with severe atrophy. Increased echogenicity is noted. 3 cm simple cyst arises from upper pole. No mass or hydronephrosis visualized. Bladder: Appears normal for degree of bladder distention. IMPRESSION: Severe left renal atrophy is noted. Right kidney appears grossly normal. Electronically Signed   By: Marijo Conception, M.D.   On: 07/24/2015 14:23     Medications:   .  sodium bicarbonate 150 mEq in sterile water 1000 mL infusion  50 mL/hr at 07/26/15 0559   . enoxaparin (LOVENOX) injection  40 mg Subcutaneous Daily  . feeding supplement (ENSURE ENLIVE)  237 mL Oral BID WC  . folic acid  1 mg Oral Daily  . multivitamin with minerals  1 tablet Oral Daily  . nicotine  14 mg Transdermal Daily  . thiamine  100 mg Oral Daily   Or  . thiamine  100 mg Intravenous Daily  . traZODone  50 mg Oral QHS   calcium carbonate, morphine injection, ondansetron **OR** ondansetron (ZOFRAN) IV, oxyCODONE  Assessment/ Plan:  64 y.o. male with past medical history of hypertension, extensive history of nephrolithiasis, history of lithotripsy x8, and nonfunctional left kidney, left renal artery stenosis status post left renal artery stent placement in November of 2013 who returns for followup of chronic kidney disease stage III, hypertension, and left renal artery stenosis.  1. Acute renal failure/Chronic kidney disease stage III: acute renal failure due to prolonged dehydration. 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 Has known atrophic left kidney. -Cr close to baseline at 1.45.  Will stop IVFs now.  Advised pt that he will need follow up for his underlying CKD.    2. Hypertension.  BP 119/73, off all antihypertensives.    3.  Metabolic acidosis: resolved, serum bicarb up to 25 today, will stop bicarb gtt now.  Pt appears to be eating and drinking well.    LOS: 3 Sundance Moise 10/16/20161:42 PM

## 2015-07-26 NOTE — Progress Notes (Signed)
Per Dr. Margaretmary Eddy place order for psych consult

## 2015-07-27 ENCOUNTER — Telehealth: Payer: Self-pay | Admitting: *Deleted

## 2015-07-27 ENCOUNTER — Telehealth: Payer: Self-pay | Admitting: Family Medicine

## 2015-07-27 DIAGNOSIS — F1097 Alcohol use, unspecified with alcohol-induced persisting dementia: Secondary | ICD-10-CM

## 2015-07-27 LAB — BASIC METABOLIC PANEL
ANION GAP: 8 (ref 5–15)
BUN: 13 mg/dL (ref 6–20)
CALCIUM: 8.5 mg/dL — AB (ref 8.9–10.3)
CO2: 32 mmol/L (ref 22–32)
Chloride: 95 mmol/L — ABNORMAL LOW (ref 101–111)
Creatinine, Ser: 1.6 mg/dL — ABNORMAL HIGH (ref 0.61–1.24)
GFR, EST AFRICAN AMERICAN: 51 mL/min — AB (ref >60)
GFR, EST NON AFRICAN AMERICAN: 44 mL/min — AB (ref >60)
GLUCOSE: 125 mg/dL — AB (ref 65–99)
POTASSIUM: 3.4 mmol/L — AB (ref 3.5–5.1)
SODIUM: 135 mmol/L (ref 135–145)

## 2015-07-27 MED ORDER — POTASSIUM CHLORIDE 20 MEQ PO PACK: 40.0000 meq | PACK | Freq: Once | ORAL | Status: DC

## 2015-07-27 MED ORDER — FOLIC ACID 1 MG PO TABS: 1.0000 mg | ORAL_TABLET | Freq: Every day | ORAL | Status: DC

## 2015-07-27 MED ORDER — ENOXAPARIN SODIUM 30 MG/0.3ML ~~LOC~~ SOLN: 30.0000 mg | Freq: Every day | SUBCUTANEOUS | Status: DC

## 2015-07-27 MED ORDER — ENSURE ENLIVE PO LIQD: 237.0000 mL | Freq: Two times a day (BID) | ORAL | Status: DC

## 2015-07-27 MED ORDER — THIAMINE HCL 100 MG PO TABS: 100.0000 mg | ORAL_TABLET | Freq: Every day | ORAL | Status: DC

## 2015-07-27 MED ORDER — NICOTINE 14 MG/24HR TD PT24: 14.0000 mg | MEDICATED_PATCH | Freq: Every day | TRANSDERMAL | Status: DC

## 2015-07-27 NOTE — Progress Notes (Signed)
Clinical Education officer, museum (CSW) contacted patient's [place]. Per Morey Hummingbird she will assist patient and sister [name] with applying for Medicaid and ALF placement. CSW explained to [place] that patient does not meet criteria for SNF at this time and cannot be placed from the hospital. Morey Hummingbird verbalized her understanding. CSW will continue to follow and assist as needed.   Blima Rich, [place] [phone]

## 2015-07-27 NOTE — Progress Notes (Signed)
Clinical Education officer, museum (CSW) contacted patient's sister Maricela Curet. CSW explained that patient is walking 400 feet with PT and does not meet criteria for a skilled nursing facility admission. CSW explained that patient's insurance will not pay for short term rehab at this time. CSW contacted [facility] and discussed case. Per Broadus John patient cannot be admitted to [facility] at this time due to not meeting criteria. Sister expressed concerns about patient's dementia and alcohol abuse. Sister reported that patient needs 24 hour care. CSW explained that patient is custodial care and at baseline and is doing too well to go to rehab under his insurance. CSW asked sister if patient can pay privately for SNF or ALF. Sister reported that patient cannot pay privately for facility placement. CSW explained that patient will have to apply for long term care Medicaid and look into Assisted Living level of care. CSW provided emotional support to sister. Per Sister she would like to be notified first when patient is ready for D/C. Plan is for patient to return home. RN aware of above. CSW will continue to follow and assist as needed.   Blima Rich, [place] [phone]

## 2015-07-27 NOTE — Consult Note (Signed)
[facility] Face-to-Face Psychiatry Consult   Reason for Consult:  Consult for this 64 year old man with multiple medical problems. 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 Acute renal failure (ARF) ([place]) [N17.9] 07/23/2015  . Insomnia [G47.00] 07/22/2015  . Diarrhea [R19.7] 07/22/2015  . ARF (acute renal failure) ([place]) [N17.9] 07/22/2015  . Loss of weight [R63.4] 07/22/2015  . Skin lesion [L98.9] 07/22/2015  . History of anemia [Z86.2] 07/15/2015  . History of substance abuse [Z87.898] 07/15/2015  . BPH (benign prostatic hyperplasia) [N40.0] 07/15/2015  . Gout [M10.9] 07/15/2015  . Coronary artery calcification [I25.10, I25.84] 07/15/2015  . Kidney stones [N20.0] 05/11/2015  . 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 Protein-calorie malnutrition, severe ([place]) [E43] 11/19/2014  . Fall with multiple fractures [W19.XXXA] 11/17/2014  . HTN (hypertension) [I10] 11/17/2014  . Alcohol abuse [F10.10] 11/17/2014  . Dyslipidemia [E78.5] 11/17/2014  . History of nonadherence to medical treatment [Z91.19] 11/17/2014    Total Time spent with patient: 1 hour  Subjective:   Edward Singleton is a 64 y.o. male patient admitted with "I really don't know".  HPI:  Information from the patient and the chart and conversation with nursing staff and social work Biochemist, clinical. Patient was interviewed in his room. Chart was reviewed including old notes and current hospitalization. Labs reviewed. Case discussed with social work and nursing. This 64 year old man was currently brought to the hospital for acute renal failure. Evidently he was quite debilitated when he first came into the hospital but has made a dramatic improvement. Consideration had been given to trying to have him  go to a rehabilitation facility earlier in his hospital stay but he no longer meets qualifications for that. The consult was originally requested because he was reported to be declining the referral to rehabilitation in his capacity was at issue. The updated report is that rehabilitation is no longer being offered which makes the acute issue moot. Patient himself has no new complaint. He tells me that he knows that he's been in the hospital but he does not know why he is in the hospital. He says he has no idea what has been done for him or what problems of been addressed in the hospital. He assumes that he was given medication at some point but doesn't know why. Tells me that as far as he knows his only medical problem is kidney stones. He then clarifies that he also takes medicine for high blood pressure. Patient denies any mood symptoms. Not reporting any acute psychiatric complaints. Patient claims that he stopped drinking as of a couple months ago.  Past psychiatric history: As far as I can tell from the notes he's never been seen by a psychiatrist or mental health provider. It looks like he's been treated for alcohol withdrawal on a couple of occasions. He can't give much insight into that. It was documented within the last year that he was still drinking had a fairly heavy rate but he tells me today that he has not had any alcohol in a couple months. No history of suicide attempts reported. No other psychiatric treatment. It's been documented that he has a diagnosis of dementia related to alcohol abuse but I'm not sure how much has been quantified.  Social history: Patient lives by himself. Evidently  that's part of the issue that his sister who is his power of attorney wants him to not live independently. An opinion was evidently expressed by his doctor at some point that he requires "24-hour care". 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  Family history: He says that he had a brother who had a heroin dependence problem but doesn't know of any other mental health or substance abuse problems in his family.  Medical history: Multiple medical problems including the acute renal failure, multiple fractures from falls, high blood pressure, history of coronary artery disease, history of malnutrition history of benign prosthetic hypertrophy, history of gout    Past Psychiatric History: Evidently never been seen by a psychiatrist no history of psychiatric hospitalization or known history of suicide attempts. Does have a history of alcohol abuse it's been treated mostly medically. He tells me that he has been to an alcohol detox at some point but he has no idea where or when or remember any details about it  Risk to Self: Is patient at risk for suicide?: No Risk to Others:   Prior Inpatient Therapy:   Prior Outpatient Therapy:    Past Medical History:  Past Medical History  Diagnosis Date  . Hypertension   . Hypercholesteremia   . Gout   . Pelvic fracture ([place]) 11/17/2014  . Benign enlargement of prostate   . Kidney stones   . Atrophic kidney   . Left flank pain, chronic   . 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 Alcohol abuse     Past Surgical History  Procedure Laterality Date  . Orif humerus fracture Left 11/18/2014    Procedure: OPEN REDUCTION INTERNAL FIXATION (ORIF) DISTAL HUMERUS FRACTURE;  Surgeon: Rozanna Box, MD;  Location: [facility];  Service: Orthopedics;  Laterality: Left;  . Lithotripsy    . Left renal stent placement  2013  . Splenectomy     Family History:  Family History  Problem Relation Age of Onset  . Nephrolithiasis Paternal Grandfather   . Kidney disease Neg Hx   . Prostate cancer Neg Hx   . Heart disease Mother   . Heart disease Father   . Diabetes Father    Family Psychiatric  History: As above he says he had a brother with opiate dependence no other family  history of mental health illness Social History:  History  Alcohol Use  . 25.2 oz/week  . 42 Cans of beer, 0 Standard drinks or equivalent per week     History  Drug Use No    Social History   Social History  . Marital Status: Single    Spouse Name: N/A  . Number of Children: N/A  . Years of Education: N/A   Social History Main Topics  . Smoking status: Current Every Day Smoker -- 1.00 packs/day for .5 years    Types: Cigarettes  . Smokeless tobacco: Never Used  . Alcohol Use: 25.2 oz/week    42 Cans of beer, 0 Standard drinks or equivalent per week  . Drug Use: No  . Sexual Activity: Not Asked   Other Topics Concern  . None   Social History Narrative   Additional Social History:                          Allergies:  No Known Allergies  Labs:  Results for orders placed or performed during the hospital encounter of 07/22/15 (from the past 48 hour(s))  Magnesium     Status: None   Collection Time: 07/26/15  6:06 AM  Result Value Ref Range   Magnesium 1.8 1.7 - 2.4 mg/dL  Basic metabolic panel     Status: Abnormal   Collection Time: 07/27/15  9:38 AM  Result Value Ref Range   Sodium 135 135 - 145 mmol/L   Potassium 3.4 (L) 3.5 - 5.1 mmol/L   Chloride 95 (L) 101 - 111 mmol/L   CO2 32 22 - 32 mmol/L   Glucose, Bld 125 (H) 65 - 99 mg/dL   BUN 13 6 - 20 mg/dL   Creatinine, Ser 1.60 (H) 0.61 - 1.24 mg/dL   Calcium 8.5 (L) 8.9 - 10.3 mg/dL   GFR calc non Af Amer 44 (L) >60 mL/min   GFR calc Af Amer 51 (L) >60 mL/min    Comment: (NOTE) The eGFR has been calculated using the CKD EPI equation. This calculation has not been validated in all clinical situations. eGFR's persistently <60 mL/min signify possible Chronic Kidney Disease.    Anion gap 8 5 - 15    Current Facility-Administered Medications  Medication Dose Route Frequency Provider Last Rate Last Dose  . calcium carbonate (TUMS - dosed in mg elemental calcium) chewable tablet 400 mg of elemental  calcium  2 tablet Oral QID PRN Gregor Hams, MD   400 mg of elemental calcium at 07/26/15 2256  . [START ON 07/28/2015] enoxaparin (LOVENOX) injection 30 mg  30 mg Subcutaneous Daily Aruna Gouru, MD      . feeding supplement (ENSURE ENLIVE) (ENSURE ENLIVE) liquid 237 mL  237 mL Oral BID WC Nicholes Mango, MD   237 mL at 07/27/15 0942  . folic acid (FOLVITE) tablet 1 mg  1 mg Oral Daily Fritzi Mandes, MD   1 mg at 07/27/15 0940  . morphine 2 MG/ML injection 1 mg  1 mg Intravenous Q4H PRN Fritzi Mandes, MD   1 mg at 07/26/15 1036  . multivitamin with minerals tablet 1 tablet  1 tablet Oral Daily Fritzi Mandes, MD   1 tablet at 07/27/15 0940  . nicotine (NICODERM CQ - dosed in mg/24 hours) patch 14 mg  14 mg Transdermal Daily Nicholes Mango, MD   14 mg at 07/27/15 0940  . ondansetron (ZOFRAN) tablet 4 mg  4 mg Oral Q6H PRN Fritzi Mandes, MD       Or  . ondansetron (ZOFRAN) injection 4 mg  4 mg Intravenous Q6H PRN Fritzi Mandes, MD   4 mg at 07/25/15 1405  . oxyCODONE (Oxy IR/ROXICODONE) immediate release tablet 5 mg  5 mg Oral Q4H PRN Fritzi Mandes, MD   5 mg at 07/27/15 1146  . potassium chloride (KLOR-CON) packet 40 mEq  40 mEq Oral Once Nicholes Mango, MD      . thiamine (VITAMIN B-1) tablet 100 mg  100 mg Oral Daily Fritzi Mandes, MD   100 mg at 07/27/15 0940   Or  . thiamine (B-1) injection 100 mg  100 mg Intravenous Daily Fritzi Mandes, MD      . traZODone (DESYREL) tablet 50 mg  50 mg Oral QHS Fritzi Mandes, MD   50 mg at 07/26/15 2145    Musculoskeletal: Strength & Muscle Tone: atrophy Gait & Station: normal Patient leans: N/A  Psychiatric Specialty Exam: Review of Systems  Constitutional: Negative.   HENT:  Negative.   Eyes: Negative.   Respiratory: Negative.   Cardiovascular: Negative.   Gastrointestinal: Negative.   Musculoskeletal: Negative.   Skin: Negative.   Neurological: Negative.   Psychiatric/Behavioral: Positive for memory loss. Negative for depression, suicidal ideas, hallucinations and substance  abuse. The patient is not nervous/anxious and does not have insomnia.     Blood pressure 122/68, pulse 79, temperature 98.3 F (36.8 C), temperature source Oral, resp. rate 20, height _0  (1.702 m), weight 56.7 kg (125 lb), SpO2 98 %.Body mass index is 19.57 kg/(m^2).  General Appearance: Casual  Eye Contact::  Good  Speech:  Clear and Coherent  Volume:  Decreased  Mood:  Euthymic  Affect:  Congruent  Thought Process:  Goal Directed  Orientation:  Full (Time, Place, and Person)  Thought Content:  Negative  Suicidal Thoughts:  No  Homicidal Thoughts:  No  Memory:  Immediate;   Fair Recent;   Poor Remote;   Poor  Judgement:  Impaired  Insight:  Lacking  Psychomotor Activity:  Decreased  Concentration:  Fair  Recall:  Poor  Fund of Knowledge:Fair  Language: Fair  Akathisia:  No  Handed:  Right  AIMS (if indicated):     Assets:  Communication Skills Housing Social Support  ADL's:  Intact  Cognition: Impaired,  Mild  Sleep:      Treatment Plan Summary: Plan This is a 64 year old man with a history of alcohol abuse. It has been noted in the past that he has dementia related to alcohol abuse. On my examination today the patient denies any acute psychiatric symptoms. He claims that he is doing a perfectly fine job taking care of himself at home. He admits that he no longer is going to be driving or working going forward but says he thinks he can find ways to get around that. On Mini-Mental status exam the patient scores a 25 out of 30. Mostly he is intact but he clearly has memory problems. He can only remember 1 out of 3 objects. The fact that he has no idea why he is in the hospital or what has been done to him in the hospital and that he can't remember any of his medical problems except for his history of gout high blood pressure is fairly remarkable given his extensive history. 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 that he is a condition that may vary from situation to situation. Without there being an acute decision about where he is going to live Comment on his capacity to make decisions however I can say that he gives every evidence of being irresponsible and unknowledgeable about his medical problems and substance abuse problems. Has no insight. Seems to have no intention in trying to make any further changes for his own benefit. Nevertheless I can't say that it's obvious that he needs "24-hour care". Patient is certainly at risk for continued falls particularly if he continues to drink or is noncompliant with medication. Without close monitoring his risk of injury is higher than it would be if he were more intact as far as his memory. More than that however at this point I can't say. He only really has a mild degree of dementia although with specific elements of  loss in the memory arena. No indication for any psychiatric medication or referral for specific outpatient treatment.  Disposition: No evidence of imminent risk to self or others at present.   Patient does not meet criteria for psychiatric inpatient admission.  Jaslin Novitski 07/27/2015 4:28 PM

## 2015-07-27 NOTE — Telephone Encounter (Signed)
Patient will D/C from the hospital today. He is scheduled to be seen by Dr. Lacinda Axon on Thursday 10/20 At 9am.

## 2015-07-27 NOTE — Discharge Summary (Signed)
[facility] [place] NAME: Edward Singleton    MR#:  [id number]  DATE OF BIRTH:  [birth year]  DATE OF ADMISSION:  07/22/2015 ADMITTING PHYSICIAN: Fritzi Mandes, MD  DATE OF DISCHARGE: 07/27/2015  PRIMARY CARE PHYSICIAN: Thersa Salt, DO    ADMISSION DIAGNOSIS:  Jaundice [R17] Elevated liver enzymes [R74.8] Acute renal failure, unspecified acute renal failure type ([place]) [N17.9]  DISCHARGE DIAGNOSIS:  Active Problems:   Acute renal failure (ARF) (HCC) alcohol abuse   SECONDARY DIAGNOSIS:   Past Medical History  Diagnosis Date  . Hypertension   . Hypercholesteremia   . Gout   . Pelvic fracture ([place]) 11/17/2014  . Benign enlargement of prostate   . Kidney stones   . Atrophic kidney   . Left flank pain, chronic   . 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 Alcohol abuse     HOSPITAL COURSE:   64 year old [name] with past medical history of fall with multiple fractures, hypertension, ongoing alcohol abuse, protein calorie malnutrition, BPH, hypertension, so the emergency room with  1. Acute renal failure likely secondary to prerenal azotemia from GI losses 2/2 diarrhea. Creatinine is better  Renal u/s with lt renal atrophy , which is chronic per daughter Patient's diarrhea is resolved Avoid nephrotoxins. Baseline creatinine is 1.06 (June 2016), pt is to f/u with nephro as as op     2. Elevated transaminases with elevated bilirubin in patient with history of ongoing history of alcoholism. Patient is awake and aler ultrasound of the abtdomen with no cholecystitis, but adenomyomatosis of gb wall Needs outpatient alcohol rehabilitation pcp to consider rpt LFT'S during f/u visit in 2-3 weeks  3. Hypercholesterolemia. Patient currently is not on any statins. Given his elevated LFTs L hold off on starting him on any statins. We will defer this to his primary care physician.   4. Hypertension We'll resume by mouth home meds once  blood pressure more stable.  5. Generalized weakness deconditioning weight loss PT recommended no PT follow up  6 severe protein calorie malnutrition/ongoing weight loss Seen by dietitian continue dietary supplements with ensure   7. Mild leukocytosis No source of infection identified. UA neg but has ca oxalate crystals , patient is asymptomatic  .8. Disposition-based on psych evaluation As there is a concern for safety issues per family , psych consult is placed .  Sister is requesting to place him in a facility as he has chronic dementia and white matter disease . She is concerned that he might not be able to take care of himeself as he is forgetful. Pt is refusing placement and wants to go home  Addendum : pt is seen by psychiatry and cleared to be d/ced home . Please see dr.Clapacs notes. Sisters will assist his care after d/c .cc   DISCHARGE CONDITIONS:   fair  CONSULTS OBTAINED:  Treatment Team:  Nicholes Mango, MD Munsoor Holley Raring, MD Donita Brooks, MD   PROCEDURES none   DRUG ALLERGIES:  No Known Allergies  DISCHARGE MEDICATIONS:   Current Discharge Medication List    START taking these medications   Details  feeding supplement, ENSURE ENLIVE, (ENSURE ENLIVE) LIQD Take 237 mLs by mouth 2 (two) times daily with a meal. Qty: 237 mL, Refills: 12    folic acid (FOLVITE) 1 MG tablet Take 1 tablet (1 mg total) by mouth daily. Qty: 30 tablet, Refills: 0    nicotine (NICODERM CQ - DOSED IN MG/24 HOURS)  14 mg/24hr patch Place 1 patch (14 mg total) onto the skin daily. Qty: 28 patch, Refills: 0    thiamine 100 MG tablet Take 1 tablet (100 mg total) by mouth daily. Qty: 30 tablet, Refills: 0      CONTINUE these medications which have NOT CHANGED   Details  allopurinol (ZYLOPRIM) 300 MG tablet Take 0.5 tablets (150 mg total) by mouth daily. Qty: 30 tablet, Refills: 4   Associated Diagnoses: Closed fracture of left distal humerus, sequela; Alcohol abuse; Insomnia     Multiple Vitamin (MULTIVITAMIN) tablet Take 1 tablet by mouth daily. Qty: 30 tablet, Refills: 4   Associated Diagnoses: Alcohol abuse    traZODone (DESYREL) 50 MG tablet Take 1 tablet (50 mg total) by mouth at bedtime. Qty: 30 tablet, Refills: 4   Associated Diagnoses: Insomnia; Alcohol abuse      STOP taking these medications     metoprolol tartrate (LOPRESSOR) 25 MG tablet          DISCHARGE INSTRUCTIONS:   Activity as tolrated per PT recommendations  Diet - low salt , supplements F/u with PCP and nephrology as recommended   DIET:  Low salt and dietary supplements  DISCHARGE CONDITION:  fair  ACTIVITY:  As tolerated  OXYGEN:  Home Oxygen: no   Oxygen Delivery: RA DISCHARGE LOCATION:  HOME to sisters care  If you experience worsening of your admission symptoms, develop shortness of breath, life threatening emergency, suicidal or homicidal thoughts you must seek medical attention immediately by calling 911 or calling your MD immediately  if symptoms less severe.  You Must read complete instructions/literature along with all the possible adverse reactions/side effects for all the Medicines you take and that have been prescribed to you. Take any new Medicines after you have completely understood and accpet all the possible adverse reactions/side effects.   Please note  You were cared for by a hospitalist during your hospital stay. If you have any questions about your discharge medications or the care you received while you were in the hospital after you are discharged, you can call the unit and asked to speak with the hospitalist on call if the hospitalist that took care of you is not available. Once you are discharged, your primary care physician will handle any further medical issues. Please note that NO REFILLS for any discharge medications will be authorized once you are discharged, as it is imperative that you return to your primary care physician (or establish a  relationship with a primary care physician if you do not have one) for your aftercare needs so that they can reassess your need for medications and monitor your lab values.     Today  Chief Complaint  Patient presents with  . Abnormal Lab   Pt feels fine and wants to be d/ced home after psychitry evaluation  ROS:  CONSTITUTIONAL: Denies fevers, chills. Denies any fatigue, weakness.  EYES: Denies blurry vision, double vision, eye pain. EARS, NOSE, THROAT: Denies tinnitus, ear pain, hearing loss. RESPIRATORY: Denies cough, wheeze, shortness of breath.  CARDIOVASCULAR: Denies chest pain, palpitations, edema.  GASTROINTESTINAL: Denies nausea, vomiting, diarrhea, abdominal pain. Denies bright red blood per rectum. GENITOURINARY: Denies dysuria, hematuria. ENDOCRINE: Denies nocturia or thyroid problems. HEMATOLOGIC AND LYMPHATIC: Denies easy bruising or bleeding. SKIN: Denies rash or lesion. MUSCULOSKELETAL: Denies pain in neck, back, shoulder, knees, hips or arthritic symptoms.  NEUROLOGIC: Denies paralysis, paresthesias.  PSYCHIATRIC: Denies anxiety or depressive symptoms.   VITAL SIGNS:  Blood pressure 114/60, pulse 77,  temperature 98 F (36.7 C), temperature source Oral, resp. rate 14, height 5\' 7"  (1.702 m), weight 56.7 kg (125 lb), SpO2 95 %.  I/O:   Intake/Output Summary (Last 24 hours) at 07/27/15 1005 Last data filed at 07/27/15 0947  Gross per 24 hour  Intake    632 ml  Output    650 ml  Net    -18 ml    PHYSICAL EXAMINATION:  GENERAL:  64 y.o.-year-old patient lying in the bed with no acute distress.  EYES: Pupils equal, round, reactive to light and accommodation. No scleral icterus. Extraocular muscles intact.  HEENT: Head atraumatic, normocephalic. Oropharynx and nasopharynx clear.  NECK:  Supple, no jugular venous distention. No thyroid enlargement, no tenderness.  LUNGS: Normal breath sounds bilaterally, no wheezing, rales,rhonchi or crepitation. No use of  accessory muscles of respiration.  CARDIOVASCULAR: S1, S2 normal. No murmurs, rubs, or gallops.  ABDOMEN: Soft, non-tender, non-distended. Bowel sounds present. No organomegaly or mass.  EXTREMITIES: No pedal edema, cyanosis, or clubbing.  NEUROLOGIC: Cranial nerves II through XII are intact. Muscle strength 5/5 in all extremities. Sensation intact. Gait not checked.  PSYCHIATRIC: The patient is alert and oriented x 3.  SKIN: No obvious rash, lesion, or ulcer.   DATA REVIEW:   CBC  Recent Labs Lab 07/24/15 0507  WBC 14.7*  HGB 7.8*  HCT 23.3*  PLT 263    Chemistries   Recent Labs Lab 07/25/15 1350 07/26/15 0606  NA 135  --   K 3.4*  --   CL 100*  --   CO2 25  --   GLUCOSE 106*  --   BUN 14  --   CREATININE 1.45*  --   CALCIUM 8.1*  --   MG  --  1.8  AST 75*  --   ALT 34  --   ALKPHOS 420*  --   BILITOT 2.9*  --     Cardiac Enzymes No results for input(s): TROPONINI in the last 168 hours.  Microbiology Results  Results for orders placed or performed during the hospital encounter of 07/22/15  C difficile quick scan w PCR reflex     Status: None   Collection Time: 07/23/15 10:42 AM  Result Value Ref Range Status   C Diff antigen NEGATIVE NEGATIVE Final   C Diff toxin NEGATIVE NEGATIVE Final   C Diff interpretation NOT NEEDED  Final    RADIOLOGY:  US Renal  07/24/2015  CLINICAL DATA:  Acute renal failure. EXAM: RENAL / URINARY TRACT ULTRASOUND COMPLETE COMPARISON:  CT scan of May 11, 2015. FINDINGS: Right Kidney: Length: 11.3 cm. Echogenicity within normal limits. No mass or hydronephrosis visualized. Left Kidney: Length: 5.1 cm consistent with severe atrophy. Increased echogenicity is noted. 3 cm simple cyst arises from upper pole. No mass or hydronephrosis visualized. Bladder: Appears normal for degree of bladder distention. IMPRESSION: Severe left renal atrophy is noted. Right kidney appears grossly normal. Electronically Signed   By: Marijo Conception, M.D.    On: 07/24/2015 14:23   US Abdomen Limited Ruq  07/24/2015  CLINICAL DATA:  Elevated liver enzymes. EXAM: US ABDOMEN LIMITED - RIGHT UPPER QUADRANT COMPARISON:  CT scan of May 11, 2015. FINDINGS: Gallbladder: No gallstones or pericholecystic fluid is noted. Mild gallbladder wall thickening is noted at 3.1 mm. No sonographic Murphy sign noted. Several echogenic foci are noted in gallbladder wall suggesting adenomyomatosis. Common bile duct: Diameter: 2.5 mm which is within normal limits. Liver: No focal lesion identified. Within  normal limits in parenchymal echogenicity. Mildly dilated portal vein is noted with normal hepatopetal flow. Small amount of fluid is noted around the liver. IMPRESSION: No cholelithiasis is noted. 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 No definite cholecystitis is noted. Small amount of free fluid is noted around the liver. No other abnormality seen. Electronically Signed   By: Marijo Conception, M.D.   On: 07/24/2015 11:25    EKG:   Orders placed or performed during the hospital encounter of 03/24/15  . ED EKG  . ED EKG      Management plans discussed with the patient, family and they are in agreement.  CODE STATUS:     Code Status Orders        Start     Ordered   07/23/15 0231  Full code   Continuous     07/23/15 0230    Advance Directive Documentation        Most Recent Value   Type of Advance Directive  Healthcare Power of Attorney   Pre-existing out of facility DNR order (yellow form or pink MOST form)     "MOST" Form in Place?        TOTAL TIME TAKING CARE OF THIS PATIENT: 45 minutes.    @[facility] @  on 07/27/2015 at 10:05 AM  Between 7am to 6pm - Pager - [phone]  After 6pm go to [email or web address] - password [email or web address] [facility]  Office  [phone]  CC: Primary care physician; Thersa Salt, DO

## 2015-07-27 NOTE — Telephone Encounter (Signed)
Patient has not left the hospital, needs Skilled nursing, not able to send there, discharge pending.

## 2015-07-27 NOTE — Telephone Encounter (Signed)
[name] called regarding her brother the Education officer, museum at [employer] called this morning that pt is no longer eligible for a insurance bed at a skilled facility they are sending him home. They will call adult protective service at [place]. [name] states she will let Sharrie Rothman know. Not sure if discharge will happen today or tomorrow. Thank You!

## 2015-07-27 NOTE — Progress Notes (Signed)
[facility] [place] NAME: Edward Singleton    MR#:  [id number]  DATE OF BIRTH:  07-19-1951   Progress notes 07/26/2015  SUBJECTIVE:  CHIEF COMPLAINT:  Patient is clinically feeling better. Sister is requesting to place him in a facility as he has chronic dementia and white matter disease . She is concerned  that he might not be able to take care of himeself as he is forgetful. Pt is refusing placement and wants to go home  REVIEW OF SYSTEMS:  CONSTITUTIONAL: No fever, fatigue or weakness.  EYES: No blurred or double vision.  EARS, NOSE, AND THROAT: No tinnitus or ear pain.  RESPIRATORY: No cough, shortness of breath, wheezing or hemoptysis.  CARDIOVASCULAR: No chest pain, orthopnea, edema.  GASTROINTESTINAL: No nausea, vomiting, diarrhea .reporting resolved  left lower quadrant abdominal pain.  GENITOURINARY: No dysuria, hematuria.  ENDOCRINE: No polyuria, nocturia,  HEMATOLOGY: No anemia, easy bruising or bleeding SKIN: No rash or lesion. MUSCULOSKELETAL: No joint pain or arthritis.   NEUROLOGIC: No tingling, numbness, weakness.  PSYCHIATRY: No anxiety or depression.   DRUG ALLERGIES:  No Known Allergies  VITALS:  Blood pressure 122/68, pulse 79, temperature 98.3 F (36.8 C), temperature source Oral, resp. rate 20, height 5\' 7"  (1.702 m), weight 56.7 kg (125 lb), SpO2 98 %.  PHYSICAL EXAMINATION:  GENERAL:  64 y.o.-year-old patient lying in the bed with no acute distress.  EYES: Pupils equal, round, reactive to light and accommodation. No scleral icterus. Extraocular muscles intact.  HEENT: Head atraumatic, normocephalic. Oropharynx and nasopharynx clear.  NECK:  Supple, no jugular venous distention. No thyroid enlargement, no tenderness.  LUNGS: Normal breath sounds bilaterally, no wheezing, rales,rhonchi or crepitation. No use of accessory muscles of respiration. Left  Lateral rib tenderness, reproducible CARDIOVASCULAR: S1,  S2 normal. No murmurs, rubs, or gallops.  ABDOMEN: Soft, improved left lower quadrant  tenderness, no rebound tenderness, nondistended. Bowel sounds present. No organomegaly or mass.  EXTREMITIES: No pedal edema, cyanosis, or clubbing.  NEUROLOGIC: Cranial nerves II through XII are intact. Muscle strength 5/5 in all extremities. Sensation intact. Gait not checked.  PSYCHIATRIC: The patient is alert and oriented x 3.  SKIN: No obvious rash, lesion, or ulcer.    LABORATORY PANEL:   CBC  Recent Labs Lab 07/24/15 0507  WBC 14.7*  HGB 7.8*  HCT 23.3*  PLT 263   ------------------------------------------------------------------------------------------------------------------  Chemistries   Recent Labs Lab 07/25/15 1350 07/26/15 0606 07/27/15 0938  NA 135  --  135  K 3.4*  --  3.4*  CL 100*  --  95*  CO2 25  --  32  GLUCOSE 106*  --  125*  BUN 14  --  13  CREATININE 1.45*  --  1.60*  CALCIUM 8.1*  --  8.5*  MG  --  1.8  --   AST 75*  --   --   ALT 34  --   --   ALKPHOS 420*  --   --   BILITOT 2.9*  --   --    ------------------------------------------------------------------------------------------------------------------  Cardiac Enzymes No results for input(s): TROPONINI in the last 168 hours. ------------------------------------------------------------------------------------------------------------------  RADIOLOGY:  No results found.  EKG:   Orders placed or performed during the hospital encounter of 03/24/15  . ED EKG  . ED EKG    ASSESSMENT AND PLAN:   64 year old Mr. Derbyshire with past medical history of fall with multiple fractures, hypertension, ongoing alcohol abuse, protein calorie  malnutrition, BPH, hypertension, so the emergency room with  1. Acute renal failure likely secondary to prerenal azotemia from GI losses 2/2 diarrhea. Creatinine is better  Renal u/s with lt renal atrophy , which is chronic per daughter Patient's diarrhea is  resolved Avoid nephrotoxins. Baseline creatinine is 1.06 (June 2016), pt is to f/u with nephro as as op     2. Elevated transaminases with elevated bilirubin in patient with history of ongoing history of alcoholism. Patient is awake and aler  ultrasound of the abtdomen with no cholecystitis, but adenomyomatosis of gb wall Needs outpatient alcohol rehabilitation pcp to consider rpt LFT'S during f/u visit in 2-3 weeks  3. Hypercholesterolemia. Patient currently is not on any statins. Given his elevated LFTs L hold off on starting him on any statins. We will defer this to his primary care physician.   4. Hypertension We'll resume by mouth home meds once blood pressure more stable.  5. Generalized weakness deconditioning weight loss PT recommended no PT follow up  6 severe protein calorie malnutrition/ongoing weight loss Seen by dietitian continue dietary supplements with ensure   7. Mild leukocytosis No source of infection identified. UA neg but has ca oxalate crystals , patient is asymptomatic  .8. Disposition-based on psych evaluation As there is a concern for safety issues per family , psych consult is placed .  Sister is requesting to place him in a facility as he has chronic dementia and white matter disease . She is concerned that he might not be able to take care of himeself as he is forgetful. Pt is refusing placement and wants to go home     All the records are reviewed and case discussed with Care Management/Social Workerr. Management plans discussed with the patient, family and they are in agreement.  CODE STATUS: Full    TOTAL TIME TAKING CARE OF THIS PATIENT: 35 minutes.   POSSIBLE D/C IN 2=3  DAYS, DEPENDING ON CLINICAL CONDITION.   Nicholes Mango M.D on 07/27/2015 at 7:56 PM  Between 7am to 6pm - Pager - [phone] After 6pm go to [email or web address] - password [email or web address] [facility]  Office  [phone]  CC: Primary care physician; Thersa Salt, DO

## 2015-07-27 NOTE — Discharge Instructions (Signed)
Activity as tolrated per PT recommendations  Diet - low salt , supplements F/u with PCP and nephrology as recommended

## 2015-07-28 ENCOUNTER — Telehealth: Payer: Self-pay

## 2015-07-28 NOTE — Telephone Encounter (Signed)
Patient discharged home. TCM call made.

## 2015-07-28 NOTE — Progress Notes (Signed)
Clinical Education officer, museum (CSW) made an Adult Scientist, forensic report in [place] based on psych note.    Edward Singleton, [name] [phone]

## 2015-07-28 NOTE — Telephone Encounter (Signed)
Transition Care Management Follow-up Telephone Call  Date discharged? 07/27/15  How have you been since you were released from the hospital? Short tempered.  Ate all 3 meals while in the hospital, but no so much since getting home.  No beer from the house.  Overall, looks good and doing okay.    Do you understand why you were in the hospital? Yes   Do you understand the discharge instructions? Yes   Where were you discharged to? Home   Items Reviewed:  Medications reviewed: Yes  Allergies reviewed: Yes  Dietary changes reviewed: Yes  Referrals reviewed: Yes   Functional Questionnaire:   Activities of Daily Living (ADLs):   He states they are independent in the following: All ADLs States they require assistance with the following: No assistance required at this time.   Any transportation issues/concerns?: No, sister to bring to appointment.   Any patient concerns?    Now that the plan has changed and sisters have removed all alcohol beverages from the house I am not sure where to go from here.   Confirmed importance and date/time of follow-up visits scheduled Yes, appointment scheduled 07/30/15.  Provider Appointment booked with Dr. Lacinda Axon (PCP).  Confirmed with patient if condition begins to worsen call PCP or go to the ER.  Patient was given the office number and encouraged to call back with question or concerns.  : Yes, sister [name]) verbalized understanding.

## 2015-07-30 ENCOUNTER — Ambulatory Visit (INDEPENDENT_AMBULATORY_CARE_PROVIDER_SITE_OTHER): Payer: 59 | Admitting: Family Medicine

## 2015-07-30 ENCOUNTER — Encounter: Payer: Self-pay | Admitting: Family Medicine

## 2015-07-30 VITALS — BP 142/74 | HR 82 | Temp 98.5°F | Ht 66.0 in | Wt 131.5 lb

## 2015-07-30 DIAGNOSIS — N1832 Chronic kidney disease, stage 3b: Secondary | ICD-10-CM | POA: Insufficient documentation

## 2015-07-30 DIAGNOSIS — N183 Chronic kidney disease, stage 3 unspecified: Secondary | ICD-10-CM

## 2015-07-30 DIAGNOSIS — I701 Atherosclerosis of renal artery: Secondary | ICD-10-CM | POA: Insufficient documentation

## 2015-07-30 DIAGNOSIS — R197 Diarrhea, unspecified: Secondary | ICD-10-CM | POA: Diagnosis not present

## 2015-07-30 DIAGNOSIS — N261 Atrophy of kidney (terminal): Secondary | ICD-10-CM | POA: Diagnosis not present

## 2015-07-30 DIAGNOSIS — N179 Acute kidney failure, unspecified: Secondary | ICD-10-CM | POA: Diagnosis not present

## 2015-07-30 DIAGNOSIS — N189 Chronic kidney disease, unspecified: Principal | ICD-10-CM

## 2015-07-30 LAB — COMPREHENSIVE METABOLIC PANEL
ALK PHOS: 271 U/L — AB (ref 39–117)
ALT: 22 U/L (ref 0–53)
AST: 52 U/L — ABNORMAL HIGH (ref 0–37)
Albumin: 3.1 g/dL — ABNORMAL LOW (ref 3.5–5.2)
BUN: 9 mg/dL (ref 6–23)
CO2: 27 meq/L (ref 19–32)
Calcium: 9 mg/dL (ref 8.4–10.5)
Chloride: 96 mEq/L (ref 96–112)
Creatinine, Ser: 1.22 mg/dL (ref 0.40–1.50)
GFR: 63.71 mL/min (ref >60.00)
GLUCOSE: 89 mg/dL (ref 70–99)
POTASSIUM: 5.2 meq/L — AB (ref 3.5–5.1)
Sodium: 134 mEq/L — ABNORMAL LOW (ref 135–145)
TOTAL PROTEIN: 6 g/dL (ref 6.0–8.3)
Total Bilirubin: 2.4 mg/dL — ABNORMAL HIGH (ref 0.2–1.2)

## 2015-07-30 LAB — CBC
HEMATOCRIT: 29.2 % — AB (ref 39.0–52.0)
Hemoglobin: 9.3 g/dL — ABNORMAL LOW (ref 13.0–17.0)
MCHC: 32 g/dL (ref 30.0–36.0)
Platelets: 333 10*3/uL (ref 150.0–400.0)
RBC: 2.46 Mil/uL — AB (ref 4.22–5.81)
RDW: 17.4 % — AB (ref 11.5–15.5)
WBC: 13.3 10*3/uL — AB (ref 4.0–10.5)

## 2015-07-30 NOTE — Progress Notes (Signed)
Subjective:  Patient ID: Edward Singleton, male    DOB: 12-29-1950  Age: 64 y.o. MRN: [id number]  CC: Hospital follow up.  HPI:  64 year old male, get a past medical history including alcohol abuse, hypertension, dementia secondary to alcohol abuse, protein calorie malnutrition, chronic kidney disease presents for hospital follow-up.  I have reviewed the patient's hospitalization and discharge summary. Hospital course is summarized below:  Patient made on 10/12 after labs that I obtained revealed acute renal failure. Patient was hydrated with IV fluids and creatinine slowly improved to near baseline. Creatinine was 1.6 prior to discharge. His blood pressure medications were held during his admission. Patient was seen by dietary regarding particular malnutrition. Patient was seen by nephrology during admission as well. He has follow-up with them scheduled on 10/31. Additionally, patient was ambulatory with physical therapy and psychiatry was consult regarding capacity. He was deemed stable to go home and was not discharged to skilled nursing facility although this was in the works prior to his admission.  Patient is today for follow-up. He states that he is doing well other than the fact that he has developed diarrhea since being home. He states that he has had several bouts of loose stools. No reported fever. No other complaints. He is accompanied by his sister who reports that he has been not eating and drinking as well as he was during his hospital stay. She continues to be concerned about him being home for long periods of time by himself. He has been abstinent of alcohol since his hospitalization and since he's been home.  Social Hx   Social History   Social History  . Marital Status: Single    Spouse Name: N/A  . Number of Children: N/A  . Years of Education: N/A   Social History Main Topics  . Smoking status: Current Every Day Smoker -- 1.00 packs/day for .5 years    Types: Cigarettes   . Smokeless tobacco: Never Used  . Alcohol Use: 25.2 oz/week    42 Cans of beer, 0 Standard drinks or equivalent per week  . Drug Use: No  . Sexual Activity: Not Asked   Other Topics Concern  . None   Social History Narrative   Review of Systems  Constitutional: Positive for appetite change. Negative for fever.  Gastrointestinal: Positive for diarrhea.   Objective:  BP 142/74 mmHg  Pulse 82  Temp(Src) 98.5 F (36.9 C) (Oral)  Ht 5\' 6"  (1.676 m)  Wt 131 lb 8 oz (59.648 kg)  BMI 21.23 kg/m2  SpO2 99%  BP/Weight 07/30/2015 07/27/2015 0000000  Systolic BP [id number] -  Diastolic BP 74 68 -  Wt. (Lbs) 131.5 - 125  BMI 21.23 - 19.57   Physical Exam  Constitutional:  Currently ill appearing male in no acute distress.  Cardiovascular: Normal rate and regular rhythm.   Pulmonary/Chest: Effort normal and breath sounds normal. No respiratory distress. He has no wheezes. He has no rales.  Abdominal: Soft. He exhibits no distension. There is no tenderness.  Neurological: He is alert.  Psychiatric: He has a normal mood and affect.  Vitals reviewed.  Lab Results  Component Value Date   WBC 14.7* 07/24/2015   HGB 7.8* 07/24/2015   HCT 23.3* 07/24/2015   PLT 263 07/24/2015   GLUCOSE 125* 07/27/2015   CHOL 347* 07/22/2015   TRIG 356.0* 07/22/2015   HDL 21.20* 07/22/2015   LDLDIRECT 230.0 07/22/2015   LDLCALC 23 11/18/2014   ALT 34 07/25/2015  AST 75* 07/25/2015   NA 135 07/27/2015   K 3.4* 07/27/2015   CL 95* 07/27/2015   CREATININE 1.60* 07/27/2015   BUN 13 07/27/2015   CO2 32 07/27/2015   TSH 1.880 11/17/2014   INR 0.93 03/24/2015   HGBA1C 5.6 01/15/2014   Assessment & Plan:   Problem List Items Addressed This Visit    Acute on chronic renal failure [facility]) [facility] course reviewed and summarized in the note today. Medications reviewed. Creatinine improved during hospitalization. Given reports of diarrhea I'm rechecking today. Advised aggressive  hydration at home.      Diarrhea    Sending stool for PCR and culture.       Relevant Orders   Stool culture   Clostridium Difficile by PCR   CBC   Comprehensive metabolic panel   Kidney atrophy   CKD (chronic kidney disease), stage III     Follow-up: F/u to be scheduled after lab results report today.  Thersa Salt, DO

## 2015-07-30 NOTE — Patient Instructions (Signed)
It was nice to see you today.  Continue your current medications.  Stay well hydrated.  I will call with your lab results.  Take care  Dr. Lacinda Axon

## 2015-07-30 NOTE — Assessment & Plan Note (Signed)
Hospital course reviewed and summarized in the note today. Medications reviewed. Creatinine improved during hospitalization. Given reports of diarrhea I'm rechecking today. Advised aggressive hydration at home.

## 2015-07-30 NOTE — Assessment & Plan Note (Signed)
Sending stool for PCR and culture.

## 2015-07-30 NOTE — Progress Notes (Signed)
Pre visit review using our clinic review tool, if applicable. No additional management support is needed unless otherwise documented below in the visit note. 

## 2015-07-31 ENCOUNTER — Telehealth: Payer: Self-pay | Admitting: Family Medicine

## 2015-07-31 ENCOUNTER — Telehealth: Payer: Self-pay | Admitting: *Deleted

## 2015-07-31 LAB — CLOSTRIDIUM DIFFICILE BY PCR: CDIFFPCR: DETECTED — AB

## 2015-07-31 MED ORDER — METRONIDAZOLE 500 MG PO TABS: 500.0000 mg | ORAL_TABLET | Freq: Three times a day (TID) | ORAL | Status: DC

## 2015-07-31 NOTE — Telephone Encounter (Signed)
Called Dr. Lacinda Axon. He is aware and taking care of this.

## 2015-07-31 NOTE — Telephone Encounter (Signed)
Called and spoke with [name] (sister and healthcare power of attorney) as patient has dementia and would potential forget. Informed of positive C diff and treatment. Rx for Flagyl was sent to the pharmacy.

## 2015-07-31 NOTE — Telephone Encounter (Signed)
Dr. Lacinda Axon is taking care of this.

## 2015-07-31 NOTE — Telephone Encounter (Signed)
Called listed phone number which is [facility]. No answer. Left voice mail informing her to call back about lab results.

## 2015-07-31 NOTE — Telephone Encounter (Signed)
CRITICAL   Positive C-DIFF  Given to carrie

## 2015-08-03 LAB — STOOL CULTURE

## 2015-08-06 ENCOUNTER — Telehealth: Payer: Self-pay | Admitting: Family Medicine

## 2015-08-06 ENCOUNTER — Telehealth: Payer: Self-pay

## 2015-08-06 NOTE — Telephone Encounter (Signed)
Paper is in box: Pt sister Edward Singleton came into drop off paperwork to have filled out by Dr Lacinda Axon. Sister states that the DNR needs to be filled out as well, Pt does want to be resuscitated but wants to have CPR. Pt does not want to be on a feeding tube. Pt foes not want dialysis. Sister will speak to pt about the dialysis before confirming. [name] would like to be called once paperwork is completed. [place]!

## 2015-08-06 NOTE — Telephone Encounter (Signed)
Pts sister called to ask about the DNR sheet that was dropped off.  Due to the patient stating he did want to be brought back after coding Dr.cook was unable to sign the DNR sheet given. i stated that if he changed his mind and wanted to the DNR sheet he needed to change in our system and then dr.cook would be able to sign.

## 2015-08-06 NOTE — Telephone Encounter (Signed)
Paperwork filled and put up front

## 2015-08-11 LAB — CBC AND DIFFERENTIAL
HEMATOCRIT: 30 % — AB (ref 41–53)
HEMOGLOBIN: 9.8 g/dL — AB (ref 13.5–17.5)
NEUTROS ABS: 7 /uL
Platelets: 948 10*3/uL — AB (ref 150–399)
WBC: 12.2 10^3/mL

## 2015-08-11 LAB — BASIC METABOLIC PANEL
BUN: 9 mg/dL (ref 4–21)
Creatinine: 1.1 mg/dL (ref 0.6–1.3)
GLUCOSE: 82 mg/dL
POTASSIUM: 5.7 mmol/L — AB (ref 3.4–5.3)
SODIUM: 136 mmol/L — AB (ref 137–147)

## 2015-08-13 ENCOUNTER — Encounter: Payer: Self-pay | Admitting: Family Medicine

## 2015-08-13 ENCOUNTER — Ambulatory Visit (INDEPENDENT_AMBULATORY_CARE_PROVIDER_SITE_OTHER): Payer: 59 | Admitting: Family Medicine

## 2015-08-13 VITALS — BP 130/88 | HR 60 | Temp 98.4°F | Ht 66.0 in | Wt 133.4 lb

## 2015-08-13 DIAGNOSIS — I10 Essential (primary) hypertension: Secondary | ICD-10-CM

## 2015-08-13 DIAGNOSIS — G4700 Insomnia, unspecified: Secondary | ICD-10-CM | POA: Diagnosis not present

## 2015-08-13 DIAGNOSIS — F1027 Alcohol dependence with alcohol-induced persisting dementia: Secondary | ICD-10-CM

## 2015-08-13 DIAGNOSIS — N183 Chronic kidney disease, stage 3 unspecified: Secondary | ICD-10-CM

## 2015-08-13 DIAGNOSIS — A0472 Enterocolitis due to Clostridium difficile, not specified as recurrent: Secondary | ICD-10-CM | POA: Insufficient documentation

## 2015-08-13 DIAGNOSIS — Z0000 Encounter for general adult medical examination without abnormal findings: Secondary | ICD-10-CM | POA: Diagnosis not present

## 2015-08-13 DIAGNOSIS — A047 Enterocolitis due to Clostridium difficile: Secondary | ICD-10-CM

## 2015-08-13 DIAGNOSIS — E785 Hyperlipidemia, unspecified: Secondary | ICD-10-CM

## 2015-08-13 DIAGNOSIS — F1097 Alcohol use, unspecified with alcohol-induced persisting dementia: Secondary | ICD-10-CM

## 2015-08-13 DIAGNOSIS — Z23 Encounter for immunization: Secondary | ICD-10-CM | POA: Diagnosis not present

## 2015-08-13 DIAGNOSIS — I2510 Atherosclerotic heart disease of native coronary artery without angina pectoris: Secondary | ICD-10-CM | POA: Diagnosis not present

## 2015-08-13 DIAGNOSIS — I2584 Coronary atherosclerosis due to calcified coronary lesion: Secondary | ICD-10-CM

## 2015-08-13 DIAGNOSIS — F1010 Alcohol abuse, uncomplicated: Secondary | ICD-10-CM

## 2015-08-13 MED ORDER — THIAMINE HCL 100 MG PO TABS: 100.0000 mg | ORAL_TABLET | Freq: Every day | ORAL | Status: DC

## 2015-08-13 MED ORDER — METOPROLOL SUCCINATE ER 25 MG PO TB24: 25.0000 mg | ORAL_TABLET | Freq: Every day | ORAL | Status: DC

## 2015-08-13 MED ORDER — FOLIC ACID 1 MG PO TABS: 1.0000 mg | ORAL_TABLET | Freq: Every day | ORAL | Status: DC

## 2015-08-13 MED ORDER — TRAZODONE HCL 50 MG PO TABS: 50.0000 mg | ORAL_TABLET | Freq: Every day | ORAL | Status: DC

## 2015-08-13 MED ORDER — ALLOPURINOL 300 MG PO TABS: 150.0000 mg | ORAL_TABLET | Freq: Every day | ORAL | Status: DC

## 2015-08-13 NOTE — Assessment & Plan Note (Signed)
Stable

## 2015-08-13 NOTE — Assessment & Plan Note (Signed)
Referral to cardiology for eval and likely stress test.

## 2015-08-13 NOTE — Assessment & Plan Note (Signed)
Flu and pneumovax given today. Will arrange cologuard as patient does not want to proceed with colonoscopy. We discussed cutting back on smoking. Goal set for less than 1/2 ppd.

## 2015-08-13 NOTE — Assessment & Plan Note (Signed)
Now Sober.

## 2015-08-13 NOTE — Assessment & Plan Note (Signed)
Will obtain records. Stable currently.

## 2015-08-13 NOTE — Progress Notes (Signed)
Subjective:  Patient ID: Edward Singleton, male    DOB: 06-Jun-1951  Age: 64 y.o. MRN: [id number]  CC: Follow up diarrhea, chronic issues  HPI:  64 year old male accompanied past medical history including CKD, renal artery stenosis, kidney atrophy, history of substance abuse, history of alcohol abuse, and coronary artery calcification presents to clinic today for follow-up.  1) Diarrhea  Patient was recently in the hospital and following hospital visit developed worsening diarrhea.  C. difficile has been positive and he is currently undergoing treatment with Flagyl.  He states that he's doing well. He's having 1 bowel movement a day. Is due to finish his antibiotics at the end of this week.  No recent fevers or chills.  He looks well and is in good spirits today.  2) Alcohol abuse  Patient has now been abstinent of alcohol since the beginning of October.  Patient has been tempted to drink alcohol but has not done so.   3) CKD stage III   Stable. Now followed by Dr. Juleen China.  4) Dementia  Stable. Doing okay at this time. We have been in process of trying to get patient in a long-term facility but have been unsuccessful.  Social Hx   Social History   Social History  . Marital Status: Single    Spouse Name: N/A  . Number of Children: N/A  . Years of Education: N/A   Social History Main Topics  . Smoking status: Current Every Day Smoker -- 1.00 packs/day for .5 years    Types: Cigarettes  . Smokeless tobacco: Never Used  . Alcohol Use: 25.2 oz/week    42 Cans of beer, 0 Standard drinks or equivalent per week  . Drug Use: No  . Sexual Activity: Not Asked   Other Topics Concern  . None   Social History Narrative   Review of Systems  Constitutional: Negative.   Gastrointestinal:       Diarrhea resolved.   Objective:  BP 130/88 mmHg  Pulse 60  Temp(Src) 98.4 F (36.9 C) (Oral)  Ht 5\' 6"  (1.676 m)  Wt 133 lb 6 oz (60.499 kg)  BMI 21.54 kg/m2  SpO2  97%  BP/Weight 08/13/2015 07/30/2015 [id number]  Systolic BP [id number]  Diastolic BP 88 74 68  Wt. (Lbs) 133.38 131.5 -  BMI 21.54 21.23 -   Physical Exam  Constitutional: No distress.  In good spirits and appears well. Drastically different appearance from prior encounters  Cardiovascular: Normal rate and regular rhythm.   Pulmonary/Chest: Effort normal and breath sounds normal. He has no wheezes. He has no rales.  Abdominal: Soft. He exhibits no distension. There is no tenderness. There is no rebound and no guarding.  Neurological: He is alert.  Psychiatric: He has a normal mood and affect.  Vitals reviewed.  Lab Results  Component Value Date   WBC 13.3* 07/30/2015   HGB 9.3* 07/30/2015   HCT 29.2* 07/30/2015   PLT 333.0 07/30/2015   GLUCOSE 89 07/30/2015   CHOL 347* 07/22/2015   TRIG 356.0* 07/22/2015   HDL 21.20* 07/22/2015   LDLDIRECT 230.0 07/22/2015   LDLCALC 23 11/18/2014   ALT 22 07/30/2015   AST 52* 07/30/2015   NA 134* 07/30/2015   K 5.2* 07/30/2015   CL 96 07/30/2015   CREATININE 1.22 07/30/2015   BUN 9 07/30/2015   CO2 27 07/30/2015   TSH 1.880 11/17/2014   INR 0.93 03/24/2015   HGBA1C 5.6 01/15/2014    Assessment & Plan:  Problem List Items Addressed This Visit    Preventative health care - Primary    Flu and pneumovax given today. Will arrange cologuard as patient does not want to proceed with colonoscopy. We discussed cutting back on smoking. Goal set for less than 1/2 ppd.      Insomnia   Relevant Medications   allopurinol (ZYLOPRIM) 300 MG tablet   traZODone (DESYREL) 50 MG tablet   HTN (hypertension)    Well controlled. Continue metoprolol.       Relevant Medications   metoprolol succinate (TOPROL-XL) 25 MG 24 hr tablet   Dyslipidemia    Most recent lipid panel was markedly abnormal (I think this was related to acute renal failure that was occuring previously). Will need repeat lipid in ~ 1 month.       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       Relevant Medications   traZODone (DESYREL) 50 MG tablet   Coronary artery calcification    Referral to cardiology for eval and likely stress test.       Relevant Medications   metoprolol succinate (TOPROL-XL) 25 MG 24 hr tablet   Other Relevant Orders   Ambulatory referral to Cardiology   CKD (chronic kidney disease), stage III    Will obtain records. Stable currently.       C. difficile diarrhea    Doing well on treatment with flagyl.       Alcohol abuse    Now Sober.        Other Visit Diagnoses    Need for 23-polyvalent pneumococcal polysaccharide vaccine        Relevant Orders    Pneumococcal polysaccharide vaccine 23-valent greater than or equal to 2yo subcutaneous/IM (Completed)       Meds ordered this encounter  Medications  . DISCONTD: metoprolol tartrate (LOPRESSOR) 25 MG tablet    Sig:   . allopurinol (ZYLOPRIM) 300 MG tablet    Sig: Take 0.5 tablets (150 mg total) by mouth daily.    Dispense:  90 tablet    Refill:  1  . DISCONTD: folic acid (FOLVITE) 1 MG tablet    Sig: Take 1 tablet (1 mg total) by mouth daily.    Dispense:  90 tablet    Refill:  1  . DISCONTD: thiamine 100 MG tablet    Sig: Take 1 tablet (100 mg total) by mouth daily.    Dispense:  90 tablet    Refill:  1  . traZODone (DESYREL) 50 MG tablet    Sig: Take 1 tablet (50 mg total) by mouth at bedtime.    Dispense:  90 tablet    Refill:  1  . metoprolol succinate (TOPROL-XL) 25 MG 24 hr tablet    Sig: Take 1 tablet (25 mg total) by mouth daily.    Dispense:  90 tablet    Refill:  1  . folic acid (FOLVITE) 1 MG tablet    Sig: Take 1 tablet (1 mg total) by mouth daily.    Dispense:  90 tablet    Refill:  1  . thiamine 100 MG tablet    Sig: Take 1 tablet (100 mg total) by mouth daily.    Dispense:  90 tablet    Refill:  1    Follow-up: January  Thersa Salt, DO

## 2015-08-13 NOTE — Assessment & Plan Note (Signed)
Most recent lipid panel was markedly abnormal (I think this was related to acute renal failure that was occuring previously). Will need repeat lipid in ~ 1 month.

## 2015-08-13 NOTE — Progress Notes (Signed)
Pre visit review using our clinic review tool, if applicable. No additional management support is needed unless otherwise documented below in the visit note. 

## 2015-08-13 NOTE — Patient Instructions (Signed)
It was nice to see you today.  Continue your medications.  Rx's were sent.  Follow up in January.  Take care  Dr. Lacinda Axon

## 2015-08-13 NOTE — Assessment & Plan Note (Signed)
Well controlled. Continue metoprolol. 

## 2015-08-13 NOTE — Assessment & Plan Note (Signed)
Doing well on treatment with flagyl.

## 2015-08-14 ENCOUNTER — Encounter: Payer: Self-pay | Admitting: Family Medicine

## 2015-08-18 ENCOUNTER — Encounter: Payer: Self-pay | Admitting: Family Medicine

## 2015-08-19 ENCOUNTER — Telehealth: Payer: Self-pay | Admitting: Family Medicine

## 2015-08-19 ENCOUNTER — Encounter: Payer: Self-pay | Admitting: Family Medicine

## 2015-08-19 NOTE — Telephone Encounter (Signed)
Pt sister called to see if the lab results from the Kidney doctor came in  last Monday.  Thank You!

## 2015-08-20 NOTE — Telephone Encounter (Signed)
Left message to call back  

## 2015-08-20 NOTE — Telephone Encounter (Signed)
Fine by me 

## 2015-08-20 NOTE — Telephone Encounter (Signed)
Pt kidney doctor  Dr.Kolluru suggest he stop taking his Multi-vitamin due to his potassium being elevated. What are your thoughts?

## 2015-08-20 NOTE — Telephone Encounter (Signed)
[place] for patient to start taking multi-vitamin

## 2015-08-20 NOTE — Telephone Encounter (Signed)
Left detailed

## 2015-09-29 ENCOUNTER — Encounter: Payer: Self-pay | Admitting: Physical Medicine & Rehabilitation

## 2015-09-29 ENCOUNTER — Encounter: Payer: 59 | Attending: Physical Medicine & Rehabilitation | Admitting: Physical Medicine & Rehabilitation

## 2015-09-29 VITALS — BP 171/98 | HR 66 | Resp 14

## 2015-09-29 DIAGNOSIS — M542 Cervicalgia: Secondary | ICD-10-CM

## 2015-09-29 DIAGNOSIS — S42402S Unspecified fracture of lower end of left humerus, sequela: Secondary | ICD-10-CM | POA: Insufficient documentation

## 2015-09-29 DIAGNOSIS — N400 Enlarged prostate without lower urinary tract symptoms: Secondary | ICD-10-CM | POA: Diagnosis not present

## 2015-09-29 DIAGNOSIS — E7800 Pure hypercholesterolemia, unspecified: Secondary | ICD-10-CM | POA: Insufficient documentation

## 2015-09-29 DIAGNOSIS — F1097 Alcohol use, unspecified with alcohol-induced persisting dementia: Secondary | ICD-10-CM

## 2015-09-29 DIAGNOSIS — N261 Atrophy of kidney (terminal): Secondary | ICD-10-CM | POA: Diagnosis not present

## 2015-09-29 DIAGNOSIS — M7582 Other shoulder lesions, left shoulder: Secondary | ICD-10-CM

## 2015-09-29 DIAGNOSIS — F1010 Alcohol abuse, uncomplicated: Secondary | ICD-10-CM | POA: Insufficient documentation

## 2015-09-29 DIAGNOSIS — F1027 Alcohol dependence with alcohol-induced persisting dementia: Secondary | ICD-10-CM | POA: Diagnosis present

## 2015-09-29 DIAGNOSIS — M109 Gout, unspecified: Secondary | ICD-10-CM | POA: Diagnosis not present

## 2015-09-29 DIAGNOSIS — I10 Essential (primary) hypertension: Secondary | ICD-10-CM | POA: Diagnosis not present

## 2015-09-29 DIAGNOSIS — Z87442 Personal history of urinary calculi: Secondary | ICD-10-CM | POA: Insufficient documentation

## 2015-09-29 DIAGNOSIS — F0390 Unspecified dementia without behavioral disturbance: Secondary | ICD-10-CM | POA: Diagnosis not present

## 2015-09-29 DIAGNOSIS — G4700 Insomnia, unspecified: Secondary | ICD-10-CM | POA: Insufficient documentation

## 2015-09-29 DIAGNOSIS — F17210 Nicotine dependence, cigarettes, uncomplicated: Secondary | ICD-10-CM | POA: Insufficient documentation

## 2015-09-29 MED ORDER — METHOCARBAMOL 500 MG PO TABS: 500.0000 mg | ORAL_TABLET | Freq: Four times a day (QID) | ORAL | Status: DC | PRN

## 2015-09-29 NOTE — Progress Notes (Signed)
Subjective:    Patient ID: Edward Singleton, male    DOB: 03-25-1951, 64 y.o.   MRN: [id number]  HPI   Edward Singleton is here in follow up of his multi trauma and pain. He is complaining of left shoulder and neck pain. He was admitted to the hospital with dehydration and renal failure in October due to diarrhea and etoh use. He hasn't had a drink since being in the hospital. Sister has all of his money, cards, etc. He doesn't go the store without a family member. His other sister has all of his medications  He is still having some confusion at night. Family has applied for disability. Sister asked about driving and return to work capacity as well.    He has a pcp now in [place], Dr. Lacinda Axon. Both patient and sister are very pleased with him. He     Pain Inventory Average Pain 5 Pain Right Now 5 My pain is aching  In the last 24 hours, has pain interfered with the following? General activity 5 Relation with others 5 Enjoyment of life 5 What TIME of day is your pain at its worst? NA Sleep (in general) Poor  Pain is worse with: movement Pain improves with: NA Relief from Meds: 0  Mobility ability to climb steps?  yes do you drive?  no  Function employed # of hrs/week NA I need assistance with the following:  meal prep and shopping  Neuro/Psych confusion anxiety  Prior Studies x-rays CT/MRI  Physicians involved in your care Primary care Jason Fila Dr. Mackey [place]- [facility]   Family History  Problem Relation Age of Onset  . Nephrolithiasis Paternal Grandfather   . Kidney disease Neg Hx   . Prostate cancer Neg Hx   . Heart disease Mother   . Heart disease Father   . Diabetes Father    Social History   Social History  . Marital Status: Single    Spouse Name: N/A  . Number of Children: N/A  . Years of Education: N/A   Social History Main Topics  . Smoking status: Current Every Day Smoker -- 1.00 packs/day for .5 years    Types: Cigarettes  . Smokeless  tobacco: Never Used  . Alcohol Use: 25.2 oz/week    42 Cans of beer, 0 Standard drinks or equivalent per week  . Drug Use: No  . Sexual Activity: Not Asked   Other Topics Concern  . None   Social History Narrative   Past Surgical History  Procedure Laterality Date  . Orif humerus fracture Left 11/18/2014    Procedure: OPEN REDUCTION INTERNAL FIXATION (ORIF) DISTAL HUMERUS FRACTURE;  Surgeon: Rozanna Box, MD;  Location: [facility];  Service: Orthopedics;  Laterality: Left;  . Lithotripsy    . Left renal stent placement  2013  . Splenectomy     Past Medical History  Diagnosis Date  . Hypertension   . Hypercholesteremia   . Gout   . Pelvic fracture ([place]) 11/17/2014  . Benign enlargement of prostate   . Kidney stones   . Atrophic kidney   . Left flank pain, chronic   . 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 Alcohol abuse    BP 171/98 mmHg  Pulse 66  Resp 14  SpO2 98%  Opioid Risk Score:   Fall Risk Score:  `1  Depression screen PHQ 2/9  Depression screen PHQ 2/9 07/07/2015  Decreased Interest 0  Down, Depressed, Hopeless 0  PHQ -  2 Score 0  Altered sleeping 0  Tired, decreased energy 3  Change in appetite 0  Feeling bad or failure about yourself  0  Trouble concentrating 0  Moving slowly or fidgety/restless 0  Suicidal thoughts 0  PHQ-9 Score 3  Difficult doing work/chores Somewhat difficult     Review of Systems  Constitutional: Positive for unexpected weight change.  Cardiovascular:       Limb swelling  Psychiatric/Behavioral: Positive for confusion. The patient is nervous/anxious.   All other systems reviewed and are negative.      Objective:   Physical Exam  Head: Normocephalic. Has gained weight. Looks much better  Eyes: EOM are normal.  Neck: Normal range of motion.  Cardiovascular: Normal rate and regular rhythm.  Respiratory: Effort normal and breath sounds normal. No respiratory distress.  GI: Soft. Bowel sounds are normal. He exhibits no  distension.  Musculoskeletal:  Mild rotator cuff tendereness and tightness with PROM. Cervical muscles taut on left, left trap and SCM tight as well Neurological: he is more alert and appropriate today.  Patient is oriented to me, reason he's here. Improved insight and awareness. Still with STM deficits. Balance improved Psychiatric:  Calm and collected. INIATES MORE.     Assessment/Plan:  1. Functional deficits secondary to multitrauma after a fall with left distal humerus fracture, pelvic fx's status post ORIF humerus fx. --   2. ETOH abuse--this an ongoing issue as well as the associated dementia/SVD   -has been without a drink for two months thanks to family.  3. Left shoulder RTC syndrome, cervicalgia  -heat, ice  -low dose robaxin  -RTC exercises were provided.   4. SVD/dementia  -he needs closes  intermittent supervision -no driving, keys taken  -he is non- vocational given ongoing memory deficits and intermittent confusion 5. Insomnia: trazodone for sleep.  6. HTN/CAD: refilled metoprolol today.    -Dr. Lacinda Axon is now his PCP. I would like to thank him for his excellent care!  Follow up with me 3 months. Thirty minutes of face to face patient care time were spent during this visit. All questions were encouraged and answered.

## 2015-09-29 NOTE — Patient Instructions (Signed)
PLEASE CALL ME WITH ANY PROBLEMS OR QUESTIONS [id number]). HAVE A HAPPY HOLIDAY SEASON!!!   Impingement Syndrome, Rotator Cuff, Bursitis With Rehab Impingement syndrome is a condition that involves inflammation of the tendons of the rotator cuff and the subacromial bursa, that causes pain in the shoulder. The rotator cuff consists of four tendons and muscles that control much of the shoulder and upper arm function. The subacromial bursa is a fluid filled sac that helps reduce friction between the rotator cuff and one of the bones of the shoulder (acromion). Impingement syndrome is usually an overuse injury that causes swelling of the bursa (bursitis), swelling of the tendon (tendonitis), and/or a tear of the tendon (strain). Strains are classified into three categories. Grade 1 strains cause pain, but the tendon is not lengthened. Grade 2 strains include a lengthened ligament, due to the ligament being stretched or partially ruptured. With grade 2 strains there is still function, although the function may be decreased. Grade 3 strains include a complete tear of the tendon or muscle, and function is usually impaired. SYMPTOMS   Pain around the shoulder, often at the outer portion of the upper arm.  Pain that gets worse with shoulder function, especially when reaching overhead or lifting.  Sometimes, aching when not using the arm.  Pain that wakes you up at night.  Sometimes, tenderness, swelling, warmth, or redness over the affected area.  Loss of strength.  Limited motion of the shoulder, especially reaching behind the back (to the back pocket or to unhook bra) or across your body.  Crackling sound (crepitation) when moving the arm.  Biceps tendon pain and inflammation (in the front of the shoulder). Worse when bending the elbow or lifting. CAUSES  Impingement syndrome is often an overuse injury, in which chronic (repetitive) motions cause the tendons or bursa to become inflamed. A  strain occurs when a force is paced on the tendon or muscle that is greater than it can withstand. Common mechanisms of injury include: Stress from sudden increase in duration, frequency, or intensity of training.  Direct hit (trauma) to the shoulder.  Aging, erosion of the tendon with normal use.  Bony bump on shoulder (acromial spur). RISK INCREASES WITH:  Contact sports (football, wrestling, boxing).  Throwing sports (baseball, tennis, volleyball).  Weightlifting and bodybuilding.  Heavy labor.  Previous injury to the rotator cuff, including impingement.  Poor shoulder strength and flexibility.  Failure to warm up properly before activity.  Inadequate protective equipment.  Old age.  Bony bump on shoulder (acromial spur). PREVENTION   Warm up and stretch properly before activity.  Allow for adequate recovery between workouts.  Maintain physical fitness:  Strength, flexibility, and endurance.  Cardiovascular fitness.  Learn and use proper exercise technique. PROGNOSIS  If treated properly, impingement syndrome usually goes away within 6 weeks. Sometimes surgery is required.  RELATED COMPLICATIONS   Longer healing time if not properly treated, or if not given enough time to heal.  Recurring symptoms, that result in a chronic condition.  Shoulder stiffness, frozen shoulder, or loss of motion.  Rotator cuff tendon tear.  Recurring symptoms, especially if activity is resumed too soon, with overuse, with a direct blow, or when using poor technique. TREATMENT  Treatment first involves the use of ice and medicine, to reduce pain and inflammation. The use of strengthening and stretching exercises may help reduce pain with activity. These exercises may be performed at home or with a therapist. If non-surgical treatment is unsuccessful after more than  6 months, surgery may be advised. After surgery and rehabilitation, activity is usually possible in 3 months.    MEDICATION  If pain medicine is needed, nonsteroidal anti-inflammatory medicines (aspirin and ibuprofen), or other minor pain relievers (acetaminophen), are often advised.  Do not take pain medicine for 7 days before surgery.  Prescription pain relievers may be given, if your caregiver thinks they are needed. Use only as directed and only as much as you need.  Corticosteroid injections may be given by your caregiver. These injections should be reserved for the most serious cases, because they may only be given a certain number of times. HEAT AND COLD  Cold treatment (icing) should be applied for 10 to 15 minutes every 2 to 3 hours for inflammation and pain, and immediately after activity that aggravates your symptoms. Use ice packs or an ice massage.  Heat treatment may be used before performing stretching and strengthening activities prescribed by your caregiver, physical therapist, or athletic trainer. Use a heat pack or a warm water soak. SEEK MEDICAL CARE IF:   Symptoms get worse or do not improve in 4 to 6 weeks, despite treatment.  New, unexplained symptoms develop. (Drugs used in treatment may produce side effects.) EXERCISES  RANGE OF MOTION (ROM) AND STRETCHING EXERCISES - Impingement Syndrome (Rotator Cuff  Tendinitis, Bursitis) These exercises may help you when beginning to rehabilitate your injury. Your symptoms may go away with or without further involvement from your physician, physical therapist or athletic trainer. While completing these exercises, remember:   Restoring tissue flexibility helps normal motion to return to the joints. This allows healthier, less painful movement and activity.  An effective stretch should be held for at least 30 seconds.  A stretch should never be painful. You should only feel a gentle lengthening or release in the stretched tissue. STRETCH - Flexion, Standing  Stand with good posture. With an underhand grip on your right / left hand,  and an overhand grip on the opposite hand, grasp a broomstick or cane so that your hands are a little more than shoulder width apart.  Keeping your right / left elbow straight and shoulder muscles relaxed, push the stick with your opposite hand, to raise your right / left arm in front of your body and then overhead. Raise your arm until you feel a stretch in your right / left shoulder, but before you have increased shoulder pain.  Try to avoid shrugging your right / left shoulder as your arm rises, by keeping your shoulder blade tucked down and toward your mid-back spine. Hold for __________ seconds.  Slowly return to the starting position. Repeat __________ times. Complete this exercise __________ times per day. STRETCH - Abduction, Supine  Lie on your back. With an underhand grip on your right / left hand and an overhand grip on the opposite hand, grasp a broomstick or cane so that your hands are a little more than shoulder width apart.  Keeping your right / left elbow straight and your shoulder muscles relaxed, push the stick with your opposite hand, to raise your right / left arm out to the side of your body and then overhead. Raise your arm until you feel a stretch in your right / left shoulder, but before you have increased shoulder pain.  Try to avoid shrugging your right / left shoulder as your arm rises, by keeping your shoulder blade tucked down and toward your mid-back spine. Hold for __________ seconds.  Slowly return to the starting position.  Repeat __________ times. Complete this exercise __________ times per day. ROM - Flexion, Active-Assisted  Lie on your back. You may bend your knees for comfort.  Grasp a broomstick or cane so your hands are about shoulder width apart. Your right / left hand should grip the end of the stick, so that your hand is positioned "thumbs-up," as if you were about to shake hands.  Using your healthy arm to lead, raise your right / left arm overhead,  until you feel a gentle stretch in your shoulder. Hold for __________ seconds.  Use the stick to assist in returning your right / left arm to its starting position. Repeat __________ times. Complete this exercise __________ times per day.  ROM - Internal Rotation, Supine   Lie on your back on a firm surface. Place your right / left elbow about 60 degrees away from your side. Elevate your elbow with a folded towel, so that the elbow and shoulder are the same height.  Using a broomstick or cane and your strong arm, pull your right / left hand toward your body until you feel a gentle stretch, but no increase in your shoulder pain. Keep your shoulder and elbow in place throughout the exercise.  Hold for __________ seconds. Slowly return to the starting position. Repeat __________ times. Complete this exercise __________ times per day. STRETCH - Internal Rotation  Place your right / left hand behind your back, palm up.  Throw a towel or belt over your opposite shoulder. Grasp the towel with your right / left hand.  While keeping an upright posture, gently pull up on the towel, until you feel a stretch in the front of your right / left shoulder.  Avoid shrugging your right / left shoulder as your arm rises, by keeping your shoulder blade tucked down and toward your mid-back spine.  Hold for __________ seconds. Release the stretch, by lowering your healthy hand. Repeat __________ times. Complete this exercise __________ times per day. ROM - Internal Rotation   Using an underhand grip, grasp a stick behind your back with both hands.  While standing upright with good posture, slide the stick up your back until you feel a mild stretch in the front of your shoulder.  Hold for __________ seconds. Slowly return to your starting position. Repeat __________ times. Complete this exercise __________ times per day.  STRETCH - Posterior Shoulder Capsule   Stand or sit with good posture. Grasp your  right / left elbow and draw it across your chest, keeping it at the same height as your shoulder.  Pull your elbow, so your upper arm comes in closer to your chest. Pull until you feel a gentle stretch in the back of your shoulder.  Hold for __________ seconds. Repeat __________ times. Complete this exercise __________ times per day. STRENGTHENING EXERCISES - Impingement Syndrome (Rotator Cuff Tendinitis, Bursitis) These exercises may help you when beginning to rehabilitate your injury. They may resolve your symptoms with or without further involvement from your physician, physical therapist or athletic trainer. While completing these exercises, remember:  Muscles can gain both the endurance and the strength needed for everyday activities through controlled exercises.  Complete these exercises as instructed by your physician, physical therapist or athletic trainer. Increase the resistance and repetitions only as guided.  You may experience muscle soreness or fatigue, but the pain or discomfort you are trying to eliminate should never worsen during these exercises. If this pain does get worse, stop and make sure you are  following the directions exactly. If the pain is still present after adjustments, discontinue the exercise until you can discuss the trouble with your clinician.  During your recovery, avoid activity or exercises which involve actions that place your injured hand or elbow above your head or behind your back or head. These positions stress the tissues which you are trying to heal. STRENGTH - Scapular Depression and Adduction   With good posture, sit on a firm chair. Support your arms in front of you, with pillows, arm rests, or on a table top. Have your elbows in line with the sides of your body.  Gently draw your shoulder blades down and toward your mid-back spine. Gradually increase the tension, without tensing the muscles along the top of your shoulders and the back of your  neck.  Hold for __________ seconds. Slowly release the tension and relax your muscles completely before starting the next repetition.  After you have practiced this exercise, remove the arm support and complete the exercise in standing as well as sitting position. Repeat __________ times. Complete this exercise __________ times per day.  STRENGTH - Shoulder Abductors, Isometric  With good posture, stand or sit about 4-6 inches from a wall, with your right / left side facing the wall.  Bend your right / left elbow. Gently press your right / left elbow into the wall. Increase the pressure gradually, until you are pressing as hard as you can, without shrugging your shoulder or increasing any shoulder discomfort.  Hold for __________ seconds.  Release the tension slowly. Relax your shoulder muscles completely before you begin the next repetition. Repeat __________ times. Complete this exercise __________ times per day.  STRENGTH - External Rotators, Isometric  Keep your right / left elbow at your side and bend it 90 degrees.  Step into a door frame so that the outside of your right / left wrist can press against the door frame without your upper arm leaving your side.  Gently press your right / left wrist into the door frame, as if you were trying to swing the back of your hand away from your stomach. Gradually increase the tension, until you are pressing as hard as you can, without shrugging your shoulder or increasing any shoulder discomfort.  Hold for __________ seconds.  Release the tension slowly. Relax your shoulder muscles completely before you begin the next repetition. Repeat __________ times. Complete this exercise __________ times per day.  STRENGTH - Supraspinatus   Stand or sit with good posture. Grasp a __________ weight, or an exercise band or tubing, so that your hand is "thumbs-up," like you are shaking hands.  Slowly lift your right / left arm in a "V" away from your  thigh, diagonally into the space between your side and straight ahead. Lift your hand to shoulder height or as far as you can, without increasing any shoulder pain. At first, many people do not lift their hands above shoulder height.  Avoid shrugging your right / left shoulder as your arm rises, by keeping your shoulder blade tucked down and toward your mid-back spine.  Hold for __________ seconds. Control the descent of your hand, as you slowly return to your starting position. Repeat __________ times. Complete this exercise __________ times per day.  STRENGTH - External Rotators  Secure a rubber exercise band or tubing to a fixed object (table, pole) so that it is at the same height as your right / left elbow when you are standing or sitting on a  firm surface.  Stand or sit so that the secured exercise band is at your uninjured side.  Bend your right / left elbow 90 degrees. Place a folded towel or small pillow under your right / left arm, so that your elbow is a few inches away from your side.  Keeping the tension on the exercise band, pull it away from your body, as if pivoting on your elbow. Be sure to keep your body steady, so that the movement is coming only from your rotating shoulder.  Hold for __________ seconds. Release the tension in a controlled manner, as you return to the starting position. Repeat __________ times. Complete this exercise __________ times per day.  STRENGTH - Internal Rotators   Secure a rubber exercise band or tubing to a fixed object (table, pole) so that it is at the same height as your right / left elbow when you are standing or sitting on a firm surface.  Stand or sit so that the secured exercise band is at your right / left side.  Bend your elbow 90 degrees. Place a folded towel or small pillow under your right / left arm so that your elbow is a few inches away from your side.  Keeping the tension on the exercise band, pull it across your body, toward  your stomach. Be sure to keep your body steady, so that the movement is coming only from your rotating shoulder.  Hold for __________ seconds. Release the tension in a controlled manner, as you return to the starting position. Repeat __________ times. Complete this exercise __________ times per day.  STRENGTH - Scapular Protractors, Standing   Stand arms length away from a wall. Place your hands on the wall, keeping your elbows straight.  Begin by dropping your shoulder blades down and toward your mid-back spine.  To strengthen your protractors, keep your shoulder blades down, but slide them forward on your rib cage. It will feel as if you are lifting the back of your rib cage away from the wall. This is a subtle motion and can be challenging to complete. Ask your caregiver for further instruction, if you are not sure you are doing the exercise correctly.  Hold for __________ seconds. Slowly return to the starting position, resting the muscles completely before starting the next repetition. Repeat __________ times. Complete this exercise __________ times per day. STRENGTH - Scapular Protractors, Supine  Lie on your back on a firm surface. Extend your right / left arm straight into the air while holding a __________ weight in your hand.  Keeping your head and back in place, lift your shoulder off the floor.  Hold for __________ seconds. Slowly return to the starting position, and allow your muscles to relax completely before starting the next repetition. Repeat __________ times. Complete this exercise __________ times per day. STRENGTH - Scapular Protractors, Quadruped  Get onto your hands and knees, with your shoulders directly over your hands (or as close as you can be, comfortably).  Keeping your elbows locked, lift the back of your rib cage up into your shoulder blades, so your mid-back rounds out. Keep your neck muscles relaxed.  Hold this position for __________ seconds. Slowly  return to the starting position and allow your muscles to relax completely before starting the next repetition. Repeat __________ times. Complete this exercise __________ times per day.  STRENGTH - Scapular Retractors  Secure a rubber exercise band or tubing to a fixed object (table, pole), so that it is at the  height of your shoulders when you are either standing, or sitting on a firm armless chair.  With a palm down grip, grasp an end of the band in each hand. Straighten your elbows and lift your hands straight in front of you, at shoulder height. Step back, away from the secured end of the band, until it becomes tense.  Squeezing your shoulder blades together, draw your elbows back toward your sides, as you bend them. Keep your upper arms lifted away from your body throughout the exercise.  Hold for __________ seconds. Slowly ease the tension on the band, as you reverse the directions and return to the starting position. Repeat __________ times. Complete this exercise __________ times per day. STRENGTH - Shoulder Extensors   Secure a rubber exercise band or tubing to a fixed object (table, pole) so that it is at the height of your shoulders when you are either standing, or sitting on a firm armless chair.  With a thumbs-up grip, grasp an end of the band in each hand. Straighten your elbows and lift your hands straight in front of you, at shoulder height. Step back, away from the secured end of the band, until it becomes tense.  Squeezing your shoulder blades together, pull your hands down to the sides of your thighs. Do not allow your hands to go behind you.  Hold for __________ seconds. Slowly ease the tension on the band, as you reverse the directions and return to the starting position. Repeat __________ times. Complete this exercise __________ times per day.  STRENGTH - Scapular Retractors and External Rotators   Secure a rubber exercise band or tubing to a fixed object (table, pole)  so that it is at the height as your shoulders, when you are either standing, or sitting on a firm armless chair.  With a palm down grip, grasp an end of the band in each hand. Bend your elbows 90 degrees and lift your elbows to shoulder height, at your sides. Step back, away from the secured end of the band, until it becomes tense.  Squeezing your shoulder blades together, rotate your shoulders so that your upper arms and elbows remain stationary, but your fists travel upward to head height.  Hold for __________ seconds. Slowly ease the tension on the band, as you reverse the directions and return to the starting position. Repeat __________ times. Complete this exercise __________ times per day.  STRENGTH - Scapular Retractors and External Rotators, Rowing   Secure a rubber exercise band or tubing to a fixed object (table, pole) so that it is at the height of your shoulders, when you are either standing, or sitting on a firm armless chair.  With a palm down grip, grasp an end of the band in each hand. Straighten your elbows and lift your hands straight in front of you, at shoulder height. Step back, away from the secured end of the band, until it becomes tense.  Step 1: Squeeze your shoulder blades together. Bending your elbows, draw your hands to your chest, as if you are rowing a boat. At the end of this motion, your hands and elbow should be at shoulder height and your elbows should be out to your sides.  Step 2: Rotate your shoulders, to raise your hands above your head. Your forearms should be vertical and your upper arms should be horizontal.  Hold for __________ seconds. Slowly ease the tension on the band, as you reverse the directions and return to the starting position. Repeat  __________ times. Complete this exercise __________ times per day.  STRENGTH - Scapular Depressors  Find a sturdy chair without wheels, such as a dining room chair.  Keeping your feet on the floor, and your  hands on the chair arms, lift your bottom up from the seat, and lock your elbows.  Keeping your elbows straight, allow gravity to pull your body weight down. Your shoulders will rise toward your ears.  Raise your body against gravity by drawing your shoulder blades down your back, shortening the distance between your shoulders and ears. Although your feet should always maintain contact with the floor, your feet should progressively support less body weight, as you get stronger.  Hold for __________ seconds. In a controlled and slow manner, lower your body weight to begin the next repetition. Repeat __________ times. Complete this exercise __________ times per day.    This information is not intended to replace advice given to you by your health care provider. Make sure you discuss any questions you have with your health care provider.   Document Released: 09/26/2005 Document Revised: 10/17/2014 Document Reviewed: 01/08/2009 [employer] Interactive Patient Education [employer].

## 2015-10-16 ENCOUNTER — Encounter: Payer: Self-pay | Admitting: Cardiovascular Disease

## 2015-10-16 ENCOUNTER — Ambulatory Visit (INDEPENDENT_AMBULATORY_CARE_PROVIDER_SITE_OTHER): Payer: BLUE CROSS/BLUE SHIELD | Admitting: Cardiovascular Disease

## 2015-10-16 VITALS — BP 144/82 | HR 64 | Ht 67.0 in | Wt 141.5 lb

## 2015-10-16 DIAGNOSIS — I2510 Atherosclerotic heart disease of native coronary artery without angina pectoris: Secondary | ICD-10-CM | POA: Diagnosis not present

## 2015-10-16 DIAGNOSIS — I25118 Atherosclerotic heart disease of native coronary artery with other forms of angina pectoris: Secondary | ICD-10-CM

## 2015-10-16 DIAGNOSIS — I2584 Coronary atherosclerosis due to calcified coronary lesion: Secondary | ICD-10-CM | POA: Diagnosis not present

## 2015-10-16 DIAGNOSIS — I10 Essential (primary) hypertension: Secondary | ICD-10-CM

## 2015-10-16 DIAGNOSIS — N183 Chronic kidney disease, stage 3 unspecified: Secondary | ICD-10-CM

## 2015-10-16 DIAGNOSIS — I739 Peripheral vascular disease, unspecified: Secondary | ICD-10-CM

## 2015-10-16 DIAGNOSIS — E43 Unspecified severe protein-calorie malnutrition: Secondary | ICD-10-CM

## 2015-10-16 DIAGNOSIS — M79603 Pain in arm, unspecified: Secondary | ICD-10-CM

## 2015-10-16 DIAGNOSIS — E785 Hyperlipidemia, unspecified: Secondary | ICD-10-CM

## 2015-10-16 DIAGNOSIS — F1010 Alcohol abuse, uncomplicated: Secondary | ICD-10-CM

## 2015-10-16 NOTE — Assessment & Plan Note (Signed)
Most recent creatinine 1.4, followed by nephrology

## 2015-10-16 NOTE — Assessment & Plan Note (Signed)
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

## 2015-10-16 NOTE — Assessment & Plan Note (Signed)
Family is restricting his finances, controlling his medications So far has not been drinking since his last hospital admission

## 2015-10-16 NOTE — Patient Instructions (Addendum)
You are doing well. No medication changes were made.  We will schedule a lexiscan myoview for arm pain, coronary artery disease  [place] caregiver has ordered a Stress Test with nuclear imaging. The purpose of this test is to evaluate the blood supply to your heart muscle. This procedure is referred to as a "Non-Invasive Stress Test." This is because other than having an IV started in your vein, nothing is inserted or "invades" your body. Cardiac stress tests are done to find areas of poor blood flow to the heart by determining the extent of coronary artery disease (CAD). Some patients exercise on a treadmill, which naturally increases the blood flow to your heart, while others who are  unable to walk on a treadmill due to physical limitations have a pharmacologic/chemical stress agent called Lexiscan . This medicine will mimic walking on a treadmill by temporarily increasing your coronary blood flow.   Please note: these test may take anywhere between 2-4 hours to complete  PLEASE REPORT TO [facility] AT THE FIRST DESK WILL DIRECT YOU WHERE TO GO  Date of Procedure:______Thursday, January 12 ____________  Arrival Time for Procedure:___7:15 am______________  Instructions regarding medication:   __X__:  Hold METOPROLOL the night before and morning of procedure  How to prepare for your Myoview test:   Do not eat or drink after midnight  No caffeine for 24 hours prior to test  No smoking 24 hours prior to test.  Your medication may be taken with water.  If your doctor stopped a medication because of this test, do not take that medication.  Ladies, please do not wear dresses.  Skirts or pants are appropriate. Please wear a short sleeve shirt.  No perfume, cologne or lotion.   We will also schedule a lower extremity arterial doppler  Date & time: __Today @ 2:00_________________   We will check lipids and LFTs today  Please call us if  you have new issues that need to be addressed before your next appt.  Your physician wants you to follow-up in: 6 months.  You will receive a reminder letter in the mail two months in advance. If you don't receive a letter, please call our office to schedule the follow-up appointment.   Cardiac Nuclear Scanning A cardiac nuclear scan is used to check your heart for problems, such as the following:  A portion of the heart is not getting enough blood.  Part of the heart muscle has died, which happens with a heart attack.  The heart wall is not working normally.  In this test, a radioactive dye (tracer) is injected into your bloodstream. After the tracer has traveled to your heart, a scanning device is used to measure how much of the tracer is absorbed by or distributed to various areas of your heart. LET [facility] CARE PROVIDER KNOW ABOUT:  Any allergies you have.  All medicines you are taking, including vitamins, herbs, eye drops, creams, and over-the-counter medicines.  Previous problems you or members of your family have had with the use of anesthetics.  Any blood disorders you have.  Previous surgeries you have had.  Medical conditions you have.  RISKS AND COMPLICATIONS Generally, this is a safe procedure. However, as with any procedure, problems can occur. Possible problems include:   Serious chest pain.  Rapid heartbeat.  Sensation of warmth in your chest. This usually passes quickly. BEFORE THE PROCEDURE Ask your health care provider about changing or stopping  your regular medicines. PROCEDURE This procedure is usually done at a hospital and takes 2-4 hours.  An IV tube is inserted into one of your veins.  Your health care provider will inject a small amount of radioactive tracer through the tube.  You will then wait for 20-40 minutes while the tracer travels through your bloodstream.  You will lie down on an exam table so images of your heart can be taken.  Images will be taken for about 15-20 minutes.  You will exercise on a treadmill or stationary bike. While you exercise, your heart activity will be monitored with an electrocardiogram (ECG), and your blood pressure will be checked.  If you are unable to exercise, you may be given a medicine to make your heart beat faster.  When blood flow to your heart has peaked, tracer will again be injected through the IV tube.  After 20-40 minutes, you will get back on the exam table and have more images taken of your heart.  When the procedure is over, your IV tube will be removed. AFTER THE PROCEDURE  You will likely be able to leave shortly after the test. Unless your health care provider tells you otherwise, you may return to your normal schedule, including diet, activities, and medicines.  Make sure you find out how and when you will get your test results.   This information is not intended to replace advice given to you by your health care provider. Make sure you discuss any questions you have with your health care provider.   Document Released: 10/21/2004 Document Revised: 10/01/2013 Document Reviewed: 09/04/2013 Elsevier Interactive Patient Education 2016 [name]. Steps to Quit Smoking  Smoking tobacco can be harmful to your health and can affect almost every organ in your body. Smoking puts you, and those around you, at risk for developing many serious chronic diseases. Quitting smoking is difficult, but it is one of the best things that you can do for your health. It is never too late to quit. WHAT ARE THE BENEFITS OF QUITTING SMOKING? When you quit smoking, you lower your risk of developing serious diseases and conditions, such as:  Lung cancer or lung disease, such as COPD.  Heart disease.  Stroke.  Heart attack.  Infertility.  Osteoporosis and bone fractures. Additionally, symptoms such as coughing, wheezing, and shortness of breath may get better when you quit. You may  also find that you get sick less often because your body is stronger at fighting off colds and infections. If you are pregnant, quitting smoking can help to reduce your chances of having a baby of low birth weight. HOW DO I GET READY TO QUIT? When you decide to quit smoking, create a plan to make sure that you are successful. Before you quit:  Pick a date to quit. Set a date within the next two weeks to give you time to prepare.  Write down the reasons why you are quitting. Keep this list in places where you will see it often, such as on your bathroom mirror or in your car or wallet.  Identify the people, places, things, and activities that make you want to smoke (triggers) and avoid them. Make sure to take these actions:  Throw away all cigarettes at home, at work, and in your car.  Throw away smoking accessories, such as Scientist, research (medical).  Clean your car and make sure to empty the ashtray.  Clean your home, including curtains and carpets.  Tell your family, friends, and  coworkers that you are quitting. Support from your loved ones can make quitting easier.  Talk with your health care provider about your options for quitting smoking.  Find out what treatment options are covered by your health insurance. WHAT STRATEGIES CAN I USE TO QUIT SMOKING?  Talk with your healthcare provider about different strategies to quit smoking. Some strategies include:  Quitting smoking altogether instead of gradually lessening how much you smoke over a period of time. Research shows that quitting "cold [place]" is more successful than gradually quitting.  Attending in-person counseling to help you build problem-solving skills. You are more likely to have success in quitting if you attend several counseling sessions. Even short sessions of 10 minutes can be effective.  Finding resources and support systems that can help you to quit smoking and remain smoke-free after you quit. These resources are most  helpful when you use them often. They can include:  Online chats with a Social worker.  Telephone quitlines.  Printed Furniture conservator/restorer.  Support groups or group counseling.  Text messaging programs.  Mobile phone applications.  Taking medicines to help you quit smoking. (If you are pregnant or breastfeeding, talk with your health care provider first.) Some medicines contain nicotine and some do not. Both types of medicines help with cravings, but the medicines that include nicotine help to relieve withdrawal symptoms. Your health care provider may recommend:  Nicotine patches, gum, or lozenges.  Nicotine inhalers or sprays.  Non-nicotine medicine that is taken by mouth. Talk with your health care provider about combining strategies, such as taking medicines while you are also receiving in-person counseling. Using these two strategies together makes you more likely to succeed in quitting than if you used either strategy on its own. If you are pregnant or breastfeeding, talk with your health care provider about finding counseling or other support strategies to quit smoking. Do not take medicine to help you quit smoking unless told to do so by your health care provider. WHAT THINGS CAN I DO TO MAKE IT EASIER TO QUIT? Quitting smoking might feel overwhelming at first, but there is a lot that you can do to make it easier. Take these important actions:  Reach out to your family and friends and ask that they support and encourage you during this time. Call telephone quitlines, reach out to support groups, or work with a counselor for support.  Ask people who smoke to avoid smoking around you.  Avoid places that trigger you to smoke, such as bars, parties, or smoke-break areas at work.  Spend time around people who do not smoke.  Lessen stress in your life, because stress can be a smoking trigger for some people. To lessen stress, try:  Exercising regularly.  Deep-breathing  exercises.  Yoga.  Meditating.  Performing a body scan. This involves closing your eyes, scanning your body from head to toe, and noticing which parts of your body are particularly tense. Purposefully relax the muscles in those areas.  Download or purchase mobile phone or tablet apps (applications) that can help you stick to your quit plan by providing reminders, tips, and encouragement. There are many free apps, such as QuitGuide from the [employer] Office manager for Disease Control and Prevention). You can find other support for quitting smoking (smoking cessation) through smokefree.gov and other websites. HOW WILL I FEEL WHEN I QUIT SMOKING? Within the first 24 hours of quitting smoking, you may start to feel some withdrawal symptoms. These symptoms are usually most noticeable 2-3 days after  quitting, but they usually do not last beyond 2-3 weeks. Changes or symptoms that you might experience include:  Mood swings.  Restlessness, anxiety, or irritation.  Difficulty concentrating.  Dizziness.  Strong cravings for sugary foods in addition to nicotine.  Mild weight gain.  Constipation.  Nausea.  Coughing or a sore throat.  Changes in how your medicines work in your body.  A depressed mood.  Difficulty sleeping (insomnia). After the first 2-3 weeks of quitting, you may start to notice more positive results, such as:  Improved sense of smell and taste.  Decreased coughing and sore throat.  Slower heart rate.  Lower blood pressure.  Clearer skin.  The ability to breathe more easily.  Fewer sick days. Quitting smoking is very challenging for most people. Do not get discouraged if you are not successful the first time. Some people need to make many attempts to quit before they achieve long-term success. Do your best to stick to your quit plan, and talk with your health care provider if you have any questions or concerns.   This information is not intended to replace advice given to  you by your health care provider. Make sure you discuss any questions you have with your health care provider.   Document Released: 09/20/2001 Document Revised: 02/10/2015 Document Reviewed: 02/10/2015 Elsevier Interactive Patient Education 2016 [name]. Smoking Hazards Smoking cigarettes is extremely bad for your health. Tobacco smoke has over 200 known poisons in it. It contains the poisonous gases nitrogen oxide and carbon monoxide. There are over 60 chemicals in tobacco smoke that cause cancer. Some of the chemicals found in cigarette smoke include:   Cyanide.   Benzene.   Formaldehyde.   Methanol (wood alcohol).   Acetylene (fuel used in welding torches).   Ammonia.  Even smoking lightly shortens your life expectancy by several years. You can greatly reduce the risk of medical problems for you and your family by stopping now. Smoking is the most preventable cause of death and disease in our society. Within days of quitting smoking, your circulation improves, you decrease the risk of having a heart attack, and your lung capacity improves. There may be some increased phlegm in the first few days after quitting, and it may take months for your lungs to clear up completely. Quitting for 10 years reduces your risk of developing lung cancer to almost that of a nonsmoker.  WHAT ARE THE RISKS OF SMOKING? Cigarette smokers have an increased risk of many serious medical problems, including:  Lung cancer.   Lung disease (such as pneumonia, bronchitis, and emphysema).   Heart attack and chest pain due to the heart not getting enough oxygen (angina).   Heart disease and peripheral blood vessel disease.   Hypertension.   Stroke.   Oral cancer (cancer of the lip, mouth, or voice box).   Bladder cancer.   Pancreatic cancer.   Cervical cancer.   Pregnancy complications, including premature birth.   Stillbirths and smaller newborn babies, birth defects, and  genetic damage to sperm.   Early menopause.   Lower estrogen level for women.   Infertility.   Facial wrinkles.   Blindness.   Increased risk of broken bones (fractures).   Senile dementia.   Stomach ulcers and internal bleeding.   Delayed wound healing and increased risk of complications during surgery. Because of secondhand smoke exposure, children of smokers have an increased risk of the following:   Sudden infant death syndrome (SIDS).   Respiratory infections.  Lung cancer.   Heart disease.   Ear infections.  WHY IS SMOKING ADDICTIVE? Nicotine is the chemical agent in tobacco that is capable of causing addiction or dependence. When you smoke and inhale, nicotine is absorbed rapidly into the bloodstream through your lungs. Both inhaled and noninhaled nicotine may be addictive.  WHAT ARE THE BENEFITS OF QUITTING?  There are many health benefits to quitting smoking. Some are:   The likelihood of developing cancer and heart disease decreases. Health improvements are seen almost immediately.   Blood pressure, pulse rate, and breathing patterns start returning to normal soon after quitting.   People who quit may see an improvement in their overall quality of life.  HOW DO YOU QUIT SMOKING? Smoking is an addiction with both physical and psychological effects, and longtime habits can be hard to change. Your health care provider can recommend:  Programs and community resources, which may include group support, education, or therapy.  Replacement products, such as patches, gum, and nasal sprays. Use these products only as directed. Do not replace cigarette smoking with electronic cigarettes (commonly called e-cigarettes). The safety of e-cigarettes is unknown, and some may contain harmful chemicals. FOR MORE INFORMATION  American Lung Association: www.lung.org  American Cancer Society: www.cancer.org   This information is not intended to replace advice  given to you by your health care provider. Make sure you discuss any questions you have with your health care provider.   Document Released: 11/03/2004 Document Revised: 07/17/2013 Document Reviewed: 03/18/2013 Elsevier Interactive Patient Education 2016 [place] WHAT IS SECONDHAND SMOKE? Secondhand smoke is smoke that comes from burning tobacco. It could be the smoke from a cigarette, a pipe, or a cigar. Even if you are not the one smoking, secondhand smoke exposes you to the dangers of smoking. This is called involuntary, or passive, smoking. There are two types of secondhand smoke:  Sidestream smoke is the smoke that comes off the lighted end of a cigarette, pipe, or cigar.  This type of smoke has the highest amount of cancer-causing agents (carcinogens).  The particles in sidestream smoke are smaller. They get into your lungs more easily.  Mainstream smoke is the smoke that is exhaled by a person who is smoking.  This type of smoke is also dangerous to your health. HOW CAN SECONDHAND SMOKE AFFECT MY HEALTH? Studies show that there is no safe level of secondhand smoke. This smoke contains thousands of chemicals. At least 9 of them are known to cause cancer. Secondhand smoke can also cause many other health problems. It has been linked to:  Lung cancer.  Cancer of the voice box (larynx) or throat.  Cancer of the sinuses.  Brain cancer.  Bladder cancer.  Stomach cancer.  Breast cancer.  White blood cell cancers (lymphoma and leukemia).  Brain and liver tumors in children.  Heart disease and stroke in adults.  Pregnancy loss (miscarriage).  Diseases in children, such as:  Asthma.  Lung infections.  Ear infections.  Sudden infant death syndrome (SIDS).  Slow growth. WHERE CAN I BE AT RISK FOR EXPOSURE TO SECONDHAND SMOKE?   For adults, the workplace is the main source of exposure to secondhand smoke.  Your workplace should have a policy  separating smoking areas from nonsmoking areas.  Smoking areas should have a system for ventilating and cleaning the air.  For children, the home may be the most dangerous place for exposure to secondhand smoke.  Children who live in apartment buildings may be at  risk from smoke drifting from hallways or other people's homes.  For everyone, many public places are possible sources of exposure to secondhand smoke.  These places include restaurants, shopping centers, and parks. HOW CAN I REDUCE MY RISK FOR EXPOSURE TO SECONDHAND SMOKE? The most important thing you can do is not smoke. Discourage family members from smoking. Other ways to reduce exposure for you and your family include the following:  Keep your home smoke free.  Make sure your child care providers do not smoke.  Warn your child about the dangers of smoking and secondhand smoke.  Do not allow smoking in your car. When someone smokes in a car, all the damaging chemicals from the smoke are confined in a small area.  Avoid public places where smoking is allowed.   This information is not intended to replace advice given to you by your health care provider. Make sure you discuss any questions you have with your health care provider.   Document Released: 11/03/2004 Document Revised: 10/17/2014 Document Reviewed: 01/10/2014 Elsevier Interactive Patient Education [employer].

## 2015-10-16 NOTE — Assessment & Plan Note (Signed)
Weight tending upwards since he has stopped drinking

## 2015-10-16 NOTE — Assessment & Plan Note (Signed)
Repeat panel has been ordered Prior total cholesterol on his hospital admission October 2016 was more than 300 I suspect lab error

## 2015-10-16 NOTE — Assessment & Plan Note (Signed)
3 vessel coronary artery disease seen on CT scan of the chest from early 2016 Distal RCA disease on most recent CT scan Images reviewed with him in detail He has a typical left chest and arm pain, does not exert himself very much Does not feel that he is able to treadmill We will order a pharmacologic Myoview to rule out ischemia We will be unable to definitively exclude three-vessel disease. This was explained to the patient and his family

## 2015-10-16 NOTE — Assessment & Plan Note (Signed)
Blood pressure is well controlled on today's visit. No changes made to the medications. 

## 2015-10-16 NOTE — Progress Notes (Signed)
Patient ID: Edward Singleton, male    DOB: 11/19/1950, 65 y.o.   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 Review of prior notes also indicate history of C. 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  He presents today with his family They are now controlling his money, medications Previously he would read the taxi to go buy alcohol to get drunk Family controls his finances and his medications  Several hospital admissions in 2016 associated with severe alcohol intoxication  On CT scan review of the chest in early 2016 and more recently of the abdomen and pelvis, He has moderate diffuse aortic atherosclerosis extending to the descending aorta, common iliac arteries, extending to the femoral arteries  He does have three-vessel coronary artery disease These appeared to be small vessels based on the CT scan Images reviewed with him in detail of both scans above  He does report having some left shoulder, left arm pain when walking. He does not walk very far secondary to leg weakness, possible claudication type symptoms.  Review of prior lab work showed acute renal failure that improved on recent blood work 10/01/2015, creatinine 1.41 Previous cholesterol greater than 300, 2 months ago. Interestingly one year ago total cholesterol was 90  EKG on today's visit shows normal sinus rhythm with rate 64 bpm, no significant ST or T-wave changes, APCs noted     No Known Allergies  Current Outpatient  Prescriptions on File Prior to Visit  Medication Sig Dispense Refill  . allopurinol (ZYLOPRIM) 300 MG tablet Take 0.5 tablets (150 mg total) by mouth daily. 90 tablet 1  . feeding supplement, ENSURE ENLIVE, (ENSURE ENLIVE) LIQD Take 237 mLs by mouth 2 (two) times daily with a meal. 123XX123 mL 12  . folic acid (FOLVITE) 1 MG tablet Take 1 tablet (1 mg total) by mouth daily. 90 tablet 1  . hydrochlorothiazide (HYDRODIURIL) 12.5 MG tablet Take 12.5 mg by mouth daily.     . methocarbamol (ROBAXIN) 500 MG tablet Take 1 tablet (500 mg total) by mouth every 6 (six) hours as needed for muscle spasms. 60 tablet 2  . metoprolol succinate (TOPROL-XL) 25 MG 24 hr tablet Take 1 tablet (25 mg total) by mouth daily. 90 tablet 1  . traZODone (DESYREL) 50 MG tablet Take 1 tablet (50 mg total) by mouth at bedtime. 90 tablet 1   No current facility-administered medications on file prior to visit.    Past Medical History  Diagnosis Date  . Hypertension   . Hypercholesteremia   . Gout   . Pelvic fracture ([place]) 11/17/2014  . Benign enlargement of prostate   . Kidney stones   . Atrophic kidney   . Left flank pain, chronic   . 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 Alcohol abuse     Past Surgical History  Procedure Laterality Date  . Orif humerus fracture Left 11/18/2014    Procedure: OPEN REDUCTION INTERNAL FIXATION (ORIF) DISTAL HUMERUS FRACTURE;  Surgeon: Rozanna Box, MD;  Location: [facility];  Service: Orthopedics;  Laterality: Left;  . Lithotripsy    .  Left renal stent placement  2013  . Splenectomy      Social History  reports that he has been smoking Cigarettes.  He has a 50 pack-year smoking history. He has never used smokeless tobacco. He reports that he drinks about 25.2 oz of alcohol per week. He reports that he does not use illicit drugs.  Family History family history includes Diabetes in his father; Heart disease in his father and mother; Nephrolithiasis in his paternal grandfather. There is no  history of Kidney disease or Prostate cancer.   Review of Systems  Constitutional: Negative.   Respiratory: Negative.   Cardiovascular: Positive for chest pain.       Left arm pain  Gastrointestinal: Negative.   Musculoskeletal: Negative.   Skin: Negative.   Neurological: Negative.   Hematological: Negative.   Psychiatric/Behavioral: Negative.   All other systems reviewed and are negative.   BP 144/82 mmHg  Pulse 64  Ht 5\' 7"  (1.702 m)  Wt 141 lb 8 oz (64.184 kg)  BMI 22.16 kg/m2   Physical Exam  Constitutional: He is oriented to person, place, and time. He appears well-developed and well-nourished.  Cold fingers, mottled  HENT:  Head: Normocephalic.  Nose: Nose normal.  Mouth/Throat: Oropharynx is clear and moist.  Eyes: Conjunctivae are normal. Pupils are equal, round, and reactive to light.  Neck: Normal range of motion. Neck supple. No JVD present.  Cardiovascular: Normal rate, regular rhythm, normal heart sounds and intact distal pulses.  Exam reveals no gallop and no friction rub.   No murmur heard. Pulses:      Carotid pulses are 2+ on the right side, and 2+ on the left side.      Radial pulses are 2+ on the right side, and 2+ on the left side.       Femoral pulses are 1+ on the right side, and 1+ on the left side.      Dorsalis pedis pulses are 0 on the right side, and 0 on the left side.       Posterior tibial pulses are 0 on the right side, and 0 on the left side.  Pulmonary/Chest: Effort normal and breath sounds normal. No respiratory distress. He has no wheezes. He has no rales. He exhibits no tenderness.  Abdominal: Soft. Bowel sounds are normal. He exhibits no distension. There is no tenderness.  Musculoskeletal: Normal range of motion. He exhibits no edema or tenderness.  Lymphadenopathy:    He has no cervical adenopathy.  Neurological: He is alert and oriented to person, place, and time. Coordination normal.  Skin: Skin is warm and dry. No rash noted. No  erythema.  Psychiatric: He has a normal mood and affect. His behavior is normal. Judgment and thought content normal.

## 2015-10-17 LAB — LIPID PANEL
CHOL/HDL RATIO: 7.5 ratio — AB (ref 0.0–5.0)
Cholesterol, Total: 331 mg/dL — ABNORMAL HIGH (ref 100–199)
HDL: 44 mg/dL (ref >39)
LDL CALC: 234 mg/dL — AB (ref 0–99)
Triglycerides: 264 mg/dL — ABNORMAL HIGH (ref 0–149)
VLDL CHOLESTEROL CAL: 53 mg/dL — AB (ref 5–40)

## 2015-10-17 LAB — HEPATIC FUNCTION PANEL
ALK PHOS: 166 IU/L — AB (ref 39–117)
ALT: 10 IU/L (ref 0–44)
AST: 20 IU/L (ref 0–40)
Albumin: 4.7 g/dL (ref 3.6–4.8)
BILIRUBIN TOTAL: 0.5 mg/dL (ref 0.0–1.2)
BILIRUBIN, DIRECT: 0.14 mg/dL (ref 0.00–0.40)
Total Protein: 7.4 g/dL (ref 6.0–8.5)

## 2015-10-19 ENCOUNTER — Other Ambulatory Visit: Payer: Self-pay

## 2015-10-20 ENCOUNTER — Other Ambulatory Visit: Payer: Self-pay | Admitting: Cardiovascular Disease

## 2015-10-20 ENCOUNTER — Other Ambulatory Visit: Payer: Self-pay

## 2015-10-20 ENCOUNTER — Telehealth: Payer: Self-pay

## 2015-10-20 DIAGNOSIS — E785 Hyperlipidemia, unspecified: Secondary | ICD-10-CM

## 2015-10-20 DIAGNOSIS — I739 Peripheral vascular disease, unspecified: Secondary | ICD-10-CM

## 2015-10-20 DIAGNOSIS — I2584 Coronary atherosclerosis due to calcified coronary lesion: Principal | ICD-10-CM

## 2015-10-20 DIAGNOSIS — I2510 Atherosclerotic heart disease of native coronary artery without angina pectoris: Secondary | ICD-10-CM

## 2015-10-20 DIAGNOSIS — M79603 Pain in arm, unspecified: Secondary | ICD-10-CM

## 2015-10-20 MED ORDER — ROSUVASTATIN CALCIUM 10 MG PO TABS: 10.0000 mg | ORAL_TABLET | Freq: Every day | ORAL | Status: DC

## 2015-10-20 NOTE — Telephone Encounter (Signed)
Rx for Crestor 10 mg sent to [facility], 90 day supply.

## 2015-10-20 NOTE — Telephone Encounter (Signed)
Pt sister, [name], called states that a new rx for Crestor was to be called in yesterday to [facility]. States [facility] just left [facility], and there is no rx for Crestor there. Please call and advise.

## 2015-10-21 ENCOUNTER — Other Ambulatory Visit: Payer: Self-pay | Admitting: Cardiovascular Disease

## 2015-10-21 DIAGNOSIS — I739 Peripheral vascular disease, unspecified: Secondary | ICD-10-CM

## 2015-10-22 ENCOUNTER — Ambulatory Visit
Admission: RE | Admit: 2015-10-22 | Discharge: 2015-10-22 | Disposition: A | Discharge status: Home / Self Care | Payer: BLUE CROSS/BLUE SHIELD | Source: Ambulatory Visit | Attending: Cardiovascular Disease | Admitting: Cardiovascular Disease

## 2015-10-22 DIAGNOSIS — I2584 Coronary atherosclerosis due to calcified coronary lesion: Secondary | ICD-10-CM | POA: Diagnosis not present

## 2015-10-22 DIAGNOSIS — E785 Hyperlipidemia, unspecified: Secondary | ICD-10-CM

## 2015-10-22 DIAGNOSIS — I2510 Atherosclerotic heart disease of native coronary artery without angina pectoris: Secondary | ICD-10-CM | POA: Diagnosis not present

## 2015-10-22 DIAGNOSIS — M79603 Pain in arm, unspecified: Secondary | ICD-10-CM | POA: Insufficient documentation

## 2015-10-22 MED ORDER — REGADENOSON 0.4 MG/5ML IV SOLN
0.4000 mg | Freq: Once | INTRAVENOUS | Status: AC
  Administered 2015-10-22: 0.4 mg via INTRAVENOUS

## 2015-10-22 MED ORDER — TECHNETIUM TC 99M SESTAMIBI - CARDIOLITE
10.0000 | Freq: Once | INTRAVENOUS | Status: AC | PRN
  Administered 2015-10-22: 08:00:00 13.57 via INTRAVENOUS

## 2015-10-22 MED ORDER — TECHNETIUM TC 99M SESTAMIBI - CARDIOLITE
32.9460 | Freq: Once | INTRAVENOUS | Status: AC | PRN
  Administered 2015-10-22: 09:00:00 32.946 via INTRAVENOUS

## 2015-10-23 LAB — NM MYOCAR MULTI W/SPECT W/WALL MOTION / EF
CHL CUP NUCLEAR SDS: 0
CHL CUP RESTING HR STRESS: 55 {beats}/min
LV sys vol: 31 mL
LVDIAVOL: 74 mL
Peak HR: 87 {beats}/min
Percent HR: 55 %
SRS: 3
SSS: 3
TID: 0.94

## 2015-10-28 ENCOUNTER — Ambulatory Visit: Payer: BLUE CROSS/BLUE SHIELD

## 2015-10-28 DIAGNOSIS — I739 Peripheral vascular disease, unspecified: Secondary | ICD-10-CM | POA: Diagnosis not present

## 2015-10-28 DIAGNOSIS — I2510 Atherosclerotic heart disease of native coronary artery without angina pectoris: Secondary | ICD-10-CM

## 2015-10-28 DIAGNOSIS — M79603 Pain in arm, unspecified: Secondary | ICD-10-CM

## 2015-10-28 DIAGNOSIS — I2584 Coronary atherosclerosis due to calcified coronary lesion: Principal | ICD-10-CM

## 2015-10-28 DIAGNOSIS — E785 Hyperlipidemia, unspecified: Secondary | ICD-10-CM

## 2015-11-05 ENCOUNTER — Ambulatory Visit (INDEPENDENT_AMBULATORY_CARE_PROVIDER_SITE_OTHER): Payer: BLUE CROSS/BLUE SHIELD | Admitting: Family Medicine

## 2015-11-05 ENCOUNTER — Encounter: Payer: Self-pay | Admitting: Family Medicine

## 2015-11-05 VITALS — BP 144/96 | HR 62 | Temp 97.7°F | Ht 67.0 in | Wt 145.2 lb

## 2015-11-05 DIAGNOSIS — F39 Unspecified mood [affective] disorder: Secondary | ICD-10-CM

## 2015-11-05 DIAGNOSIS — F1010 Alcohol abuse, uncomplicated: Secondary | ICD-10-CM | POA: Diagnosis not present

## 2015-11-05 DIAGNOSIS — I10 Essential (primary) hypertension: Secondary | ICD-10-CM | POA: Diagnosis not present

## 2015-11-05 DIAGNOSIS — Z720 Tobacco use: Secondary | ICD-10-CM

## 2015-11-05 DIAGNOSIS — D649 Anemia, unspecified: Secondary | ICD-10-CM

## 2015-11-05 DIAGNOSIS — I739 Peripheral vascular disease, unspecified: Secondary | ICD-10-CM

## 2015-11-05 DIAGNOSIS — E785 Hyperlipidemia, unspecified: Secondary | ICD-10-CM | POA: Diagnosis not present

## 2015-11-05 DIAGNOSIS — I25118 Atherosclerotic heart disease of native coronary artery with other forms of angina pectoris: Secondary | ICD-10-CM

## 2015-11-05 DIAGNOSIS — R4586 Emotional lability: Secondary | ICD-10-CM

## 2015-11-05 MED ORDER — ESCITALOPRAM OXALATE 20 MG PO TABS
20.0000 mg | ORAL_TABLET | Freq: Every day | ORAL | Status: DC
Start: 2015-11-05 — End: 2015-11-06

## 2015-11-05 NOTE — Patient Instructions (Signed)
Increase the crestor to 20 mg daily (2 tablets).  Start Aspirin 81 mg daily.  Take the Lexapro as prescribed.  Follow up in 1 month.  Take care   Dr. Lacinda Axon

## 2015-11-06 ENCOUNTER — Emergency Department
Admission: EM | Admit: 2015-11-06 | Discharge: 2015-11-06 | Disposition: A | Discharge status: Home / Self Care | Payer: BLUE CROSS/BLUE SHIELD | Attending: Emergency Medicine | Admitting: Emergency Medicine

## 2015-11-06 ENCOUNTER — Encounter: Payer: Self-pay | Admitting: Emergency Medicine

## 2015-11-06 DIAGNOSIS — R944 Abnormal results of kidney function studies: Secondary | ICD-10-CM | POA: Diagnosis present

## 2015-11-06 DIAGNOSIS — F17210 Nicotine dependence, cigarettes, uncomplicated: Secondary | ICD-10-CM | POA: Insufficient documentation

## 2015-11-06 DIAGNOSIS — I129 Hypertensive chronic kidney disease with stage 1 through stage 4 chronic kidney disease, or unspecified chronic kidney disease: Secondary | ICD-10-CM | POA: Diagnosis not present

## 2015-11-06 DIAGNOSIS — N289 Disorder of kidney and ureter, unspecified: Secondary | ICD-10-CM | POA: Diagnosis not present

## 2015-11-06 DIAGNOSIS — N183 Chronic kidney disease, stage 3 (moderate): Secondary | ICD-10-CM | POA: Diagnosis not present

## 2015-11-06 DIAGNOSIS — Z720 Tobacco use: Secondary | ICD-10-CM | POA: Insufficient documentation

## 2015-11-06 DIAGNOSIS — R4586 Emotional lability: Secondary | ICD-10-CM | POA: Insufficient documentation

## 2015-11-06 LAB — BASIC METABOLIC PANEL
Anion gap: 9 (ref 5–15)
BUN: 16 mg/dL (ref 6–20)
CHLORIDE: 98 mmol/L — AB (ref 101–111)
CO2: 27 mmol/L (ref 22–32)
CREATININE: 1.51 mg/dL — AB (ref 0.61–1.24)
Calcium: 9.8 mg/dL (ref 8.9–10.3)
GFR calc Af Amer: 55 mL/min — ABNORMAL LOW (ref >60)
GFR calc non Af Amer: 47 mL/min — ABNORMAL LOW (ref >60)
GLUCOSE: 106 mg/dL — AB (ref 65–99)
Potassium: 4.6 mmol/L (ref 3.5–5.1)
Sodium: 134 mmol/L — ABNORMAL LOW (ref 135–145)

## 2015-11-06 LAB — URINALYSIS COMPLETE WITH MICROSCOPIC (ARMC ONLY)
BACTERIA UA: NONE SEEN
BILIRUBIN URINE: NEGATIVE
Glucose, UA: NEGATIVE mg/dL
Hgb urine dipstick: NEGATIVE
KETONES UR: NEGATIVE mg/dL
Leukocytes, UA: NEGATIVE
NITRITE: NEGATIVE
PROTEIN: NEGATIVE mg/dL
SPECIFIC GRAVITY, URINE: 1.006 (ref 1.005–1.030)
SQUAMOUS EPITHELIAL / LPF: NONE SEEN
WBC UA: NONE SEEN WBC/hpf (ref 0–5)
pH: 5 (ref 5.0–8.0)

## 2015-11-06 LAB — CBC
HEMATOCRIT: 45.2 % (ref 39.0–52.0)
HEMOGLOBIN: 14.8 g/dL (ref 13.0–17.0)
MCHC: 32.8 g/dL (ref 30.0–36.0)
MCV: 100.4 fl — AB (ref 78.0–100.0)
Platelets: 457 10*3/uL — ABNORMAL HIGH (ref 150.0–400.0)
RBC: 4.51 Mil/uL (ref 4.22–5.81)
RDW: 14.6 % (ref 11.5–15.5)
WBC: 14.1 10*3/uL — AB (ref 4.0–10.5)

## 2015-11-06 LAB — COMPREHENSIVE METABOLIC PANEL
ALT: 12 U/L (ref 0–53)
AST: 18 U/L (ref 0–37)
Albumin: 4.5 g/dL (ref 3.5–5.2)
Alkaline Phosphatase: 135 U/L — ABNORMAL HIGH (ref 39–117)
BUN: 19 mg/dL (ref 6–23)
CALCIUM: 10 mg/dL (ref 8.4–10.5)
CHLORIDE: 95 meq/L — AB (ref 96–112)
CO2: 28 meq/L (ref 19–32)
CREATININE: 1.74 mg/dL — AB (ref 0.40–1.50)
GFR: 42.25 mL/min — AB (ref >60.00)
GLUCOSE: 114 mg/dL — AB (ref 70–99)
Potassium: 4.9 mEq/L (ref 3.5–5.1)
Sodium: 132 mEq/L — ABNORMAL LOW (ref 135–145)
Total Bilirubin: 0.4 mg/dL (ref 0.2–1.2)
Total Protein: 7.7 g/dL (ref 6.0–8.3)

## 2015-11-06 LAB — CBC WITH DIFFERENTIAL/PLATELET
Basophils Absolute: 0.1 10*3/uL (ref 0–0.1)
Basophils Relative: 1 %
Eosinophils Absolute: 0.3 10*3/uL (ref 0–0.7)
Eosinophils Relative: 3 %
HEMATOCRIT: 43.6 % (ref 40.0–52.0)
HEMOGLOBIN: 14.6 g/dL (ref 13.0–18.0)
LYMPHS ABS: 4.1 10*3/uL — AB (ref 1.0–3.6)
Lymphocytes Relative: 35 %
MCH: 32.6 pg (ref 26.0–34.0)
MCHC: 33.5 g/dL (ref 32.0–36.0)
MCV: 97.5 fL (ref 80.0–100.0)
MONO ABS: 1.3 10*3/uL — AB (ref 0.2–1.0)
MONOS PCT: 11 %
NEUTROS ABS: 5.9 10*3/uL (ref 1.4–6.5)
NEUTROS PCT: 50 %
Platelets: 413 10*3/uL (ref 150–440)
RBC: 4.47 MIL/uL (ref 4.40–5.90)
RDW: 14.2 % (ref 11.5–14.5)
WBC: 11.7 10*3/uL — ABNORMAL HIGH (ref 3.8–10.6)

## 2015-11-06 LAB — IRON: IRON: 76 ug/dL (ref 42–165)

## 2015-11-06 LAB — IRON AND TIBC
%SAT: 18 % (ref 15–60)
Iron: 73 ug/dL (ref 50–180)
TIBC: 410 ug/dL (ref 250–425)
UIBC: 337 ug/dL (ref 125–400)

## 2015-11-06 LAB — IBC PANEL
IRON: 76 ug/dL (ref 42–165)
SATURATION RATIOS: 15.9 % — AB (ref 20.0–50.0)
TRANSFERRIN: 342 mg/dL (ref 212.0–360.0)

## 2015-11-06 LAB — FERRITIN: Ferritin: 42.8 ng/mL (ref 22.0–322.0)

## 2015-11-06 MED ORDER — ESCITALOPRAM OXALATE 20 MG PO TABS: 10.0000 mg | ORAL_TABLET | Freq: Every day | ORAL | Status: DC

## 2015-11-06 NOTE — Assessment & Plan Note (Signed)
Sober doing well at this time.

## 2015-11-06 NOTE — ED Provider Notes (Signed)
[facility] Emergency Department Provider Note     Time seen: ----------------------------------------- 5:46 PM on 11/06/2015 -----------------------------------------    I have reviewed the triage vital signs and the nursing notes.   HISTORY  Chief Complaint Abnormal Lab    HPI Edward Singleton is a 65 y.o. male who presents ER due to abnormal lab work that was checked as an outpatient. Patient was told to come to the ER because his creatinine was 1.7 the sodium was 131. Patient reports to feeling fine and he has no complaints. They do note he was placed on high score thiazide around Christmas time for his blood pressure. Again patient has no complaints here.   Past Medical History  Diagnosis Date  . Hypertension   . Hypercholesteremia   . Gout   . Pelvic fracture ([place]) 11/17/2014  . Benign enlargement of prostate   . Kidney stones   . Atrophic kidney   . Left flank pain, chronic   . 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 Alcohol abuse     Patient Active Problem List   Diagnosis Date Noted  . Tobacco abuse 11/06/2015  . Mood disturbance ([place]) 11/06/2015  . CAD (coronary artery disease) 10/16/2015  . PAD (peripheral artery disease) ([place]) 10/16/2015  . Preventative health care 08/13/2015  . Renal artery stenosis ([place]) 07/30/2015  . Kidney atrophy 07/30/2015  . CKD (chronic kidney disease), stage III 07/30/2015  . Insomnia 07/22/2015  . Anemia 07/15/2015  . History of substance abuse 07/15/2015  . BPH (benign prostatic hyperplasia) 07/15/2015  . Gout 07/15/2015  . Kidney stones 05/11/2015  . 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 HTN (hypertension) 11/17/2014  . Alcohol abuse 11/17/2014  . Dyslipidemia 11/17/2014  . History of nonadherence to medical treatment 11/17/2014    Past Surgical History  Procedure Laterality Date  . Orif humerus fracture Left 11/18/2014    Procedure: OPEN REDUCTION INTERNAL FIXATION (ORIF) DISTAL HUMERUS  FRACTURE;  Surgeon: Rozanna Box, MD;  Location: [facility];  Service: Orthopedics;  Laterality: Left;  . Lithotripsy    . Left renal stent placement  2013  . Splenectomy      Allergies Review of patient's allergies indicates no known allergies.  Social History Social History  Substance Use Topics  . Smoking status: Current Every Day Smoker -- 1.00 packs/day for 50 years    Types: Cigarettes  . Smokeless tobacco: Never Used  . Alcohol Use: No     Comment: No alcohol since Oct. 11, 2016    Review of Systems Constitutional: Negative for fever. Eyes: Negative for visual changes. ENT: Negative for sore throat. Cardiovascular: Negative for chest pain. Respiratory: Negative for shortness of breath. Gastrointestinal: Negative for abdominal pain, vomiting and diarrhea. Genitourinary: Negative for dysuria. Musculoskeletal: Negative for back pain. Skin: Negative for rash. Neurological: Negative for headaches, focal weakness or numbness.  10-point ROS otherwise negative.  ____________________________________________   PHYSICAL EXAM:  VITAL SIGNS: ED Triage Vitals  Enc Vitals Group     BP 11/06/15 1639 174/88 mmHg     Pulse Rate 11/06/15 1639 60     Resp 11/06/15 1639 18     Temp 11/06/15 1639 98.1 F (36.7 C)     Temp Source 11/06/15 1639 Oral     SpO2 11/06/15 1639 97 %     Weight 11/06/15 1639 150 lb (68.04 kg)     Height 11/06/15 1639 5\' 8"  (1.727 m)     Head Cir --  Peak Flow --      Pain Score --      Pain Loc --      Pain Edu? --      Excl. in [place]? --     Constitutional: Alert and oriented. Well appearing and in no distress. Eyes: Conjunctivae are normal. PERRL. Normal extraocular movements. ENT   Head: Normocephalic and atraumatic.   Nose: No congestion/rhinnorhea.   Mouth/Throat: Mucous membranes are moist.   Neck: No stridor. Cardiovascular: Normal rate, regular rhythm. Normal and symmetric distal pulses are present in all extremities. No  murmurs, rubs, or gallops. Respiratory: Normal respiratory effort without tachypnea nor retractions. Breath sounds are clear and equal bilaterally. No wheezes/rales/rhonchi. Gastrointestinal: Soft and nontender. No distention. No abdominal bruits.  Musculoskeletal: Nontender with normal range of motion in all extremities. No joint effusions.  No lower extremity tenderness nor edema. Neurologic:  Normal speech and language. No gross focal neurologic deficits are appreciated. Speech is normal. No gait instability. Skin:  Skin is warm, dry and intact. No rash noted. Psychiatric: Mood and affect are normal. Speech and behavior are normal. Patient exhibits appropriate insight and judgment. ____________________________________________  ED COURSE:  Pertinent labs & imaging results that were available during my care of the patient were reviewed by me and considered in my medical decision making (see chart for details). Patient is in no acute distress, will recheck his labs before treatment. ____________________________________________    LABS (pertinent positives/negatives)  Labs Reviewed  CBC WITH DIFFERENTIAL/PLATELET - Abnormal; Notable for the following:    WBC 11.7 (*)    Lymphs Abs 4.1 (*)    Monocytes Absolute 1.3 (*)    All other components within normal limits  BASIC METABOLIC PANEL - Abnormal; Notable for the following:    Sodium 134 (*)    Chloride 98 (*)    Glucose, Bld 106 (*)    Creatinine, Ser 1.51 (*)    GFR calc non Af Amer 47 (*)    GFR calc Af Amer 55 (*)    All other components within normal limits  URINALYSIS COMPLETEWITH MICROSCOPIC ([facility] ONLY) - Abnormal; Notable for the following:    Color, Urine STRAW (*)    APPearance CLEAR (*)    All other components within normal limits   ____________________________________________  FINAL ASSESSMENT AND PLAN  Mild renal insufficiency  Plan: Patient with labs and imaging as dictated above. Patient's labs are improved  compared to prior. I have advised him to hold the hctz all and increase his fluid intake. He is advised to have a recheck as scheduled next week if his labs.   Earleen Newport, MD   Earleen Newport, MD 11/06/15 [phone]

## 2015-11-06 NOTE — Assessment & Plan Note (Signed)
Stable. Likely benefit from addition of ACEI. Defer to Renal.

## 2015-11-06 NOTE — Assessment & Plan Note (Signed)
Starting patient on baby aspirin.

## 2015-11-06 NOTE — Assessment & Plan Note (Signed)
Increasing Crestor 20 mg daily.

## 2015-11-06 NOTE — Discharge Instructions (Signed)
Renal Insufficiency Chronic kidney disease occurs when the kidneys are damaged over a long period. The kidneys are two organs that lie on either side of the spine between the middle of the back and the front of the abdomen. The kidneys:  Remove wastes and extra water from the blood.  Produce important hormones. These help keep bones strong, regulate blood pressure, and help create red blood cells.  Balance the fluids and chemicals in the blood and tissues. A small amount of kidney damage may not cause problems, but a large amount of damage may make it difficult or impossible for the kidneys to work the way they should. If steps are not taken to slow down the kidney damage or stop it from getting worse, the kidneys may stop working permanently. Most of the time, chronic kidney disease does not go away. However, it can often be controlled, and those with the disease can usually live normal lives. CAUSES The most common causes of chronic kidney disease are diabetes and high blood pressure (hypertension). Chronic kidney disease may also be caused by:  Diseases that cause the kidneys' filters to become inflamed.  Diseases that affect the immune system.  Genetic diseases.  Medicines that damage the kidneys, such as anti-inflammatory medicines.  Poisoning or exposure to toxic substances.  A reoccurring kidney or urinary infection.  A problem with urine flow. This may be caused by:  Cancer.  Kidney stones.  An enlarged prostate in males. SIGNS AND SYMPTOMS Because the kidney damage in chronic kidney disease occurs slowly, symptoms develop slowly and may not be obvious until the kidney damage becomes severe. A person may have a kidney disease for years without showing any symptoms. Symptoms can include:  Swelling (edema) of the legs, ankles, or feet.  Tiredness (lethargy).  Nausea or vomiting.  Confusion.  Problems with urination, such as:  Decreased urine production.  Frequent  urination, especially at night.  Frequent accidents in children who are potty trained.  Muscle twitches and cramps.  Shortness of breath.  Weakness.  Persistent itchiness.  Loss of appetite.  Metallic taste in the mouth.  Trouble sleeping.  Slowed development in children.  Short stature in children. DIAGNOSIS Chronic kidney disease may be detected and diagnosed by tests, including blood, urine, imaging, or kidney biopsy tests. TREATMENT Most chronic kidney diseases cannot be cured. Treatment usually involves relieving symptoms and preventing or slowing the progression of the disease. Treatment may include:  A special diet. You may need to avoid alcohol and foods thatare salty and high in potassium.  Medicines. These may:  Lower blood pressure.  Relieve anemia.  Relieve swelling.  Protect the bones. HOME CARE INSTRUCTIONS  Follow your prescribed diet. Your health care provider may instruct you to limit daily salt (sodium) and protein intake.  Take medicines only as directed by your health care provider. Do not take any new medicines (prescription, over-the-counter, or nutritional supplements) unless approved by your health care provider. Many medicines can worsen your kidney damage or need to have the dose adjusted.   Quit smoking if you smoke. Talk to your health care provider about a smoking cessation program.  Keep all follow-up visits as directed by your health care provider.  Monitor your blood pressure.  Start or continue an exercise plan.  Get immunizations as directed by your health care provider.  Take vitamin and mineral supplements as directed by your health care provider. SEEK IMMEDIATE MEDICAL CARE IF:  Your symptoms get worse or you develop new  symptoms.  You develop symptoms of end-stage kidney disease. These include:  Headaches.  Abnormally dark or light skin.  Numbness in the hands or feet.  Easy bruising.  Frequent  hiccups.  Menstruation stops.  You have a fever.  You have decreased urine production.  You havepain or bleeding when urinating. MAKE SURE YOU:  Understand these instructions.  Will watch your condition.  Will get help right away if you are not doing well or get worse. FOR MORE INFORMATION   American Association of Kidney Patients: BombTimer.gl  National Kidney Foundation: www.kidney.[place]: https://mathis.com/  Life Options Rehabilitation Program: www.lifeoptions.org and www.kidneyschool.org   This information is not intended to replace advice given to you by your health care provider. Make sure you discuss any questions you have with your health care provider.   Document Released: 07/05/2008 Document Revised: 10/17/2014 Document Reviewed: 05/25/2012 Elsevier Interactive Patient Education [employer].

## 2015-11-06 NOTE — Assessment & Plan Note (Signed)
Asymptomatic. Starting daily aspirin.

## 2015-11-06 NOTE — Assessment & Plan Note (Signed)
Clinically, patient is exhibiting signs of depression. This is complicated by the fact that he has dementia due to alcohol abuse. After discussion with patient and sister, will start patient on SSRI.

## 2015-11-06 NOTE — Assessment & Plan Note (Signed)
Uninterested in quitting at this time. I encouraged patient to reconsider. He states that he will consider.

## 2015-11-06 NOTE — Progress Notes (Signed)
Subjective:  Patient ID: Edward Singleton, male    DOB: October 13, 1950  Age: 65 y.o. MRN: [id number]  CC: Follow up chronic issues, Irritability/mood disturbance  HPI:  65 year old male with a complicated past medical history presents for follow-up. Concerns/issues are below.  Alcohol abuse  Patient sober and doing well.  HLD  Patient with recent lipid panel. Cholesterol markedly elevated with an LDL of 234.  Was recently started on Crestor.  Tolerating without difficulty.  CAD  Patient has seen cardiology following CT was revealed coronary artery calcification.  Patient has three-vessel disease based on CT.  He's had recent stress test which has been negative.  Interestingly, patient is not on aspirin.  PAD  Recent ultrasounds and cardiology revealed PAD but no focal lesion that needed to be addressed.  Tobacco abuse  Patient to use to smoke a pack a day.  Discussed quitting today patient states that he's going to continue to smoke until he dies.  He is not interested in cessation at this time.  HTN  Stable on HCTZ and metoprolol.   Mood disturbance/irritability  Sister has noted a recent change in his mood.  Patient also acknowledges this.  They report that he's more irritable and angry.  He is short tempered and complex recently started.  He states that he bored and is not able to do much.  He seems to have some animosity towards his family around the fact that they are controlling his money/medication/etc.  He states that he's not quite sure why he is not his normal self.  No recent changes. No known inciting factor.  Social Hx   Social History   Social History  . Marital Status: Single    Spouse Name: N/A  . Number of Children: N/A  . Years of Education: N/A   Social History Main Topics  . Smoking status: Current Every Day Smoker -- 1.00 packs/day for 50 years    Types: Cigarettes  . Smokeless tobacco: Never Used  . Alcohol Use: 25.2  oz/week    42 Cans of beer, 0 Standard drinks or equivalent per week     Comment: No alcohol since Oct. 11, 2016  . Drug Use: No  . Sexual Activity: Not Asked   Other Topics Concern  . None   Social History Narrative   Review of Systems  Respiratory: Negative for shortness of breath.   Cardiovascular: Negative for chest pain.  Musculoskeletal: Positive for neck pain.       Shoulder pain.  Psychiatric/Behavioral: Positive for agitation.   Objective:  BP 144/96 mmHg  Pulse 62  Temp(Src) 97.7 F (36.5 C) (Oral)  Ht _0  (1.702 m)  Wt 145 lb 4 oz (65.885 kg)  BMI 22.74 kg/m2  SpO2 98%  BP/Weight 11/05/2015 10/16/2015 15/40/0867  Systolic BP 619 509 326  Diastolic BP 96 82 98  Wt. (Lbs) 145.25 141.5 -  BMI 22.74 22.16 -   Physical Exam  Constitutional: He appears well-developed. No distress.  Cardiovascular: Normal rate and regular rhythm.   Pulmonary/Chest: Effort normal and breath sounds normal.  Abdominal: Soft. He exhibits no distension. There is no tenderness. There is no rebound and no guarding.  Neurological: He is alert.  Psychiatric:  Agitated/irritable particularly with discussing family.  Vitals reviewed.  Lab Results  Component Value Date   WBC 12.2 08/11/2015   HGB 9.8* 08/11/2015   HCT 30* 08/11/2015   PLT 948* 08/11/2015   GLUCOSE 89 07/30/2015   CHOL 331* 10/16/2015  TRIG 264* 10/16/2015   HDL 44 10/16/2015   LDLDIRECT 230.0 07/22/2015   LDLCALC 234* 10/16/2015   ALT 10 10/16/2015   AST 20 10/16/2015   NA 136* 08/11/2015   K 5.7* 08/11/2015   CL 96 07/30/2015   CREATININE 1.1 08/11/2015   BUN 9 08/11/2015   CO2 27 07/30/2015   TSH 1.880 11/17/2014   INR 0.93 03/24/2015   HGBA1C 5.6 01/15/2014   Assessment & Plan:   Problem List Items Addressed This Visit    Alcohol abuse - Primary    Sober doing well at this time.      Anemia   Relevant Orders   CBC   Iron   Ferritin   Iron Binding Cap (TIBC)   IBC panel   CAD (coronary  artery disease)    Starting patient on baby aspirin.      Dyslipidemia    Increasing Crestor 20 mg daily.      HTN (hypertension)    Stable. Likely benefit from addition of ACEI. Defer to Renal.      Relevant Orders   Comp Met (CMET)   Mood disturbance ([place])    Clinically, patient is exhibiting signs of depression. This is complicated by the fact that he has dementia due to alcohol abuse. After discussion with patient and sister, will start patient on SSRI.       PAD (peripheral artery disease) ([facility])    Asymptomatic. Starting daily aspirin.      Tobacco abuse    Uninterested in quitting at this time. I encouraged patient to reconsider. He states that he will consider.        Meds ordered this encounter  Medications  . DISCONTD: escitalopram (LEXAPRO) 20 MG tablet    Sig: Take 1 tablet (20 mg total) by mouth daily.    Dispense:  90 tablet    Refill:  1  . escitalopram (LEXAPRO) 20 MG tablet    Sig: Take 0.5 tablets (10 mg total) by mouth daily.    Dispense:  90 tablet    Refill:  1   Follow-up: 1 month  [place]

## 2015-11-06 NOTE — ED Notes (Signed)
Pt went to have labs drawn for routine check up yesterday.  Was told to come to ED for creatinine of 1.7 and NA of 131 per wife.  Pt reports has been feeling fine, no symptoms. Denies urinary sx.

## 2015-11-11 ENCOUNTER — Other Ambulatory Visit: Payer: Self-pay | Admitting: Family Medicine

## 2015-11-11 ENCOUNTER — Other Ambulatory Visit (INDEPENDENT_AMBULATORY_CARE_PROVIDER_SITE_OTHER): Payer: BLUE CROSS/BLUE SHIELD

## 2015-11-11 DIAGNOSIS — I10 Essential (primary) hypertension: Secondary | ICD-10-CM | POA: Diagnosis not present

## 2015-11-11 LAB — BASIC METABOLIC PANEL
BUN: 21 mg/dL (ref 6–23)
CHLORIDE: 99 meq/L (ref 96–112)
CO2: 25 mEq/L (ref 19–32)
Calcium: 10.1 mg/dL (ref 8.4–10.5)
Creatinine, Ser: 1.46 mg/dL (ref 0.40–1.50)
GFR: 51.73 mL/min — ABNORMAL LOW (ref >60.00)
Glucose, Bld: 100 mg/dL — ABNORMAL HIGH (ref 70–99)
POTASSIUM: 5 meq/L (ref 3.5–5.1)
Sodium: 136 mEq/L (ref 135–145)

## 2015-11-12 ENCOUNTER — Ambulatory Visit (INDEPENDENT_AMBULATORY_CARE_PROVIDER_SITE_OTHER): Payer: BLUE CROSS/BLUE SHIELD | Admitting: Cardiovascular Disease

## 2015-11-12 ENCOUNTER — Encounter: Payer: Self-pay | Admitting: Cardiovascular Disease

## 2015-11-12 VITALS — BP 170/100 | HR 58 | Ht 67.0 in | Wt 145.0 lb

## 2015-11-12 DIAGNOSIS — Z720 Tobacco use: Secondary | ICD-10-CM

## 2015-11-12 DIAGNOSIS — I739 Peripheral vascular disease, unspecified: Secondary | ICD-10-CM | POA: Diagnosis not present

## 2015-11-12 DIAGNOSIS — I150 Renovascular hypertension: Secondary | ICD-10-CM | POA: Diagnosis not present

## 2015-11-12 DIAGNOSIS — I701 Atherosclerosis of renal artery: Secondary | ICD-10-CM

## 2015-11-12 MED ORDER — AMLODIPINE BESYLATE 5 MG PO TABS: 5.0000 mg | ORAL_TABLET | Freq: Every day | ORAL | Status: DC

## 2015-11-12 NOTE — Assessment & Plan Note (Signed)
This patient has known history of renal artery stenosis status post left renal artery stenting in 2013 by Dr. dew. Currently his blood pressure is running very high and he did have recent worsening of renal function. Thus, I think it's important to rule out progression of renal artery stenosis. Thus, I requested a renal artery duplex.

## 2015-11-12 NOTE — Assessment & Plan Note (Signed)
I strongly advised him to quit smoking.

## 2015-11-12 NOTE — Progress Notes (Signed)
Primary care physician: Dr. Lacinda Axon Primary cardiologist: Dr. Rockey Situ  HPI  This is a pleasant 65 year old male who was referred by Dr.Gollan for evaluation of peripheral arterial disease. The patient has history of three-vessel coronary artery calcifications noted on CT scan, alcohol abuse with multiple hospitalizations in 2016 for intoxication, chronic kidney disease, history of traumatic left arm injury, tobacco use, C. difficile diarrhea, hypertension and possible dementia. He had left renal artery stent placement in 2013. CT scan showed aortic calcifications and thus the patient underwent aortoiliac duplex which showed evidence of severe bilateral common iliac artery stenosis with a peak velocity around 450.  He denies any claudication. No lower back pain. He reports no limitations with physical activities. Nuclear stress test last month showed no evidence of ischemia and overall was low risk. He does have chronic kidney disease and hypertension. He was started on hydrochlorothiazide by Dr. Juleen China. The patient was noted to have worsening renal function on routine testing and was sent to the emergency room recently. Hydrochlorothiazide was discontinued. His blood pressure is now running high.  No Known Allergies   Current Outpatient Prescriptions on File Prior to Visit  Medication Sig Dispense Refill  . allopurinol (ZYLOPRIM) 300 MG tablet Take 0.5 tablets (150 mg total) by mouth daily. 90 tablet 1  . escitalopram (LEXAPRO) 20 MG tablet Take 0.5 tablets (10 mg total) by mouth daily. 90 tablet 1  . folic acid (FOLVITE) 1 MG tablet Take 1 tablet (1 mg total) by mouth daily. 90 tablet 1  . metoprolol succinate (TOPROL-XL) 25 MG 24 hr tablet Take 1 tablet (25 mg total) by mouth daily. 90 tablet 1  . rosuvastatin (CRESTOR) 10 MG tablet Take 1 tablet (10 mg total) by mouth daily. 90 tablet 3  . traZODone (DESYREL) 50 MG tablet Take 1 tablet (50 mg total) by mouth at bedtime. 90 tablet 1   No  current facility-administered medications on file prior to visit.     Past Medical History  Diagnosis Date  . Hypertension   . Hypercholesteremia   . Gout   . Pelvic fracture ([place]) 11/17/2014  . Benign enlargement of prostate   . Kidney stones   . Atrophic kidney   . Left flank pain, chronic   . 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 Alcohol abuse      Past Surgical History  Procedure Laterality Date  . Orif humerus fracture Left 11/18/2014    Procedure: OPEN REDUCTION INTERNAL FIXATION (ORIF) DISTAL HUMERUS FRACTURE;  Surgeon: Rozanna Box, MD;  Location: [facility];  Service: Orthopedics;  Laterality: Left;  . Lithotripsy    . Left renal stent placement  2013  . Splenectomy       Family History  Problem Relation Age of Onset  . Nephrolithiasis Paternal Grandfather   . Kidney disease Neg Hx   . Prostate cancer Neg Hx   . Heart disease Mother   . Heart disease Father   . Diabetes Father      Social History   Social History  . Marital Status: Single    Spouse Name: N/A  . Number of Children: N/A  . Years of Education: N/A   Occupational History  . Not on file.   Social History Main Topics  . Smoking status: Current Every Day Smoker -- 1.00 packs/day for 50 years    Types: Cigarettes  . Smokeless tobacco: Never Used  . Alcohol Use: No     Comment: No alcohol since Oct.  11, 2016  . Drug Use: No  . Sexual Activity: Not on file   Other Topics Concern  . Not on file   Social History Narrative     ROS A 10 point review of system was performed. It is negative other than that mentioned in the history of present illness.   PHYSICAL EXAM   BP 170/100 mmHg  Pulse 58  Ht 5\' 7"  (1.702 m)  Wt 145 lb (65.772 kg)  BMI 22.71 kg/m2  SpO2 97% Constitutional: He is oriented to person, place, and time. He appears well-developed and well-nourished. No distress.  HENT: No nasal discharge.  Head: Normocephalic and atraumatic.  Eyes: Pupils are equal and round.  No  discharge. Neck: Normal range of motion. Neck supple. No JVD present. No thyromegaly present.  Cardiovascular: Normal rate, regular rhythm, normal heart sounds. Exam reveals no gallop and no friction rub. No murmur heard.  Pulmonary/Chest: Effort normal and breath sounds normal. No stridor. No respiratory distress. He has no wheezes. He has no rales. He exhibits no tenderness.  Abdominal: Soft. Bowel sounds are normal. He exhibits no distension. There is no tenderness. There is no rebound and no guarding.  Musculoskeletal: Normal range of motion. He exhibits no edema and no tenderness.  Neurological: He is alert and oriented to person, place, and time. Coordination normal.  Skin: Skin is warm and dry. No rash noted. He is not diaphoretic. No erythema. No pallor.  Psychiatric: He has a normal mood and affect. His behavior is normal. Judgment and thought content normal.  Vascular: Femoral pulses are +1 bilaterally. Distal pulses are palpable.     EKG:   ASSESSMENT AND PLAN

## 2015-11-12 NOTE — Assessment & Plan Note (Signed)
The patient has evidence of bilateral common iliac artery stenosis. However, he is asymptomatic and has no claudications. Thus, I recommend continuing aggressive medical therapy. I discussed with him the importance of smoking cessation to prevent progression of peripheral arterial disease.

## 2015-11-12 NOTE — Patient Instructions (Signed)
Medication Instructions:  Your physician has recommended you make the following change in your medication:  START taking amlodipine 5mg  daily   Labwork: none  Testing/Procedures: Your physician has requested that you have a renal artery duplex. During this test, an ultrasound is used to evaluate blood flow to the kidneys. Allow one hour for this exam. Do not eat after midnight the day before and avoid carbonated beverages. Take your medications as you usually do.    Follow-Up: Your physician wants you to follow-up in: six months with Dr. Fletcher Anon.  You will receive a reminder letter in the mail two months in advance. If you don't receive a letter, please call our office to schedule the follow-up appointment.   Any Other Special Instructions Will Be Listed Below (If Applicable).     If you need a refill on your cardiac medications before your next appointment, please call your pharmacy.

## 2015-11-12 NOTE — Assessment & Plan Note (Signed)
His blood pressure is very elevated today after discontinuation of hydrochlorothiazide. I elected to add amlodipine 5 mg once daily. We can also consider switching metoprolol to carvedilol in the future especially with baseline bradycardia.  If renal artery duplex does not show renal artery stenosis, we can consider adding an ARB.

## 2015-11-16 IMAGING — US US ABDOMEN LIMITED
1 series · 14 of 25 positions shown · non-contrast
Comparison: CT scan of May 11, 2015.

CLINICAL DATA: Elevated liver enzymes.

EXAM:
US ABDOMEN LIMITED - RIGHT UPPER QUADRANT

[Series 1: us abdomen limited · 0.18mm/px · 14 of 57 slices shown]
[im 1/57]
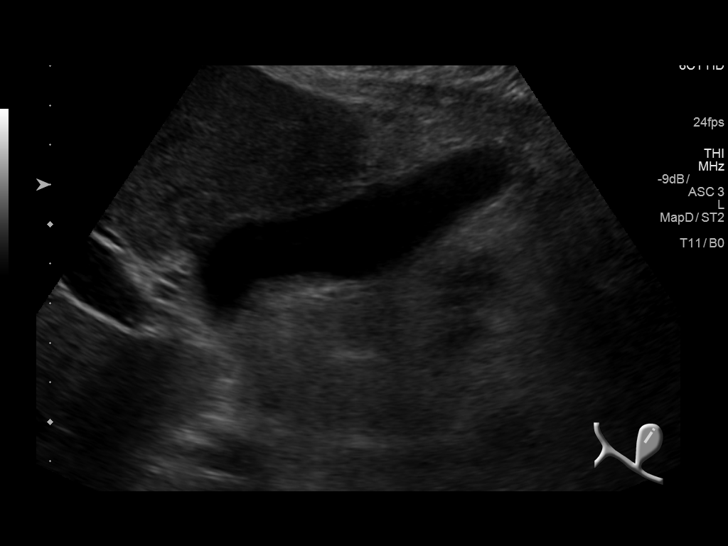
[im 5/57]
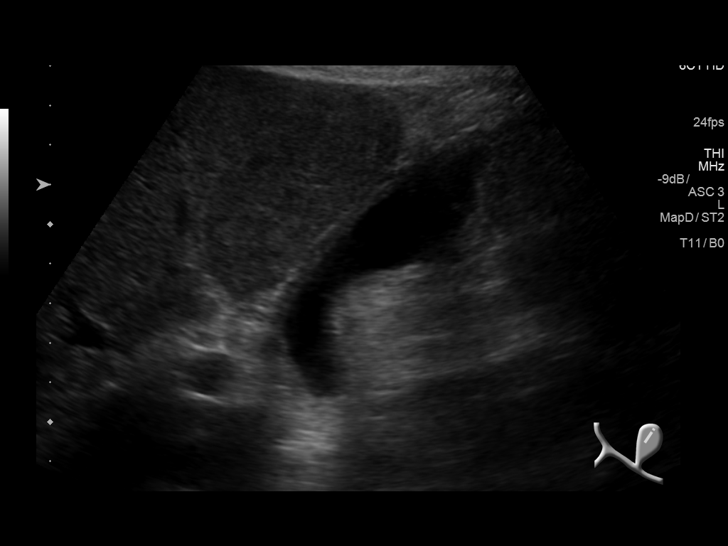
[im 10/57]
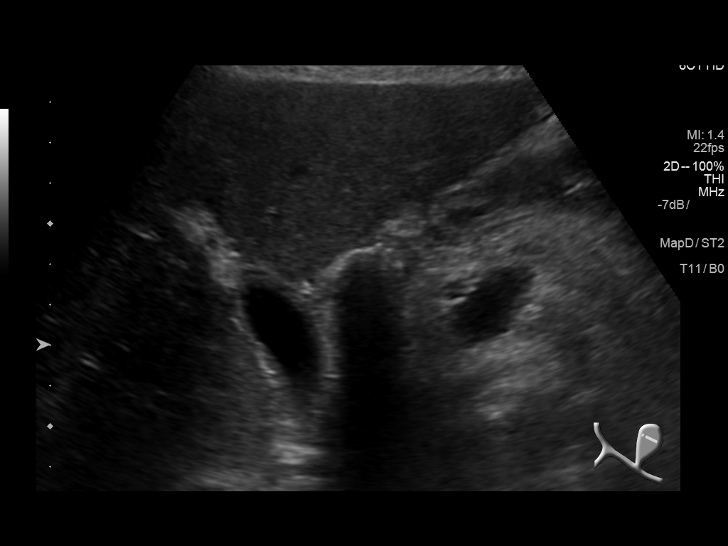
[im 15/57]
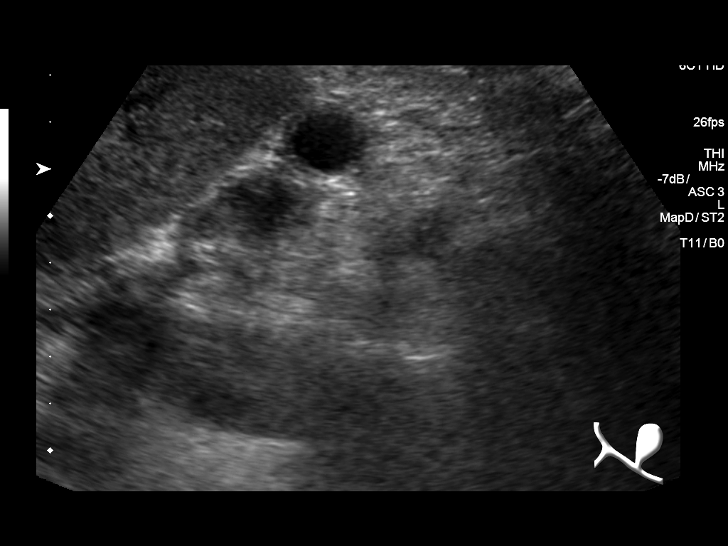
[im 19/57]
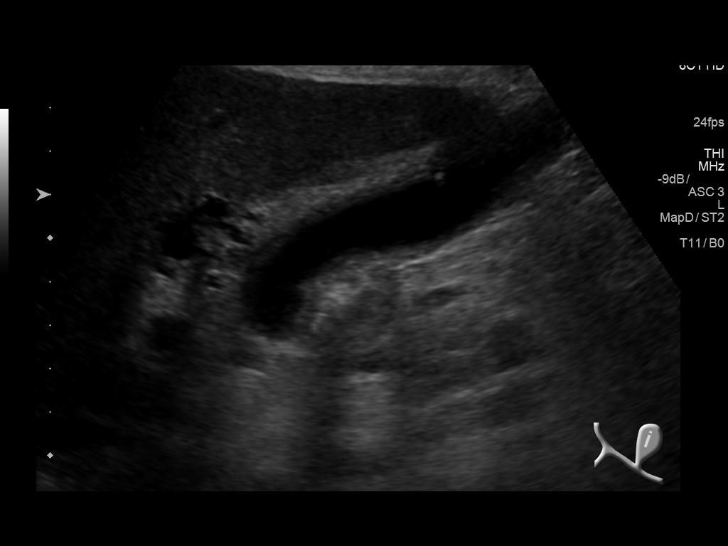
[im 22/57]
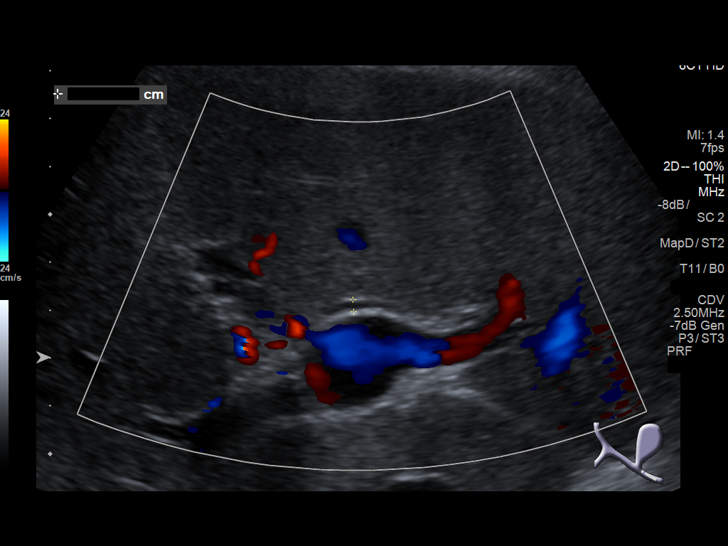
[im 26/57]
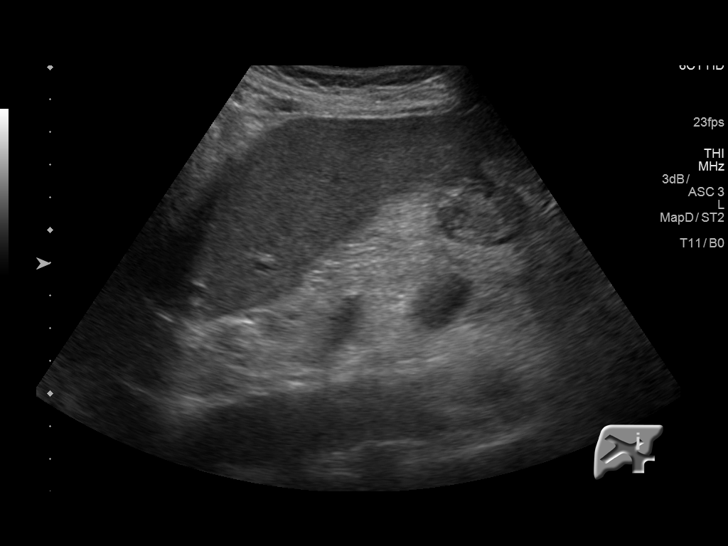
[im 31/57]
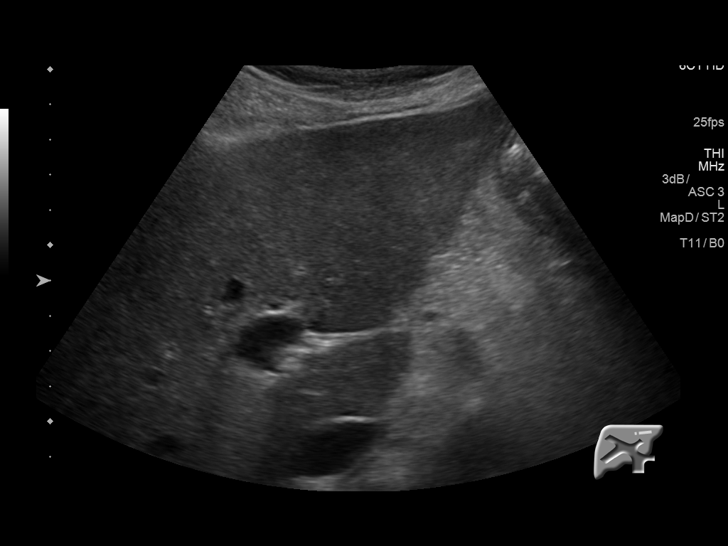
[im 36/57]
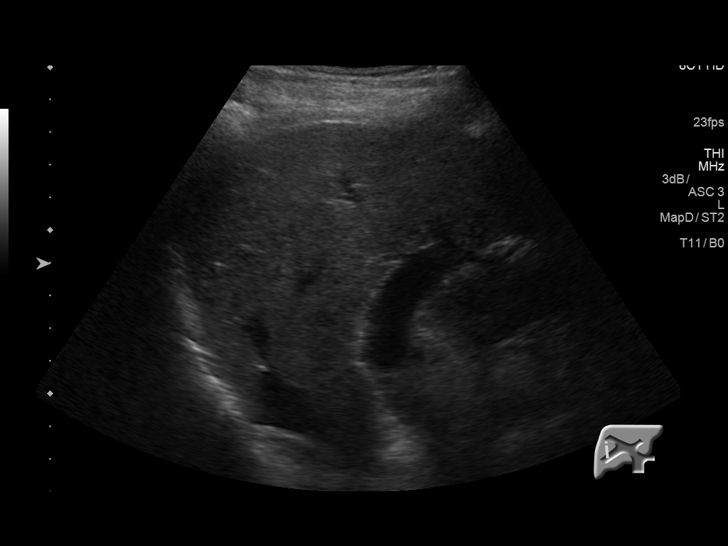
[im 38/57]
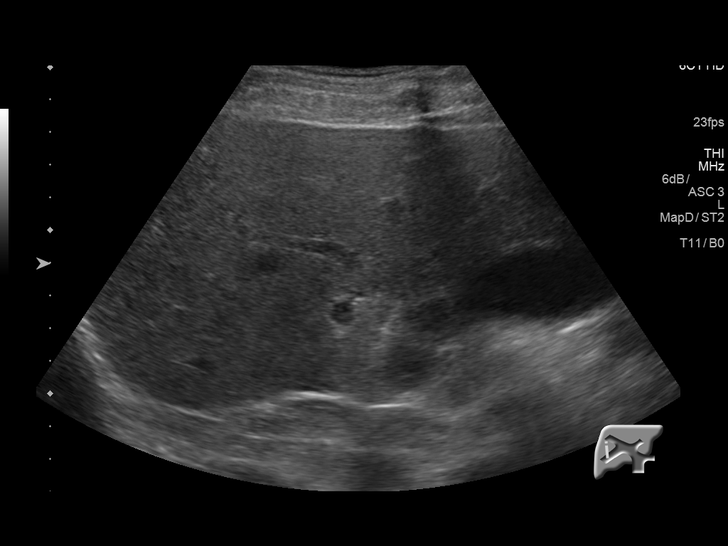
[im 43/57]
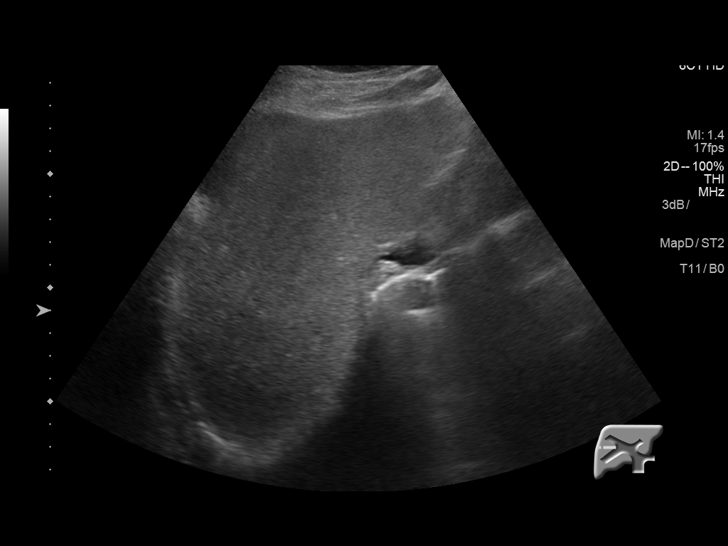
[im 47/57]
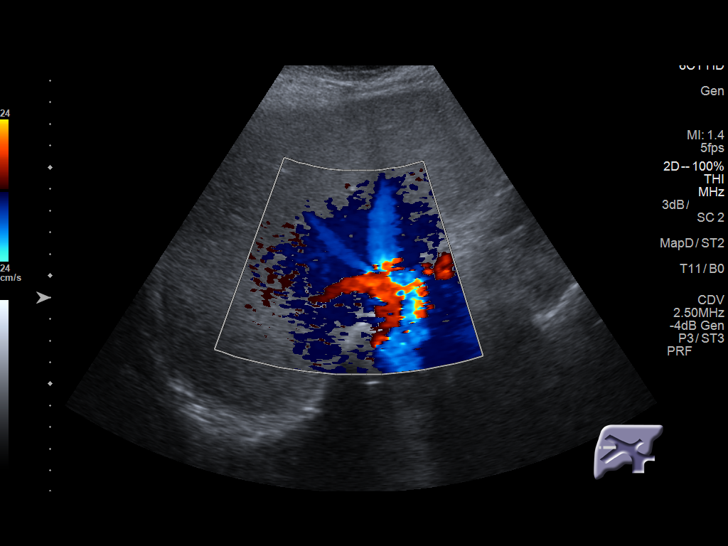
[im 52/57]
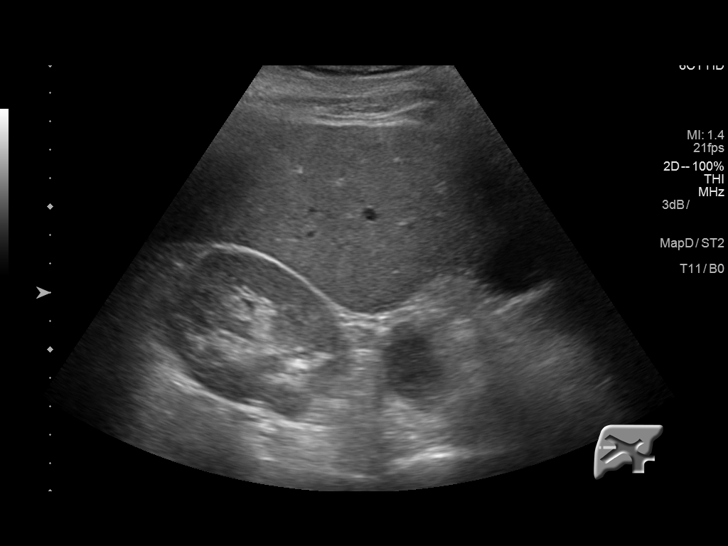
[im 57/57]
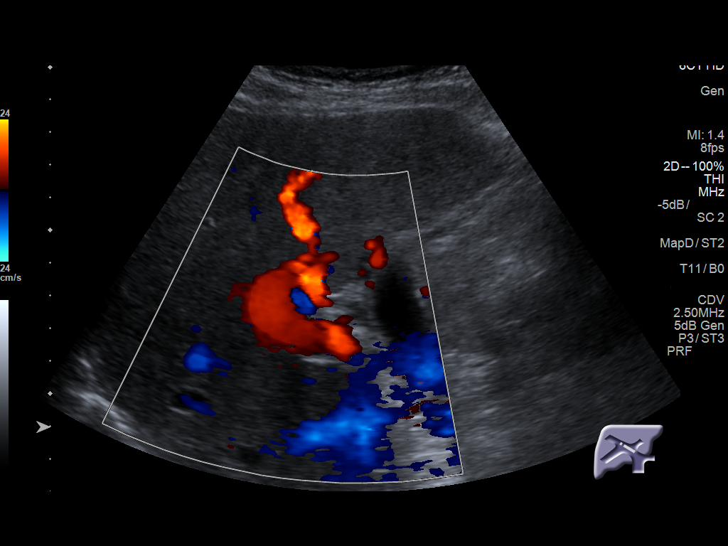

[14 of 25 positions shown; findings below may reference images not displayed]

FINDINGS: Gallbladder:

No gallstones or pericholecystic fluid is noted. Mild gallbladder
wall thickening is noted at 3.1 mm. No sonographic Murphy sign
noted. Several echogenic foci are noted in gallbladder wall
suggesting adenomyomatosis.

Common bile duct:

Diameter: 2.5 mm which is within normal limits.

Liver:

No focal lesion identified. Within normal limits in parenchymal
echogenicity. Mildly dilated portal vein is noted with normal
hepatopetal flow. Small amount of fluid is noted around the liver.
IMPRESSION: No cholelithiasis is noted.

Findings suspicious for adenomyomatosis of the gallbladder wall. No
definite cholecystitis is noted.

Small amount of free fluid is noted around the liver. No other
abnormality seen.

## 2015-11-24 ENCOUNTER — Ambulatory Visit: Payer: BLUE CROSS/BLUE SHIELD

## 2015-11-24 DIAGNOSIS — I701 Atherosclerosis of renal artery: Secondary | ICD-10-CM | POA: Diagnosis not present

## 2015-11-30 IMAGING — CT CT RENAL STONE PROTOCOL
2 of 4 series · 15 of 46 positions shown, 17 images · non-contrast
Comparison: CT of abdomen and pelvis 11/17/2014.

CLINICAL DATA: 64-year-old male with history of left-sided flank
pain for the past 3 days. History of kidney stones status post
lithotripsy 8 times in the past.

EXAM:
CT ABDOMEN AND PELVIS WITHOUT CONTRAST
TECHNIQUE: Multidetector CT imaging of the abdomen and pelvis was performed
following the standard protocol without IV contrast.

[Series 2: stone · axial · 0.63mm/px · z∈[-1034,-674]mm · 12 of 83 slices shown, 14 images]
[im 7/83  soft-tissue]
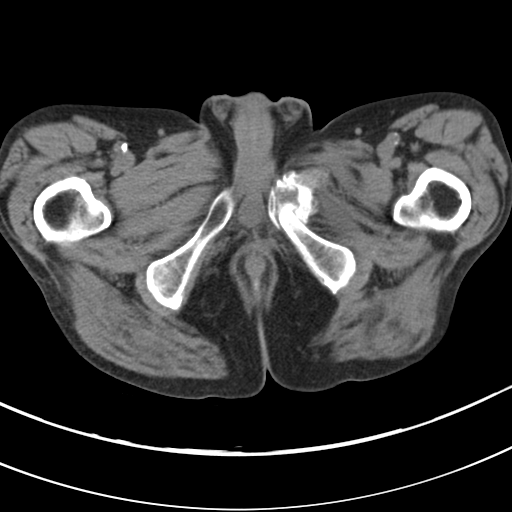
[im 7/83  bone]
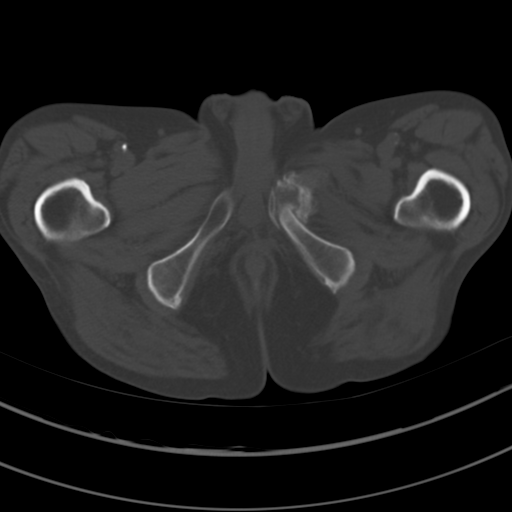
[im 14/83  soft-tissue]
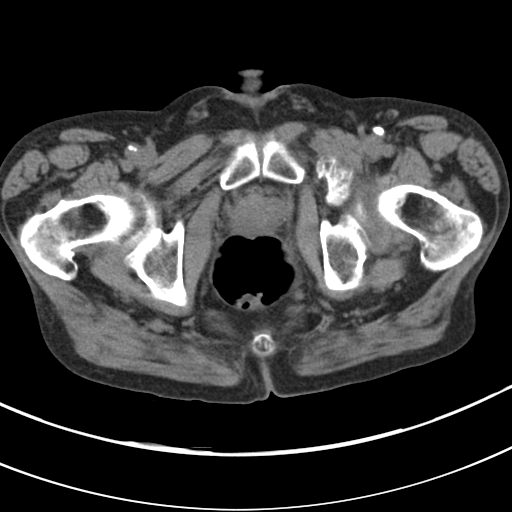
[im 20/83  soft-tissue]
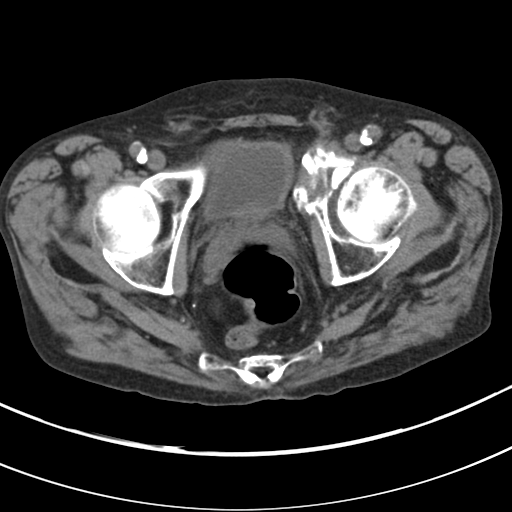
[im 27/83  soft-tissue]
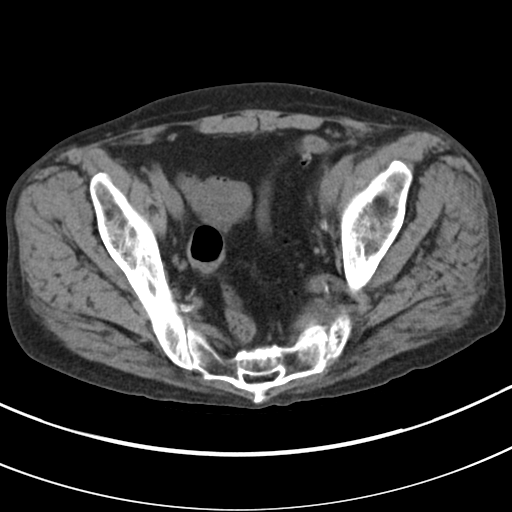
[im 33/83  soft-tissue]
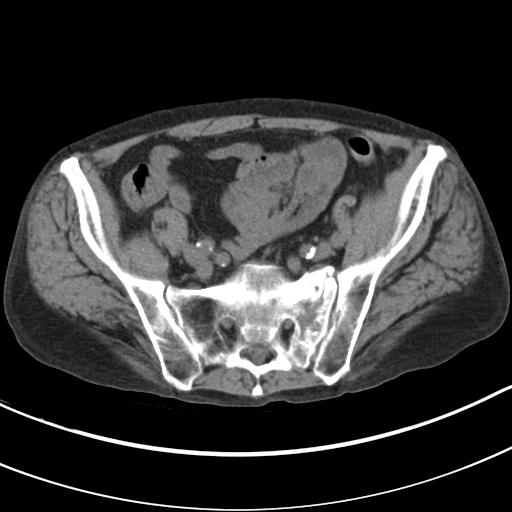
[im 40/83  soft-tissue]
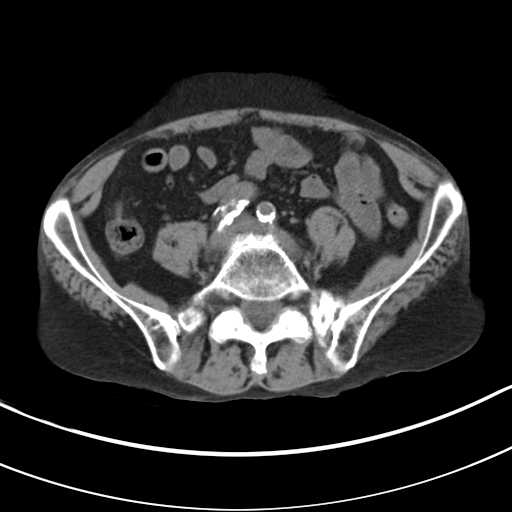
[im 46/83  soft-tissue]
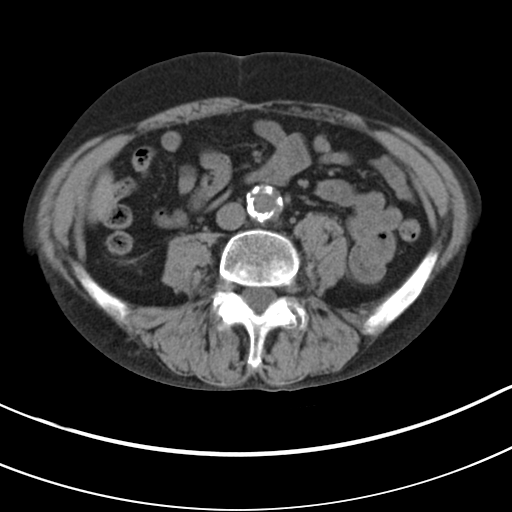
[im 53/83  soft-tissue]
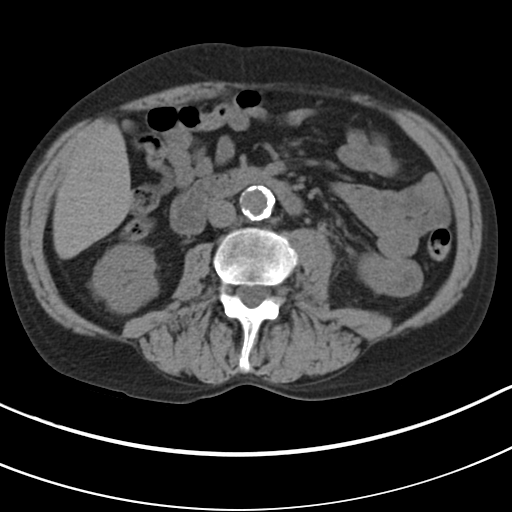
[im 60/83  soft-tissue]
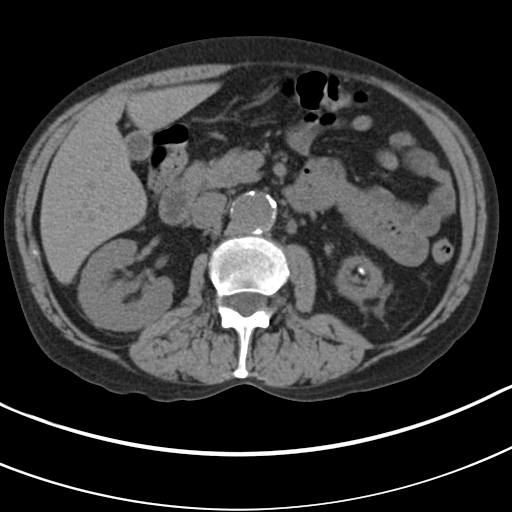
[im 60/83  bone]
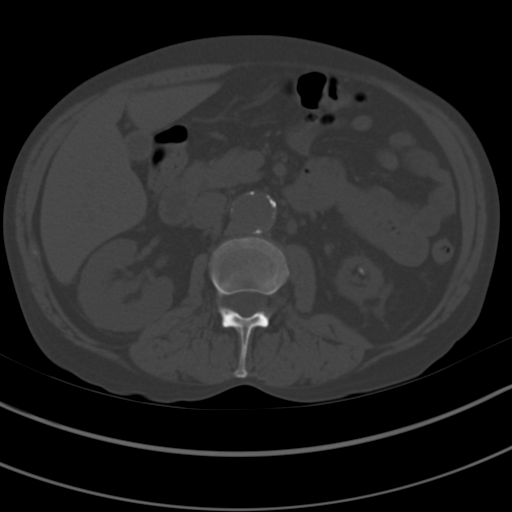
[im 66/83  soft-tissue]
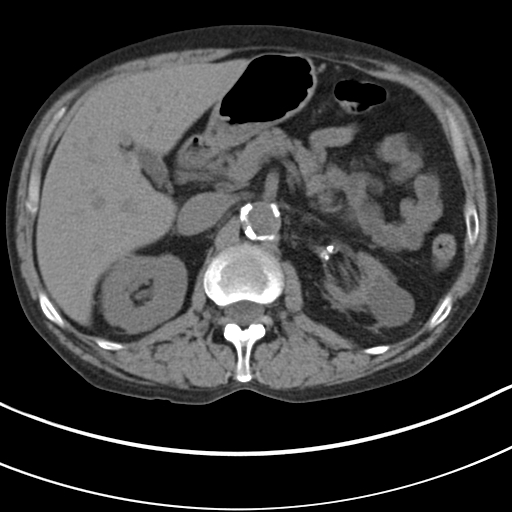
[im 73/83  soft-tissue]
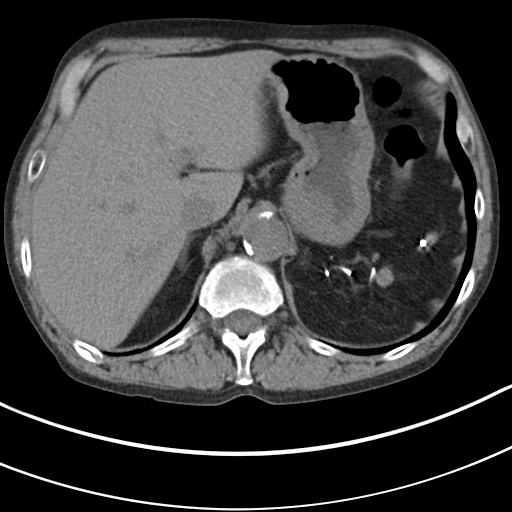
[im 79/83  soft-tissue]
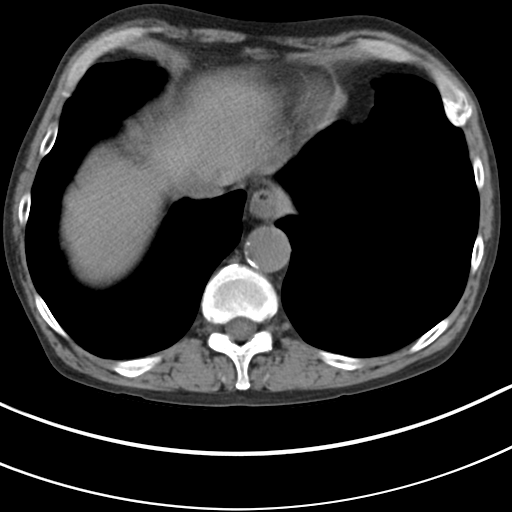

[Series 5: cor · coronal · 0.67mm/px · 3 of 127 slices shown]
[im 43/127  soft-tissue]
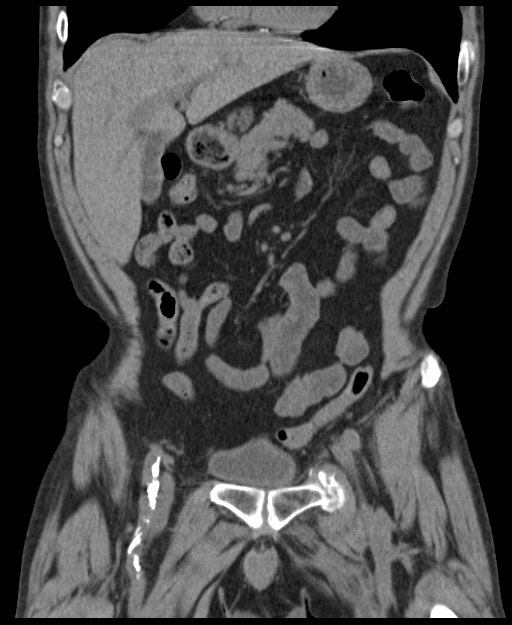
[im 57/127  soft-tissue]
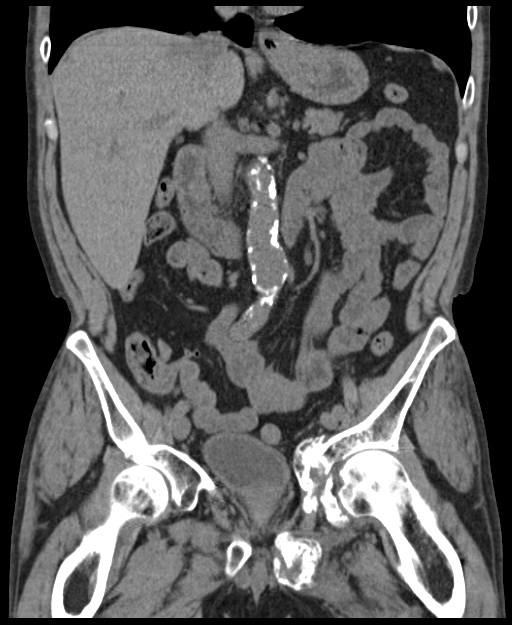
[im 71/127  soft-tissue]
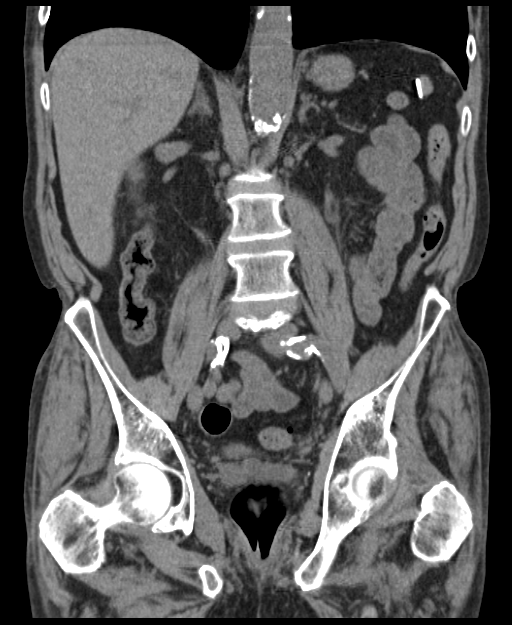

[15 of 46 positions shown; findings below may reference images not displayed]

FINDINGS: Lower chest: Atherosclerotic calcifications in the distal right
coronary artery.

Hepatobiliary: No definite cystic or solid hepatic lesions are
identified on today's noncontrast CT examination. The unenhanced
appearance of the gallbladder is unremarkable.

Pancreas: No definite pancreatic mass or peripancreatic inflammatory
changes identified on today's noncontrast CT.

Spleen: Status post splenectomy. Small splenules in the left upper
quadrant.

Adrenals/Urinary Tract: Several tiny nonobstructive calculi are
present within the collecting systems of the kidneys bilaterally
(left greater than right), largest of which measures 5 mm in the
lower pole of the left kidney. No additional calculi are identified
along the course of either ureter, or within the lumen of the
urinary bladder. No hydroureteronephrosis to indicate urinary tract
obstruction at this time. Severe left renal atrophy similar to the
prior examination. Exophytic 3.2 x 2.7 cm low-attenuation lesion in
the interpolar region of the left kidney is incompletely
characterized on today's noncontrast CT examination, but similar to
the prior study, presumably a cyst. Bilateral adrenal glands are
normal in appearance.

Stomach/Bowel: The unenhanced appearance of the stomach is normal.
No pathologic dilatation of small bowel or colon. A few scattered
colonic diverticulae are noted, without surrounding inflammatory
changes to suggest an acute diverticulitis at this time. Normal
appendix.

Vascular/Lymphatic: Extensive atherosclerosis throughout the
abdominal and pelvic vasculature, including fusiform ectasia of the
infrarenal abdominal aorta which measures up to 2.8 x 2.7 cm. Left
renal artery stents. No lymphadenopathy noted in the abdomen or
pelvis on today's noncontrast CT examination.

Reproductive: Prostate gland and seminal vesicles are unremarkable
in appearance.

Other: No significant volume of ascites.  No pneumoperitoneum.

Musculoskeletal: Healing fractures of the left superior and inferior
pubic rami are noted. Healed fracture of the left sacral ala.
Multiple healing left-sided rib fractures. There are no aggressive
appearing lytic or blastic lesions noted in the visualized portions
of the skeleton.
IMPRESSION: 1. Multiple nonobstructive calculi within the collecting systems of
the kidneys bilaterally, largest of which measures 5 mm in the the
lower pole collecting system of the left kidney. No ureteral stones
or findings of urinary tract obstruction are noted at this time.
2. Severe left renal atrophy.
3. Extensive atherosclerosis, including right coronary artery
disease. Please note that although the presence of coronary artery
calcium documents the presence of coronary artery disease, the
severity of this disease and any potential stenosis cannot be
assessed on this non-gated CT examination. Assessment for potential
risk factor modification, dietary therapy or pharmacologic therapy
may be warranted, if clinically indicated.
4. Mild colonic diverticulosis without evidence of acute
diverticulitis at this time.
5. Additional incidental findings, as above.

## 2015-12-07 ENCOUNTER — Ambulatory Visit: Payer: Self-pay | Admitting: Family Medicine

## 2015-12-11 ENCOUNTER — Other Ambulatory Visit: Payer: Self-pay

## 2015-12-11 MED ORDER — ROSUVASTATIN CALCIUM 20 MG PO TABS: ORAL_TABLET | ORAL | Status: DC

## 2015-12-18 ENCOUNTER — Ambulatory Visit: Payer: Self-pay | Admitting: Family Medicine

## 2015-12-24 ENCOUNTER — Ambulatory Visit (INDEPENDENT_AMBULATORY_CARE_PROVIDER_SITE_OTHER): Payer: BLUE CROSS/BLUE SHIELD | Admitting: Family Medicine

## 2015-12-24 ENCOUNTER — Telehealth: Payer: Self-pay | Admitting: Cardiovascular Disease

## 2015-12-24 ENCOUNTER — Encounter: Payer: Self-pay | Admitting: Family Medicine

## 2015-12-24 VITALS — BP 124/88 | HR 62 | Temp 98.1°F | Ht 67.0 in | Wt 150.5 lb

## 2015-12-24 DIAGNOSIS — F39 Unspecified mood [affective] disorder: Secondary | ICD-10-CM

## 2015-12-24 DIAGNOSIS — E785 Hyperlipidemia, unspecified: Secondary | ICD-10-CM | POA: Diagnosis not present

## 2015-12-24 DIAGNOSIS — F1010 Alcohol abuse, uncomplicated: Secondary | ICD-10-CM

## 2015-12-24 DIAGNOSIS — I150 Renovascular hypertension: Secondary | ICD-10-CM | POA: Diagnosis not present

## 2015-12-24 DIAGNOSIS — R4586 Emotional lability: Secondary | ICD-10-CM

## 2015-12-24 LAB — LDL CHOLESTEROL, DIRECT: LDL DIRECT: 108 mg/dL

## 2015-12-24 LAB — LIPID PANEL
Cholesterol: 196 mg/dL (ref 0–200)
HDL: 52.8 mg/dL (ref >39.00)
NONHDL: 142.74
Total CHOL/HDL Ratio: 4
Triglycerides: 220 mg/dL — ABNORMAL HIGH (ref 0.0–149.0)
VLDL: 44 mg/dL — AB (ref 0.0–40.0)

## 2015-12-24 NOTE — Assessment & Plan Note (Signed)
Chronic problem. Has been sober recently had a "slip up". Encouraged cessation.

## 2015-12-24 NOTE — Assessment & Plan Note (Signed)
Improved. Patient doing well at this time.  Continue Lexapro.

## 2015-12-24 NOTE — Progress Notes (Signed)
Subjective:  Patient ID: Edward Singleton, male    DOB: [birth year]  Age: 65 y.o. MRN: [id number]  CC: Follow up mood issues and chronic illnesses  HPI:  65 year old male with complicated past medical history presents for follow-up.  HTN  Patient was recently seen by cardiology and his blood pressure was elevated.  Amlodipine was added.  His blood pressure is currently well controlled on metoprolol and amlodipine.  Patient does have a history of renal artery stenosis.  Alcohol abuse  Patient has been sober recently had a "slip up".  He recently drank a pint of liquor on Sunday.  He's not sure why he did it.  He has had no alcohol use since then. He denies any alcohol in the home.  He states that he is not going to do it again.  Mood disturbance  At our last visit patient was agitated and having significant mood swings/mood issues.  After lengthy discussion, patient and sister agreed to try medication. He was placed on Lexapro.  He is currently drastically improved and doing well.  HLD  Most recent lipid panel was markedly abnormal.  He is currently on Crestor.  Need to obtain lipid panel today to reassess.  Social Hx   Social History   Social History  . Marital Status: Single    Spouse Name: N/A  . Number of Children: N/A  . Years of Education: N/A   Social History Main Topics  . Smoking status: Current Every Day Smoker -- 1.00 packs/day for 50 years    Types: Cigarettes  . Smokeless tobacco: Never Used  . Alcohol Use: No     Comment: No alcohol since Oct. 11, 2016  . Drug Use: No  . Sexual Activity: Not Asked   Other Topics Concern  . None   Social History Narrative   Review of Systems  Constitutional: Negative.   Respiratory: Negative.   Cardiovascular: Negative.   Psychiatric/Behavioral: Negative for agitation.   Objective:  BP 124/88 mmHg  Pulse 62  Temp(Src) 98.1 F (36.7 C) (Oral)  Ht 5\' 7"  (1.702 m)  Wt 150 lb 8 oz (68.266 kg)   BMI 23.57 kg/m2  SpO2 92%  BP/Weight 12/24/2015 11/12/2015 [id number]  Systolic BP [id number]  Diastolic BP 88 123XX123 88  Wt. (Lbs) 150.5 145 150  BMI 23.57 22.71 22.81   Physical Exam  Constitutional: He appears well-developed. No distress.  Eyes: No scleral icterus.  Cardiovascular: Normal rate and regular rhythm.   Pulmonary/Chest: Effort normal and breath sounds normal.  Abdominal: Soft. He exhibits no distension. There is no tenderness.  Neurological: He is alert.  Psychiatric:  Patient is pleasant and happy. Drastically improved from our prior visit.  Vitals reviewed.  Lab Results  Component Value Date   WBC 11.7* 11/06/2015   HGB 14.6 11/06/2015   HCT 43.6 11/06/2015   PLT 413 11/06/2015   GLUCOSE 100* 11/11/2015   CHOL 196 12/24/2015   TRIG 220.0* 12/24/2015   HDL 52.80 12/24/2015   LDLDIRECT 108.0 12/24/2015   LDLCALC 234* 10/16/2015   ALT 12 11/05/2015   AST 18 11/05/2015   NA 136 11/11/2015   K 5.0 11/11/2015   CL 99 11/11/2015   CREATININE 1.46 11/11/2015   BUN 21 11/11/2015   CO2 25 11/11/2015   TSH 1.880 11/17/2014   INR 0.93 03/24/2015   HGBA1C 5.6 01/15/2014   Assessment & Plan:   Problem List Items Addressed This Visit    Mood disturbance ([place])  Improved. Patient doing well at this time.  Continue Lexapro.      Hyperlipidemia - Primary    Unsure of control. Repeating lipid panel today. Continue Crestor.      Relevant Orders   Lipid Profile (Completed)   Direct LDL (Completed)   HTN (hypertension)    Well-controlled currently. Continue metoprolol and amlodipine      Alcohol abuse    Chronic problem. Has been sober recently had a "slip up". Encouraged cessation.        Follow-up: 3-6 months  [name] DO [facility]

## 2015-12-24 NOTE — Patient Instructions (Signed)
Continue your current medications.  We will call with the results of your labs.  Follow up (3-6 months; if you can).  Take care  Dr. Lacinda Axon

## 2015-12-24 NOTE — Assessment & Plan Note (Signed)
Well-controlled currently. Continue metoprolol and amlodipine

## 2015-12-24 NOTE — Progress Notes (Signed)
Pre visit review using our clinic review tool, if applicable. No additional management support is needed unless otherwise documented below in the visit note. 

## 2015-12-24 NOTE — Telephone Encounter (Signed)
Received records request Disability Determination Services , forwarded to [facility] for processing sent on 12/24/15.

## 2015-12-24 NOTE — Assessment & Plan Note (Signed)
Unsure of control. Repeating lipid panel today. Continue Crestor.

## 2015-12-28 ENCOUNTER — Encounter: Payer: Self-pay | Admitting: Physical Medicine & Rehabilitation

## 2015-12-28 ENCOUNTER — Other Ambulatory Visit: Payer: Self-pay | Admitting: Family Medicine

## 2015-12-28 ENCOUNTER — Encounter
Payer: BLUE CROSS/BLUE SHIELD | Attending: Physical Medicine & Rehabilitation | Admitting: Physical Medicine & Rehabilitation

## 2015-12-28 VITALS — BP 124/70 | HR 64

## 2015-12-28 DIAGNOSIS — G4700 Insomnia, unspecified: Secondary | ICD-10-CM | POA: Insufficient documentation

## 2015-12-28 DIAGNOSIS — Z87442 Personal history of urinary calculi: Secondary | ICD-10-CM | POA: Diagnosis not present

## 2015-12-28 DIAGNOSIS — I10 Essential (primary) hypertension: Secondary | ICD-10-CM | POA: Diagnosis not present

## 2015-12-28 DIAGNOSIS — F0390 Unspecified dementia without behavioral disturbance: Secondary | ICD-10-CM | POA: Insufficient documentation

## 2015-12-28 DIAGNOSIS — M109 Gout, unspecified: Secondary | ICD-10-CM | POA: Diagnosis not present

## 2015-12-28 DIAGNOSIS — F17210 Nicotine dependence, cigarettes, uncomplicated: Secondary | ICD-10-CM | POA: Diagnosis not present

## 2015-12-28 DIAGNOSIS — F1097 Alcohol use, unspecified with alcohol-induced persisting dementia: Secondary | ICD-10-CM

## 2015-12-28 DIAGNOSIS — S42402S Unspecified fracture of lower end of left humerus, sequela: Secondary | ICD-10-CM | POA: Insufficient documentation

## 2015-12-28 DIAGNOSIS — N400 Enlarged prostate without lower urinary tract symptoms: Secondary | ICD-10-CM | POA: Diagnosis not present

## 2015-12-28 DIAGNOSIS — F1010 Alcohol abuse, uncomplicated: Secondary | ICD-10-CM | POA: Insufficient documentation

## 2015-12-28 DIAGNOSIS — M12512 Traumatic arthropathy, left shoulder: Secondary | ICD-10-CM | POA: Diagnosis not present

## 2015-12-28 DIAGNOSIS — E7800 Pure hypercholesterolemia, unspecified: Secondary | ICD-10-CM | POA: Diagnosis not present

## 2015-12-28 DIAGNOSIS — N261 Atrophy of kidney (terminal): Secondary | ICD-10-CM | POA: Insufficient documentation

## 2015-12-28 DIAGNOSIS — F1027 Alcohol dependence with alcohol-induced persisting dementia: Secondary | ICD-10-CM | POA: Insufficient documentation

## 2015-12-28 DIAGNOSIS — M75102 Unspecified rotator cuff tear or rupture of left shoulder, not specified as traumatic: Secondary | ICD-10-CM

## 2015-12-28 DIAGNOSIS — F0391 Unspecified dementia with behavioral disturbance: Secondary | ICD-10-CM | POA: Diagnosis not present

## 2015-12-28 DIAGNOSIS — M12812 Other specific arthropathies, not elsewhere classified, left shoulder: Secondary | ICD-10-CM | POA: Insufficient documentation

## 2015-12-28 MED ORDER — TRAZODONE HCL 50 MG PO TABS: 50.0000 mg | ORAL_TABLET | Freq: Every day | ORAL | Status: DC

## 2015-12-28 MED ORDER — ALLOPURINOL 300 MG PO TABS: 150.0000 mg | ORAL_TABLET | Freq: Every day | ORAL | Status: DC

## 2015-12-28 MED ORDER — METOPROLOL SUCCINATE ER 25 MG PO TB24: 25.0000 mg | ORAL_TABLET | Freq: Every day | ORAL | Status: DC

## 2015-12-28 MED ORDER — ESCITALOPRAM OXALATE 20 MG PO TABS: 10.0000 mg | ORAL_TABLET | Freq: Every day | ORAL | Status: DC

## 2015-12-28 NOTE — Telephone Encounter (Signed)
Refilled in January. Please advise refill due to patient needing year supply.

## 2015-12-28 NOTE — Patient Instructions (Signed)
  PLEASE CALL ME WITH ANY PROBLEMS OR QUESTIONS (#[phone]).      

## 2015-12-28 NOTE — Progress Notes (Signed)
Subjective:    Patient ID: Edward Singleton, male    DOB: 28-Feb-1951, 65 y.o.   MRN: [id number]  HPI   Edward Singleton is here in follow up of his polytrauma. He has been sober except for a "slip-up" recently when he drank a pint of liquor. His sister is with him and states that he's actually been doing very well as of late in most areas. He has meals on wheels coming to the house.   He was placed on lexapro for depression and mood swings which has helped immensely.   Left shoulder and neck are still tender. He performed his rotator cuff exercises at home to no avail..   Pain Inventory Average Pain NA Pain Right Now NA My pain is NA  In the last 24 hours, has pain interfered with the following? General activity NA Relation with others NA Enjoyment of life NA What TIME of day is your pain at its worst? NA Sleep (in general) NA  Pain is worse with: NA Pain improves with: NA Relief from Meds: NA  Mobility walk without assistance how many minutes can you walk? unlimited  ability to climb steps?  yes do you drive?  no  Function not employed: date last employed 11/18/15 I need assistance with the following:  meal prep and shopping  Neuro/Psych No problems in this area  Prior Studies Any changes since last visit?  no  Physicians involved in your care Primary care . [facility], cardiologist, vascular    Family History  Problem Relation Age of Onset  . Nephrolithiasis Paternal Grandfather   . Kidney disease Neg Hx   . Prostate cancer Neg Hx   . Heart disease Mother   . Heart disease Father   . Diabetes Father    Social History   Social History  . Marital Status: Single    Spouse Name: N/A  . Number of Children: N/A  . Years of Education: N/A   Social History Main Topics  . Smoking status: Current Every Day Smoker -- 1.00 packs/day for 50 years    Types: Cigarettes  . Smokeless tobacco: Never Used  . Alcohol Use: No     Comment: No alcohol since Oct. 11, 2016  .  Drug Use: No  . Sexual Activity: Not Asked   Other Topics Concern  . None   Social History Narrative   Past Surgical History  Procedure Laterality Date  . Orif humerus fracture Left 11/18/2014    Procedure: OPEN REDUCTION INTERNAL FIXATION (ORIF) DISTAL HUMERUS FRACTURE;  Surgeon: Rozanna Box, MD;  Location: [facility];  Service: Orthopedics;  Laterality: Left;  . Lithotripsy    . Left renal stent placement  2013  . Splenectomy     Past Medical History  Diagnosis Date  . Hypertension   . Hypercholesteremia   . Gout   . Pelvic fracture ([place]) 11/17/2014  . Benign enlargement of prostate   . Kidney stones   . Atrophic kidney   . Left flank pain, chronic   . 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 Alcohol abuse    BP 124/70 mmHg  Pulse 64  SpO2 97%  Opioid Risk Score:   Fall Risk Score:  `1  Depression screen PHQ 2/9  Depression screen PHQ 2/9 07/07/2015  Decreased Interest 0  Down, Depressed, Hopeless 0  PHQ - 2 Score 0  Altered sleeping 0  Tired, decreased energy 3  Change in appetite 0  Feeling bad or  failure about yourself  0  Trouble concentrating 0  Moving slowly or fidgety/restless 0  Suicidal thoughts 0  PHQ-9 Score 3  Difficult doing work/chores Somewhat difficult     Review of Systems  All other systems reviewed and are negative.      Objective:   Physical Exam  Head: Normocephalic. Has gained weight. Looks good.  Eyes: EOM are normal.  Neck: Normal range of motion.  Cardiovascular: Normal rate and regular rhythm.  Respiratory: Effort normal and breath sounds normal. No respiratory distress.  GI: Soft. Bowel sounds are normal. He exhibits no distension.  Musculoskeletal:  left rotator cuff tendereness with provocation and tightness with PROM. Cervical muscles taut on left, left trap and SCM tight as well Neurological: he is more alert and appropriate today.  Patient is oriented to me, reason he's here. Improved insight and awareness. Still with STM  deficits. Balance improved  Psychiatric:  Calm and collected.      Assessment/Plan:  1. Functional deficits secondary to multitrauma after a fall with left distal humerus fracture, pelvic fx's status post ORIF humerus fx. --  2. ETOH abuse--this an ongoing issue as well as the associated dementia/SVD  -has been much improved thanks to family.  3. Left shoulder RTC syndrome, cervicalgia  -After informed consent and preparation of the skin with betadine and isopropyl alcohol, I injected 6mg  (1cc) of celestone and 4cc of 1% lidocaine into the left subacromial space via anterior approach. Additionally, aspiration was performed prior to injection. The patient tolerated well, and no complications were encountered. Afterward the area was cleaned and dressed. Post- injection instructions were provided.  -continue with exercises  4. SVD/dementia  -doing well with intermittent supervision at home at this point. 5. Insomnia: trazodone for sleep.  6. HTN/CAD: refilled metoprolol today.   -discussed smoking cessation as it pertains to heart and his iliac artery disease    Follow up with me 6 months. Thirty minutes of face to face patient care time were spent during this visit. All questions were encouraged and answered.

## 2015-12-31 ENCOUNTER — Encounter: Payer: Self-pay | Admitting: Cardiovascular Disease

## 2015-12-31 ENCOUNTER — Ambulatory Visit (INDEPENDENT_AMBULATORY_CARE_PROVIDER_SITE_OTHER): Payer: BLUE CROSS/BLUE SHIELD | Admitting: Cardiovascular Disease

## 2015-12-31 VITALS — BP 134/80 | HR 54 | Ht 67.5 in | Wt 152.0 lb

## 2015-12-31 DIAGNOSIS — I739 Peripheral vascular disease, unspecified: Secondary | ICD-10-CM | POA: Diagnosis not present

## 2015-12-31 DIAGNOSIS — I701 Atherosclerosis of renal artery: Secondary | ICD-10-CM | POA: Diagnosis not present

## 2015-12-31 NOTE — Progress Notes (Signed)
Cardiology Office Note   Date:  12/31/2015   ID:  Edward Singleton, DOB 11-27-1950, MRN [id number]  PCP:  Edward Spikes, DO  Cardiologist:  Dr. Rockey Singleton Nephrologist: Dr. Juleen Singleton  Chief Complaint  Patient presents with  . other    F/u renal artery duplex. Meds reviewed verbally with pt.      History of Present Illness: Edward Singleton is a 65 y.o. male who presents for A follow-up visit regarding peripheral arterial disease and renal artery stenosis. The patient has history of three-vessel coronary artery calcifications noted on CT scan, alcohol abuse with multiple hospitalizations in 2016 for intoxication, chronic kidney disease, history of traumatic left arm injury, tobacco use, C. difficile diarrhea, hypertension and possible dementia. He had left renal artery stent placement in 2013. CT scan showed aortic calcifications and thus the patient underwent aortoiliac duplex which showed evidence of severe bilateral common iliac artery stenosis with a peak velocity around 450.  He was treated medically for peripheral arterial disease given the lack of claudication. During last visit, he was noted to be hypertensive with mild worsening of kidney function. Thus, I added amlodipine 5 mg once daily. I ordered renal artery duplex which showed occluded left renal artery stent with atrophied left kidney. The right renal artery was normal. He has been doing well and denies chest pain, shortness of breath or claudication. His blood pressure improved significantly and kidney function also improved.  Past Medical History  Diagnosis Date  . Hypertension   . Hypercholesteremia   . Gout   . Pelvic fracture ([place]) 11/17/2014  . Benign enlargement of prostate   . Kidney stones   . Atrophic kidney   . Left flank pain, chronic   . 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 Alcohol abuse     Past Surgical History  Procedure Laterality Date  . Orif humerus fracture Left 11/18/2014    Procedure: OPEN REDUCTION  INTERNAL FIXATION (ORIF) DISTAL HUMERUS FRACTURE;  Surgeon: Edward Box, MD;  Location: [facility];  Service: Orthopedics;  Laterality: Left;  . Lithotripsy    . Left renal stent placement  2013  . Splenectomy       Current Outpatient Prescriptions  Medication Sig Dispense Refill  . allopurinol (ZYLOPRIM) 300 MG tablet Take 0.5 tablets (150 mg total) by mouth daily. 90 tablet 2  . amLODipine (NORVASC) 5 MG tablet Take 1 tablet (5 mg total) by mouth daily. 30 tablet 6  . aspirin 81 MG tablet Take 81 mg by mouth daily.    Marland Kitchen escitalopram (LEXAPRO) 20 MG tablet Take 0.5 tablets (10 mg total) by mouth daily. 90 tablet 2  . folic acid (FOLVITE) 1 MG tablet Take 1 tablet (1 mg total) by mouth daily. 90 tablet 1  . metoprolol succinate (TOPROL-XL) 25 MG 24 hr tablet Take 1 tablet (25 mg total) by mouth daily. 90 tablet 3  . rosuvastatin (CRESTOR) 20 MG tablet Take one tab daily 90 tablet 3  . traZODone (DESYREL) 50 MG tablet Take 1 tablet (50 mg total) by mouth at bedtime. 90 tablet 2   No current facility-administered medications for this visit.    Allergies:   Review of patient's allergies indicates no known allergies.    Social History:  The patient  reports that he has been smoking Cigarettes.  He has a 50 pack-year smoking history. He has never used smokeless tobacco. He reports that he does not drink alcohol or use illicit  drugs.   Family History:  The patient's family history includes Diabetes in his father; Heart disease in his father and mother; Nephrolithiasis in his paternal grandfather. There is no history of Kidney disease or Prostate cancer.    ROS:  Please see the history of present illness.   Otherwise, review of systems are positive for none.   All other systems are reviewed and negative.    PHYSICAL EXAM: VS:  BP 134/80 mmHg  Pulse 54  Ht 5' 7.5" (1.715 m)  Wt 152 lb (68.947 kg)  BMI 23.44 kg/m2 , BMI Body mass index is 23.44 kg/(m^2). GEN: Well nourished, well  developed, in no acute distress HEENT: normal Neck: no JVD, carotid bruits, or masses Cardiac: RRR; no murmurs, rubs, or gallops,no edema  Respiratory:  clear to auscultation bilaterally, normal work of breathing GI: soft, nontender, nondistended, + BS MS: no deformity or atrophy Skin: warm and dry, no rash Neuro:  Strength and sensation are intact Psych: euthymic mood, full affect   EKG:  EKG is not ordered today.    Recent Labs: 07/26/2015: Magnesium 1.8 11/05/2015: ALT 12 11/06/2015: Hemoglobin 14.6; Platelets 413 11/11/2015: BUN 21; Creatinine, Ser 1.46; Potassium 5.0; Sodium 136    Lipid Panel    Component Value Date/Time   CHOL 196 12/24/2015 1009   CHOL 331* 10/16/2015 1216   TRIG 220.0* 12/24/2015 1009   HDL 52.80 12/24/2015 1009   HDL 44 10/16/2015 1216   CHOLHDL 4 12/24/2015 1009   CHOLHDL 7.5* 10/16/2015 1216   VLDL 44.0* 12/24/2015 1009   LDLCALC 234* 10/16/2015 1216   LDLCALC 23 11/18/2014 0342   LDLDIRECT 108.0 12/24/2015 1009      Wt Readings from Last 3 Encounters:  12/31/15 152 lb (68.947 kg)  12/24/15 150 lb 8 oz (68.266 kg)  11/12/15 145 lb (65.772 kg)       ASSESSMENT AND PLAN:  1.  Peripheral arterial disease: The patient has known bilateral common iliac artery stenosis but currently has no claudication. Thus, I have recommended continued medical therapy.  2. Renovascular hypertension: Status post left renal artery stent in 2013 with recent duplex showing occluded left renal artery stents and atrophied left kidney. Right renal artery was normal. Continue medical therapy for hypertension which has improved with the addition of amlodipine.  3. Hyperlipidemia: The patient has known history of severe hyperlipidemia with previous LDL of 235. This has improved significantly to 108 with current dose of rosuvastatin. The dose was recently increased by Dr. Lacinda Singleton to 40 mg once daily but the patient has not started the dose yet.  4. Tobacco use: I had a  discussion with the patient about the importance of smoking cessation and he is trying to cut smoking gradually.      Disposition:   FU with me in 1 year  Signed,  Kathlyn Sacramento, MD  12/31/2015 9:28 AM    [place]

## 2015-12-31 NOTE — Patient Instructions (Signed)
Medication Instructions: Continue same medications.   Labwork: None.   Procedures/Testing: None.   Follow-Up: 1 year follow up with Dr. Fletcher Anon.   Any Additional Special Instructions Will Be Listed Below (If Applicable).     If you need a refill on your cardiac medications before your next appointment, please call your pharmacy.

## 2016-01-14 ENCOUNTER — Telehealth: Payer: Self-pay | Admitting: Family Medicine

## 2016-03-24 ENCOUNTER — Emergency Department
Admission: EM | Admit: 2016-03-24 | Discharge: 2016-03-24 | Disposition: A | Discharge status: Home / Self Care | Payer: BLUE CROSS/BLUE SHIELD | Attending: Emergency Medicine | Admitting: Emergency Medicine

## 2016-03-24 DIAGNOSIS — E1165 Type 2 diabetes mellitus with hyperglycemia: Secondary | ICD-10-CM | POA: Insufficient documentation

## 2016-03-24 DIAGNOSIS — E119 Type 2 diabetes mellitus without complications: Secondary | ICD-10-CM

## 2016-03-24 DIAGNOSIS — Z7982 Long term (current) use of aspirin: Secondary | ICD-10-CM | POA: Insufficient documentation

## 2016-03-24 DIAGNOSIS — F17210 Nicotine dependence, cigarettes, uncomplicated: Secondary | ICD-10-CM | POA: Insufficient documentation

## 2016-03-24 DIAGNOSIS — Z79899 Other long term (current) drug therapy: Secondary | ICD-10-CM | POA: Insufficient documentation

## 2016-03-24 DIAGNOSIS — I129 Hypertensive chronic kidney disease with stage 1 through stage 4 chronic kidney disease, or unspecified chronic kidney disease: Secondary | ICD-10-CM | POA: Insufficient documentation

## 2016-03-24 DIAGNOSIS — N183 Chronic kidney disease, stage 3 (moderate): Secondary | ICD-10-CM | POA: Insufficient documentation

## 2016-03-24 DIAGNOSIS — E785 Hyperlipidemia, unspecified: Secondary | ICD-10-CM | POA: Insufficient documentation

## 2016-03-24 DIAGNOSIS — I2510 Atherosclerotic heart disease of native coronary artery without angina pectoris: Secondary | ICD-10-CM | POA: Insufficient documentation

## 2016-03-24 DIAGNOSIS — E860 Dehydration: Secondary | ICD-10-CM | POA: Insufficient documentation

## 2016-03-24 LAB — URINALYSIS COMPLETE WITH MICROSCOPIC (ARMC ONLY)
Bacteria, UA: NONE SEEN
Bilirubin Urine: NEGATIVE
Hgb urine dipstick: NEGATIVE
Ketones, ur: NEGATIVE mg/dL
Leukocytes, UA: NEGATIVE
Nitrite: NEGATIVE
PROTEIN: 30 mg/dL — AB
SPECIFIC GRAVITY, URINE: 1.022 (ref 1.005–1.030)
SQUAMOUS EPITHELIAL / LPF: NONE SEEN
pH: 5 (ref 5.0–8.0)

## 2016-03-24 LAB — BASIC METABOLIC PANEL
Anion gap: 11 (ref 5–15)
BUN: 27 mg/dL — ABNORMAL HIGH (ref 6–20)
CALCIUM: 9.8 mg/dL (ref 8.9–10.3)
CHLORIDE: 93 mmol/L — AB (ref 101–111)
CO2: 26 mmol/L (ref 22–32)
CREATININE: 1.94 mg/dL — AB (ref 0.61–1.24)
GFR calc Af Amer: 41 mL/min — ABNORMAL LOW (ref >60)
GFR calc non Af Amer: 35 mL/min — ABNORMAL LOW (ref >60)
GLUCOSE: 380 mg/dL — AB (ref 65–99)
Potassium: 4.6 mmol/L (ref 3.5–5.1)
Sodium: 130 mmol/L — ABNORMAL LOW (ref 135–145)

## 2016-03-24 LAB — CBC
HCT: 39.9 % — ABNORMAL LOW (ref 40.0–52.0)
HEMOGLOBIN: 13.6 g/dL (ref 13.0–18.0)
MCH: 33.1 pg (ref 26.0–34.0)
MCHC: 34.1 g/dL (ref 32.0–36.0)
MCV: 97.1 fL (ref 80.0–100.0)
Platelets: 380 10*3/uL (ref 150–440)
RBC: 4.11 MIL/uL — ABNORMAL LOW (ref 4.40–5.90)
RDW: 14.8 % — ABNORMAL HIGH (ref 11.5–14.5)
WBC: 13.9 10*3/uL — ABNORMAL HIGH (ref 3.8–10.6)

## 2016-03-24 LAB — HEPATIC FUNCTION PANEL
ALT: 13 U/L — AB (ref 17–63)
AST: 18 U/L (ref 15–41)
Albumin: 4.4 g/dL (ref 3.5–5.0)
Alkaline Phosphatase: 145 U/L — ABNORMAL HIGH (ref 38–126)
BILIRUBIN TOTAL: 0.6 mg/dL (ref 0.3–1.2)
Total Protein: 8 g/dL (ref 6.5–8.1)

## 2016-03-24 LAB — GLUCOSE, CAPILLARY
GLUCOSE-CAPILLARY: 384 mg/dL — AB (ref 65–99)
Glucose-Capillary: 235 mg/dL — ABNORMAL HIGH (ref 65–99)

## 2016-03-24 MED ORDER — METFORMIN HCL 500 MG PO TABS: 500.0000 mg | ORAL_TABLET | Freq: Two times a day (BID) | ORAL | Status: DC

## 2016-03-24 MED ORDER — SODIUM CHLORIDE 0.9 % IV SOLN
1000.0000 mL | Freq: Once | INTRAVENOUS | Status: AC
  Administered 2016-03-24: 1000 mL via INTRAVENOUS

## 2016-03-24 NOTE — ED Notes (Signed)
POA: Chauncy Passy [phone]

## 2016-03-24 NOTE — ED Provider Notes (Signed)
[facility] Emergency Department Provider Note  ____________________________________________    I have reviewed the triage vital signs and the nursing notes.   HISTORY  Chief Complaint Hyperglycemia    HPI Edward Singleton is a 65 y.o. male who presents with complaints of elevated blood sugar. Patient reports he saw his PCP today who drew labs and he was called and sent to our emergency department because he was told his glucose was high. Patient does not have diabetes. Reportedly he also has only one functioning kidney. He reports he feels well has no complaints. No fevers or chills. No nausea or vomiting. No fevers.     Past Medical History  Diagnosis Date  . Hypertension   . Hypercholesteremia   . Gout   . Pelvic fracture ([place]) 11/17/2014  . Benign enlargement of prostate   . Kidney stones   . Atrophic kidney   . Left flank pain, chronic   . 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 Alcohol abuse     Patient Active Problem List   Diagnosis Date Noted  . Left rotator cuff tear arthropathy 12/28/2015  . Hyperlipidemia 12/24/2015  . Tobacco abuse 11/06/2015  . Mood disturbance ([place]) 11/06/2015  . CAD (coronary artery disease) 10/16/2015  . PAD (peripheral artery disease) ([place]) 10/16/2015  . Preventative health care 08/13/2015  . Renal artery stenosis ([place]) 07/30/2015  . Kidney atrophy 07/30/2015  . CKD (chronic kidney disease), stage III 07/30/2015  . Insomnia 07/22/2015  . Anemia 07/15/2015  . History of substance abuse 07/15/2015  . BPH (benign prostatic hyperplasia) 07/15/2015  . Gout 07/15/2015  . Kidney stones 05/11/2015  . 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 HTN (hypertension) 11/17/2014  . Alcohol abuse 11/17/2014  . History of nonadherence to medical treatment 11/17/2014    Past Surgical History  Procedure Laterality Date  . Orif humerus fracture Left 11/18/2014    Procedure: OPEN REDUCTION INTERNAL FIXATION (ORIF) DISTAL HUMERUS  FRACTURE;  Surgeon: Rozanna Box, MD;  Location: [facility];  Service: Orthopedics;  Laterality: Left;  . Lithotripsy    . Left renal stent placement  2013  . Splenectomy      Current Outpatient Rx  Name  Route  Sig  Dispense  Refill  . allopurinol (ZYLOPRIM) 300 MG tablet   Oral   Take 0.5 tablets (150 mg total) by mouth daily.   90 tablet   2   . amLODipine (NORVASC) 5 MG tablet   Oral   Take 1 tablet (5 mg total) by mouth daily.   30 tablet   6   . aspirin 81 MG tablet   Oral   Take 81 mg by mouth daily.         Marland Kitchen escitalopram (LEXAPRO) 20 MG tablet   Oral   Take 0.5 tablets (10 mg total) by mouth daily.   90 tablet   2   . folic acid (FOLVITE) 1 MG tablet   Oral   Take 1 tablet (1 mg total) by mouth daily.   90 tablet   1   . metoprolol succinate (TOPROL-XL) 25 MG 24 hr tablet   Oral   Take 1 tablet (25 mg total) by mouth daily.   90 tablet   3   . rosuvastatin (CRESTOR) 20 MG tablet      Take one tab daily   90 tablet   3   . traZODone (DESYREL) 50 MG tablet   Oral  Take 1 tablet (50 mg total) by mouth at bedtime.   90 tablet   2     Allergies Review of patient's allergies indicates no known allergies.  Family History  Problem Relation Age of Onset  . Nephrolithiasis Paternal Grandfather   . Kidney disease Neg Hx   . Prostate cancer Neg Hx   . Heart disease Mother   . Heart disease Father   . Diabetes Father     Social History Social History  Substance Use Topics  . Smoking status: Current Every Day Smoker -- 1.00 packs/day for 50 years    Types: Cigarettes  . Smokeless tobacco: Never Used  . Alcohol Use: No     Comment: No alcohol since Oct. 11, 2016    Review of Systems  Constitutional: Negative for fever. Eyes: Negative for redness ENT: Negative for sore throat Cardiovascular: Negative for chest pain Respiratory: Negative for shortness of breath. Gastrointestinal: Negative for abdominal pain Genitourinary: Negative for  dysuria. Musculoskeletal: Negative for back pain. Skin: Negative for rash. Neurological: Negative for focal weakness Psychiatric: no anxiety    ____________________________________________   PHYSICAL EXAM:  VITAL SIGNS: ED Triage Vitals  Enc Vitals Group     BP 03/24/16 1410 109/84 mmHg     Pulse Rate 03/24/16 1410 71     Resp 03/24/16 1410 18     Temp 03/24/16 1410 98.2 F (36.8 C)     Temp Source 03/24/16 1410 Oral     SpO2 03/24/16 1410 97 %     Weight 03/24/16 1410 150 lb (68.04 kg)     Height 03/24/16 1410 5\' 7"  (1.702 m)     Head Cir --      Peak Flow --      Pain Score --      Pain Loc --      Pain Edu? --      Excl. in [place]? --      Constitutional: Alert and oriented. Well appearing and in no distress.  Eyes: Conjunctivae are normal. No erythema or injection ENT   Head: Normocephalic and atraumatic.   Mouth/Throat: Mucous membranes are moist. Cardiovascular: Normal rate, regular rhythm. Normal and symmetric distal pulses are present in the upper extremities.  Respiratory: Normal respiratory effort without tachypnea nor retractions. Breath sounds are clear and equal bilaterally.  Gastrointestinal: Soft and non-tender in all quadrants. No distention. There is no CVA tenderness. Genitourinary: deferred Musculoskeletal: Nontender with normal range of motion in all extremities. No lower extremity tenderness nor edema. Neurologic:  Normal speech and language. No gross focal neurologic deficits are appreciated. Skin:  Skin is warm, dry and intact. No rash noted. Psychiatric: Mood and affect are normal. Patient exhibits appropriate insight and judgment.  ____________________________________________    LABS (pertinent positives/negatives)  Labs Reviewed  BASIC METABOLIC PANEL - Abnormal; Notable for the following:    Sodium 130 (*)    Chloride 93 (*)    Glucose, Bld 380 (*)    BUN 27 (*)    Creatinine, Ser 1.94 (*)    GFR calc non Af Amer 35 (*)    GFR  calc Af Amer 41 (*)    All other components within normal limits  CBC - Abnormal; Notable for the following:    WBC 13.9 (*)    RBC 4.11 (*)    HCT 39.9 (*)    RDW 14.8 (*)    All other components within normal limits  URINALYSIS COMPLETEWITH MICROSCOPIC ([facility] ONLY) - Abnormal; Notable  for the following:    Color, Urine YELLOW (*)    APPearance CLEAR (*)    Glucose, UA >500 (*)    Protein, ur 30 (*)    All other components within normal limits  GLUCOSE, CAPILLARY - Abnormal; Notable for the following:    Glucose-Capillary 384 (*)    All other components within normal limits  CBG MONITORING, ED    ____________________________________________   EKG  None  ____________________________________________    RADIOLOGY  None  ____________________________________________   PROCEDURES  Procedure(s) performed: none  Critical Care performed: none  ____________________________________________   INITIAL IMPRESSION / ASSESSMENT AND PLAN / ED COURSE  Pertinent labs & imaging results that were available during my care of the patient were reviewed by me and considered in my medical decision making (see chart for details).  Patient presents with reports of abnormal lab values, we do not have access to the results. We will recheck labs.  ----------------------------------------- 3:07 PM on 03/24/2016 -----------------------------------------  Patient with apparent new onset diabetes as well as evidence of dehydration/increased creatinine on his labs.We will give IV fluids  ----------------------------------------- 4:03 PM on 03/24/2016 -----------------------------------------  Discussed with patient's nurse at the [facility], they will arrange close follow-up if we are able to hydrate the patient here. Given normal LFTs I will start the patient on metformin and discharge   ____________________________________________   FINAL CLINICAL IMPRESSION(S) / ED DIAGNOSES  Final  diagnoses:  Type 2 diabetes mellitus without complication, without long-term current use of insulin ([place])  Dehydration          Lavonia Drafts, MD 03/24/16 1621

## 2016-03-24 NOTE — ED Notes (Signed)
Pt referred to here from MD who is [facility] (Dr. Sherral Hammers).  Pt went to have labs drawn [facility] hospital in [place].  Was told to come to ED for glucose >400.  Pt denise polydipsia and polyuria.

## 2016-03-24 NOTE — Discharge Instructions (Signed)
Dehydration, Adult Dehydration is a condition in which you do not have enough fluid or water in your body. It happens when you take in less fluid than you lose. Vital organs such as the kidneys, brain, and heart cannot function without a proper amount of fluids. Any loss of fluids from the body can cause dehydration.  Dehydration can range from mild to severe. This condition should be treated right away to help prevent it from becoming severe. CAUSES  This condition may be caused by:  Vomiting.  Diarrhea.  Excessive sweating, such as when exercising in hot or humid weather.  Not drinking enough fluid during strenuous exercise or during an illness.  Excessive urine output.  Fever.  Certain medicines. RISK FACTORS This condition is more likely to develop in:  People who are taking certain medicines that cause the body to lose excess fluid (diuretics).   People who have a chronic illness, such as diabetes, that may increase urination.  Older adults.   People who live at high altitudes.   People who participate in endurance sports.  SYMPTOMS  Mild Dehydration  Thirst.  Dry lips.  Slightly dry mouth.  Dry, warm skin. Moderate Dehydration  Very dry mouth.   Muscle cramps.   Dark urine and decreased urine production.   Decreased tear production.   Headache.   Light-headedness, especially when you stand up from a sitting position.  Severe Dehydration  Changes in skin.   Cold and clammy skin.   Skin does not spring back quickly when lightly pinched and released.   Changes in body fluids.   Extreme thirst.   No tears.   Not able to sweat when body temperature is high, such as in hot weather.   Minimal urine production.   Changes in vital signs.   Rapid, weak pulse (more than 100 beats per minute when you are sitting still).   Rapid breathing.   Low blood pressure.   Other changes.   Sunken eyes.   Cold hands and feet.    Confusion.  Lethargy and difficulty being awakened.  Fainting (syncope).   Short-term weight loss.   Unconsciousness. DIAGNOSIS  This condition may be diagnosed based on your symptoms. You may also have tests to determine how severe your dehydration is. These tests may include:   Urine tests.   Blood tests.  TREATMENT  Treatment for this condition depends on the severity. Mild or moderate dehydration can often be treated at home. Treatment should be started right away. Do not wait until dehydration becomes severe. Severe dehydration needs to be treated at the hospital. Treatment for Mild Dehydration  Drinking plenty of water to replace the fluid you have lost.   Replacing minerals in your blood (electrolytes) that you may have lost.  Treatment for Moderate Dehydration  Consuming oral rehydration solution (ORS). Treatment for Severe Dehydration  Receiving fluid through an IV tube.   Receiving electrolyte solution through a feeding tube that is passed through your nose and into your stomach (nasogastric tube or NG tube).  Correcting any abnormalities in electrolytes. HOME CARE INSTRUCTIONS   Drink enough fluid to keep your urine clear or pale yellow.   Drink water or fluid slowly by taking small sips. You can also try sucking on ice cubes.  Have food or beverages that contain electrolytes. Examples include bananas and sports drinks.  Take over-the-counter and prescription medicines only as told by your health care provider.   Prepare ORS according to the manufacturer's instructions. Take sips  of ORS every 5 minutes until your urine returns to normal.  If you have vomiting or diarrhea, continue to try to drink water, ORS, or both.   If you have diarrhea, avoid:   Beverages that contain caffeine.   Fruit juice.   Milk.   Carbonated soft drinks.  Do not take salt tablets. This can lead to the condition of having too much sodium in your body  (hypernatremia).  SEEK MEDICAL CARE IF:  You cannot eat or drink without vomiting.  You have had moderate diarrhea during a period of more than 24 hours.  You have a fever. SEEK IMMEDIATE MEDICAL CARE IF:   You have extreme thirst.  You have severe diarrhea.  You have not urinated in 6-8 hours, or you have urinated only a small amount of very dark urine.  You have shriveled skin.  You are dizzy, confused, or both.   This information is not intended to replace advice given to you by your health care provider. Make sure you discuss any questions you have with your health care provider.   Document Released: 09/26/2005 Document Revised: 06/17/2015 Document Reviewed: 02/11/2015 Elsevier Interactive Patient Education 2016 [employer]  Type 2 Diabetes Mellitus, Adult Type 2 diabetes mellitus, often simply referred to as type 2 diabetes, is a long-lasting (chronic) disease. In type 2 diabetes, the pancreas does not make enough insulin (a hormone), the cells are less responsive to the insulin that is made (insulin resistance), or both. Normally, insulin moves sugars from food into the tissue cells. The tissue cells use the sugars for energy. The lack of insulin or the lack of normal response to insulin causes excess sugars to build up in the blood instead of going into the tissue cells. As a result, high blood sugar (hyperglycemia) develops. The effect of high sugar (glucose) levels can cause many complications. Type 2 diabetes was also previously called adult-onset diabetes, but it can occur at any age.  RISK FACTORS  A person is predisposed to developing type 2 diabetes if someone in the family has the disease and also has one or more of the following primary risk factors:  Weight gain, or being overweight or obese.  An inactive lifestyle.  A history of consistently eating high-calorie foods. Maintaining a normal weight and regular physical activity can reduce the chance of  developing type 2 diabetes. SYMPTOMS  A person with type 2 diabetes may not show symptoms initially. The symptoms of type 2 diabetes appear slowly. The symptoms include:  Increased thirst (polydipsia).  Increased urination (polyuria).  Increased urination during the night (nocturia).  Sudden or unexplained weight changes.  Frequent, recurring infections.  Tiredness (fatigue).  Weakness.  Vision changes, such as blurred vision.  Fruity smell to your breath.  Abdominal pain.  Nausea or vomiting.  Cuts or bruises which are slow to heal.  Tingling or numbness in the hands or feet.  An open skin wound (ulcer). DIAGNOSIS Type 2 diabetes is frequently not diagnosed until complications of diabetes are present. Type 2 diabetes is diagnosed when symptoms or complications are present and when blood glucose levels are increased. Your blood glucose level may be checked by one or more of the following blood tests:  A fasting blood glucose test. You will not be allowed to eat for at least 8 hours before a blood sample is taken.  A random blood glucose test. Your blood glucose is checked at any time of the day regardless of when you ate.  A  hemoglobin A1c blood glucose test. A hemoglobin A1c test provides information about blood glucose control over the previous 3 months.  An oral glucose tolerance test (OGTT). Your blood glucose is measured after you have not eaten (fasted) for 2 hours and then after you drink a glucose-containing beverage. TREATMENT   You may need to take insulin or diabetes medicine daily to keep blood glucose levels in the desired range.  If you use insulin, you may need to adjust the dosage depending on the carbohydrates that you eat with each meal or snack.  Lifestyle changes are recommended as part of your treatment. These may include:  Following an individualized diet plan developed by a nutritionist or dietitian.  Exercising daily. Your health care  providers will set individualized treatment goals for you based on your age, your medicines, how long you have had diabetes, and any other medical conditions you have. Generally, the goal of treatment is to maintain the following blood glucose levels:  Before meals (preprandial): 80-130 mg/dL.  After meals (postprandial): below 180 mg/dL.  A1c: less than 6.5-7%. HOME CARE INSTRUCTIONS   Have your hemoglobin A1c level checked twice a year.  Perform daily blood glucose monitoring as directed by your health care provider.  Monitor urine ketones when you are ill and as directed by your health care provider.  Take your diabetes medicine or insulin as directed by your health care provider to maintain your blood glucose levels in the desired range.  Never run out of diabetes medicine or insulin. It is needed every day.  If you are using insulin, you may need to adjust the amount of insulin given based on your intake of carbohydrates. Carbohydrates can raise blood glucose levels but need to be included in your diet. Carbohydrates provide vitamins, minerals, and fiber which are an essential part of a healthy diet. Carbohydrates are found in fruits, vegetables, whole grains, dairy products, legumes, and foods containing added sugars.  Eat healthy foods. You should make an appointment to see a registered dietitian to help you create an eating plan that is right for you.  Lose weight if you are overweight.  Carry a medical alert card or wear your medical alert jewelry.  Carry a 15-gram carbohydrate snack with you at all times to treat low blood glucose (hypoglycemia). Some examples of 15-gram carbohydrate snacks include:  Glucose tablets, 3 or 4.  Glucose gel, 15-gram tube.  Raisins, 2 tablespoons (24 grams).  Jelly beans, 6.  Animal crackers, 8.  Regular pop, 4 ounces (120 mL).  Gummy treats, 9.  Recognize hypoglycemia. Hypoglycemia occurs with blood glucose levels of 70 mg/dL and  below. The risk for hypoglycemia increases when fasting or skipping meals, during or after intense exercise, and during sleep. Hypoglycemia symptoms can include:  Tremors or shakes.  Decreased ability to concentrate.  Sweating.  Increased heart rate.  Headache.  Dry mouth.  Hunger.  Irritability.  Anxiety.  Restless sleep.  Altered speech or coordination.  Confusion.  Treat hypoglycemia promptly. If you are alert and able to safely swallow, follow the 15:15 rule:  Take 15-20 grams of rapid-acting glucose or carbohydrate. Rapid-acting options include glucose gel, glucose tablets, or 4 ounces (120 mL) of fruit juice, regular soda, or low-fat milk.  Check your blood glucose level 15 minutes after taking the glucose.  Take 15-20 grams more of glucose if the repeat blood glucose level is still 70 mg/dL or below.  Eat a meal or snack within 1 hour once blood glucose  levels return to normal.  Be alert to feeling very thirsty and urinating more frequently than usual, which are early signs of hyperglycemia. An early awareness of hyperglycemia allows for prompt treatment. Treat hyperglycemia as directed by your health care provider.  Engage in at least 150 minutes of moderate-intensity physical activity a week, spread over at least 3 days of the week or as directed by your health care provider. In addition, you should engage in resistance exercise at least 2 times a week or as directed by your health care provider. Try to spend no more than 90 minutes at one time inactive.  Adjust your medicine and food intake as needed if you start a new exercise or sport.  Follow your sick-day plan anytime you are unable to eat or drink as usual.  Do not use any tobacco products including cigarettes, chewing tobacco, or electronic cigarettes. If you need help quitting, ask your health care provider.  Limit alcohol intake to no more than 1 drink per day for nonpregnant women and 2 drinks per day  for men. You should drink alcohol only when you are also eating food. Talk with your health care provider whether alcohol is safe for you. Tell your health care provider if you drink alcohol several times a week.  Keep all follow-up visits as directed by your health care provider. This is important.  Schedule an eye exam soon after the diagnosis of type 2 diabetes and then annually.  Perform daily skin and foot care. Examine your skin and feet daily for cuts, bruises, redness, nail problems, bleeding, blisters, or sores. A foot exam by a health care provider should be done annually.  Brush your teeth and gums at least twice a day and floss at least once a day. Follow up with your dentist regularly.  Share your diabetes management plan with your workplace or school.  Keep your immunizations up to date. It is recommended that you receive a flu (influenza) vaccine every year. It is also recommended that you receive a pneumonia (pneumococcal) vaccine. If you are 53 years of age or older and have never received a pneumonia vaccine, this vaccine may be given as a series of two separate shots. Ask your health care provider which additional vaccines may be recommended.  Learn to manage stress.  Obtain ongoing diabetes education and support as needed.  Participate in or seek rehabilitation as needed to maintain or improve independence and quality of life. Request a physical or occupational therapy referral if you are having foot or hand numbness, or difficulties with grooming, dressing, eating, or physical activity. SEEK MEDICAL CARE IF:   You are unable to eat food or drink fluids for more than 6 hours.  You have nausea and vomiting for more than 6 hours.  Your blood glucose level is over 240 mg/dL.  There is a change in mental status.  You develop an additional serious illness.  You have diarrhea for more than 6 hours.  You have been sick or have had a fever for a couple of days and are not  getting better.  You have pain during any physical activity.  SEEK IMMEDIATE MEDICAL CARE IF:  You have difficulty breathing.  You have moderate to large ketone levels.   This information is not intended to replace advice given to you by your health care provider. Make sure you discuss any questions you have with your health care provider.   Document Released: 09/26/2005 Document Revised: 06/17/2015 Document Reviewed: 04/24/2012 Elsevier  Interactive Patient Education 2016 [place].  Hyperglycemia Hyperglycemia occurs when the glucose (sugar) in your blood is too high. Hyperglycemia can happen for many reasons, but it most often happens to people who do not know they have diabetes or are not managing their diabetes properly.  CAUSES  Whether you have diabetes or not, there are other causes of hyperglycemia. Hyperglycemia can occur when you have diabetes, but it can also occur in other situations that you might not be as aware of, such as: Diabetes  If you have diabetes and are having problems controlling your blood glucose, hyperglycemia could occur because of some of the following reasons:  Not following your meal plan.  Not taking your diabetes medications or not taking it properly.  Exercising less or doing less activity than you normally do.  Being sick. Pre-diabetes  This cannot be ignored. Before people develop Type 2 diabetes, they almost always have "pre-diabetes." This is when your blood glucose levels are higher than normal, but not yet high enough to be diagnosed as diabetes. Research has shown that some long-term damage to the body, especially the heart and circulatory system, may already be occurring during pre-diabetes. If you take action to manage your blood glucose when you have pre-diabetes, you may delay or prevent Type 2 diabetes from developing. Stress  If you have diabetes, you may be "diet" controlled or on oral medications or insulin to control your  diabetes. However, you may find that your blood glucose is higher than usual in the hospital whether you have diabetes or not. This is often referred to as "stress hyperglycemia." Stress can elevate your blood glucose. This happens because of hormones put out by the body during times of stress. If stress has been the cause of your high blood glucose, it can be followed regularly by your caregiver. That way he/she can make sure your hyperglycemia does not continue to get worse or progress to diabetes. Steroids  Steroids are medications that act on the infection fighting system (immune system) to block inflammation or infection. One side effect can be a rise in blood glucose. Most people can produce enough extra insulin to allow for this rise, but for those who cannot, steroids make blood glucose levels go even higher. It is not unusual for steroid treatments to "uncover" diabetes that is developing. It is not always possible to determine if the hyperglycemia will go away after the steroids are stopped. A special blood test called an A1c is sometimes done to determine if your blood glucose was elevated before the steroids were started. SYMPTOMS  Thirsty.  Frequent urination.  Dry mouth.  Blurred vision.  Tired or fatigue.  Weakness.  Sleepy.  Tingling in feet or leg. DIAGNOSIS  Diagnosis is made by monitoring blood glucose in one or all of the following ways:  A1c test. This is a chemical found in your blood.  Fingerstick blood glucose monitoring.  Laboratory results. TREATMENT  First, knowing the cause of the hyperglycemia is important before the hyperglycemia can be treated. Treatment may include, but is not be limited to:  Education.  Change or adjustment in medications.  Change or adjustment in meal plan.  Treatment for an illness, infection, etc.  More frequent blood glucose monitoring.  Change in exercise plan.  Decreasing or stopping steroids.  Lifestyle  changes. HOME CARE INSTRUCTIONS   Test your blood glucose as directed.  Exercise regularly. Your caregiver will give you instructions about exercise. Pre-diabetes or diabetes which comes on with  stress is helped by exercising.  Eat wholesome, balanced meals. Eat often and at regular, fixed times. Your caregiver or nutritionist will give you a meal plan to guide your sugar intake.  Being at an ideal weight is important. If needed, losing as little as 10 to 15 pounds may help improve blood glucose levels. SEEK MEDICAL CARE IF:   You have questions about medicine, activity, or diet.  You continue to have symptoms (problems such as increased thirst, urination, or weight gain). SEEK IMMEDIATE MEDICAL CARE IF:   You are vomiting or have diarrhea.  Your breath smells fruity.  You are breathing faster or slower.  You are very sleepy or incoherent.  You have numbness, tingling, or pain in your feet or hands.  You have chest pain.  Your symptoms get worse even though you have been following your caregiver's orders.  If you have any other questions or concerns.   This information is not intended to replace advice given to you by your health care provider. Make sure you discuss any questions you have with your health care provider.   Document Released: 03/22/2001 Document Revised: 12/19/2011 Document Reviewed: 06/02/2015 Elsevier Interactive Patient Education [employer].

## 2016-04-11 ENCOUNTER — Other Ambulatory Visit: Payer: Self-pay | Admitting: *Deleted

## 2016-04-11 MED ORDER — AMLODIPINE BESYLATE 5 MG PO TABS: 5.0000 mg | ORAL_TABLET | Freq: Every day | ORAL | Status: DC

## 2016-04-11 NOTE — Telephone Encounter (Signed)
Requested Prescriptions   Signed Prescriptions Disp Refills  . amLODipine (NORVASC) 5 MG tablet 30 tablet 3    Sig: Take 1 tablet (5 mg total) by mouth daily.    Authorizing Provider: Kathlyn [place] A    Ordering User: Britt Bottom

## 2016-11-08 ENCOUNTER — Ambulatory Visit (INDEPENDENT_AMBULATORY_CARE_PROVIDER_SITE_OTHER): Payer: Self-pay | Admitting: Family Medicine

## 2016-11-08 ENCOUNTER — Encounter: Payer: Self-pay | Admitting: Family Medicine

## 2016-11-08 DIAGNOSIS — F1027 Alcohol dependence with alcohol-induced persisting dementia: Secondary | ICD-10-CM

## 2016-11-08 DIAGNOSIS — R4586 Emotional lability: Secondary | ICD-10-CM

## 2016-11-08 DIAGNOSIS — F1010 Alcohol abuse, uncomplicated: Secondary | ICD-10-CM

## 2016-11-08 DIAGNOSIS — F0391 Unspecified dementia with behavioral disturbance: Secondary | ICD-10-CM

## 2016-11-08 DIAGNOSIS — F1097 Alcohol use, unspecified with alcohol-induced persisting dementia: Secondary | ICD-10-CM

## 2016-11-08 DIAGNOSIS — F39 Unspecified mood [affective] disorder: Secondary | ICD-10-CM

## 2016-11-08 NOTE — Assessment & Plan Note (Signed)
Remains sober. Doing well.  Advised continued sobriety.

## 2016-11-08 NOTE — Assessment & Plan Note (Signed)
Stable on Lexapro

## 2016-11-08 NOTE — Assessment & Plan Note (Signed)
Stable at this time 

## 2016-11-08 NOTE — Progress Notes (Signed)
   Subjective:  Patient ID: Edward Singleton, male    DOB: 10/05/1951  Age: 66 y.o. MRN: [id number]  CC: Follow up; Needs form filled out.  HPI:  66 year old male with a complicated PMH including CAD, PAD, CKD, Alcohol abuse, Tobacco abuse, Dementia, HTN, HLD presents for follow up.  Alcohol abuse  Doing well.  Remains sober. Sister states confirms sobriety.  He needs a form filled out today regarding disability.  Dementia, mood disturbance  Stable at this time.  He is doing well.  Currently on Lexapro.  Social Hx   Social History   Social History  . Marital status: Single    Spouse name: N/A  . Number of children: N/A  . Years of education: N/A   Social History Main Topics  . Smoking status: Current Every Day Smoker    Packs/day: 1.00    Years: 50.00    Types: Cigarettes  . Smokeless tobacco: Never Used  . Alcohol use No     Comment: No alcohol since Oct. 11, 2016  . Drug use: No  . Sexual activity: Not Asked   Other Topics Concern  . None   Social History Narrative  . None   Review of Systems  Constitutional: Negative.   Respiratory: Negative.   Cardiovascular: Negative.   Psychiatric/Behavioral: Negative for behavioral problems.   Objective:  BP (!) 167/92   Pulse 77   Temp 98.1 F (36.7 C) (Oral)   Wt 158 lb (71.7 kg)   SpO2 97%   BMI 24.75 kg/m   BP/Weight 11/08/2016 03/24/2016 0/15/6153  Systolic BP 794 327 614  Diastolic BP 92 84 80  Wt. (Lbs) 158 150 152  BMI 24.75 23.49 23.44   Physical Exam  Constitutional: He is oriented to person, place, and time. He appears well-developed. No distress.  Cardiovascular: Normal rate and regular rhythm.   Pulmonary/Chest: Effort normal and breath sounds normal.  Neurological: He is alert and oriented to person, place, and time.  Psychiatric: He has a normal mood and affect.  Vitals reviewed.  Assessment & Plan:   Problem List Items Addressed This Visit    Mood disturbance ([place])    Stable on  Lexapro.      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      Alcohol abuse    Remains sober. Doing well.  Advised continued sobriety.        Follow-up: PRN (follows predominantly with the [facility])  [place]

## 2017-11-02 ENCOUNTER — Other Ambulatory Visit: Payer: Self-pay | Admitting: *Deleted

## 2017-11-02 DIAGNOSIS — I739 Peripheral vascular disease, unspecified: Secondary | ICD-10-CM

## 2018-08-02 ENCOUNTER — Ambulatory Visit: Payer: Self-pay | Admitting: Orthopedic Surgery

## 2018-08-28 ENCOUNTER — Other Ambulatory Visit: Payer: Self-pay | Admitting: Orthopedic Surgery

## 2018-08-28 DIAGNOSIS — S42212D Unspecified displaced fracture of surgical neck of left humerus, subsequent encounter for fracture with routine healing: Secondary | ICD-10-CM

## 2018-09-05 ENCOUNTER — Ambulatory Visit
Admission: RE | Admit: 2018-09-05 | Discharge: 2018-09-05 | Disposition: A | Discharge status: Home / Self Care | Payer: No Typology Code available for payment source | Source: Ambulatory Visit | Attending: Orthopedic Surgery | Admitting: Orthopedic Surgery

## 2018-09-05 DIAGNOSIS — S42212D Unspecified displaced fracture of surgical neck of left humerus, subsequent encounter for fracture with routine healing: Secondary | ICD-10-CM | POA: Diagnosis not present

## 2018-09-10 ENCOUNTER — Other Ambulatory Visit: Payer: Self-pay

## 2018-09-10 ENCOUNTER — Telehealth: Payer: Self-pay

## 2018-09-10 ENCOUNTER — Encounter
Admission: RE | Admit: 2018-09-10 | Discharge: 2018-09-10 | Disposition: A | Discharge status: Home / Self Care | Payer: No Typology Code available for payment source | Source: Ambulatory Visit | Attending: Orthopedic Surgery | Admitting: Orthopedic Surgery

## 2018-09-10 DIAGNOSIS — I10 Essential (primary) hypertension: Secondary | ICD-10-CM | POA: Insufficient documentation

## 2018-09-10 DIAGNOSIS — R9431 Abnormal electrocardiogram [ECG] [EKG]: Secondary | ICD-10-CM | POA: Diagnosis not present

## 2018-09-10 DIAGNOSIS — Z01818 Encounter for other preprocedural examination: Secondary | ICD-10-CM | POA: Insufficient documentation

## 2018-09-10 HISTORY — DX: Type 2 diabetes mellitus without complications: E11.9

## 2018-09-10 LAB — CBC
HEMATOCRIT: 44 % (ref 39.0–52.0)
Hemoglobin: 15.1 g/dL (ref 13.0–17.0)
MCH: 33.3 pg (ref 26.0–34.0)
MCHC: 34.3 g/dL (ref 30.0–36.0)
MCV: 97.1 fL (ref 80.0–100.0)
NRBC: 0 % (ref 0.0–0.2)
Platelets: 389 10*3/uL (ref 150–400)
RBC: 4.53 MIL/uL (ref 4.22–5.81)
RDW: 14.4 % (ref 11.5–15.5)
WBC: 11.6 10*3/uL — AB (ref 4.0–10.5)

## 2018-09-10 LAB — BASIC METABOLIC PANEL
ANION GAP: 13 (ref 5–15)
BUN: 33 mg/dL — ABNORMAL HIGH (ref 8–23)
CHLORIDE: 98 mmol/L (ref 98–111)
CO2: 22 mmol/L (ref 22–32)
Calcium: 9.5 mg/dL (ref 8.9–10.3)
Creatinine, Ser: 4.22 mg/dL — ABNORMAL HIGH (ref 0.61–1.24)
GFR calc non Af Amer: 14 mL/min — ABNORMAL LOW (ref >60)
GFR, EST AFRICAN AMERICAN: 16 mL/min — AB (ref >60)
Glucose, Bld: 138 mg/dL — ABNORMAL HIGH (ref 70–99)
POTASSIUM: 3.3 mmol/L — AB (ref 3.5–5.1)
SODIUM: 133 mmol/L — AB (ref 135–145)

## 2018-09-10 LAB — URINALYSIS, ROUTINE W REFLEX MICROSCOPIC
BILIRUBIN URINE: NEGATIVE
Bacteria, UA: NONE SEEN
Glucose, UA: NEGATIVE mg/dL
Ketones, ur: NEGATIVE mg/dL
Leukocytes, UA: NEGATIVE
NITRITE: NEGATIVE
Protein, ur: 100 mg/dL — AB
Specific Gravity, Urine: 1.015 (ref 1.005–1.030)
pH: 6 (ref 5.0–8.0)

## 2018-09-10 LAB — TYPE AND SCREEN
ABO/RH(D): A POS
Antibody Screen: NEGATIVE

## 2018-09-10 LAB — PROTIME-INR
INR: 0.97
Prothrombin Time: 12.8 seconds (ref 11.4–15.2)

## 2018-09-10 LAB — SURGICAL PCR SCREEN
MRSA, PCR: NEGATIVE
Staphylococcus aureus: NEGATIVE

## 2018-09-10 LAB — APTT: aPTT: 30 seconds (ref 24–36)

## 2018-09-10 NOTE — Telephone Encounter (Signed)
Copied from [id number] [id number]. Topic: General - Inquiry >> Sep 10, 2018  4:12 PM Sheran Luz wrote: Reason for CRM: Edward Singleton from [facility] pre admission calling to inquire if office had received surgical clearance form that was sent to office. Advised patient that Dr Lacinda Axon was no longer with practice so patient would need to be seen. Edward Singleton states that patients surgery is on 12/11 so the timeline is relatively short. Please advise.

## 2018-09-10 NOTE — Patient Instructions (Signed)
Your procedure is scheduled on: 09/19/18 Wed Report to Same Day Surgery 2nd floor medical mall [facility] Entrance-take elevator on left to 2nd floor.  Check in with surgery information desk.) To find out your arrival time please call [phone] between 1PM - 3PM on 09/18/18 Tues Remember: Instructions that are not followed completely may result in serious medical risk, up to and including death, or upon the discretion of your surgeon and anesthesiologist your surgery may need to be rescheduled.    _x___ 1. Do not eat food after midnight the night before your procedure. You may drink clear liquids up to 2 hours before you are scheduled to arrive at the hospital for your procedure.  Do not drink clear liquids within 2 hours of your scheduled arrival to the hospital.  Clear liquids include  --Water or Apple juice without pulp  --Clear carbohydrate beverage such as ClearFast or Gatorade  --Black Coffee or Clear Tea (No milk, no creamers, do not add anything to                  the coffee or Tea Type 1 and type 2 diabetics should only drink water.   ____Ensure clear carbohydrate drink on the way to the hospital for bariatric patients  ____Ensure clear carbohydrate drink 3 hours before surgery for Dr Dwyane Luo patients if physician instructed.   No gum chewing or hard candies.     __x__ 2. No Alcohol for 24 hours before or after surgery.   __x__3. No Smoking or e-cigarettes for 24 prior to surgery.  Do not use any chewable tobacco products for at least 6 hour prior to surgery   ____  4. Bring all medications with you on the day of surgery if instructed.    __x__ 5. Notify your doctor if there is any change in your medical condition     (cold, fever, infections).    x___6. On the morning of surgery brush your teeth with toothpaste and water.  You may rinse your mouth with mouth wash if you wish.  Do not swallow any toothpaste or mouthwash.   Do not wear jewelry, make-up, hairpins,  clips or nail polish.  Do not wear lotions, powders, or perfumes. You may wear deodorant.  Do not shave 48 hours prior to surgery. Men may shave face and neck.  Do not bring valuables to the hospital.    [facility] is not responsible for any belongings or valuables.               Contacts, dentures or bridgework may not be worn into surgery.  Leave your suitcase in the car. After surgery it may be brought to your room.  For patients admitted to the hospital, discharge time is determined by your                       treatment team.  _  Patients discharged the day of surgery will not be allowed to drive home.  You will need someone to drive you home and stay with you the night of your procedure.    Please read over the following fact sheets that you were given:   [facility] Preparing for Surgery and or MRSA Information   _x___ Take anti-hypertensive listed below, cardiac, seizure, asthma,     anti-reflux and psychiatric medicines. These include:  1. allopurinol (ZYLOPRIM)   2.amLODipine (NORVASC  3.escitalopram (LEXAPRO)   4.metoprolol succinate   5.  6.  ____Fleets  enema or Magnesium Citrate as directed.   _x___ Use CHG Soap or sage wipes as directed on instruction sheet   ____ Use inhalers on the day of surgery and bring to hospital day of surgery  __x__ Stop Metformin and Janumet 2 days prior to surgery.    ____ Take 1/2 of usual insulin dose the night before surgery and none on the morning     surgery.   _x___ Follow recommendations from Cardiologist, Pulmonologist or PCP regarding          stopping Aspirin, Coumadin, Plavix ,Eliquis, Effient, or Pradaxa, and Pletal. To stop Aspirin 1 week before surgery per MD instruction  X____Stop Anti-inflammatories such as Advil, Aleve, Ibuprofen, Motrin, Naproxen, Naprosyn, Goodies powders or aspirin products. OK to take Tylenol and                          Celebrex.   _x___ Stop supplements until after surgery.  But may continue  Vitamin D, Vitamin B,       and multivitamin.   ____ Bring C-Pap to the hospital.

## 2018-09-10 NOTE — Telephone Encounter (Signed)
He needs to establish care and be seen in the office for a clearance visit with one of the providers that are accepting patients. It also appears that he will need to see cardiology for clearance base on review of his EKG. You can contact the patient and offer him an appointment with a provider that is accepting patients.

## 2018-09-10 NOTE — Pre-Procedure Instructions (Addendum)
Dr Amie Critchley notified regarding abnormal labs, and abnormal EKG. Dr Jonathon Jordan office and Dr Alvira Philips office notified regarding need for medical clearance. Called Chauncy Passy, sister and legal guardian, regarding abnormal labs and abnormal EKG and the need to see primary care physician.

## 2018-09-11 NOTE — Pre-Procedure Instructions (Signed)
UA results sent to Dr. Harlow Mares for review.

## 2018-09-17 NOTE — Telephone Encounter (Signed)
Patients POA stated it was a mistake and patients PCP is the [place].  He no longer needs to establish care here.

## 2018-09-19 ENCOUNTER — Encounter: Payer: Self-pay | Source: Ambulatory Visit

## 2018-09-19 ENCOUNTER — Inpatient Hospital Stay: Admit: 2018-09-19 | Payer: Non-veteran care | Source: Ambulatory Visit | Admitting: Orthopedic Surgery

## 2018-09-19 SURGERY — ARTHROPLASTY, SHOULDER, TOTAL
Anesthesia: Choice | Laterality: Left

## 2018-09-26 ENCOUNTER — Emergency Department
Admission: EM | Admit: 2018-09-26 | Discharge: 2018-09-26 | Disposition: A | Discharge status: Home / Self Care | Payer: Medicare Other | Attending: Emergency Medicine | Admitting: Emergency Medicine

## 2018-09-26 ENCOUNTER — Encounter: Payer: Self-pay | Admitting: Emergency Medicine

## 2018-09-26 DIAGNOSIS — N183 Chronic kidney disease, stage 3 (moderate): Secondary | ICD-10-CM | POA: Insufficient documentation

## 2018-09-26 DIAGNOSIS — E1122 Type 2 diabetes mellitus with diabetic chronic kidney disease: Secondary | ICD-10-CM | POA: Insufficient documentation

## 2018-09-26 DIAGNOSIS — I129 Hypertensive chronic kidney disease with stage 1 through stage 4 chronic kidney disease, or unspecified chronic kidney disease: Secondary | ICD-10-CM | POA: Insufficient documentation

## 2018-09-26 DIAGNOSIS — F17210 Nicotine dependence, cigarettes, uncomplicated: Secondary | ICD-10-CM | POA: Diagnosis not present

## 2018-09-26 DIAGNOSIS — R899 Unspecified abnormal finding in specimens from other organs, systems and tissues: Secondary | ICD-10-CM | POA: Insufficient documentation

## 2018-09-26 DIAGNOSIS — F0390 Unspecified dementia without behavioral disturbance: Secondary | ICD-10-CM | POA: Diagnosis not present

## 2018-09-26 DIAGNOSIS — Z7982 Long term (current) use of aspirin: Secondary | ICD-10-CM | POA: Insufficient documentation

## 2018-09-26 DIAGNOSIS — I2510 Atherosclerotic heart disease of native coronary artery without angina pectoris: Secondary | ICD-10-CM | POA: Insufficient documentation

## 2018-09-26 DIAGNOSIS — Z79899 Other long term (current) drug therapy: Secondary | ICD-10-CM | POA: Insufficient documentation

## 2018-09-26 DIAGNOSIS — Z7984 Long term (current) use of oral hypoglycemic drugs: Secondary | ICD-10-CM | POA: Insufficient documentation

## 2018-09-26 LAB — BASIC METABOLIC PANEL
Anion gap: 8 (ref 5–15)
BUN: 9 mg/dL (ref 8–23)
CO2: 25 mmol/L (ref 22–32)
Calcium: 9.6 mg/dL (ref 8.9–10.3)
Chloride: 102 mmol/L (ref 98–111)
Creatinine, Ser: 1.67 mg/dL — ABNORMAL HIGH (ref 0.61–1.24)
GFR calc Af Amer: 49 mL/min — ABNORMAL LOW (ref >60)
GFR calc non Af Amer: 42 mL/min — ABNORMAL LOW (ref >60)
Glucose, Bld: 104 mg/dL — ABNORMAL HIGH (ref 70–99)
Potassium: 5.2 mmol/L — ABNORMAL HIGH (ref 3.5–5.1)
Sodium: 135 mmol/L (ref 135–145)

## 2018-09-26 LAB — CBC
HCT: 41.8 % (ref 39.0–52.0)
Hemoglobin: 13.9 g/dL (ref 13.0–17.0)
MCH: 33.7 pg (ref 26.0–34.0)
MCHC: 33.3 g/dL (ref 30.0–36.0)
MCV: 101.2 fL — ABNORMAL HIGH (ref 80.0–100.0)
Platelets: 405 10*3/uL — ABNORMAL HIGH (ref 150–400)
RBC: 4.13 MIL/uL — ABNORMAL LOW (ref 4.22–5.81)
RDW: 14.9 % (ref 11.5–15.5)
WBC: 13.9 10*3/uL — ABNORMAL HIGH (ref 4.0–10.5)
nRBC: 0 % (ref 0.0–0.2)

## 2018-09-26 LAB — TROPONIN I: Troponin I: <0.03 ng/mL (ref <0.03)

## 2018-09-26 NOTE — Discharge Instructions (Addendum)
Your creatinine today is back at your baseline.  Follow-up with Dr. Sherral Hammers and with the orthopedist as scheduled.  Return to the ER for any new or worsening symptoms that concern you.

## 2018-09-26 NOTE — ED Provider Notes (Signed)
[facility] Emergency Department Provider Note ____________________________________________   First MD Initiated Contact with Patient 09/26/18 1320     (approximate)  I have reviewed the triage vital signs and the nursing notes.   HISTORY  Chief Complaint Abnormal Lab    HPI Edward Singleton is a 67 y.o. male who presents for evaluation after he was found to have an elevated creatinine on recent labs obtained for surgical clearance.  The patient denies any symptoms.  He is being evaluated for shoulder surgery after a recent injury.  He had labs drawn on 09/10/2018 and was called by his doctor and told that his creatinine was elevated and that he needed to come to the emergency department for evaluation.  Past Medical History:  Diagnosis Date  . Alcohol abuse   . Atrophic kidney   . Benign enlargement of prostate   . 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 Diabetes mellitus without complication ([place])   . Gout   . Gout   . Hypercholesteremia   . Hypertension   . Kidney stones   . Left flank pain, chronic   . Pelvic fracture ([place]) 11/17/2014    Patient Active Problem List   Diagnosis Date Noted  . Left rotator cuff tear arthropathy 12/28/2015  . Hyperlipidemia 12/24/2015  . Tobacco abuse 11/06/2015  . Mood disturbance 11/06/2015  . CAD (coronary artery disease) 10/16/2015  . PAD (peripheral artery disease) ([place]) 10/16/2015  . Preventative health care 08/13/2015  . Renal artery stenosis ([place]) 07/30/2015  . Kidney atrophy 07/30/2015  . CKD (chronic kidney disease), stage III ([place]) 07/30/2015  . Insomnia 07/22/2015  . History of substance abuse ([place]) 07/15/2015  . BPH (benign prostatic hyperplasia) 07/15/2015  . Gout 07/15/2015  . Kidney stones 05/11/2015  . 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 HTN (hypertension) 11/17/2014  . Alcohol abuse 11/17/2014    Past Surgical History:  Procedure Laterality Date  . left renal stent placement   2013  . LITHOTRIPSY    . ORIF HUMERUS FRACTURE Left 11/18/2014   Procedure: OPEN REDUCTION INTERNAL FIXATION (ORIF) DISTAL HUMERUS FRACTURE;  Surgeon: Rozanna Box, MD;  Location: [place];  Service: Orthopedics;  Laterality: Left;  . SPLENECTOMY      Prior to Admission medications   Medication Sig Start Date End Date Taking? Authorizing Provider  allopurinol (ZYLOPRIM) 300 MG tablet Take 0.5 tablets (150 mg total) by mouth daily. 12/28/15   Coral Spikes, DO  amLODipine (NORVASC) 5 MG tablet Take 1 tablet (5 mg total) by mouth daily. 04/11/16   Wellington Hampshire, MD  aspirin 81 MG tablet Take 81 mg by mouth daily.    [provider]  escitalopram (LEXAPRO) 20 MG tablet Take 0.5 tablets (10 mg total) by mouth daily. 12/28/15   Coral Spikes, DO  folic acid (FOLVITE) 1 MG tablet Take 1 tablet (1 mg total) by mouth daily. 08/13/15   Coral Spikes, DO  metFORMIN (GLUCOPHAGE) 500 MG tablet Take 500 mg by mouth 2 (two) times daily with a meal. Lunch and Higher education careers adviser, Historical, MD  metoprolol succinate (TOPROL-XL) 25 MG 24 hr tablet Take 1 tablet (25 mg total) by mouth daily. 12/28/15   Coral Spikes, DO  rosuvastatin (CRESTOR) 20 MG tablet Take one tab daily Patient taking differently: Take 20 mg by mouth daily. Take one tab daily 12/11/15   Coral Spikes, DO  thiamine (VITAMIN B-1) 100  MG tablet Take 100 mg by mouth daily.    [provider]  traZODone (DESYREL) 50 MG tablet Take 1 tablet (50 mg total) by mouth at bedtime. 12/28/15   Coral Spikes, DO    Allergies Patient has no known allergies.  Family History  Problem Relation Age of Onset  . Heart disease Mother   . Heart disease Father   . Diabetes Father   . Nephrolithiasis Paternal Grandfather   . Kidney disease Neg Hx   . Prostate cancer Neg Hx     Social History Social History   Tobacco Use  . Smoking status: Current Every Day Smoker    Packs/day: 1.00    Years: 50.00    Pack years: 50.00    Types:  Cigarettes  . Smokeless tobacco: Current User    Types: Chew  Substance Use Topics  . Alcohol use: No    Alcohol/week: 42.0 standard drinks    Types: 42 Cans of beer per week    Comment: No alcohol since Oct. 11, 2016  . Drug use: No    Review of Systems  Constitutional: No fever/chills.  No weakness. Eyes: No redness. ENT: No sore throat or congestion. Cardiovascular: Denies chest pain. Respiratory: Denies shortness of breath. Gastrointestinal: No vomiting or diarrhea.  Genitourinary: Negative for dysuria or oliguria.  Musculoskeletal: Negative for back pain. Skin: Negative for rash. Neurological: Negative for headache.   ____________________________________________   PHYSICAL EXAM:  VITAL SIGNS: ED Triage Vitals  Enc Vitals Group     BP 09/26/18 1233 (!) 161/88     Pulse Rate 09/26/18 1233 69     Resp 09/26/18 1233 17     Temp 09/26/18 1233 98.3 F (36.8 C)     Temp Source 09/26/18 1233 Oral     SpO2 09/26/18 1233 97 %     Weight 09/26/18 1234 155 lb (70.3 kg)     Height 09/26/18 1234 5\' 8"  (1.727 m)     Head Circumference --      Peak Flow --      Pain Score 09/26/18 1233 9     Pain Loc --      Pain Edu? --      Excl. in [place]? --     Constitutional: Alert and oriented. Well appearing and in no acute distress. Eyes: Conjunctivae are normal.  Head: Atraumatic. Nose: No congestion/rhinnorhea. Mouth/Throat: Mucous membranes are moist.   Neck: Normal range of motion.  Cardiovascular:  Good peripheral circulation. Respiratory: Normal respiratory effort. Gastrointestinal:  No distention.  Musculoskeletal: Extremities warm and well perfused.  Neurologic:  Normal speech and language. No gross focal neurologic deficits are appreciated.  Skin:  Skin is warm and dry. No rash noted. Psychiatric: Speech and behavior are normal.  ____________________________________________   LABS (all labs ordered are listed, but only abnormal results are displayed)  Labs  Reviewed  BASIC METABOLIC PANEL - Abnormal; Notable for the following components:      Result Value   Potassium 5.2 (*)    Glucose, Bld 104 (*)    Creatinine, Ser 1.67 (*)    GFR calc non Af Amer 42 (*)    GFR calc Af Amer 49 (*)    All other components within normal limits  CBC - Abnormal; Notable for the following components:   WBC 13.9 (*)    RBC 4.13 (*)    MCV 101.2 (*)    Platelets 405 (*)    All other components within normal limits  TROPONIN I   ____________________________________________  EKG  ED ECG REPORT I, Arta Silence, the attending physician, personally viewed and interpreted this ECG.  Date: 09/26/2018 EKG Time: 1243 Rate: 63 Rhythm: normal sinus rhythm QRS Axis: normal Intervals: normal ST/T Wave abnormalities: Nonspecific lateral ST abnormalities Narrative Interpretation: no evidence of acute ischemia; no significant change when compared to EKG of 09/10/2018  ____________________________________________  RADIOLOGY    ____________________________________________   PROCEDURES  Procedure(s) performed: No  Procedures  Critical Care performed: No ____________________________________________   INITIAL IMPRESSION / ASSESSMENT AND PLAN / ED COURSE  Pertinent labs & imaging results that were available during my care of the patient were reviewed by me and considered in my medical decision making (see chart for details).  67 year old male with PMH as noted above presents referred to the emergency department for an abnormal creatinine on labs obtained on 09/10/2018.  These labs are obtained for the purpose of medical clearance for surgery.  The patient denies any symptoms.  On exam today, his vital signs are normal except for hypertension.  He is well-appearing.  No concerning findings on exam.  Labs obtained today show that the patient's creatinine is only minimally elevated, and actually improved from 2 years ago.  The potassium is minimally  elevated although this is likely from hemolysis and the patient's EKG shows no findings consistent with true hyperkalemia.  His WBC count is elevated although this is most likely related to inflammation due to his shoulder injury and has been similarly elevated on prior labs.  Given that the patient is asymptomatic and the creatinine is normal for him, there is no indication for further ED work-up or admission.  The patient is stable for discharge home.  I counseled him and his family member who is his healthcare proxy on the results of the work-up.  They will follow-up with his PMD.  Return precautions given, and they expressed understanding.  ____________________________________________   FINAL CLINICAL IMPRESSION(S) / ED DIAGNOSES  Final diagnoses:  Abnormal laboratory test result      NEW MEDICATIONS STARTED DURING THIS VISIT:  Discharge Medication List as of 09/26/2018  2:01 PM       Note:  This document was prepared using Dragon voice recognition software and may include unintentional dictation errors.    Arta Silence, MD 09/26/18 1553

## 2018-09-26 NOTE — ED Triage Notes (Signed)
Patient presents to ED via POV from home with c/o abnormal lab. [facility] is [facility] and was instructed to come to ED due to creatine of 4.22. Patient only has 1 functioning kidney. Patient reports "I feel fine".

## 2019-02-20 ENCOUNTER — Other Ambulatory Visit: Payer: Self-pay

## 2019-02-20 ENCOUNTER — Emergency Department: Payer: No Typology Code available for payment source

## 2019-02-20 ENCOUNTER — Inpatient Hospital Stay
Admission: EM | Admit: 2019-02-20 | Discharge: 2019-02-23 | DRG: 641 | Discharge status: Against Medical Advice | Payer: No Typology Code available for payment source | Attending: Internal Medicine | Admitting: Internal Medicine

## 2019-02-20 ENCOUNTER — Encounter: Payer: Self-pay | Admitting: *Deleted

## 2019-02-20 DIAGNOSIS — Z8673 Personal history of transient ischemic attack (TIA), and cerebral infarction without residual deficits: Secondary | ICD-10-CM

## 2019-02-20 DIAGNOSIS — Z8249 Family history of ischemic heart disease and other diseases of the circulatory system: Secondary | ICD-10-CM

## 2019-02-20 DIAGNOSIS — N179 Acute kidney failure, unspecified: Secondary | ICD-10-CM | POA: Diagnosis present

## 2019-02-20 DIAGNOSIS — Z1159 Encounter for screening for other viral diseases: Secondary | ICD-10-CM

## 2019-02-20 DIAGNOSIS — E871 Hypo-osmolality and hyponatremia: Secondary | ICD-10-CM | POA: Diagnosis present

## 2019-02-20 DIAGNOSIS — N182 Chronic kidney disease, stage 2 (mild): Secondary | ICD-10-CM | POA: Diagnosis present

## 2019-02-20 DIAGNOSIS — Z7984 Long term (current) use of oral hypoglycemic drugs: Secondary | ICD-10-CM

## 2019-02-20 DIAGNOSIS — E119 Type 2 diabetes mellitus without complications: Secondary | ICD-10-CM

## 2019-02-20 DIAGNOSIS — F17210 Nicotine dependence, cigarettes, uncomplicated: Secondary | ICD-10-CM | POA: Diagnosis present

## 2019-02-20 DIAGNOSIS — F1027 Alcohol dependence with alcohol-induced persisting dementia: Secondary | ICD-10-CM | POA: Diagnosis present

## 2019-02-20 DIAGNOSIS — I10 Essential (primary) hypertension: Secondary | ICD-10-CM | POA: Diagnosis present

## 2019-02-20 DIAGNOSIS — F17220 Nicotine dependence, chewing tobacco, uncomplicated: Secondary | ICD-10-CM | POA: Diagnosis present

## 2019-02-20 DIAGNOSIS — E1122 Type 2 diabetes mellitus with diabetic chronic kidney disease: Secondary | ICD-10-CM | POA: Diagnosis present

## 2019-02-20 DIAGNOSIS — I129 Hypertensive chronic kidney disease with stage 1 through stage 4 chronic kidney disease, or unspecified chronic kidney disease: Secondary | ICD-10-CM | POA: Diagnosis present

## 2019-02-20 DIAGNOSIS — Y909 Presence of alcohol in blood, level not specified: Secondary | ICD-10-CM | POA: Diagnosis present

## 2019-02-20 DIAGNOSIS — Z79899 Other long term (current) drug therapy: Secondary | ICD-10-CM

## 2019-02-20 DIAGNOSIS — R1110 Vomiting, unspecified: Secondary | ICD-10-CM | POA: Diagnosis present

## 2019-02-20 DIAGNOSIS — F10939 Alcohol use, unspecified with withdrawal, unspecified: Secondary | ICD-10-CM | POA: Diagnosis present

## 2019-02-20 DIAGNOSIS — R0682 Tachypnea, not elsewhere classified: Secondary | ICD-10-CM

## 2019-02-20 DIAGNOSIS — Z5320 Procedure and treatment not carried out because of patient's decision for unspecified reasons: Secondary | ICD-10-CM | POA: Diagnosis present

## 2019-02-20 DIAGNOSIS — M109 Gout, unspecified: Secondary | ICD-10-CM | POA: Diagnosis present

## 2019-02-20 DIAGNOSIS — Z7982 Long term (current) use of aspirin: Secondary | ICD-10-CM

## 2019-02-20 DIAGNOSIS — T17908A Unspecified foreign body in respiratory tract, part unspecified causing other injury, initial encounter: Secondary | ICD-10-CM

## 2019-02-20 DIAGNOSIS — Z833 Family history of diabetes mellitus: Secondary | ICD-10-CM

## 2019-02-20 DIAGNOSIS — E8729 Other acidosis: Secondary | ICD-10-CM | POA: Diagnosis present

## 2019-02-20 DIAGNOSIS — E872 Acidosis: Principal | ICD-10-CM | POA: Diagnosis present

## 2019-02-20 DIAGNOSIS — F10229 Alcohol dependence with intoxication, unspecified: Secondary | ICD-10-CM | POA: Diagnosis present

## 2019-02-20 DIAGNOSIS — E785 Hyperlipidemia, unspecified: Secondary | ICD-10-CM | POA: Diagnosis present

## 2019-02-20 DIAGNOSIS — R748 Abnormal levels of other serum enzymes: Secondary | ICD-10-CM | POA: Diagnosis present

## 2019-02-20 DIAGNOSIS — E7800 Pure hypercholesterolemia, unspecified: Secondary | ICD-10-CM | POA: Diagnosis present

## 2019-02-20 DIAGNOSIS — Z9081 Acquired absence of spleen: Secondary | ICD-10-CM

## 2019-02-20 DIAGNOSIS — N400 Enlarged prostate without lower urinary tract symptoms: Secondary | ICD-10-CM | POA: Diagnosis present

## 2019-02-20 DIAGNOSIS — E876 Hypokalemia: Secondary | ICD-10-CM | POA: Diagnosis not present

## 2019-02-20 DIAGNOSIS — F10239 Alcohol dependence with withdrawal, unspecified: Secondary | ICD-10-CM | POA: Diagnosis present

## 2019-02-20 DIAGNOSIS — E860 Dehydration: Secondary | ICD-10-CM | POA: Diagnosis present

## 2019-02-20 DIAGNOSIS — I2510 Atherosclerotic heart disease of native coronary artery without angina pectoris: Secondary | ICD-10-CM | POA: Diagnosis present

## 2019-02-20 LAB — COMPREHENSIVE METABOLIC PANEL
ALT: 25 U/L (ref 0–44)
AST: 40 U/L (ref 15–41)
Albumin: 4.7 g/dL (ref 3.5–5.0)
Alkaline Phosphatase: 142 U/L — ABNORMAL HIGH (ref 38–126)
Anion gap: 31 — ABNORMAL HIGH (ref 5–15)
BUN: 30 mg/dL — ABNORMAL HIGH (ref 8–23)
CO2: 10 mmol/L — ABNORMAL LOW (ref 22–32)
Calcium: 8.9 mg/dL (ref 8.9–10.3)
Chloride: 90 mmol/L — ABNORMAL LOW (ref 98–111)
Creatinine, Ser: 1.91 mg/dL — ABNORMAL HIGH (ref 0.61–1.24)
GFR calc Af Amer: 41 mL/min — ABNORMAL LOW (ref >60)
GFR calc non Af Amer: 36 mL/min — ABNORMAL LOW (ref >60)
Glucose, Bld: 205 mg/dL — ABNORMAL HIGH (ref 70–99)
Potassium: 3.9 mmol/L (ref 3.5–5.1)
Sodium: 131 mmol/L — ABNORMAL LOW (ref 135–145)
Total Bilirubin: 1.4 mg/dL — ABNORMAL HIGH (ref 0.3–1.2)
Total Protein: 8.2 g/dL — ABNORMAL HIGH (ref 6.5–8.1)

## 2019-02-20 LAB — URINALYSIS, COMPLETE (UACMP) WITH MICROSCOPIC
Bacteria, UA: NONE SEEN
Bilirubin Urine: NEGATIVE
Glucose, UA: 150 mg/dL — AB
Ketones, ur: 20 mg/dL — AB
Leukocytes,Ua: NEGATIVE
Nitrite: NEGATIVE
Protein, ur: 100 mg/dL — AB
Specific Gravity, Urine: 1.014 (ref 1.005–1.030)
Squamous Epithelial / HPF: NONE SEEN (ref 0–5)
pH: 5 (ref 5.0–8.0)

## 2019-02-20 LAB — CBC WITH DIFFERENTIAL/PLATELET
Abs Immature Granulocytes: 0.39 10*3/uL — ABNORMAL HIGH (ref 0.00–0.07)
Basophils Absolute: 0.1 10*3/uL (ref 0.0–0.1)
Basophils Relative: 0 %
Eosinophils Absolute: 0.1 10*3/uL (ref 0.0–0.5)
Eosinophils Relative: 0 %
HCT: 42.3 % (ref 39.0–52.0)
Hemoglobin: 14.2 g/dL (ref 13.0–17.0)
Immature Granulocytes: 2 %
Lymphocytes Relative: 6 %
Lymphs Abs: 1 10*3/uL (ref 0.7–4.0)
MCH: 33.4 pg (ref 26.0–34.0)
MCHC: 33.6 g/dL (ref 30.0–36.0)
MCV: 99.5 fL (ref 80.0–100.0)
Monocytes Absolute: 1.2 10*3/uL — ABNORMAL HIGH (ref 0.1–1.0)
Monocytes Relative: 7 %
Neutro Abs: 14.6 10*3/uL — ABNORMAL HIGH (ref 1.7–7.7)
Neutrophils Relative %: 85 %
Platelets: 347 10*3/uL (ref 150–400)
RBC: 4.25 MIL/uL (ref 4.22–5.81)
RDW: 13.9 % (ref 11.5–15.5)
WBC: 17.3 10*3/uL — ABNORMAL HIGH (ref 4.0–10.5)
nRBC: 0.2 % (ref 0.0–0.2)

## 2019-02-20 LAB — ETHANOL: Alcohol, Ethyl (B): 78 mg/dL — ABNORMAL HIGH (ref <10)

## 2019-02-20 LAB — URINE DRUG SCREEN, QUALITATIVE (ARMC ONLY)
Amphetamines, Ur Screen: NOT DETECTED
Barbiturates, Ur Screen: NOT DETECTED
Benzodiazepine, Ur Scrn: NOT DETECTED
Cannabinoid 50 Ng, Ur ~~LOC~~: NOT DETECTED
Cocaine Metabolite,Ur ~~LOC~~: NOT DETECTED
MDMA (Ecstasy)Ur Screen: NOT DETECTED
Methadone Scn, Ur: NOT DETECTED
Opiate, Ur Screen: NOT DETECTED
Phencyclidine (PCP) Ur S: NOT DETECTED
Tricyclic, Ur Screen: NOT DETECTED

## 2019-02-20 LAB — TROPONIN I: Troponin I: <0.03 ng/mL (ref <0.03)

## 2019-02-20 LAB — CK: Total CK: 480 U/L — ABNORMAL HIGH (ref 49–397)

## 2019-02-20 MED ORDER — SODIUM CHLORIDE 0.9 % IV BOLUS
1000.0000 mL | Freq: Once | INTRAVENOUS | Status: AC
  Administered 2019-02-20: 1000 mL via INTRAVENOUS

## 2019-02-20 MED ORDER — THIAMINE HCL 100 MG/ML IJ SOLN
100.0000 mg | Freq: Once | INTRAMUSCULAR | Status: AC
  Administered 2019-02-20: 100 mg via INTRAVENOUS
  Filled 2019-02-20: qty 2

## 2019-02-20 NOTE — ED Notes (Signed)
Chauncy Passy (sister) POA called, contact info is [phone]

## 2019-02-20 NOTE — ED Triage Notes (Signed)
Pt presents via EMS from home where he lives alone. Pt found in muidst of empty liquor bottles and half consumed etoh drinks. Pt found by neighbor and is unable to answer orientation or health hx questions. Pt is altered and unable to answer orientation questions.

## 2019-02-20 NOTE — ED Provider Notes (Signed)
[facility] Emergency Department Provider Note  ____________________________________________  Time seen: Approximately 8:41 PM  I have reviewed the triage vital signs and the nursing notes.   HISTORY  Chief Complaint Alcohol Intoxication  Level 5 caveat:  Portions of the history and physical were unable to be obtained due to AMS   HPI Edward Singleton is a 68 y.o. male with a history of alcohol abuse, dementia, diabetes, hypertension, hyperlipidemia who presents for evaluation of altered mental status.  Patient was found on the floor of his apartment surrounded by several bottles of liquor.  Patient is very confused and oriented to self only.  He denies headache, neck pain, back pain, chest pain or abdominal pain.  Patient is unable to provide any further history.  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   Diabetes mellitus without complication ([place])    Gout    Gout    Hypercholesteremia    Hypertension    Kidney stones    Left flank pain, chronic    Pelvic fracture ([place]) 11/17/2014    Patient Active Problem List   Diagnosis Date Noted   Left rotator cuff tear arthropathy 12/28/2015   Hyperlipidemia 12/24/2015   Tobacco abuse 11/06/2015   Mood disturbance 11/06/2015   CAD (coronary artery disease) 10/16/2015   PAD (peripheral artery disease) ([place]) 10/16/2015   Preventative health care 08/13/2015   Renal artery stenosis ([place]) 07/30/2015   Kidney atrophy 07/30/2015   CKD (chronic kidney disease), stage III ([place]) 07/30/2015   Insomnia 07/22/2015   History of substance abuse ([place]) 07/15/2015   BPH (benign prostatic hyperplasia) 07/15/2015   Gout 07/15/2015   Kidney stones 05/11/2015   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 Authorizing Provider  allopurinol (ZYLOPRIM) 300 MG tablet Take 0.5 tablets (150 mg total) by mouth daily. 12/28/15   Coral Spikes, DO  amLODipine (NORVASC) 5 MG tablet Take 1 tablet (5 mg total) by mouth daily. 04/11/16   Wellington Hampshire, MD  aspirin 81 MG tablet Take 81 mg by mouth daily.    [provider]  escitalopram (LEXAPRO) 20 MG tablet Take 0.5 tablets (10 mg total) by mouth daily. 12/28/15   Coral Spikes, DO  folic acid (FOLVITE) 1 MG tablet Take 1 tablet (1 mg total) by mouth daily. 08/13/15   Coral Spikes, DO  metFORMIN (GLUCOPHAGE) 500 MG tablet Take 500 mg by mouth 2 (two) times daily with a meal. Lunch and Higher education careers adviser, Historical, MD  metoprolol succinate (TOPROL-XL) 25 MG 24 hr tablet Take 1 tablet (25 mg total) by mouth daily. 12/28/15   Coral Spikes, DO  rosuvastatin (CRESTOR) 20 MG tablet Take one tab daily Patient taking differently: Take 20 mg by mouth daily. Take one tab daily 12/11/15   Coral Spikes,  DO  thiamine (VITAMIN B-1) 100 MG tablet Take 100 mg by mouth daily.    [provider]  traZODone (DESYREL) 50 MG tablet Take 1 tablet (50 mg total) by mouth at bedtime. 12/28/15   Coral Spikes, DO    Allergies Patient has no known allergies.  Family History  Problem Relation Age of Onset   Heart disease Mother    Heart disease Father    Diabetes Father    Nephrolithiasis Paternal Grandfather    Kidney disease Neg Hx    Prostate cancer Neg Hx     Social History Social History   Tobacco Use   Smoking status: Current Every Day Smoker     Packs/day: 1.00    Years: 50.00    Pack years: 50.00    Types: Cigarettes   Smokeless tobacco: Current User    Types: Chew  Substance Use Topics   Alcohol use: Yes    Alcohol/week: 42.0 standard drinks    Types: 42 Cans of beer per week   Drug use: No    Review of Systems  Constitutional: Negative for fever. + AMS Neck: No neck pain  Cardiovascular: Negative for chest pain. Respiratory: Negative for shortness of breath. Gastrointestinal: Negative for abdominal pain Musculoskeletal: Negative for back pain.  ____________________________________________   PHYSICAL EXAM:  VITAL SIGNS: ED Triage Vitals  Enc Vitals Group     BP 02/20/19 1946 135/78     Pulse Rate 02/20/19 1946 (!) 111     Resp 02/20/19 1946 (!) 28     Temp 02/20/19 1946 97.9 F (36.6 C)     Temp src --      SpO2 02/20/19 1946 98 %     Weight 02/20/19 1955 170 lb (77.1 kg)     Height 02/20/19 1955 5\' 9"  (1.753 m)     Head Circumference --      Peak Flow --      Pain Score 02/20/19 1952 0     Pain Loc --      Pain Edu? --      Excl. in [place]? --     Constitutional: Alert, confused, oriented to self only.  HEENT:      Head: Normocephalic and atraumatic.         Eyes: Conjunctivae are normal. Sclera is non-icteric. PERRL      Mouth/Throat: Mucous membranes are dry.       Neck: Supple with no signs of meningismus. No c spine tenderness Cardiovascular: Tachycardic with regular rhythm. No murmurs, gallops, or rubs. 2+ symmetrical distal pulses are present in all extremities. No JVD. Respiratory: Tachypneic, normal respiratory effort. Lungs are clear to auscultation bilaterally. No wheezes, crackles, or rhonchi.  Gastrointestinal: Soft, non tender, and non distended with positive bowel sounds. No rebound or guarding. Musculoskeletal: Nontender with normal range of motion in all extremities. No T and L spine tenderness. No edema, cyanosis, or erythema of extremities. Neurologic: Confused, normal speech. Face  is symmetric. Moving all extremities.  Skin: Skin is warm, dry and intact. No rash noted.   ____________________________________________   LABS (all labs ordered are listed, but only abnormal results are displayed)  Labs Reviewed  CBC WITH DIFFERENTIAL/PLATELET - Abnormal; Notable for the following components:      Result Value   WBC 17.3 (*)    Neutro Abs 14.6 (*)    Monocytes Absolute 1.2 (*)    Abs Immature Granulocytes 0.39 (*)    All other components within normal limits  COMPREHENSIVE METABOLIC PANEL - Abnormal; Notable for the following components:   Sodium 131 (*)    Chloride 90 (*)    CO2 10 (*)    Glucose, Bld 205 (*)    BUN 30 (*)    Creatinine, Ser 1.91 (*)    Total Protein 8.2 (*)    Alkaline Phosphatase 142 (*)    Total Bilirubin 1.4 (*)    GFR calc non Af Amer 36 (*)    GFR calc Af Amer 41 (*)    Anion gap 31 (*)    All other components within normal limits  CK - Abnormal; Notable for the following components:   Total CK 480 (*)    All other components within normal limits  ETHANOL - Abnormal; Notable for the following components:   Alcohol, Ethyl (B) 78 (*)    All other components within normal limits  URINALYSIS, COMPLETE (UACMP) WITH MICROSCOPIC - Abnormal; Notable for the following components:   Color, Urine YELLOW (*)    APPearance HAZY (*)    Glucose, UA 150 (*)    Hgb urine dipstick LARGE (*)    Ketones, ur 20 (*)    Protein, ur 100 (*)    All other components within normal limits  TROPONIN I  URINE DRUG SCREEN, QUALITATIVE ([facility] ONLY)  VOLATILES,BLD-ACETONE,ETHANOL,ISOPROP,METHANOL  BASIC METABOLIC PANEL   ____________________________________________  EKG  ED ECG REPORT I, Rudene Re, the attending physician, personally viewed and interpreted this ECG.  Sinus tachycardia, rate of 100, normal intervals, normal axis, no ST elevations or depressions.  No significant changes when compared to  prior. ____________________________________________  RADIOLOGY  I have personally reviewed the images performed during this visit and I agree with the Radiologist's read.   Interpretation by Radiologist:  Ct Head Wo Contrast  Result Date: 02/20/2019 CLINICAL DATA:  Encephalopathy EXAM: CT HEAD WITHOUT CONTRAST TECHNIQUE: Contiguous axial images were obtained from the base of the skull through the vertex without intravenous contrast. COMPARISON:  CT head dated 03/24/1999 FINDINGS: Brain: There is extensive encephalomalacia involving the right temporoparietal region. There is a hypodensity in the left parietal lobe, likely representing an old infarct. This is stable from prior CT. The right temporoparietal encephalomalacia is new from prior CT. Age related volume loss is noted. There is no midline shift. There is no intracranial hemorrhage. Vascular: No hyperdense vessel or unexpected calcification. Skull: Normal. Negative for fracture or focal lesion. Sinuses/Orbits: No acute finding. Other: None. IMPRESSION: 1. No acute intracranial abnormality. 2. 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 3. Age related volume loss with progression since 2016. Electronically Signed   By: Constance Holster M.D.   On: 02/20/2019 20:45      ____________________________________________   PROCEDURES  Procedure(s) performed: None Procedures Critical Care performed:  None ____________________________________________   INITIAL IMPRESSION / ASSESSMENT AND PLAN / ED COURSE  68 y.o. male with a history of alcohol abuse, dementia, diabetes, hypertension, hyperlipidemia who presents for evaluation of altered mental status after being found down in his apartment surrounded by bottles of liquor.  Patient is oriented to self only.  There is no evidence of trauma on physical exam.  EKG showing sinus tachycardia with no ischemic changes.  Patient looks dry on exam.  Head CT showed no intracranial  bleeding.  Labs showing mild hyponatremia with sodium of 131, stable creatinine, anion gap of 31, and an alcohol level of 78. Total CK 480.  Leukocytosis with a white count of 17.3 most  likely from stress response.  Presentation concerning for anion gap metabolic acidosis probably from severe dehydration from alcohol abuse.  Will give IV fluids and IV thiamine.  Will monitor until patient sobers up and can be reassessed.    _________________________ 11:09 PM on 02/20/2019 -----------------------------------------  Patient is now clinically sober, alert and oriented x3.  Refusing to stay in the hospital or any help with detox.  He wants to go back home.  He is okay waiting to see if his gap has closed after fluids.  Repeat BMP is pending.  Care transferred to Dr. Owens Shark   As part of my medical decision making, I reviewed the following data within the [place] notes reviewed and incorporated, Labs reviewed , EKG interpreted , Old EKG reviewed, Old chart reviewed, Radiograph reviewed , Notes from prior ED visits and [place] Controlled Substance Database    Pertinent labs & imaging results that were available during my care of the patient were reviewed by me and considered in my medical decision making (see chart for details).    ____________________________________________   FINAL CLINICAL IMPRESSION(S) / ED DIAGNOSES  Final diagnoses:  Alcoholic ketoacidosis      NEW MEDICATIONS STARTED DURING THIS VISIT:  ED Discharge Orders    None       Note:  This document was prepared using Dragon voice recognition software and may include unintentional dictation errors.    Alfred Levins, [place], MD 02/20/19 [id number]

## 2019-02-21 ENCOUNTER — Inpatient Hospital Stay: Payer: No Typology Code available for payment source

## 2019-02-21 DIAGNOSIS — Z7982 Long term (current) use of aspirin: Secondary | ICD-10-CM | POA: Diagnosis not present

## 2019-02-21 DIAGNOSIS — F10239 Alcohol dependence with withdrawal, unspecified: Secondary | ICD-10-CM | POA: Diagnosis present

## 2019-02-21 DIAGNOSIS — E872 Acidosis: Secondary | ICD-10-CM | POA: Diagnosis present

## 2019-02-21 DIAGNOSIS — R400 Somnolence: Secondary | ICD-10-CM | POA: Diagnosis not present

## 2019-02-21 DIAGNOSIS — R748 Abnormal levels of other serum enzymes: Secondary | ICD-10-CM | POA: Diagnosis present

## 2019-02-21 DIAGNOSIS — E8729 Other acidosis: Secondary | ICD-10-CM | POA: Diagnosis present

## 2019-02-21 DIAGNOSIS — Z5320 Procedure and treatment not carried out because of patient's decision for unspecified reasons: Secondary | ICD-10-CM | POA: Diagnosis present

## 2019-02-21 DIAGNOSIS — M109 Gout, unspecified: Secondary | ICD-10-CM | POA: Diagnosis present

## 2019-02-21 DIAGNOSIS — F17220 Nicotine dependence, chewing tobacco, uncomplicated: Secondary | ICD-10-CM | POA: Diagnosis present

## 2019-02-21 DIAGNOSIS — Z8673 Personal history of transient ischemic attack (TIA), and cerebral infarction without residual deficits: Secondary | ICD-10-CM | POA: Diagnosis not present

## 2019-02-21 DIAGNOSIS — I129 Hypertensive chronic kidney disease with stage 1 through stage 4 chronic kidney disease, or unspecified chronic kidney disease: Secondary | ICD-10-CM | POA: Diagnosis present

## 2019-02-21 DIAGNOSIS — E860 Dehydration: Secondary | ICD-10-CM | POA: Diagnosis present

## 2019-02-21 DIAGNOSIS — F10939 Alcohol use, unspecified with withdrawal, unspecified: Secondary | ICD-10-CM | POA: Diagnosis present

## 2019-02-21 DIAGNOSIS — I2510 Atherosclerotic heart disease of native coronary artery without angina pectoris: Secondary | ICD-10-CM | POA: Diagnosis present

## 2019-02-21 DIAGNOSIS — E1122 Type 2 diabetes mellitus with diabetic chronic kidney disease: Secondary | ICD-10-CM | POA: Diagnosis present

## 2019-02-21 DIAGNOSIS — E876 Hypokalemia: Secondary | ICD-10-CM | POA: Diagnosis not present

## 2019-02-21 DIAGNOSIS — E785 Hyperlipidemia, unspecified: Secondary | ICD-10-CM | POA: Diagnosis present

## 2019-02-21 DIAGNOSIS — E119 Type 2 diabetes mellitus without complications: Secondary | ICD-10-CM

## 2019-02-21 DIAGNOSIS — Z1159 Encounter for screening for other viral diseases: Secondary | ICD-10-CM | POA: Diagnosis not present

## 2019-02-21 DIAGNOSIS — N400 Enlarged prostate without lower urinary tract symptoms: Secondary | ICD-10-CM | POA: Diagnosis present

## 2019-02-21 DIAGNOSIS — E871 Hypo-osmolality and hyponatremia: Secondary | ICD-10-CM | POA: Diagnosis present

## 2019-02-21 DIAGNOSIS — F1027 Alcohol dependence with alcohol-induced persisting dementia: Secondary | ICD-10-CM | POA: Diagnosis present

## 2019-02-21 DIAGNOSIS — N179 Acute kidney failure, unspecified: Secondary | ICD-10-CM | POA: Diagnosis present

## 2019-02-21 DIAGNOSIS — E7800 Pure hypercholesterolemia, unspecified: Secondary | ICD-10-CM | POA: Diagnosis present

## 2019-02-21 DIAGNOSIS — F17210 Nicotine dependence, cigarettes, uncomplicated: Secondary | ICD-10-CM | POA: Diagnosis present

## 2019-02-21 DIAGNOSIS — R1110 Vomiting, unspecified: Secondary | ICD-10-CM | POA: Diagnosis present

## 2019-02-21 DIAGNOSIS — Y909 Presence of alcohol in blood, level not specified: Secondary | ICD-10-CM | POA: Diagnosis present

## 2019-02-21 DIAGNOSIS — N182 Chronic kidney disease, stage 2 (mild): Secondary | ICD-10-CM | POA: Diagnosis present

## 2019-02-21 DIAGNOSIS — F10229 Alcohol dependence with intoxication, unspecified: Secondary | ICD-10-CM | POA: Diagnosis present

## 2019-02-21 LAB — BASIC METABOLIC PANEL
Anion gap: 20 — ABNORMAL HIGH (ref 5–15)
Anion gap: 20 — ABNORMAL HIGH (ref 5–15)
BUN: 27 mg/dL — ABNORMAL HIGH (ref 8–23)
BUN: 27 mg/dL — ABNORMAL HIGH (ref 8–23)
CO2: 16 mmol/L — ABNORMAL LOW (ref 22–32)
CO2: 17 mmol/L — ABNORMAL LOW (ref 22–32)
Calcium: 8.2 mg/dL — ABNORMAL LOW (ref 8.9–10.3)
Calcium: 8 mg/dL — ABNORMAL LOW (ref 8.9–10.3)
Chloride: 96 mmol/L — ABNORMAL LOW (ref 98–111)
Chloride: 97 mmol/L — ABNORMAL LOW (ref 98–111)
Creatinine, Ser: 1.61 mg/dL — ABNORMAL HIGH (ref 0.61–1.24)
Creatinine, Ser: 1.52 mg/dL — ABNORMAL HIGH (ref 0.61–1.24)
GFR calc Af Amer: 51 mL/min — ABNORMAL LOW (ref >60)
GFR calc Af Amer: 55 mL/min — ABNORMAL LOW (ref >60)
GFR calc non Af Amer: 44 mL/min — ABNORMAL LOW (ref >60)
GFR calc non Af Amer: 47 mL/min — ABNORMAL LOW (ref >60)
Glucose, Bld: 160 mg/dL — ABNORMAL HIGH (ref 70–99)
Glucose, Bld: 167 mg/dL — ABNORMAL HIGH (ref 70–99)
Potassium: 3.5 mmol/L (ref 3.5–5.1)
Potassium: 3.4 mmol/L — ABNORMAL LOW (ref 3.5–5.1)
Sodium: 132 mmol/L — ABNORMAL LOW (ref 135–145)
Sodium: 134 mmol/L — ABNORMAL LOW (ref 135–145)

## 2019-02-21 LAB — GLUCOSE, CAPILLARY
Glucose-Capillary: 169 mg/dL — ABNORMAL HIGH (ref 70–99)
Glucose-Capillary: 174 mg/dL — ABNORMAL HIGH (ref 70–99)
Glucose-Capillary: 138 mg/dL — ABNORMAL HIGH (ref 70–99)
Glucose-Capillary: 141 mg/dL — ABNORMAL HIGH (ref 70–99)
Glucose-Capillary: 124 mg/dL — ABNORMAL HIGH (ref 70–99)

## 2019-02-21 LAB — SARS CORONAVIRUS 2 BY RT PCR (HOSPITAL ORDER, PERFORMED IN ~~LOC~~ HOSPITAL LAB): SARS Coronavirus 2: NEGATIVE

## 2019-02-21 LAB — CBC
HCT: 36.6 % — ABNORMAL LOW (ref 39.0–52.0)
Hemoglobin: 12.7 g/dL — ABNORMAL LOW (ref 13.0–17.0)
MCH: 33.6 pg (ref 26.0–34.0)
MCHC: 34.7 g/dL (ref 30.0–36.0)
MCV: 96.8 fL (ref 80.0–100.0)
Platelets: 337 10*3/uL (ref 150–400)
RBC: 3.78 MIL/uL — ABNORMAL LOW (ref 4.22–5.81)
RDW: 13.9 % (ref 11.5–15.5)
WBC: 19.6 10*3/uL — ABNORMAL HIGH (ref 4.0–10.5)
nRBC: 0.2 % (ref 0.0–0.2)

## 2019-02-21 LAB — CK: Total CK: 1563 U/L — ABNORMAL HIGH (ref 49–397)

## 2019-02-21 LAB — MAGNESIUM: Magnesium: 1.9 mg/dL (ref 1.7–2.4)

## 2019-02-21 LAB — MRSA PCR SCREENING: MRSA by PCR: NEGATIVE

## 2019-02-21 MED ORDER — ADULT MULTIVITAMIN W/MINERALS CH
1.0000 | ORAL_TABLET | Freq: Every day | ORAL | Status: DC
  Administered 2019-02-21 – 2019-02-23 (×3): 1 via ORAL
  Filled 2019-02-21 (×3): qty 1

## 2019-02-21 MED ORDER — THIAMINE HCL 100 MG/ML IJ SOLN
Freq: Once | INTRAVENOUS | Status: AC
  Administered 2019-02-21: 03:00:00 via INTRAVENOUS
  Filled 2019-02-21: qty 1000

## 2019-02-21 MED ORDER — ONDANSETRON HCL 4 MG PO TABS: 4.0000 mg | ORAL_TABLET | Freq: Four times a day (QID) | ORAL | Status: DC | PRN

## 2019-02-21 MED ORDER — METOPROLOL SUCCINATE ER 50 MG PO TB24: 25.0000 mg | ORAL_TABLET | Freq: Every day | ORAL | Status: DC

## 2019-02-21 MED ORDER — CHLORHEXIDINE GLUCONATE CLOTH 2 % EX PADS
6.0000 | MEDICATED_PAD | Freq: Every day | CUTANEOUS | Status: DC
  Administered 2019-02-21 – 2019-02-23 (×3): 6 via TOPICAL

## 2019-02-21 MED ORDER — SODIUM CHLORIDE 0.9 % IV SOLN
INTRAVENOUS | Status: DC
  Administered 2019-02-21: 03:00:00 via INTRAVENOUS

## 2019-02-21 MED ORDER — POTASSIUM CHLORIDE 2 MEQ/ML IV SOLN
INTRAVENOUS | Status: AC
  Administered 2019-02-21 (×2): via INTRAVENOUS
  Filled 2019-02-21 (×2): qty 1000

## 2019-02-21 MED ORDER — VITAMIN B-1 100 MG PO TABS: 100.0000 mg | ORAL_TABLET | Freq: Every day | ORAL | Status: DC

## 2019-02-21 MED ORDER — ESCITALOPRAM OXALATE 10 MG PO TABS
10.0000 mg | ORAL_TABLET | Freq: Every day | ORAL | Status: DC
  Filled 2019-02-21: qty 1

## 2019-02-21 MED ORDER — LORAZEPAM 2 MG/ML IJ SOLN
0.0000 mg | Freq: Four times a day (QID) | INTRAMUSCULAR | Status: DC
  Administered 2019-02-21 (×3): 2 mg via INTRAVENOUS
  Filled 2019-02-21 (×3): qty 1

## 2019-02-21 MED ORDER — SODIUM CHLORIDE 0.9 % IV SOLN
3.0000 g | Freq: Four times a day (QID) | INTRAVENOUS | Status: DC
  Administered 2019-02-21: 3 g via INTRAVENOUS
  Filled 2019-02-21 (×4): qty 3

## 2019-02-21 MED ORDER — ACETAMINOPHEN 650 MG RE SUPP: 650.0000 mg | Freq: Four times a day (QID) | RECTAL | Status: DC | PRN

## 2019-02-21 MED ORDER — TRAZODONE HCL 50 MG PO TABS: 50.0000 mg | ORAL_TABLET | Freq: Every day | ORAL | Status: DC

## 2019-02-21 MED ORDER — HEPARIN SODIUM (PORCINE) 5000 UNIT/ML IJ SOLN
5000.0000 [IU] | Freq: Three times a day (TID) | INTRAMUSCULAR | Status: DC
  Administered 2019-02-21: 5000 [IU] via SUBCUTANEOUS
  Filled 2019-02-21: qty 1

## 2019-02-21 MED ORDER — THIAMINE HCL 100 MG/ML IJ SOLN
500.0000 mg | Freq: Every day | INTRAMUSCULAR | Status: AC
  Administered 2019-02-21 – 2019-02-23 (×3): 500 mg via INTRAVENOUS
  Filled 2019-02-21 (×3): qty 6

## 2019-02-21 MED ORDER — INSULIN ASPART 100 UNIT/ML ~~LOC~~ SOLN: 0.0000 [IU] | Freq: Every day | SUBCUTANEOUS | Status: DC

## 2019-02-21 MED ORDER — ASPIRIN EC 81 MG PO TBEC
81.0000 mg | DELAYED_RELEASE_TABLET | Freq: Every day | ORAL | Status: DC
  Administered 2019-02-21 – 2019-02-23 (×3): 81 mg via ORAL
  Filled 2019-02-21 (×3): qty 1

## 2019-02-21 MED ORDER — THIAMINE HCL 100 MG/ML IJ SOLN: 100.0000 mg | Freq: Every day | INTRAMUSCULAR | Status: DC

## 2019-02-21 MED ORDER — LORAZEPAM 2 MG PO TABS: 0.0000 mg | ORAL_TABLET | Freq: Two times a day (BID) | ORAL | Status: DC

## 2019-02-21 MED ORDER — LORAZEPAM 2 MG/ML IJ SOLN: 2.0000 mg | INTRAMUSCULAR | Status: DC | PRN

## 2019-02-21 MED ORDER — LORAZEPAM 2 MG/ML IJ SOLN: 1.0000 mg | Freq: Four times a day (QID) | INTRAMUSCULAR | Status: DC | PRN

## 2019-02-21 MED ORDER — DEXMEDETOMIDINE HCL IN NACL 400 MCG/100ML IV SOLN
0.4000 ug/kg/h | INTRAVENOUS | Status: DC
  Administered 2019-02-21 (×2): 0.4 ug/kg/h via INTRAVENOUS
  Filled 2019-02-21: qty 100

## 2019-02-21 MED ORDER — ALBUTEROL SULFATE (2.5 MG/3ML) 0.083% IN NEBU: 2.5000 mg | INHALATION_SOLUTION | RESPIRATORY_TRACT | Status: DC | PRN

## 2019-02-21 MED ORDER — FOLIC ACID 1 MG PO TABS: 1.0000 mg | ORAL_TABLET | Freq: Every day | ORAL | Status: DC

## 2019-02-21 MED ORDER — LORAZEPAM 2 MG/ML IJ SOLN: 0.0000 mg | Freq: Four times a day (QID) | INTRAMUSCULAR | Status: DC

## 2019-02-21 MED ORDER — LORAZEPAM 2 MG/ML IJ SOLN: 0.0000 mg | Freq: Two times a day (BID) | INTRAMUSCULAR | Status: DC

## 2019-02-21 MED ORDER — ROSUVASTATIN CALCIUM 10 MG PO TABS
20.0000 mg | ORAL_TABLET | Freq: Every day | ORAL | Status: DC
  Administered 2019-02-21 – 2019-02-23 (×3): 20 mg via ORAL
  Filled 2019-02-21 (×3): qty 2

## 2019-02-21 MED ORDER — DEXMEDETOMIDINE HCL IN NACL 400 MCG/100ML IV SOLN
0.4000 ug/kg/h | INTRAVENOUS | Status: DC
  Administered 2019-02-21 – 2019-02-22 (×2): 0.4 ug/kg/h via INTRAVENOUS
  Filled 2019-02-21 (×2): qty 100

## 2019-02-21 MED ORDER — LORAZEPAM 2 MG PO TABS: 0.0000 mg | ORAL_TABLET | Freq: Four times a day (QID) | ORAL | Status: DC

## 2019-02-21 MED ORDER — PROMETHAZINE HCL 25 MG/ML IJ SOLN: 12.5000 mg | Freq: Four times a day (QID) | INTRAMUSCULAR | Status: DC | PRN

## 2019-02-21 MED ORDER — AMLODIPINE BESYLATE 5 MG PO TABS: 5.0000 mg | ORAL_TABLET | Freq: Every day | ORAL | Status: DC

## 2019-02-21 MED ORDER — LEVALBUTEROL HCL 1.25 MG/0.5ML IN NEBU: 1.2500 mg | INHALATION_SOLUTION | Freq: Four times a day (QID) | RESPIRATORY_TRACT | Status: DC | PRN

## 2019-02-21 MED ORDER — ONDANSETRON HCL 4 MG/2ML IJ SOLN
4.0000 mg | Freq: Four times a day (QID) | INTRAMUSCULAR | Status: DC | PRN
  Administered 2019-02-21 (×2): 4 mg via INTRAVENOUS
  Filled 2019-02-21 (×2): qty 2

## 2019-02-21 MED ORDER — INSULIN ASPART 100 UNIT/ML ~~LOC~~ SOLN
0.0000 [IU] | Freq: Three times a day (TID) | SUBCUTANEOUS | Status: DC
  Administered 2019-02-21 (×2): 1 [IU] via SUBCUTANEOUS
  Administered 2019-02-21 – 2019-02-22 (×2): 2 [IU] via SUBCUTANEOUS
  Administered 2019-02-22 – 2019-02-23 (×2): 1 [IU] via SUBCUTANEOUS
  Filled 2019-02-21 (×6): qty 1

## 2019-02-21 MED ORDER — ACETAMINOPHEN 325 MG PO TABS
650.0000 mg | ORAL_TABLET | Freq: Four times a day (QID) | ORAL | Status: DC | PRN
  Administered 2019-02-23 (×2): 650 mg via ORAL
  Filled 2019-02-21 (×2): qty 2

## 2019-02-21 MED ORDER — ENOXAPARIN SODIUM 40 MG/0.4ML ~~LOC~~ SOLN
40.0000 mg | SUBCUTANEOUS | Status: DC
  Administered 2019-02-21 – 2019-02-23 (×3): 40 mg via SUBCUTANEOUS
  Filled 2019-02-21 (×3): qty 0.4

## 2019-02-21 MED ORDER — LORAZEPAM 2 MG/ML IJ SOLN
1.0000 mg | INTRAMUSCULAR | Status: DC | PRN
  Administered 2019-02-21 – 2019-02-23 (×3): 1 mg via INTRAVENOUS
  Filled 2019-02-21 (×3): qty 1

## 2019-02-21 MED ORDER — LORAZEPAM 1 MG PO TABS: 1.0000 mg | ORAL_TABLET | Freq: Four times a day (QID) | ORAL | Status: DC | PRN

## 2019-02-21 NOTE — ED Notes (Signed)
RN spoke with POA, who is concerned that patient's alcohol level was not correct, and she believes he is not safe to return home. POA informed that RN will speak with physician and relay her concern

## 2019-02-21 NOTE — ED Notes (Signed)
This RN spoke with patient's sister, his POA, and notified her of patient's admission to ICU. Explained to sister that he is being admitted for severe dehydration due to alcohol consumption and for withdrawal symptoms. Informed sister that patient is sick but stable at present, gave sister number to CCU

## 2019-02-21 NOTE — Consult Note (Signed)
Name: Edward Singleton MRN: [id number] DOB: 1951-05-09    ADMISSION DATE:  02/20/2019 CONSULTATION DATE:  02/21/2019  REFERRING MD :  Dr. Marcille Blanco  CHIEF COMPLAINT:   Alcohol withdrawal  BRIEF PATIENT DESCRIPTION:  68 y.o Male with PMH of ETOH abuse, BPH, Dementia, DM, gout, HTN admitted with Alcoholic Ketoacidosis and Alcohol withdrawal requiring Precedex drip.  SIGNIFICANT EVENTS  02/21/19>>Admission to [facility] Stepdown  STUDIES:  CT Head 5/13>> 1. No acute intracranial abnormality. 2. 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 3. Age related volume loss with progression since 2016 CXR 5/14>> Mild bronchial and interstitial coarsening without focal airspace disease.  CULTURES: COVID-19 5/14>>NEGATIVE  ANTIBIOTICS: N/A  HISTORY OF PRESENT ILLNESS:   Edward Singleton is a 68 year old male with a past medical history notable for alcohol abuse, BPH, dementia, diabetes, gout, hypercholesterolemia, and hypertension who presented to [facility] ED on 02/20/2019 due to altered mental status.  He was found on the floor confused and oriented only to self at his apartment surrounded by several bottles of liquor.  Upon presentation to the ED there was no evidence of trauma and he appeared dehydrated.  Initial work-up in the ED revealed sodium 131, bicarb 10, creatinine 1.91, anion gap 31, CK 480, negative troponin, WBC 17.3.  Serum ethyl alcohol 78, urine drug screen negative, urinalysis positive for ketones but negative for UTI.  CT of the head was negative for any acute intracranial abnormality, but did show old large right MCA territory infarct.  He was given IV fluids and IV thiamine in the ED, and was to be admitted to [facility] unit for Alcoholic Ketoacidosis.  However prior to admission, patient becoming more confused, agitated, tachycardic, and tremulous concerning for alcohol withdrawal.  He required several doses of IV Ativan.  Given concern over his worsening alcohol withdrawal, he is  being admitted to stepdown unit.  PCCM is consulted for further management.  PAST MEDICAL HISTORY :   has a past medical history of Alcohol abuse, Atrophic kidney, Benign enlargement of prostate, Dementia ([place]), Diabetes mellitus without complication ([place]), Gout, Gout, Hypercholesteremia, Hypertension, Kidney stones, Left flank pain, chronic, and Pelvic fracture ([place]) (11/17/2014).  has a past surgical history that includes ORIF humerus fracture (Left, 11/18/2014); Lithotripsy; left renal stent placement (2013); and Splenectomy. Prior to Admission medications   Medication Sig Start Date End Date Taking? Authorizing Provider  allopurinol (ZYLOPRIM) 300 MG tablet Take 0.5 tablets (150 mg total) by mouth daily. 12/28/15   Coral Spikes, DO  amLODipine (NORVASC) 5 MG tablet Take 1 tablet (5 mg total) by mouth daily. 04/11/16   Wellington Hampshire, MD  aspirin 81 MG tablet Take 81 mg by mouth daily.    [provider]  escitalopram (LEXAPRO) 20 MG tablet Take 0.5 tablets (10 mg total) by mouth daily. 12/28/15   Coral Spikes, DO  folic acid (FOLVITE) 1 MG tablet Take 1 tablet (1 mg total) by mouth daily. 08/13/15   Coral Spikes, DO  metFORMIN (GLUCOPHAGE) 500 MG tablet Take 500 mg by mouth 2 (two) times daily with a meal. Lunch and Higher education careers adviser, Historical, MD  metoprolol succinate (TOPROL-XL) 25 MG 24 hr tablet Take 1 tablet (25 mg total) by mouth daily. 12/28/15   Coral Spikes, DO  rosuvastatin (CRESTOR) 20 MG tablet Take one tab daily Patient taking differently: Take 20 mg by mouth daily. Take one tab daily 12/11/15   Coral Spikes, DO  thiamine (VITAMIN  B-1) 100 MG tablet Take 100 mg by mouth daily.    [provider]  traZODone (DESYREL) 50 MG tablet Take 1 tablet (50 mg total) by mouth at bedtime. 12/28/15   Coral Spikes, DO   No Known Allergies  FAMILY HISTORY:  family history includes Diabetes in his father; Heart disease in his father and mother; Nephrolithiasis in his  paternal grandfather. SOCIAL HISTORY:  reports that he has been smoking cigarettes. He has a 50.00 pack-year smoking history. His smokeless tobacco use includes chew. He reports current alcohol use of about 42.0 standard drinks of alcohol per week. He reports that he does not use drugs.   REVIEW OF SYSTEMS:   Unable to assess due to altered mental status and alcohol withdrawal  SUBJECTIVE:  Unable to assess due to altered mental status and alcohol withdrawal  VITAL SIGNS: Temp:  [97.9 F (36.6 C)] 97.9 F (36.6 C) (05/13 1946) Pulse Rate:  [90-114] 111 (05/14 0245) Resp:  [17-36] 36 (05/14 0245) BP: (120-173)/(77-110) 157/91 (05/14 0245) SpO2:  [84 %-100 %] 94 % (05/14 0245) Weight:  [77.1 kg] 77.1 kg (05/13 1955)  PHYSICAL EXAMINATION: General: Acutely ill-appearing male, laying in bed, on room air, mildly agitated, irritable, and tremulous, in no acute distress Neuro: Awake and confused, oriented only to self, poor attention HEENT: Atraumatic, normocephalic, neck supple, no JVD, pupils PERRLA Cardiovascular: Tachycardia, regular rhythm, S1-S2, no murmurs rubs or gallops, 2+ pulses throughout Lungs: Expiratory wheezing bilaterally, even, tachypnea Abdomen: Soft, nondistended, nontender, no guarding or rebound tenderness, bowel sounds positive x4 Musculoskeletal: No deformities, normal bulk and tone, no edema Skin: Warm and dry, no obvious rashes lesions or ulcerations  Recent Labs  Lab 02/20/19 1957 02/21/19 0056  NA 131* 132*  K 3.9 3.5  CL 90* 96*  CO2 10* 16*  BUN 30* 27*  CREATININE 1.91* 1.61*  GLUCOSE 205* 160*   Recent Labs  Lab 02/20/19 1957  HGB 14.2  HCT 42.3  WBC 17.3*  PLT 347   Ct Head Wo Contrast  Result Date: 02/20/2019 CLINICAL DATA:  Encephalopathy EXAM: CT HEAD WITHOUT CONTRAST TECHNIQUE: Contiguous axial images were obtained from the base of the skull through the vertex without intravenous contrast. COMPARISON:  CT head dated 03/24/1999  FINDINGS: Brain: There is extensive encephalomalacia involving the right temporoparietal region. There is a hypodensity in the left parietal lobe, likely representing an old infarct. This is stable from prior CT. The right temporoparietal encephalomalacia is new from prior CT. Age related volume loss is noted. There is no midline shift. There is no intracranial hemorrhage. Vascular: No hyperdense vessel or unexpected calcification. Skull: Normal. Negative for fracture or focal lesion. Sinuses/Orbits: No acute finding. Other: None. IMPRESSION: 1. No acute intracranial abnormality. 2. 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 3. Age related volume loss with progression since 2016. Electronically Signed   By: Constance Holster M.D.   On: 02/20/2019 20:45    ASSESSMENT / PLAN:  Alcoholic Ketoacidosis Alcohol Withdrawal -Received 2 L normal saline bolus in ED -IV maintenance fluids -Thiamine, Folate, and multivitamin -CIWA Protocol -Precedex as needed -Aspiration precautions -Monitor for seizures  AKI,  likely in setting of dehydration Hyponatremia Mildly elevated CK -Monitor I&O's / urinary output -Follow BMP -Ensure adequate renal perfusion -Avoid nephrotoxic agents as able -Replace electrolytes as indicated -IV fluids -Trend CK  Leukocytosis, ? Secondary to Stress response -Monitor fever curve -Trend WBC's -Urinalysis negative -Obtain chest x-ray to assess for aspiration in  setting of alcohol intoxication and now with withdrawal>> Negative for Aspiration  Hypertension Hyperlipidemia -Cardiac monitoring -Continue home Norvasc, Metoprolol -Continue home Rosuvastatin  Diabetes mellitus -CBGs -SSI -Follow ICU hypo/hyperglycemia protocol    Pt is HIGH RISK FOR INTUBATION  DISPOSITION: Stepdown GOALS OF CARE: Full code VTE PROPHYLAXIS: Heparin SQ  Darel Hong, AGACNP-BC [facility] Pager: [phone] Cell:  [phone]  02/21/2019, 2:47 AM

## 2019-02-21 NOTE — ED Notes (Signed)
RN concerned with patient's agitation and withdrawal symptoms. MD okayed 2mg  Ativan
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
## 2019-02-21 NOTE — Progress Notes (Signed)
Patient admitted this morning to ICU due to EtOH withdrawal was on Precedex drip.  Patient is somnolent.  Patient off Precedex drip.  Plan of care discussed with intensivist and nurse at bedside.  Agree with admitting MD plan.

## 2019-02-21 NOTE — Progress Notes (Signed)
Notified by nursing that patient had episode of vomiting.  Patient was awake but altered (no change in mental status from previous )during episode.  No change in oxygen requirements or respiratory status currently.  Will place patient empirically on Unasyn for possible aspiration.  Follow-up chest x-ray later today.   Darel Hong, AGACNP-BC [facility] Pager: [phone] Cell: [phone]

## 2019-02-21 NOTE — Consult Note (Signed)
Pharmacy Antibiotic Note  Edward Singleton is a 68 y.o. male admitted on 02/20/2019 with Alcohol Withdrawal.  Pharmacy has been consulted for Unasyn dosing for aspiration pneumonia.  Plan: Start Unasyn 3g IV every 6 hours   Height: 5\' 7"  (170.2 cm) Weight: 170 lb 13.7 oz (77.5 kg) IBW/kg (Calculated) : 66.1  Temp (24hrs), Avg:98.2 F (36.8 C), Min:97.4 F (36.3 C), Max:99.4 F (37.4 C)  Recent Labs  Lab 02/20/19 1957 02/21/19 0056  WBC 17.3*  --   CREATININE 1.91* 1.61*    Estimated Creatinine Clearance: 42.2 mL/min (A) (by C-G formula based on SCr of 1.61 mg/dL (H)).    No Known Allergies  Antimicrobials this admission: 5/14 Unasyn >>   Microbiology results: 5/14 SARS Coronavirus 2: negative  5/14 MRSA PCR: pending  Thank you for allowing pharmacy to be a part of this patient's care.  Pernell Dupre, PharmD, BCPS Clinical Pharmacist 02/21/2019 5:23 AM

## 2019-02-21 NOTE — ED Notes (Signed)
ED TO INPATIENT HANDOFF REPORT  ED Nurse Name and Phone #: Anda Kraft [phone]  S Name/Age/Gender Edward Singleton 68 y.o. male Room/Bed: ED24A/ED24A  Code Status   Code Status: Full Code  Home/SNF/Other Home Patient oriented to: self Is this baseline? Yes   Triage Complete: Triage complete  Chief Complaint alt mental status  Triage Note Pt presents via EMS from home where he lives alone. Pt found in muidst of empty liquor bottles and half consumed etoh drinks. Pt found by neighbor and is unable to answer orientation or health hx questions. Pt is altered and unable to answer orientation questions.   Allergies No Known Allergies  Level of Care/Admitting Diagnosis ED Disposition    ED Disposition Condition [facility] Area: [place] [[id number]]  Level of Care: Med-Surg [16]  Covid Evaluation: Screening Protocol (No Symptoms)  Diagnosis: Alcoholic ketoacidosis [425956]  Admitting Physician: Lance Coon [[id number]]  Attending Physician: Lance Coon [phone]  Estimated length of stay: past midnight tomorrow  Certification:: I certify this patient will need inpatient services for at least 2 midnights  PT Class (Do Not Modify): Inpatient [101]  PT Acc Code (Do Not Modify): Private [1]       B Medical/Surgery History Past Medical History:  Diagnosis Date  . Alcohol abuse   . Atrophic kidney   . Benign enlargement of prostate   . Dementia ([place])    due to alcohol  . Diabetes mellitus without complication ([place])   . Gout   . Gout   . Hypercholesteremia   . Hypertension   . Kidney stones   . Left flank pain, chronic   . Pelvic fracture ([place]) 11/17/2014   Past Surgical History:  Procedure Laterality Date  . left renal stent placement  2013  . LITHOTRIPSY    . ORIF HUMERUS FRACTURE Left 11/18/2014   Procedure: OPEN REDUCTION INTERNAL FIXATION (ORIF) DISTAL HUMERUS FRACTURE;  Surgeon: Rozanna Box, MD;  Location: [facility];  Service:  Orthopedics;  Laterality: Left;  . SPLENECTOMY       A IV Location/Drains/Wounds Patient Lines/Drains/Airways Status   Active Line/Drains/Airways    Name:   Placement date:   Placement time:   Site:   Days:   Peripheral IV 02/20/19 Left Antecubital   02/20/19    1915    Antecubital   1   Peripheral IV 02/21/19 Right Antecubital   02/21/19    0100    Antecubital   less than 1   Incision (Closed) 11/18/14 Arm Left   11/18/14    1622     1556          Intake/Output Last 24 hours No intake or output data in the 24 hours ending 02/21/19 0159  Labs/Imaging Results for orders placed or performed during the hospital encounter of 02/20/19 (from the past 48 hour(s))  CBC with Differential/Platelet     Status: Abnormal   Collection Time: 02/20/19  7:57 PM  Result Value Ref Range   WBC 17.3 (H) 4.0 - 10.5 K/uL   RBC 4.25 4.22 - 5.81 MIL/uL   Hemoglobin 14.2 13.0 - 17.0 g/dL   HCT 42.3 39.0 - 52.0 %   MCV 99.5 80.0 - 100.0 fL   MCH 33.4 26.0 - 34.0 pg   MCHC 33.6 30.0 - 36.0 g/dL   RDW 13.9 11.5 - 15.5 %   Platelets 347 150 - 400 K/uL   nRBC 0.2 0.0 - 0.2 %   Neutrophils Relative %  85 %   Neutro Abs 14.6 (H) 1.7 - 7.7 K/uL   Lymphocytes Relative 6 %   Lymphs Abs 1.0 0.7 - 4.0 K/uL   Monocytes Relative 7 %   Monocytes Absolute 1.2 (H) 0.1 - 1.0 K/uL   Eosinophils Relative 0 %   Eosinophils Absolute 0.1 0.0 - 0.5 K/uL   Basophils Relative 0 %   Basophils Absolute 0.1 0.0 - 0.1 K/uL   Immature Granulocytes 2 %   Abs Immature Granulocytes 0.39 (H) 0.00 - 0.07 K/uL    Comment: Performed at [facility], [address] [place], [place] [address]  Comprehensive metabolic panel     Status: Abnormal   Collection Time: 02/20/19  7:57 PM  Result Value Ref Range   Sodium 131 (L) 135 - 145 mmol/L    Comment: LYTES REPEATED  MLK   Potassium 3.9 3.5 - 5.1 mmol/L   Chloride 90 (L) 98 - 111 mmol/L   CO2 10 (L) 22 - 32 mmol/L   Glucose, Bld 205 (H) 70 - 99 mg/dL   BUN 30 (H) 8 -  23 mg/dL   Creatinine, Ser 1.91 (H) 0.61 - 1.24 mg/dL   Calcium 8.9 8.9 - 10.3 mg/dL   Total Protein 8.2 (H) 6.5 - 8.1 g/dL   Albumin 4.7 3.5 - 5.0 g/dL   AST 40 15 - 41 U/L   ALT 25 0 - 44 U/L   Alkaline Phosphatase 142 (H) 38 - 126 U/L   Total Bilirubin 1.4 (H) 0.3 - 1.2 mg/dL   GFR calc non Af Amer 36 (L) >60 mL/min   GFR calc Af Amer 41 (L) >60 mL/min   Anion gap 31 (H) 5 - 15    Comment: Performed at [facility], [address] [place], [place] [address]  CK     Status: Abnormal   Collection Time: 02/20/19  7:57 PM  Result Value Ref Range   Total CK 480 (H) 49 - 397 U/L    Comment: Performed at [facility], [address] [place], [place] [address]  Troponin I - ONCE - STAT     Status: None   Collection Time: 02/20/19  7:57 PM  Result Value Ref Range   Troponin I <0.03 <0.03 ng/mL    Comment: Performed at [facility], [address] [place], [place] [address]  Ethanol     Status: Abnormal   Collection Time: 02/20/19  7:57 PM  Result Value Ref Range   Alcohol, Ethyl (B) 78 (H) <10 mg/dL    Comment: (NOTE) Lowest detectable limit for serum alcohol is 10 mg/dL. For medical purposes only. Performed at [facility], [address] [place], [place] [address]   Urine Drug Screen, Qualitative [facility] only)     Status: None   Collection Time: 02/20/19  7:57 PM  Result Value Ref Range   Tricyclic, Ur Screen NONE DETECTED NONE DETECTED   Amphetamines, Ur Screen NONE DETECTED NONE DETECTED   MDMA (Ecstasy)Ur Screen NONE DETECTED NONE DETECTED   Cocaine Metabolite,Ur [place] NONE DETECTED NONE DETECTED   Opiate, Ur Screen NONE DETECTED NONE DETECTED   Phencyclidine (PCP) Ur S NONE DETECTED NONE DETECTED   Cannabinoid 50 Ng, Ur [place] NONE DETECTED NONE DETECTED   Barbiturates, Ur Screen NONE DETECTED NONE DETECTED   Benzodiazepine, Ur Scrn NONE DETECTED NONE DETECTED   Methadone Scn, Ur NONE DETECTED NONE DETECTED    Comment:  (NOTE) Tricyclics + metabolites, urine    Cutoff  1000 ng/mL Amphetamines + metabolites, urine  Cutoff 1000 ng/mL MDMA (Ecstasy), urine              Cutoff 500 ng/mL Cocaine Metabolite, urine          Cutoff 300 ng/mL Opiate + metabolites, urine        Cutoff 300 ng/mL Phencyclidine (PCP), urine         Cutoff 25 ng/mL Cannabinoid, urine                 Cutoff 50 ng/mL Barbiturates + metabolites, urine  Cutoff 200 ng/mL Benzodiazepine, urine              Cutoff 200 ng/mL Methadone, urine                   Cutoff 300 ng/mL The urine drug screen provides only a preliminary, unconfirmed analytical test result and should not be used for non-medical purposes. Clinical consideration and professional judgment should be applied to any positive drug screen result due to possible interfering substances. A more specific alternate chemical method must be used in order to obtain a confirmed analytical result. Gas chromatography / mass spectrometry (GC/MS) is the preferred confirmat ory method. Performed at [facility], [address] [place], [place] [address]   Urinalysis, Complete w Microscopic     Status: Abnormal   Collection Time: 02/20/19  7:57 PM  Result Value Ref Range   Color, Urine YELLOW (A) YELLOW   APPearance HAZY (A) CLEAR   Specific Gravity, Urine 1.014 1.005 - 1.030   pH 5.0 5.0 - 8.0   Glucose, UA 150 (A) NEGATIVE mg/dL   Hgb urine dipstick LARGE (A) NEGATIVE   Bilirubin Urine NEGATIVE NEGATIVE   Ketones, ur 20 (A) NEGATIVE mg/dL   Protein, ur 100 (A) NEGATIVE mg/dL   Nitrite NEGATIVE NEGATIVE   Leukocytes,Ua NEGATIVE NEGATIVE   WBC, UA 0-5 0 - 5 WBC/hpf   Bacteria, UA NONE SEEN NONE SEEN   Squamous Epithelial / LPF NONE SEEN 0 - 5   Mucus PRESENT     Comment: Performed at [facility], [address] [place], [place] [address]  Basic metabolic panel     Status: Abnormal   Collection Time: 02/21/19 12:56 AM  Result Value Ref Range   Sodium 132  (L) 135 - 145 mmol/L   Potassium 3.5 3.5 - 5.1 mmol/L   Chloride 96 (L) 98 - 111 mmol/L   CO2 16 (L) 22 - 32 mmol/L   Glucose, Bld 160 (H) 70 - 99 mg/dL   BUN 27 (H) 8 - 23 mg/dL   Creatinine, Ser 1.61 (H) 0.61 - 1.24 mg/dL   Calcium 8.2 (L) 8.9 - 10.3 mg/dL   GFR calc non Af Amer 44 (L) >60 mL/min   GFR calc Af Amer 51 (L) >60 mL/min   Anion gap 20 (H) 5 - 15    Comment: Performed at [facility], [address] [place], [place] [address]   Ct Head Wo Contrast  Result Date: 02/20/2019 CLINICAL DATA:  Encephalopathy EXAM: CT HEAD WITHOUT CONTRAST TECHNIQUE: Contiguous axial images were obtained from the base of the skull through the vertex without intravenous contrast. COMPARISON:  CT head dated 03/24/1999 FINDINGS: Brain: There is extensive encephalomalacia involving the right temporoparietal region. There is a hypodensity in the left parietal lobe, likely representing an old infarct. This is stable from prior CT. The right temporoparietal encephalomalacia is new from prior CT. Age related  volume loss is noted. There is no midline shift. There is no intracranial hemorrhage. Vascular: No hyperdense vessel or unexpected calcification. Skull: Normal. Negative for fracture or focal lesion. Sinuses/Orbits: No acute finding. Other: None. IMPRESSION: 1. No acute intracranial abnormality. 2. 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 3. Age related volume loss with progression since 2016. Electronically Signed   By: Constance Holster M.D.   On: 02/20/2019 20:45    Pending Labs Unresulted Labs (From admission, onward)    Start     Ordered   02/21/19 0141  Magnesium  Add-on,   AD     02/21/19 0140   02/21/19 0106  SARS Coronavirus 2 (CEPHEID - Performed in [place] hospital lab), Hosp Order  (Asymptomatic Patients Labs)  ONCE - STAT,   STAT    Question:  Rule Out  Answer:  Yes   02/21/19 0105   02/20/19 2049  Volatiles,Blood (acetone,ethanol,isoprop,methanol)  ONCE  - STAT,   STAT     02/20/19 2048   Signed and Held  HIV antibody (Routine Testing)  Once,   R     Signed and Held   Signed and Held  CBC  (heparin)  Once,   R    Comments:  Baseline for heparin therapy IF NOT ALREADY DRAWN.  Notify MD if PLT < 100 K.    Signed and Held   Signed and Held  Creatinine, serum  (heparin)  Once,   R    Comments:  Baseline for heparin therapy IF NOT ALREADY DRAWN.    Signed and Held   Signed and Held  Basic metabolic panel  Tomorrow morning,   R     Signed and Held   Signed and Held  CBC  Tomorrow morning,   R     Signed and Held          Vitals/Pain Today's Vitals   02/21/19 0034 02/21/19 0057 02/21/19 0100 02/21/19 0148  BP: (!) 162/90 (!) 171/98 (!) 163/110 (!) 165/96  Pulse: (!) 114 (!) 106 (!) 110 90  Resp: 19 (!) 21 (!) 23 19  Temp:      SpO2: 95% 95% 96% (!) 84%  Weight:      Height:      PainSc:        Isolation Precautions No active isolations  Medications Medications  LORazepam (ATIVAN) injection 0-4 mg (2 mg Intravenous Given 02/21/19 0144)    Or  LORazepam (ATIVAN) tablet 0-4 mg ( Oral See Alternative 02/21/19 0144)  LORazepam (ATIVAN) injection 0-4 mg (has no administration in time range)    Or  LORazepam (ATIVAN) tablet 0-4 mg (has no administration in time range)  thiamine (VITAMIN B-1) tablet 100 mg (has no administration in time range)    Or  thiamine (B-1) injection 100 mg (has no administration in time range)  sodium chloride 0.9 % 1,000 mL with thiamine 680 mg, folic acid 1 mg, multivitamins adult 10 mL infusion (has no administration in time range)  sodium chloride 0.9 % bolus 1,000 mL (0 mLs Intravenous Stopped 02/21/19 0044)  sodium chloride 0.9 % bolus 1,000 mL (0 mLs Intravenous Stopped 02/21/19 0044)  thiamine (B-1) injection 100 mg (100 mg Intravenous Given 02/20/19 2047)    Mobility non-ambulatory High fall risk   Focused Assessments Neuro Assessment Handoff:  Swallow screen pass? Yes          Neuro  Assessment:   Neuro Checks:      Last  Documented NIHSS Modified Score:   Has TPA been given? No If patient is a Neuro Trauma and patient is going to OR before floor call report to [place] nurse: [phone] or [phone]     R Recommendations: See Admitting Provider Note  Report given to:   Additional Notes:

## 2019-02-21 NOTE — Progress Notes (Signed)
[facility] Progress Note Patient Name: Edward Singleton DOB: 1950-12-11 MRN: [id number]   Date of Service  02/21/2019  HPI/Events of Note  Pt with a hx of altered mental status due to [facility] intoication  eICU Interventions  New patient evaluation complted        Frederik Pear 02/21/2019, 3:32 AM

## 2019-02-21 NOTE — H&P (Signed)
[facility] [place] NAME: Edward Singleton    MR#:  [id number]  DATE OF BIRTH:  [birth year]  DATE OF ADMISSION:  02/20/2019  PRIMARY CARE PHYSICIAN: Windy Fast, MD   REQUESTING/REFERRING PHYSICIAN: Owens Shark, MD  CHIEF COMPLAINT:   Chief Complaint  Patient presents with  . Alcohol Intoxication    HISTORY OF PRESENT ILLNESS:  Edward Singleton  is a 68 y.o. male who presents with chief complaint as above.  Patient presents to the ED for evaluation with altered mental status after being found in his apartment surrounded by several bottles of liquor.  He has a history of alcohol abuse.  On evaluation here in the ED work-up is consistent with alcoholic ketoacidosis as well as alcohol withdrawal.  CT head shows old stroke, though this had not been previously detected, though no acute findings.  Hospitalist called for admission  PAST MEDICAL HISTORY:   Past Medical History:  Diagnosis Date  . Alcohol abuse   . Atrophic kidney   . Benign enlargement of prostate   . 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 Diabetes mellitus without complication ([place])   . Gout   . Gout   . Hypercholesteremia   . Hypertension   . Kidney stones   . Left flank pain, chronic   . Pelvic fracture ([place]) 11/17/2014     PAST SURGICAL HISTORY:   Past Surgical History:  Procedure Laterality Date  . left renal stent placement  2013  . LITHOTRIPSY    . ORIF HUMERUS FRACTURE Left 11/18/2014   Procedure: OPEN REDUCTION INTERNAL FIXATION (ORIF) DISTAL HUMERUS FRACTURE;  Surgeon: Rozanna Box, MD;  Location: [facility];  Service: Orthopedics;  Laterality: Left;  . SPLENECTOMY       SOCIAL HISTORY:   Social History   Tobacco Use  . Smoking status: Current Every Day Smoker    Packs/day: 1.00    Years: 50.00    Pack years: 50.00    Types: Cigarettes  . Smokeless tobacco: Current User    Types: Chew  Substance Use Topics  . Alcohol use: Yes    Alcohol/week: 42.0  standard drinks    Types: 42 Cans of beer per week     FAMILY HISTORY:   Family History  Problem Relation Age of Onset  . Heart disease Mother   . Heart disease Father   . Diabetes Father   . Nephrolithiasis Paternal Grandfather   . Kidney disease Neg Hx   . Prostate cancer Neg Hx      DRUG ALLERGIES:  No Known Allergies  MEDICATIONS AT HOME:   Prior to Admission medications   Medication Sig Start Date End Date Taking? Authorizing Provider  allopurinol (ZYLOPRIM) 300 MG tablet Take 0.5 tablets (150 mg total) by mouth daily. 12/28/15   Coral Spikes, DO  amLODipine (NORVASC) 5 MG tablet Take 1 tablet (5 mg total) by mouth daily. 04/11/16   Wellington Hampshire, MD  aspirin 81 MG tablet Take 81 mg by mouth daily.    [provider]  escitalopram (LEXAPRO) 20 MG tablet Take 0.5 tablets (10 mg total) by mouth daily. 12/28/15   Coral Spikes, DO  folic acid (FOLVITE) 1 MG tablet Take 1 tablet (1 mg total) by mouth daily. 08/13/15   Coral Spikes, DO  metFORMIN (GLUCOPHAGE) 500 MG tablet Take 500 mg by mouth 2 (two) times daily with a meal. Lunch and Dinner  [provider]  metoprolol succinate (TOPROL-XL) 25 MG 24 hr tablet Take 1 tablet (25 mg total) by mouth daily. 12/28/15   Coral Spikes, DO  rosuvastatin (CRESTOR) 20 MG tablet Take one tab daily Patient taking differently: Take 20 mg by mouth daily. Take one tab daily 12/11/15   Coral Spikes, DO  thiamine (VITAMIN B-1) 100 MG tablet Take 100 mg by mouth daily.    [provider]  traZODone (DESYREL) 50 MG tablet Take 1 tablet (50 mg total) by mouth at bedtime. 12/28/15   Coral Spikes, DO    REVIEW OF SYSTEMS:  Review of Systems  Unable to perform ROS: Acuity of condition     VITAL SIGNS:   Vitals:   02/20/19 2300 02/20/19 2330 02/21/19 0034 02/21/19 0057  BP: (!) 159/92 (!) 159/83 (!) 162/90 (!) 171/98  Pulse: (!) 105 (!) 109 (!) 114 (!) 106  Resp: (!) 24 17 19  (!) 21  Temp:      SpO2: 100% 99%  95% 95%  Weight:      Height:       Wt Readings from Last 3 Encounters:  02/20/19 77.1 kg  09/26/18 70.3 kg  09/10/18 70.3 kg    PHYSICAL EXAMINATION:  Physical Exam  Vitals reviewed. Constitutional: He appears well-developed and well-nourished.  HENT:  Head: Normocephalic and atraumatic.  Mouth/Throat: Oropharynx is clear and moist.  Eyes: Pupils are equal, round, and reactive to light. Conjunctivae and EOM are normal. No scleral icterus.  Neck: Normal range of motion. Neck supple. No JVD present. No thyromegaly present.  Cardiovascular: Regular rhythm and intact distal pulses. Exam reveals no gallop and no friction rub.  No murmur heard. Tachycardic  Respiratory: Effort normal and breath sounds normal. No respiratory distress. He has no wheezes. He has no rales.  GI: Soft. Bowel sounds are normal. He exhibits no distension. There is no abdominal tenderness.  Musculoskeletal: Normal range of motion.        General: No edema.     Comments: No arthritis, no gout  Lymphadenopathy:    He has no cervical adenopathy.  Neurological: He is alert. No cranial nerve deficit.  Unable to fully assess due to patient condition  Skin: Skin is warm and dry. No rash noted. No erythema.  Psychiatric:  Unable to fully assess due to patient condition    LABORATORY PANEL:   CBC Recent Labs  Lab 02/20/19 1957  WBC 17.3*  HGB 14.2  HCT 42.3  PLT 347   ------------------------------------------------------------------------------------------------------------------  Chemistries  Recent Labs  Lab 02/20/19 1957  NA 131*  K 3.9  CL 90*  CO2 10*  GLUCOSE 205*  BUN 30*  CREATININE 1.91*  CALCIUM 8.9  AST 40  ALT 25  ALKPHOS 142*  BILITOT 1.4*   ------------------------------------------------------------------------------------------------------------------  Cardiac Enzymes Recent Labs  Lab 02/20/19 1957  TROPONINI <0.03    ------------------------------------------------------------------------------------------------------------------  RADIOLOGY:  Ct Head Wo Contrast  Result Date: 02/20/2019 CLINICAL DATA:  Encephalopathy EXAM: CT HEAD WITHOUT CONTRAST TECHNIQUE: Contiguous axial images were obtained from the base of the skull through the vertex without intravenous contrast. COMPARISON:  CT head dated 03/24/1999 FINDINGS: Brain: There is extensive encephalomalacia involving the right temporoparietal region. There is a hypodensity in the left parietal lobe, likely representing an old infarct. This is stable from prior CT. The right temporoparietal encephalomalacia is new from prior CT. Age related volume loss is noted. There is no midline shift. There is no intracranial hemorrhage. Vascular: No  hyperdense vessel or unexpected calcification. Skull: Normal. Negative for fracture or focal lesion. Sinuses/Orbits: No acute finding. Other: None. IMPRESSION: 1. No acute intracranial abnormality. 2. 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 3. Age related volume loss with progression since 2016. Electronically Signed   By: Constance Holster M.D.   On: 02/20/2019 20:45    EKG:   Orders placed or performed during the hospital encounter of 02/20/19  . ED EKG  . ED EKG  . EKG 12-Lead  . EKG 12-Lead  . EKG 12-Lead  . EKG 12-Lead    IMPRESSION AND PLAN:  Principal Problem:   Alcoholic ketoacidosis -we will hydrate the patient tonight, administer insulin for his hyperglycemia, allow him to eat a diet when he is alert enough to do so, administer thiamine and other withdrawal protocol, electrolyte abnormalities are consistent with alcoholic ketoacidosis, we will check a magnesium level as well  Active Problems:   Acute on chronic renal failure (HCC) -IV fluids, avoid nephrotoxins and monitor for improvement   Alcohol withdrawal syndrome with complication ([place]) -CIWA protocol   HTN (hypertension)  -continue home meds   CAD (coronary artery disease) -continue home medications   Diabetes ([place]) -sliding scale insulin coverage   Hyperlipidemia -continue home dose antilipid  Chart review performed and case discussed with ED provider. Labs, imaging and/or ECG reviewed by provider and discussed with patient/family. Management plans discussed with the patient and/or family.  COVID-19 status: Test Pending  DVT PROPHYLAXIS: SubQ heparin   GI PROPHYLAXIS:  None  ADMISSION STATUS: Inpatient     CODE STATUS: Full    Code Status Orders  (From admission, onward)         Start     Ordered   02/21/19 0045  Full code  Continuous     02/21/19 0045        Code Status History    Date Active Date Inactive Code Status Order ID Comments User Context   07/23/2015 0231 07/27/2015 2114 Full Code [id number]  Fritzi Mandes, MD Inpatient   11/25/2014 1510 12/08/2014 1650 Full Code [id number]  Cathlyn Parsons, PA-C Inpatient   11/25/2014 1510 11/25/2014 1510 Full Code [id number]  Cathlyn Parsons, PA-C Inpatient   11/17/2014 1518 11/25/2014 1510 Full Code [id number]  Lisette Abu, PA-C Inpatient    Advance Directive Documentation     Most Recent Value  Type of Advance Directive  Healthcare Power of Attorney  Pre-existing out of facility DNR order (yellow form or pink MOST form)  -  "MOST" Form in Place?  -      TOTAL TIME TAKING CARE OF THIS PATIENT: 45 minutes.   This patient was evaluated in the context of the global COVID-19 pandemic, which necessitated consideration that the patient might be at risk for infection with the SARS-CoV-2 virus that causes COVID-19. Institutional protocols and algorithms that pertain to the evaluation of patients at risk for COVID-19 are in a state of rapid change based on information released by regulatory bodies including the CDC and federal and state organizations. These policies and algorithms were followed to the best of this provider's knowledge to date  during the patient's care at this facility.  Ethlyn Daniels 02/21/2019, 1:22 AM  [facility]  Office  [phone]  CC: Primary care physician; Windy Fast, MD  Note:  This document was prepared using Dragon voice recognition software and may include unintentional dictation errors.

## 2019-02-21 NOTE — ED Provider Notes (Addendum)
I assumed care of the patient from Dr. Alfred Levins at 11:00 PM.  I was notified by the nursing staff that the patient was markedly confused.  On my arrival to the room patient is confused tachycardic tremulous and as such I suspect alcohol withdrawal.  Patient states that he has had alcohol withdrawals in the past but denied any seizure activity.  In addition I was told by the nursing staff that the patient's power of attorney is also requesting admission.  Review of the patient's lab consistent with alcoholic ketoacidosis current clinical findings consistent with alcohol withdrawal.  Patient given IV Ativan as per CIWA protocol as such patient discussed with hospitalist for hospital admission further evaluation and management.  CRITICAL CARE Performed by: Gregor Hams   Total critical care time: 30 minutes  Critical care time was exclusive of separately billable procedures and treating other patients.  Critical care was necessary to treat or prevent imminent or life-threatening deterioration.  Critical care was time spent personally by me on the following activities: development of treatment plan with patient and/or surrogate as well as nursing, discussions with consultants, evaluation of patient's response to treatment, examination of patient, obtaining history from patient or surrogate, ordering and performing treatments and interventions, ordering and review of laboratory studies, ordering and review of radiographic studies, pulse oximetry and re-evaluation of patient's condition.    Gregor Hams, MD 02/21/19 0340    Gregor Hams, MD 02/21/19 [phone]

## 2019-02-21 NOTE — ED Notes (Addendum)
Condom catheter placed, patient attempting to use bedpan. Increasingly agitated and more confused, OK with MD to give additional 2mg  Ativan to make 4mg  total

## 2019-02-21 NOTE — Progress Notes (Signed)
Updated pts POA Chauncy Passy via telephone regarding pts condition and all questions were answered will continue to monitor and assess pt.  Marda Stalker, [place] Pager [phone] (please enter 7 digits) PCCM Consult Pager [phone] (please enter 7 digits)

## 2019-02-22 LAB — CBC
HCT: 34.4 % — ABNORMAL LOW (ref 39.0–52.0)
Hemoglobin: 11.5 g/dL — ABNORMAL LOW (ref 13.0–17.0)
MCH: 33 pg (ref 26.0–34.0)
MCHC: 33.4 g/dL (ref 30.0–36.0)
MCV: 98.9 fL (ref 80.0–100.0)
Platelets: 261 10*3/uL (ref 150–400)
RBC: 3.48 MIL/uL — ABNORMAL LOW (ref 4.22–5.81)
RDW: 14.2 % (ref 11.5–15.5)
WBC: 16.8 10*3/uL — ABNORMAL HIGH (ref 4.0–10.5)
nRBC: 0.2 % (ref 0.0–0.2)

## 2019-02-22 LAB — VOLATILES,BLD-ACETONE,ETHANOL,ISOPROP,METHANOL
Acetone, blood: NEGATIVE % (ref 0.000–0.010)
Ethanol, blood: 0.038 % — ABNORMAL HIGH (ref 0.000–0.010)
Isopropanol, blood: NEGATIVE % (ref 0.000–0.010)
Methanol, blood: NEGATIVE % (ref 0.000–0.010)

## 2019-02-22 LAB — COMPREHENSIVE METABOLIC PANEL
ALT: 17 U/L (ref 0–44)
AST: 27 U/L (ref 15–41)
Albumin: 3.4 g/dL — ABNORMAL LOW (ref 3.5–5.0)
Alkaline Phosphatase: 112 U/L (ref 38–126)
Anion gap: 11 (ref 5–15)
BUN: 21 mg/dL (ref 8–23)
CO2: 21 mmol/L — ABNORMAL LOW (ref 22–32)
Calcium: 8.2 mg/dL — ABNORMAL LOW (ref 8.9–10.3)
Chloride: 105 mmol/L (ref 98–111)
Creatinine, Ser: 1.52 mg/dL — ABNORMAL HIGH (ref 0.61–1.24)
GFR calc Af Amer: 55 mL/min — ABNORMAL LOW (ref >60)
GFR calc non Af Amer: 47 mL/min — ABNORMAL LOW (ref >60)
Glucose, Bld: 223 mg/dL — ABNORMAL HIGH (ref 70–99)
Potassium: 3.4 mmol/L — ABNORMAL LOW (ref 3.5–5.1)
Sodium: 137 mmol/L (ref 135–145)
Total Bilirubin: 1.4 mg/dL — ABNORMAL HIGH (ref 0.3–1.2)
Total Protein: 6 g/dL — ABNORMAL LOW (ref 6.5–8.1)

## 2019-02-22 LAB — CK: Total CK: 591 U/L — ABNORMAL HIGH (ref 49–397)

## 2019-02-22 LAB — GLUCOSE, CAPILLARY
Glucose-Capillary: 152 mg/dL — ABNORMAL HIGH (ref 70–99)
Glucose-Capillary: 132 mg/dL — ABNORMAL HIGH (ref 70–99)
Glucose-Capillary: 84 mg/dL (ref 70–99)
Glucose-Capillary: 91 mg/dL (ref 70–99)

## 2019-02-22 LAB — HIV ANTIBODY (ROUTINE TESTING W REFLEX): HIV Screen 4th Generation wRfx: NONREACTIVE

## 2019-02-22 MED ORDER — DIAZEPAM 2 MG PO TABS
5.0000 mg | ORAL_TABLET | Freq: Four times a day (QID) | ORAL | Status: DC
  Administered 2019-02-22 – 2019-02-23 (×4): 5 mg via ORAL
  Filled 2019-02-22 (×4): qty 3

## 2019-02-22 MED ORDER — NICOTINE 21 MG/24HR TD PT24
21.0000 mg | MEDICATED_PATCH | Freq: Every day | TRANSDERMAL | Status: DC
  Administered 2019-02-22 – 2019-02-23 (×2): 21 mg via TRANSDERMAL
  Filled 2019-02-22 (×2): qty 1

## 2019-02-22 NOTE — Progress Notes (Signed)
Pt was anxious and shaky at the beginning of the shift.  He gradually became more anxious, more boisterous, pulling at lines, saying that he was leaving "for a smoke".  Redirection was not successful.  Pt even attempted to eat toilet paper unsuccessfully.  His hr was increasing as well as his anxiety level.  Ativan was given as ordered but the effects did not last.  Precedex was started as ordered.

## 2019-02-22 NOTE — Progress Notes (Signed)
Inpatient Diabetes Program Recommendations  AACE/ADA: New Consensus Statement on Inpatient Glycemic Control (2015)  Target Ranges:  Prepandial:   less than 140 mg/dL      Peak postprandial:   less than 180 mg/dL (1-2 hours)      Critically ill patients:  140 - 180 mg/dL   Lab Results  Component Value Date   GLUCAP 132 (H) 02/22/2019   HGBA1C 5.6 01/15/2014    Review of Glycemic Control Results for Edward Singleton, Edward Singleton (MRN [id number]) as of 02/22/2019 13:10  Ref. Range 02/21/2019 12:09 02/21/2019 17:51 02/21/2019 21:42 02/22/2019 08:50 02/22/2019 12:43  Glucose-Capillary Latest Ref Range: 70 - 99 mg/dL 138 (H) 141 (H) 124 (H) 152 (H) 132 (H)   Diabetes history: DM 2 Outpatient Diabetes medications:  Metformin 500 mg bid Current orders for Inpatient glycemic control:  Novolog sensitive tid with meals and HS  Inpatient Diabetes Program Recommendations:    Note hx. of ETOH abuse. Blood sugars currently well controlled in hospital.  Upon d/c home, would not resume Metformin due to patient's hx. Of ETOH abuse.   Thanks,  Adah Perl, RN, BC-ADM Inpatient Diabetes Coordinator Pager [phone]

## 2019-02-22 NOTE — Progress Notes (Signed)
[facility] at [place] NAME: Edward Singleton    MR#:  [id number]  DATE OF BIRTH:  Jul 08, 1951  SUBJECTIVE:   Patient alert confused on Precedex drip  REVIEW OF SYSTEMS:    Review of Systems  Constitutional: Negative for fever, chills weight loss HENT: Negative for ear pain, nosebleeds, congestion, facial swelling, rhinorrhea, neck pain, neck stiffness and ear discharge.   Respiratory: Negative for cough, shortness of breath, wheezing  Cardiovascular: Negative for chest pain, palpitations and leg swelling.  Gastrointestinal: Negative for heartburn, abdominal pain, vomiting, diarrhea or consitpation Genitourinary: Negative for dysuria, urgency, frequency, hematuria Musculoskeletal: Negative for back pain or joint pain Neurological: Negative for dizziness, seizures, syncope, focal weakness,  numbness and headaches.  Hematological: Does not bruise/bleed easily.  Psychiatric/Behavioral: Negative for hallucinations, confusion, dysphoric mood    Tolerating Diet: yes      DRUG ALLERGIES:  No Known Allergies  VITALS:  Blood pressure 122/79, pulse 69, temperature 97.7 F (36.5 C), temperature source Axillary, resp. rate 16, height 5\' 7"  (1.702 m), weight 77.5 kg, SpO2 94 %.  PHYSICAL EXAMINATION:  Constitutional: Appears well-developed and well-nourished. No distress. HENT: Normocephalic. Marland Kitchen Oropharynx is clear and moist.  Eyes: Conjunctivae and EOM are normal. PERRLA, no scleral icterus.  Neck: Normal ROM. Neck supple. No JVD. No tracheal deviation. CVS: RRR, S1/S2 +, no murmurs, no gallops, no carotid bruit.  Pulmonary: Effort and breath sounds normal, no stridor, rhonchi, wheezes, rales.  Abdominal: Soft. BS +,  no distension, tenderness, rebound or guarding.  Musculoskeletal: Normal range of motion. No edema and no tenderness.  Neuro: Alert. CN 2-12 grossly intact. No focal deficits.  Oriented to name only No Asterixis or tremors Skin:  Skin is warm and dry. No rash noted. Psychiatric: A bit confused    LABORATORY PANEL:   CBC Recent Labs  Lab 02/22/19 0437  WBC 16.8*  HGB 11.5*  HCT 34.4*  PLT 261   ------------------------------------------------------------------------------------------------------------------  Chemistries  Recent Labs  Lab 02/21/19 0056  02/22/19 0437  NA 132*   < > 137  K 3.5   < > 3.4*  CL 96*   < > 105  CO2 16*   < > 21*  GLUCOSE 160*   < > 223*  BUN 27*   < > 21  CREATININE 1.61*   < > 1.52*  CALCIUM 8.2*   < > 8.2*  MG 1.9  --   --   AST  --   --  27  ALT  --   --  17  ALKPHOS  --   --  112  BILITOT  --   --  1.4*   < > = values in this interval not displayed.   ------------------------------------------------------------------------------------------------------------------  Cardiac Enzymes Recent Labs  Lab 02/20/19 1957  TROPONINI <0.03   ------------------------------------------------------------------------------------------------------------------  RADIOLOGY:  Ct Head Wo Contrast  Result Date: 02/20/2019 CLINICAL DATA:  Encephalopathy EXAM: CT HEAD WITHOUT CONTRAST TECHNIQUE: Contiguous axial images were obtained from the base of the skull through the vertex without intravenous contrast. COMPARISON:  CT head dated 03/24/1999 FINDINGS: Brain: There is extensive encephalomalacia involving the right temporoparietal region. There is a hypodensity in the left parietal lobe, likely representing an old infarct. This is stable from prior CT. The right temporoparietal encephalomalacia is new from prior CT. Age related volume loss is noted. There is no midline shift. There is no intracranial hemorrhage. Vascular: No hyperdense vessel or unexpected calcification. Skull: Normal. Negative for  fracture or focal lesion. Sinuses/Orbits: No acute finding. Other: None. IMPRESSION: 1. No acute intracranial abnormality. 2. 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 3.  Age related volume loss with progression since 2016. Electronically Signed   By: Constance Holster M.D.   On: 02/20/2019 20:45   Dg Chest Port 1 View  Result Date: 02/21/2019 CLINICAL DATA:  Tachypnea. Question aspiration in the setting of ETOH intoxication. EXAM: PORTABLE CHEST 1 VIEW COMPARISON:  Chest radiograph 03/24/2015 FINDINGS: Normal heart size and mediastinal contours. Mild bronchial and interstitial coarsening without focal airspace disease. No pleural effusion or pneumothorax. No pulmonary edema. Remote left proximal humerus fracture. IMPRESSION: Mild bronchial and interstitial coarsening without focal airspace disease. Electronically Signed   By: Keith Rake M.D.   On: 02/21/2019 03:49     ASSESSMENT AND PLAN:   68 year old male with EtOH and tobacco dependence who presented to the emergency room due to alcohol intoxication.   1.  EtOH abuse/withdrawal: Patient is currently on Precedex drip.  2 Hypokalemia: Replete   3.  Acute kidney injury in the setting of chronic kidney disease stage II which has improved with IV fluids  4.  Elevated CK in the setting of dehydration: This is improved with IV fluids 5. Elevated WBC from dehydration  6. HTN: Can restart metoprolol and norvasc  7. Diabetes: per ICU protcol  Continue SSI  8. Tobacco dependence: Patient is encouraged to quit smoking and willing to attempt to quit was assessed. Patient NOT highly motivated.Counseling was provided for 4 minutes. Wants a nicotine patch.(actually asked for a cigarette)    Management plans discussed with the patient and he is in agreement.  CODE STATUS: full  TOTAL TIME TAKING CARE OF THIS PATIENT: 30 minutes.     POSSIBLE D/C TBD, DEPENDING ON CLINICAL CONDITION.   Bettey Costa M.D on 02/22/2019 at 10:44 AM  Between 7am to 6pm - Pager - [phone] After 6pm go to [email or web address] - password [email or web address] [facility]  Office  [phone]  CC: Primary care  physician; Windy Fast, MD  Note: This dictation was prepared with Dragon dictation along with smaller phrase technology. Any transcriptional errors that result from this process are unintentional.

## 2019-02-22 NOTE — TOC Progression Note (Signed)
Transition of Care [facility]) - Progression Note    Patient Details  Name: Edward Singleton MRN: [id number] Date of Birth: [birth year]  Transition of Care [facility]) CM/SW [place], RN Phone Number: 02/22/2019, 2:35 PM  Clinical Narrative:    [name] was stopped yesterday, has valium.5 every 6 hours PO on Precedex, continue to Monitor for needs  Expected Discharge Plan: Home/Self Care Barriers to Discharge: Continued Medical Work up  Expected Discharge Plan and Services Expected Discharge Plan: Home/Self Care   Discharge Planning Services: CM Consult   Living arrangements for the past 2 months: Single Family Home                                       Social Determinants of Health (SDOH) Interventions    Readmission Risk Interventions No flowsheet data found.

## 2019-02-23 DIAGNOSIS — E872 Acidosis: Secondary | ICD-10-CM

## 2019-02-23 LAB — GLUCOSE, CAPILLARY
Glucose-Capillary: 110 mg/dL — ABNORMAL HIGH (ref 70–99)
Glucose-Capillary: 132 mg/dL — ABNORMAL HIGH (ref 70–99)

## 2019-02-23 MED ORDER — METOPROLOL TARTRATE 5 MG/5ML IV SOLN: 2.5000 mg | Freq: Four times a day (QID) | INTRAVENOUS | Status: DC | PRN

## 2019-02-23 MED ORDER — HYDROCODONE-ACETAMINOPHEN 5-325 MG PO TABS
1.0000 | ORAL_TABLET | Freq: Four times a day (QID) | ORAL | Status: DC | PRN
  Administered 2019-02-23: 2 via ORAL
  Filled 2019-02-23: qty 2

## 2019-02-23 MED ORDER — DIAZEPAM 5 MG PO TABS
5.0000 mg | ORAL_TABLET | Freq: Four times a day (QID) | ORAL | Status: DC
  Administered 2019-02-23: 5 mg via ORAL
  Filled 2019-02-23: qty 1

## 2019-02-23 MED ORDER — DIAZEPAM 2 MG PO TABS: 5.0000 mg | ORAL_TABLET | Freq: Four times a day (QID) | ORAL | Status: DC

## 2019-02-23 MED ORDER — DIAZEPAM 2 MG PO TABS
5.0000 mg | ORAL_TABLET | Freq: Four times a day (QID) | ORAL | Status: DC
  Filled 2019-02-23: qty 3

## 2019-02-23 NOTE — Discharge Summary (Signed)
Physician Discharge Summary  Patient ID: Edward Singleton MRN: [id number] DOB/AGE: [birth year] 67 y.o.  Admit date: 02/20/2019 Discharge date: 02/23/2019  Admission [facility]  Discharge Diagnoses:  Principal Problem:   Alcoholic ketoacidosis Active Problems:   HTN (hypertension)   Acute on chronic renal failure (HCC)   CAD (coronary artery disease)   Hyperlipidemia   Alcohol withdrawal syndrome with complication ([place])   Diabetes [facility])     Hospital Course: ICU monitoring precedex infusions Valium ORAL  PATIENT REFUSED FURTHER CARE AND MANAGEMENT HE WANTS TO GO HOME  HE IS ALERT AND AWAKE FOLLOWS COMMANDS  PATIENT HAS SIGNED [place] PAPERS    Discharge Exam: Blood pressure 133/73, pulse 100, temperature 98.2 F (36.8 C), temperature source Oral, resp. rate (!) 24, height 5\' 7"  (1.702 m), weight 77.5 kg, SpO2 92 %.   Disposition: SIGNED OUT AMA      Signed: Flora Lipps 02/23/2019, 4:00 PM

## 2019-02-23 NOTE — Progress Notes (Signed)
A&O. Sitting up in bed. Denies having SOB or Pain at this time.

## 2019-02-23 NOTE — Progress Notes (Addendum)
Patient is alert and oriented Patient wants to go home  BP 133/73   Pulse 100   Temp 98.2 F (36.8 C) (Oral)   Resp (!) 24   Ht 5\' 7"  (1.702 m)   Wt 77.5 kg   SpO2 92%   BMI 26.76 kg/m    Patient will sign AMA papers. Family has been notified    Corrin Parker, M.D.  [facility]  Medical Director [place] Director [facility] Cardio-Pulmonary Department

## 2019-03-14 ENCOUNTER — Inpatient Hospital Stay
Admission: EM | Admit: 2019-03-14 | Discharge: 2019-03-19 | DRG: 641 | Disposition: A | Discharge status: Skilled Nursing Facility | Payer: Medicare Other | Attending: Internal Medicine | Admitting: Internal Medicine

## 2019-03-14 ENCOUNTER — Other Ambulatory Visit: Payer: Self-pay

## 2019-03-14 ENCOUNTER — Emergency Department: Payer: Medicare Other

## 2019-03-14 DIAGNOSIS — Z9181 History of falling: Secondary | ICD-10-CM

## 2019-03-14 DIAGNOSIS — E46 Unspecified protein-calorie malnutrition: Secondary | ICD-10-CM | POA: Diagnosis present

## 2019-03-14 DIAGNOSIS — M109 Gout, unspecified: Secondary | ICD-10-CM | POA: Diagnosis present

## 2019-03-14 DIAGNOSIS — D649 Anemia, unspecified: Secondary | ICD-10-CM | POA: Diagnosis not present

## 2019-03-14 DIAGNOSIS — E1122 Type 2 diabetes mellitus with diabetic chronic kidney disease: Secondary | ICD-10-CM | POA: Diagnosis present

## 2019-03-14 DIAGNOSIS — W19XXXA Unspecified fall, initial encounter: Secondary | ICD-10-CM

## 2019-03-14 DIAGNOSIS — N400 Enlarged prostate without lower urinary tract symptoms: Secondary | ICD-10-CM | POA: Diagnosis present

## 2019-03-14 DIAGNOSIS — D72829 Elevated white blood cell count, unspecified: Secondary | ICD-10-CM | POA: Diagnosis present

## 2019-03-14 DIAGNOSIS — E876 Hypokalemia: Principal | ICD-10-CM | POA: Diagnosis present

## 2019-03-14 DIAGNOSIS — F17210 Nicotine dependence, cigarettes, uncomplicated: Secondary | ICD-10-CM | POA: Diagnosis present

## 2019-03-14 DIAGNOSIS — F1097 Alcohol use, unspecified with alcohol-induced persisting dementia: Secondary | ICD-10-CM | POA: Diagnosis present

## 2019-03-14 DIAGNOSIS — K449 Diaphragmatic hernia without obstruction or gangrene: Secondary | ICD-10-CM | POA: Diagnosis present

## 2019-03-14 DIAGNOSIS — E785 Hyperlipidemia, unspecified: Secondary | ICD-10-CM | POA: Diagnosis present

## 2019-03-14 DIAGNOSIS — Z87442 Personal history of urinary calculi: Secondary | ICD-10-CM

## 2019-03-14 DIAGNOSIS — I129 Hypertensive chronic kidney disease with stage 1 through stage 4 chronic kidney disease, or unspecified chronic kidney disease: Secondary | ICD-10-CM | POA: Diagnosis present

## 2019-03-14 DIAGNOSIS — R296 Repeated falls: Secondary | ICD-10-CM | POA: Diagnosis present

## 2019-03-14 DIAGNOSIS — W010XXA Fall on same level from slipping, tripping and stumbling without subsequent striking against object, initial encounter: Secondary | ICD-10-CM | POA: Diagnosis present

## 2019-03-14 DIAGNOSIS — Z6821 Body mass index (BMI) 21.0-21.9, adult: Secondary | ICD-10-CM

## 2019-03-14 DIAGNOSIS — N183 Chronic kidney disease, stage 3 (moderate): Secondary | ICD-10-CM | POA: Diagnosis present

## 2019-03-14 DIAGNOSIS — Z833 Family history of diabetes mellitus: Secondary | ICD-10-CM

## 2019-03-14 DIAGNOSIS — Z9081 Acquired absence of spleen: Secondary | ICD-10-CM

## 2019-03-14 DIAGNOSIS — R195 Other fecal abnormalities: Secondary | ICD-10-CM | POA: Diagnosis present

## 2019-03-14 DIAGNOSIS — T8859XA Other complications of anesthesia, initial encounter: Secondary | ICD-10-CM | POA: Diagnosis present

## 2019-03-14 DIAGNOSIS — R2689 Other abnormalities of gait and mobility: Secondary | ICD-10-CM | POA: Diagnosis present

## 2019-03-14 DIAGNOSIS — G312 Degeneration of nervous system due to alcohol: Secondary | ICD-10-CM | POA: Diagnosis present

## 2019-03-14 DIAGNOSIS — T07XXXA Unspecified multiple injuries, initial encounter: Secondary | ICD-10-CM

## 2019-03-14 DIAGNOSIS — D509 Iron deficiency anemia, unspecified: Secondary | ICD-10-CM | POA: Diagnosis present

## 2019-03-14 DIAGNOSIS — D631 Anemia in chronic kidney disease: Secondary | ICD-10-CM | POA: Diagnosis present

## 2019-03-14 DIAGNOSIS — Z20828 Contact with and (suspected) exposure to other viral communicable diseases: Secondary | ICD-10-CM | POA: Diagnosis present

## 2019-03-14 DIAGNOSIS — N179 Acute kidney failure, unspecified: Secondary | ICD-10-CM | POA: Diagnosis not present

## 2019-03-14 DIAGNOSIS — G4700 Insomnia, unspecified: Secondary | ICD-10-CM | POA: Diagnosis present

## 2019-03-14 DIAGNOSIS — I959 Hypotension, unspecified: Secondary | ICD-10-CM | POA: Diagnosis not present

## 2019-03-14 DIAGNOSIS — Z86010 Personal history of colonic polyps: Secondary | ICD-10-CM

## 2019-03-14 DIAGNOSIS — I2510 Atherosclerotic heart disease of native coronary artery without angina pectoris: Secondary | ICD-10-CM | POA: Diagnosis present

## 2019-03-14 DIAGNOSIS — Z7982 Long term (current) use of aspirin: Secondary | ICD-10-CM

## 2019-03-14 DIAGNOSIS — M79602 Pain in left arm: Secondary | ICD-10-CM | POA: Diagnosis present

## 2019-03-14 DIAGNOSIS — K2980 Duodenitis without bleeding: Secondary | ICD-10-CM | POA: Diagnosis present

## 2019-03-14 DIAGNOSIS — E7800 Pure hypercholesterolemia, unspecified: Secondary | ICD-10-CM | POA: Diagnosis present

## 2019-03-14 DIAGNOSIS — F1010 Alcohol abuse, uncomplicated: Secondary | ICD-10-CM

## 2019-03-14 DIAGNOSIS — Z79899 Other long term (current) drug therapy: Secondary | ICD-10-CM

## 2019-03-14 DIAGNOSIS — R0902 Hypoxemia: Secondary | ICD-10-CM

## 2019-03-14 DIAGNOSIS — K210 Gastro-esophageal reflux disease with esophagitis: Secondary | ICD-10-CM | POA: Diagnosis present

## 2019-03-14 DIAGNOSIS — K922 Gastrointestinal hemorrhage, unspecified: Secondary | ICD-10-CM

## 2019-03-14 DIAGNOSIS — Z8249 Family history of ischemic heart disease and other diseases of the circulatory system: Secondary | ICD-10-CM

## 2019-03-14 DIAGNOSIS — J449 Chronic obstructive pulmonary disease, unspecified: Secondary | ICD-10-CM | POA: Diagnosis present

## 2019-03-14 DIAGNOSIS — E871 Hypo-osmolality and hyponatremia: Secondary | ICD-10-CM | POA: Diagnosis present

## 2019-03-14 DIAGNOSIS — E872 Acidosis: Secondary | ICD-10-CM | POA: Diagnosis present

## 2019-03-14 DIAGNOSIS — K2920 Alcoholic gastritis without bleeding: Secondary | ICD-10-CM | POA: Diagnosis present

## 2019-03-14 DIAGNOSIS — Z7984 Long term (current) use of oral hypoglycemic drugs: Secondary | ICD-10-CM

## 2019-03-14 LAB — CBC WITH DIFFERENTIAL/PLATELET
Abs Immature Granulocytes: 0.19 10*3/uL — ABNORMAL HIGH (ref 0.00–0.07)
Basophils Absolute: 0.1 10*3/uL (ref 0.0–0.1)
Basophils Relative: 0 %
Eosinophils Absolute: 0 10*3/uL (ref 0.0–0.5)
Eosinophils Relative: 0 %
HCT: 26.7 % — ABNORMAL LOW (ref 39.0–52.0)
Hemoglobin: 9.5 g/dL — ABNORMAL LOW (ref 13.0–17.0)
Immature Granulocytes: 1 %
Lymphocytes Relative: 6 %
Lymphs Abs: 1.1 10*3/uL (ref 0.7–4.0)
MCH: 34.3 pg — ABNORMAL HIGH (ref 26.0–34.0)
MCHC: 35.6 g/dL (ref 30.0–36.0)
MCV: 96.4 fL (ref 80.0–100.0)
Monocytes Absolute: 1.9 10*3/uL — ABNORMAL HIGH (ref 0.1–1.0)
Monocytes Relative: 10 %
Neutro Abs: 15.4 10*3/uL — ABNORMAL HIGH (ref 1.7–7.7)
Neutrophils Relative %: 83 %
Platelets: 303 10*3/uL (ref 150–400)
RBC: 2.77 MIL/uL — ABNORMAL LOW (ref 4.22–5.81)
RDW: 14.6 % (ref 11.5–15.5)
WBC: 18.7 10*3/uL — ABNORMAL HIGH (ref 4.0–10.5)
nRBC: 0.2 % (ref 0.0–0.2)

## 2019-03-14 LAB — COMPREHENSIVE METABOLIC PANEL
ALT: 15 U/L (ref 0–44)
AST: 23 U/L (ref 15–41)
Albumin: 4.2 g/dL (ref 3.5–5.0)
Alkaline Phosphatase: 139 U/L — ABNORMAL HIGH (ref 38–126)
Anion gap: 25 — ABNORMAL HIGH (ref 5–15)
BUN: 94 mg/dL — ABNORMAL HIGH (ref 8–23)
CO2: 16 mmol/L — ABNORMAL LOW (ref 22–32)
Calcium: 9.1 mg/dL (ref 8.9–10.3)
Chloride: 88 mmol/L — ABNORMAL LOW (ref 98–111)
Creatinine, Ser: 5.83 mg/dL — ABNORMAL HIGH (ref 0.61–1.24)
GFR calc Af Amer: 11 mL/min — ABNORMAL LOW (ref >60)
GFR calc non Af Amer: 9 mL/min — ABNORMAL LOW (ref >60)
Glucose, Bld: 121 mg/dL — ABNORMAL HIGH (ref 70–99)
Potassium: 2.9 mmol/L — ABNORMAL LOW (ref 3.5–5.1)
Sodium: 129 mmol/L — ABNORMAL LOW (ref 135–145)
Total Bilirubin: 1 mg/dL (ref 0.3–1.2)
Total Protein: 7.4 g/dL (ref 6.5–8.1)

## 2019-03-14 LAB — SARS CORONAVIRUS 2 BY RT PCR (HOSPITAL ORDER, PERFORMED IN ~~LOC~~ HOSPITAL LAB): SARS Coronavirus 2: NEGATIVE

## 2019-03-14 LAB — TROPONIN I: Troponin I: <0.03 ng/mL (ref <0.03)

## 2019-03-14 LAB — HEMOGLOBIN AND HEMATOCRIT, BLOOD
HCT: 20.7 % — ABNORMAL LOW (ref 39.0–52.0)
Hemoglobin: 7.2 g/dL — ABNORMAL LOW (ref 13.0–17.0)

## 2019-03-14 LAB — CK: Total CK: 189 U/L (ref 49–397)

## 2019-03-14 LAB — ETHANOL: Alcohol, Ethyl (B): 18 mg/dL — ABNORMAL HIGH (ref <10)

## 2019-03-14 LAB — MAGNESIUM: Magnesium: 1.6 mg/dL — ABNORMAL LOW (ref 1.7–2.4)

## 2019-03-14 MED ORDER — POTASSIUM CHLORIDE IN NACL 20-0.9 MEQ/L-% IV SOLN
INTRAVENOUS | Status: DC
  Administered 2019-03-14 – 2019-03-19 (×10): via INTRAVENOUS
  Filled 2019-03-14 (×13): qty 1000

## 2019-03-14 MED ORDER — ACETAMINOPHEN 325 MG PO TABS
650.0000 mg | ORAL_TABLET | Freq: Four times a day (QID) | ORAL | Status: DC | PRN
  Administered 2019-03-14: 650 mg via ORAL
  Filled 2019-03-14 (×2): qty 2

## 2019-03-14 MED ORDER — PANTOPRAZOLE SODIUM 40 MG IV SOLR
40.0000 mg | Freq: Two times a day (BID) | INTRAVENOUS | Status: DC
  Administered 2019-03-14 – 2019-03-19 (×10): 40 mg via INTRAVENOUS
  Filled 2019-03-14 (×10): qty 40

## 2019-03-14 MED ORDER — LORAZEPAM 2 MG/ML IJ SOLN: 1.0000 mg | Freq: Four times a day (QID) | INTRAMUSCULAR | Status: AC | PRN

## 2019-03-14 MED ORDER — VITAMIN B-1 100 MG PO TABS: 100.0000 mg | ORAL_TABLET | Freq: Every day | ORAL | Status: DC

## 2019-03-14 MED ORDER — SODIUM CHLORIDE 0.9 % IV BOLUS
1000.0000 mL | Freq: Once | INTRAVENOUS | Status: AC
  Administered 2019-03-14: 1000 mL via INTRAVENOUS

## 2019-03-14 MED ORDER — TRAZODONE HCL 50 MG PO TABS
50.0000 mg | ORAL_TABLET | Freq: Every day | ORAL | Status: DC
  Administered 2019-03-15 – 2019-03-19 (×5): 50 mg via ORAL
  Filled 2019-03-14 (×6): qty 1

## 2019-03-14 MED ORDER — HYDROMORPHONE HCL 1 MG/ML IJ SOLN
0.5000 mg | INTRAMUSCULAR | Status: DC | PRN
  Administered 2019-03-15 – 2019-03-18 (×7): 0.5 mg via INTRAVENOUS
  Filled 2019-03-14 (×8): qty 1

## 2019-03-14 MED ORDER — LORAZEPAM 2 MG/ML IJ SOLN: 0.0000 mg | Freq: Four times a day (QID) | INTRAMUSCULAR | Status: AC

## 2019-03-14 MED ORDER — OXYCODONE-ACETAMINOPHEN 5-325 MG PO TABS
1.0000 | ORAL_TABLET | Freq: Four times a day (QID) | ORAL | Status: DC | PRN
  Administered 2019-03-15 – 2019-03-19 (×14): 1 via ORAL
  Filled 2019-03-14 (×15): qty 1

## 2019-03-14 MED ORDER — POTASSIUM CHLORIDE CRYS ER 20 MEQ PO TBCR: 40.0000 meq | EXTENDED_RELEASE_TABLET | Freq: Once | ORAL | Status: DC

## 2019-03-14 MED ORDER — METOPROLOL SUCCINATE ER 25 MG PO TB24
25.0000 mg | ORAL_TABLET | Freq: Every day | ORAL | Status: DC
  Filled 2019-03-14: qty 1

## 2019-03-14 MED ORDER — ADULT MULTIVITAMIN W/MINERALS CH
1.0000 | ORAL_TABLET | Freq: Every day | ORAL | Status: DC
  Administered 2019-03-15 – 2019-03-19 (×5): 1 via ORAL
  Filled 2019-03-14 (×5): qty 1

## 2019-03-14 MED ORDER — SODIUM CHLORIDE 0.9 % IV BOLUS
500.0000 mL | Freq: Once | INTRAVENOUS | Status: AC
  Administered 2019-03-14: 500 mL via INTRAVENOUS

## 2019-03-14 MED ORDER — ONDANSETRON HCL 4 MG PO TABS: 4.0000 mg | ORAL_TABLET | Freq: Four times a day (QID) | ORAL | Status: DC | PRN

## 2019-03-14 MED ORDER — SENNOSIDES-DOCUSATE SODIUM 8.6-50 MG PO TABS
1.0000 | ORAL_TABLET | Freq: Every evening | ORAL | Status: DC | PRN
  Administered 2019-03-16: 1 via ORAL
  Filled 2019-03-14: qty 1

## 2019-03-14 MED ORDER — FOLIC ACID 1 MG PO TABS: 1.0000 mg | ORAL_TABLET | Freq: Every day | ORAL | Status: DC

## 2019-03-14 MED ORDER — AMLODIPINE BESYLATE 5 MG PO TABS
5.0000 mg | ORAL_TABLET | Freq: Every day | ORAL | Status: DC
  Filled 2019-03-14: qty 1

## 2019-03-14 MED ORDER — ASPIRIN 81 MG PO CHEW
81.0000 mg | CHEWABLE_TABLET | Freq: Every day | ORAL | Status: DC
  Administered 2019-03-15 – 2019-03-19 (×5): 81 mg via ORAL
  Filled 2019-03-14 (×5): qty 1

## 2019-03-14 MED ORDER — ACETAMINOPHEN 650 MG RE SUPP: 650.0000 mg | Freq: Four times a day (QID) | RECTAL | Status: DC | PRN

## 2019-03-14 MED ORDER — METFORMIN HCL 500 MG PO TABS
500.0000 mg | ORAL_TABLET | Freq: Two times a day (BID) | ORAL | Status: DC
  Administered 2019-03-15: 500 mg via ORAL
  Filled 2019-03-14: qty 1

## 2019-03-14 MED ORDER — ONDANSETRON HCL 4 MG/2ML IJ SOLN: 4.0000 mg | Freq: Four times a day (QID) | INTRAMUSCULAR | Status: DC | PRN

## 2019-03-14 MED ORDER — ONDANSETRON HCL 4 MG/2ML IJ SOLN
4.0000 mg | Freq: Once | INTRAMUSCULAR | Status: AC
  Administered 2019-03-14: 4 mg via INTRAVENOUS
  Filled 2019-03-14: qty 2

## 2019-03-14 MED ORDER — ALLOPURINOL 100 MG PO TABS
100.0000 mg | ORAL_TABLET | Freq: Every day | ORAL | Status: DC
  Administered 2019-03-15 – 2019-03-19 (×5): 100 mg via ORAL
  Filled 2019-03-14 (×5): qty 1

## 2019-03-14 MED ORDER — LORAZEPAM 2 MG/ML IJ SOLN: 0.0000 mg | Freq: Two times a day (BID) | INTRAMUSCULAR | Status: AC

## 2019-03-14 MED ORDER — POTASSIUM CHLORIDE 10 MEQ/100ML IV SOLN
10.0000 meq | Freq: Once | INTRAVENOUS | Status: AC
  Administered 2019-03-14: 10 meq via INTRAVENOUS
  Filled 2019-03-14: qty 100

## 2019-03-14 MED ORDER — NICOTINE 21 MG/24HR TD PT24
21.0000 mg | MEDICATED_PATCH | Freq: Every day | TRANSDERMAL | Status: DC
  Administered 2019-03-15 – 2019-03-17 (×3): 21 mg via TRANSDERMAL
  Filled 2019-03-14 (×5): qty 1

## 2019-03-14 MED ORDER — ROSUVASTATIN CALCIUM 10 MG PO TABS
20.0000 mg | ORAL_TABLET | Freq: Every day | ORAL | Status: DC
  Administered 2019-03-15 – 2019-03-18 (×4): 20 mg via ORAL
  Filled 2019-03-14: qty 2
  Filled 2019-03-14: qty 4
  Filled 2019-03-14 (×3): qty 2

## 2019-03-14 MED ORDER — FOLIC ACID 1 MG PO TABS
1.0000 mg | ORAL_TABLET | Freq: Every day | ORAL | Status: DC
  Administered 2019-03-15 – 2019-03-19 (×5): 1 mg via ORAL
  Filled 2019-03-14 (×5): qty 1

## 2019-03-14 MED ORDER — ESCITALOPRAM OXALATE 10 MG PO TABS
10.0000 mg | ORAL_TABLET | Freq: Every day | ORAL | Status: DC
  Administered 2019-03-15 – 2019-03-19 (×5): 10 mg via ORAL
  Filled 2019-03-14 (×5): qty 1

## 2019-03-14 MED ORDER — MORPHINE SULFATE (PF) 4 MG/ML IV SOLN
4.0000 mg | Freq: Once | INTRAVENOUS | Status: AC
  Administered 2019-03-14: 15:00:00 4 mg via INTRAVENOUS
  Filled 2019-03-14: qty 1

## 2019-03-14 MED ORDER — LORAZEPAM 1 MG PO TABS: 1.0000 mg | ORAL_TABLET | Freq: Four times a day (QID) | ORAL | Status: AC | PRN

## 2019-03-14 MED ORDER — VITAMIN B-1 100 MG PO TABS
100.0000 mg | ORAL_TABLET | Freq: Every day | ORAL | Status: DC
  Administered 2019-03-15: 100 mg via ORAL
  Filled 2019-03-14: qty 1

## 2019-03-14 NOTE — H&P (Addendum)
[facility] [place] NAME: Edward Singleton    MR#:  [id number]  DATE OF BIRTH:  1951-04-11  DATE OF ADMISSION:  03/14/2019  PRIMARY CARE PHYSICIAN: Windy Fast, MD   REQUESTING/REFERRING PHYSICIAN:   CHIEF COMPLAINT:   Chief Complaint  Patient presents with  . Fall  . Alcohol Problem    HISTORY OF PRESENT ILLNESS: Edward Singleton  is a 68 y.o. male with a known history of alcohol abuse, prostate hypertrophy, alcoholic dementia, type 2 diabetes mellitus, gout, hyperlipidemia, hypertension, nephrolithiasis presented to the emergency room for frequent falls.  Patient has very frequent falls for the last couple of weeks.  He has abrasion over the forearm dorsal and ventral side.  He was evaluated in the emergency room patient was found to be having low potassium level.  No complaints of any vomiting of blood.  Patient was worked up in the emergency room was found to be anemic to guaiac positive according to ER physician.  X-ray of the left shoulder showed old healed fracture.  CT head no acute abnormality.  X-ray of the left elbow showed hematoma but no fractures.  PAST MEDICAL HISTORY:   Past Medical History:  Diagnosis Date  . Alcohol abuse   . Atrophic kidney   . Benign enlargement of prostate   . Dementia ([place])    due to alcohol  . Diabetes mellitus without complication ([place])   . Gout   . Gout   . Hypercholesteremia   . Hypertension   . Kidney stones   . Left flank pain, chronic   . Pelvic fracture ([place]) 11/17/2014    PAST SURGICAL HISTORY:  Past Surgical History:  Procedure Laterality Date  . left renal stent placement  2013  . LITHOTRIPSY    . ORIF HUMERUS FRACTURE Left 11/18/2014   Procedure: OPEN REDUCTION INTERNAL FIXATION (ORIF) DISTAL HUMERUS FRACTURE;  Surgeon: Rozanna Box, MD;  Location: [facility];  Service: Orthopedics;  Laterality: Left;  . SPLENECTOMY      SOCIAL HISTORY:  Social History   Tobacco Use  .  Smoking status: Current Every Day Smoker    Packs/day: 1.00    Years: 50.00    Pack years: 50.00    Types: Cigarettes  . Smokeless tobacco: Current User    Types: Chew  Substance Use Topics  . Alcohol use: Yes    Alcohol/week: 42.0 standard drinks    Types: 42 Cans of beer per week    FAMILY HISTORY:  Family History  Problem Relation Age of Onset  . Heart disease Mother   . Heart disease Father   . Diabetes Father   . Nephrolithiasis Paternal Grandfather   . Kidney disease Neg Hx   . Prostate cancer Neg Hx     DRUG ALLERGIES: No Known Allergies  REVIEW OF SYSTEMS:   CONSTITUTIONAL: No fever, has fatigue and weakness.  EYES: No blurred or double vision.  EARS, NOSE, AND THROAT: No tinnitus or ear pain.  RESPIRATORY: No cough, shortness of breath, wheezing or hemoptysis.  CARDIOVASCULAR: No chest pain, orthopnea, edema.  GASTROINTESTINAL: No nausea, vomiting, diarrhea or abdominal pain.  GENITOURINARY: No dysuria, hematuria.  ENDOCRINE: No polyuria, nocturia,  HEMATOLOGY: No anemia, easy bruising or bleeding SKIN: Abrasion over left fore arm MUSCULOSKELETAL: No joint pain or arthritis.   NEUROLOGIC: No tingling, numbness, weakness.  PSYCHIATRY: No anxiety or depression.   MEDICATIONS AT HOME:  Prior to Admission medications   Not on File  PHYSICAL EXAMINATION:   VITAL SIGNS: Blood pressure (!) 82/63, pulse 84, temperature 98.1 F (36.7 C), temperature source Oral, resp. rate 13, height 5\' 6"  (1.676 m), weight 72.6 kg, SpO2 97 %.  GENERAL:  67 y.o.-year-old patient lying in the bed with no acute distress.  EYES: Pupils equal, round, reactive to light and accommodation. No scleral icterus. Extraocular muscles intact.  HEENT: Head atraumatic, normocephalic. Oropharynx and nasopharynx clear.  NECK:  Supple, no jugular venous distention. No thyroid enlargement, no tenderness.  LUNGS: Normal breath sounds bilaterally, no wheezing, rales,rhonchi or crepitation. No  use of accessory muscles of respiration.  CARDIOVASCULAR: S1, S2 normal. No murmurs, rubs, or gallops.  ABDOMEN: Soft, nontender, nondistended. Bowel sounds present. No organomegaly or mass.  EXTREMITIES: No pedal edema, cyanosis, or clubbing.  Abrasion over fore arm left side NEUROLOGIC: Cranial nerves II through XII are intact. Muscle strength 5/5 in all extremities. Sensation intact. Gait not checked.  PSYCHIATRIC: The patient is alert and oriented x 3.  SKIN: Abrasion over left fore arm        LABORATORY PANEL:   CBC Recent Labs  Lab 03/14/19 1506  WBC 18.7*  HGB 9.5*  HCT 26.7*  PLT 303  MCV 96.4  MCH 34.3*  MCHC 35.6  RDW 14.6  LYMPHSABS 1.1  MONOABS 1.9*  EOSABS 0.0  BASOSABS 0.1   ------------------------------------------------------------------------------------------------------------------  Chemistries  Recent Labs  Lab 03/14/19 1506  NA 129*  K 2.9*  CL 88*  CO2 16*  GLUCOSE 121*  BUN 94*  CREATININE 5.83*  CALCIUM 9.1  AST 23  ALT 15  ALKPHOS 139*  BILITOT 1.0   ------------------------------------------------------------------------------------------------------------------ estimated creatinine clearance is 11.2 mL/min (A) (by C-G formula based on SCr of 5.83 mg/dL (H)). ------------------------------------------------------------------------------------------------------------------ No results for input(s): TSH, T4TOTAL, T3FREE, THYROIDAB in the last 72 hours.  Invalid input(s): FREET3   Coagulation profile No results for input(s): INR, PROTIME in the last 168 hours. ------------------------------------------------------------------------------------------------------------------- No results for input(s): DDIMER in the last 72 hours. -------------------------------------------------------------------------------------------------------------------  Cardiac Enzymes Recent Labs  Lab 03/14/19 1506  TROPONINI <0.03    ------------------------------------------------------------------------------------------------------------------ Invalid input(s): POCBNP  ---------------------------------------------------------------------------------------------------------------  Urinalysis    Component Value Date/Time   COLORURINE YELLOW (A) 02/20/2019 1957   APPEARANCEUR HAZY (A) 02/20/2019 1957   APPEARANCEUR Clear 05/11/2015 1054   LABSPEC 1.014 02/20/2019 1957   LABSPEC 1.014 01/15/2014 1320   PHURINE 5.0 02/20/2019 1957   GLUCOSEU 150 (A) 02/20/2019 1957   GLUCOSEU Negative 01/15/2014 1320   HGBUR LARGE (A) 02/20/2019 1957   BILIRUBINUR NEGATIVE 02/20/2019 1957   BILIRUBINUR Negative 05/11/2015 1054   BILIRUBINUR Negative 01/15/2014 1320   KETONESUR 20 (A) 02/20/2019 1957   PROTEINUR 100 (A) 02/20/2019 1957   UROBILINOGEN 1.0 11/29/2014 1534   NITRITE NEGATIVE 02/20/2019 1957   LEUKOCYTESUR NEGATIVE 02/20/2019 1957   LEUKOCYTESUR Negative 01/15/2014 1320     RADIOLOGY: Dg Elbow Complete Left  Result Date: 03/14/2019 CLINICAL DATA:  Left elbow pain after fall. EXAM: LEFT ELBOW - COMPLETE 3+ VIEW COMPARISON:  Radiographs of November 18, 2014. FINDINGS: Status post surgical internal fixation of old distal left humeral fracture. Heterotopic bone formation is noted. Degenerative changes are seen involving the joint spaces between the humerus and radius and ulna. No acute fracture or dislocation is noted. Probable soft tissue hematoma is noted overlying the proximal ulna. IMPRESSION: Extensive postsurgical and degenerative changes are noted as described above. Probable soft tissue hematoma seen overlying proximal ulna. No acute fracture or dislocation is noted. Electronically Signed  By: Marijo Conception M.D.   On: 03/14/2019 15:17   Ct Head Wo Contrast  Result Date: 03/14/2019 CLINICAL DATA:  Posttraumatic headache and confusion after fall at home. EXAM: CT HEAD WITHOUT CONTRAST CT CERVICAL SPINE WITHOUT  CONTRAST TECHNIQUE: Multidetector CT imaging of the head and cervical spine was performed following the standard protocol without intravenous contrast. Multiplanar CT image reconstructions of the cervical spine were also generated. COMPARISON:  CT scan of Feb 20, 2019. FINDINGS: CT HEAD FINDINGS Brain: Mild diffuse cortical atrophy is noted. Mild chronic ischemic white matter disease is noted. 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 No mass effect or midline shift is noted. Ventricular size is within normal limits. There is no evidence of mass lesion, hemorrhage or acute infarction. Vascular: No hyperdense vessel or unexpected calcification. Skull: Normal. Negative for fracture or focal lesion. Sinuses/Orbits: No acute finding. Other: None. CT CERVICAL SPINE FINDINGS Alignment: Grade 1 anterolisthesis of C4-5 is noted secondary to posterior facet joint hypertrophy. Skull base and vertebrae: No acute fracture. No primary bone lesion or focal pathologic process. Soft tissues and spinal canal: No prevertebral fluid or swelling. No visible canal hematoma. Disc levels: Severe degenerative disc disease is noted at C5-6 and C6-7 with anterior posterior osteophyte formation. Upper chest: Negative. Other: Degenerative changes are seen involving posterior facet joints bilaterally. IMPRESSION: Mild diffuse cortical atrophy. Mild chronic ischemic white matter disease. No acute intracranial abnormality seen. Multilevel degenerative disc disease. No acute abnormality seen in the cervical spine. Electronically Signed   By: Marijo Conception M.D.   On: 03/14/2019 15:32   Ct Cervical Spine Wo Contrast  Result Date: 03/14/2019 CLINICAL DATA:  Posttraumatic headache and confusion after fall at home. EXAM: CT HEAD WITHOUT CONTRAST CT CERVICAL SPINE WITHOUT CONTRAST TECHNIQUE: Multidetector CT imaging of the head and cervical spine was performed following the standard protocol without intravenous  contrast. Multiplanar CT image reconstructions of the cervical spine were also generated. COMPARISON:  CT scan of Feb 20, 2019. FINDINGS: CT HEAD FINDINGS Brain: Mild diffuse cortical atrophy is noted. Mild chronic ischemic white matter disease is noted. 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 No mass effect or midline shift is noted. Ventricular size is within normal limits. There is no evidence of mass lesion, hemorrhage or acute infarction. Vascular: No hyperdense vessel or unexpected calcification. Skull: Normal. Negative for fracture or focal lesion. Sinuses/Orbits: No acute finding. Other: None. CT CERVICAL SPINE FINDINGS Alignment: Grade 1 anterolisthesis of C4-5 is noted secondary to posterior facet joint hypertrophy. Skull base and vertebrae: No acute fracture. No primary bone lesion or focal pathologic process. Soft tissues and spinal canal: No prevertebral fluid or swelling. No visible canal hematoma. Disc levels: Severe degenerative disc disease is noted at C5-6 and C6-7 with anterior posterior osteophyte formation. Upper chest: Negative. Other: Degenerative changes are seen involving posterior facet joints bilaterally. IMPRESSION: Mild diffuse cortical atrophy. Mild chronic ischemic white matter disease. No acute intracranial abnormality seen. Multilevel degenerative disc disease. No acute abnormality seen in the cervical spine. Electronically Signed   By: Marijo Conception M.D.   On: 03/14/2019 15:32   Dg Shoulder Left  Result Date: 03/14/2019 CLINICAL DATA:  Fall.  Pain. EXAM: LEFT SHOULDER - 2+ VIEW COMPARISON:  CT 09/05/2018. FINDINGS: Acromioclavicular glenohumeral degenerative change. Old non unified fracture of the distal clavicle noted. Angulated old non healed fracture proximal left humerus. Proximal humerus consistent with non healed subcapital fracture. Angulated subcapital fracture  of the left proximal humerus is noted on prior CT of 09/05/2018. Plate  screw fixation distal humerus. No new abnormality identified. Negative. IMPRESSION: 1. Old non unified fracture left distal clavicle. Angulated old non healed fracture left proximal humerus. 2.  No acute abnormality. Electronically Signed   By: Marcello Moores  Register   On: 03/14/2019 15:17    EKG: Orders placed or performed during the hospital encounter of 03/14/19  . EKG 12-Lead  . EKG 12-Lead    IMPRESSION AND PLAN: 68 year old male patient with a known history of alcohol abuse, prostate hypertrophy, alcoholic dementia, type 2 diabetes mellitus, gout, hyperlipidemia, hypertension, nephrolithiasis presented to the emergency room for frequent falls.    -Acute hypokalemia Replace potassium orally and intravenously Admit patient to telemetry Check magnesium level  -Anemia Could be secondary to chronic alcoholism Serial hemoglobin hematocrit monitoring Occult blood was positive in the emergency room Patient does not complain of any vomiting of blood or melanotic stools Will start IV Protonix 40 mg 12 hourly GI consult  -Frequent falls and gait instability Secondary to alcoholic encephalopathy and dementia Physical therapy evaluation once clinically stable gait and balance training Will assess for Wernicke's encephalopathy  -Alcohol abuse CIWA protocol Thiamine folic acid supplements  -Type 2 diabetes mellitus Sliding scale coverage with insulin  -Hyponatremia IV fluid hydration  -Leukocytosis Stress-induced Follow-up WBC count  -Alcoholic ketoacidosis Aggressive IV fluid hydration Follow-up anion gap  -Tobacco abuse Tobacco cessation counseled to the patient for 6 minutes Nicotine patch offered  -DVT prophylaxis sequential compression device to lower extremities  All the records are reviewed and case discussed with ED provider. Management plans discussed with the patient, family and they are in agreement.  CODE STATUS:Full code Code Status History    Date Active Date  Inactive Code Status Order ID Comments User Context   02/21/2019 0321 02/23/2019 1936 Full Code [id number]  Lance Coon, MD Inpatient   02/21/2019 0045 02/21/2019 0320 Full Code [id number]  Gregor Hams, MD ED   07/23/2015 0231 07/27/2015 2114 Full Code [id number]  Fritzi Mandes, MD Inpatient   11/25/2014 1510 12/08/2014 1650 Full Code [id number]  Cathlyn Parsons, PA-C Inpatient   11/25/2014 1510 11/25/2014 1510 Full Code [id number]  Cathlyn Parsons, PA-C Inpatient   11/17/2014 1518 11/25/2014 1510 Full Code [id number]  Lisette Abu, PA-C Inpatient    Advance Directive Documentation     Most Recent Value  Type of Advance Directive  Healthcare Power of Attorney  Pre-existing out of facility DNR order (yellow form or pink MOST form)  -  "MOST" Form in Place?  -      TOTAL TIME TAKING CARE OF THIS PATIENT: 52 minutes.    Saundra Shelling M.D on 03/14/2019 at 5:44 PM  Between 7am to 6pm - Pager - [phone]  After 6pm go to [email or web address] - password [email or web address] [facility]  [facility]  Office  [phone]  CC: Primary care physician; Windy Fast, MD

## 2019-03-14 NOTE — ED Provider Notes (Signed)
[facility] Emergency Department Provider Note  ____________________________________________   None    (approximate)  I have reviewed the triage vital signs and the nursing notes.   HISTORY  Chief Complaint Fall and Alcohol Problem    HPI Edward Singleton is a 68 y.o. male presents emergency department after being found in the hallway of his home.  States he fell last night and just slipped in the hallway.  However he was able to get himself up and cleaned prior to arrival.  He arrived via EMS.  APS is seeking guardianship of the patient.  He is unsure if he had a LOC due to drinking alcohol.  He is complaining of left shoulder and left arm pain.  Denies chest pain or shortness of breath.    Past Medical History:  Diagnosis Date  . Alcohol abuse   . Atrophic kidney   . Benign enlargement of prostate   . Dementia ([place])    due to alcohol  . Diabetes mellitus without complication ([place])   . Gout   . Gout   . Hypercholesteremia   . Hypertension   . Kidney stones   . Left flank pain, chronic   . Pelvic fracture ([place]) 11/17/2014    Patient Active Problem List   Diagnosis Date Noted  . Alcoholic ketoacidosis 79/39/0300  . Alcohol withdrawal syndrome with complication ([place]) 92/33/0076  . Diabetes ([place]) 02/21/2019  . Left rotator cuff tear arthropathy 12/28/2015  . Hyperlipidemia 12/24/2015  . Tobacco abuse 11/06/2015  . Mood disturbance 11/06/2015  . CAD (coronary artery disease) 10/16/2015  . PAD (peripheral artery disease) ([place]) 10/16/2015  . Preventative health care 08/13/2015  . Renal artery stenosis ([place]) 07/30/2015  . Acute on chronic renal failure ([place]) 07/30/2015  . Kidney atrophy 07/30/2015  . CKD (chronic kidney disease), stage III ([place]) 07/30/2015  . Insomnia 07/22/2015  . History of substance abuse ([place]) 07/15/2015  . BPH (benign prostatic hyperplasia) 07/15/2015  . Gout 07/15/2015  . Kidney stones 05/11/2015  . 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 HTN (hypertension) 11/17/2014  . Alcohol abuse 11/17/2014    Past Surgical History:  Procedure Laterality Date  . left renal stent placement  2013  . LITHOTRIPSY    . ORIF HUMERUS FRACTURE Left 11/18/2014   Procedure: OPEN REDUCTION INTERNAL FIXATION (ORIF) DISTAL HUMERUS FRACTURE;  Surgeon: Rozanna Box, MD;  Location: [facility];  Service: Orthopedics;  Laterality: Left;  . SPLENECTOMY      Prior to Admission medications   Not on File    Allergies Patient has no known allergies.  Family History  Problem Relation Age of Onset  . Heart disease Mother   . Heart disease Father   . Diabetes Father   . Nephrolithiasis Paternal Grandfather   . Kidney disease Neg Hx   . Prostate cancer Neg Hx     Social History Social History   Tobacco Use  . Smoking status: Current Every Day Smoker    Packs/day: 1.00    Years: 50.00    Pack years: 50.00    Types: Cigarettes  . Smokeless tobacco: Current User    Types: Chew  Substance Use Topics  . Alcohol use: Yes    Alcohol/week: 42.0 standard drinks    Types: 42 Cans of beer per week  . Drug use: No    Review of Systems  Constitutional: No fever/chills, questionable LOC Eyes: No visual changes. ENT: No sore throat. Respiratory: Denies cough Genitourinary:  Negative for dysuria. Musculoskeletal: Negative for back pain.  Positive for left shoulder and left elbow pain Skin: Negative for rash.  Multiple skin tears    ____________________________________________   PHYSICAL EXAM:  VITAL SIGNS: ED Triage Vitals  Enc Vitals Group     BP 03/14/19 1446 97/71     Pulse Rate 03/14/19 1446 80     Resp 03/14/19 1446 18     Temp 03/14/19 1446 98.1 F (36.7 C)     Temp Source 03/14/19 1446 Oral     SpO2 03/14/19 1446 99 %     Weight 03/14/19 1447 160 lb (72.6 kg)     Height 03/14/19 1447 5\' 6"  (1.676 m)     Head Circumference --      Peak Flow --      Pain Score 03/14/19 1446 10     Pain Loc --       Pain Edu? --      Excl. in [place]? --     Constitutional: Alert and oriented. Well appearing and in no acute distress. Eyes: Conjunctivae are normal.  Head: Atraumatic. Nose: No congestion/rhinnorhea. Mouth/Throat: Mucous membranes are moist.   Neck:  supple no lymphadenopathy noted Cardiovascular: Normal rate, regular rhythm. Heart sounds are normal Respiratory: Normal respiratory effort.  No retractions, lungs c t a  Abd: soft nontender bs normal all 4 quad Rectal exam: Positive guaiac test, stool is dark and tarry looking GU: deferred Musculoskeletal: Left shoulder and left elbow are tender, clavicle is tender, neurovascular is intact, multiple bruises of different healing stages noted, multiple abrasions noted neurologic:  Normal speech and language.  Skin:  Skin is warm, dry, multiple abrasions and skin tears. No rash noted. Psychiatric: Mood and affect are normal. Speech and behavior are normal.  ____________________________________________   LABS (all labs ordered are listed, but only abnormal results are displayed)  Labs Reviewed  COMPREHENSIVE METABOLIC PANEL - Abnormal; Notable for the following components:      Result Value   Sodium 129 (*)    Potassium 2.9 (*)    Chloride 88 (*)    CO2 16 (*)    Glucose, Bld 121 (*)    BUN 94 (*)    Creatinine, Ser 5.83 (*)    Alkaline Phosphatase 139 (*)    GFR calc non Af Amer 9 (*)    GFR calc Af Amer 11 (*)    Anion gap 25 (*)    All other components within normal limits  ETHANOL - Abnormal; Notable for the following components:   Alcohol, Ethyl (B) 18 (*)    All other components within normal limits  CBC WITH DIFFERENTIAL/PLATELET - Abnormal; Notable for the following components:   WBC 18.7 (*)    RBC 2.77 (*)    Hemoglobin 9.5 (*)    HCT 26.7 (*)    MCH 34.3 (*)    Neutro Abs 15.4 (*)    Monocytes Absolute 1.9 (*)    Abs Immature Granulocytes 0.19 (*)    All other components within normal limits  TROPONIN I  CK    ____________________________________________   ____________________________________________  RADIOLOGY  X-ray of the left shoulder and left elbow neg for new fx CT of the head and C-spine neg for acute abnormality  ____________________________________________   PROCEDURES  Procedure(s) performed: Saline lock, morphine 4 mg IV, Zofran 4 mg IV   Procedures     ____________________________________________   INITIAL IMPRESSION / ASSESSMENT AND PLAN / ED COURSE  Pertinent labs &  imaging results that were available during my care of the patient were reviewed by me and considered in my medical decision making (see chart for details).   Patient is 68 year old male presents emergency department after being found in his hallway at home.  He was drinking alcohol last night and states he fell.  However he was able to ambulate to the ambulance without difficulty.  Physical exam shows there to be dried blood on the hair and scalp with no wound noted, multiple skin tears noted on the arms, tenderness at the left clavicle/left shoulder and left elbow.  C-spine is minimally tender  DDX: Syncope, alcohol intoxication, left shoulder fracture, left elbow fracture, failure to thrive  CBC, comprehensive metabolic panel, troponin, EtOH, and CK were ordered  X-ray of the left shoulder and left elbow, CT of the head and C-spine  ----------------------------------------- 4:45 PM on 03/14/2019 -----------------------------------------  EtOH level is 18, comprehensive metabolic panel shows decreased potassium at 2.9 BUN increased to 94 creatinine 5.8 CBC has elevated WBC of 18.7, H&H are decreased  Patient vomited in the room and the emesis was dark and coke colored.  Concerns of GI bleed were expressed by the APS.  Guaiac test is positive  Due to the patient's potassium being low along with GI bleed, IV potassium ordered  ----------------------------------------- 4:49 PM on 03/14/2019  ----------------------------------------- Paged [facility] They will admit patient for observation   As part of my medical decision making, I reviewed the following data within the [facility] notes reviewed and incorporated, Labs reviewed see above, EKG interpreted NSR, Old chart reviewed, Radiograph reviewed x-rays show old fractures nothing new, Discussed with admitting physician hospitalist, Evaluated by EM attending Archie Balboa, Notes from prior ED visits and [place] Controlled Substance Database  ____________________________________________   FINAL CLINICAL IMPRESSION(S) / ED DIAGNOSES  Final diagnoses:  Acute upper GI bleed  Fall, initial encounter  ETOH abuse  Multiple abrasions      NEW MEDICATIONS STARTED DURING THIS VISIT:  New Prescriptions   No medications on file     Note:  This document was prepared using Dragon voice recognition software and may include unintentional dictation errors.    Versie Starks, PA-C 03/14/19 1653    Nance Pear, MD 03/14/19 [phone]

## 2019-03-14 NOTE — ED Notes (Signed)
ED TO INPATIENT HANDOFF REPORT  ED Nurse Name and Phone #: [phone]  S Name/Age/Gender Edward Singleton 68 y.o. male Room/Bed: ED15A/ED15A  Code Status   Code Status: Prior  Home/SNF/Other Home Patient oriented to: self, place, time and situation Is this baseline? Yes   Triage Complete: Triage complete  Chief Complaint fall/ems  Triage Note PT to ED via EMS from home. PT has had multiple falls this week per family. Family states pt slept in hallway. Family called APS, who is seeking guardianship currently. Pt agreed to come to hospital. PT agrees to ETOH chronic use. C/o left shoulder pain.    Allergies No Known Allergies  Level of Care/Admitting Diagnosis ED Disposition    ED Disposition Condition [facility]: [place] [[id number]]  Level of Care: Telemetry [5]  Covid Evaluation: N/A  Diagnosis: Hypokalemia [324401]  Admitting Physician: Saundra Shelling [[id number]]  Attending Physician: Saundra Shelling [[id number]]  Estimated length of stay: past midnight tomorrow  Certification:: I certify this patient will need inpatient services for at least 2 midnights  PT Class (Do Not Modify): Inpatient [101]  PT Acc Code (Do Not Modify): Private [1]       B Medical/Surgery History Past Medical History:  Diagnosis Date  . Alcohol abuse   . Atrophic kidney   . Benign enlargement of prostate   . 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 Diabetes mellitus without complication ([place])   . Gout   . Gout   . Hypercholesteremia   . Hypertension   . Kidney stones   . Left flank pain, chronic   . Pelvic fracture ([place]) 11/17/2014   Past Surgical History:  Procedure Laterality Date  . left renal stent placement  2013  . LITHOTRIPSY    . ORIF HUMERUS FRACTURE Left 11/18/2014   Procedure: OPEN REDUCTION INTERNAL FIXATION (ORIF) DISTAL HUMERUS FRACTURE;  Surgeon: Rozanna Box, MD;  Location: [place];  Service: Orthopedics;  Laterality: Left;  .  SPLENECTOMY       A IV Location/Drains/Wounds Patient Lines/Drains/Airways Status   Active Line/Drains/Airways    Name:   Placement date:   Placement time:   Site:   Days:   Peripheral IV 02/20/19 Left Antecubital   02/20/19    1915    Antecubital   22   Peripheral IV 02/21/19 Right Antecubital   02/21/19    0100    Antecubital   21   Peripheral IV 03/14/19 Right Forearm   03/14/19    1508    Forearm   less than 1   Urethral Catheter Felicia Preudhomme RN Double-lumen 16 Fr.   02/22/19    2026    Double-lumen   20   Incision (Closed) 11/18/14 Arm Left   11/18/14    1622     1577          Intake/Output Last 24 hours No intake or output data in the 24 hours ending 03/14/19 1718  Labs/Imaging Results for orders placed or performed during the hospital encounter of 03/14/19 (from the past 48 hour(s))  Comprehensive metabolic panel     Status: Abnormal   Collection Time: 03/14/19  3:06 PM  Result Value Ref Range   Sodium 129 (L) 135 - 145 mmol/L    Comment: LYTES REPEATED  MLK   Potassium 2.9 (L) 3.5 - 5.1 mmol/L   Chloride 88 (L) 98 - 111 mmol/L   CO2 16 (L) 22 -  32 mmol/L   Glucose, Bld 121 (H) 70 - 99 mg/dL   BUN 94 (H) 8 - 23 mg/dL   Creatinine, Ser 5.83 (H) 0.61 - 1.24 mg/dL   Calcium 9.1 8.9 - 10.3 mg/dL   Total Protein 7.4 6.5 - 8.1 g/dL   Albumin 4.2 3.5 - 5.0 g/dL   AST 23 15 - 41 U/L   ALT 15 0 - 44 U/L   Alkaline Phosphatase 139 (H) 38 - 126 U/L   Total Bilirubin 1.0 0.3 - 1.2 mg/dL   GFR calc non Af Amer 9 (L) >60 mL/min   GFR calc Af Amer 11 (L) >60 mL/min   Anion gap 25 (H) 5 - 15    Comment: Performed at [facility], [address] [place], [place] [address]  Ethanol     Status: Abnormal   Collection Time: 03/14/19  3:06 PM  Result Value Ref Range   Alcohol, Ethyl (B) 18 (H) <10 mg/dL    Comment: (NOTE) Lowest detectable limit for serum alcohol is 10 mg/dL. For medical purposes only. Performed at [facility], [address]  [place], [place] [address]   Troponin I - Once     Status: None   Collection Time: 03/14/19  3:06 PM  Result Value Ref Range   Troponin I <0.03 <0.03 ng/mL    Comment: Performed at [facility], [address] [place], [place] [address]  CBC with Differential     Status: Abnormal   Collection Time: 03/14/19  3:06 PM  Result Value Ref Range   WBC 18.7 (H) 4.0 - 10.5 K/uL   RBC 2.77 (L) 4.22 - 5.81 MIL/uL   Hemoglobin 9.5 (L) 13.0 - 17.0 g/dL   HCT 26.7 (L) 39.0 - 52.0 %   MCV 96.4 80.0 - 100.0 fL   MCH 34.3 (H) 26.0 - 34.0 pg   MCHC 35.6 30.0 - 36.0 g/dL   RDW 14.6 11.5 - 15.5 %   Platelets 303 150 - 400 K/uL   nRBC 0.2 0.0 - 0.2 %   Neutrophils Relative % 83 %   Neutro Abs 15.4 (H) 1.7 - 7.7 K/uL   Lymphocytes Relative 6 %   Lymphs Abs 1.1 0.7 - 4.0 K/uL   Monocytes Relative 10 %   Monocytes Absolute 1.9 (H) 0.1 - 1.0 K/uL   Eosinophils Relative 0 %   Eosinophils Absolute 0.0 0.0 - 0.5 K/uL   Basophils Relative 0 %   Basophils Absolute 0.1 0.0 - 0.1 K/uL   Immature Granulocytes 1 %   Abs Immature Granulocytes 0.19 (H) 0.00 - 0.07 K/uL    Comment: Performed at [facility], [address] [place], [place] [address]  CK     Status: None   Collection Time: 03/14/19  3:06 PM  Result Value Ref Range   Total CK 189 49 - 397 U/L    Comment: Performed at [facility], [address] [place],  [address]   Dg Elbow Complete Left  Result Date: 03/14/2019 CLINICAL DATA:  Left elbow pain after fall. EXAM: LEFT ELBOW - COMPLETE 3+ VIEW COMPARISON:  Radiographs of November 18, 2014. FINDINGS: Status post surgical internal fixation of old distal left humeral fracture. Heterotopic bone formation is noted. Degenerative changes are seen involving the joint spaces between the humerus and radius and ulna. No acute fracture or dislocation is noted. Probable soft tissue hematoma is noted overlying the proximal ulna. IMPRESSION: Extensive postsurgical and  degenerative changes are  noted as described above. Probable soft tissue hematoma seen overlying proximal ulna. No acute fracture or dislocation is noted. Electronically Signed   By: Marijo Conception M.D.   On: 03/14/2019 15:17   Ct Head Wo Contrast  Result Date: 03/14/2019 CLINICAL DATA:  Posttraumatic headache and confusion after fall at home. EXAM: CT HEAD WITHOUT CONTRAST CT CERVICAL SPINE WITHOUT CONTRAST TECHNIQUE: Multidetector CT imaging of the head and cervical spine was performed following the standard protocol without intravenous contrast. Multiplanar CT image reconstructions of the cervical spine were also generated. COMPARISON:  CT scan of Feb 20, 2019. FINDINGS: CT HEAD FINDINGS Brain: Mild diffuse cortical atrophy is noted. Mild chronic ischemic white matter disease is noted. 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 No mass effect or midline shift is noted. Ventricular size is within normal limits. There is no evidence of mass lesion, hemorrhage or acute infarction. Vascular: No hyperdense vessel or unexpected calcification. Skull: Normal. Negative for fracture or focal lesion. Sinuses/Orbits: No acute finding. Other: None. CT CERVICAL SPINE FINDINGS Alignment: Grade 1 anterolisthesis of C4-5 is noted secondary to posterior facet joint hypertrophy. Skull base and vertebrae: No acute fracture. No primary bone lesion or focal pathologic process. Soft tissues and spinal canal: No prevertebral fluid or swelling. No visible canal hematoma. Disc levels: Severe degenerative disc disease is noted at C5-6 and C6-7 with anterior posterior osteophyte formation. Upper chest: Negative. Other: Degenerative changes are seen involving posterior facet joints bilaterally. IMPRESSION: Mild diffuse cortical atrophy. Mild chronic ischemic white matter disease. No acute intracranial abnormality seen. Multilevel degenerative disc disease. No acute abnormality seen in the cervical  spine. Electronically Signed   By: Marijo Conception M.D.   On: 03/14/2019 15:32   Ct Cervical Spine Wo Contrast  Result Date: 03/14/2019 CLINICAL DATA:  Posttraumatic headache and confusion after fall at home. EXAM: CT HEAD WITHOUT CONTRAST CT CERVICAL SPINE WITHOUT CONTRAST TECHNIQUE: Multidetector CT imaging of the head and cervical spine was performed following the standard protocol without intravenous contrast. Multiplanar CT image reconstructions of the cervical spine were also generated. COMPARISON:  CT scan of Feb 20, 2019. FINDINGS: CT HEAD FINDINGS Brain: Mild diffuse cortical atrophy is noted. Mild chronic ischemic white matter disease is noted. 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 No mass effect or midline shift is noted. Ventricular size is within normal limits. There is no evidence of mass lesion, hemorrhage or acute infarction. Vascular: No hyperdense vessel or unexpected calcification. Skull: Normal. Negative for fracture or focal lesion. Sinuses/Orbits: No acute finding. Other: None. CT CERVICAL SPINE FINDINGS Alignment: Grade 1 anterolisthesis of C4-5 is noted secondary to posterior facet joint hypertrophy. Skull base and vertebrae: No acute fracture. No primary bone lesion or focal pathologic process. Soft tissues and spinal canal: No prevertebral fluid or swelling. No visible canal hematoma. Disc levels: Severe degenerative disc disease is noted at C5-6 and C6-7 with anterior posterior osteophyte formation. Upper chest: Negative. Other: Degenerative changes are seen involving posterior facet joints bilaterally. IMPRESSION: Mild diffuse cortical atrophy. Mild chronic ischemic white matter disease. No acute intracranial abnormality seen. Multilevel degenerative disc disease. No acute abnormality seen in the cervical spine. Electronically Signed   By: Marijo Conception M.D.   On: 03/14/2019 15:32   Dg Shoulder Left  Result Date: 03/14/2019 CLINICAL DATA:   Fall.  Pain. EXAM: LEFT SHOULDER - 2+ VIEW COMPARISON:  CT 09/05/2018. FINDINGS: Acromioclavicular glenohumeral degenerative change. Old non unified fracture of  the distal clavicle noted. Angulated old non healed fracture proximal left humerus. Proximal humerus consistent with non healed subcapital fracture. Angulated subcapital fracture of the left proximal humerus is noted on prior CT of 09/05/2018. Plate screw fixation distal humerus. No new abnormality identified. Negative. IMPRESSION: 1. Old non unified fracture left distal clavicle. Angulated old non healed fracture left proximal humerus. 2.  No acute abnormality. Electronically Signed   By: Marcello Moores  Register   On: 03/14/2019 15:17    Pending Labs Unresulted Labs (From admission, onward)    Start     Ordered   03/15/19 0500  CBC  Tomorrow morning,   STAT     03/14/19 1705   03/14/19 2200  Hemoglobin and hematocrit, blood  Once,   STAT     03/14/19 1705   Signed and Held  Basic metabolic panel  Tomorrow morning,   R     Signed and Held          Vitals/Pain Today's Vitals   03/14/19 1446 03/14/19 1447 03/14/19 1709 03/14/19 1716  BP: 97/71  (!) 82/63   Pulse: 80  84   Resp: 18  13   Temp: 98.1 F (36.7 C)     TempSrc: Oral     SpO2: 99%  97%   Weight:  72.6 kg    Height:  5\' 6"  (1.676 m)    PainSc: 10-Worst pain ever   10-Worst pain ever    Isolation Precautions No active isolations  Medications Medications  potassium chloride 10 mEq in 100 mL IVPB (has no administration in time range)  pantoprazole (PROTONIX) injection 40 mg (has no administration in time range)  thiamine (VITAMIN B-1) tablet 100 mg (has no administration in time range)  folic acid (FOLVITE) tablet 1 mg (has no administration in time range)  potassium chloride SA (K-DUR) CR tablet 40 mEq (has no administration in time range)  0.9 % NaCl with KCl 20 mEq/ L  infusion (has no administration in time range)  LORazepam (ATIVAN) tablet 1 mg (has no  administration in time range)    Or  LORazepam (ATIVAN) injection 1 mg (has no administration in time range)  multivitamin with minerals tablet 1 tablet (has no administration in time range)  LORazepam (ATIVAN) injection 0-4 mg (has no administration in time range)    Followed by  LORazepam (ATIVAN) injection 0-4 mg (has no administration in time range)  morphine 4 MG/ML injection 4 mg (4 mg Intravenous Given 03/14/19 1528)  ondansetron (ZOFRAN) injection 4 mg (4 mg Intravenous Given 03/14/19 1528)    Mobility Has been walking, fall risk High fall risk   Focused Assessments Cardiac Assessment Handoff:    Lab Results  Component Value Date   CKTOTAL 189 03/14/2019   CKMB 1.6 01/15/2014   TROPONINI <0.03 03/14/2019   No results found for: DDIMER Does the Patient currently have chest pain? No     R Recommendations: See Admitting Provider Note  Report given to:   Additional Notes: etoh 18

## 2019-03-14 NOTE — Progress Notes (Addendum)
Pt was admitted in th floor without any signs of distress. VSS except BP of 84/65. Prime Notified and talked to [facility] NP and order a bolus of 500 cc. Pt call bell and telephone within  reach. Pt was oriented on the floor. Will continue to monitor.

## 2019-03-14 NOTE — Progress Notes (Signed)
Advanced care plan. Purpose of the Encounter: CODE STATUS Parties in Attendance: Patient Patient's Decision Capacity: Good Subjective/Patient's story: Edward Singleton  is a 68 y.o. male with a known history of alcohol abuse, prostate hypertrophy, alcoholic dementia, type 2 diabetes mellitus, gout, hyperlipidemia, hypertension, nephrolithiasis presented to the emergency room for frequent falls.  Patient has very frequent falls for the last couple of weeks.  He has abrasion over the forearm dorsal and ventral side.  He was evaluated in the emergency room patient was found to be having low potassium level.  No complaints of any vomiting of blood.  Patient was worked up in the emergency room was found to be anemic to guaiac positive according to ER physician.  X-ray of the left shoulder showed old healed fracture.  CT head no acute abnormality.  X-ray of the left elbow showed hematoma but no fractures Objective/Medical story Patient needs potassium supplementation and IV fluids.  Needs physical therapy evaluation.  Needs CIWA protocol Goals of care determination:  Advance care directives goals of care and treatment plan discussed Patient for now is full resuscitation CODE STATUS: Full code Time spent discussing advanced care planning: 16 minutes

## 2019-03-14 NOTE — ED Notes (Signed)
Sister of pt updated

## 2019-03-14 NOTE — ED Notes (Signed)
Attempted to update sister

## 2019-03-14 NOTE — ED Triage Notes (Signed)
PT to ED via EMS from home. PT has had multiple falls this week per family. Family states pt slept in hallway. Family called APS, who is seeking guardianship currently. Pt agreed to come to hospital. PT agrees to ETOH chronic use. C/o left shoulder pain.

## 2019-03-14 NOTE — ED Notes (Signed)
Patient transported to CT 

## 2019-03-15 ENCOUNTER — Inpatient Hospital Stay: Payer: Medicare Other

## 2019-03-15 DIAGNOSIS — F1010 Alcohol abuse, uncomplicated: Secondary | ICD-10-CM

## 2019-03-15 LAB — RETICULOCYTES
Immature Retic Fract: 31.1 % — ABNORMAL HIGH (ref 2.3–15.9)
RBC.: 1.98 MIL/uL — ABNORMAL LOW (ref 4.22–5.81)
Retic Count, Absolute: 83.2 10*3/uL (ref 19.0–186.0)
Retic Ct Pct: 4.2 % — ABNORMAL HIGH (ref 0.4–3.1)

## 2019-03-15 LAB — CBC
HCT: 20.6 % — ABNORMAL LOW (ref 39.0–52.0)
Hemoglobin: 7.1 g/dL — ABNORMAL LOW (ref 13.0–17.0)
MCH: 34 pg (ref 26.0–34.0)
MCHC: 34.5 g/dL (ref 30.0–36.0)
MCV: 98.6 fL (ref 80.0–100.0)
Platelets: 232 10*3/uL (ref 150–400)
RBC: 2.09 MIL/uL — ABNORMAL LOW (ref 4.22–5.81)
RDW: 15 % (ref 11.5–15.5)
WBC: 14.9 10*3/uL — ABNORMAL HIGH (ref 4.0–10.5)
nRBC: 0.2 % (ref 0.0–0.2)

## 2019-03-15 LAB — FERRITIN: Ferritin: 135 ng/mL (ref 24–336)

## 2019-03-15 LAB — BASIC METABOLIC PANEL
Anion gap: 16 — ABNORMAL HIGH (ref 5–15)
BUN: 90 mg/dL — ABNORMAL HIGH (ref 8–23)
CO2: 17 mmol/L — ABNORMAL LOW (ref 22–32)
Calcium: 7.9 mg/dL — ABNORMAL LOW (ref 8.9–10.3)
Chloride: 97 mmol/L — ABNORMAL LOW (ref 98–111)
Creatinine, Ser: 4.89 mg/dL — ABNORMAL HIGH (ref 0.61–1.24)
GFR calc Af Amer: 13 mL/min — ABNORMAL LOW (ref >60)
GFR calc non Af Amer: 11 mL/min — ABNORMAL LOW (ref >60)
Glucose, Bld: 148 mg/dL — ABNORMAL HIGH (ref 70–99)
Potassium: 3 mmol/L — ABNORMAL LOW (ref 3.5–5.1)
Sodium: 130 mmol/L — ABNORMAL LOW (ref 135–145)

## 2019-03-15 LAB — MAGNESIUM: Magnesium: 1.7 mg/dL (ref 1.7–2.4)

## 2019-03-15 LAB — HEMOGLOBIN AND HEMATOCRIT, BLOOD
HCT: 24.3 % — ABNORMAL LOW (ref 39.0–52.0)
Hemoglobin: 8 g/dL — ABNORMAL LOW (ref 13.0–17.0)

## 2019-03-15 LAB — IRON AND TIBC
Iron: 34 ug/dL — ABNORMAL LOW (ref 45–182)
Saturation Ratios: 13 % — ABNORMAL LOW (ref 17.9–39.5)
TIBC: 262 ug/dL (ref 250–450)
UIBC: 228 ug/dL

## 2019-03-15 LAB — FOLATE: Folate: 41 ng/mL (ref >5.9)

## 2019-03-15 LAB — PREPARE RBC (CROSSMATCH)

## 2019-03-15 LAB — VITAMIN B12: Vitamin B-12: 163 pg/mL — ABNORMAL LOW (ref 180–914)

## 2019-03-15 MED ORDER — MIDODRINE HCL 5 MG PO TABS
10.0000 mg | ORAL_TABLET | Freq: Three times a day (TID) | ORAL | Status: DC
  Administered 2019-03-15 – 2019-03-19 (×14): 10 mg via ORAL
  Filled 2019-03-15 (×15): qty 2

## 2019-03-15 MED ORDER — MAGNESIUM SULFATE 2 GM/50ML IV SOLN
2.0000 g | Freq: Once | INTRAVENOUS | Status: AC
  Administered 2019-03-15: 2 g via INTRAVENOUS
  Filled 2019-03-15: qty 50

## 2019-03-15 MED ORDER — POTASSIUM CHLORIDE CRYS ER 20 MEQ PO TBCR: 40.0000 meq | EXTENDED_RELEASE_TABLET | Freq: Once | ORAL | Status: DC

## 2019-03-15 MED ORDER — CALCIUM GLUCONATE-NACL 1-0.675 GM/50ML-% IV SOLN
1.0000 g | Freq: Once | INTRAVENOUS | Status: AC
  Administered 2019-03-15: 1000 mg via INTRAVENOUS
  Filled 2019-03-15: qty 50

## 2019-03-15 MED ORDER — POTASSIUM CHLORIDE CRYS ER 20 MEQ PO TBCR
40.0000 meq | EXTENDED_RELEASE_TABLET | ORAL | Status: AC
  Administered 2019-03-15 (×2): 40 meq via ORAL
  Filled 2019-03-15 (×2): qty 2

## 2019-03-15 MED ORDER — CYANOCOBALAMIN 1000 MCG/ML IJ SOLN
1000.0000 ug | Freq: Every day | INTRAMUSCULAR | Status: AC
  Administered 2019-03-15 – 2019-03-17 (×3): 1000 ug via SUBCUTANEOUS
  Filled 2019-03-15 (×3): qty 1

## 2019-03-15 MED ORDER — SODIUM CHLORIDE 0.9 % IV SOLN
INTRAVENOUS | Status: DC | PRN
  Administered 2019-03-15: 250 mL via INTRAVENOUS
  Administered 2019-03-18: 09:00:00 via INTRAVENOUS

## 2019-03-15 MED ORDER — ENSURE ENLIVE PO LIQD
237.0000 mL | Freq: Three times a day (TID) | ORAL | Status: DC
  Administered 2019-03-15 – 2019-03-17 (×6): 237 mL via ORAL

## 2019-03-15 MED ORDER — SODIUM CHLORIDE 0.9 % IV SOLN
300.0000 mg | Freq: Once | INTRAVENOUS | Status: AC
  Administered 2019-03-15: 300 mg via INTRAVENOUS
  Filled 2019-03-15: qty 15

## 2019-03-15 MED ORDER — SODIUM CHLORIDE 0.9% IV SOLUTION
Freq: Once | INTRAVENOUS | Status: AC
  Administered 2019-03-15: 15:00:00 via INTRAVENOUS

## 2019-03-15 MED ORDER — POTASSIUM CHLORIDE 10 MEQ/100ML IV SOLN
10.0000 meq | INTRAVENOUS | Status: AC
  Administered 2019-03-15 (×4): 10 meq via INTRAVENOUS
  Filled 2019-03-15 (×4): qty 100

## 2019-03-15 MED ORDER — SODIUM CHLORIDE 0.9 % IV BOLUS
1000.0000 mL | Freq: Once | INTRAVENOUS | Status: AC
  Administered 2019-03-15: 1000 mL via INTRAVENOUS

## 2019-03-15 MED ORDER — VITAMIN C 500 MG PO TABS
500.0000 mg | ORAL_TABLET | Freq: Two times a day (BID) | ORAL | Status: DC
  Administered 2019-03-15 – 2019-03-19 (×9): 500 mg via ORAL
  Filled 2019-03-15 (×9): qty 1

## 2019-03-15 MED ORDER — THIAMINE HCL 100 MG/ML IJ SOLN
500.0000 mg | Freq: Three times a day (TID) | INTRAVENOUS | Status: AC
  Administered 2019-03-15 – 2019-03-17 (×6): 500 mg via INTRAVENOUS
  Filled 2019-03-15 (×7): qty 5

## 2019-03-15 MED ORDER — THIAMINE HCL 100 MG/ML IJ SOLN
250.0000 mg | Freq: Every day | INTRAVENOUS | Status: DC
  Administered 2019-03-19: 250 mg via INTRAVENOUS
  Filled 2019-03-15 (×3): qty 2.5

## 2019-03-15 MED ORDER — VITAMIN B-1 100 MG PO TABS: 100.0000 mg | ORAL_TABLET | Freq: Every day | ORAL | Status: DC

## 2019-03-15 NOTE — NC FL2 (Signed)
[place] LEVEL OF CARE SCREENING TOOL     IDENTIFICATION  Patient Name: Edward Singleton Birthdate: November 14, 1950 Sex: male Admission Date (Current Location): 03/14/2019  [place] and [place] Number:  Engineering geologist and Address:  [facility], [address], [place], [place] [address]      Provider Number: [id number]  Attending Physician Name and Address:  Dustin Flock, MD  Relative Name and Phone Number:  Chauncy Passy Sister   [phone] or Klotz,Vickey Sister   [phone]     Current Level of Care: Hospital Recommended Level of Care: [place] Prior Approval Number:    Date Approved/Denied:   PASRR Number: [id number]  Discharge Plan: SNF    Current Diagnoses: Patient Active Problem List   Diagnosis Date Noted  . Hypokalemia 03/14/2019  . Alcoholic ketoacidosis 22/11/5425  . Alcohol withdrawal syndrome with complication ([place]) 04/02/7627  . Diabetes ([place]) 02/21/2019  . Left rotator cuff tear arthropathy 12/28/2015  . Hyperlipidemia 12/24/2015  . Tobacco abuse 11/06/2015  . Mood disturbance 11/06/2015  . CAD (coronary artery disease) 10/16/2015  . PAD (peripheral artery disease) ([place]) 10/16/2015  . Preventative health care 08/13/2015  . Renal artery stenosis ([place]) 07/30/2015  . Acute on chronic renal failure ([place]) 07/30/2015  . Kidney atrophy 07/30/2015  . CKD (chronic kidney disease), stage III ([place]) 07/30/2015  . Insomnia 07/22/2015  . History of substance abuse ([place]) 07/15/2015  . BPH (benign prostatic hyperplasia) 07/15/2015  . Gout 07/15/2015  . Kidney stones 05/11/2015  . 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 HTN (hypertension) 11/17/2014  . Alcohol abuse 11/17/2014    Orientation RESPIRATION BLADDER Height & Weight     Time, Situation, Place, Self  O2(2L) Continent Weight: 135 lb 6.4 oz (61.4 kg) Height:  5\' 7"  (170.2 cm)  BEHAVIORAL SYMPTOMS/MOOD NEUROLOGICAL BOWEL NUTRITION STATUS       Continent Diet(Regular)  AMBULATORY STATUS COMMUNICATION OF NEEDS Skin   Limited Assist Verbally Skin abrasions                       Personal Care Assistance Level of Assistance  Dressing, Feeding, Bathing Bathing Assistance: Limited assistance Feeding assistance: Independent Dressing Assistance: Limited assistance     Functional Limitations Info             SPECIAL CARE FACTORS FREQUENCY  PT (By licensed PT), OT (By licensed OT)     PT Frequency: 5x a week OT Frequency: 5x a week            Contractures      Additional Factors Info  Psychotropic Code Status Info: Full Code   Psychotropic Info: escitalopram (LEXAPRO) tablet 10 mg  and traZODone (DESYREL) tablet 50 mg          Current Medications (03/15/2019):  This is the current hospital active medication list Current Facility-Administered Medications  Medication Dose Route Frequency Provider Last Rate Last Dose  . 0.9 %  sodium chloride infusion   Intravenous PRN Dustin Flock, MD 10 mL/hr at 03/15/19 1719 250 mL at 03/15/19 1719  . 0.9 % NaCl with KCl 20 mEq/ L  infusion   Intravenous Continuous Lin Landsman, MD 100 mL/hr at 03/15/19 1301    . acetaminophen (TYLENOL) tablet 650 mg  650 mg Oral Q6H PRN Saundra Shelling, MD   650 mg at 03/14/19 2132   Or  . acetaminophen (TYLENOL) suppository 650 mg  650 mg Rectal Q6H PRN Pyreddy, Reatha Harps,  MD      . allopurinol (ZYLOPRIM) tablet 100 mg  100 mg Oral Daily Pyreddy, Reatha Harps, MD   100 mg at 03/15/19 0920  . aspirin chewable tablet 81 mg  81 mg Oral Daily Saundra Shelling, MD   81 mg at 03/15/19 0923  . calcium gluconate 1 g/ 50 mL sodium chloride IVPB  1 g Intravenous Once Dustin Flock, MD 50 mL/hr at 03/15/19 1722 1,000 mg at 03/15/19 1722  . cyanocobalamin ((VITAMIN B-12)) injection 1,000 mcg  1,000 mcg Subcutaneous Daily Lin Landsman, MD   1,000 mcg at 03/15/19 1350  . escitalopram (LEXAPRO) tablet 10 mg  10 mg Oral Daily Pyreddy, Reatha Harps, MD    10 mg at 03/15/19 0921  . feeding supplement (ENSURE ENLIVE) (ENSURE ENLIVE) liquid 237 mL  237 mL Oral TID BM Dustin Flock, MD   237 mL at 03/15/19 1516  . folic acid (FOLVITE) tablet 1 mg  1 mg Oral Daily Pyreddy, Reatha Harps, MD   1 mg at 03/15/19 0921  . HYDROmorphone (DILAUDID) injection 0.5 mg  0.5 mg Intravenous Q4H PRN Saundra Shelling, MD   0.5 mg at 03/15/19 0522  . iron sucrose (VENOFER) 300 mg in sodium chloride 0.9 % 250 mL IVPB  300 mg Intravenous Once Dustin Flock, MD 176.7 mL/hr at 03/15/19 1720 300 mg at 03/15/19 1720  . LORazepam (ATIVAN) injection 0-4 mg  0-4 mg Intravenous Q6H Saundra Shelling, MD       Followed by  . [START ON 03/16/2019] LORazepam (ATIVAN) injection 0-4 mg  0-4 mg Intravenous Q12H Pyreddy, Pavan, MD      . LORazepam (ATIVAN) tablet 1 mg  1 mg Oral Q6H PRN Saundra Shelling, MD       Or  . LORazepam (ATIVAN) injection 1 mg  1 mg Intravenous Q6H PRN Pyreddy, Pavan, MD      . midodrine (PROAMATINE) tablet 10 mg  10 mg Oral TID WC Dustin Flock, MD   10 mg at 03/15/19 1729  . multivitamin with minerals tablet 1 tablet  1 tablet Oral Daily Pyreddy, Pavan, MD   1 tablet at 03/15/19 0921  . nicotine (NICODERM CQ - dosed in mg/24 hours) patch 21 mg  21 mg Transdermal Daily Pyreddy, Reatha Harps, MD   21 mg at 03/15/19 0933  . ondansetron (ZOFRAN) tablet 4 mg  4 mg Oral Q6H PRN Pyreddy, Reatha Harps, MD       Or  . ondansetron (ZOFRAN) injection 4 mg  4 mg Intravenous Q6H PRN Pyreddy, Reatha Harps, MD      . oxyCODONE-acetaminophen (PERCOCET/ROXICET) 5-325 MG per tablet 1 tablet  1 tablet Oral Q6H PRN Saundra Shelling, MD   1 tablet at 03/15/19 1722  . pantoprazole (PROTONIX) injection 40 mg  40 mg Intravenous Q12H Saundra Shelling, MD   40 mg at 03/15/19 0923  . rosuvastatin (CRESTOR) tablet 20 mg  20 mg Oral Daily Pyreddy, Reatha Harps, MD   20 mg at 03/15/19 1729  . senna-docusate (Senokot-S) tablet 1 tablet  1 tablet Oral QHS PRN Saundra Shelling, MD      . Derrill Memo ON 03/18/2019] thiamine (B-1) 250 mg in  sodium chloride 0.9 % 50 mL IVPB  250 mg Intravenous Daily Dustin Flock, MD      . Derrill Memo ON 03/23/2019] thiamine (VITAMIN B-1) tablet 100 mg  100 mg Oral Daily Dustin Flock, MD      . thiamine 500mg  in normal saline (61ml) IVPB  500 mg Intravenous TID Dustin Flock, MD      .  traZODone (DESYREL) tablet 50 mg  50 mg Oral Daily Saundra Shelling, MD   Stopped at 03/15/19 [phone]  . vitamin C (ASCORBIC ACID) tablet 500 mg  500 mg Oral BID Dustin Flock, MD   500 mg at 03/15/19 1515     Discharge Medications: Please see discharge summary for a list of discharge medications.  Relevant Imaging Results:  Relevant Lab Results:   Additional Information SSN [id number]  Ross Ludwig, LCSW

## 2019-03-15 NOTE — Progress Notes (Addendum)
Pt Hgb 7.1 and potassium at 3.0. Page prime and talked to Dr. Sidney Ace give 40 meq potassium 0800 and 40 meq at 1000 and check mag level and for Hgb states to notify incoming shift of what they want to do as per Dr. Sidney Ace. If Mag level is < 2.0 give 2 gram mag sulfate. Will continue to monitor.

## 2019-03-15 NOTE — Progress Notes (Signed)
Initial Nutrition Assessment  DOCUMENTATION CODES:   Not applicable  INTERVENTION:   Ensure Enlive po TID, each supplement provides 350 kcal and 20 grams of protein  Vitamin C 500mg  po BID  MVI and folic acid in setting of etoh abuse  Recommend 500 mg of thiamine intravenously, infused over 30 minutes, three times daily for two consecutive days and 250 mg intravenously once daily for an additional five days  Pt at high refeed risk; recommend monitor K, Mg and P labs daily until stable   NUTRITION DIAGNOSIS:   Predicted suboptimal nutrient intake related to social / environmental circumstances(etoh abuse) as evidenced by other (comment)(per chart review )  GOAL:   Patient will meet greater than or equal to 90% of their needs  MONITOR:   PO intake, Supplement acceptance, Labs, Weight trends, I & O's, Skin  REASON FOR ASSESSMENT:   Malnutrition Screening Tool    ASSESSMENT:   68 year old male patient with a known history of alcohol abuse, prostate hypertrophy, alcoholic dementia, type 2 diabetes mellitus, gout, hyperlipidemia, hypertension, nephrolithiasis presented to the emergency room for frequent falls.    RD working remotely.  Suspect pt with poor appetite and oral intake at baseline r/t etoh abuse. Per chart, pt has lost 20lbs(13%) over the past 6 months; this is severe weight loss. RD will add supplements and MVI to help pt meet his estimated needs. Pt on CIWA protocol. Will give high dose IV thiamine in setting of possible Wernicke's. Will also initiate vitamin C in setting of etoh abuse and ecchymosis.Pt likely at high refeed risk; recommend monitor K, Mg and P las daily once oral intake improves.   Pt at high risk for malnutrition but unable to diagnose at this time as NFPE cannot be performed.     Medications reviewed and include: allopurinol, aspirin, B12, MVI, nicotine, protonix, thiamine, vitamin C, NaCl w/ KCl @100ml /hr, Ca gluconate, KCl, iron sucrose  Labs  reviewed: Na 130(L), K 3.0(L), Cl 97(L), BUN 90(H), creat 4.89(H), Mg 1.7 Iron 34(L), TIBC 262, ferritin 135, folate 41.0, B12 163(L) Wbc- 14.9(H), Hgb 7.1(L), Hct 20.6(L)  Unable to complete Nutrition-Focused physical exam at this time.   Diet Order:   Diet Order            Diet regular Room service appropriate? Yes; Fluid consistency: Thin  Diet effective now             EDUCATION NEEDS:   Not appropriate for education at this time  Skin:  Skin Assessment: Reviewed RN Assessment(inner aspect of left forearm: 10cm x 2.5cm x 0.2cm, Left hand: affected area measures 3cm x 4cm with area of depth measuring 0.1cm)  Last BM:  pta  Height:   Ht Readings from Last 1 Encounters:  03/14/19 5\' 7"  (1.702 m)    Weight:   Wt Readings from Last 1 Encounters:  03/14/19 61.4 kg    Ideal Body Weight:  67 kg  BMI:  Body mass index is 21.21 kg/m.  Estimated Nutritional Needs:   Kcal:  1700-2000kcal/day   Protein:  85-100g/day   Fluid:  >1.5L/day   Koleen Distance MS, RD, LDN Pager #- [phone] Office#- [phone] After Hours Pager: [phone]

## 2019-03-15 NOTE — Plan of Care (Signed)
  Problem: Education: Goal: Knowledge of General Education information will improve Description: Including pain rating scale, medication(s)/side effects and non-pharmacologic comfort measures Outcome: Progressing   Problem: Pain Managment: Goal: General experience of comfort will improve Outcome: Progressing   Problem: Safety: Goal: Ability to remain free from injury will improve Outcome: Progressing   

## 2019-03-15 NOTE — Consult Note (Signed)
[facility]  Date: 03/15/2019                  Patient Name:  Edward Singleton  MRN: [id number]  DOB: [birth year]  Age / Sex: 68 y.o., male         PCP: Windy Fast, MD                 Service Requesting Consult:  Internal medicine/ Dustin Flock, MD                 Reason for Consult:  Acute kidney injury            History of Present Illness: Patient is a 68 y.o. male with medical problems of DM, HTN, Alcohol abuse, who was admitted to [facility] on 03/14/2019 for evaluation after a fall.  His baseline creatinine is 1.5 from May 15.  Admission creatinine was found to be 5.83, BUN 19.  With IV hydration, creatinine has improved to 4.9, BUN 90.  Potassium remains low at 3.0.  Patient admits to drinking half a bottle of "Canadian mist".  He states he was walking from his bedroom to the hallway and fell down.  Staff from [facility] found him.  He does not remember how long he was down for but probably a few hours.  CK level is minimally elevated at 189. Renal imaging: Renal ultrasound today shows normal right kidney, atrophic left kidney    Medications: Outpatient medications: Medications Prior to Admission  Medication Sig Dispense Refill Last Dose  . allopurinol (ZYLOPRIM) 100 MG tablet Take 100 mg by mouth daily.   03/13/2019 at Unknown time  . amLODipine (NORVASC) 5 MG tablet Take 5 mg by mouth daily.   03/13/2019 at Unknown time  . aspirin 81 MG chewable tablet Chew 81 mg by mouth daily.   03/13/2019 at Unknown time  . escitalopram (LEXAPRO) 10 MG tablet Take 10 mg by mouth daily.   03/13/2019 at Unknown time  . folic acid (FOLVITE) 1 MG tablet Take 1 mg by mouth daily.   03/13/2019 at Unknown time  . metFORMIN (GLUCOPHAGE) 500 MG tablet Take 500 mg by mouth 2 (two) times a day.   03/13/2019 at Unknown time  . metoprolol succinate (TOPROL-XL) 25 MG 24 hr tablet Take 25 mg by mouth daily.   03/13/2019 at Unknown time  . rosuvastatin (CRESTOR) 20 MG tablet Take 20 mg  by mouth daily.   03/13/2019 at Unknown time  . thiamine (VITAMIN B-1) 100 MG tablet Take 100 mg by mouth daily.   03/13/2019 at Unknown time  . traZODone (DESYREL) 50 MG tablet Take 50 mg by mouth daily.   03/13/2019 at Unknown time    Current medications: Current Facility-Administered Medications  Medication Dose Route Frequency Provider Last Rate Last Dose  . 0.9 %  sodium chloride infusion (Manually program via Guardrails IV Fluids)   Intravenous Once Dustin Flock, MD      . 0.9 % NaCl with KCl 20 mEq/ L  infusion   Intravenous Continuous Pyreddy, Reatha Harps, MD 75 mL/hr at 03/14/19 2132    . acetaminophen (TYLENOL) tablet 650 mg  650 mg Oral Q6H PRN Saundra Shelling, MD   650 mg at 03/14/19 2132   Or  . acetaminophen (TYLENOL) suppository 650 mg  650 mg Rectal Q6H PRN Saundra Shelling, MD      . allopurinol (ZYLOPRIM) tablet 100 mg  100 mg Oral Daily Saundra Shelling, MD  100 mg at 03/15/19 0920  . aspirin chewable tablet 81 mg  81 mg Oral Daily Saundra Shelling, MD   81 mg at 03/15/19 0923  . calcium gluconate 1 g/ 50 mL sodium chloride IVPB  1 g Intravenous Once Dustin Flock, MD      . escitalopram (LEXAPRO) tablet 10 mg  10 mg Oral Daily Pyreddy, Reatha Harps, MD   10 mg at 15/17/61 6073  . folic acid (FOLVITE) tablet 1 mg  1 mg Oral Daily Pyreddy, Reatha Harps, MD   1 mg at 03/15/19 0921  . HYDROmorphone (DILAUDID) injection 0.5 mg  0.5 mg Intravenous Q4H PRN Saundra Shelling, MD   0.5 mg at 03/15/19 0522  . LORazepam (ATIVAN) injection 0-4 mg  0-4 mg Intravenous Q6H Saundra Shelling, MD       Followed by  . [START ON 03/16/2019] LORazepam (ATIVAN) injection 0-4 mg  0-4 mg Intravenous Q12H Pyreddy, Pavan, MD      . LORazepam (ATIVAN) tablet 1 mg  1 mg Oral Q6H PRN Saundra Shelling, MD       Or  . LORazepam (ATIVAN) injection 1 mg  1 mg Intravenous Q6H PRN Pyreddy, Pavan, MD      . magnesium sulfate IVPB 2 g 50 mL  2 g Intravenous Once Dustin Flock, MD 50 mL/hr at 03/15/19 0931 2 g at 03/15/19 0931  . midodrine  (PROAMATINE) tablet 10 mg  10 mg Oral TID WC Dustin Flock, MD      . multivitamin with minerals tablet 1 tablet  1 tablet Oral Daily Pyreddy, Pavan, MD   1 tablet at 03/15/19 0921  . nicotine (NICODERM CQ - dosed in mg/24 hours) patch 21 mg  21 mg Transdermal Daily Saundra Shelling, MD   21 mg at 03/15/19 0933  . ondansetron (ZOFRAN) tablet 4 mg  4 mg Oral Q6H PRN Pyreddy, Reatha Harps, MD       Or  . ondansetron (ZOFRAN) injection 4 mg  4 mg Intravenous Q6H PRN Pyreddy, Reatha Harps, MD      . oxyCODONE-acetaminophen (PERCOCET/ROXICET) 5-325 MG per tablet 1 tablet  1 tablet Oral Q6H PRN Saundra Shelling, MD   1 tablet at 03/15/19 0829  . pantoprazole (PROTONIX) injection 40 mg  40 mg Intravenous Q12H Saundra Shelling, MD   40 mg at 03/15/19 0923  . potassium chloride 10 mEq in 100 mL IVPB  10 mEq Intravenous Q1 Hr x 4 Dustin Flock, MD      . rosuvastatin (CRESTOR) tablet 20 mg  20 mg Oral Daily Pyreddy, Pavan, MD      . senna-docusate (Senokot-S) tablet 1 tablet  1 tablet Oral QHS PRN Saundra Shelling, MD      . thiamine (VITAMIN B-1) tablet 100 mg  100 mg Oral Daily Pyreddy, Pavan, MD   100 mg at 03/15/19 0920  . traZODone (DESYREL) tablet 50 mg  50 mg Oral Daily Saundra Shelling, MD   Stopped at 03/15/19 7106      Allergies: No Known Allergies    Past Medical History: Past Medical History:  Diagnosis Date  . Alcohol abuse   . Atrophic kidney   . Benign enlargement of prostate   . 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 Diabetes mellitus without complication ([place])   . Gout   . Gout   . Hypercholesteremia   . Hypertension   . Kidney stones   . Left flank pain, chronic   . Pelvic fracture ([place]) 11/17/2014     Past Surgical  History: Past Surgical History:  Procedure Laterality Date  . left renal stent placement  2013  . LITHOTRIPSY    . ORIF HUMERUS FRACTURE Left 11/18/2014   Procedure: OPEN REDUCTION INTERNAL FIXATION (ORIF) DISTAL HUMERUS FRACTURE;  Surgeon: Rozanna Box, MD;  Location: [place];  Service: Orthopedics;  Laterality: Left;  . SPLENECTOMY       Family History: Family History  Problem Relation Age of Onset  . Heart disease Mother   . Heart disease Father   . Diabetes Father   . Nephrolithiasis Paternal Grandfather   . Kidney disease Neg Hx   . Prostate cancer Neg Hx      Social History: Social History   Socioeconomic History  . Marital status: Single    Spouse name: Not on file  . Number of children: Not on file  . Years of education: Not on file  . Highest education level: Not on file  Occupational History  . Not on file  Social Needs  . Financial resource strain: Not on file  . Food insecurity:    Worry: Not on file    Inability: Not on file  . Transportation needs:    Medical: Not on file    Non-medical: Not on file  Tobacco Use  . Smoking status: Current Every Day Smoker    Packs/day: 1.00    Years: 50.00    Pack years: 50.00    Types: Cigarettes  . Smokeless tobacco: Current User    Types: Chew  Substance and Sexual Activity  . Alcohol use: Yes    Alcohol/week: 42.0 standard drinks    Types: 42 Cans of beer per week  . Drug use: No  . Sexual activity: Not on file  Lifestyle  . Physical activity:    Days per week: Not on file    Minutes per session: Not on file  . Stress: Not on file  Relationships  . Social connections:    Talks on phone: Not on file    Gets together: Not on file    Attends religious service: Not on file    Active member of club or organization: Not on file    Attends meetings of clubs or organizations: Not on file    Relationship status: Not on file  . Intimate partner violence:    Fear of current or ex partner: Not on file    Emotionally abused: Not on file    Physically abused: Not on file    Forced sexual activity: Not on file  Other Topics Concern  . Not on file  Social History Narrative  . Not on file    Gen: Denies any fevers or chills HEENT: No vision or hearing problems CV: No chest  pain or shortness of breath Resp: + cough, no sputum production GI: No nausea, vomiting or diarrhea.  No blood in the stool GU : No problems with voiding.  No hematuria. H/o frequent kidney stones and Lithotripsy MS: Complains of left shoulder pain from the fall Derm:   No complaints Psych: No complaints Heme: No complaints Neuro: No complaints Endocrine: No complaints    Vital Signs: Blood pressure (!) 83/49, pulse 74, temperature 98.2 F (36.8 C), temperature source Oral, resp. rate 20, height 5\' 7"  (1.702 m), weight 61.4 kg, SpO2 92 %.   Intake/Output Summary (Last 24 hours) at 03/15/2019 1012 Last data filed at 03/15/2019 0818 Gross per 24 hour  Intake 150 ml  Output 375 ml  Net -225 ml  Weight trends: Filed Weights   03/14/19 1447 03/14/19 1951  Weight: 72.6 kg 61.4 kg    Physical Exam: General:  Frail gentleman, appears older than stated age  12  moist oral mucous membranes  Neck:  Supple  Lungs:  Coarse breath sounds at bases  Heart::  No rub or gallop  Abdomen:  Soft, nontender  Extremities:  Trace edema  Neurologic:  Alert, able to answer questions  Skin:  Scattered ecchymosis    Lab results: Basic Metabolic Panel: Recent Labs  Lab 03/14/19 1506 03/14/19 2017 03/15/19 0530  NA 129*  --  130*  K 2.9*  --  3.0*  CL 88*  --  97*  CO2 16*  --  17*  GLUCOSE 121*  --  148*  BUN 94*  --  90*  CREATININE 5.83*  --  4.89*  CALCIUM 9.1  --  7.9*  MG  --  1.6* 1.7    Liver Function Tests: Recent Labs  Lab 03/14/19 1506  AST 23  ALT 15  ALKPHOS 139*  BILITOT 1.0  PROT 7.4  ALBUMIN 4.2   No results for input(s): LIPASE, AMYLASE in the last 168 hours. No results for input(s): AMMONIA in the last 168 hours.  CBC: Recent Labs  Lab 03/14/19 1506 03/14/19 2210 03/15/19 0530  WBC 18.7*  --  14.9*  NEUTROABS 15.4*  --   --   HGB 9.5* 7.2* 7.1*  HCT 26.7* 20.7* 20.6*  MCV 96.4  --  98.6  PLT 303  --  232    Cardiac Enzymes: Recent Labs   Lab 03/14/19 1506  CKTOTAL 189  TROPONINI <0.03    BNP: Invalid input(s): POCBNP  CBG: No results for input(s): GLUCAP in the last 168 hours.  Microbiology: Recent Results (from the past 720 hour(s))  SARS Coronavirus 2 (CEPHEID - Performed in [place] hospital lab), Hosp Order     Status: None   Collection Time: 02/21/19  1:52 AM  Result Value Ref Range Status   SARS Coronavirus 2 NEGATIVE NEGATIVE Final    Comment: (NOTE) If result is NEGATIVE SARS-CoV-2 target nucleic acids are NOT DETECTED. The SARS-CoV-2 RNA is generally detectable in upper and lower  respiratory specimens during the acute phase of infection. The lowest  concentration of SARS-CoV-2 viral copies this assay can detect is 250  copies / mL. A negative result does not preclude SARS-CoV-2 infection  and should not be used as the sole basis for treatment or other  patient management decisions.  A negative result may occur with  improper specimen collection / handling, submission of specimen other  than nasopharyngeal swab, presence of viral mutation(s) within the  areas targeted by this assay, and inadequate number of viral copies  (<250 copies / mL). A negative result must be combined with clinical  observations, patient history, and epidemiological information. If result is POSITIVE SARS-CoV-2 target nucleic acids are DETECTED. The SARS-CoV-2 RNA is generally detectable in upper and lower  respiratory specimens dur ing the acute phase of infection.  Positive  results are indicative of active infection with SARS-CoV-2.  Clinical  correlation with patient history and other diagnostic information is  necessary to determine patient infection status.  Positive results do  not rule out bacterial infection or co-infection with other viruses. If result is PRESUMPTIVE POSTIVE SARS-CoV-2 nucleic acids MAY BE PRESENT.   A presumptive positive result was obtained on the submitted specimen  and confirmed on repeat  testing.  While 2019 novel  coronavirus  (SARS-CoV-2) nucleic acids may be present in the submitted sample  additional confirmatory testing may be necessary for epidemiological  and / or clinical management purposes  to differentiate between  SARS-CoV-2 and other Sarbecovirus currently known to infect humans.  If clinically indicated additional testing with an alternate test  methodology [phone]) is advised. The SARS-CoV-2 RNA is generally  detectable in upper and lower respiratory sp ecimens during the acute  phase of infection. The expected result is Negative. Fact Sheet for Patients:  StrictlyIdeas.no Fact Sheet for Healthcare Providers: BankingDealers.co.za This test is not yet approved or cleared by the [place] FDA and has been authorized for detection and/or diagnosis of SARS-CoV-2 by FDA under an Emergency Use Authorization (EUA).  This EUA will remain in effect (meaning this test can be used) for the duration of the COVID-19 declaration under Section 564(b)(1) of the Act, 21 U.S.C. section 360bbb-3(b)(1), unless the authorization is terminated or revoked sooner. Performed at [facility], [address] [place], [place] [address]   MRSA PCR Screening     Status: None   Collection Time: 02/21/19  3:36 AM  Result Value Ref Range Status   MRSA by PCR NEGATIVE NEGATIVE Final    Comment: Performed at [facility], [address] [place], [place] [address]  SARS Coronavirus 2 (CEPHEID - Performed in [place] hospital lab), Hosp Order     Status: None   Collection Time: 03/14/19  6:11 PM  Result Value Ref Range Status   SARS Coronavirus 2 NEGATIVE NEGATIVE Final    Comment: (NOTE) If result is NEGATIVE SARS-CoV-2 target nucleic acids are NOT DETECTED. The SARS-CoV-2 RNA is generally detectable in upper and lower  respiratory specimens during the acute phase of infection. The lowest  concentration of  SARS-CoV-2 viral copies this assay can detect is 250  copies / mL. A negative result does not preclude SARS-CoV-2 infection  and should not be used as the sole basis for treatment or other  patient management decisions.  A negative result may occur with  improper specimen collection / handling, submission of specimen other  than nasopharyngeal swab, presence of viral mutation(s) within the  areas targeted by this assay, and inadequate number of viral copies  (<250 copies / mL). A negative result must be combined with clinical  observations, patient history, and epidemiological information. If result is POSITIVE SARS-CoV-2 target nucleic acids are DETECTED. The SARS-CoV-2 RNA is generally detectable in upper and lower  respiratory specimens dur ing the acute phase of infection.  Positive  results are indicative of active infection with SARS-CoV-2.  Clinical  correlation with patient history and other diagnostic information is  necessary to determine patient infection status.  Positive results do  not rule out bacterial infection or co-infection with other viruses. If result is PRESUMPTIVE POSTIVE SARS-CoV-2 nucleic acids MAY BE PRESENT.   A presumptive positive result was obtained on the submitted specimen  and confirmed on repeat testing.  While 2019 novel coronavirus  (SARS-CoV-2) nucleic acids may be present in the submitted sample  additional confirmatory testing may be necessary for epidemiological  and / or clinical management purposes  to differentiate between  SARS-CoV-2 and other Sarbecovirus currently known to infect humans.  If clinically indicated additional testing with an alternate test  methodology [phone]) is advised. The SARS-CoV-2 RNA is generally  detectable in upper and lower respiratory sp ecimens during the acute  phase of infection. The expected result is Negative. Fact Sheet for  Patients:  StrictlyIdeas.no Fact Sheet for Healthcare  Providers: BankingDealers.co.za This test is not yet approved or cleared by the [place] FDA and has been authorized for detection and/or diagnosis of SARS-CoV-2 by FDA under an Emergency Use Authorization (EUA).  This EUA will remain in effect (meaning this test can be used) for the duration of the COVID-19 declaration under Section 564(b)(1) of the Act, 21 U.S.C. section 360bbb-3(b)(1), unless the authorization is terminated or revoked sooner. Performed at [facility], [address] [place], [place] [address]      Coagulation Studies: No results for input(s): LABPROT, INR in the last 72 hours.  Urinalysis: No results for input(s): COLORURINE, LABSPEC, PHURINE, GLUCOSEU, HGBUR, BILIRUBINUR, KETONESUR, PROTEINUR, UROBILINOGEN, NITRITE, LEUKOCYTESUR in the last 72 hours.  Invalid input(s): APPERANCEUR      Imaging: Dg Elbow Complete Left  Result Date: 03/14/2019 CLINICAL DATA:  Left elbow pain after fall. EXAM: LEFT ELBOW - COMPLETE 3+ VIEW COMPARISON:  Radiographs of November 18, 2014. FINDINGS: Status post surgical internal fixation of old distal left humeral fracture. Heterotopic bone formation is noted. Degenerative changes are seen involving the joint spaces between the humerus and radius and ulna. No acute fracture or dislocation is noted. Probable soft tissue hematoma is noted overlying the proximal ulna. IMPRESSION: Extensive postsurgical and degenerative changes are noted as described above. Probable soft tissue hematoma seen overlying proximal ulna. No acute fracture or dislocation is noted. Electronically Signed   By: Marijo Conception M.D.   On: 03/14/2019 15:17   Ct Head Wo Contrast  Result Date: 03/14/2019 CLINICAL DATA:  Posttraumatic headache and confusion after fall at home. EXAM: CT HEAD WITHOUT CONTRAST CT CERVICAL SPINE WITHOUT CONTRAST TECHNIQUE: Multidetector CT imaging of the head and cervical spine was performed following the  standard protocol without intravenous contrast. Multiplanar CT image reconstructions of the cervical spine were also generated. COMPARISON:  CT scan of Feb 20, 2019. FINDINGS: CT HEAD FINDINGS Brain: Mild diffuse cortical atrophy is noted. Mild chronic ischemic white matter disease is noted. 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 No mass effect or midline shift is noted. Ventricular size is within normal limits. There is no evidence of mass lesion, hemorrhage or acute infarction. Vascular: No hyperdense vessel or unexpected calcification. Skull: Normal. Negative for fracture or focal lesion. Sinuses/Orbits: No acute finding. Other: None. CT CERVICAL SPINE FINDINGS Alignment: Grade 1 anterolisthesis of C4-5 is noted secondary to posterior facet joint hypertrophy. Skull base and vertebrae: No acute fracture. No primary bone lesion or focal pathologic process. Soft tissues and spinal canal: No prevertebral fluid or swelling. No visible canal hematoma. Disc levels: Severe degenerative disc disease is noted at C5-6 and C6-7 with anterior posterior osteophyte formation. Upper chest: Negative. Other: Degenerative changes are seen involving posterior facet joints bilaterally. IMPRESSION: Mild diffuse cortical atrophy. Mild chronic ischemic white matter disease. No acute intracranial abnormality seen. Multilevel degenerative disc disease. No acute abnormality seen in the cervical spine. Electronically Signed   By: Marijo Conception M.D.   On: 03/14/2019 15:32   Ct Cervical Spine Wo Contrast  Result Date: 03/14/2019 CLINICAL DATA:  Posttraumatic headache and confusion after fall at home. EXAM: CT HEAD WITHOUT CONTRAST CT CERVICAL SPINE WITHOUT CONTRAST TECHNIQUE: Multidetector CT imaging of the head and cervical spine was performed following the standard protocol without intravenous contrast. Multiplanar CT image reconstructions of the cervical spine were also generated. COMPARISON:   CT scan of Feb 20, 2019. FINDINGS: CT HEAD  FINDINGS Brain: Mild diffuse cortical atrophy is noted. Mild chronic ischemic white matter disease is noted. 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 No mass effect or midline shift is noted. Ventricular size is within normal limits. There is no evidence of mass lesion, hemorrhage or acute infarction. Vascular: No hyperdense vessel or unexpected calcification. Skull: Normal. Negative for fracture or focal lesion. Sinuses/Orbits: No acute finding. Other: None. CT CERVICAL SPINE FINDINGS Alignment: Grade 1 anterolisthesis of C4-5 is noted secondary to posterior facet joint hypertrophy. Skull base and vertebrae: No acute fracture. No primary bone lesion or focal pathologic process. Soft tissues and spinal canal: No prevertebral fluid or swelling. No visible canal hematoma. Disc levels: Severe degenerative disc disease is noted at C5-6 and C6-7 with anterior posterior osteophyte formation. Upper chest: Negative. Other: Degenerative changes are seen involving posterior facet joints bilaterally. IMPRESSION: Mild diffuse cortical atrophy. Mild chronic ischemic white matter disease. No acute intracranial abnormality seen. Multilevel degenerative disc disease. No acute abnormality seen in the cervical spine. Electronically Signed   By: Marijo Conception M.D.   On: 03/14/2019 15:32   Dg Shoulder Left  Result Date: 03/14/2019 CLINICAL DATA:  Fall.  Pain. EXAM: LEFT SHOULDER - 2+ VIEW COMPARISON:  CT 09/05/2018. FINDINGS: Acromioclavicular glenohumeral degenerative change. Old non unified fracture of the distal clavicle noted. Angulated old non healed fracture proximal left humerus. Proximal humerus consistent with non healed subcapital fracture. Angulated subcapital fracture of the left proximal humerus is noted on prior CT of 09/05/2018. Plate screw fixation distal humerus. No new abnormality identified. Negative. IMPRESSION: 1. Old non unified  fracture left distal clavicle. Angulated old non healed fracture left proximal humerus. 2.  No acute abnormality. Electronically Signed   By: Marcello Moores  Register   On: 03/14/2019 15:17      Assessment & Plan: Pt is a 68 y.o. Caucasian  male with alcohol abuse, atrophic left kidney, history of BPH, diabetes, gout, hypertension, history of kidney stones, coronary artery disease and atherosclerosis noted on CT abdomen,, was admitted on 03/14/2019 after he was found down from a fall.   1.  Acute kidney injury Patient is nonoliguric.  Serum creatinine at admission was 5.8 today, has improved to 4.9 with IV hydration.  Renal ultrasound shows atrophic left kidney and normal right kidney.  No kidney stones or obstruction.  Continue IV hydration.  We will continue to monitor  2.  Chronic kidney disease stage III.  With atrophic left kidney and history of multiple kidney stones and history of lithotripsy Baseline creatinine 1.5/GFR 47 Hematuria and proteinuria noted on previous urinalysis We will repeat  3.  Diabetes type 2 with chronic kidney disease Lab Results  Component Value Date   HGBA1C 5.6 01/15/2014   Obtain recent hemoglobin A1c  4.  History of alcohol abuse Withdrawal protocol and vitamin supplementation as per primary team     LOS: [place] 6/5/202010:[address] [place], [place] [address] Phone [phone]. Fax [phone]   Note: This note was prepared with Dragon dictation. Any transcription errors are unintentional

## 2019-03-15 NOTE — Progress Notes (Signed)
Patient sister has been updated all questions answered.

## 2019-03-15 NOTE — Consult Note (Signed)
Cephas Darby, MD [address]  [place]  [place], [place] [address]  Main: [phone]  Fax: [phone] Pager: [phone]   Consultation  Referring Provider:     No ref. provider found Primary Care Physician:  Windy Fast, MD Primary Gastroenterologist: None         Reason for Consultation:     Anemia  Date of Admission:  03/14/2019 Date of Consultation:  03/15/2019         HPI:   Edward Singleton is a 68 y.o. male with history of alcohol abuse, diabetes, hypertension brought by family to the ER last night due to history of multiple falls recently, generalized weakness, lethargy.  In the ER, he was found to hypokalemia, severe AKI, severe anemia.  He underwent CT head which was unremarkable.  He did not sustain any new fractures.  His hemoglobin on presentation was 9.5.  It was 7.5 about a month ago.  Normal platelets.  Patient received IV fluids, hemoglobin dropped to 7.1 today.  He did not have active GI bleed but he was tested positive on guaiac stool and therefore GI is consulted for further evaluation.  He has been drinking alcohol actively.  He does not have cirrhosis.  When I interviewed the patient, he denies abdominal pain, nausea or vomiting.  He denied melena, hematochezia, rectal bleeding, hematemesis or coffee-ground emesis.   NSAIDs: None  Antiplts/Anticoagulants/Anti thrombotics: None  GI Procedures: None  Past Medical History:  Diagnosis Date  . Alcohol abuse   . Atrophic kidney   . Benign enlargement of prostate   . 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 Diabetes mellitus without complication ([facility])   . Gout   . Gout   . Hypercholesteremia   . Hypertension   . Kidney stones   . Left flank pain, chronic   . Pelvic fracture ([facility]) 11/17/2014    Past Surgical History:  Procedure Laterality Date  . left renal stent placement  2013  . LITHOTRIPSY    . ORIF HUMERUS FRACTURE Left 11/18/2014   Procedure: OPEN REDUCTION INTERNAL FIXATION (ORIF)  DISTAL HUMERUS FRACTURE;  Surgeon: Rozanna Box, MD;  Location: [facility];  Service: Orthopedics;  Laterality: Left;  . SPLENECTOMY      Prior to Admission medications   Medication Sig Start Date End Date Taking? Authorizing Provider  allopurinol (ZYLOPRIM) 100 MG tablet Take 100 mg by mouth daily.   Yes [provider]  amLODipine (NORVASC) 5 MG tablet Take 5 mg by mouth daily.   Yes [provider]  aspirin 81 MG chewable tablet Chew 81 mg by mouth daily.   Yes [provider]  escitalopram (LEXAPRO) 10 MG tablet Take 10 mg by mouth daily.   Yes [provider]  folic acid (FOLVITE) 1 MG tablet Take 1 mg by mouth daily.   Yes [provider]  metFORMIN (GLUCOPHAGE) 500 MG tablet Take 500 mg by mouth 2 (two) times a day.   Yes [provider]  metoprolol succinate (TOPROL-XL) 25 MG 24 hr tablet Take 25 mg by mouth daily.   Yes [provider]  rosuvastatin (CRESTOR) 20 MG tablet Take 20 mg by mouth daily.   Yes [provider]  thiamine (VITAMIN B-1) 100 MG tablet Take 100 mg by mouth daily.   Yes [provider]  traZODone (DESYREL) 50 MG tablet Take 50 mg by mouth daily.   Yes [provider]  Family History  Problem Relation Age of Onset  . Heart disease Mother   . Heart disease Father   . Diabetes Father   . Nephrolithiasis Paternal Grandfather   . Kidney disease Neg Hx   . Prostate cancer Neg Hx      Social History   Tobacco Use  . Smoking status: Current Every Day Smoker    Packs/day: 1.00    Years: 50.00    Pack years: 50.00    Types: Cigarettes  . Smokeless tobacco: Current User    Types: Chew  Substance Use Topics  . Alcohol use: Yes    Alcohol/week: 42.0 standard drinks    Types: 42 Cans of beer per week  . Drug use: No    Allergies as of 03/14/2019  . (No Known Allergies)    Review of Systems:    All systems reviewed and negative except where noted in HPI.    Physical Exam:  Vital signs in last 24 hours: Temp:  [97.7 F (36.5 C)-98.5 F (36.9 C)] 98.2 F (36.8 C) (06/05 1649) Pulse Rate:  [65-84] 79 (06/05 1649) Resp:  [13-20] 18 (06/05 1432) BP: (76-111)/(49-66) 111/58 (06/05 1649) SpO2:  [84 %-99 %] 93 % (06/05 1649) Weight:  [61.4 kg] 61.4 kg (06/04 1951) Last BM Date: (pt reported LBM was 5 days ago) General:   Pleasant, cooperative in NAD Head:  Normocephalic and atraumatic. Eyes:   No icterus.   Conjunctiva pale. PERRLA. Ears:  Normal auditory acuity. Neck:  Supple; no masses or thyroidomegaly Lungs: Respirations even and unlabored. Lungs clear to auscultation bilaterally.   No wheezes, crackles, or rhonchi.  Heart:  Regular rate and rhythm;  Without murmur, clicks, rubs or gallops Abdomen:  Soft, nondistended, nontender. Normal bowel sounds. No appreciable masses or hepatomegaly.  No rebound or guarding.  Rectal: Revealed liquid brown, mucousy stools Msk:  Symmetrical without gross deformities.  Strength generalized weakness Extremities:  Without edema, cyanosis or clubbing. Neurologic:  Alert and oriented x3;  grossly normal neurologically. Psych:  Alert and cooperative. Normal affect.  LAB RESULTS: CBC Latest Ref Rng & Units 03/15/2019 03/14/2019 03/14/2019  WBC 4.0 - 10.5 K/uL 14.9(H) - 18.7(H)  Hemoglobin 13.0 - 17.0 g/dL 7.1(L) 7.2(L) 9.5(L)  Hematocrit 39.0 - 52.0 % 20.6(L) 20.7(L) 26.7(L)  Platelets 150 - 400 K/uL 232 - 303    BMET BMP Latest Ref Rng & Units 03/15/2019 03/14/2019 02/22/2019  Glucose 70 - 99 mg/dL 148(H) 121(H) 223(H)  BUN 8 - 23 mg/dL 90(H) 94(H) 21  Creatinine 0.61 - 1.24 mg/dL 4.89(H) 5.83(H) 1.52(H)  Sodium 135 - 145 mmol/L 130(L) 129(L) 137  Potassium 3.5 - 5.1 mmol/L 3.0(L) 2.9(L) 3.4(L)  Chloride 98 - 111 mmol/L 97(L) 88(L) 105  CO2 22 - 32 mmol/L 17(L) 16(L) 21(L)  Calcium 8.9 - 10.3 mg/dL 7.9(L) 9.1 8.2(L)    LFT Hepatic Function Latest Ref Rng & Units 03/14/2019 02/22/2019 02/20/2019  Total Protein  6.5 - 8.1 g/dL 7.4 6.0(L) 8.2(H)  Albumin 3.5 - 5.0 g/dL 4.2 3.4(L) 4.7  AST 15 - 41 U/L 23 27 40  ALT 0 - 44 U/L '15 17 25  '$ Alk Phosphatase 38 - 126 U/L 139(H) 112 142(H)  Total Bilirubin 0.3 - 1.2 mg/dL 1.0 1.4(H) 1.4(H)  Bilirubin, Direct 0.1 - 0.5 mg/dL - - -     STUDIES: Dg Elbow Complete Left  Result Date: 03/14/2019 CLINICAL DATA:  Left elbow pain after fall. EXAM: LEFT ELBOW - COMPLETE 3+ VIEW COMPARISON:  Radiographs of  November 18, 2014. FINDINGS: Status post surgical internal fixation of old distal left humeral fracture. Heterotopic bone formation is noted. Degenerative changes are seen involving the joint spaces between the humerus and radius and ulna. No acute fracture or dislocation is noted. Probable soft tissue hematoma is noted overlying the proximal ulna. IMPRESSION: Extensive postsurgical and degenerative changes are noted as described above. Probable soft tissue hematoma seen overlying proximal ulna. No acute fracture or dislocation is noted. Electronically Signed   By: Marijo Conception M.D.   On: 03/14/2019 15:17   Ct Head Wo Contrast  Result Date: 03/14/2019 CLINICAL DATA:  Posttraumatic headache and confusion after fall at home. EXAM: CT HEAD WITHOUT CONTRAST CT CERVICAL SPINE WITHOUT CONTRAST TECHNIQUE: Multidetector CT imaging of the head and cervical spine was performed following the standard protocol without intravenous contrast. Multiplanar CT image reconstructions of the cervical spine were also generated. COMPARISON:  CT scan of Feb 20, 2019. FINDINGS: CT HEAD FINDINGS Brain: Mild diffuse cortical atrophy is noted. Mild chronic ischemic white matter disease is noted. 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 No mass effect or midline shift is noted. Ventricular size is within normal limits. There is no evidence of mass lesion, hemorrhage or acute infarction. Vascular: No hyperdense vessel or unexpected calcification. Skull: Normal.  Negative for fracture or focal lesion. Sinuses/Orbits: No acute finding. Other: None. CT CERVICAL SPINE FINDINGS Alignment: Grade 1 anterolisthesis of C4-5 is noted secondary to posterior facet joint hypertrophy. Skull base and vertebrae: No acute fracture. No primary bone lesion or focal pathologic process. Soft tissues and spinal canal: No prevertebral fluid or swelling. No visible canal hematoma. Disc levels: Severe degenerative disc disease is noted at C5-6 and C6-7 with anterior posterior osteophyte formation. Upper chest: Negative. Other: Degenerative changes are seen involving posterior facet joints bilaterally. IMPRESSION: Mild diffuse cortical atrophy. Mild chronic ischemic white matter disease. No acute intracranial abnormality seen. Multilevel degenerative disc disease. No acute abnormality seen in the cervical spine. Electronically Signed   By: Marijo Conception M.D.   On: 03/14/2019 15:32   Ct Cervical Spine Wo Contrast  Result Date: 03/14/2019 CLINICAL DATA:  Posttraumatic headache and confusion after fall at home. EXAM: CT HEAD WITHOUT CONTRAST CT CERVICAL SPINE WITHOUT CONTRAST TECHNIQUE: Multidetector CT imaging of the head and cervical spine was performed following the standard protocol without intravenous contrast. Multiplanar CT image reconstructions of the cervical spine were also generated. COMPARISON:  CT scan of Feb 20, 2019. FINDINGS: CT HEAD FINDINGS Brain: Mild diffuse cortical atrophy is noted. Mild chronic ischemic white matter disease is noted. 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 No mass effect or midline shift is noted. Ventricular size is within normal limits. There is no evidence of mass lesion, hemorrhage or acute infarction. Vascular: No hyperdense vessel or unexpected calcification. Skull: Normal. Negative for fracture or focal lesion. Sinuses/Orbits: No acute finding. Other: None. CT CERVICAL SPINE FINDINGS Alignment: Grade 1  anterolisthesis of C4-5 is noted secondary to posterior facet joint hypertrophy. Skull base and vertebrae: No acute fracture. No primary bone lesion or focal pathologic process. Soft tissues and spinal canal: No prevertebral fluid or swelling. No visible canal hematoma. Disc levels: Severe degenerative disc disease is noted at C5-6 and C6-7 with anterior posterior osteophyte formation. Upper chest: Negative. Other: Degenerative changes are seen involving posterior facet joints bilaterally. IMPRESSION: Mild diffuse cortical atrophy. Mild chronic ischemic white matter disease. No acute intracranial abnormality seen. Multilevel degenerative disc  disease. No acute abnormality seen in the cervical spine. Electronically Signed   By: Marijo Conception M.D.   On: 03/14/2019 15:32   US Renal  Result Date: 03/15/2019 CLINICAL DATA:  Acute renal injury. EXAM: RENAL / URINARY TRACT ULTRASOUND COMPLETE COMPARISON:  None. FINDINGS: Right Kidney: Renal measurements: 11.5 x 6.0 x 5.8 cm = volume: 213 mL . Echogenicity within normal limits. No mass or hydronephrosis visualized. Left Kidney: Renal measurements: 9.1 x 3.6 by 4.3 cm = volume: 75 mL. Increased cortical echogenicity. No hydronephrosis. 4 cm anechoic cyst associated with the LEFT kidney appears benign. Bladder: Appears normal for degree of bladder distention. IMPRESSION: 1. Normal RIGHT kidney.  No hydronephrosis. 2. Atrophic LEFT kidney.  No hydronephrosis. Electronically Signed   By: Suzy Bouchard M.D.   On: 03/15/2019 12:13   Dg Chest Port 1 View  Result Date: 03/15/2019 CLINICAL DATA:  Hypoxia. EXAM: PORTABLE CHEST 1 VIEW COMPARISON:  02/21/2019.  03/24/2015.  11/17/2014.  CT 11/17/2014. FINDINGS: Mediastinum hilar structures normal. Heart size normal. Bibasilar atelectasis. Questionable faint nodular density left upper lung. Nonenhanced chest CT suggested for further evaluation. No pleural effusion or pneumothorax. Old left lower rib fractures again noted. Old  nonunited left clavicular fracture noted. Degenerative change thoracic spine. Surgical clips left upper abdomen. IMPRESSION: 1. Questionable left upper lobe small pulmonary nodule cannot be excluded. Nonenhanced chest CT suggested for further evaluation. 2.  Bibasilar atelectasis. Electronically Signed   By: Marcello Moores  Register   On: 03/15/2019 10:29   Dg Shoulder Left  Result Date: 03/14/2019 CLINICAL DATA:  Fall.  Pain. EXAM: LEFT SHOULDER - 2+ VIEW COMPARISON:  CT 09/05/2018. FINDINGS: Acromioclavicular glenohumeral degenerative change. Old non unified fracture of the distal clavicle noted. Angulated old non healed fracture proximal left humerus. Proximal humerus consistent with non healed subcapital fracture. Angulated subcapital fracture of the left proximal humerus is noted on prior CT of 09/05/2018. Plate screw fixation distal humerus. No new abnormality identified. Negative. IMPRESSION: 1. Old non unified fracture left distal clavicle. Angulated old non healed fracture left proximal humerus. 2.  No acute abnormality. Electronically Signed   By: Marcello Moores  Register   On: 03/14/2019 15:17      Impression / Plan:   Edward Singleton is a 68 y.o. male with history of heavy alcohol use, diabetes, hypertension, history of gout presented to ER with history of multiple falls, lethargy found to have severe anemia and AKI. Patient does not have active GI bleed and denies any recent GI bleed.  His anemia is most likely secondary to combination of severe AKI, bone marrow suppression and malnutrition from heavy alcohol use.  Patient does not need urgent endoscopy at this time.  Would wait for him to recover from AKI.  Check iron studies, B12 and folate levels.  Adequate hydration and nutrition.  Continue Protonix 40 mg twice daily.  Abstinence from alcohol.  Monitor CBC closely and transfuse as needed. He can undergo upper endoscopy and colonoscopy after he recovers from acute kidney injury, prior to discharge.  Thank  you for involving me in the care of this patient. Will follow along with you    LOS: 1 day   Sherri Sear, MD  03/15/2019, 4:52 PM   Note: This dictation was prepared with Dragon dictation along with smaller phrase technology. Any transcriptional errors that result from this process are unintentional.

## 2019-03-15 NOTE — Consult Note (Signed)
[facility] Nurse wound consult note Reason for Consult: Partial thickness skin loss/abrasion noted on left hand and inner aspect of left forearm Wound type:trauma Pressure Injury POA: NA Measurement: inner aspect of left forearm: 10cm x 2.5cm x 0.2cm Left hand: affected area measures 3cm x 4cm with area of depth measuring 0.1cm Wound TNB:ZXYD, moist with sloughing epidermis at inner aspect of left forearm Drainage (amount, consistency, odor) small, serous Periwound: ecchymosis Dressing procedure/placement/frequency:I will implement a conservative POC to include washing the arm and hand with soap and water, then rinsing the wounds with NS. Topical care will be with an antimicrobial nonadherent dressing (xeroform gauze) that will be secured with a few turns of Kerlix roll gauze/paper tape. Dressings are to be changed by bedside RN once daily and PRN dressing dislodgement.  [facility] nursing team will not follow, but will remain available to this patient, the nursing and medical teams.  Please re-consult if needed. Thanks, Maudie Flakes, MSN, RN, [place], Arther Abbott  Pager# [phone]

## 2019-03-15 NOTE — Progress Notes (Addendum)
Pt wound care  was done on pt left arm. On assessment pt was really in so much pain while lifting his arm to have an access of his antebrachial wound on left arm. The area have some scab around it and some open area which had no drainage and no odor. The elbow part was swollen. After the dressing change the left arm was elevated with pillows. Will continue to monitor.  Update 0000: Pt was asleep and unable to do CIWA assessment. Will continue to monitor.

## 2019-03-15 NOTE — Progress Notes (Addendum)
[place] at [facility]                                                                                                                                                                                  Patient Demographics   Edward Singleton, is a 68 y.o. male, DOB - 02/21/1951, SWF:[id number]  Admit date - 03/14/2019   Admitting Physician Saundra Shelling, MD  Outpatient Primary MD for the patient is Windy Fast, MD   LOS - 1  Subjective: Patient with alcohol abuse admitted with falls noted to have acute renal failure Hyponatremia This morning also noticed to have hypotension Also noticed to have worsening anemia Also noticed to have hypoxia at the time of my visit Continues to drink  Review of Systems:   CONSTITUTIONAL: No documented fever.  Positive fatigue, positive weakness. No weight gain, no weight loss.  EYES: No blurry or double vision.  ENT: No tinnitus. No postnasal drip. No redness of the oropharynx.  RESPIRATORY: No cough, no wheeze, no hemoptysis. No dyspnea.  CARDIOVASCULAR: No chest pain. No orthopnea. No palpitations. No syncope.  GASTROINTESTINAL: No nausea, no vomiting or diarrhea. No abdominal pain. No melena or hematochezia.  GENITOURINARY: No dysuria or hematuria.  ENDOCRINE: No polyuria or nocturia. No heat or cold intolerance.  HEMATOLOGY: No anemia.  Positive bruising. No bleeding.  INTEGUMENTARY: No rashes. No lesions.  Positive bruising on his extremities MUSCULOSKELETAL: No arthritis. No swelling. No gout.  NEUROLOGIC: No numbness, tingling, or ataxia. No seizure-type activity.  PSYCHIATRIC: No anxiety. No insomnia. No ADD.    Vitals:   Vitals:   03/15/19 0922 03/15/19 0925 03/15/19 0946 03/15/19 1219  BP: (!) 86/51 (!) 76/50 (!) 83/49 94/60  Pulse:    70  Resp:      Temp:      TempSrc:      SpO2: (!) 84%  92% 94%  Weight:      Height:        Wt Readings from Last 3 Encounters:  03/14/19 61.4 kg  02/21/19 77.5 kg   09/26/18 70.3 kg     Intake/Output Summary (Last 24 hours) at 03/15/2019 1334 Last data filed at 03/15/2019 1248 Gross per 24 hour  Intake 1773.71 ml  Output 375 ml  Net 1398.71 ml    Physical Exam:   GENERAL:  ill-appearing HEAD, EYES, EARS, NOSE AND THROAT: Atraumatic, normocephalic. Extraocular muscles are intact. Pupils equal and reactive to light. Sclerae anicteric. No conjunctival injection. No oro-pharyngeal erythema.  NECK: Supple. There is no jugular venous distention. No bruits, no lymphadenopathy, no thyromegaly.  HEART: Regular rate and rhythm,. No murmurs, no rubs, no clicks.  LUNGS:  Diminished breath sound.  ABDOMEN: Soft, flat, nontender, nondistended. Has good bowel sounds. No hepatosplenomegaly appreciated.  EXTREMITIES: No bruising noted in his arms NEUROLOGIC: The patient is alert, awake, and oriented x3 with no focal motor or sensory deficits appreciated bilaterally.  SKIN: Moist and warm with no rashes appreciated.  Psych: Not anxious, depressed LN: No inguinal LN enlargement    Antibiotics   Anti-infectives (From admission, onward)   None      Medications   Scheduled Meds: . sodium chloride   Intravenous Once  . allopurinol  100 mg Oral Daily  . aspirin  81 mg Oral Daily  . cyanocobalamin  1,000 mcg Subcutaneous Daily  . escitalopram  10 mg Oral Daily  . folic acid  1 mg Oral Daily  . LORazepam  0-4 mg Intravenous Q6H   Followed by  . [START ON 03/16/2019] LORazepam  0-4 mg Intravenous Q12H  . midodrine  10 mg Oral TID WC  . multivitamin with minerals  1 tablet Oral Daily  . nicotine  21 mg Transdermal Daily  . pantoprazole (PROTONIX) IV  40 mg Intravenous Q12H  . rosuvastatin  20 mg Oral Daily  . thiamine  100 mg Oral Daily  . traZODone  50 mg Oral Daily   Continuous Infusions: . 0.9 % NaCl with KCl 20 mEq / L 100 mL/hr at 03/15/19 1301  . calcium gluconate    . potassium chloride 100 mL/hr at 03/15/19 1222   PRN Meds:.acetaminophen **OR**  acetaminophen, HYDROmorphone (DILAUDID) injection, LORazepam **OR** LORazepam, ondansetron **OR** ondansetron (ZOFRAN) IV, oxyCODONE-acetaminophen, senna-docusate   Data Review:   Micro Results Recent Results (from the past 240 hour(s))  SARS Coronavirus 2 (CEPHEID - Performed in [facility] hospital lab), Hosp Order     Status: None   Collection Time: 03/14/19  6:11 PM  Result Value Ref Range Status   SARS Coronavirus 2 NEGATIVE NEGATIVE Final    Comment: (NOTE) If result is NEGATIVE SARS-CoV-2 target nucleic acids are NOT DETECTED. The SARS-CoV-2 RNA is generally detectable in upper and lower  respiratory specimens during the acute phase of infection. The lowest  concentration of SARS-CoV-2 viral copies this assay can detect is 250  copies / mL. A negative result does not preclude SARS-CoV-2 infection  and should not be used as the sole basis for treatment or other  patient management decisions.  A negative result may occur with  improper specimen collection / handling, submission of specimen other  than nasopharyngeal swab, presence of viral mutation(s) within the  areas targeted by this assay, and inadequate number of viral copies  (<250 copies / mL). A negative result must be combined with clinical  observations, patient history, and epidemiological information. If result is POSITIVE SARS-CoV-2 target nucleic acids are DETECTED. The SARS-CoV-2 RNA is generally detectable in upper and lower  respiratory specimens dur ing the acute phase of infection.  Positive  results are indicative of active infection with SARS-CoV-2.  Clinical  correlation with patient history and other diagnostic information is  necessary to determine patient infection status.  Positive results do  not rule out bacterial infection or co-infection with other viruses. If result is PRESUMPTIVE POSTIVE SARS-CoV-2 nucleic acids MAY BE PRESENT.   A presumptive positive result was obtained on the submitted  specimen  and confirmed on repeat testing.  While 2019 novel coronavirus  (SARS-CoV-2) nucleic acids may be present in the submitted sample  additional confirmatory testing may be necessary for epidemiological  and / or clinical  management purposes  to differentiate between  SARS-CoV-2 and other Sarbecovirus currently known to infect humans.  If clinically indicated additional testing with an alternate test  methodology [phone]) is advised. The SARS-CoV-2 RNA is generally  detectable in upper and lower respiratory sp ecimens during the acute  phase of infection. The expected result is Negative. Fact Sheet for Patients:  StrictlyIdeas.no Fact Sheet for Healthcare Providers: BankingDealers.co.za This test is not yet approved or cleared by the [place] FDA and has been authorized for detection and/or diagnosis of SARS-CoV-2 by FDA under an Emergency Use Authorization (EUA).  This EUA will remain in effect (meaning this test can be used) for the duration of the COVID-19 declaration under Section 564(b)(1) of the Act, 21 U.S.C. section 360bbb-3(b)(1), unless the authorization is terminated or revoked sooner. Performed at [facility], [address] [place], [place] [address]     Radiology Reports Dg Elbow Complete Left  Result Date: 03/14/2019 CLINICAL DATA:  Left elbow pain after fall. EXAM: LEFT ELBOW - COMPLETE 3+ VIEW COMPARISON:  Radiographs of November 18, 2014. FINDINGS: Status post surgical internal fixation of old distal left humeral fracture. Heterotopic bone formation is noted. Degenerative changes are seen involving the joint spaces between the humerus and radius and ulna. No acute fracture or dislocation is noted. Probable soft tissue hematoma is noted overlying the proximal ulna. IMPRESSION: Extensive postsurgical and degenerative changes are noted as described above. Probable soft tissue hematoma seen overlying  proximal ulna. No acute fracture or dislocation is noted. Electronically Signed   By: Marijo Conception M.D.   On: 03/14/2019 15:17   Ct Head Wo Contrast  Result Date: 03/14/2019 CLINICAL DATA:  Posttraumatic headache and confusion after fall at home. EXAM: CT HEAD WITHOUT CONTRAST CT CERVICAL SPINE WITHOUT CONTRAST TECHNIQUE: Multidetector CT imaging of the head and cervical spine was performed following the standard protocol without intravenous contrast. Multiplanar CT image reconstructions of the cervical spine were also generated. COMPARISON:  CT scan of Feb 20, 2019. FINDINGS: CT HEAD FINDINGS Brain: Mild diffuse cortical atrophy is noted. Mild chronic ischemic white matter disease is noted. 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 No mass effect or midline shift is noted. Ventricular size is within normal limits. There is no evidence of mass lesion, hemorrhage or acute infarction. Vascular: No hyperdense vessel or unexpected calcification. Skull: Normal. Negative for fracture or focal lesion. Sinuses/Orbits: No acute finding. Other: None. CT CERVICAL SPINE FINDINGS Alignment: Grade 1 anterolisthesis of C4-5 is noted secondary to posterior facet joint hypertrophy. Skull base and vertebrae: No acute fracture. No primary bone lesion or focal pathologic process. Soft tissues and spinal canal: No prevertebral fluid or swelling. No visible canal hematoma. Disc levels: Severe degenerative disc disease is noted at C5-6 and C6-7 with anterior posterior osteophyte formation. Upper chest: Negative. Other: Degenerative changes are seen involving posterior facet joints bilaterally. IMPRESSION: Mild diffuse cortical atrophy. Mild chronic ischemic white matter disease. No acute intracranial abnormality seen. Multilevel degenerative disc disease. No acute abnormality seen in the cervical spine. Electronically Signed   By: Marijo Conception M.D.   On: 03/14/2019 15:32   Ct Head Wo  Contrast  Result Date: 02/20/2019 CLINICAL DATA:  Encephalopathy EXAM: CT HEAD WITHOUT CONTRAST TECHNIQUE: Contiguous axial images were obtained from the base of the skull through the vertex without intravenous contrast. COMPARISON:  CT head dated 03/24/1999 FINDINGS: Brain: There is extensive encephalomalacia involving the right temporoparietal region. There is a hypodensity  in the left parietal lobe, likely representing an old infarct. This is stable from prior CT. The right temporoparietal encephalomalacia is new from prior CT. Age related volume loss is noted. There is no midline shift. There is no intracranial hemorrhage. Vascular: No hyperdense vessel or unexpected calcification. Skull: Normal. Negative for fracture or focal lesion. Sinuses/Orbits: No acute finding. Other: None. IMPRESSION: 1. No acute intracranial abnormality. 2. 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 3. Age related volume loss with progression since 2016. Electronically Signed   By: Constance Holster M.D.   On: 02/20/2019 20:45   Ct Cervical Spine Wo Contrast  Result Date: 03/14/2019 CLINICAL DATA:  Posttraumatic headache and confusion after fall at home. EXAM: CT HEAD WITHOUT CONTRAST CT CERVICAL SPINE WITHOUT CONTRAST TECHNIQUE: Multidetector CT imaging of the head and cervical spine was performed following the standard protocol without intravenous contrast. Multiplanar CT image reconstructions of the cervical spine were also generated. COMPARISON:  CT scan of Feb 20, 2019. FINDINGS: CT HEAD FINDINGS Brain: Mild diffuse cortical atrophy is noted. Mild chronic ischemic white matter disease is noted. 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 No mass effect or midline shift is noted. Ventricular size is within normal limits. There is no evidence of mass lesion, hemorrhage or acute infarction. Vascular: No hyperdense vessel or unexpected calcification. Skull: Normal. Negative  for fracture or focal lesion. Sinuses/Orbits: No acute finding. Other: None. CT CERVICAL SPINE FINDINGS Alignment: Grade 1 anterolisthesis of C4-5 is noted secondary to posterior facet joint hypertrophy. Skull base and vertebrae: No acute fracture. No primary bone lesion or focal pathologic process. Soft tissues and spinal canal: No prevertebral fluid or swelling. No visible canal hematoma. Disc levels: Severe degenerative disc disease is noted at C5-6 and C6-7 with anterior posterior osteophyte formation. Upper chest: Negative. Other: Degenerative changes are seen involving posterior facet joints bilaterally. IMPRESSION: Mild diffuse cortical atrophy. Mild chronic ischemic white matter disease. No acute intracranial abnormality seen. Multilevel degenerative disc disease. No acute abnormality seen in the cervical spine. Electronically Signed   By: Marijo Conception M.D.   On: 03/14/2019 15:32   US Renal  Result Date: 03/15/2019 CLINICAL DATA:  Acute renal injury. EXAM: RENAL / URINARY TRACT ULTRASOUND COMPLETE COMPARISON:  None. FINDINGS: Right Kidney: Renal measurements: 11.5 x 6.0 x 5.8 cm = volume: 213 mL . Echogenicity within normal limits. No mass or hydronephrosis visualized. Left Kidney: Renal measurements: 9.1 x 3.6 by 4.3 cm = volume: 75 mL. Increased cortical echogenicity. No hydronephrosis. 4 cm anechoic cyst associated with the LEFT kidney appears benign. Bladder: Appears normal for degree of bladder distention. IMPRESSION: 1. Normal RIGHT kidney.  No hydronephrosis. 2. Atrophic LEFT kidney.  No hydronephrosis. Electronically Signed   By: Suzy Bouchard M.D.   On: 03/15/2019 12:13   Dg Chest Port 1 View  Result Date: 03/15/2019 CLINICAL DATA:  Hypoxia. EXAM: PORTABLE CHEST 1 VIEW COMPARISON:  02/21/2019.  03/24/2015.  11/17/2014.  CT 11/17/2014. FINDINGS: Mediastinum hilar structures normal. Heart size normal. Bibasilar atelectasis. Questionable faint nodular density left upper lung. Nonenhanced  chest CT suggested for further evaluation. No pleural effusion or pneumothorax. Old left lower rib fractures again noted. Old nonunited left clavicular fracture noted. Degenerative change thoracic spine. Surgical clips left upper abdomen. IMPRESSION: 1. Questionable left upper lobe small pulmonary nodule cannot be excluded. Nonenhanced chest CT suggested for further evaluation. 2.  Bibasilar atelectasis. Electronically Signed   By: Marcello Moores  Register  On: 03/15/2019 10:29   Dg Chest Port 1 View  Result Date: 02/21/2019 CLINICAL DATA:  Tachypnea. Question aspiration in the setting of ETOH intoxication. EXAM: PORTABLE CHEST 1 VIEW COMPARISON:  Chest radiograph 03/24/2015 FINDINGS: Normal heart size and mediastinal contours. Mild bronchial and interstitial coarsening without focal airspace disease. No pleural effusion or pneumothorax. No pulmonary edema. Remote left proximal humerus fracture. IMPRESSION: Mild bronchial and interstitial coarsening without focal airspace disease. Electronically Signed   By: Keith Rake M.D.   On: 02/21/2019 03:49   Dg Shoulder Left  Result Date: 03/14/2019 CLINICAL DATA:  Fall.  Pain. EXAM: LEFT SHOULDER - 2+ VIEW COMPARISON:  CT 09/05/2018. FINDINGS: Acromioclavicular glenohumeral degenerative change. Old non unified fracture of the distal clavicle noted. Angulated old non healed fracture proximal left humerus. Proximal humerus consistent with non healed subcapital fracture. Angulated subcapital fracture of the left proximal humerus is noted on prior CT of 09/05/2018. Plate screw fixation distal humerus. No new abnormality identified. Negative. IMPRESSION: 1. Old non unified fracture left distal clavicle. Angulated old non healed fracture left proximal humerus. 2.  No acute abnormality. Electronically Signed   By: Marcello Moores  Register   On: 03/14/2019 15:17     CBC Recent Labs  Lab 03/14/19 1506 03/14/19 2210 03/15/19 0530  WBC 18.7*  --  14.9*  HGB 9.5* 7.2* 7.1*  HCT  26.7* 20.7* 20.6*  PLT 303  --  232  MCV 96.4  --  98.6  MCH 34.3*  --  34.0  MCHC 35.6  --  34.5  RDW 14.6  --  15.0  LYMPHSABS 1.1  --   --   MONOABS 1.9*  --   --   EOSABS 0.0  --   --   BASOSABS 0.1  --   --     Chemistries  Recent Labs  Lab 03/14/19 1506 03/14/19 2017 03/15/19 0530  NA 129*  --  130*  K 2.9*  --  3.0*  CL 88*  --  97*  CO2 16*  --  17*  GLUCOSE 121*  --  148*  BUN 94*  --  90*  CREATININE 5.83*  --  4.89*  CALCIUM 9.1  --  7.9*  MG  --  1.6* 1.7  AST 23  --   --   ALT 15  --   --   ALKPHOS 139*  --   --   BILITOT 1.0  --   --    ------------------------------------------------------------------------------------------------------------------ estimated creatinine clearance is 12.9 mL/min (A) (by C-G formula based on SCr of 4.89 mg/dL (H)). ------------------------------------------------------------------------------------------------------------------ No results for input(s): HGBA1C in the last 72 hours. ------------------------------------------------------------------------------------------------------------------ No results for input(s): CHOL, HDL, LDLCALC, TRIG, CHOLHDL, LDLDIRECT in the last 72 hours. ------------------------------------------------------------------------------------------------------------------ No results for input(s): TSH, T4TOTAL, T3FREE, THYROIDAB in the last 72 hours.  Invalid input(s): FREET3 ------------------------------------------------------------------------------------------------------------------ Recent Labs    03/15/19 0856  VITAMINB12 163*  FOLATE 41.0  FERRITIN 135  TIBC 262  IRON 34*  RETICCTPCT 4.2*    Coagulation profile No results for input(s): INR, PROTIME in the last 168 hours.  No results for input(s): DDIMER in the last 72 hours.  Cardiac Enzymes Recent Labs  Lab 03/14/19 1506  TROPONINI <0.03    ------------------------------------------------------------------------------------------------------------------ Invalid input(s): POCBNP    Assessment & Plan   IMPRESSION AND PLAN: 68 year old male patient with a known history of alcohol abuse, prostate hypertrophy, alcoholic dementia, type 2 diabetes mellitus, gout, hyperlipidemia, hypertension, nephrolithiasis presented to the emergency room for frequent falls.    -  Hypotension suspect this is due to severe dehydration I have asked the nurse to bolus him with 1 L fluid Also I will start the patient on midodrine Also anemia is playing a role in his hypotension We will transfuse him as well  -Acute hypokalemia Replace potassium orally and intravenously Due to low magnesium is being replaced   -Acute renal failure we will give IV fluids monitor renal function I have asked nephrology to see renal ultrasound will be ordered  -Anemia Possibly due to acute GI bleed patient will need a EGD and a colonoscopy once stable from his renal failure standpoint  -Hypoxia chest x-ray ordered   -Frequent falls and gait instability Secondary to alcoholic encephalopathy and dementia Physical therapy evaluation once clinically stable gait and balance training Will assess for Wernicke's encephalopathy  -Alcohol abuse CIWA protocol Thiamine folic acid supplements  -Type 2 diabetes mellitus Sliding scale coverage with insulin  -Hyponatremia IV fluid hydration follow sodium levels  -Leukocytosis Stress-induced WBCs trending down  -Alcoholic ketoacidosis Continue IV fluid  -Tobacco abuse T recommended to stop smoking     Code Status Orders  (From admission, onward)         Start     Ordered   03/14/19 1944  Full code  Continuous     03/14/19 1943        Code Status History    Date Active Date Inactive Code Status Order ID Comments User Context   02/21/2019 0321 02/23/2019 1936 Full Code [id number]  Lance Coon, MD Inpatient   02/21/2019 0045 02/21/2019 0320 Full Code [id number]  Gregor Hams, MD ED   07/23/2015 0231 07/27/2015 2114 Full Code [id number]  Fritzi Mandes, MD Inpatient   11/25/2014 1510 12/08/2014 1650 Full Code [id number]  Cathlyn Parsons, PA-C Inpatient   11/25/2014 1510 11/25/2014 1510 Full Code [id number]  Cathlyn Parsons, PA-C Inpatient   11/17/2014 1518 11/25/2014 1510 Full Code [id number]  Lisette Abu, PA-C Inpatient    Advance Directive Documentation     Most Recent Value  Type of Advance Directive  Healthcare Power of Attorney  Pre-existing out of facility DNR order (yellow form or pink MOST form)  -  "MOST" Form in Place?  -           Consults nephrology GI   DVT Prophylaxis SCDs Lab Results  Component Value Date   PLT 232 03/15/2019     Time Spent in minutes 35-minute of critical care time patient with hypotension and severe anemia high risk of cardiopulmonary arrest greater than 50% of time spent in care coordination and counseling patient regarding the condition and plan of care.   Dustin Flock M.D on 03/15/2019 at 1:34 PM  Between 7am to 6pm - Pager - [phone]  After 6pm go to [email or web address] - Proofreader  [employer]   Office  [phone]

## 2019-03-15 NOTE — Consult Note (Addendum)
PHARMACY CONSULT NOTE - FOLLOW UP  Pharmacy Consult for Electrolyte Monitoring and Replacement   Recent Labs: Potassium (mmol/L)  Date Value  03/15/2019 3.0 (L)  01/16/2014 5.0   Magnesium (mg/dL)  Date Value  03/15/2019 1.7  01/15/2014 1.2 (L)   Calcium (mg/dL)  Date Value  03/15/2019 7.9 (L)   Calcium, Total (mg/dL)  Date Value  01/16/2014 8.5   Albumin (g/dL)  Date Value  03/14/2019 4.2  10/16/2015 4.7   Sodium (mmol/L)  Date Value  03/15/2019 130 (L)  08/11/2015 136 (A)  01/16/2014 139     Assessment: Pt presented with known history of alcohol abuse, and had a fall. K+ 3 only received KCl IV 10 mEq x 1 per MAR, no PO KCL given. Mg 1.7. Ca is 7.9 with a albumin of 4.2. Pt is on 0.9 NS w/ KCl 20 mEq infusion.   Goal of Therapy:  Electrolytes WNL.   Plan:  Will give patient KCl 10 mEq x 4, Mg 2 g x 1, Ca gluconate 1 g x 1. Recheck levels with AM labs.   Oswald Hillock ,PharmD, BCPS Clinical Pharmacist 03/15/2019 8:22 AM

## 2019-03-15 NOTE — TOC Initial Note (Addendum)
Transition of Care [facility]) - Initial/Assessment Note    Patient Details  Name: Edward Singleton MRN: [id number] Date of Birth: May 20, 1951  Transition [facility]) CM/SW Contact:    Ross Ludwig, LCSW Phone Number: 03/15/2019, 5:23 PM  Clinical Narrative:                  Patient is a 68 year old male who is alert and oriented x4.  Patient lives alone, and states he feels like he needs to go to SNF for short term rehab.  Patient stated that his family and APS are working on trying to find him an ALF to move into after he goes to SNF.  Patient has an [place], [phone] or [phone] following patient.  She informed CSW that they are trying to pursue guardianship, and are requesting psych to see him to see if he has capacity to make his own decisions.  APS worker needs to updated on discharge plan for patient.  Patient plans to go to ALF after going to SNF for short term rehab.  CSW requested PT to be ordered so he can been seen for SNF placement.  Patient states he has been at [facility] in the past, and he is okay with returning back if they offer a bed for him.  Patient did not express any other questions or concerns, he gave CSW permission to begin bed search in [facility].  Patient did not express any trouble getting his medications or getting to his MD appointments.   Expected Discharge Plan: Skilled Nursing Facility Barriers to Discharge: Continued Medical Work up   Patient Goals and CMS Choice Patient states their goals for this hospitalization and ongoing recovery are:: Patient states he wants to go to SNF for short term rehab, and then hopefully transition to an ALF. CMS Medicare.gov Compare Post Acute Care list provided to:: Patient Choice offered to / list presented to : Patient, [facility] POA / Guardian  Expected Discharge Plan and Services Expected Discharge Plan: [place] In-house Referral: NA   Post Acute Care Choice: [place] Living arrangements for the past 2 months: Mobile Home                 DME Arranged: N/A DME Agency: NA       HH Arranged: NA HH Agency: NA        Prior Living Arrangements/Services Living arrangements for the past 2 months: Mobile Home Lives with:: Self Patient language and need for interpreter reviewed:: Yes Do you feel safe going back to the place where you live?: No   Patient feels he needs some rehab before he is able to return back home.  Need for Family Participation in Patient Care: Yes (Comment) Care giver support system in place?: Yes (comment)   Criminal Activity/Legal Involvement Pertinent to Current Situation/Hospitalization: No - Comment as needed  Activities of Daily Living Home Assistive Devices/Equipment: Cane (specify quad or straight)(wooden cane) ADL Screening (condition at time of admission) Patient's cognitive ability adequate to safely complete daily activities?: Yes Is the patient deaf or have difficulty hearing?: No Does the patient have difficulty seeing, even when wearing glasses/contacts?: No(reading glases wear sometimes) Does the patient have difficulty concentrating, remembering, or making decisions?: No Patient able to express need for assistance with ADLs?: Yes Does the patient have difficulty dressing or bathing?: No Independently performs ADLs?: Yes (appropriate for developmental age) Does the patient have difficulty walking or climbing  stairs?: Yes Weakness of Legs: None Weakness of Arms/Hands: None  Permission Sought/Granted Permission sought to share information with : Facility Sport and exercise psychologist, Family Supports, Other (comment)([place] APS) Permission granted to share information with : Yes, Verbal Permission Granted  Share Information with NAME: Bullis,Jill Sister   [phone] or Klotz,Vickey Sister   [phone]   Permission granted to share info w AGENCY: [place], SNF admissions         Emotional Assessment Appearance:: Appears older than stated age   Affect (typically observed): Calm, Appropriate, Stable, Pleasant Orientation: : Oriented to Self, Oriented to Place, Oriented to  Time, Oriented to Situation Alcohol / Substance Use: Alcohol Use, Tobacco Use Psych Involvement: No (comment)  Admission diagnosis:  ETOH abuse [F10.10] Acute upper GI bleed [K92.2] Multiple abrasions [T07.XXXA] Fall, initial encounter [id number]] Patient Active Problem List   Diagnosis Date Noted  . Hypokalemia 03/14/2019  . Alcoholic ketoacidosis 41/96/2229  . Alcohol withdrawal syndrome with complication ([place]) 79/89/2119  . Diabetes ([place]) 02/21/2019  . Left rotator cuff tear arthropathy 12/28/2015  . Hyperlipidemia 12/24/2015  . Tobacco abuse 11/06/2015  . Mood disturbance 11/06/2015  . CAD (coronary artery disease) 10/16/2015  . PAD (peripheral artery disease) ([place]) 10/16/2015  . Preventative health care 08/13/2015  . Renal artery stenosis ([place]) 07/30/2015  . Acute on chronic renal failure ([place]) 07/30/2015  . Kidney atrophy 07/30/2015  . CKD (chronic kidney disease), stage III ([place]) 07/30/2015  . Insomnia 07/22/2015  . History of substance abuse ([place]) 07/15/2015  . BPH (benign prostatic hyperplasia) 07/15/2015  . Gout 07/15/2015  . Kidney stones 05/11/2015  . 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 HTN (hypertension) 11/17/2014  . Alcohol abuse 11/17/2014   PCP:  Windy Fast, MD Pharmacy:   [id number] [place], [address] [place] [address] Phone: [phone] Fax: [place], [place] - [place] [phone] [place] [address] Phone: [phone] Fax: [phone]     Social Determinants of Health (SDOH) Interventions    Readmission Risk Interventions No flowsheet data found.

## 2019-03-16 LAB — CBC WITH DIFFERENTIAL/PLATELET
Abs Immature Granulocytes: 0.1 10*3/uL — ABNORMAL HIGH (ref 0.00–0.07)
Basophils Absolute: 0 10*3/uL (ref 0.0–0.1)
Basophils Relative: 0 %
Eosinophils Absolute: 0.2 10*3/uL (ref 0.0–0.5)
Eosinophils Relative: 1 %
HCT: 23.1 % — ABNORMAL LOW (ref 39.0–52.0)
Hemoglobin: 7.8 g/dL — ABNORMAL LOW (ref 13.0–17.0)
Immature Granulocytes: 1 %
Lymphocytes Relative: 10 %
Lymphs Abs: 1.3 10*3/uL (ref 0.7–4.0)
MCH: 34.4 pg — ABNORMAL HIGH (ref 26.0–34.0)
MCHC: 33.8 g/dL (ref 30.0–36.0)
MCV: 101.8 fL — ABNORMAL HIGH (ref 80.0–100.0)
Monocytes Absolute: 1.8 10*3/uL — ABNORMAL HIGH (ref 0.1–1.0)
Monocytes Relative: 13 %
Neutro Abs: 10.3 10*3/uL — ABNORMAL HIGH (ref 1.7–7.7)
Neutrophils Relative %: 75 %
Platelets: 215 10*3/uL (ref 150–400)
RBC: 2.27 MIL/uL — ABNORMAL LOW (ref 4.22–5.81)
RDW: 15.9 % — ABNORMAL HIGH (ref 11.5–15.5)
WBC: 13.7 10*3/uL — ABNORMAL HIGH (ref 4.0–10.5)
nRBC: 1 % — ABNORMAL HIGH (ref 0.0–0.2)

## 2019-03-16 LAB — BASIC METABOLIC PANEL
Anion gap: 7 (ref 5–15)
BUN: 64 mg/dL — ABNORMAL HIGH (ref 8–23)
CO2: 17 mmol/L — ABNORMAL LOW (ref 22–32)
Calcium: 7.9 mg/dL — ABNORMAL LOW (ref 8.9–10.3)
Chloride: 109 mmol/L (ref 98–111)
Creatinine, Ser: 3.88 mg/dL — ABNORMAL HIGH (ref 0.61–1.24)
GFR calc Af Amer: 18 mL/min — ABNORMAL LOW (ref >60)
GFR calc non Af Amer: 15 mL/min — ABNORMAL LOW (ref >60)
Glucose, Bld: 123 mg/dL — ABNORMAL HIGH (ref 70–99)
Potassium: 4.2 mmol/L (ref 3.5–5.1)
Sodium: 133 mmol/L — ABNORMAL LOW (ref 135–145)

## 2019-03-16 LAB — TYPE AND SCREEN
ABO/RH(D): A POS
Antibody Screen: NEGATIVE
Unit division: 0

## 2019-03-16 LAB — PHOSPHORUS: Phosphorus: 3.1 mg/dL (ref 2.5–4.6)

## 2019-03-16 LAB — BPAM RBC
Blood Product Expiration Date: 202006182359
ISSUE DATE / TIME: 202006051405
Unit Type and Rh: 6200

## 2019-03-16 LAB — MAGNESIUM: Magnesium: 1.9 mg/dL (ref 1.7–2.4)

## 2019-03-16 NOTE — Progress Notes (Addendum)
PT IV site was really red and swollen. IV was leaking and removed. Pt also have a wound from the fall on the site which was clean and placed mepitel on. IV team was consulted to place a new one. Pt left arm still swollen and was elevated by pillow. Wound care was done by day shift.  Will continue to monitor.

## 2019-03-16 NOTE — Progress Notes (Signed)
Edward Singleton, [place] 03/16/19  Subjective:   Patient is doing fair.  No acute complaints.  Very sleepy this morning.  Patient has some confusion this morning.   Serum creatinine has improved to 3.9.  Objective:  Vital signs in last 24 hours:  Temp:  [97.5 F (36.4 C)-98.2 F (36.8 C)] 97.8 F (36.6 C) (06/06 0723) Pulse Rate:  [67-100] 100 (06/06 1100) Resp:  [18-20] 19 (06/06 0723) BP: (80-122)/(52-75) 102/67 (06/06 1100) SpO2:  [81 %-98 %] 91 % (06/06 0723) Weight:  [61.4 kg] 61.4 kg (06/06 0421)  Weight change: -11.2 kg Filed Weights   03/14/19 1447 03/14/19 1951 03/16/19 0421  Weight: 72.6 kg 61.4 kg 61.4 kg    Intake/Output:    Intake/Output Summary (Last 24 hours) at 03/16/2019 1212 Last data filed at 03/16/2019 1100 Gross per 24 hour  Intake 4335.25 ml  Output 1620 ml  Net 2715.25 ml     Physical Exam: General:  No acute distress, laying in the bed  HEENT  anicteric, moist oral mucous membranes  Neck  supple  Pulm/lungs  breathing comfortably on room air, no crackles  CVS/Heart  no rub or gallop  Abdomen:   Soft, nontender  Extremities:  Trace edema  Neurologic:  Alert, able to follow simple commands  Skin:  Normal turgor    Basic Metabolic Panel:  Recent Labs  Lab 03/14/19 1506 03/14/19 2017 03/15/19 0530 03/16/19 0425  NA 129*  --  130* 133*  K 2.9*  --  3.0* 4.2  CL 88*  --  97* 109  CO2 16*  --  17* 17*  GLUCOSE 121*  --  148* 123*  BUN 94*  --  90* 64*  CREATININE 5.83*  --  4.89* 3.88*  CALCIUM 9.1  --  7.9* 7.9*  MG  --  1.6* 1.7 1.9  PHOS  --   --   --  3.1     CBC: Recent Labs  Lab 03/14/19 1506 03/14/19 2210 03/15/19 0530 03/15/19 1913 03/16/19 0425  WBC 18.7*  --  14.9*  --  13.7*  NEUTROABS 15.4*  --   --   --  10.3*  HGB 9.5* 7.2* 7.1* 8.0* 7.8*  HCT 26.7* 20.7* 20.6* 24.3* 23.1*  MCV 96.4  --  98.6  --  101.8*  PLT 303  --  232  --  215     No results found for: HEPBSAG, HEPBSAB,  HEPBIGM    Microbiology:  Recent Results (from the past 240 hour(s))  SARS Coronavirus 2 (CEPHEID - Performed in [place] hospital lab), Hosp Order     Status: None   Collection Time: 03/14/19  6:11 PM  Result Value Ref Range Status   SARS Coronavirus 2 NEGATIVE NEGATIVE Final    Comment: (NOTE) If result is NEGATIVE SARS-CoV-2 target nucleic acids are NOT DETECTED. The SARS-CoV-2 RNA is generally detectable in upper and lower  respiratory specimens during the acute phase of infection. The lowest  concentration of SARS-CoV-2 viral copies this assay can detect is 250  copies / mL. A negative result does not preclude SARS-CoV-2 infection  and should not be used as the sole basis for treatment or other  patient management decisions.  A negative result may occur with  improper specimen collection / handling, submission of specimen other  than nasopharyngeal swab, presence of viral mutation(s) within the  areas targeted by this assay, and inadequate number of viral copies  (<250 copies / mL). A  negative result must be combined with clinical  observations, patient history, and epidemiological information. If result is POSITIVE SARS-CoV-2 target nucleic acids are DETECTED. The SARS-CoV-2 RNA is generally detectable in upper and lower  respiratory specimens dur ing the acute phase of infection.  Positive  results are indicative of active infection with SARS-CoV-2.  Clinical  correlation with patient history and other diagnostic information is  necessary to determine patient infection status.  Positive results do  not rule out bacterial infection or co-infection with other viruses. If result is PRESUMPTIVE POSTIVE SARS-CoV-2 nucleic acids MAY BE PRESENT.   A presumptive positive result was obtained on the submitted specimen  and confirmed on repeat testing.  While 2019 novel coronavirus  (SARS-CoV-2) nucleic acids may be present in the submitted sample  additional confirmatory  testing may be necessary for epidemiological  and / or clinical management purposes  to differentiate between  SARS-CoV-2 and other Sarbecovirus currently known to infect humans.  If clinically indicated additional testing with an alternate test  methodology [phone]) is advised. The SARS-CoV-2 RNA is generally  detectable in upper and lower respiratory sp ecimens during the acute  phase of infection. The expected result is Negative. Fact Sheet for Patients:  StrictlyIdeas.no Fact Sheet for Healthcare Providers: BankingDealers.co.za This test is not yet approved or cleared by the [place] FDA and has been authorized for detection and/or diagnosis of SARS-CoV-2 by FDA under an Emergency Use Authorization (EUA).  This EUA will remain in effect (meaning this test can be used) for the duration of the COVID-19 declaration under Section 564(b)(1) of the Act, 21 U.S.C. section 360bbb-3(b)(1), unless the authorization is terminated or revoked sooner. Performed at [facility], [address] [place], [place] [address]     Coagulation Studies: No results for input(s): LABPROT, INR in the last 72 hours.  Urinalysis: No results for input(s): COLORURINE, LABSPEC, PHURINE, GLUCOSEU, HGBUR, BILIRUBINUR, KETONESUR, PROTEINUR, UROBILINOGEN, NITRITE, LEUKOCYTESUR in the last 72 hours.  Invalid input(s): APPERANCEUR    Imaging: Dg Elbow Complete Left  Result Date: 03/14/2019 CLINICAL DATA:  Left elbow pain after fall. EXAM: LEFT ELBOW - COMPLETE 3+ VIEW COMPARISON:  Radiographs of November 18, 2014. FINDINGS: Status post surgical internal fixation of old distal left humeral fracture. Heterotopic bone formation is noted. Degenerative changes are seen involving the joint spaces between the humerus and radius and ulna. No acute fracture or dislocation is noted. Probable soft tissue hematoma is noted overlying the proximal ulna. IMPRESSION:  Extensive postsurgical and degenerative changes are noted as described above. Probable soft tissue hematoma seen overlying proximal ulna. No acute fracture or dislocation is noted. Electronically Signed   By: Marijo Conception M.D.   On: 03/14/2019 15:17   Ct Head Wo Contrast  Result Date: 03/14/2019 CLINICAL DATA:  Posttraumatic headache and confusion after fall at home. EXAM: CT HEAD WITHOUT CONTRAST CT CERVICAL SPINE WITHOUT CONTRAST TECHNIQUE: Multidetector CT imaging of the head and cervical spine was performed following the standard protocol without intravenous contrast. Multiplanar CT image reconstructions of the cervical spine were also generated. COMPARISON:  CT scan of Feb 20, 2019. FINDINGS: CT HEAD FINDINGS Brain: Mild diffuse cortical atrophy is noted. Mild chronic ischemic white matter disease is noted. 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 No mass effect or midline shift is noted. Ventricular size is within normal limits. There is no evidence of mass lesion, hemorrhage or acute infarction. Vascular: No hyperdense vessel or unexpected calcification. Skull: Normal. Negative  for fracture or focal lesion. Sinuses/Orbits: No acute finding. Other: None. CT CERVICAL SPINE FINDINGS Alignment: Grade 1 anterolisthesis of C4-5 is noted secondary to posterior facet joint hypertrophy. Skull base and vertebrae: No acute fracture. No primary bone lesion or focal pathologic process. Soft tissues and spinal canal: No prevertebral fluid or swelling. No visible canal hematoma. Disc levels: Severe degenerative disc disease is noted at C5-6 and C6-7 with anterior posterior osteophyte formation. Upper chest: Negative. Other: Degenerative changes are seen involving posterior facet joints bilaterally. IMPRESSION: Mild diffuse cortical atrophy. Mild chronic ischemic white matter disease. No acute intracranial abnormality seen. Multilevel degenerative disc disease. No acute  abnormality seen in the cervical spine. Electronically Signed   By: Marijo Conception M.D.   On: 03/14/2019 15:32   Ct Cervical Spine Wo Contrast  Result Date: 03/14/2019 CLINICAL DATA:  Posttraumatic headache and confusion after fall at home. EXAM: CT HEAD WITHOUT CONTRAST CT CERVICAL SPINE WITHOUT CONTRAST TECHNIQUE: Multidetector CT imaging of the head and cervical spine was performed following the standard protocol without intravenous contrast. Multiplanar CT image reconstructions of the cervical spine were also generated. COMPARISON:  CT scan of Feb 20, 2019. FINDINGS: CT HEAD FINDINGS Brain: Mild diffuse cortical atrophy is noted. Mild chronic ischemic white matter disease is noted. 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 No mass effect or midline shift is noted. Ventricular size is within normal limits. There is no evidence of mass lesion, hemorrhage or acute infarction. Vascular: No hyperdense vessel or unexpected calcification. Skull: Normal. Negative for fracture or focal lesion. Sinuses/Orbits: No acute finding. Other: None. CT CERVICAL SPINE FINDINGS Alignment: Grade 1 anterolisthesis of C4-5 is noted secondary to posterior facet joint hypertrophy. Skull base and vertebrae: No acute fracture. No primary bone lesion or focal pathologic process. Soft tissues and spinal canal: No prevertebral fluid or swelling. No visible canal hematoma. Disc levels: Severe degenerative disc disease is noted at C5-6 and C6-7 with anterior posterior osteophyte formation. Upper chest: Negative. Other: Degenerative changes are seen involving posterior facet joints bilaterally. IMPRESSION: Mild diffuse cortical atrophy. Mild chronic ischemic white matter disease. No acute intracranial abnormality seen. Multilevel degenerative disc disease. No acute abnormality seen in the cervical spine. Electronically Signed   By: Marijo Conception M.D.   On: 03/14/2019 15:32   US Renal  Result Date:  03/15/2019 CLINICAL DATA:  Acute renal injury. EXAM: RENAL / URINARY TRACT ULTRASOUND COMPLETE COMPARISON:  None. FINDINGS: Right Kidney: Renal measurements: 11.5 x 6.0 x 5.8 cm = volume: 213 mL . Echogenicity within normal limits. No mass or hydronephrosis visualized. Left Kidney: Renal measurements: 9.1 x 3.6 by 4.3 cm = volume: 75 mL. Increased cortical echogenicity. No hydronephrosis. 4 cm anechoic cyst associated with the LEFT kidney appears benign. Bladder: Appears normal for degree of bladder distention. IMPRESSION: 1. Normal RIGHT kidney.  No hydronephrosis. 2. Atrophic LEFT kidney.  No hydronephrosis. Electronically Signed   By: Suzy Bouchard M.D.   On: 03/15/2019 12:13   Dg Chest Port 1 View  Result Date: 03/15/2019 CLINICAL DATA:  Hypoxia. EXAM: PORTABLE CHEST 1 VIEW COMPARISON:  02/21/2019.  03/24/2015.  11/17/2014.  CT 11/17/2014. FINDINGS: Mediastinum hilar structures normal. Heart size normal. Bibasilar atelectasis. Questionable faint nodular density left upper lung. Nonenhanced chest CT suggested for further evaluation. No pleural effusion or pneumothorax. Old left lower rib fractures again noted. Old nonunited left clavicular fracture noted. Degenerative change thoracic spine. Surgical clips left upper abdomen. IMPRESSION: 1. Questionable left upper  lobe small pulmonary nodule cannot be excluded. Nonenhanced chest CT suggested for further evaluation. 2.  Bibasilar atelectasis. Electronically Signed   By: Marcello Moores  Register   On: 03/15/2019 10:29   Dg Shoulder Left  Result Date: 03/14/2019 CLINICAL DATA:  Fall.  Pain. EXAM: LEFT SHOULDER - 2+ VIEW COMPARISON:  CT 09/05/2018. FINDINGS: Acromioclavicular glenohumeral degenerative change. Old non unified fracture of the distal clavicle noted. Angulated old non healed fracture proximal left humerus. Proximal humerus consistent with non healed subcapital fracture. Angulated subcapital fracture of the left proximal humerus is noted on prior CT of  09/05/2018. Plate screw fixation distal humerus. No new abnormality identified. Negative. IMPRESSION: 1. Old non unified fracture left distal clavicle. Angulated old non healed fracture left proximal humerus. 2.  No acute abnormality. Electronically Signed   By: [place]   On: 03/14/2019 15:17     Medications:   . sodium chloride Stopped (03/15/19 1857)  . 0.9 % NaCl with KCl 20 mEq / L 100 mL/hr at 03/16/19 0145  . [START ON 03/18/2019] thiamine injection    . thiamine injection 500 mg (03/16/19 0959)   . allopurinol  100 mg Oral Daily  . aspirin  81 mg Oral Daily  . cyanocobalamin  1,000 mcg Subcutaneous Daily  . escitalopram  10 mg Oral Daily  . feeding supplement (ENSURE ENLIVE)  237 mL Oral TID BM  . folic acid  1 mg Oral Daily  . LORazepam  0-4 mg Intravenous Q6H   Followed by  . LORazepam  0-4 mg Intravenous Q12H  . midodrine  10 mg Oral TID WC  . multivitamin with minerals  1 tablet Oral Daily  . nicotine  21 mg Transdermal Daily  . pantoprazole (PROTONIX) IV  40 mg Intravenous Q12H  . rosuvastatin  20 mg Oral Daily  . [START ON 03/23/2019] thiamine  100 mg Oral Daily  . traZODone  50 mg Oral Daily  . vitamin C  500 mg Oral BID   sodium chloride, acetaminophen **OR** acetaminophen, HYDROmorphone (DILAUDID) injection, LORazepam **OR** LORazepam, ondansetron **OR** ondansetron (ZOFRAN) IV, oxyCODONE-acetaminophen, senna-docusate  Assessment/ Plan:  68 y.o. male with alcohol abuse, atrophic left kidney, history of BPH, diabetes, gout, hypertension, history of kidney stones, coronary artery disease and atherosclerosis noted on CT abdomen,, was admitted on 03/14/2019 after he was found down from a fall.   1.  Acute kidney injury Patient is nonoliguric.  Serum creatinine at admission was 5.8 today, has improved to 4.9 and further dwn to 3.9 with IV hydration.  Renal ultrasound shows atrophic left kidney and normal right kidney.  No kidney stones or obstruction.  Continue IV  hydration.  We will continue to monitor  2.  Chronic kidney disease stage III.  With atrophic left kidney and history of multiple kidney stones and history of lithotripsy Baseline creatinine 1.5/GFR 47 Hematuria and proteinuria noted on previous urinalysis We will repeat  3.  Diabetes type 2 with chronic kidney disease Lab Results  Component Value Date   HGBA1C 5.6 01/15/2014   Obtain recent hemoglobin A1c  4.  History of alcohol abuse Withdrawal protocol and vitamin supplementation as per primary team     LOS: [place] 6/6/202012:12 PM  [place], [place]  Note: This note was prepared with Dragon dictation. Any transcription errors are unintentional

## 2019-03-16 NOTE — Progress Notes (Addendum)
[facility] [place] NAME: Edward Singleton    MR#:  [id number]  DATE OF BIRTH:  17-Sep-1951  SUBJECTIVE:   Chief Complaint  Patient presents with   Fall   Alcohol Problem  Patient seen at the bedside.  He complains of pain in his left shoulder rating 10 out of 10 on a pain intensity rating scale.  No other complaints.  REVIEW OF SYSTEMS:  Review of Systems  Constitutional: Positive for weight loss. Negative for chills, fever and malaise/fatigue.  HENT: Negative for congestion, hearing loss and sore throat.   Eyes: Negative for blurred vision and double vision.  Respiratory: Negative for cough, shortness of breath and wheezing.   Cardiovascular: Negative for chest pain, palpitations, orthopnea and leg swelling.  Gastrointestinal: Negative for abdominal pain, diarrhea, nausea and vomiting.  Genitourinary: Negative for dysuria and urgency.  Musculoskeletal: Positive for falls and joint pain. Negative for myalgias.  Skin: Negative for rash.       Multiple bruising in upper and lower extremity  Neurological: Negative for dizziness, sensory change, speech change, focal weakness and headaches.  Endo/Heme/Allergies: Bruises/bleeds easily.  Psychiatric/Behavioral: Positive for memory loss and substance abuse. Negative for depression. The patient has insomnia.    DRUG ALLERGIES:  No Known Allergies VITALS:  Blood pressure 102/67, pulse 100, temperature 97.8 F (36.6 C), temperature source Oral, resp. rate 67, height 5\' 7"  (1.702 m), weight 61.4 kg, SpO2 91 %. PHYSICAL EXAMINATION:   GENERAL:  68 y.o.-year-old patient lying in the bed with no acute distress.  EYES: Pupils equal, round, reactive to light and accommodation. No scleral icterus. Extraocular muscles intact.  HEENT: Head atraumatic, normocephalic. Oropharynx and nasopharynx clear.  NECK:  Supple, no jugular venous distention. No thyroid enlargement, no tenderness.  LUNGS: Normal  breath sounds bilaterally, no wheezing, rales,rhonchi or crepitation. No use of accessory muscles of respiration.  CARDIOVASCULAR: S1, S2 normal. No murmurs, rubs, or gallops.  ABDOMEN: Soft, nontender, nondistended. Bowel sounds present. No organomegaly or mass.  EXTREMITIES: No pedal edema, cyanosis, or clubbing. No rash or lesions. + pedal pulses MUSCULOSKELETAL: Normal bulk, and power was 5+ grip and elbow, knee, and ankle flexion and extension bilaterally.  NEUROLOGIC:Alert and oriented x 3. CN 2-12 intact. Sensation to light touch and cold stimuli intact bilaterally. Finger to nose nl. Babinski is downgoing. DTR's (biceps, patellar, and achilles) 2+ and symmetric throughout. Gait not tested due to safety concern. PSYCHIATRIC: The patient is alert and oriented x 3.  SKIN: Multiple bruising as below        DATA REVIEWED:  LABORATORY PANEL:  Male CBC Recent Labs  Lab 03/16/19 0425  WBC 13.7*  HGB 7.8*  HCT 23.1*  PLT 215   ------------------------------------------------------------------------------------------------------------------ Chemistries  Recent Labs  Lab 03/14/19 1506  03/16/19 0425  NA 129*   < > 133*  K 2.9*   < > 4.2  CL 88*   < > 109  CO2 16*   < > 17*  GLUCOSE 121*   < > 123*  BUN 94*   < > 64*  CREATININE 5.83*   < > 3.88*  CALCIUM 9.1   < > 7.9*  MG  --    < > 1.9  AST 23  --   --   ALT 15  --   --   ALKPHOS 139*  --   --   BILITOT 1.0  --   --    < > = values  in this interval not displayed.   RADIOLOGY:  Dg Elbow Complete Left  Result Date: 03/14/2019 CLINICAL DATA:  Left elbow pain after fall. EXAM: LEFT ELBOW - COMPLETE 3+ VIEW COMPARISON:  Radiographs of November 18, 2014. FINDINGS: Status post surgical internal fixation of old distal left humeral fracture. Heterotopic bone formation is noted. Degenerative changes are seen involving the joint spaces between the humerus and radius and ulna. No acute fracture or dislocation is noted. Probable  soft tissue hematoma is noted overlying the proximal ulna. IMPRESSION: Extensive postsurgical and degenerative changes are noted as described above. Probable soft tissue hematoma seen overlying proximal ulna. No acute fracture or dislocation is noted. Electronically Signed   By: Marijo Conception M.D.   On: 03/14/2019 15:17   Ct Head Wo Contrast  Result Date: 03/14/2019 CLINICAL DATA:  Posttraumatic headache and confusion after fall at home. EXAM: CT HEAD WITHOUT CONTRAST CT CERVICAL SPINE WITHOUT CONTRAST TECHNIQUE: Multidetector CT imaging of the head and cervical spine was performed following the standard protocol without intravenous contrast. Multiplanar CT image reconstructions of the cervical spine were also generated. COMPARISON:  CT scan of Feb 20, 2019. FINDINGS: CT HEAD FINDINGS Brain: Mild diffuse cortical atrophy is noted. Mild chronic ischemic white matter disease is noted. 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 No mass effect or midline shift is noted. Ventricular size is within normal limits. There is no evidence of mass lesion, hemorrhage or acute infarction. Vascular: No hyperdense vessel or unexpected calcification. Skull: Normal. Negative for fracture or focal lesion. Sinuses/Orbits: No acute finding. Other: None. CT CERVICAL SPINE FINDINGS Alignment: Grade 1 anterolisthesis of C4-5 is noted secondary to posterior facet joint hypertrophy. Skull base and vertebrae: No acute fracture. No primary bone lesion or focal pathologic process. Soft tissues and spinal canal: No prevertebral fluid or swelling. No visible canal hematoma. Disc levels: Severe degenerative disc disease is noted at C5-6 and C6-7 with anterior posterior osteophyte formation. Upper chest: Negative. Other: Degenerative changes are seen involving posterior facet joints bilaterally. IMPRESSION: Mild diffuse cortical atrophy. Mild chronic ischemic white matter disease. No acute intracranial  abnormality seen. Multilevel degenerative disc disease. No acute abnormality seen in the cervical spine. Electronically Signed   By: Marijo Conception M.D.   On: 03/14/2019 15:32   Ct Cervical Spine Wo Contrast  Result Date: 03/14/2019 CLINICAL DATA:  Posttraumatic headache and confusion after fall at home. EXAM: CT HEAD WITHOUT CONTRAST CT CERVICAL SPINE WITHOUT CONTRAST TECHNIQUE: Multidetector CT imaging of the head and cervical spine was performed following the standard protocol without intravenous contrast. Multiplanar CT image reconstructions of the cervical spine were also generated. COMPARISON:  CT scan of Feb 20, 2019. FINDINGS: CT HEAD FINDINGS Brain: Mild diffuse cortical atrophy is noted. Mild chronic ischemic white matter disease is noted. 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 No mass effect or midline shift is noted. Ventricular size is within normal limits. There is no evidence of mass lesion, hemorrhage or acute infarction. Vascular: No hyperdense vessel or unexpected calcification. Skull: Normal. Negative for fracture or focal lesion. Sinuses/Orbits: No acute finding. Other: None. CT CERVICAL SPINE FINDINGS Alignment: Grade 1 anterolisthesis of C4-5 is noted secondary to posterior facet joint hypertrophy. Skull base and vertebrae: No acute fracture. No primary bone lesion or focal pathologic process. Soft tissues and spinal canal: No prevertebral fluid or swelling. No visible canal hematoma. Disc levels: Severe degenerative disc disease is noted at C5-6 and C6-7 with  anterior posterior osteophyte formation. Upper chest: Negative. Other: Degenerative changes are seen involving posterior facet joints bilaterally. IMPRESSION: Mild diffuse cortical atrophy. Mild chronic ischemic white matter disease. No acute intracranial abnormality seen. Multilevel degenerative disc disease. No acute abnormality seen in the cervical spine. Electronically Signed   By: Marijo Conception M.D.   On: 03/14/2019 15:32   US Renal  Result Date: 03/15/2019 CLINICAL DATA:  Acute renal injury. EXAM: RENAL / URINARY TRACT ULTRASOUND COMPLETE COMPARISON:  None. FINDINGS: Right Kidney: Renal measurements: 11.5 x 6.0 x 5.8 cm = volume: 213 mL . Echogenicity within normal limits. No mass or hydronephrosis visualized. Left Kidney: Renal measurements: 9.1 x 3.6 by 4.3 cm = volume: 75 mL. Increased cortical echogenicity. No hydronephrosis. 4 cm anechoic cyst associated with the LEFT kidney appears benign. Bladder: Appears normal for degree of bladder distention. IMPRESSION: 1. Normal RIGHT kidney.  No hydronephrosis. 2. Atrophic LEFT kidney.  No hydronephrosis. Electronically Signed   By: Suzy Bouchard M.D.   On: 03/15/2019 12:13   Dg Chest Port 1 View  Result Date: 03/15/2019 CLINICAL DATA:  Hypoxia. EXAM: PORTABLE CHEST 1 VIEW COMPARISON:  02/21/2019.  03/24/2015.  11/17/2014.  CT 11/17/2014. FINDINGS: Mediastinum hilar structures normal. Heart size normal. Bibasilar atelectasis. Questionable faint nodular density left upper lung. Nonenhanced chest CT suggested for further evaluation. No pleural effusion or pneumothorax. Old left lower rib fractures again noted. Old nonunited left clavicular fracture noted. Degenerative change thoracic spine. Surgical clips left upper abdomen. IMPRESSION: 1. Questionable left upper lobe small pulmonary nodule cannot be excluded. Nonenhanced chest CT suggested for further evaluation. 2.  Bibasilar atelectasis. Electronically Signed   By: Marcello Moores  Register   On: 03/15/2019 10:29   Dg Shoulder Left  Result Date: 03/14/2019 CLINICAL DATA:  Fall.  Pain. EXAM: LEFT SHOULDER - 2+ VIEW COMPARISON:  CT 09/05/2018. FINDINGS: Acromioclavicular glenohumeral degenerative change. Old non unified fracture of the distal clavicle noted. Angulated old non healed fracture proximal left humerus. Proximal humerus consistent with non healed subcapital fracture. Angulated subcapital  fracture of the left proximal humerus is noted on prior CT of 09/05/2018. Plate screw fixation distal humerus. No new abnormality identified. Negative. IMPRESSION: 1. Old non unified fracture left distal clavicle. Angulated old non healed fracture left proximal humerus. 2.  No acute abnormality. Electronically Signed   By: Marcello Moores  Register   On: 03/14/2019 15:17   ASSESSMENT AND PLAN:   68 y.o. male past medical history of EtOH abuse, diabetes, hypertension, hyperlipidemia, gout, nephrolithiasis, CKD stage III, COPD, and memory loss presenting with frequent falls.  1. 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 Severe anemia -likely multifactorial in the setting of AKI, bone marrow suppression and malnutrition from heavy alcohol use - post transfusion - Hemoglobin 7.8 - Continue to monitor H&H - GI input appreciated  3. Acute on chronic kidney disease -patient with history of atrophic left kidney on renal ultrasound - Renal function improved with IVFs - Avoid nephrotoxins - Continue to monitor renal function - Nephrology input appreciated  4. Hypokalemia-improved with supplement, mag normal  5. EtOH Abuse/ Withdrawal  - Daily Thiamine, Folate, MVI and B12 supplements - Insomnia: Trazodone - SW consult for cessation resources - PT/OT evaluation for mobility - CIWA +/- Standing Protocol  6. DM: His sugars have been well-controlled on current regimen  - Holding  metformin in house - Low intensity sliding scale insulin coverage - ADA 2100 calorie diet  - FS BS qac and qhs   7.  Tobacco abuse - Smoking cessation counseling - Nicotine patch  8. DVT prophylaxis - Hold anti-coagulation for anemia  9.  Hyponatremia - likely due to  beer potomania.  - Improving  10. Leukocytosis : WBC 13.7 improved from 14.9 - Monitor for  fever  - UA pending collection - CXR negative for pneumonia - Trend WBC's and Procalcitonin  All the records are reviewed and case discussed with Care Management/Social Worker. Management plans discussed with the patient, family and they are in agreement.  CODE STATUS: Full Code  TOTAL TIME TAKING CARE OF THIS PATIENT: 38 minutes.   More than 50% of the time was spent in counseling/coordination of care: YES  POSSIBLE D/C IN 1 DAYS, DEPENDING ON CLINICAL CONDITION.   on 03/16/2019 at 12:37 PM  This patient was staffed with Dr. Valetta Fuller, [facility] who personally evaluated patient, reviewed documentation and agreed with assessment and plan of care as above.  Rufina Falco, DNP, FNP-BC [facility] Nurse Practitioner   Between 7am to 6pm - Pager [phone]  After 6pm go to [email or web address] - Technical brewer [facility]  Office  [phone]  CC: Primary care physician; Windy Fast, MD  Note: This dictation was prepared with Dragon dictation along with smaller phrase technology. Any transcriptional errors that result from this process are unintentional.

## 2019-03-16 NOTE — Progress Notes (Addendum)
PT Cancellation Note  Patient Details Name: Edward Singleton MRN: [id number] DOB: 1951/06/01   Cancelled Treatment:    Reason Eval/Treat Not Completed: Other (comment). Consult received and chart reviewed. Per RN, just gave pain meds. Pt reports he is in too much to participate in therapy. Will re-attempt, time permitting.  Addendum: 2nd attempt, pt still refusing due to increased pain. Wishes for PT to attempt next date.   Lemario Chaikin 03/16/2019, 11:12 AM Greggory Stallion, PT, DPT [phone]

## 2019-03-16 NOTE — Consult Note (Signed)
PHARMACY CONSULT NOTE - FOLLOW UP  Pharmacy Consult for Electrolyte Monitoring and Replacement   Recent Labs: Potassium (mmol/L)  Date Value  03/16/2019 4.2  01/16/2014 5.0   Magnesium (mg/dL)  Date Value  03/16/2019 1.9  01/15/2014 1.2 (L)   Calcium (mg/dL)  Date Value  03/16/2019 7.9 (L)   Calcium, Total (mg/dL)  Date Value  01/16/2014 8.5   Albumin (g/dL)  Date Value  03/14/2019 4.2  10/16/2015 4.7   Phosphorus (mg/dL)  Date Value  03/16/2019 3.1   Sodium (mmol/L)  Date Value  03/16/2019 133 (L)  08/11/2015 136 (A)  01/16/2014 139     Assessment: Pt presented with known history of alcohol abuse, and had a fall. K+ 3 only received KCl IV 10 mEq x 1 per MAR, no PO KCL given. Mg 1.7. Ca is 7.9 with a albumin of 4.2. Pt is on 0.9 NS w/ KCl 20 mEq infusion.   Goal of Therapy:  Electrolytes WNL.   Plan:  K 4.2  Mag 1.9  Phos 3.1  Scr 3.88  Ca 7.9  ALb 4.2  Pt on IVF NS w/ KCL 20 meq at 100 ml/hr   Recheck levels with AM labs.   Noralee Space ,PharmD, BCPS Clinical Pharmacist 03/16/2019 1:34 PM

## 2019-03-16 NOTE — Plan of Care (Signed)

## 2019-03-17 DIAGNOSIS — D649 Anemia, unspecified: Secondary | ICD-10-CM

## 2019-03-17 LAB — URINALYSIS, ROUTINE W REFLEX MICROSCOPIC
Bacteria, UA: NONE SEEN
Bilirubin Urine: NEGATIVE
Glucose, UA: NEGATIVE mg/dL
Ketones, ur: NEGATIVE mg/dL
Leukocytes,Ua: NEGATIVE
Nitrite: NEGATIVE
Protein, ur: NEGATIVE mg/dL
Specific Gravity, Urine: 1.008 (ref 1.005–1.030)
Squamous Epithelial / HPF: NONE SEEN (ref 0–5)
pH: 5 (ref 5.0–8.0)

## 2019-03-17 LAB — CBC WITH DIFFERENTIAL/PLATELET
Abs Immature Granulocytes: 0.14 10*3/uL — ABNORMAL HIGH (ref 0.00–0.07)
Basophils Absolute: 0 10*3/uL (ref 0.0–0.1)
Basophils Relative: 0 %
Eosinophils Absolute: 0.2 10*3/uL (ref 0.0–0.5)
Eosinophils Relative: 2 %
HCT: 22.6 % — ABNORMAL LOW (ref 39.0–52.0)
Hemoglobin: 7.4 g/dL — ABNORMAL LOW (ref 13.0–17.0)
Immature Granulocytes: 1 %
Lymphocytes Relative: 13 %
Lymphs Abs: 1.8 10*3/uL (ref 0.7–4.0)
MCH: 34.4 pg — ABNORMAL HIGH (ref 26.0–34.0)
MCHC: 32.7 g/dL (ref 30.0–36.0)
MCV: 105.1 fL — ABNORMAL HIGH (ref 80.0–100.0)
Monocytes Absolute: 1.8 10*3/uL — ABNORMAL HIGH (ref 0.1–1.0)
Monocytes Relative: 13 %
Neutro Abs: 9.4 10*3/uL — ABNORMAL HIGH (ref 1.7–7.7)
Neutrophils Relative %: 71 %
Platelets: 244 10*3/uL (ref 150–400)
RBC: 2.15 MIL/uL — ABNORMAL LOW (ref 4.22–5.81)
RDW: 17.2 % — ABNORMAL HIGH (ref 11.5–15.5)
WBC: 13.4 10*3/uL — ABNORMAL HIGH (ref 4.0–10.5)
nRBC: 1 % — ABNORMAL HIGH (ref 0.0–0.2)

## 2019-03-17 LAB — BASIC METABOLIC PANEL
Anion gap: 7 (ref 5–15)
BUN: 47 mg/dL — ABNORMAL HIGH (ref 8–23)
CO2: 19 mmol/L — ABNORMAL LOW (ref 22–32)
Calcium: 8 mg/dL — ABNORMAL LOW (ref 8.9–10.3)
Chloride: 113 mmol/L — ABNORMAL HIGH (ref 98–111)
Creatinine, Ser: 3.47 mg/dL — ABNORMAL HIGH (ref 0.61–1.24)
GFR calc Af Amer: 20 mL/min — ABNORMAL LOW (ref >60)
GFR calc non Af Amer: 17 mL/min — ABNORMAL LOW (ref >60)
Glucose, Bld: 136 mg/dL — ABNORMAL HIGH (ref 70–99)
Potassium: 4.2 mmol/L (ref 3.5–5.1)
Sodium: 139 mmol/L (ref 135–145)

## 2019-03-17 MED ORDER — SODIUM CHLORIDE 0.9 % IV SOLN
INTRAVENOUS | Status: DC
  Administered 2019-03-18: 09:00:00 via INTRAVENOUS

## 2019-03-17 NOTE — Evaluation (Signed)
Physical Therapy Evaluation Patient Details Name: ADRIN Singleton MRN: [id number] DOB: [birth year] Today's Date: 03/17/2019   History of Present Illness  68 y.o. male past medical history of EtOH abuse, diabetes, hypertension, hyperlipidemia, gout, nephrolithiasis, CKD stage III, COPD, and memory loss presenting with frequent falls.  Clinical Impression  Pt did reasonably well with PT exam but was unable to effectively use walker secondary to L UE pain from most recent fall and displayed unsteady/ataxic gait w/o UE use.  PT giving CGA the entire time with no overt LOBs but unsteadiness t/o the effort.  He was weak and limited and seems to now understand that he frankly is unsafe at home. He has had multiple falls (many associated with ETOH?) and is unsafe to return home at this time.  Pt will need STR and likely will transition to ALF when safe to do so.      Follow Up Recommendations SNF    Equipment Recommendations  Cane    Recommendations for Other Services       Precautions / Restrictions Precautions Precautions: Fall Restrictions Weight Bearing Restrictions: No Other Position/Activity Restrictions: L arm bandaged, painful, he does/can not use it much      Mobility  Bed Mobility Overal bed mobility: Modified Independent             General bed mobility comments: Pt slow but able to rise to EOB w/o direct assist  Transfers Overall transfer level: Needs assistance Equipment used: Rolling walker (2 wheeled) Transfers: Sit to/from Stand Sit to Stand: Min assist         General transfer comment: Pt struggled with getting up from and controlling down to sitting position, needed assist for control  Ambulation/Gait Ambulation/Gait assistance: Min assist Gait Distance (Feet): 35 Feet Assistive device: Rolling walker (2 wheeled);None       General Gait Details: Attempted to use walker but unable to use L UE effectively and walker was more of a liability.  Deferred AD  and pt able to manage with shuffling, inconsistent gait but no overt LOBs or safety issues.    Stairs            Wheelchair Mobility    Modified Rankin (Stroke Patients Only)       Balance Overall balance assessment: Needs assistance Sitting-balance support: Bilateral upper extremity supported Sitting balance-Leahy Scale: Good     Standing balance support: No upper extremity supported Standing balance-Leahy Scale: Fair Standing balance comment: Statically pt did relatively well, showed unsteadiness with all dynamic standing tasks                             Pertinent Vitals/Pain Pain Assessment: 0-10 Pain Score: 6  Pain Location: L UE    Home Living Family/patient expects to be discharged to:: Skilled nursing facility Living Arrangements: Alone               Additional Comments: Sister helps with errands, taking to MD, etc but is not around even 1x/wk    Prior Function Level of Independence: Needs assistance         Comments: Pt has been able to manage in the home but has frequent falls (many related to ETOH?) and is not safe in the home     Hand Dominance        Extremity/Trunk Assessment   Upper Extremity Assessment Upper Extremity Assessment: Generalized weakness(L UE limited due to injury from recent fall, min function)  Lower Extremity Assessment Lower Extremity Assessment: Generalized weakness       Communication   Communication: No difficulties  Cognition Arousal/Alertness: Awake/alert Behavior During Therapy: WFL for tasks assessed/performed Overall Cognitive Status: Within Functional Limits for tasks assessed                                        General Comments      Exercises     Assessment/Plan    PT Assessment Patient needs continued PT services  PT Problem List Decreased strength;Decreased range of motion;Decreased activity tolerance;Decreased balance;Decreased mobility;Decreased  coordination;Decreased knowledge of use of DME;Decreased safety awareness;Decreased knowledge of precautions;Decreased cognition       PT Treatment Interventions DME instruction;Gait training;Functional mobility training;Therapeutic activities;Therapeutic exercise;Balance training;Neuromuscular re-education;Cognitive remediation;Patient/family education    PT Goals (Current goals can be found in the Care Plan section)  Acute Rehab PT Goals Patient Stated Goal: go to rehab and get stronger then onto an ALF PT Goal Formulation: With patient Time For Goal Achievement: 03/31/19 Potential to Achieve Goals: Good    Frequency Min 2X/week   Barriers to discharge        Co-evaluation               AM-PAC PT "6 Clicks" Mobility  Outcome Measure Help needed turning from your back to your side while in a flat bed without using bedrails?: A Little Help needed moving from lying on your back to sitting on the side of a flat bed without using bedrails?: A Little Help needed moving to and from a bed to a chair (including a wheelchair)?: A Little Help needed standing up from a chair using your arms (e.g., wheelchair or bedside chair)?: A Lot Help needed to walk in hospital room?: A Little Help needed climbing 3-5 steps with a railing? : A Lot 6 Click Score: 16    End of Session Equipment Utilized During Treatment: Gait belt Activity Tolerance: Patient limited by fatigue;Patient tolerated treatment well Patient left: with bed alarm set;with call bell/phone within reach Nurse Communication: Mobility status PT Visit Diagnosis: Muscle weakness (generalized) (M62.81);Difficulty in walking, not elsewhere classified (R26.2);Ataxic gait (R26.0);Repeated falls (R29.6)    Time: 3754-3606 PT Time Calculation (min) (ACUTE ONLY): 20 min   Charges:   PT Evaluation $PT Eval Low Complexity: 1 Low PT Treatments $Gait Training: 8-22 mins        Kreg Shropshire, DPT 03/17/2019, 11:56 AM

## 2019-03-17 NOTE — Progress Notes (Addendum)
[facility] [facility] [place] NAME: Edward Singleton    MR#:  [id number]  DATE OF BIRTH:  May 30, 1951  SUBJECTIVE:   Chief Complaint  Patient presents with  . Fall  . Alcohol Problem   Patient seen at the bedside. He is up with PT. Still complaining of pain in his left shoulder.  REVIEW OF SYSTEMS:  Review of Systems  Constitutional: Positive for weight loss. Negative for chills, fever and malaise/fatigue.  HENT: Negative for congestion, hearing loss and sore throat.   Eyes: Negative for blurred vision and double vision.  Respiratory: Negative for cough, shortness of breath and wheezing.   Cardiovascular: Negative for chest pain, palpitations, orthopnea and leg swelling.  Gastrointestinal: Negative for abdominal pain, diarrhea, nausea and vomiting.  Genitourinary: Negative for dysuria and urgency.  Musculoskeletal: Positive for falls and joint pain. Negative for myalgias.  Skin: Negative for rash.       Multiple bruising in upper and lower extremity  Neurological: Negative for dizziness, sensory change, speech change, focal weakness and headaches.  Endo/Heme/Allergies: Bruises/bleeds easily.  Psychiatric/Behavioral: Positive for memory loss and substance abuse. Negative for depression. The patient has insomnia.    DRUG ALLERGIES:  No Known Allergies VITALS:  Blood pressure 121/70, pulse 97, temperature 97.8 F (36.6 C), resp. rate 17, height 5\' 7"  (1.702 m), weight 69 kg, SpO2 93 %. PHYSICAL EXAMINATION:   GENERAL:  68 y.o.-year-old patient lying in the bed with no acute distress.  EYES: Pupils equal, round, reactive to light and accommodation. No scleral icterus. Extraocular muscles intact.  HEENT: Head atraumatic, normocephalic. Oropharynx and nasopharynx clear.  NECK:  Supple, no jugular venous distention. No thyroid enlargement, no tenderness.  LUNGS: Normal breath sounds bilaterally, no wheezing, rales,rhonchi or crepitation. No use of  accessory muscles of respiration.  CARDIOVASCULAR: S1, S2 normal. No murmurs, rubs, or gallops.  ABDOMEN: Soft, nontender, nondistended. Bowel sounds present. No organomegaly or mass.  EXTREMITIES: No pedal edema, cyanosis, or clubbing. No rash or lesions. + pedal pulses MUSCULOSKELETAL: Normal bulk, and power was 5+ grip and elbow, knee, and ankle flexion and extension bilaterally.  NEUROLOGIC:Alert and oriented x 3. CN 2-12 intact. Sensation to light touch and cold stimuli intact bilaterally. Finger to nose nl. Babinski is downgoing. DTR's (biceps, patellar, and achilles) 2+ and symmetric throughout. Gait not tested due to safety concern. PSYCHIATRIC: The patient is alert and oriented x 3.  SKIN: Multiple bruising as below        DATA REVIEWED:  LABORATORY PANEL:  Male CBC Recent Labs  Lab 03/17/19 0458  WBC 13.4*  HGB 7.4*  HCT 22.6*  PLT 244   ------------------------------------------------------------------------------------------------------------------ Chemistries  Recent Labs  Lab 03/14/19 1506  03/16/19 0425 03/17/19 0458  NA 129*   < > 133* 139  K 2.9*   < > 4.2 4.2  CL 88*   < > 109 113*  CO2 16*   < > 17* 19*  GLUCOSE 121*   < > 123* 136*  BUN 94*   < > 64* 47*  CREATININE 5.83*   < > 3.88* 3.47*  CALCIUM 9.1   < > 7.9* 8.0*  MG  --    < > 1.9  --   AST 23  --   --   --   ALT 15  --   --   --   ALKPHOS 139*  --   --   --   BILITOT 1.0  --   --   --    < > =  values in this interval not displayed.   RADIOLOGY:  US Renal  Result Date: 03/15/2019 CLINICAL DATA:  Acute renal injury. EXAM: RENAL / URINARY TRACT ULTRASOUND COMPLETE COMPARISON:  None. FINDINGS: Right Kidney: Renal measurements: 11.5 x 6.0 x 5.8 cm = volume: 213 mL . Echogenicity within normal limits. No mass or hydronephrosis visualized. Left Kidney: Renal measurements: 9.1 x 3.6 by 4.3 cm = volume: 75 mL. Increased cortical echogenicity. No hydronephrosis. 4 cm anechoic cyst associated with  the LEFT kidney appears benign. Bladder: Appears normal for degree of bladder distention. IMPRESSION: 1. Normal RIGHT kidney.  No hydronephrosis. 2. Atrophic LEFT kidney.  No hydronephrosis. Electronically Signed   By: Suzy Bouchard M.D.   On: 03/15/2019 12:13   ASSESSMENT AND PLAN:   68 y.o. male past medical history of EtOH abuse, diabetes, hypertension, hyperlipidemia, gout, nephrolithiasis, CKD stage III, COPD, and memory loss presenting with frequent falls.  1. 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 Severe anemia -likely multifactorial in the setting of AKI, bone marrow suppression and malnutrition from heavy alcohol use - post transfusion - Hemoglobin 7.4 today, will transfuse if H<7 - Continue to monitor H&H - GI planning for EGD/colonoscopy when AKI has improved. - GI input appreciated  3. Acute on chronic kidney disease -patient with history of atrophic left kidney on renal ultrasound - Renal function continues to improved with IVFs - Avoid nephrotoxins - Continue to monitor renal function - Nephrology input appreciated  4. Hypokalemia-improved with supplement, mag normal  5. EtOH Abuse/ Withdrawal  - Daily Thiamine, Folate, MVI and B12 supplements - Insomnia: Trazodone - SW consult for cessation resources - PT/OT evaluation for mobility - CIWA +/- Standing Protocol  6. DM: His sugars have been well-controlled on current regimen  - Holding  metformin in house - Low intensity sliding scale insulin coverage - ADA 2100 calorie diet  - FS BS qac and qhs   7.  Tobacco abuse - Smoking cessation counseling - Nicotine patch  8. DVT prophylaxis - Hold anti-coagulation for anemia  9.  Hyponatremia - likely due to  beer potomania.  - Improving  10. 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  All the records are reviewed and case discussed with Care Management/Social Worker. Management plans discussed with the patient, family and they are in agreement.  CODE STATUS: Full Code  TOTAL TIME TAKING CARE OF THIS PATIENT: 38 minutes.   More than 50% of the time was spent in counseling/coordination of care: YES  POSSIBLE D/C IN 1 DAYS, DEPENDING ON CLINICAL CONDITION.   on 03/17/2019 at 11:34 AM  This patient was staffed with Dr. Valetta Fuller, [facility] who personally evaluated patient, reviewed documentation and agreed with assessment and plan of care as above.  Rufina Falco, DNP, FNP-BC [facility] Nurse Practitioner   Between 7am to 6pm - Pager [phone]  After 6pm go to [email or web address] - Technical [employer] [facility]  Office  [phone]  CC: Primary care physician; Windy Fast, MD  Note: This dictation was prepared  with Dragon dictation along with smaller phrase technology. Any transcriptional errors that result from this process are unintentional.

## 2019-03-17 NOTE — Plan of Care (Signed)
  Problem: Education: Goal: Knowledge of General Education information will improve Description: Including pain rating scale, medication(s)/side effects and non-pharmacologic comfort measures Outcome: Progressing   Problem: Pain Managment: Goal: General experience of comfort will improve Outcome: Progressing   Problem: Safety: Goal: Ability to remain free from injury will improve Outcome: Progressing   

## 2019-03-17 NOTE — Consult Note (Signed)
PHARMACY CONSULT NOTE - FOLLOW UP  Pharmacy Consult for Electrolyte Monitoring and Replacement   Recent Labs: Potassium (mmol/L)  Date Value  03/17/2019 4.2  01/16/2014 5.0   Magnesium (mg/dL)  Date Value  03/16/2019 1.9  01/15/2014 1.2 (L)   Calcium (mg/dL)  Date Value  03/17/2019 8.0 (L)   Calcium, Total (mg/dL)  Date Value  01/16/2014 8.5   Albumin (g/dL)  Date Value  03/14/2019 4.2  10/16/2015 4.7   Phosphorus (mg/dL)  Date Value  03/16/2019 3.1   Sodium (mmol/L)  Date Value  03/17/2019 139  08/11/2015 136 (A)  01/16/2014 139     Assessment: Pt presented with known history of alcohol abuse, and had a fall. K+ 3 only received KCl IV 10 mEq x 1 per MAR, no PO KCL given. Mg 1.7. Ca is 7.9 with a albumin of 4.2. Pt is on 0.9 NS w/ KCl 20 mEq infusion.   Goal of Therapy:  Electrolytes WNL.   Plan:  K 4.2   Scr 3.88>>3.47   Pt on IVF NS w/ KCL 20 meq at 100 ml/hr   Recheck levels with AM labs.   Noralee Space ,PharmD, BCPS Clinical Pharmacist 03/17/2019 10:01 AM

## 2019-03-17 NOTE — Progress Notes (Signed)
Vonda Antigua, MD [address] [place], [place], [place], [address] [address], [place], [place], [place], [address] Phone: [phone]  Fax: [phone]   Subjective: Patient resting in bed comfortably.  Alert and oriented.  Denies any abdominal pain, nausea vomiting, or any episodes of bleeding.   Objective: Exam: Vital signs in last 24 hours: Vitals:   03/17/19 0358 03/17/19 0359 03/17/19 0600 03/17/19 0820  BP: 98/67 107/61 105/70 121/70  Pulse: 84 82 75 97  Resp:    17  Temp:    97.8 F (36.6 C)  TempSrc:      SpO2:    93%  Weight:      Height:       Weight change: 7.62 kg  Intake/Output Summary (Last 24 hours) at 03/17/2019 1312 Last data filed at 03/17/2019 0941 Gross per 24 hour  Intake 2008.65 ml  Output 1105 ml  Net 903.65 ml    General: No acute distress, AAO x3 Abd: Soft, NT/ND, No HSM Skin: Warm, no rashes Neck: Supple, Trachea midline   Lab Results: Lab Results  Component Value Date   WBC 13.4 (H) 03/17/2019   HGB 7.4 (L) 03/17/2019   HCT 22.6 (L) 03/17/2019   MCV 105.1 (H) 03/17/2019   PLT 244 03/17/2019   Micro Results: Recent Results (from the past 240 hour(s))  SARS Coronavirus 2 (CEPHEID - Performed in [place] hospital lab), Hosp Order     Status: None   Collection Time: 03/14/19  6:11 PM  Result Value Ref Range Status   SARS Coronavirus 2 NEGATIVE NEGATIVE Final    Comment: (NOTE) If result is NEGATIVE SARS-CoV-2 target nucleic acids are NOT DETECTED. The SARS-CoV-2 RNA is generally detectable in upper and lower  respiratory specimens during the acute phase of infection. The lowest  concentration of SARS-CoV-2 viral copies this assay can detect is 250  copies / mL. A negative result does not preclude SARS-CoV-2 infection  and should not be used as the sole basis for treatment or other  patient management decisions.  A negative result may occur with  improper specimen collection / handling, submission of specimen  other  than nasopharyngeal swab, presence of viral mutation(s) within the  areas targeted by this assay, and inadequate number of viral copies  (<250 copies / mL). A negative result must be combined with clinical  observations, patient history, and epidemiological information. If result is POSITIVE SARS-CoV-2 target nucleic acids are DETECTED. The SARS-CoV-2 RNA is generally detectable in upper and lower  respiratory specimens dur ing the acute phase of infection.  Positive  results are indicative of active infection with SARS-CoV-2.  Clinical  correlation with patient history and other diagnostic information is  necessary to determine patient infection status.  Positive results do  not rule out bacterial infection or co-infection with other viruses. If result is PRESUMPTIVE POSTIVE SARS-CoV-2 nucleic acids MAY BE PRESENT.   A presumptive positive result was obtained on the submitted specimen  and confirmed on repeat testing.  While 2019 novel coronavirus  (SARS-CoV-2) nucleic acids may be present in the submitted sample  additional confirmatory testing may be necessary for epidemiological  and / or clinical management purposes  to differentiate between  SARS-CoV-2 and other Sarbecovirus currently known to infect humans.  If clinically indicated additional testing with an alternate test  methodology [phone]) is advised. The SARS-CoV-2 RNA is generally  detectable in upper and lower respiratory sp ecimens during the acute  phase of infection. The expected  result is Negative. Fact Sheet for Patients:  StrictlyIdeas.no Fact Sheet for Healthcare Providers: BankingDealers.co.za This test is not yet approved or cleared by the [place] FDA and has been authorized for detection and/or diagnosis of SARS-CoV-2 by FDA under an Emergency Use Authorization (EUA).  This EUA will remain in effect (meaning this test can be used) for the duration  of the COVID-19 declaration under Section 564(b)(1) of the Act, 21 U.S.C. section 360bbb-3(b)(1), unless the authorization is terminated or revoked sooner. Performed at [facility], [address], [place], [place] [address]    Studies/Results: No results found. Medications:  Scheduled Meds: . allopurinol  100 mg Oral Daily  . aspirin  81 mg Oral Daily  . escitalopram  10 mg Oral Daily  . feeding supplement (ENSURE ENLIVE)  237 mL Oral TID BM  . folic acid  1 mg Oral Daily  . LORazepam  0-4 mg Intravenous Q12H  . midodrine  10 mg Oral TID WC  . multivitamin with minerals  1 tablet Oral Daily  . nicotine  21 mg Transdermal Daily  . pantoprazole (PROTONIX) IV  40 mg Intravenous Q12H  . rosuvastatin  20 mg Oral Daily  . [START ON 03/23/2019] thiamine  100 mg Oral Daily  . traZODone  50 mg Oral Daily  . vitamin C  500 mg Oral BID   Continuous Infusions: . sodium chloride Stopped (03/15/19 1857)  . 0.9 % NaCl with KCl 20 mEq / L 100 mL/hr at 03/17/19 1300  . [START ON 03/18/2019] thiamine injection     PRN Meds:.sodium chloride, acetaminophen **OR** acetaminophen, HYDROmorphone (DILAUDID) injection, LORazepam **OR** LORazepam, ondansetron **OR** ondansetron (ZOFRAN) IV, oxyCODONE-acetaminophen, senna-docusate   Assessment: Anemia   Plan: As per Dr. Verlin Grills initial consult note, plan was to schedule his GI procedures once he recovers from AKI  AKI still present, however, creatinine has improved since admission and stabilized over the last 2 days.  Hyponatremia has also improved  Therefore, this would be the best time to proceed with an upper endoscopy to evaluate his anemia  I discussed this with the patient and if he would be interested to undergo this procedure tomorrow.  He stated "I do not know, if I feel like it on that day".  I discussed the importance of the procedure given his underlying anemia with unknown etiology.  He verbalized understanding, and is  agreeable to being placed on the schedule and potentially doing it tomorrow.  We will plan for upper endoscopy tomorrow  Patient states he had a colonoscopy about 3 months ago at [facility].  There are no such records anywhere in his chart or on provation in this regard.    I do see colonoscopy report from 2011 in provation done by Dr. Vira Agar and a 3 mm cecal polyp removed.  Pathology report not available.  Patient is not agreeable to colonoscopy at this time as he is under the impression ?he had one 3 months ago  We will therefore attempt an upper endoscopy first and if negative, discuss colonoscopy with him again  No evidence of active GI bleeding  N.p.o. past midnight  Patient will be followed by Dr. Vicente Males and upper endoscopy to be done by Dr. Vicente Males tomorrow  CIWA protocol Folate, thiamine alcohol abstinence encouraged  Normal INR and normal platelets go against cirrhosis at this time   LOS: 3 days   Vonda Antigua, MD 03/17/2019, 1:12 PM

## 2019-03-17 NOTE — Progress Notes (Signed)
[facility], [place] 03/17/19  Subjective:   Patient is doing fair.  Complaint of left shoulder pain Serum creatinine improved to 3.5 Urine output 1800 cc  Objective:  Vital signs in last 24 hours:  Temp:  [97.8 F (36.6 C)-98.1 F (36.7 C)] 97.8 F (36.6 C) (06/07 0820) Pulse Rate:  [75-101] 97 (06/07 0820) Resp:  [17-20] 17 (06/07 0820) BP: (82-121)/(52-80) 121/70 (06/07 0820) SpO2:  [90 %-97 %] 93 % (06/07 0820) Weight:  [69 kg] 69 kg (06/07 0250)  Weight change: 7.62 kg Filed Weights   03/14/19 1951 03/16/19 0421 03/17/19 0250  Weight: 61.4 kg 61.4 kg 69 kg    Intake/Output:    Intake/Output Summary (Last 24 hours) at 03/17/2019 1304 Last data filed at 03/17/2019 0941 Gross per 24 hour  Intake 2008.65 ml  Output 1105 ml  Net 903.65 ml     Physical Exam: General:  No acute distress, laying in the bed  HEENT  anicteric, moist oral mucous membranes  Neck  supple  Pulm/lungs  breathing comfortably on room air, no crackles  CVS/Heart  no rub or gallop  Abdomen:   Soft, nontender  Extremities:  Trace edema, left arm/shoulder ice pack in place  Neurologic:  Alert, able to follow simple commands  Skin:  Normal turgor    Basic Metabolic Panel:  Recent Labs  Lab 03/14/19 1506 03/14/19 2017 03/15/19 0530 03/16/19 0425 03/17/19 0458  NA 129*  --  130* 133* 139  K 2.9*  --  3.0* 4.2 4.2  CL 88*  --  97* 109 113*  CO2 16*  --  17* 17* 19*  GLUCOSE 121*  --  148* 123* 136*  BUN 94*  --  90* 64* 47*  CREATININE 5.83*  --  4.89* 3.88* 3.47*  CALCIUM 9.1  --  7.9* 7.9* 8.0*  MG  --  1.6* 1.7 1.9  --   PHOS  --   --   --  3.1  --      CBC: Recent Labs  Lab 03/14/19 1506 03/14/19 2210 03/15/19 0530 03/15/19 1913 03/16/19 0425 03/17/19 0458  WBC 18.7*  --  14.9*  --  13.7* 13.4*  NEUTROABS 15.4*  --   --   --  10.3* 9.4*  HGB 9.5* 7.2* 7.1* 8.0* 7.8* 7.4*  HCT 26.7* 20.7* 20.6* 24.3* 23.1* 22.6*  MCV 96.4  --  98.6  --   101.8* 105.1*  PLT 303  --  232  --  215 244     No results found for: HEPBSAG, HEPBSAB, HEPBIGM    Microbiology:  Recent Results (from the past 240 hour(s))  SARS Coronavirus 2 (CEPHEID - Performed in [place] hospital lab), Hosp Order     Status: None   Collection Time: 03/14/19  6:11 PM  Result Value Ref Range Status   SARS Coronavirus 2 NEGATIVE NEGATIVE Final    Comment: (NOTE) If result is NEGATIVE SARS-CoV-2 target nucleic acids are NOT DETECTED. The SARS-CoV-2 RNA is generally detectable in upper and lower  respiratory specimens during the acute phase of infection. The lowest  concentration of SARS-CoV-2 viral copies this assay can detect is 250  copies / mL. A negative result does not preclude SARS-CoV-2 infection  and should not be used as the sole basis for treatment or other  patient management decisions.  A negative result may occur with  improper specimen collection / handling, submission of specimen other  than nasopharyngeal swab, presence of viral  mutation(s) within the  areas targeted by this assay, and inadequate number of viral copies  (<250 copies / mL). A negative result must be combined with clinical  observations, patient history, and epidemiological information. If result is POSITIVE SARS-CoV-2 target nucleic acids are DETECTED. The SARS-CoV-2 RNA is generally detectable in upper and lower  respiratory specimens dur ing the acute phase of infection.  Positive  results are indicative of active infection with SARS-CoV-2.  Clinical  correlation with patient history and other diagnostic information is  necessary to determine patient infection status.  Positive results do  not rule out bacterial infection or co-infection with other viruses. If result is PRESUMPTIVE POSTIVE SARS-CoV-2 nucleic acids MAY BE PRESENT.   A presumptive positive result was obtained on the submitted specimen  and confirmed on repeat testing.  While 2019 novel coronavirus   (SARS-CoV-2) nucleic acids may be present in the submitted sample  additional confirmatory testing may be necessary for epidemiological  and / or clinical management purposes  to differentiate between  SARS-CoV-2 and other Sarbecovirus currently known to infect humans.  If clinically indicated additional testing with an alternate test  methodology [phone]) is advised. The SARS-CoV-2 RNA is generally  detectable in upper and lower respiratory sp ecimens during the acute  phase of infection. The expected result is Negative. Fact Sheet for Patients:  StrictlyIdeas.no Fact Sheet for Healthcare Providers: BankingDealers.co.za This test is not yet approved or cleared by the [place] FDA and has been authorized for detection and/or diagnosis of SARS-CoV-2 by FDA under an Emergency Use Authorization (EUA).  This EUA will remain in effect (meaning this test can be used) for the duration of the COVID-19 declaration under Section 564(b)(1) of the Act, 21 U.S.C. section 360bbb-3(b)(1), unless the authorization is terminated or revoked sooner. Performed at [facility], [address] [place], [place] [address]     Coagulation Studies: No results for input(s): LABPROT, INR in the last 72 hours.  Urinalysis: Recent Labs    03/17/19 1213  COLORURINE STRAW*  LABSPEC 1.008  PHURINE 5.0  GLUCOSEU NEGATIVE  HGBUR SMALL*  BILIRUBINUR NEGATIVE  KETONESUR NEGATIVE  PROTEINUR NEGATIVE  NITRITE NEGATIVE  LEUKOCYTESUR NEGATIVE      Imaging: No results found.   Medications:   . sodium chloride Stopped (03/15/19 1857)  . 0.9 % NaCl with KCl 20 mEq / L 100 mL/hr at 03/17/19 1300  . [START ON 03/18/2019] thiamine injection     . allopurinol  100 mg Oral Daily  . aspirin  81 mg Oral Daily  . escitalopram  10 mg Oral Daily  . feeding supplement (ENSURE ENLIVE)  237 mL Oral TID BM  . folic acid  1 mg Oral Daily  . LORazepam   0-4 mg Intravenous Q12H  . midodrine  10 mg Oral TID WC  . multivitamin with minerals  1 tablet Oral Daily  . nicotine  21 mg Transdermal Daily  . pantoprazole (PROTONIX) IV  40 mg Intravenous Q12H  . rosuvastatin  20 mg Oral Daily  . [START ON 03/23/2019] thiamine  100 mg Oral Daily  . traZODone  50 mg Oral Daily  . vitamin C  500 mg Oral BID   sodium chloride, acetaminophen **OR** acetaminophen, HYDROmorphone (DILAUDID) injection, LORazepam **OR** LORazepam, ondansetron **OR** ondansetron (ZOFRAN) IV, oxyCODONE-acetaminophen, senna-docusate  Assessment/ Plan:  68 y.o. male with alcohol abuse, atrophic left kidney, history of BPH, diabetes, gout, hypertension, history of kidney stones, coronary artery disease and atherosclerosis noted on CT  abdomen,, was admitted on 03/14/2019 after he was found down from a fall.   1.  Acute kidney injury Patient is nonoliguric.  Serum creatinine at admission was 5.8 today, has improved to 4.9 and further down to 3.5 with IV hydration.  Renal ultrasound shows atrophic left kidney and normal right kidney.  No kidney stones or obstruction.  Continue IV hydration.  We will continue to monitor  2.  Chronic kidney disease stage III.  With atrophic left kidney and history of multiple kidney stones and history of lithotripsy Baseline creatinine 1.5/GFR 47 Hematuria and proteinuria noted on previous urinalysis We will repeat  3.  Diabetes type 2 with chronic kidney disease Lab Results  Component Value Date   HGBA1C 5.6 01/15/2014  Obtain recent hemoglobin A1c  4.  History of alcohol abuse Withdrawal protocol and vitamin supplementation as per primary team     LOS: [place] 6/7/20201:04 PM  [place], [place]  Note: This note was prepared with Dragon dictation. Any transcription errors are unintentional

## 2019-03-18 ENCOUNTER — Encounter
Admission: EM | Disposition: A | Discharge status: Skilled Nursing Facility | Payer: Self-pay | Source: Home / Self Care | Attending: Internal Medicine

## 2019-03-18 ENCOUNTER — Inpatient Hospital Stay: Payer: Medicare Other | Admitting: Anesthesiology

## 2019-03-18 ENCOUNTER — Encounter: Payer: Self-pay | Admitting: *Deleted

## 2019-03-18 DIAGNOSIS — T8859XA Other complications of anesthesia, initial encounter: Secondary | ICD-10-CM | POA: Diagnosis present

## 2019-03-18 DIAGNOSIS — D509 Iron deficiency anemia, unspecified: Secondary | ICD-10-CM

## 2019-03-18 HISTORY — PX: ESOPHAGOGASTRODUODENOSCOPY: SHX5428

## 2019-03-18 LAB — BASIC METABOLIC PANEL
Anion gap: 11 (ref 5–15)
BUN: 35 mg/dL — ABNORMAL HIGH (ref 8–23)
CO2: 18 mmol/L — ABNORMAL LOW (ref 22–32)
Calcium: 7.8 mg/dL — ABNORMAL LOW (ref 8.9–10.3)
Chloride: 112 mmol/L — ABNORMAL HIGH (ref 98–111)
Creatinine, Ser: 3.34 mg/dL — ABNORMAL HIGH (ref 0.61–1.24)
GFR calc Af Amer: 21 mL/min — ABNORMAL LOW (ref >60)
GFR calc non Af Amer: 18 mL/min — ABNORMAL LOW (ref >60)
Glucose, Bld: 118 mg/dL — ABNORMAL HIGH (ref 70–99)
Potassium: 3.8 mmol/L (ref 3.5–5.1)
Sodium: 141 mmol/L (ref 135–145)

## 2019-03-18 LAB — CBC WITH DIFFERENTIAL/PLATELET
Abs Immature Granulocytes: 0.12 10*3/uL — ABNORMAL HIGH (ref 0.00–0.07)
Basophils Absolute: 0.1 10*3/uL (ref 0.0–0.1)
Basophils Relative: 0 %
Eosinophils Absolute: 0.2 10*3/uL (ref 0.0–0.5)
Eosinophils Relative: 2 %
HCT: 24.7 % — ABNORMAL LOW (ref 39.0–52.0)
Hemoglobin: 7.9 g/dL — ABNORMAL LOW (ref 13.0–17.0)
Immature Granulocytes: 1 %
Lymphocytes Relative: 14 %
Lymphs Abs: 1.9 10*3/uL (ref 0.7–4.0)
MCH: 34.5 pg — ABNORMAL HIGH (ref 26.0–34.0)
MCHC: 32 g/dL (ref 30.0–36.0)
MCV: 107.9 fL — ABNORMAL HIGH (ref 80.0–100.0)
Monocytes Absolute: 1.6 10*3/uL — ABNORMAL HIGH (ref 0.1–1.0)
Monocytes Relative: 12 %
Neutro Abs: 9.5 10*3/uL — ABNORMAL HIGH (ref 1.7–7.7)
Neutrophils Relative %: 71 %
Platelets: 299 10*3/uL (ref 150–400)
RBC: 2.29 MIL/uL — ABNORMAL LOW (ref 4.22–5.81)
RDW: 17.8 % — ABNORMAL HIGH (ref 11.5–15.5)
WBC: 13.4 10*3/uL — ABNORMAL HIGH (ref 4.0–10.5)
nRBC: 0.8 % — ABNORMAL HIGH (ref 0.0–0.2)

## 2019-03-18 LAB — PROTEIN / CREATININE RATIO, URINE
Creatinine, Urine: 38 mg/dL
Protein Creatinine Ratio: 1.26 mg/mg{Cre} — ABNORMAL HIGH (ref 0.00–0.15)
Total Protein, Urine: 48 mg/dL

## 2019-03-18 LAB — HEMOGLOBIN A1C
Hgb A1c MFr Bld: 7 % — ABNORMAL HIGH (ref 4.8–5.6)
Mean Plasma Glucose: 154.2 mg/dL

## 2019-03-18 LAB — PHOSPHORUS: Phosphorus: 1.8 mg/dL — ABNORMAL LOW (ref 2.5–4.6)

## 2019-03-18 LAB — GLUCOSE, CAPILLARY: Glucose-Capillary: 87 mg/dL (ref 70–99)

## 2019-03-18 LAB — MAGNESIUM: Magnesium: 1.2 mg/dL — ABNORMAL LOW (ref 1.7–2.4)

## 2019-03-18 SURGERY — EGD (ESOPHAGOGASTRODUODENOSCOPY)
Anesthesia: General | Laterality: Left

## 2019-03-18 MED ORDER — POTASSIUM PHOSPHATES 15 MMOLE/5ML IV SOLN
10.0000 mmol | Freq: Once | INTRAVENOUS | Status: AC
  Administered 2019-03-18: 10 mmol via INTRAVENOUS
  Filled 2019-03-18: qty 3.33

## 2019-03-18 MED ORDER — SODIUM CHLORIDE 0.9 % IV SOLN: INTRAVENOUS | Status: DC

## 2019-03-18 MED ORDER — PEG 3350-KCL-NA BICARB-NACL 420 G PO SOLR
4000.0000 mL | Freq: Once | ORAL | Status: DC
  Filled 2019-03-18 (×3): qty 4000

## 2019-03-18 MED ORDER — MIDAZOLAM HCL 2 MG/2ML IJ SOLN
INTRAMUSCULAR | Status: AC
  Filled 2019-03-18: qty 2

## 2019-03-18 MED ORDER — PROPOFOL 500 MG/50ML IV EMUL
INTRAVENOUS | Status: DC | PRN
  Administered 2019-03-18: 120 ug/kg/min via INTRAVENOUS

## 2019-03-18 MED ORDER — LIDOCAINE 2% (20 MG/ML) 5 ML SYRINGE
INTRAMUSCULAR | Status: DC | PRN
  Administered 2019-03-18: 25 mg via INTRAVENOUS

## 2019-03-18 MED ORDER — GLYCOPYRROLATE 0.2 MG/ML IJ SOLN
INTRAMUSCULAR | Status: AC
  Filled 2019-03-18: qty 1

## 2019-03-18 MED ORDER — PROPOFOL 10 MG/ML IV BOLUS
INTRAVENOUS | Status: DC | PRN
  Administered 2019-03-18 (×2): 50 mg via INTRAVENOUS

## 2019-03-18 MED ORDER — LIDOCAINE HCL (PF) 2 % IJ SOLN
INTRAMUSCULAR | Status: AC
  Filled 2019-03-18: qty 10

## 2019-03-18 MED ORDER — PROPOFOL 500 MG/50ML IV EMUL
INTRAVENOUS | Status: AC
  Filled 2019-03-18: qty 100

## 2019-03-18 MED ORDER — GLYCOPYRROLATE 0.2 MG/ML IJ SOLN
INTRAMUSCULAR | Status: DC | PRN
  Administered 2019-03-18: 0.2 mg via INTRAVENOUS

## 2019-03-18 MED ORDER — MAGNESIUM SULFATE 2 GM/50ML IV SOLN
2.0000 g | Freq: Once | INTRAVENOUS | Status: AC
  Administered 2019-03-18: 2 g via INTRAVENOUS
  Filled 2019-03-18: qty 50

## 2019-03-18 NOTE — Care Management Important Message (Signed)
Important Message  Patient Details  Name: Edward Singleton MRN: [id number] Date of Birth: 1951-03-01   Medicare Important Message Given:  Yes    Dannette Barbara 03/18/2019, 11:44 AM

## 2019-03-18 NOTE — Anesthesia Preprocedure Evaluation (Addendum)
Anesthesia Evaluation  Patient identified by MRN, date of birth, ID band Patient awake    Reviewed: Allergy & Precautions, H&P , NPO status , Patient's Chart, lab work & pertinent test results  Airway Mallampati: II  TM Distance: >3 FB     Dental  (+) Edentulous Upper, Poor Dentition   Pulmonary Current Smoker,           Cardiovascular hypertension, (-) angina+ CAD and + Peripheral Vascular Disease  (-) Past MI      Neuro/Psych PSYCHIATRIC DISORDERS Dementia negative neurological ROS     GI/Hepatic negative GI ROS, (+)     substance abuse (h/o alcohol, BZD, opioid abuse)  alcohol use,   Endo/Other  diabetes  Renal/GU CRFRenal disease  negative genitourinary   Musculoskeletal   Abdominal   Peds  Hematology  (+) Blood dyscrasia, anemia ,   Anesthesia Other Findings Past Medical History: No date: Alcohol abuse No date: Atrophic kidney No date: Benign enlargement of prostate No date: Dementia ([place])     Comment:  due to alcohol No date: Diabetes mellitus without complication (HCC) No date: Gout No date: Gout No date: Hypercholesteremia No date: Hypertension No date: Kidney stones No date: Left flank pain, chronic 11/17/2014: Pelvic fracture [facility])  Past Surgical History: 2013: left renal stent placement No date: LITHOTRIPSY 11/18/2014: ORIF HUMERUS FRACTURE; Left     Comment:  Procedure: OPEN REDUCTION INTERNAL FIXATION (ORIF)               DISTAL HUMERUS FRACTURE;  Surgeon: Rozanna Box, MD;                Location: [place];  Service: Orthopedics;  Laterality:               Left; No date: SPLENECTOMY  BMI    Body Mass Index:  23.84 kg/m      Reproductive/Obstetrics negative OB ROS                           Anesthesia Physical Anesthesia Plan  ASA: III  Anesthesia Plan: General   Post-op Pain Management:    Induction:   PONV Risk Score and Plan: Propofol infusion and  TIVA  Airway Management Planned: Natural Airway and Nasal Cannula  Additional Equipment:   Intra-op Plan:   Post-operative Plan:   Informed Consent: I have reviewed the patients History and Physical, chart, labs and discussed the procedure including the risks, benefits and alternatives for the proposed anesthesia with the patient or authorized representative who has indicated his/her understanding and acceptance.     Dental Advisory Given  Plan Discussed with: Anesthesiologist and CRNA  Anesthesia Plan Comments:        Anesthesia Quick Evaluation

## 2019-03-18 NOTE — Progress Notes (Addendum)
[place] [facility] [place] NAME: Edward Singleton    MR#:  [id number]  DATE OF BIRTH:  24-Apr-1951  SUBJECTIVE:   Chief Complaint  Patient presents with  . Fall  . Alcohol Problem   Patient seen at the bedside s/p EGD. He report that he is feeling fine. Voices no concerns.  REVIEW OF SYSTEMS:  Review of Systems  Constitutional: Positive for weight loss. Negative for chills, fever and malaise/fatigue.  HENT: Negative for congestion, hearing loss and sore throat.   Eyes: Negative for blurred vision and double vision.  Respiratory: Negative for cough, shortness of breath and wheezing.   Cardiovascular: Negative for chest pain, palpitations, orthopnea and leg swelling.  Gastrointestinal: Negative for abdominal pain, diarrhea, nausea and vomiting.  Genitourinary: Negative for dysuria and urgency.  Musculoskeletal: Positive for falls and joint pain. Negative for myalgias.  Skin: Negative for rash.       Multiple bruising in upper and lower extremity  Neurological: Negative for dizziness, sensory change, speech change, focal weakness and headaches.  Endo/Heme/Allergies: Bruises/bleeds easily.  Psychiatric/Behavioral: Positive for memory loss and substance abuse. Negative for depression. The patient has insomnia.    DRUG ALLERGIES:  No Known Allergies VITALS:  Blood pressure (!) 141/88, pulse 99, temperature 97.7 F (36.5 C), resp. rate 18, height 5\' 7"  (1.702 m), weight 68 kg, SpO2 97 %. PHYSICAL EXAMINATION:   GENERAL:  68 y.o.-year-old patient lying in the bed with no acute distress.  EYES: Pupils equal, round, reactive to light and accommodation. No scleral icterus. Extraocular muscles intact.  HEENT: Head atraumatic, normocephalic. Oropharynx and nasopharynx clear.  NECK:  Supple, no jugular venous distention. No thyroid enlargement, no tenderness.  LUNGS: Normal breath sounds bilaterally, no wheezing, rales,rhonchi or crepitation. No use of  accessory muscles of respiration.  CARDIOVASCULAR: S1, S2 normal. No murmurs, rubs, or gallops.  ABDOMEN: Soft, nontender, nondistended. Bowel sounds present. No organomegaly or mass.  EXTREMITIES: No pedal edema, cyanosis, or clubbing. No rash or lesions. + pedal pulses MUSCULOSKELETAL: Normal bulk, and power was 5+ grip and elbow, knee, and ankle flexion and extension bilaterally.  NEUROLOGIC:Alert and oriented x 3. CN 2-12 intact. Sensation to light touch and cold stimuli intact bilaterally. Finger to nose nl. Babinski is downgoing. DTR's (biceps, patellar, and achilles) 2+ and symmetric throughout. Gait not tested due to safety concern. PSYCHIATRIC: The patient is alert and oriented x 3.  SKIN: Multiple bruising in bilateral upper extremities  DATA REVIEWED:  LABORATORY PANEL:  Male CBC Recent Labs  Lab 03/18/19 0515  WBC 13.4*  HGB 7.9*  HCT 24.7*  PLT 299   ------------------------------------------------------------------------------------------------------------------ Chemistries  Recent Labs  Lab 03/14/19 1506  03/18/19 0515  NA 129*   < > 141  K 2.9*   < > 3.8  CL 88*   < > 112*  CO2 16*   < > 18*  GLUCOSE 121*   < > 118*  BUN 94*   < > 35*  CREATININE 5.83*   < > 3.34*  CALCIUM 9.1   < > 7.8*  MG  --    < > 1.2*  AST 23  --   --   ALT 15  --   --   ALKPHOS 139*  --   --   BILITOT 1.0  --   --    < > = values in this interval not displayed.   RADIOLOGY:  No results found. ASSESSMENT AND PLAN:  68 y.o. male past medical history of EtOH abuse, diabetes, hypertension, hyperlipidemia, gout, nephrolithiasis, CKD stage III, COPD, and memory loss presenting with frequent falls.  1. 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 Severe anemia -likely multifactorial in the setting of AKI, bone marrow suppression and malnutrition from heavy alcohol use -* s/p EGD today showing duodenitis, Gastritis and LA grade D reflux esophagitis - GI recommends colonoscopy tomorrow  - Hemoglobin 7.4 today, will transfuse if H<7 - Continue to monitor H&H - GI input appreciated  3. Acute on chronic kidney disease -patient with history of atrophic left kidney on renal ultrasound - Renal function continues to improved with IVFs - Avoid nephrotoxins - Continue to monitor renal function - Nephrology input appreciated  4. Hypokalemia-improved with supplement, mag normal  5. EtOH Abuse/ Withdrawal  - Daily Thiamine, Folate, MVI and B12 supplements - Insomnia: Trazodone - SW consult for cessation resources - PT/OT evaluation for mobility - CIWA +/- Standing Protocol  6. DM: His sugars have been well-controlled on current regimen  - Holding  metformin in house - Low intensity sliding scale insulin coverage - ADA 2100 calorie diet  - FS BS qac and qhs   7.  Tobacco abuse - Smoking cessation counseling - Nicotine patch  8. DVT prophylaxis - Hold anti-coagulation for anemia  9.  Hyponatremia - likely due to beer potomania.  - Improving  10. 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  Spoke with Chauncy Passy, patient's sister and updated her on plan of care today 03/18/19  All the records are reviewed and case discussed with Care Management/Social Worker. Management plans discussed with the patient, family and they are in agreement.  CODE STATUS: Full Code  TOTAL TIME TAKING CARE OF THIS PATIENT: 38 minutes.   More than 50% of the time was spent in counseling/coordination of care: YES  POSSIBLE D/C IN  1 DAYS, DEPENDING ON CLINICAL CONDITION.   on 03/18/2019 at 10:16 AM  This patient was staffed with Dr. Valetta Fuller, [facility] who personally evaluated patient, reviewed documentation and agreed with assessment and plan of care as above.  Rufina Falco, DNP, FNP-BC [facility] Nurse Practitioner   Between 7am to 6pm - Pager [phone]  After 6pm go to [email or web address] - Technical brewer [facility]  Office  [phone]  CC: Primary care physician; Windy Fast, MD  Note: This dictation was prepared with Dragon dictation along with smaller phrase technology. Any transcriptional errors that result from this process are unintentional.

## 2019-03-18 NOTE — Progress Notes (Signed)
Patient with 13beat run Tanna Furry, MD notified, will continue to monitor and assess

## 2019-03-18 NOTE — TOC Progression Note (Signed)
Transition of Care [facility]) - Progression Note    Patient Details  Name: Edward Singleton MRN: [id number] Date of Birth: 1950-10-24  Transition of Care [facility]) CM/SW Contact  Ross Ludwig, [place] Phone Number: 03/18/2019, 2:25 PM  Clinical Narrative:     CSW presented bed offers to patient and he chose [facility].  CSW contacted [facility] and they can accept patient once he is medically ready for discharge.  Expected Discharge Plan: [place] Barriers to Discharge: Continued Medical Work up  Expected Discharge Plan and Services Expected Discharge Plan: [place] In-house Referral: NA   Post Acute Care Choice: [place] Living arrangements for the past 2 months: Mobile Home                 DME Arranged: N/A DME Agency: NA       HH Arranged: NA HH Agency: NA         Social Determinants of Health (SDOH) Interventions    Readmission Risk Interventions Readmission Risk Prevention Plan 03/15/2019  Transportation Screening Complete  PCP or Specialist Appt within 3-5 Days Complete  HRI or [place] Complete  Social Work Consult for [place] Planning/Counseling Complete  Palliative Care Screening Complete  Medication Review Press photographer) Complete  Some recent data might be hidden

## 2019-03-18 NOTE — Anesthesia Post-op Follow-up Note (Signed)
Anesthesia QCDR form completed.        

## 2019-03-18 NOTE — Progress Notes (Signed)
Nutrition Follow Up Note   DOCUMENTATION CODES:   Not applicable  INTERVENTION:   Ensure Enlive po TID, each supplement provides 350 kcal and 20 grams of protein  Vitamin C 552m po BID  MVI, thiamine and folic acid in setting of etoh abuse  Pt at high refeed risk; recommend monitor K, Mg and P labs daily until stable   NUTRITION DIAGNOSIS:   Predicted suboptimal nutrient intake related to social / environmental circumstances(etoh abuse) as evidenced by other (comment)(per chart review )  GOAL:   Patient will meet greater than or equal to 90% of their needs  -not met   MONITOR:   PO intake, Supplement acceptance, Labs, Weight trends, I & O's, Skin  ASSESSMENT:   68year old male patient with a known history of alcohol abuse, prostate hypertrophy, alcoholic dementia, type 2 diabetes mellitus, gout, hyperlipidemia, hypertension, nephrolithiasis presented to the emergency room for frequent falls.    RD working remotely.  Pt s/p EGD today; pt noted to have hiatal hernia, duodenitis and gastritis  Pt advanced to regular diet on 6/5; pt was eating 30-100% of meals prior to NPO for EGD; pt is now on clear liquid diet today. Recommend continue supplements and MVI. GI recommending colonoscopy tomorrow per MD note. Per chart, pt's weight is stable. Pt continues to refeed; electrolytes being replaced.   Medications reviewed and include: allopurinol, aspirin, MVI, nicotine, protonix, thiamine, vitamin C, NaCl w/ KCl _0 /hr, K Phos  Labs reviewed: Na 141 wnl, K 3.8 wnl, BUN 35(H), creat 3.34(H), P 1.8(L), Mg 1.2(L) Iron 34(L), TIBC 262, ferritin 135, folate 41.0, B12 163(L)- 6/5 Wbc- 13.4(H), Hgb 7.9(L), Hct 24.7(L), MCV 107.9(H), MCH 34.5(H)  Diet Order:   Diet Order            Diet NPO time specified  Diet effective midnight        Diet clear liquid Room service appropriate? Yes; Fluid consistency: Thin  Diet effective now             EDUCATION NEEDS:   Not  appropriate for education at this time  Skin:  Skin Assessment: Reviewed RN Assessment(inner aspect of left forearm: 10cm x 2.5cm x 0.2cm, Left hand: affected area measures 3cm x 4cm with area of depth measuring 0.1cm)  Last BM:  pta  Height:   Ht Readings from Last 1 Encounters:  03/18/19 5' 7" (1.702 m)    Weight:   Wt Readings from Last 1 Encounters:  03/18/19 68 kg    Ideal Body Weight:  67 kg  BMI:  Body mass index is 23.49 kg/m.  Estimated Nutritional Needs:   Kcal:  1700-2000kcal/day   Protein:  85-100g/day   Fluid:  >1.5L/day   CKoleen DistanceMS, RD, LDN Pager #- [phone]#- [phone] Hours Pager: [phone]

## 2019-03-18 NOTE — Transfer of Care (Signed)
Immediate Anesthesia Transfer of Care Note  Patient: Edward Singleton  Procedure(s) Performed: ESOPHAGOGASTRODUODENOSCOPY (EGD) (Left )  Patient Location: Endoscopy Unit  Anesthesia Type:General  Level of Consciousness: awake and alert   Airway & Oxygen Therapy: Patient Spontanous Breathing and Patient connected to nasal cannula oxygen  Post-op Assessment: Report given to RN and Post -op Vital signs reviewed and stable  Post vital signs: Reviewed  Last Vitals:  Vitals Value Taken Time  BP 90/60 03/18/2019  9:20 AM  Temp 36.5 C 03/18/2019  9:20 AM  Pulse 88 03/18/2019  9:20 AM  Resp 14 03/18/2019  9:20 AM  SpO2 98 % 03/18/2019  9:20 AM    Last Pain:  Vitals:   03/18/19 0910  TempSrc: Tympanic  PainSc:       Patients Stated Pain Goal: 1 (62/69/48 5462)  Complications: No apparent anesthesia complications

## 2019-03-18 NOTE — Op Note (Signed)
[facility] Patient Name: Edward Singleton Procedure Date: 03/18/2019 8:42 AM MRN: [id number] Account #: [id number] Date of Birth: [birth year] Admit Type: Inpatient Age: 68 Room: [facility] ENDO ROOM 4 Gender: Male Note Status: Finalized Procedure:            Upper GI endoscopy Indications:          Iron deficiency anemia Providers:            Jonathon Bellows MD, MD Referring MD:         Windy Fast (Referring MD) Medicines:            Monitored Anesthesia Care Complications:        No immediate complications. Procedure:            Pre-Anesthesia Assessment:                       - Prior to the procedure, a History and Physical was                        performed, and patient medications, allergies and                        sensitivities were reviewed. The patient's tolerance of                        previous anesthesia was reviewed.                       - The risks and benefits of the procedure and the                        sedation options and risks were discussed with the                        patient. All questions were answered and informed                        consent was obtained.                       - ASA Grade Assessment: III - A patient with severe                        systemic disease.                       After obtaining informed consent, the endoscope was                        passed under direct vision. Throughout the procedure,                        the patient's blood pressure, pulse, and oxygen                        saturations were monitored continuously. The Endoscope                        was introduced through the mouth, and advanced to the  third part of duodenum. The upper GI endoscopy was                        accomplished with ease. The patient tolerated the                        procedure well. Findings:      Localized moderate inflammation characterized by congestion (edema) and       erythema  was found in the first portion of the duodenum.      A medium-sized hiatal hernia was present.      Localized moderate inflammation characterized by congestion (edema) and       erythema was found in the gastric antrum. Biopsies were taken with a       cold forceps for histology.      LA Grade D (one or more mucosal breaks involving at least 75% of       esophageal circumference) esophagitis with no bleeding was found in the       lower third of the esophagus.      The cardia and gastric fundus were normal on retroflexion. Impression:           - Duodenitis.                       - Medium-sized hiatal hernia.                       - Gastritis. Biopsied.                       - LA Grade D reflux esophagitis. Recommendation:       - Return patient to hospital ward for ongoing care.                       - I suggest colonoscopy tomorrow to evaluate possibly                        anemia from iron/b12 deficiency and element of chroinic                        disease- if he is not willing to undergo a colonoscopy                        then he can eat and drink .                       Suggest prilosec 40 mg BID, repeat EGD in 8 weeks to                        check for healing of esophagitis Procedure Code(s):    --- Professional ---                       [id number], Esophagogastroduodenoscopy, flexible, transoral;                        with biopsy, single or multiple Diagnosis Code(s):    --- Professional ---                       K29.80, Duodenitis without bleeding  K44.9, Diaphragmatic hernia without obstruction or                        gangrene                       K29.70, Gastritis, unspecified, without bleeding                       K21.0, Gastro-esophageal reflux disease with esophagitis                       D50.9, Iron deficiency anemia, unspecified CPT copyright 2019 American Medical Association. All rights reserved. The codes documented in this report are  preliminary and upon coder review may  be revised to meet current compliance requirements. Jonathon Bellows, MD Jonathon Bellows MD, MD 03/18/2019 9:17:07 AM This report has been signed electronically. Number of Addenda: 0 Note Initiated On: 03/18/2019 8:42 AM Estimated Blood Loss: Estimated blood loss: none.      [facility]

## 2019-03-18 NOTE — Progress Notes (Signed)
Pt anxious about left arm dressing. He states it is heavy and wants it off. This nurse rewrapped dressing with lighter gauze, pt currently satisfied with new dressing. Pt has expressed that he would rather not have a dressing at all, pt educated on risk and benefit of keeping skin tear/wound covered per wound orders.

## 2019-03-18 NOTE — Anesthesia Postprocedure Evaluation (Signed)
Anesthesia Post Note  Patient: Edward Singleton  Procedure(s) Performed: ESOPHAGOGASTRODUODENOSCOPY (EGD) (Left )  Patient location during evaluation: PACU Anesthesia Type: General Level of consciousness: awake and alert Pain management: pain level controlled Vital Signs Assessment: post-procedure vital signs reviewed and stable Respiratory status: spontaneous breathing, nonlabored ventilation and respiratory function stable Cardiovascular status: blood pressure returned to baseline and stable Postop Assessment: no apparent nausea or vomiting Anesthetic complications: no     Last Vitals:  Vitals:   03/18/19 0940 03/18/19 1310  BP: (!) 141/88 127/81  Pulse: 99 (!) 109  Resp: 18   Temp:    SpO2: 97%     Last Pain:  Vitals:   03/18/19 1042  TempSrc:   PainSc: 0-No pain                 Durenda Hurt

## 2019-03-18 NOTE — Progress Notes (Signed)
Patient is refusing to drink his Go Lytely. I explained to him that he would not be able to get his procedure done to see the source of bleeding and patient stated "ok I'm fine with that". Will continue to monitor.

## 2019-03-18 NOTE — Progress Notes (Signed)
Pt currently out of room for upper endoscopy at this time. Will re-attempt next available time/date. Greggory Stallion, PT, DPT [phone]

## 2019-03-18 NOTE — Consult Note (Addendum)
PHARMACY CONSULT NOTE - FOLLOW UP  Pharmacy Consult for Electrolyte Monitoring and Replacement   Recent Labs: Potassium (mmol/L)  Date Value  03/18/2019 3.8  01/16/2014 5.0   Magnesium (mg/dL)  Date Value  03/18/2019 1.2 (L)  01/15/2014 1.2 (L)   Calcium (mg/dL)  Date Value  03/18/2019 7.8 (L)   Calcium, Total (mg/dL)  Date Value  01/16/2014 8.5   Albumin (g/dL)  Date Value  03/14/2019 4.2  10/16/2015 4.7   Phosphorus (mg/dL)  Date Value  03/18/2019 1.8 (L)   Sodium (mmol/L)  Date Value  03/18/2019 141  08/11/2015 136 (A)  01/16/2014 139  Corrected Calcium: 7.96   Assessment: Pt presented with known history of alcohol abuse, and had a fall. K+ 3.8. Mg 1.2. Phosphorus 1.8. Ca is 7.8 with a albumin of 4.2. Pt is on 0.9 NS w/ KCl 20 mEq infusion.   Renal function slowly improving: Scr 3.88>>3.47 >>3.34  Goal of Therapy:  Electrolytes WNL.   Plan:  Will order magnesium IV 4 g x 1 dose. Update: provider Stark Klein, Bing Neighbors, NP) ordered magnesium 2g x1 dose.  Will order Potassium Phosphate 10 mmol x1 dose.  Recheck levels with AM labs.   Rowland Lathe ,PharmD Clinical Pharmacist 03/18/2019 7:13 AM

## 2019-03-18 NOTE — Progress Notes (Signed)
[facility], [place] 03/18/19  Subjective:  Patient resting in bed. Creatinine still remains above baseline at 3.3. Complaining of pain in his left upper extremity.   Objective:  Vital signs in last 24 hours:  Temp:  [97.3 F (36.3 C)-98.6 F (37 C)] 97.7 F (36.5 C) (06/08 0920) Pulse Rate:  [84-111] 109 (06/08 1310) Resp:  [14-20] 18 (06/08 0940) BP: (80-141)/(60-93) 127/81 (06/08 1310) SpO2:  [92 %-98 %] 97 % (06/08 0940) Weight:  [68 kg] 68 kg (06/08 0830)  Weight change:  Filed Weights   03/16/19 0421 03/17/19 0250 03/18/19 0830  Weight: 61.4 kg 69 kg 68 kg    Intake/Output:    Intake/Output Summary (Last 24 hours) at 03/18/2019 1649 Last data filed at 03/18/2019 1207 Gross per 24 hour  Intake 200 ml  Output 100 ml  Net 100 ml     Physical Exam: General:  No acute distress, laying in the bed  HEENT  anicteric, moist oral mucous membranes  Neck  supple  Pulm/lungs  clear to auscultation bilateral, normal effort  CVS/Heart  no rub or gallop  Abdomen:   Soft, nontender  Extremities:  1+ lower extremity edema  Neurologic:  Alert, able to follow simple commands  Skin:  Normal turgor    Basic Metabolic Panel:  Recent Labs  Lab 03/14/19 1506 03/14/19 2017 03/15/19 0530 03/16/19 0425 03/17/19 0458 03/18/19 0515  NA 129*  --  130* 133* 139 141  K 2.9*  --  3.0* 4.2 4.2 3.8  CL 88*  --  97* 109 113* 112*  CO2 16*  --  17* 17* 19* 18*  GLUCOSE 121*  --  148* 123* 136* 118*  BUN 94*  --  90* 64* Edward* 35*  CREATININE 5.83*  --  4.89* 3.88* 3.Edward* 3.34*  CALCIUM 9.1  --  7.9* 7.9* 8.0* 7.8*  MG  --  1.6* 1.7 1.9  --  1.2*  PHOS  --   --   --  3.1  --  1.8*     CBC: Recent Labs  Lab 03/14/19 1506  03/15/19 0530 03/15/19 1913 03/16/19 0425 03/17/19 0458 03/18/19 0515  WBC 18.7*  --  14.9*  --  13.7* 13.4* 13.4*  NEUTROABS 15.4*  --   --   --  10.3* 9.4* 9.5*  HGB 9.5*   < > 7.1* 8.0* 7.8* 7.4* 7.9*  HCT 26.7*   < > 20.6*  24.3* 23.1* 22.6* 24.7*  MCV 96.4  --  98.6  --  101.8* 105.1* 107.9*  PLT 303  --  232  --  215 244 299   < > = values in this interval not displayed.     No results found for: HEPBSAG, HEPBSAB, HEPBIGM    Microbiology:  Recent Results (from the past 240 hour(s))  SARS Coronavirus 2 (CEPHEID - Performed in [place] hospital lab), Hosp Order     Status: None   Collection Time: 03/14/19  6:11 PM  Result Value Ref Range Status   SARS Coronavirus 2 NEGATIVE NEGATIVE Final    Comment: (NOTE) If result is NEGATIVE SARS-CoV-2 target nucleic acids are NOT DETECTED. The SARS-CoV-2 RNA is generally detectable in upper and lower  respiratory specimens during the acute phase of infection. The lowest  concentration of SARS-CoV-2 viral copies this assay can detect is 250  copies / mL. A negative result does not preclude SARS-CoV-2 infection  and should not be used as the sole basis for treatment  or other  patient management decisions.  A negative result may occur with  improper specimen collection / handling, submission of specimen other  than nasopharyngeal swab, presence of viral mutation(s) within the  areas targeted by this assay, and inadequate number of viral copies  (<250 copies / mL). A negative result must be combined with clinical  observations, patient history, and epidemiological information. If result is POSITIVE SARS-CoV-2 target nucleic acids are DETECTED. The SARS-CoV-2 RNA is generally detectable in upper and lower  respiratory specimens dur ing the acute phase of infection.  Positive  results are indicative of active infection with SARS-CoV-2.  Clinical  correlation with patient history and other diagnostic information is  necessary to determine patient infection status.  Positive results do  not rule out bacterial infection or co-infection with other viruses. If result is PRESUMPTIVE POSTIVE SARS-CoV-2 nucleic acids MAY BE PRESENT.   A presumptive positive result  was obtained on the submitted specimen  and confirmed on repeat testing.  While 2019 novel coronavirus  (SARS-CoV-2) nucleic acids may be present in the submitted sample  additional confirmatory testing may be necessary for epidemiological  and / or clinical management purposes  to differentiate between  SARS-CoV-2 and other Sarbecovirus currently known to infect humans.  If clinically indicated additional testing with an alternate test  methodology [phone]) is advised. The SARS-CoV-2 RNA is generally  detectable in upper and lower respiratory sp ecimens during the acute  phase of infection. The expected result is Negative. Fact Sheet for Patients:  StrictlyIdeas.no Fact Sheet for Healthcare Providers: BankingDealers.co.za This test is not yet approved or cleared by the [place] FDA and has been authorized for detection and/or diagnosis of SARS-CoV-2 by FDA under an Emergency Use Authorization (EUA).  This EUA will remain in effect (meaning this test can be used) for the duration of the COVID-19 declaration under Section 564(b)(1) of the Act, 21 U.S.C. section 360bbb-3(b)(1), unless the authorization is terminated or revoked sooner. Performed at [facility], [address] [place], [place] [address]     Coagulation Studies: No results for input(s): LABPROT, INR in the last 72 hours.  Urinalysis: Recent Labs    03/17/19 1213  COLORURINE STRAW*  LABSPEC 1.008  PHURINE 5.0  GLUCOSEU NEGATIVE  HGBUR SMALL*  BILIRUBINUR NEGATIVE  KETONESUR NEGATIVE  PROTEINUR NEGATIVE  NITRITE NEGATIVE  LEUKOCYTESUR NEGATIVE      Imaging: No results found.   Medications:   . sodium chloride 0 mL/hr at 03/15/19 1857  . sodium chloride    . 0.9 % NaCl with KCl 20 mEq / L 100 mL/hr at 03/18/19 1644  . potassium PHOSPHATE IVPB (in mmol) 10 mmol (03/18/19 1303)  . thiamine injection     . allopurinol  100 mg Oral Daily   . aspirin  81 mg Oral Daily  . escitalopram  10 mg Oral Daily  . feeding supplement (ENSURE ENLIVE)  237 mL Oral TID BM  . folic acid  1 mg Oral Daily  . LORazepam  0-4 mg Intravenous Q12H  . midodrine  10 mg Oral TID WC  . multivitamin with minerals  1 tablet Oral Daily  . nicotine  21 mg Transdermal Daily  . pantoprazole (PROTONIX) IV  40 mg Intravenous Q12H  . polyethylene glycol-electrolytes  4,000 mL Oral Once  . rosuvastatin  20 mg Oral Daily  . [START ON 03/23/2019] thiamine  100 mg Oral Daily  . traZODone  50 mg Oral Daily  . vitamin C  500 mg Oral BID   sodium chloride, acetaminophen **OR** acetaminophen, HYDROmorphone (DILAUDID) injection, ondansetron **OR** ondansetron (ZOFRAN) IV, oxyCODONE-acetaminophen, senna-docusate  Assessment/ Plan:  68 y.o. Singleton with alcohol abuse, atrophic left kidney, history of BPH, diabetes, gout, hypertension, history of kidney stones, coronary artery disease and atherosclerosis noted on CT abdomen,, was admitted on 03/14/2019 after he was found down from a fall.   1.  Acute kidney injury Renal function appears to be improving slowly.  Baseline creatinine is 1.5 and creatinine currently 3.3.  Continue IV fluid hydration.  Follow-up renal parameters tomorrow.  2.  Chronic kidney disease stage III.  With atrophic left kidney and history of multiple kidney stones and history of lithotripsy Baseline creatinine 1.5/GFR Edward Hematuria and proteinuria noted on previous urinalysis   3.  Diabetes type 2 with chronic kidney disease Lab Results  Component Value Date   HGBA1C 7.0 (H) 03/16/2019  Hemoglobin A1c currently at target at 7.0.  Glycemic control as per hospitalist.  4.  History of alcohol abuse Withdrawal protocol and vitamin supplementation as per primary team     LOS: 4 Amiya Escamilla 6/8/20204:49 PM  [facility] [place], [place]  Note: This note was prepared with Dragon dictation. Any  transcription errors are unintentional

## 2019-03-18 NOTE — H&P (Signed)
Jonathon Bellows, MD [address], [place], [place], [place], [address] [address], [place], [place], [place], [address] Phone: [phone]  Fax: [phone]  Primary Care Physician:  Windy Fast, MD   Pre-Procedure History & Physical: HPI:  Edward Singleton is a 68 y.o. male is here for an endoscopy    Past Medical History:  Diagnosis Date  . Alcohol abuse   . Atrophic kidney   . Benign enlargement of prostate   . 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 Diabetes mellitus without complication ([place])   . Gout   . Gout   . Hypercholesteremia   . Hypertension   . Kidney stones   . Left flank pain, chronic   . Pelvic fracture ([place]) 11/17/2014    Past Surgical History:  Procedure Laterality Date  . left renal stent placement  2013  . LITHOTRIPSY    . ORIF HUMERUS FRACTURE Left 11/18/2014   Procedure: OPEN REDUCTION INTERNAL FIXATION (ORIF) DISTAL HUMERUS FRACTURE;  Surgeon: Rozanna Box, MD;  Location: [place];  Service: Orthopedics;  Laterality: Left;  . SPLENECTOMY      Prior to Admission medications   Medication Sig Start Date End Date Taking? Authorizing Provider  allopurinol (ZYLOPRIM) 100 MG tablet Take 100 mg by mouth daily.   Yes [provider]  amLODipine (NORVASC) 5 MG tablet Take 5 mg by mouth daily.   Yes [provider]  aspirin 81 MG chewable tablet Chew 81 mg by mouth daily.   Yes [provider]  escitalopram (LEXAPRO) 10 MG tablet Take 10 mg by mouth daily.   Yes [provider]  folic acid (FOLVITE) 1 MG tablet Take 1 mg by mouth daily.   Yes [provider]  metFORMIN (GLUCOPHAGE) 500 MG tablet Take 500 mg by mouth 2 (two) times a day.   Yes [provider]  metoprolol succinate (TOPROL-XL) 25 MG 24 hr tablet Take 25 mg by mouth daily.   Yes [provider]  rosuvastatin (CRESTOR) 20 MG tablet Take 20 mg by mouth daily.   Yes [provider]  thiamine (VITAMIN B-1) 100 MG tablet  Take 100 mg by mouth daily.   Yes [provider]  traZODone (DESYREL) 50 MG tablet Take 50 mg by mouth daily.   Yes [provider]    Allergies as of 03/14/2019  . (No Known Allergies)    Family History  Problem Relation Age of Onset  . Heart disease Mother   . Heart disease Father   . Diabetes Father   . Nephrolithiasis Paternal Grandfather   . Kidney disease Neg Hx   . Prostate cancer Neg Hx     Social History   Socioeconomic History  . Marital status: Single    Spouse name: Not on file  . Number of children: Not on file  . Years of education: Not on file  . Highest education level: Not on file  Occupational History  . Not on file  Social Needs  . Financial resource strain: Not on file  . Food insecurity:    Worry: Not on file    Inability: Not on file  . Transportation needs:    Medical: Not on file    Non-medical: Not on file  Tobacco Use  . Smoking status: Current Every Day Smoker    Packs/day: 1.00    Years: 50.00    Pack years: 50.00    Types: Cigarettes  .  Smokeless tobacco: Current User    Types: Chew  Substance and Sexual Activity  . Alcohol use: Yes    Alcohol/week: 42.0 standard drinks    Types: 42 Cans of beer per week  . Drug use: No  . Sexual activity: Not on file  Lifestyle  . Physical activity:    Days per week: Not on file    Minutes per session: Not on file  . Stress: Not on file  Relationships  . Social connections:    Talks on phone: Not on file    Gets together: Not on file    Attends religious service: Not on file    Active member of club or organization: Not on file    Attends meetings of clubs or organizations: Not on file    Relationship status: Not on file  . Intimate partner violence:    Fear of current or ex partner: Not on file    Emotionally abused: Not on file    Physically abused: Not on file    Forced sexual activity: Not on file  Other Topics Concern  . Not on file  Social History Narrative   . Not on file    Review of Systems: See HPI, otherwise negative ROS  Physical Exam: BP 114/78   Pulse 84   Temp (!) 97.4 F (36.3 C) (Tympanic)   Resp 18   Ht 5\' 7"  (1.702 m)   Wt 68 kg   SpO2 92%   BMI 23.49 kg/m  General:   Alert,  pleasant and cooperative in NAD Head:  Normocephalic and atraumatic. Neck:  Supple; no masses or thyromegaly. Lungs:  Clear throughout to auscultation, normal respiratory effort.    Heart:  +S1, +S2, Regular rate and rhythm, No edema. Abdomen:  Soft, nontender and nondistended. Normal bowel sounds, without guarding, and without rebound.   Neurologic:  Alert and  oriented x4;  grossly normal neurologically.  Impression/Plan: Edward Singleton is here for an endoscopy  to be performed for  evaluation of gi bleed    Risks, benefits, limitations, and alternatives regarding endoscopy have been reviewed with the patient.  Questions have been answered.  All parties agreeable.   Jonathon Bellows, MD  03/18/2019, 8:38 AM

## 2019-03-19 ENCOUNTER — Encounter: Payer: Self-pay | Admitting: Gastroenterology

## 2019-03-19 ENCOUNTER — Encounter
Admission: EM | Disposition: A | Discharge status: Skilled Nursing Facility | Payer: Self-pay | Source: Home / Self Care | Attending: Internal Medicine

## 2019-03-19 LAB — BASIC METABOLIC PANEL
Anion gap: 7 (ref 5–15)
Anion gap: 11 (ref 5–15)
BUN: 25 mg/dL — ABNORMAL HIGH (ref 8–23)
BUN: 23 mg/dL (ref 8–23)
CO2: 20 mmol/L — ABNORMAL LOW (ref 22–32)
CO2: 17 mmol/L — ABNORMAL LOW (ref 22–32)
Calcium: 7.7 mg/dL — ABNORMAL LOW (ref 8.9–10.3)
Calcium: 8 mg/dL — ABNORMAL LOW (ref 8.9–10.3)
Chloride: 114 mmol/L — ABNORMAL HIGH (ref 98–111)
Chloride: 112 mmol/L — ABNORMAL HIGH (ref 98–111)
Creatinine, Ser: 2.89 mg/dL — ABNORMAL HIGH (ref 0.61–1.24)
Creatinine, Ser: 2.78 mg/dL — ABNORMAL HIGH (ref 0.61–1.24)
GFR calc Af Amer: 25 mL/min — ABNORMAL LOW (ref >60)
GFR calc Af Amer: 26 mL/min — ABNORMAL LOW (ref >60)
GFR calc non Af Amer: 22 mL/min — ABNORMAL LOW (ref >60)
GFR calc non Af Amer: 23 mL/min — ABNORMAL LOW (ref >60)
Glucose, Bld: 109 mg/dL — ABNORMAL HIGH (ref 70–99)
Glucose, Bld: 104 mg/dL — ABNORMAL HIGH (ref 70–99)
Potassium: 4.3 mmol/L (ref 3.5–5.1)
Potassium: 4.2 mmol/L (ref 3.5–5.1)
Sodium: 141 mmol/L (ref 135–145)
Sodium: 140 mmol/L (ref 135–145)

## 2019-03-19 LAB — CBC WITH DIFFERENTIAL/PLATELET
Abs Immature Granulocytes: 0.07 10*3/uL (ref 0.00–0.07)
Basophils Absolute: 0.1 10*3/uL (ref 0.0–0.1)
Basophils Relative: 0 %
Eosinophils Absolute: 0.2 10*3/uL (ref 0.0–0.5)
Eosinophils Relative: 2 %
HCT: 24.2 % — ABNORMAL LOW (ref 39.0–52.0)
Hemoglobin: 7.9 g/dL — ABNORMAL LOW (ref 13.0–17.0)
Immature Granulocytes: 1 %
Lymphocytes Relative: 15 %
Lymphs Abs: 1.7 10*3/uL (ref 0.7–4.0)
MCH: 34.2 pg — ABNORMAL HIGH (ref 26.0–34.0)
MCHC: 32.6 g/dL (ref 30.0–36.0)
MCV: 104.8 fL — ABNORMAL HIGH (ref 80.0–100.0)
Monocytes Absolute: 1.4 10*3/uL — ABNORMAL HIGH (ref 0.1–1.0)
Monocytes Relative: 12 %
Neutro Abs: 7.9 10*3/uL — ABNORMAL HIGH (ref 1.7–7.7)
Neutrophils Relative %: 70 %
Platelets: 315 10*3/uL (ref 150–400)
RBC: 2.31 MIL/uL — ABNORMAL LOW (ref 4.22–5.81)
RDW: 18 % — ABNORMAL HIGH (ref 11.5–15.5)
WBC: 11.3 10*3/uL — ABNORMAL HIGH (ref 4.0–10.5)
nRBC: 1.5 % — ABNORMAL HIGH (ref 0.0–0.2)

## 2019-03-19 LAB — MAGNESIUM: Magnesium: 1.4 mg/dL — ABNORMAL LOW (ref 1.7–2.4)

## 2019-03-19 LAB — PHOSPHORUS: Phosphorus: 2.3 mg/dL — ABNORMAL LOW (ref 2.5–4.6)

## 2019-03-19 SURGERY — COLONOSCOPY WITH PROPOFOL
Anesthesia: General

## 2019-03-19 MED ORDER — PEG 3350-KCL-NA BICARB-NACL 420 G PO SOLR: 4000.0000 mL | Freq: Once | ORAL | 0 refills | Status: DC

## 2019-03-19 MED ORDER — POTASSIUM PHOSPHATE MONOBASIC 500 MG PO TABS
1000.0000 mg | ORAL_TABLET | Freq: Once | ORAL | Status: AC
  Administered 2019-03-19: 1000 mg via ORAL
  Filled 2019-03-19: qty 2

## 2019-03-19 MED ORDER — ADULT MULTIVITAMIN W/MINERALS CH: 1.0000 | ORAL_TABLET | Freq: Every day | ORAL | 0 refills | Status: DC

## 2019-03-19 MED ORDER — MIDODRINE HCL 10 MG PO TABS: 10.0000 mg | ORAL_TABLET | Freq: Three times a day (TID) | ORAL | 0 refills | Status: DC

## 2019-03-19 MED ORDER — NICOTINE 21 MG/24HR TD PT24: 21.0000 mg | MEDICATED_PATCH | Freq: Every day | TRANSDERMAL | 0 refills | Status: DC

## 2019-03-19 MED ORDER — ENSURE ENLIVE PO LIQD: 237.0000 mL | Freq: Three times a day (TID) | ORAL | 12 refills | Status: DC

## 2019-03-19 MED ORDER — OMEPRAZOLE 40 MG PO CPDR: 40.0000 mg | DELAYED_RELEASE_CAPSULE | Freq: Two times a day (BID) | ORAL | 0 refills | Status: DC

## 2019-03-19 MED ORDER — SENNOSIDES-DOCUSATE SODIUM 8.6-50 MG PO TABS: 1.0000 | ORAL_TABLET | Freq: Every evening | ORAL | 0 refills | Status: DC | PRN

## 2019-03-19 MED ORDER — POTASSIUM CHLORIDE IN NACL 20-0.9 MEQ/L-% IV SOLN
INTRAVENOUS | Status: DC
  Filled 2019-03-19: qty 1000

## 2019-03-19 MED ORDER — MAGNESIUM SULFATE IN D5W 1-5 GM/100ML-% IV SOLN
1.0000 g | Freq: Once | INTRAVENOUS | Status: DC
  Filled 2019-03-19: qty 100

## 2019-03-19 MED ORDER — OXYCODONE-ACETAMINOPHEN 5-325 MG PO TABS: 1.0000 | ORAL_TABLET | Freq: Four times a day (QID) | ORAL | 0 refills | Status: DC | PRN

## 2019-03-19 MED ORDER — ASCORBIC ACID 500 MG PO TABS: 500.0000 mg | ORAL_TABLET | Freq: Two times a day (BID) | ORAL | 0 refills | Status: DC

## 2019-03-19 MED ORDER — MAGNESIUM SULFATE 4 GM/100ML IV SOLN
4.0000 g | Freq: Once | INTRAVENOUS | Status: AC
  Administered 2019-03-19: 4 g via INTRAVENOUS
  Filled 2019-03-19: qty 100

## 2019-03-19 NOTE — Consult Note (Addendum)
PHARMACY CONSULT NOTE - FOLLOW UP  Pharmacy Consult for Electrolyte Monitoring and Replacement   Recent Labs: Potassium (mmol/L)  Date Value  03/19/2019 4.3  01/16/2014 5.0   Magnesium (mg/dL)  Date Value  03/19/2019 1.4 (L)  01/15/2014 1.2 (L)   Calcium (mg/dL)  Date Value  03/19/2019 7.7 (L)   Calcium, Total (mg/dL)  Date Value  01/16/2014 8.5   Albumin (g/dL)  Date Value  03/14/2019 4.2  10/16/2015 4.7   Phosphorus (mg/dL)  Date Value  03/19/2019 2.3 (L)   Sodium (mmol/L)  Date Value  03/19/2019 141  08/11/2015 136 (A)  01/16/2014 139  Corrected Calcium: 8.66    Assessment: Pt presented with known history of alcohol abuse, and had a fall. K+ 4.3. Mg 1.4. Phosphorus 2.3. Ca is 7.7 with a albumin of 4.2. Pt is on continuous infusion of 0.9 NS w/ KCl 20 mEq infusion.   Renal function slowly improving: Scr 3.88>>3.47 >>3.34 >> 2.89  Given renal function is slowly improving, will remain conservative with replacing electrolytes.   Will continue to monitor if potassium infusion is needed.  Goal of Therapy:  Electrolytes WNL.   Plan:  Provider Stark Klein, Bing Neighbors, NP) ordered magnesium 4g x1 dose. Will not need to order additional magnesium replacement at this time.    Will order 1000 mg x 1 tablet of K-Phos.  Recheck levels with AM labs.   Rowland Lathe ,PharmD Clinical Pharmacist 03/19/2019 7:47 AM

## 2019-03-19 NOTE — Progress Notes (Signed)
Report called to [facility] and EMS notified of transport need. Sister [name] also notified. This RN will call [name] (sister) when patient is in route to facility.

## 2019-03-19 NOTE — Progress Notes (Signed)
Dr. Vicente Males called about patient refusing to drink his Go Lytely. Will continue to monitor.

## 2019-03-19 NOTE — Progress Notes (Signed)
Patient discharged to [facility] today, sister [name] notified, called back concerned after learning that the patient was deemed by [facility] to be of sound mind to make his own decisions. Discharging hospitalist notified of [name] (patient sister) concern.

## 2019-03-19 NOTE — Progress Notes (Signed)
[facility], [place] 03/19/19  Subjective:  Renal function does appear to be improving. Creatinine down to 2.7. Still having some left shoulder pain.   Objective:  Vital signs in last 24 hours:  Temp:  [98 F (36.7 C)-98.8 F (37.1 C)] 98.8 F (37.1 C) (06/09 1231) Pulse Rate:  [79-98] 79 (06/09 1231) Resp:  [16-20] 16 (06/09 1231) BP: (124-134)/(75-82) 124/75 (06/09 1231) SpO2:  [96 %-100 %] 96 % (06/09 1231)  Weight change:  Filed Weights   03/16/19 0421 03/17/19 0250 03/18/19 0830  Weight: 61.4 kg 69 kg 68 kg    Intake/Output:    Intake/Output Summary (Last 24 hours) at 03/19/2019 1429 Last data filed at 03/19/2019 1237 Gross per 24 hour  Intake 4034.58 ml  Output 1200 ml  Net 2834.58 ml     Physical Exam: General:  No acute distress, laying in the bed  HEENT  anicteric, moist oral mucous membranes  Neck  supple  Pulm/lungs  clear to auscultation bilateral, normal effort  CVS/Heart  no rub or gallop  Abdomen:   Soft, nontender  Extremities:  1+ lower extremity edema  Neurologic:  Alert, able to follow simple commands  Skin:  Normal turgor    Basic Metabolic Panel:  Recent Labs  Lab 03/14/19 2017 03/15/19 0530 03/16/19 0425 03/17/19 0458 03/18/19 0515 03/19/19 0421 03/19/19 0854  NA  --  130* 133* 139 141 141 140  K  --  3.0* 4.2 4.2 3.8 4.3 4.2  CL  --  97* 109 113* 112* 114* 112*  CO2  --  17* 17* 19* 18* 20* 17*  GLUCOSE  --  148* 123* 136* 118* 109* 104*  BUN  --  90* 64* 47* 35* 25* 23  CREATININE  --  4.89* 3.88* 3.47* 3.34* 2.89* 2.78*  CALCIUM  --  7.9* 7.9* 8.0* 7.8* 7.7* 8.0*  MG 1.6* 1.7 1.9  --  1.2* 1.4*  --   PHOS  --   --  3.1  --  1.8* 2.3*  --      CBC: Recent Labs  Lab 03/14/19 1506  03/15/19 0530 03/15/19 1913 03/16/19 0425 03/17/19 0458 03/18/19 0515 03/19/19 0421  WBC 18.7*  --  14.9*  --  13.7* 13.4* 13.4* 11.3*  NEUTROABS 15.4*  --   --   --  10.3* 9.4* 9.5* 7.9*  HGB 9.5*   < > 7.1*  8.0* 7.8* 7.4* 7.9* 7.9*  HCT 26.7*   < > 20.6* 24.3* 23.1* 22.6* 24.7* 24.2*  MCV 96.4  --  98.6  --  101.8* 105.1* 107.9* 104.8*  PLT 303  --  232  --  215 244 299 315   < > = values in this interval not displayed.     No results found for: HEPBSAG, HEPBSAB, HEPBIGM    Microbiology:  Recent Results (from the past 240 hour(s))  SARS Coronavirus 2 (CEPHEID - Performed in [place] hospital lab), Hosp Order     Status: None   Collection Time: 03/14/19  6:11 PM  Result Value Ref Range Status   SARS Coronavirus 2 NEGATIVE NEGATIVE Final    Comment: (NOTE) If result is NEGATIVE SARS-CoV-2 target nucleic acids are NOT DETECTED. The SARS-CoV-2 RNA is generally detectable in upper and lower  respiratory specimens during the acute phase of infection. The lowest  concentration of SARS-CoV-2 viral copies this assay can detect is 250  copies / mL. A negative result does not preclude SARS-CoV-2 infection  and  should not be used as the sole basis for treatment or other  patient management decisions.  A negative result may occur with  improper specimen collection / handling, submission of specimen other  than nasopharyngeal swab, presence of viral mutation(s) within the  areas targeted by this assay, and inadequate number of viral copies  (<250 copies / mL). A negative result must be combined with clinical  observations, patient history, and epidemiological information. If result is POSITIVE SARS-CoV-2 target nucleic acids are DETECTED. The SARS-CoV-2 RNA is generally detectable in upper and lower  respiratory specimens dur ing the acute phase of infection.  Positive  results are indicative of active infection with SARS-CoV-2.  Clinical  correlation with patient history and other diagnostic information is  necessary to determine patient infection status.  Positive results do  not rule out bacterial infection or co-infection with other viruses. If result is PRESUMPTIVE POSTIVE SARS-CoV-2  nucleic acids MAY BE PRESENT.   A presumptive positive result was obtained on the submitted specimen  and confirmed on repeat testing.  While 2019 novel coronavirus  (SARS-CoV-2) nucleic acids may be present in the submitted sample  additional confirmatory testing may be necessary for epidemiological  and / or clinical management purposes  to differentiate between  SARS-CoV-2 and other Sarbecovirus currently known to infect humans.  If clinically indicated additional testing with an alternate test  methodology [phone]) is advised. The SARS-CoV-2 RNA is generally  detectable in upper and lower respiratory sp ecimens during the acute  phase of infection. The expected result is Negative. Fact Sheet for Patients:  StrictlyIdeas.no Fact Sheet for Healthcare Providers: BankingDealers.co.za This test is not yet approved or cleared by the [place] FDA and has been authorized for detection and/or diagnosis of SARS-CoV-2 by FDA under an Emergency Use Authorization (EUA).  This EUA will remain in effect (meaning this test can be used) for the duration of the COVID-19 declaration under Section 564(b)(1) of the Act, 21 U.S.C. section 360bbb-3(b)(1), unless the authorization is terminated or revoked sooner. Performed at [facility], [address] [place], [place] [address]     Coagulation Studies: No results for input(s): LABPROT, INR in the last 72 hours.  Urinalysis: Recent Labs    03/17/19 1213  COLORURINE STRAW*  LABSPEC 1.008  PHURINE 5.0  GLUCOSEU NEGATIVE  HGBUR SMALL*  BILIRUBINUR NEGATIVE  KETONESUR NEGATIVE  PROTEINUR NEGATIVE  NITRITE NEGATIVE  LEUKOCYTESUR NEGATIVE      Imaging: No results found.   Medications:   . sodium chloride 0 mL/hr at 03/15/19 1857  . sodium chloride    . 0.9 % NaCl with KCl 20 mEq / L 100 mL/hr at 03/19/19 0955  . thiamine injection 250 mg (03/19/19 1236)   . allopurinol   100 mg Oral Daily  . aspirin  81 mg Oral Daily  . escitalopram  10 mg Oral Daily  . feeding supplement (ENSURE ENLIVE)  237 mL Oral TID BM  . folic acid  1 mg Oral Daily  . midodrine  10 mg Oral TID WC  . multivitamin with minerals  1 tablet Oral Daily  . nicotine  21 mg Transdermal Daily  . pantoprazole (PROTONIX) IV  40 mg Intravenous Q12H  . polyethylene glycol-electrolytes  4,000 mL Oral Once  . rosuvastatin  20 mg Oral Daily  . [START ON 03/23/2019] thiamine  100 mg Oral Daily  . traZODone  50 mg Oral Daily  . vitamin C  500 mg Oral BID   sodium  chloride, acetaminophen **OR** acetaminophen, HYDROmorphone (DILAUDID) injection, ondansetron **OR** ondansetron (ZOFRAN) IV, oxyCODONE-acetaminophen, senna-docusate  Assessment/ Plan:  68 y.o. male with alcohol abuse, atrophic left kidney, history of BPH, diabetes, gout, hypertension, history of kidney stones, coronary artery disease and atherosclerosis noted on CT abdomen,, was admitted on 03/14/2019 after he was found down from a fall.   1.  Acute kidney injury Kidney function continues to improve.  Creatinine down to 2.7.  Continue IV fluid hydration for now.  2.  Chronic kidney disease stage III.  With atrophic left kidney and history of multiple kidney stones and history of lithotripsy Baseline creatinine 1.5/GFR 47 Hematuria and proteinuria noted on previous urinalysis Consider ACE inhibitor and ARB post hospitalization.   3.  Diabetes type 2 with chronic kidney disease Lab Results  Component Value Date   HGBA1C 7.0 (H) 03/16/2019  Hemoglobin A1c currently at target at 7.0.  Glycemic control as per hospitalist.  4.  History of alcohol abuse Withdrawal protocol and vitamin supplementation as per primary team     LOS: 5 Addalee Kavanagh 6/9/20202:29 PM  [place], [place]  Note: This note was prepared with Dragon dictation. Any transcription errors are unintentional

## 2019-03-19 NOTE — NC FL2 (Signed)
[place] LEVEL OF CARE SCREENING TOOL     IDENTIFICATION  Patient Name: Edward Singleton Birthdate: [birth year] Sex: male Admission Date (Current Location): 03/14/2019  [place] and [place] Number:  Engineering geologist and Address:  [facility], [address] [place], [place] [address]      Provider Number: [id number]  Attending Physician Name and Address:  Sela Hua, MD  Relative Name and Phone Number:  Chauncy Passy Sister   [phone]     Current Level of Care: Hospital Recommended Level of Care: [place] Prior Approval Number:    Date Approved/Denied:   PASRR Number: [id number]  Discharge Plan: SNF    Current Diagnoses: Patient Active Problem List   Diagnosis Date Noted  . Delayed emergence from anesthesia 03/18/2019  . Hypokalemia 03/14/2019  . Alcoholic ketoacidosis 09/38/1829  . Alcohol withdrawal syndrome with complication ([place]) 93/71/6967  . Diabetes ([place]) 02/21/2019  . Left rotator cuff tear arthropathy 12/28/2015  . Hyperlipidemia 12/24/2015  . Tobacco abuse 11/06/2015  . Mood disturbance 11/06/2015  . CAD (coronary artery disease) 10/16/2015  . PAD (peripheral artery disease) ([place]) 10/16/2015  . Preventative health care 08/13/2015  . Renal artery stenosis ([place]) 07/30/2015  . Acute on chronic renal failure ([place]) 07/30/2015  . Kidney atrophy 07/30/2015  . CKD (chronic kidney disease), stage III ([place]) 07/30/2015  . Insomnia 07/22/2015  . History of substance abuse ([place]) 07/15/2015  . BPH (benign prostatic hyperplasia) 07/15/2015  . Gout 07/15/2015  . Kidney stones 05/11/2015  . 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 HTN (hypertension) 11/17/2014  . Alcohol abuse 11/17/2014    Orientation RESPIRATION BLADDER Height & Weight     Situation, Place, Self, Time  Normal Continent Weight: 150 lb (68 kg) Height:  5\' 7"  (170.2 cm)  BEHAVIORAL SYMPTOMS/MOOD NEUROLOGICAL BOWEL NUTRITION  STATUS      Continent Diet(Regular diet)  AMBULATORY STATUS COMMUNICATION OF NEEDS Skin   Limited Assist Verbally Skin abrasions                       Personal Care Assistance Level of Assistance  Dressing, Feeding, Bathing Bathing Assistance: Limited assistance Feeding assistance: Independent Dressing Assistance: Limited assistance     Functional Limitations Info  Sight, Hearing, Speech Sight Info: Adequate Hearing Info: Adequate Speech Info: Adequate    SPECIAL CARE FACTORS FREQUENCY  PT (By licensed PT), OT (By licensed OT)     PT Frequency: Minimum 5x a week OT Frequency: Minimum 5x a week            Contractures Contractures Info: Not present    Additional Factors Info  Psychotropic, Code Status, Allergies Code Status Info: Full Code Allergies Info: NKA Psychotropic Info: escitalopram (LEXAPRO) tablet 10 mg traZODone (DESYREL) tablet 50 mg          Current Medications (03/19/2019):  This is the current hospital active medication list Current Facility-Administered Medications  Medication Dose Route Frequency Provider Last Rate Last Dose  . 0.9 %  sodium chloride infusion   Intravenous PRN Dustin Flock, MD 0 mL/hr at 03/15/19 1857    . 0.9 %  sodium chloride infusion   Intravenous Continuous Jonathon Bellows, MD      . 0.9 % NaCl with KCl 20 mEq/ L  infusion   Intravenous Continuous Rowland Lathe, RPH 100 mL/hr at 03/19/19 0955    . acetaminophen (TYLENOL) tablet 650 mg  650 mg Oral Q6H PRN Pyreddy,  Pavan, MD   650 mg at 03/14/19 2132   Or  . acetaminophen (TYLENOL) suppository 650 mg  650 mg Rectal Q6H PRN Saundra Shelling, MD      . allopurinol (ZYLOPRIM) tablet 100 mg  100 mg Oral Daily Pyreddy, Pavan, MD   100 mg at 03/19/19 1004  . aspirin chewable tablet 81 mg  81 mg Oral Daily Pyreddy, Reatha Harps, MD   81 mg at 03/19/19 1002  . escitalopram (LEXAPRO) tablet 10 mg  10 mg Oral Daily Pyreddy, Reatha Harps, MD   10 mg at 03/18/19 1027  . feeding supplement (ENSURE  ENLIVE) (ENSURE ENLIVE) liquid 237 mL  237 mL Oral TID BM Dustin Flock, MD   237 mL at 03/17/19 1400  . folic acid (FOLVITE) tablet 1 mg  1 mg Oral Daily Pyreddy, Pavan, MD   1 mg at 03/19/19 1002  . HYDROmorphone (DILAUDID) injection 0.5 mg  0.5 mg Intravenous Q4H PRN Saundra Shelling, MD   0.5 mg at 03/18/19 1810  . midodrine (PROAMATINE) tablet 10 mg  10 mg Oral TID WC Dustin Flock, MD   10 mg at 03/19/19 1002  . multivitamin with minerals tablet 1 tablet  1 tablet Oral Daily Pyreddy, Reatha Harps, MD   1 tablet at 03/19/19 1002  . nicotine (NICODERM CQ - dosed in mg/24 hours) patch 21 mg  21 mg Transdermal Daily Pyreddy, Pavan, MD   21 mg at 03/17/19 1017  . ondansetron (ZOFRAN) tablet 4 mg  4 mg Oral Q6H PRN Pyreddy, Reatha Harps, MD       Or  . ondansetron (ZOFRAN) injection 4 mg  4 mg Intravenous Q6H PRN Pyreddy, Reatha Harps, MD      . oxyCODONE-acetaminophen (PERCOCET/ROXICET) 5-325 MG per tablet 1 tablet  1 tablet Oral Q6H PRN Saundra Shelling, MD   1 tablet at 03/19/19 0304  . pantoprazole (PROTONIX) injection 40 mg  40 mg Intravenous Q12H Pyreddy, Reatha Harps, MD   40 mg at 03/19/19 1003  . polyethylene glycol-electrolytes (NuLYTELY/GoLYTELY) solution 4,000 mL  4,000 mL Oral Once Mayo, Pete Pelt, MD      . rosuvastatin (CRESTOR) tablet 20 mg  20 mg Oral Daily Pyreddy, Reatha Harps, MD   20 mg at 03/18/19 1819  . senna-docusate (Senokot-S) tablet 1 tablet  1 tablet Oral QHS PRN Saundra Shelling, MD   1 tablet at 03/16/19 2149  . thiamine (B-1) 250 mg in sodium chloride 0.9 % 50 mL IVPB  250 mg Intravenous Daily Dustin Flock, MD      . Derrill Memo ON 03/23/2019] thiamine (VITAMIN B-1) tablet 100 mg  100 mg Oral Daily Dustin Flock, MD      . traZODone (DESYREL) tablet 50 mg  50 mg Oral Daily Pyreddy, Reatha Harps, MD   50 mg at 03/19/19 1002  . vitamin C (ASCORBIC ACID) tablet 500 mg  500 mg Oral BID Dustin Flock, MD   500 mg at 03/19/19 1003     Discharge Medications: Please see discharge summary for a list of discharge  medications.  Relevant Imaging Results:  Relevant Lab Results:   Additional Information SSN [id number]  Ross Ludwig, LCSW

## 2019-03-19 NOTE — Progress Notes (Signed)
Patient Telemetry went off. I went in the room to put it back on. Patient refused to put it back on and stated " He will let the Dr decide if he need to wear it".

## 2019-03-19 NOTE — Progress Notes (Signed)
PT Cancellation Note  Patient Details Name: Edward Singleton MRN: [id number] DOB: [birth year]   Cancelled Treatment:    Reason Eval/Treat Not Completed: Other (comment). Treatment attempted x 2. First attempt pt in bathroom with RN. On 2nd attempt, pt resting in bed and states he doesn't want PT today because he was not able to sleep last night. Reports he has been ambulatory to bathroom with staff and request services tomorrow. Colonoscopy cancelled today per RN. Will re-attempt next available date.   Amjad Fikes 03/19/2019, 11:24 AM  Greggory Stallion, PT, DPT [phone]

## 2019-03-19 NOTE — Discharge Summary (Addendum)
Edward Singleton at [place] NAME: Edward Singleton    MR#:  [id number]  DATE OF BIRTH:  1951/07/11  DATE OF ADMISSION:  03/14/2019   ADMITTING PHYSICIAN: Saundra Shelling, MD  DATE OF DISCHARGE: 03/19/19  PRIMARY CARE PHYSICIAN: Windy Fast, MD   ADMISSION DIAGNOSIS:   ETOH abuse [F10.10] Acute upper GI bleed [K92.2] Multiple abrasions [T07.XXXA] Fall, initial encounter [id number]]  DISCHARGE DIAGNOSIS:   Active Problems:   Hypokalemia   Delayed emergence from anesthesia   SECONDARY DIAGNOSIS:   Past Medical History:  Diagnosis Date   Alcohol abuse    Atrophic kidney    Benign enlargement of prostate    Dementia ([place])    due to alcohol   Diabetes mellitus without complication ([place])    Gout    Gout    Hypercholesteremia    Hypertension    Kidney stones    Left flank pain, chronic    Pelvic fracture ([place]) 11/17/2014    HOSPITAL COURSE:  68 y.o. male past medical history of EtOH abuse, diabetes, hypertension, hyperlipidemia, gout, nephrolithiasis, CKD stage III, COPD, and memory loss presenting with frequent falls.  1. Severe anemia -likely multifactorial in the setting of AKI, bone marrow suppression and malnutrition from heavy alcohol use -* s/p EGD 03/19/19 showing duodenitis, Gastritis and LA grade D reflux esophagitis - GI recommends colonoscopy however patient has declined the procedure - Hemoglobin 7.9 today - Continue to monitor H&H - Follow up with PCP  2. 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  3. 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 Acute on chronic kidney disease -patient with history of atrophic left kidney on renal ultrasound - Renal function continues to improved with IVFs - Avoid nephrotoxins - Continue to monitor renal function on outpatient basis  5. Hypokalemia-improved with supplement, mag normal  6. EtOH Abuse/ Withdrawal  - Daily Thiamine, Folate, MVI and B12 supplements - Insomnia: Trazodone - SW consult for cessation resources - PT/OT evaluation for mobility - CIWA +/- Standing Protocol - Patient has not required intervention thus far  7. DM: His sugars have been well-controlled on current regimen  - Holding  metformin in the setting of AKI, may need to restart if kidney functions improve - ADA 2100 calorie diet  - FS BS qac and qhs   8.  Tobacco abuse - Smoking cessation counseling - Nicotine patch provided  9. Hypomagnesia - repleted with IV mag  - Follow up with PCP at discharge  10. Leukocytosis :Improved - UA negative - CXR negative for pneumonia  DISCHARGE CONDITIONS:   stable  CONSULTS OBTAINED:   Treatment Team:  Lin Landsman, MD Jonathon Bellows, MD  DRUG ALLERGIES:   No Known Allergies DISCHARGE MEDICATIONS:   Allergies as of 03/19/2019   No Known Allergies     Medication List    STOP taking these medications   amLODipine 5 MG tablet Commonly known as:  NORVASC   metFORMIN 500 MG tablet Commonly known as:  GLUCOPHAGE   metoprolol succinate 25 MG 24 hr tablet Commonly known as:  TOPROL-XL     TAKE these medications   allopurinol 100 MG tablet Commonly known as:  ZYLOPRIM Take 100 mg by mouth daily.   ascorbic acid 500 MG tablet Commonly known as:  VITAMIN C Take 1 tablet (500 mg total) by mouth 2 (two) times daily.   aspirin 81 MG chewable tablet Chew 81 mg by mouth daily.   Crestor 20 MG tablet Generic drug:  rosuvastatin Take 20 mg by mouth daily.   feeding supplement (ENSURE ENLIVE) Liqd Take 237  mLs by mouth 3 (three) times daily between meals.   folic acid 1 MG tablet Commonly known as:  FOLVITE Take 1 mg by mouth daily.   Lexapro 10 MG tablet Generic drug:  escitalopram Take 10 mg by mouth daily.   midodrine 10 MG tablet Commonly known as:  PROAMATINE Take 1 tablet (10 mg total) by mouth 3 (three) times daily with meals.   multivitamin with minerals Tabs tablet Take 1 tablet by mouth daily. Start taking on:  March 20, 2019   nicotine 21 mg/24hr patch Commonly known as:  NICODERM CQ - dosed in mg/24 hours Place 1 patch (21 mg total) onto the skin daily. Start taking on:  March 20, 2019   omeprazole 40 MG capsule Commonly known as:  PriLOSEC Take 1 capsule (40 mg total) by mouth 2 (two) times a day.   oxyCODONE-acetaminophen 5-325 MG tablet Commonly known as:  PERCOCET/ROXICET Take 1 tablet by mouth every 6 (six) hours as needed for moderate pain.   senna-docusate 8.6-50 MG tablet Commonly known as:  Senokot-S Take 1 tablet by mouth at bedtime as needed for mild constipation.   thiamine 100 MG tablet Commonly known as:  VITAMIN B-1 Take 100 mg by mouth daily.   traZODone 50 MG tablet Commonly known as:  DESYREL Take 50 mg by mouth daily.       DISCHARGE INSTRUCTIONS:    DIET:   Regular diet  ACTIVITY:   Activity as tolerated  OXYGEN:   Home Oxygen: No.  Oxygen Delivery: room air  DISCHARGE LOCATION:   nursing home   If you experience worsening of your admission symptoms, develop shortness of breath, life threatening emergency, suicidal or homicidal thoughts you must seek medical attention immediately by calling 911 or calling your MD immediately  if symptoms less severe.  You Must read complete instructions/literature along with all the possible adverse reactions/side effects for all the Medicines you take and that have been prescribed to you. Take any new Medicines after you have completely understood and accpet all the possible adverse  reactions/side effects.   Please note  You were cared for by a hospitalist during your hospital stay. If you have any questions about your discharge medications or the care you received while you were in the hospital after you are discharged, you can call the unit and asked to speak with the hospitalist on call if the hospitalist that took care of you is not available. Once you are discharged, your primary care physician will handle any further medical issues. Please note that NO REFILLS for any discharge medications will be authorized once you are discharged, as it is imperative that you return to your primary care physician (or establish a relationship with a primary care physician if you do not have one) for your aftercare needs so that they can reassess your need for medications and monitor your lab values.  On the day of Discharge:  VITAL SIGNS:   Blood pressure 124/75, pulse  79, temperature 98.8 F (37.1 C), temperature source Oral, resp. rate 16, height 5\' 7"  (1.702 m), weight 68 kg, SpO2 96 %.  PHYSICAL EXAMINATION:    GENERAL:  68 y.o.-year-old patient lying in the bed with no acute distress.  EYES: Pupils equal, round, reactive to light and accommodation. No scleral icterus. Extraocular muscles intact.  HEENT: Head atraumatic, normocephalic. Oropharynx and nasopharynx clear.  NECK:  Supple, no jugular venous distention. No thyroid enlargement, no tenderness.  LUNGS: Normal breath sounds bilaterally, no wheezing, rales,rhonchi or crepitation. No use of accessory muscles of respiration.  CARDIOVASCULAR: S1, S2 normal. No murmurs, rubs, or gallops.  ABDOMEN: Soft, non-tender, non-distended. Bowel sounds present. No organomegaly or mass.  EXTREMITIES: No pedal edema, cyanosis, or clubbing.  NEUROLOGIC: Cranial nerves II through XII are intact. Muscle strength 5/5 in all extremities. Sensation intact. Gait not checked.  PSYCHIATRIC: The patient is alert and oriented x 3.  SKIN: No  obvious rash, lesion, or ulcer.   DATA REVIEW:   CBC Recent Labs  Lab 03/19/19 0421  WBC 11.3*  HGB 7.9*  HCT 24.2*  PLT 315    Chemistries  Recent Labs  Lab 03/14/19 1506  03/19/19 0421 03/19/19 0854  NA 129*   < > 141 140  K 2.9*   < > 4.3 4.2  CL 88*   < > 114* 112*  CO2 16*   < > 20* 17*  GLUCOSE 121*   < > 109* 104*  BUN 94*   < > 25* 23  CREATININE 5.83*   < > 2.89* 2.78*  CALCIUM 9.1   < > 7.7* 8.0*  MG  --    < > 1.4*  --   AST 23  --   --   --   ALT 15  --   --   --   ALKPHOS 139*  --   --   --   BILITOT 1.0  --   --   --    < > = values in this interval not displayed.     Microbiology Results  Results for orders placed or performed during the hospital encounter of 03/14/19  SARS Coronavirus 2 (CEPHEID - Performed in [place] hospital lab), Hosp Order     Status: None   Collection Time: 03/14/19  6:11 PM  Result Value Ref Range Status   SARS Coronavirus 2 NEGATIVE NEGATIVE Final    Comment: (NOTE) If result is NEGATIVE SARS-CoV-2 target nucleic acids are NOT DETECTED. The SARS-CoV-2 RNA is generally detectable in upper and lower  respiratory specimens during the acute phase of infection. The lowest  concentration of SARS-CoV-2 viral copies this assay can detect is 250  copies / mL. A negative result does not preclude SARS-CoV-2 infection  and should not be used as the sole basis for treatment or other  patient management decisions.  A negative result may occur with  improper specimen collection / handling, submission of specimen other  than nasopharyngeal swab, presence of viral mutation(s) within the  areas targeted by this assay, and inadequate number of viral copies  (<250 copies / mL). A negative result must be combined with clinical  observations, patient history, and epidemiological information. If result is POSITIVE SARS-CoV-2 target nucleic acids are DETECTED. The SARS-CoV-2 RNA is generally detectable in upper and lower  respiratory  specimens dur ing the acute phase of infection.  Positive  results are indicative of active infection with SARS-CoV-2.  Clinical  correlation with patient history  and other diagnostic information is  necessary to determine patient infection status.  Positive results do  not rule out bacterial infection or co-infection with other viruses. If result is PRESUMPTIVE POSTIVE SARS-CoV-2 nucleic acids MAY BE PRESENT.   A presumptive positive result was obtained on the submitted specimen  and confirmed on repeat testing.  While 2019 novel coronavirus  (SARS-CoV-2) nucleic acids may be present in the submitted sample  additional confirmatory testing may be necessary for epidemiological  and / or clinical management purposes  to differentiate between  SARS-CoV-2 and other Sarbecovirus currently known to infect humans.  If clinically indicated additional testing with an alternate test  methodology [phone]) is advised. The SARS-CoV-2 RNA is generally  detectable in upper and lower respiratory sp ecimens during the acute  phase of infection. The expected result is Negative. Fact Sheet for Patients:  StrictlyIdeas.no Fact Sheet for Healthcare Providers: BankingDealers.co.za This test is not yet approved or cleared by the [place] FDA and has been authorized for detection and/or diagnosis of SARS-CoV-2 by FDA under an Emergency Use Authorization (EUA).  This EUA will remain in effect (meaning this test can be used) for the duration of the COVID-19 declaration under Section 564(b)(1) of the Act, 21 U.S.C. section 360bbb-3(b)(1), unless the authorization is terminated or revoked sooner. Performed at [facility], [address], [place], [place] [address]     RADIOLOGY:  No results found.   Management plans discussed with the patient, family and they are in agreement.  CODE STATUS:     Code Status Orders  (From admission,  onward)         Start     Ordered   03/14/19 1944  Full code  Continuous     03/14/19 1943        Code Status History    Date Active Date Inactive Code Status Order ID Comments User Context   02/21/2019 0321 02/23/2019 1936 Full Code [id number]  Lance Coon, MD Inpatient   02/21/2019 0045 02/21/2019 0320 Full Code [id number]  Gregor Hams, MD ED   07/23/2015 0231 07/27/2015 2114 Full Code [id number]  Fritzi Mandes, MD Inpatient   11/25/2014 1510 12/08/2014 1650 Full Code [id number]  Cathlyn Parsons, PA-C Inpatient   11/25/2014 1510 11/25/2014 1510 Full Code [id number]  Cathlyn Parsons, PA-C Inpatient   11/17/2014 1518 11/25/2014 1510 Full Code [id number]  Lisette Abu, PA-C Inpatient    Advance Directive Documentation     Most Recent Value  Type of Advance Directive  Healthcare Power of Attorney, Living will  Pre-existing out of facility DNR order (yellow form or pink MOST form)  --  "MOST" Form in Place?  --      TOTAL TIME TAKING CARE OF THIS PATIENT: 38 minutes.   This patient was staffed with Dr. Valetta Fuller, [facility] who personally evaluated patient, reviewed documentation and agreed with discharge plan of care as above.  Rufina Falco, DNP, FNP-BC Hospitalist Nurse Practitioner    03/19/2019 at 2:10 PM  Between 7am to 6pm - Pager - [phone]  After 6pm go to [email or web address] - Technical brewer  Hospitalists  Office  [phone]  CC: Primary care physician; Windy Fast, MD   Note: This dictation was prepared with Dragon dictation along with smaller phrase technology. Any transcriptional errors that result from this process are unintentional.

## 2019-03-19 NOTE — Progress Notes (Signed)
[place] [facility] [place] NAME: Edward Singleton    MR#:  [id number]  DATE OF BIRTH:  [birth year]  SUBJECTIVE:   Chief Complaint  Patient presents with  . Fall  . Alcohol Problem   Patient seen at the bedside in no acute distress. He report that he is feeling fine. He wants to go "home/SNF and out of the hospital".  Patient declined bowel prep for colonoscopy as recommended by GI. Spoke with patient's sister who his POA and she is aware of this.   REVIEW OF SYSTEMS:  Review of Systems  Constitutional: Positive for weight loss. Negative for chills, fever and malaise/fatigue.  HENT: Negative for congestion, hearing loss and sore throat.   Eyes: Negative for blurred vision and double vision.  Respiratory: Negative for cough, shortness of breath and wheezing.   Cardiovascular: Negative for chest pain, palpitations, orthopnea and leg swelling.  Gastrointestinal: Negative for abdominal pain, diarrhea, nausea and vomiting.  Genitourinary: Negative for dysuria and urgency.  Musculoskeletal: Positive for falls and joint pain. Negative for myalgias.  Skin: Negative for rash.       Multiple bruising in upper and lower extremity  Neurological: Negative for dizziness, sensory change, speech change, focal weakness and headaches.  Endo/Heme/Allergies: Bruises/bleeds easily.  Psychiatric/Behavioral: Positive for memory loss and substance abuse. Negative for depression. The patient has insomnia.    DRUG ALLERGIES:  No Known Allergies VITALS:  Blood pressure 128/80, pulse 83, temperature 98.2 F (36.8 C), temperature source Oral, resp. rate 18, height 5\' 7"  (1.702 m), weight 68 kg, SpO2 99 %. PHYSICAL EXAMINATION:   GENERAL:  68 y.o.-year-old patient lying in the bed with no acute distress.  EYES: Pupils equal, round, reactive to light and accommodation. No scleral icterus. Extraocular muscles intact.  HEENT: Head atraumatic, normocephalic. Oropharynx and  nasopharynx clear.  NECK:  Supple, no jugular venous distention. No thyroid enlargement, no tenderness.  LUNGS: Normal breath sounds bilaterally, no wheezing, rales,rhonchi or crepitation. No use of accessory muscles of respiration.  CARDIOVASCULAR: S1, S2 normal. No murmurs, rubs, or gallops.  ABDOMEN: Soft, nontender, nondistended. Bowel sounds present. No organomegaly or mass.  EXTREMITIES: No pedal edema, cyanosis, or clubbing. No rash or lesions. + pedal pulses MUSCULOSKELETAL: Normal bulk, and power was 5+ grip and elbow, knee, and ankle flexion and extension bilaterally.  NEUROLOGIC:Alert and oriented x 3. CN 2-12 intact. Sensation to light touch and cold stimuli intact bilaterally. Finger to nose nl. Babinski is downgoing. DTR's (biceps, patellar, and achilles) 2+ and symmetric throughout. Gait not tested due to safety concern. PSYCHIATRIC: The patient is alert and oriented x 3.  SKIN: Multiple bruising in bilateral upper extremities  DATA REVIEWED:  LABORATORY PANEL:  Male CBC Recent Labs  Lab 03/19/19 0421  WBC 11.3*  HGB 7.9*  HCT 24.2*  PLT 315   ------------------------------------------------------------------------------------------------------------------ Chemistries  Recent Labs  Lab 03/14/19 1506  03/19/19 0421 03/19/19 0854  NA 129*   < > 141 140  K 2.9*   < > 4.3 4.2  CL 88*   < > 114* 112*  CO2 16*   < > 20* 17*  GLUCOSE 121*   < > 109* 104*  BUN 94*   < > 25* 23  CREATININE 5.83*   < > 2.89* 2.78*  CALCIUM 9.1   < > 7.7* 8.0*  MG  --    < > 1.4*  --   AST 23  --   --   --  ALT 15  --   --   --   ALKPHOS 139*  --   --   --   BILITOT 1.0  --   --   --    < > = values in this interval not displayed.   RADIOLOGY:  No results found. ASSESSMENT AND PLAN:   68 y.o. male past medical history of EtOH abuse, diabetes, hypertension, hyperlipidemia, gout, nephrolithiasis, CKD stage III, COPD, and memory loss presenting with frequent falls.  1. Severe  anemia -likely multifactorial in the setting of AKI, bone marrow suppression and malnutrition from heavy alcohol use -* s/p EGD today showing duodenitis, Gastritis and LA grade D reflux esophagitis - GI recommends colonoscopy tomorrow after patient has declined the procedure - Hemoglobin 7.9 today - Continue to monitor H&H - GI input appreciated  2. 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  3. 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 Acute on chronic kidney disease -patient with history of atrophic left kidney on renal ultrasound - Renal function continues to improved with IVFs - Avoid nephrotoxins - Continue to monitor renal function - Nephrology input appreciated  5. Hypokalemia-improved with supplement, mag normal  6. EtOH Abuse/ Withdrawal  - Daily Thiamine, Folate, MVI and B12 supplements - Insomnia: Trazodone - SW consult for cessation resources - PT/OT evaluation for mobility - CIWA +/- Standing Protocol - Patient has not required intervention thus far  7. DM: His sugars have been well-controlled on current regimen  - Holding  metformin in house - Low intensity sliding scale insulin coverage - ADA 2100 calorie diet  - FS BS qac and qhs   8.  Tobacco abuse - Smoking cessation counseling - Nicotine patch  9. DVT prophylaxis - Hold anti-coagulation for anemia  10 Hypomagnesia - replete with IV mag  - Recheck labs in am  11. Leukocytosis :Improving - Monitor for  fever  - UA negative - CXR negative for pneumonia   Spoke with Chauncy Passy, patient's sister and updated her on plan of care  today 03/19/19 pending placement to SNF  All the records are reviewed and case discussed with Care Management/Social Worker. Management plans discussed with the patient, family and they are in agreement.  CODE STATUS: Full Code  TOTAL TIME TAKING CARE OF THIS PATIENT: 38 minutes.   More than 50% of the time was spent in counseling/coordination of care: YES  POSSIBLE D/C IN 1 DAYS, DEPENDING ON CLINICAL CONDITION.   on 03/19/2019 at 11:08 AM  This patient was staffed with Dr. Valetta Fuller, [facility] who personally evaluated patient, reviewed documentation and agreed with assessment and plan of care as above.  Rufina Falco, DNP, FNP-BC [facility] Nurse Practitioner   Between 7am to 6pm - Pager [phone]  After 6pm go to [email or web address] - Technical brewer [place] [facility]  Office  [phone]  CC: Primary care physician; Windy Fast, MD  Note: This dictation was prepared with Dragon dictation along with smaller phrase technology. Any transcriptional errors that result from this process are unintentional.

## 2019-03-19 NOTE — Progress Notes (Signed)
Went in to do wound care on patient left arm. Whenever I unwrapped patient arm he refused to let me do anything else to it and wouldn't let me re wrap it. I explained to him that he could get and infection and he stated "If I do I will take the balme". Will continue to monitor.

## 2019-03-19 NOTE — Plan of Care (Signed)
  Problem: Education: Goal: Knowledge of General Education information will improve Description Including pain rating scale, medication(s)/side effects and non-pharmacologic comfort measures Outcome: Progressing   Problem: Activity: Goal: Risk for activity intolerance will decrease Outcome: Progressing   Problem: Health Behavior/Discharge Planning: Goal: Ability to manage health-related needs will improve Outcome: Adequate for Discharge   Problem: Clinical Measurements: Goal: Ability to maintain clinical measurements within normal limits will improve Outcome: Adequate for Discharge Goal: Respiratory complications will improve Outcome: Adequate for Discharge Goal: Cardiovascular complication will be avoided Outcome: Adequate for Discharge

## 2019-03-19 NOTE — TOC Transition Note (Signed)
Transition of Care [facility]) - CM/SW Discharge Note   Patient Details  Name: Edward Singleton MRN: [id number] Date of Birth: Mar 31, 1951  Transition of Care [facility]) CM/SW Contact:  Ross Ludwig, LCSW Phone Number: 03/19/2019, 5:35 PM   Clinical Narrative:     CSW updated APS worker [name] [phone] or [phone] that patient is discharging to [facility] room 701.  CSW faxed discharge summary, FL2, and PT notes to [phone].  Patient to be d/c'ed today to [facility] room 701.  Patient and family agreeable to plans will transport via ems RN to call report.to [facility] [phone]. Bedside nurse informed patient's sister that he is discharged.     Final next level of care: Skilled Nursing Facility Barriers to Discharge: Barriers Resolved   Patient Goals and CMS Choice Patient states their goals for this hospitalization and ongoing recovery are:: Patient states he wants to go to SNF for short term rehab, and then hopefully transition to an ALF. CMS Medicare.gov Compare Post Acute Care list provided to:: Patient Choice offered to / list presented to : Patient, [facility] POA / Guardian  Discharge Placement   Existing PASRR number confirmed : 03/18/19          Patient chooses bed at: [facility] [place] Patient to be transferred to facility by: [facility] Name of family member notified: Patient's sister [name] Patient and family notified of of transfer: 03/19/19  Discharge Plan and Services In-house Referral: NA   Post Acute Care Choice: Rule          DME Arranged: N/A DME Agency: NA       HH Arranged: NA HH Agency: NA        Social Determinants of Health ([place]) Interventions     Readmission Risk Interventions Readmission Risk Prevention Plan 03/15/2019  Transportation Screening Complete  PCP or Specialist Appt within 3-5 Days Complete  HRI or Home Care Consult Complete  Social Work Consult for  [place] Planning/Counseling Complete  Palliative Care Screening Complete  Medication Review Press photographer) Complete  Some recent data might be hidden

## 2019-03-19 NOTE — Progress Notes (Signed)
Patient transported to [facility] (sister notified).

## 2019-03-20 ENCOUNTER — Encounter: Payer: Self-pay | Admitting: Gastroenterology

## 2019-03-20 LAB — SURGICAL PATHOLOGY

## 2019-04-08 ENCOUNTER — Ambulatory Visit: Payer: Medicare Other | Admitting: Gastroenterology

## 2019-04-30 ENCOUNTER — Ambulatory Visit
Admission: EM | Admit: 2019-04-30 | Discharge: 2019-04-30 | Disposition: A | Discharge status: Home / Self Care | Payer: Medicare Other | Attending: Family Medicine | Admitting: Family Medicine

## 2019-04-30 ENCOUNTER — Other Ambulatory Visit: Payer: Self-pay

## 2019-04-30 ENCOUNTER — Encounter: Payer: Self-pay | Admitting: Emergency Medicine

## 2019-04-30 DIAGNOSIS — L989 Disorder of the skin and subcutaneous tissue, unspecified: Secondary | ICD-10-CM

## 2019-04-30 MED ORDER — DOXYCYCLINE HYCLATE 100 MG PO CAPS: 100.0000 mg | ORAL_CAPSULE | Freq: Two times a day (BID) | ORAL | 0 refills | Status: DC

## 2019-04-30 NOTE — ED Provider Notes (Signed)
[facility]    CSN: [id number] Arrival date & time: 04/30/19  0940      History   Chief Complaint Chief Complaint  Patient presents with  . Skin Problem   HPI  68 year old male with an extensive past medical history who is a former patient of mine presents for evaluation of a skin lesion.  Patient has a raised skin lesion on his left forearm.  He has noticed this for the past 2 weeks.  It is on the volar aspect.  He states that it is raised and red.  Slightly tender to palpation.  He states that he is got some pus out of it previously.  No medications tried.  No reports of fever.  No reports of fall, trauma, injury.  No reports of a bite of any kind.  No other associated symptoms.  No other complaints.  PMH, Surgical Hx, Family Hx, Social History reviewed and updated as below.  Past Medical History:  Diagnosis Date  . Alcohol abuse   . Atrophic kidney   . Benign enlargement of prostate   . 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 Diabetes mellitus without complication ([place])   . Gout   . Gout   . Hypercholesteremia   . Hypertension   . Kidney stones   . Left flank pain, chronic   . Pelvic fracture ([place]) 11/17/2014    Patient Active Problem List   Diagnosis Date Noted  . Delayed emergence from anesthesia 03/18/2019  . Hypokalemia 03/14/2019  . Alcoholic ketoacidosis 70/17/7939  . Alcohol withdrawal syndrome with complication ([place]) 03/00/9233  . Diabetes ([place]) 02/21/2019  . Left rotator cuff tear arthropathy 12/28/2015  . Hyperlipidemia 12/24/2015  . Tobacco abuse 11/06/2015  . Mood disturbance 11/06/2015  . CAD (coronary artery disease) 10/16/2015  . PAD (peripheral artery disease) ([place]) 10/16/2015  . Preventative health care 08/13/2015  . Renal artery stenosis ([place]) 07/30/2015  . Acute on chronic renal failure ([place]) 07/30/2015  . Kidney atrophy 07/30/2015  . CKD (chronic kidney disease), stage III ([place]) 07/30/2015  . Insomnia 07/22/2015  . History of  substance abuse ([place]) 07/15/2015  . BPH (benign prostatic hyperplasia) 07/15/2015  . Gout 07/15/2015  . Kidney stones 05/11/2015  . 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 HTN (hypertension) 11/17/2014  . Alcohol abuse 11/17/2014    Past Surgical History:  Procedure Laterality Date  . ESOPHAGOGASTRODUODENOSCOPY Left 03/18/2019   Procedure: ESOPHAGOGASTRODUODENOSCOPY (EGD);  Surgeon: Jonathon Bellows, MD;  Location: [facility] ENDOSCOPY;  Service: Gastroenterology;  Laterality: Left;  . left renal stent placement  2013  . LITHOTRIPSY    . ORIF HUMERUS FRACTURE Left 11/18/2014   Procedure: OPEN REDUCTION INTERNAL FIXATION (ORIF) DISTAL HUMERUS FRACTURE;  Surgeon: Rozanna Box, MD;  Location: [facility];  Service: Orthopedics;  Laterality: Left;  . SPLENECTOMY         Home Medications    Prior to Admission medications   Medication Sig Start Date End Date Taking? Authorizing Provider  allopurinol (ZYLOPRIM) 100 MG tablet Take 100 mg by mouth daily.   Yes [provider]  aspirin 81 MG chewable tablet Chew 81 mg by mouth daily.   Yes [provider]  escitalopram (LEXAPRO) 10 MG tablet Take 10 mg by mouth daily.   Yes [provider]  omeprazole (PRILOSEC) 40 MG capsule Take 1 capsule (40 mg total) by mouth 2 (two) times a day. 03/19/19  Yes Lang Snow, NP  rosuvastatin ([place])  20 MG tablet Take 20 mg by mouth daily.   Yes [provider]  thiamine (VITAMIN B-1) 100 MG tablet Take 100 mg by mouth daily.   Yes [provider]  traZODone (DESYREL) 50 MG tablet Take 50 mg by mouth daily.   Yes [provider]  vitamin C (VITAMIN C) 500 MG tablet Take 1 tablet (500 mg total) by mouth 2 (two) times daily. 03/19/19  Yes Lang Snow, NP  doxycycline (VIBRAMYCIN) 100 MG capsule Take 1 capsule (100 mg total) by mouth 2 (two) times daily. 04/30/19   Coral Spikes, DO  folic acid (FOLVITE) 1 MG tablet Take 1 mg by mouth daily.     [provider]    Family History Family History  Problem Relation Age of Onset  . Heart disease Mother   . Heart disease Father   . Diabetes Father   . Nephrolithiasis Paternal Grandfather   . Kidney disease Neg Hx   . Prostate cancer Neg Hx     Social History Social History   Tobacco Use  . Smoking status: Current Every Day Smoker    Packs/day: 1.00    Years: 50.00    Pack years: 50.00    Types: Cigarettes  . Smokeless tobacco: Former Systems developer    Types: Chew  Substance Use Topics  . Alcohol use: Not Currently    Alcohol/week: 42.0 standard drinks    Types: 42 Cans of beer per week    Comment: quit june 4th  . Drug use: No     Allergies   Patient has no known allergies.   Review of Systems Review of Systems  Constitutional: Negative.   Skin:       Skin lesion.   Physical Exam Triage Vital Signs ED Triage Vitals  Enc Vitals Group     BP 04/30/19 0958 (!) 148/86     Pulse Rate 04/30/19 0958 72     Resp 04/30/19 0958 18     Temp 04/30/19 0958 98.2 F (36.8 C)     Temp Source 04/30/19 0958 Oral     SpO2 04/30/19 0958 100 %     Weight 04/30/19 0954 155 lb (70.3 kg)     Height 04/30/19 0954 5\' 7"  (1.702 m)     Head Circumference --      [facility] --      Pain Score 04/30/19 0954 0     Pain Loc --      Pain Edu? --      Excl. in [place]? --    Updated Vital Signs BP (!) 148/86 (BP Location: Right Arm)   Pulse 72   Temp 98.2 F (36.8 C) (Oral)   Resp 18   Ht 5\' 7"  (1.702 m)   Wt 70.3 kg   SpO2 100%   BMI 24.28 kg/m   Visual Acuity Right Eye Distance:   Left Eye Distance:   Bilateral Distance:    Right Eye Near:   Left Eye Near:    Bilateral Near:     Physical Exam Vitals signs and nursing note reviewed.  Constitutional:      General: He is not in acute distress.    Appearance: Normal appearance.  HENT:     Head: Normocephalic and atraumatic.  Eyes:     General:        Right eye: No discharge.        Left eye: No discharge.      Conjunctiva/sclera: Conjunctivae normal.  Cardiovascular:     Rate and Rhythm: Normal rate and regular rhythm.  Pulmonary:     Effort: Pulmonary effort is normal. No respiratory distress.  Skin:         Comments: Nickel sized raised skin lesion noted at the labelled location.  Mildly erythematous.  Vasculature visible.  Neurological:     Mental Status: He is alert.  Psychiatric:        Mood and Affect: Mood normal.        Behavior: Behavior normal.    UC Treatments / Results  Labs (all labs ordered are listed, but only abnormal results are displayed) Labs Reviewed - No data to display  EKG   Radiology No results found.  Procedures Procedures (including critical care time)  Medications Ordered in UC Medications - No data to display  Initial Impression / Assessment and Plan / UC Course  I have reviewed the triage vital signs and the nursing notes.  Pertinent labs & imaging results that were available during my care of the patient were reviewed by me and considered in my medical decision making (see chart for details).    68 year old male presents with skin lesion.  Covering for infection with doxycycline.  Needs excision.  Sending to dermatology.   Final Clinical Impressions(s) / UC Diagnoses   Final diagnoses:  Skin lesion     Discharge Instructions     Needs to be removed and sent to path.  Call [facility] - [phone] [place]; [phone] [facility]  Antibiotic as prescribed.  Take care  Dr. Lacinda Axon    ED Prescriptions    Medication Sig Dispense Auth. Provider   doxycycline (VIBRAMYCIN) 100 MG capsule Take 1 capsule (100 mg total) by mouth 2 (two) times daily. 14 capsule Coral Spikes, DO     Controlled Substance Prescriptions [place] Controlled Substance Registry consulted? Not Applicable   Coral Spikes, DO 04/30/19 1225

## 2019-04-30 NOTE — ED Triage Notes (Signed)
Pt c/o lesion on his left forearm. He noticed it about 2 weeks ago. Lesion is raised, and red. Mildly painful when touching.

## 2019-04-30 NOTE — Discharge Instructions (Signed)
Needs to be removed and sent to path.  Call [facility] - [phone] [place]; [phone] [facility]  Antibiotic as prescribed.  Take care  Dr. Lacinda Axon

## 2019-05-15 ENCOUNTER — Telehealth: Payer: Self-pay

## 2019-05-15 NOTE — Telephone Encounter (Signed)
Called pt sister, [name], to schedule pt repeat upper endoscopy procedure, as Dr. Vicente Males planned after pt last endoscopy.  Unable to contact, LVM to return call

## 2021-05-13 ENCOUNTER — Emergency Department
Admission: EM | Admit: 2021-05-13 | Discharge: 2021-05-13 | Disposition: A | Discharge status: Home / Self Care | Payer: No Typology Code available for payment source | Attending: Emergency Medicine | Admitting: Emergency Medicine

## 2021-05-13 ENCOUNTER — Other Ambulatory Visit: Payer: Self-pay

## 2021-05-13 DIAGNOSIS — Z20822 Contact with and (suspected) exposure to covid-19: Secondary | ICD-10-CM | POA: Diagnosis not present

## 2021-05-13 DIAGNOSIS — E785 Hyperlipidemia, unspecified: Secondary | ICD-10-CM | POA: Insufficient documentation

## 2021-05-13 DIAGNOSIS — Z046 Encounter for general psychiatric examination, requested by authority: Secondary | ICD-10-CM | POA: Insufficient documentation

## 2021-05-13 DIAGNOSIS — Z79899 Other long term (current) drug therapy: Secondary | ICD-10-CM | POA: Insufficient documentation

## 2021-05-13 DIAGNOSIS — Z7982 Long term (current) use of aspirin: Secondary | ICD-10-CM | POA: Insufficient documentation

## 2021-05-13 DIAGNOSIS — E1122 Type 2 diabetes mellitus with diabetic chronic kidney disease: Secondary | ICD-10-CM | POA: Diagnosis not present

## 2021-05-13 DIAGNOSIS — F1027 Alcohol dependence with alcohol-induced persisting dementia: Secondary | ICD-10-CM | POA: Diagnosis not present

## 2021-05-13 DIAGNOSIS — I2510 Atherosclerotic heart disease of native coronary artery without angina pectoris: Secondary | ICD-10-CM | POA: Diagnosis not present

## 2021-05-13 DIAGNOSIS — F1020 Alcohol dependence, uncomplicated: Secondary | ICD-10-CM | POA: Diagnosis not present

## 2021-05-13 DIAGNOSIS — Y906 Blood alcohol level of 120-199 mg/100 ml: Secondary | ICD-10-CM | POA: Diagnosis not present

## 2021-05-13 DIAGNOSIS — F1010 Alcohol abuse, uncomplicated: Secondary | ICD-10-CM

## 2021-05-13 DIAGNOSIS — I129 Hypertensive chronic kidney disease with stage 1 through stage 4 chronic kidney disease, or unspecified chronic kidney disease: Secondary | ICD-10-CM | POA: Insufficient documentation

## 2021-05-13 DIAGNOSIS — E1151 Type 2 diabetes mellitus with diabetic peripheral angiopathy without gangrene: Secondary | ICD-10-CM | POA: Diagnosis not present

## 2021-05-13 DIAGNOSIS — Z7984 Long term (current) use of oral hypoglycemic drugs: Secondary | ICD-10-CM | POA: Insufficient documentation

## 2021-05-13 DIAGNOSIS — E1169 Type 2 diabetes mellitus with other specified complication: Secondary | ICD-10-CM | POA: Insufficient documentation

## 2021-05-13 DIAGNOSIS — F17210 Nicotine dependence, cigarettes, uncomplicated: Secondary | ICD-10-CM | POA: Diagnosis not present

## 2021-05-13 DIAGNOSIS — F10920 Alcohol use, unspecified with intoxication, uncomplicated: Secondary | ICD-10-CM

## 2021-05-13 DIAGNOSIS — R410 Disorientation, unspecified: Secondary | ICD-10-CM | POA: Diagnosis present

## 2021-05-13 DIAGNOSIS — N183 Chronic kidney disease, stage 3 unspecified: Secondary | ICD-10-CM | POA: Diagnosis not present

## 2021-05-13 LAB — SALICYLATE LEVEL: Salicylate Lvl: <7 mg/dL — ABNORMAL LOW (ref 7.0–30.0)

## 2021-05-13 LAB — COMPREHENSIVE METABOLIC PANEL
ALT: 15 U/L (ref 0–44)
AST: 25 U/L (ref 15–41)
Albumin: 4.5 g/dL (ref 3.5–5.0)
Alkaline Phosphatase: 106 U/L (ref 38–126)
Anion gap: 19 — ABNORMAL HIGH (ref 5–15)
BUN: 27 mg/dL — ABNORMAL HIGH (ref 8–23)
CO2: 16 mmol/L — ABNORMAL LOW (ref 22–32)
Calcium: 9.1 mg/dL (ref 8.9–10.3)
Chloride: 103 mmol/L (ref 98–111)
Creatinine, Ser: 2.58 mg/dL — ABNORMAL HIGH (ref 0.61–1.24)
GFR, Estimated: 26 mL/min — ABNORMAL LOW (ref >60)
Glucose, Bld: 120 mg/dL — ABNORMAL HIGH (ref 70–99)
Potassium: 4.7 mmol/L (ref 3.5–5.1)
Sodium: 138 mmol/L (ref 135–145)
Total Bilirubin: 0.9 mg/dL (ref 0.3–1.2)
Total Protein: 7.8 g/dL (ref 6.5–8.1)

## 2021-05-13 LAB — ACETAMINOPHEN LEVEL: Acetaminophen (Tylenol), Serum: <10 ug/mL — ABNORMAL LOW (ref 10–30)

## 2021-05-13 LAB — RESP PANEL BY RT-PCR (FLU A&B, COVID) ARPGX2
Influenza A by PCR: NEGATIVE
Influenza B by PCR: NEGATIVE
SARS Coronavirus 2 by RT PCR: NEGATIVE

## 2021-05-13 LAB — ETHANOL: Alcohol, Ethyl (B): 191 mg/dL — ABNORMAL HIGH (ref <10)

## 2021-05-13 NOTE — BH Assessment (Signed)
Comprehensive Clinical Assessment (CCA) Screening, Triage and Referral Note  05/13/2021 Edward Singleton [id number]  Edward Singleton is a 70 year old male who presents to the ER, via law enforcement after he was found in the neighbor's yard, intoxicated. Patient states he doesn't know why he was brought to the ER but believes it was because of his sister. She doesn't like for him to drink. Patient reports of not having the ability to get the alcohol but friends bring it to him. He states he doesn't drink that often. Per his report, he has only drunk two to three times within the last thirty days, and none within the last seven days. Throughout the interview, he denied SI/HI and AV/H. He also denied the use of mind-altering substances, except alcohol.  Chief Complaint:  Chief Complaint  Patient presents with   ivc   Visit Diagnosis: Alcohol Use Disorder  Patient Reported Information How did you hear about [place]? Other (Comment)  What Is the Reason for Your Visit/Call Today? Patient unsure why he was brought to the ER. IVC states he was found in the neighbor's yard, intoxicated.  How Long Has This Been Causing You Problems? <Week  What Do You Feel Would Help You the Most Today? Alcohol or Drug Use Treatment   Have You Recently Had Any Thoughts About Hurting Yourself? No  Are You Planning to Commit Suicide/Harm Yourself At This time? No   Have you Recently Had Thoughts About [place]? No  Are You Planning to Harm Someone at This Time? No  Explanation: No data recorded  Have You Used Any Alcohol or Drugs in the Past 24 Hours? Yes  How Long Ago Did You Use Drugs or Alcohol? No data recorded What Did You Use and How Much? Alcohol, patient states the amount is unknown.   Do You Currently Have a Therapist/Psychiatrist? No  Name of Therapist/Psychiatrist: No data recorded  Have You Been Recently Discharged From Any Office Practice or Programs? No  Explanation of Discharge  From Practice/Program: No data recorded   CCA Screening Triage Referral Assessment Type of Contact: Face-to-Face  Telemedicine Service Delivery:   Is this Initial or Reassessment? No data recorded Date Telepsych consult ordered in [facility]:  No data recorded Time Telepsych consult ordered in [facility]:  No data recorded Location of Assessment: [facility] ED  Provider Location: [facility] ED   Collateral Involvement: No data recorded  Does Patient Have a Garey? No data recorded Name and Contact of Legal Guardian: No data recorded If Minor and Not Living with Parent(s), Who has Custody? No data recorded Is CPS involved or ever been involved? Never  Is APS involved or ever been involved? Never   Patient Determined To Be At Risk for Harm To Self or Others Based on Review of Patient Reported Information or Presenting Complaint? No  Method: No data recorded Availability of Means: No data recorded Intent: No data recorded Notification Required: No data recorded Additional Information for Danger to Others Potential: No data recorded Additional Comments for Danger to Others Potential: No data recorded Are There Guns or Other Weapons in Your Home? No data recorded Types of Guns/Weapons: No data recorded Are These Weapons Safely Secured?                            No data recorded Who Could Verify You Are Able To Have These Secured: No data recorded Do You Have any Outstanding Charges,  Pending Court Dates, Parole/Probation? No data recorded Contacted To Inform of Risk of Harm To Self or Others: No data recorded  Does Patient Present under Involuntary Commitment? Yes  IVC Papers Initial File Date: 05/13/21   [place] of Residence: No data recorded  Patient Currently Receiving the Following Services: Not Receiving Services   Determination of Need: Emergent (2 hours)   Options For Referral: ED Visit   Discharge Disposition:   Gunnar Fusi MS, LCAS, [facility],  [facility] Therapeutic Triage Specialist 05/13/2021 5:52 PM

## 2021-05-13 NOTE — Consult Note (Signed)
[facility] Consult   Reason for Consult: Consult for 70 year old man brought in under IVC filed by Event organiser saying that the patient had appeared to be confused and possibly intoxicated Referring Physician: Cinda Quest Patient Identification: Edward Singleton MRN:  [id number] Principal Diagnosis: Alcohol abuse Diagnosis:  Principal Problem:   Alcohol abuse   Total Time spent with patient: 1 hour  Subjective:   Edward Singleton is a 70 y.o. male patient admitted with "this is because my sister wants to control me".  HPI: Patient seen chart reviewed.  Patient was brought in under IVC filed by Event organiser.  The paperwork states that the patient had been seen in a neighbor's backyard and at the time was confused and disoriented.  Also it was reported that he seemed to have some injuries to his arms and appeared to be intoxicated and was positive to a field alcohol test.  Patient was awake and alert in the emergency room.  He states that he was at home when the police came by to pick him up.  He does not remember having been in a neighbor's yard.  He says that if he had been in anybody's yard he would think it would be his.  Patient believes that somebody told on him to his sister who called law enforcement.  Does not seem to be any clear evidence of this.  Patient admits that he consumed alcohol.  His memory is that he drank some Canadian mist whiskey last night that was brought over to his house by a friend.  He claims that he does not drink on a daily basis because he cannot get access to it.  Denies other drug use.  Denies mood symptoms.  Denies suicidal thoughts.  Denies hallucinations.  Patient states that he has felt fine recently feels like he is taking care of his health adequately  Past Psychiatric History: Past history of alcohol abuse.  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  Patient does not know of any  history of seizures or DTs.  Does not appear to have ever engaged in any substance abuse treatment.  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   Diabetes mellitus without complication ([place])    Gout    Gout    Hypercholesteremia    Hypertension    Kidney stones    Left flank pain, chronic    Pelvic fracture ([place]) 11/17/2014    Past Surgical History:  Procedure Laterality Date   ESOPHAGOGASTRODUODENOSCOPY Left 03/18/2019   Procedure: ESOPHAGOGASTRODUODENOSCOPY (EGD);  Surgeon: Jonathon Bellows, MD;  Location: [facility] ENDOSCOPY;  Service: Gastroenterology;  Laterality: Left;   left renal stent placement  2013   LITHOTRIPSY     ORIF HUMERUS FRACTURE Left 11/18/2014   Procedure: OPEN REDUCTION INTERNAL FIXATION (ORIF) DISTAL HUMERUS FRACTURE;  Surgeon: Rozanna Box, MD;  Location: [facility];  Service: Orthopedics;  Laterality: Left;   SPLENECTOMY     Family History:  Family History  Problem Relation Age of Onset   Heart disease Mother    Heart disease Father  Diabetes Father    Nephrolithiasis Paternal Grandfather    Kidney disease Neg Hx    Prostate cancer Neg Hx    Family Psychiatric  History: None reported Social History:  Social History   Substance and Sexual Activity  Alcohol Use Not Currently   Alcohol/week: 42.0 standard drinks   Types: 42 Cans of beer per week   Comment: quit june 4th     Social History   Substance and Sexual Activity  Drug Use No    Social History   Socioeconomic History   Marital status: Single    Spouse name: Not on file   Number of children: Not on file   Years of education: Not on file   Highest education level: Not on file  Occupational History   Not on file  Tobacco  Use   Smoking status: Every Day    Packs/day: 1.00    Years: 50.00    Pack years: 50.00    Types: Cigarettes   Smokeless tobacco: Former    Types: Nurse, children's Use: Never used  Substance and Sexual Activity   Alcohol use: Not Currently    Alcohol/week: 42.0 standard drinks    Types: 42 Cans of beer per week    Comment: quit june 4th   Drug use: No   Sexual activity: Not on file  Other Topics Concern   Not on file  Social History Narrative   Not on file   Social Determinants of Health   Financial Resource Strain: Not on file  Food Insecurity: Not on file  Transportation Needs: Not on file  Physical Activity: Not on file  Stress: Not on file  Social Connections: Not on file   Additional Social History:    Allergies:  No Known Allergies  Labs:  Results for orders placed or performed during the hospital encounter of 05/13/21 (from the past 48 hour(s))  Comprehensive metabolic panel     Status: Abnormal   Collection Time: 05/13/21  4:25 PM  Result Value Ref Range   Sodium 138 135 - 145 mmol/L   Potassium 4.7 3.5 - 5.1 mmol/L   Chloride 103 98 - 111 mmol/L   CO2 16 (L) 22 - 32 mmol/L   Glucose, Bld 120 (H) 70 - 99 mg/dL    Comment: Glucose reference range applies only to samples taken after fasting for at least 8 hours.   BUN 27 (H) 8 - 23 mg/dL   Creatinine, Ser 2.58 (H) 0.61 - 1.24 mg/dL   Calcium 9.1 8.9 - 10.3 mg/dL   Total Protein 7.8 6.5 - 8.1 g/dL   Albumin 4.5 3.5 - 5.0 g/dL   AST 25 15 - 41 U/L   ALT 15 0 - 44 U/L   Alkaline Phosphatase 106 38 - 126 U/L   Total Bilirubin 0.9 0.3 - 1.2 mg/dL   GFR, Estimated 26 (L) >60 mL/min    Comment: (NOTE) Calculated using the CKD-EPI Creatinine Equation (2021)    Anion gap 19 (H) 5 - 15    Comment: Performed at [facility], [address] [place], [place] [address]  Ethanol     Status: Abnormal   Collection Time: 05/13/21  4:25 PM  Result Value Ref Range   Alcohol, Ethyl (B) 191 (H) <10  mg/dL    Comment: (NOTE) Lowest detectable limit for serum alcohol is 10 mg/dL.  For medical purposes only. Performed at [facility], [address], [place], [place] [address]  Salicylate level     Status: Abnormal   Collection Time: 05/13/21  4:25 PM  Result Value Ref Range   Salicylate Lvl <4.0 (L) 7.0 - 30.0 mg/dL    Comment: Performed at [facility], [address] [place], [place] [address]  Acetaminophen level     Status: Abnormal   Collection Time: 05/13/21  4:25 PM  Result Value Ref Range   Acetaminophen (Tylenol), Serum <10 (L) 10 - 30 ug/mL    Comment: (NOTE) Therapeutic concentrations vary significantly. A range of 10-30 ug/mL  may be an effective concentration for many patients. However, some  are best treated at concentrations outside of this range. Acetaminophen concentrations >150 ug/mL at 4 hours after ingestion  and >50 ug/mL at 12 hours after ingestion are often associated with  toxic reactions.  Performed at [facility], [address] [place],  [address]     No current facility-administered medications for this encounter.   Current Outpatient Medications  Medication Sig Dispense Refill   allopurinol (ZYLOPRIM) 100 MG tablet Take 100 mg by mouth daily.     aspirin 81 MG chewable tablet Chew 81 mg by mouth daily.     doxycycline (VIBRAMYCIN) 100 MG capsule Take 1 capsule (100 mg total) by mouth 2 (two) times daily. 14 capsule 0   escitalopram (LEXAPRO) 10 MG tablet Take 10 mg by mouth daily.     folic acid (FOLVITE) 1 MG tablet Take 1 mg by mouth daily.     omeprazole (PRILOSEC) 40 MG capsule Take 1 capsule (40 mg total) by mouth 2 (two) times a day. 60 capsule 0   rosuvastatin (CRESTOR) 20 MG tablet Take 20 mg by mouth daily.     thiamine (VITAMIN B-1) 100 MG tablet Take 100 mg by mouth daily.     traZODone (DESYREL) 50 MG tablet Take 50 mg by mouth daily.     vitamin C (VITAMIN C) 500 MG tablet Take 1 tablet  (500 mg total) by mouth 2 (two) times daily. 30 tablet 0    Musculoskeletal: Strength & Muscle Tone: within normal limits Gait & Station: normal Patient leans: N/A            Psychiatric Specialty Exam:  Presentation  General Appearance:  No data recorded Eye Contact: No data recorded Speech: No data recorded Speech Volume: No data recorded Handedness: No data recorded  Mood and Affect  Mood: No data recorded Affect: No data recorded  Thought Process  Thought Processes: No data recorded Descriptions of Associations:No data recorded Orientation:No data recorded Thought Content:No data recorded History of Schizophrenia/Schizoaffective disorder:No data recorded Duration of Psychotic Symptoms:No data recorded Hallucinations:No data recorded Ideas of Reference:No data recorded Suicidal Thoughts:No data recorded Homicidal Thoughts:No data recorded  Sensorium  Memory: No data recorded Judgment: No data recorded Insight: No data recorded  Executive Functions  Concentration: No data recorded Attention Span: No data recorded Recall: No data recorded Fund of Knowledge: No data recorded Language: No data recorded  Psychomotor Activity  Psychomotor Activity: No data recorded  Assets  Assets: No data recorded  Sleep  Sleep: No data recorded  Physical Exam: Physical Exam Vitals and nursing note reviewed.  Constitutional:      Appearance: Normal appearance.  HENT:     Head: Normocephalic and atraumatic.     Mouth/Throat:     Pharynx: Oropharynx is clear.  Eyes:     Pupils: Pupils are equal, round, and reactive to light.  Cardiovascular:  Rate and Rhythm: Normal rate and regular rhythm.  Pulmonary:     Effort: Pulmonary effort is normal.     Breath sounds: Normal breath sounds.  Abdominal:     General: Abdomen is flat.     Palpations: Abdomen is soft.  Musculoskeletal:        General: Normal range of motion.  Skin:    General:  Skin is warm and dry.       Neurological:     General: No focal deficit present.     Mental Status: He is alert. Mental status is at baseline.  Psychiatric:        Attention and Perception: Attention normal.        Mood and Affect: Mood normal.        Speech: Speech normal.        Behavior: Behavior normal.        Thought Content: Thought content normal.        Cognition and Memory: Memory is impaired.        Judgment: Judgment normal.   Review of Systems  Constitutional: Negative.   HENT: Negative.    Eyes: Negative.   Respiratory: Negative.    Cardiovascular: Negative.   Gastrointestinal: Negative.   Musculoskeletal: Negative.   Skin: Negative.   Neurological: Negative.   Psychiatric/Behavioral:  Positive for memory loss and substance abuse. Negative for depression, hallucinations and suicidal ideas. The patient is not nervous/anxious and does not have insomnia.   Blood pressure (!) 151/80, pulse (!) 112, temperature 98 F (36.7 C), temperature source Axillary, resp. rate 20, height 5\' 7"  (1.702 m), weight 65.8 kg, SpO2 92 %. Body mass index is 22.71 kg/m.  Treatment Plan Summary: Plan 70 year old man who had a positive blood alcohol level on presentation here.  Patient admits he had been drinking alcohol sometime over the last day.  He claims that he is not drinking on a daily basis but somebody brought some over to his house.  He is currently calm and pleasant and cooperative.  Not exhibiting any mood symptoms and denies any mood distress.  Denies suicidal or homicidal thoughts.  Oriented to basic situation although his short term memory for what happened while he was drinking is poor.  Patient was encouraged to not drink on a daily basis and to continue taking care of his basic health.  Has no interest in substance abuse treatment.  Does not meet commitment criteria.  Discontinue IVC.  He can be discharged home at the discretion of the ER physician with whom the case was  reviewed.  Disposition: No evidence of imminent risk to self or others at present.   Patient does not meet criteria for psychiatric inpatient admission. Supportive therapy provided about ongoing stressors.  Alethia Berthold, MD 05/13/2021 5:29 PM

## 2021-05-13 NOTE — Discharge Instructions (Addendum)
Please be careful how much you drink.  You can get hurt seriously if you drink too much.  Follow-up with [facility] if you think you need help or alcoholics anonymous for your drinking.

## 2021-05-13 NOTE — ED Notes (Signed)
Sister, [name] wants information regarding patient. Pt refuses to speak with her but gives RN permission to release info to sister [name].

## 2021-05-13 NOTE — ED Notes (Signed)
Vicky, sister of patient will be here to pick up patient at 715PM.

## 2021-05-13 NOTE — ED Notes (Signed)
Pt dressed out with this RN, logan NT and BPD. Belongings include: [employer] Pants t shirt Underwear

## 2021-05-13 NOTE — ED Notes (Signed)
Edward Singleton, sister of patient voices concerns about pt alcohol use. States that she is not in town now and will call other sibling to see if they can facilitate a ride home for patient. Pt unable to go home at home on his own on this time due to alcohol intoxication.

## 2021-05-13 NOTE — ED Provider Notes (Signed)
[facility] Emergency Department Provider Note   ____________________________________________   Event Date/Time   First MD Initiated Contact with Patient 05/13/21 1608     (approximate)  I have reviewed the triage vital signs and the nursing notes.   HISTORY  Chief Complaint ivc    HPI BRUCE CHURILLA is a 70 y.o. male who was found in the neighbors yard.  He was confused and disoriented did not know how he got there where he was.  He comes in now he knows who he is and where he is.  He is willing to stay for a little bit.  He does have some bruises and skin tears on his arms.         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   Diabetes mellitus without complication ([place])    Gout    Gout    Hypercholesteremia    Hypertension    Kidney stones    Left flank pain, chronic    Pelvic fracture ([place]) 11/17/2014    Patient Active Problem List   Diagnosis Date Noted   Delayed emergence from anesthesia 03/18/2019   Hypokalemia 81/19/1478   Alcoholic ketoacidosis 29/56/2130   Alcohol withdrawal syndrome with complication ([place]) 86/57/8469   Diabetes ([place]) 02/21/2019   Left rotator cuff tear arthropathy 12/28/2015   Hyperlipidemia 12/24/2015   Tobacco abuse 11/06/2015   Mood disturbance 11/06/2015   CAD (coronary artery disease) 10/16/2015   PAD (peripheral artery disease) ([place]) 10/16/2015   Preventative health care 08/13/2015   Renal artery stenosis ([place]) 07/30/2015   Acute on chronic renal failure ([place]) 07/30/2015   Kidney atrophy 07/30/2015   CKD (chronic kidney disease), stage III ([place]) 07/30/2015   Insomnia 07/22/2015   History of substance abuse ([place]) 07/15/2015   BPH (benign prostatic hyperplasia) 07/15/2015   Gout 07/15/2015   Kidney stones 05/11/2015   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[place]) 04/08/2015   HTN (hypertension) 11/17/2014    Alcohol abuse 11/17/2014    Past Surgical History:  Procedure Laterality Date   ESOPHAGOGASTRODUODENOSCOPY Left 03/18/2019   Procedure: ESOPHAGOGASTRODUODENOSCOPY (EGD);  Surgeon: Jonathon Bellows, MD;  Location: [facility] ENDOSCOPY;  Service: Gastroenterology;  Laterality: Left;   left renal stent placement  2013   LITHOTRIPSY     ORIF HUMERUS FRACTURE Left 11/18/2014   Procedure: OPEN REDUCTION INTERNAL FIXATION (ORIF) DISTAL HUMERUS FRACTURE;  Surgeon: Rozanna Box, MD;  Location: [facility];  Service: Orthopedics;  Laterality: Left;   SPLENECTOMY      Prior to Admission medications   Medication Sig Start Date End Date Taking? Authorizing Provider  allopurinol (ZYLOPRIM) 300 MG tablet Take 150 mg by mouth daily.   Yes [provider]  amLODipine (NORVASC) 5 MG tablet Take 5 mg by mouth daily at 12 noon. 06/04/19  Yes [provider]  aspirin 81 MG chewable tablet Chew 81 mg by mouth daily.   Yes [provider]  escitalopram (LEXAPRO) 10 MG tablet Take 10 mg by mouth daily.   Yes [provider]  folic acid (FOLVITE) 1 MG tablet Take 1 mg by mouth daily.   Yes [provider]  metFORMIN (GLUCOPHAGE) 500 MG tablet Take 500 mg by mouth 2 (two) times daily. 06/04/19  Yes [provider]  metoprolol succinate (TOPROL-XL) 50 MG 24 hr tablet Take 0.5 tablets by  mouth daily. 06/04/19  Yes [provider]  rosuvastatin (CRESTOR) 20 MG tablet Take 20 mg by mouth daily.   Yes [provider]  thiamine (VITAMIN B-1) 100 MG tablet Take 100 mg by mouth daily.   Yes [provider]  traZODone (DESYREL) 50 MG tablet Take 50 mg by mouth daily.   Yes [provider]    Allergies Patient has no known allergies.  Family History  Problem Relation Age of Onset   Heart disease Mother    Heart disease Father    Diabetes Father    Nephrolithiasis Paternal Grandfather    Kidney disease Neg Hx    Prostate cancer Neg Hx      Social History Social History   Tobacco Use   Smoking status: Every Day    Packs/day: 1.00    Years: 50.00    Pack years: 50.00    Types: Cigarettes   Smokeless tobacco: Former    Types: Nurse, children's Use: Never used  Substance Use Topics   Alcohol use: Not Currently    Alcohol/week: 42.0 standard drinks    Types: 42 Cans of beer per week    Comment: quit june 4th   Drug use: No    Review of Systems Constitutional: No fever/chills Eyes: No visual changes. ENT: No sore throat. Cardiovascular: Denies chest pain. Respiratory: Denies shortness of breath. Gastrointestinal: No abdominal pain.  No nausea, no vomiting.  No diarrhea.  No constipation. Genitourinary: Negative for dysuria. Musculoskeletal: Negative for back pain. Skin: Negative for rash. Neurological: Negative for headaches, focal weakness  ____________________________________________   PHYSICAL EXAM:  VITAL SIGNS: ED Triage Vitals  Enc Vitals Group     BP 05/13/21 1538 (!) 151/80     Pulse Rate 05/13/21 1538 (!) 112     Resp 05/13/21 1538 20     Temp 05/13/21 1538 98 F (36.7 C)     Temp Source 05/13/21 1538 Axillary     SpO2 05/13/21 1538 92 %     Weight 05/13/21 1535 145 lb (65.8 kg)     Height 05/13/21 1535 5\' 7"  (1.702 m)     Head Circumference --      Peak Flow --      Pain Score 05/13/21 1535 0     Pain Loc --      Pain Edu? --      Excl. in [place]? --    Constitutional: Alert and oriented. Well appearing and in no acute distress. Eyes: Conjunctivae are normal. PER EOMI. Head: Atraumatic. Nose: No congestion/rhinnorhea. Mouth/Throat: Mucous membranes are moist.  Oropharynx non-erythematous. Neck: No stridor. Cardiovascular: Normal rate, regular rhythm. Grossly normal heart sounds.  Good peripheral circulation. Respiratory: Normal respiratory effort.  No retractions. Lungs CTAB. Gastrointestinal: Soft and nontender. No distention. No abdominal bruits.  Musculoskeletal: No  lower extremity tenderness nor edema.  Neurologic:  Normal speech and language. No gross focal neurologic deficits are appreciated.  Skin:  Skin is warm, dry and intact.  Except for arms with bruises on skin tears on the forearms no rash noted.   ____________________________________________   LABS (all labs ordered are listed, but only abnormal results are displayed)  Labs Reviewed  COMPREHENSIVE METABOLIC PANEL - Abnormal; Notable for the following components:      Result Value   CO2 16 (*)    Glucose, Bld 120 (*)    BUN 27 (*)    Creatinine, Ser 2.58 (*)    GFR, Estimated  26 (*)    Anion gap 19 (*)    All other components within normal limits  ETHANOL - Abnormal; Notable for the following components:   Alcohol, Ethyl (B) 191 (*)    All other components within normal limits  SALICYLATE LEVEL - Abnormal; Notable for the following components:   Salicylate Lvl <2.9 (*)    All other components within normal limits  ACETAMINOPHEN LEVEL - Abnormal; Notable for the following components:   Acetaminophen (Tylenol), Serum <10 (*)    All other components within normal limits  RESP PANEL BY RT-PCR (FLU A&B, COVID) [id number]   ____________________________________________  EKG   ____________________________________________  RADIOLOGY Gertha Calkin, personally viewed and evaluated these images (plain radiographs) as part of my medical decision making, as well as reviewing the written report by the radiologist.  ED MD interpretation:    Official radiology report(s): No results found.  ____________________________________________   PROCEDURES  Procedure(s) performed (including Critical Care):  Procedures   ____________________________________________   INITIAL IMPRESSION / ASSESSMENT AND PLAN / ED COURSE  Patient seen by psychiatry who cannot think of any reason to watch him here in the hospital.  I agree.  Patient is currently awake alert oriented x3 and making  perfect sounds.  He does not appear to be intoxicated clinically.  He will go home with family who will watch him.  None of his scratches and small skin tears on his arms appear to need repair.              ____________________________________________   FINAL CLINICAL IMPRESSION(S) / ED DIAGNOSES  Final diagnoses:  Alcoholic intoxication without complication [facility])     ED Discharge Orders     None        Note:  This document was prepared using Dragon voice recognition software and may include unintentional dictation errors.    Nena Polio, MD 05/14/21 [phone]

## 2021-05-13 NOTE — ED Notes (Signed)
Papers rescinded by Dr Weber Cooks upon 1st exam.  Pending D/C after 7 pm.

## 2021-05-13 NOTE — ED Notes (Signed)
Pt given dinner tray.

## 2021-05-13 NOTE — ED Triage Notes (Signed)
Pt to ED BPD ivc for abnormal behavior, found in neighbors yard, alcohol use.  Pt disoriented to name.

## 2021-05-13 NOTE — ED Notes (Signed)
Psych at bedside.

## 2021-10-18 ENCOUNTER — Inpatient Hospital Stay: Payer: No Typology Code available for payment source

## 2021-10-18 ENCOUNTER — Encounter: Payer: Self-pay | Admitting: Emergency Medicine

## 2021-10-18 ENCOUNTER — Emergency Department: Payer: No Typology Code available for payment source

## 2021-10-18 ENCOUNTER — Inpatient Hospital Stay
Admission: EM | Admit: 2021-10-18 | Discharge: 2021-10-26 | DRG: 871 | Disposition: A | Discharge status: Other Acute Inpatient Hospital | Payer: No Typology Code available for payment source | Attending: Internal Medicine | Admitting: Internal Medicine

## 2021-10-18 ENCOUNTER — Other Ambulatory Visit: Payer: Self-pay

## 2021-10-18 DIAGNOSIS — D631 Anemia in chronic kidney disease: Secondary | ICD-10-CM | POA: Diagnosis present

## 2021-10-18 DIAGNOSIS — R571 Hypovolemic shock: Secondary | ICD-10-CM | POA: Diagnosis present

## 2021-10-18 DIAGNOSIS — F1010 Alcohol abuse, uncomplicated: Secondary | ICD-10-CM | POA: Diagnosis present

## 2021-10-18 DIAGNOSIS — E876 Hypokalemia: Secondary | ICD-10-CM | POA: Diagnosis not present

## 2021-10-18 DIAGNOSIS — I129 Hypertensive chronic kidney disease with stage 1 through stage 4 chronic kidney disease, or unspecified chronic kidney disease: Secondary | ICD-10-CM | POA: Diagnosis present

## 2021-10-18 DIAGNOSIS — N184 Chronic kidney disease, stage 4 (severe): Secondary | ICD-10-CM | POA: Diagnosis present

## 2021-10-18 DIAGNOSIS — D62 Acute posthemorrhagic anemia: Secondary | ICD-10-CM | POA: Diagnosis not present

## 2021-10-18 DIAGNOSIS — E871 Hypo-osmolality and hyponatremia: Secondary | ICD-10-CM | POA: Diagnosis present

## 2021-10-18 DIAGNOSIS — N179 Acute kidney failure, unspecified: Secondary | ICD-10-CM | POA: Diagnosis not present

## 2021-10-18 DIAGNOSIS — S42291A Other displaced fracture of upper end of right humerus, initial encounter for closed fracture: Secondary | ICD-10-CM | POA: Diagnosis not present

## 2021-10-18 DIAGNOSIS — W19XXXA Unspecified fall, initial encounter: Secondary | ICD-10-CM | POA: Diagnosis present

## 2021-10-18 DIAGNOSIS — Z7984 Long term (current) use of oral hypoglycemic drugs: Secondary | ICD-10-CM

## 2021-10-18 DIAGNOSIS — R7881 Bacteremia: Secondary | ICD-10-CM | POA: Diagnosis not present

## 2021-10-18 DIAGNOSIS — R680 Hypothermia, not associated with low environmental temperature: Secondary | ICD-10-CM | POA: Diagnosis present

## 2021-10-18 DIAGNOSIS — T796XXA Traumatic ischemia of muscle, initial encounter: Secondary | ICD-10-CM | POA: Diagnosis not present

## 2021-10-18 DIAGNOSIS — S42201K Unspecified fracture of upper end of right humerus, subsequent encounter for fracture with nonunion: Secondary | ICD-10-CM | POA: Diagnosis not present

## 2021-10-18 DIAGNOSIS — B955 Unspecified streptococcus as the cause of diseases classified elsewhere: Secondary | ICD-10-CM | POA: Diagnosis not present

## 2021-10-18 DIAGNOSIS — I428 Other cardiomyopathies: Secondary | ICD-10-CM | POA: Diagnosis not present

## 2021-10-18 DIAGNOSIS — I739 Peripheral vascular disease, unspecified: Secondary | ICD-10-CM | POA: Diagnosis not present

## 2021-10-18 DIAGNOSIS — E7800 Pure hypercholesterolemia, unspecified: Secondary | ICD-10-CM | POA: Diagnosis present

## 2021-10-18 DIAGNOSIS — Z20822 Contact with and (suspected) exposure to covid-19: Secondary | ICD-10-CM | POA: Diagnosis present

## 2021-10-18 DIAGNOSIS — E872 Acidosis, unspecified: Secondary | ICD-10-CM

## 2021-10-18 DIAGNOSIS — Z9081 Acquired absence of spleen: Secondary | ICD-10-CM

## 2021-10-18 DIAGNOSIS — J449 Chronic obstructive pulmonary disease, unspecified: Secondary | ICD-10-CM | POA: Diagnosis present

## 2021-10-18 DIAGNOSIS — Z7982 Long term (current) use of aspirin: Secondary | ICD-10-CM

## 2021-10-18 DIAGNOSIS — M6282 Rhabdomyolysis: Secondary | ICD-10-CM | POA: Diagnosis present

## 2021-10-18 DIAGNOSIS — R579 Shock, unspecified: Secondary | ICD-10-CM | POA: Diagnosis not present

## 2021-10-18 DIAGNOSIS — N189 Chronic kidney disease, unspecified: Secondary | ICD-10-CM | POA: Diagnosis not present

## 2021-10-18 DIAGNOSIS — Z7189 Other specified counseling: Secondary | ICD-10-CM | POA: Diagnosis not present

## 2021-10-18 DIAGNOSIS — F1027 Alcohol dependence with alcohol-induced persisting dementia: Secondary | ICD-10-CM | POA: Diagnosis not present

## 2021-10-18 DIAGNOSIS — F17210 Nicotine dependence, cigarettes, uncomplicated: Secondary | ICD-10-CM | POA: Diagnosis present

## 2021-10-18 DIAGNOSIS — Z8673 Personal history of transient ischemic attack (TIA), and cerebral infarction without residual deficits: Secondary | ICD-10-CM

## 2021-10-18 DIAGNOSIS — K5900 Constipation, unspecified: Secondary | ICD-10-CM | POA: Diagnosis not present

## 2021-10-18 DIAGNOSIS — N1832 Chronic kidney disease, stage 3b: Secondary | ICD-10-CM | POA: Diagnosis not present

## 2021-10-18 DIAGNOSIS — N401 Enlarged prostate with lower urinary tract symptoms: Secondary | ICD-10-CM | POA: Diagnosis present

## 2021-10-18 DIAGNOSIS — S42211A Unspecified displaced fracture of surgical neck of right humerus, initial encounter for closed fracture: Secondary | ICD-10-CM | POA: Diagnosis present

## 2021-10-18 DIAGNOSIS — R339 Retention of urine, unspecified: Secondary | ICD-10-CM | POA: Diagnosis not present

## 2021-10-18 DIAGNOSIS — D72829 Elevated white blood cell count, unspecified: Secondary | ICD-10-CM | POA: Diagnosis not present

## 2021-10-18 DIAGNOSIS — R001 Bradycardia, unspecified: Secondary | ICD-10-CM | POA: Diagnosis present

## 2021-10-18 DIAGNOSIS — E1122 Type 2 diabetes mellitus with diabetic chronic kidney disease: Secondary | ICD-10-CM | POA: Diagnosis present

## 2021-10-18 DIAGNOSIS — R338 Other retention of urine: Secondary | ICD-10-CM | POA: Diagnosis not present

## 2021-10-18 DIAGNOSIS — E8729 Other acidosis: Secondary | ICD-10-CM | POA: Diagnosis present

## 2021-10-18 DIAGNOSIS — T68XXXA Hypothermia, initial encounter: Secondary | ICD-10-CM

## 2021-10-18 DIAGNOSIS — R0602 Shortness of breath: Secondary | ICD-10-CM

## 2021-10-18 DIAGNOSIS — Z79891 Long term (current) use of opiate analgesic: Secondary | ICD-10-CM

## 2021-10-18 DIAGNOSIS — S40021A Contusion of right upper arm, initial encounter: Secondary | ICD-10-CM | POA: Diagnosis present

## 2021-10-18 DIAGNOSIS — J9601 Acute respiratory failure with hypoxia: Secondary | ICD-10-CM | POA: Diagnosis not present

## 2021-10-18 DIAGNOSIS — I25118 Atherosclerotic heart disease of native coronary artery with other forms of angina pectoris: Secondary | ICD-10-CM | POA: Diagnosis not present

## 2021-10-18 DIAGNOSIS — A409 Streptococcal sepsis, unspecified: Secondary | ICD-10-CM | POA: Diagnosis present

## 2021-10-18 DIAGNOSIS — D649 Anemia, unspecified: Secondary | ICD-10-CM | POA: Diagnosis not present

## 2021-10-18 DIAGNOSIS — M109 Gout, unspecified: Secondary | ICD-10-CM | POA: Diagnosis present

## 2021-10-18 DIAGNOSIS — S4291XA Fracture of right shoulder girdle, part unspecified, initial encounter for closed fracture: Secondary | ICD-10-CM | POA: Diagnosis not present

## 2021-10-18 DIAGNOSIS — Z452 Encounter for adjustment and management of vascular access device: Secondary | ICD-10-CM

## 2021-10-18 DIAGNOSIS — R6521 Severe sepsis with septic shock: Secondary | ICD-10-CM | POA: Diagnosis present

## 2021-10-18 DIAGNOSIS — I2510 Atherosclerotic heart disease of native coronary artery without angina pectoris: Secondary | ICD-10-CM | POA: Diagnosis present

## 2021-10-18 DIAGNOSIS — Z79899 Other long term (current) drug therapy: Secondary | ICD-10-CM

## 2021-10-18 DIAGNOSIS — T796XXD Traumatic ischemia of muscle, subsequent encounter: Secondary | ICD-10-CM | POA: Diagnosis not present

## 2021-10-18 LAB — COMPREHENSIVE METABOLIC PANEL
ALT: 19 U/L (ref 0–44)
AST: 37 U/L (ref 15–41)
Albumin: 3.4 g/dL — ABNORMAL LOW (ref 3.5–5.0)
Alkaline Phosphatase: 100 U/L (ref 38–126)
Anion gap: 34 — ABNORMAL HIGH (ref 5–15)
BUN: 64 mg/dL — ABNORMAL HIGH (ref 8–23)
CO2: 9 mmol/L — ABNORMAL LOW (ref 22–32)
Calcium: 8.1 mg/dL — ABNORMAL LOW (ref 8.9–10.3)
Chloride: 91 mmol/L — ABNORMAL LOW (ref 98–111)
Creatinine, Ser: 4.3 mg/dL — ABNORMAL HIGH (ref 0.61–1.24)
GFR, Estimated: 14 mL/min — ABNORMAL LOW (ref >60)
Glucose, Bld: 170 mg/dL — ABNORMAL HIGH (ref 70–99)
Potassium: 3.5 mmol/L (ref 3.5–5.1)
Sodium: 134 mmol/L — ABNORMAL LOW (ref 135–145)
Total Bilirubin: 1 mg/dL (ref 0.3–1.2)
Total Protein: 6.2 g/dL — ABNORMAL LOW (ref 6.5–8.1)

## 2021-10-18 LAB — TSH: TSH: 1.008 u[IU]/mL (ref 0.350–4.500)

## 2021-10-18 LAB — LACTIC ACID, PLASMA
Lactic Acid, Venous: 8.2 mmol/L (ref 0.5–1.9)
Lactic Acid, Venous: 5.3 mmol/L (ref 0.5–1.9)

## 2021-10-18 LAB — BASIC METABOLIC PANEL
Anion gap: 24 — ABNORMAL HIGH (ref 5–15)
BUN: 59 mg/dL — ABNORMAL HIGH (ref 8–23)
CO2: 10 mmol/L — ABNORMAL LOW (ref 22–32)
Calcium: 6.7 mg/dL — ABNORMAL LOW (ref 8.9–10.3)
Chloride: 100 mmol/L (ref 98–111)
Creatinine, Ser: 3.68 mg/dL — ABNORMAL HIGH (ref 0.61–1.24)
GFR, Estimated: 17 mL/min — ABNORMAL LOW (ref >60)
Glucose, Bld: 192 mg/dL — ABNORMAL HIGH (ref 70–99)
Potassium: 3.5 mmol/L (ref 3.5–5.1)
Sodium: 134 mmol/L — ABNORMAL LOW (ref 135–145)

## 2021-10-18 LAB — URINE DRUG SCREEN, QUALITATIVE (ARMC ONLY)
Amphetamines, Ur Screen: NOT DETECTED
Barbiturates, Ur Screen: NOT DETECTED
Benzodiazepine, Ur Scrn: NOT DETECTED
Cannabinoid 50 Ng, Ur ~~LOC~~: NOT DETECTED
Cocaine Metabolite,Ur ~~LOC~~: NOT DETECTED
MDMA (Ecstasy)Ur Screen: NOT DETECTED
Methadone Scn, Ur: NOT DETECTED
Opiate, Ur Screen: NOT DETECTED
Phencyclidine (PCP) Ur S: NOT DETECTED
Tricyclic, Ur Screen: NOT DETECTED

## 2021-10-18 LAB — CBC WITH DIFFERENTIAL/PLATELET
Abs Immature Granulocytes: 0.36 10*3/uL — ABNORMAL HIGH (ref 0.00–0.07)
Basophils Absolute: 0.1 10*3/uL (ref 0.0–0.1)
Basophils Relative: 0 %
Eosinophils Absolute: 0 10*3/uL (ref 0.0–0.5)
Eosinophils Relative: 0 %
HCT: 34.4 % — ABNORMAL LOW (ref 39.0–52.0)
Hemoglobin: 11.3 g/dL — ABNORMAL LOW (ref 13.0–17.0)
Immature Granulocytes: 2 %
Lymphocytes Relative: 5 %
Lymphs Abs: 1.1 10*3/uL (ref 0.7–4.0)
MCH: 34.5 pg — ABNORMAL HIGH (ref 26.0–34.0)
MCHC: 32.8 g/dL (ref 30.0–36.0)
MCV: 104.9 fL — ABNORMAL HIGH (ref 80.0–100.0)
Monocytes Absolute: 2.3 10*3/uL — ABNORMAL HIGH (ref 0.1–1.0)
Monocytes Relative: 10 %
Neutro Abs: 20.4 10*3/uL — ABNORMAL HIGH (ref 1.7–7.7)
Neutrophils Relative %: 83 %
Platelets: 275 10*3/uL (ref 150–400)
RBC: 3.28 MIL/uL — ABNORMAL LOW (ref 4.22–5.81)
RDW: 15.1 % (ref 11.5–15.5)
WBC: 24.3 10*3/uL — ABNORMAL HIGH (ref 4.0–10.5)
nRBC: 0.1 % (ref 0.0–0.2)

## 2021-10-18 LAB — RESP PANEL BY RT-PCR (FLU A&B, COVID) ARPGX2
Influenza A by PCR: NEGATIVE
Influenza B by PCR: NEGATIVE
SARS Coronavirus 2 by RT PCR: NEGATIVE

## 2021-10-18 LAB — URINALYSIS, ROUTINE W REFLEX MICROSCOPIC
Bilirubin Urine: NEGATIVE
Glucose, UA: NEGATIVE mg/dL
Ketones, ur: NEGATIVE mg/dL
Leukocytes,Ua: NEGATIVE
Nitrite: NEGATIVE
Protein, ur: 100 mg/dL — AB
Specific Gravity, Urine: 1.012 (ref 1.005–1.030)
pH: 5 (ref 5.0–8.0)

## 2021-10-18 LAB — PHOSPHORUS: Phosphorus: 7.6 mg/dL — ABNORMAL HIGH (ref 2.5–4.6)

## 2021-10-18 LAB — BLOOD GAS, VENOUS
Acid-base deficit: 17.9 mmol/L — ABNORMAL HIGH (ref 0.0–2.0)
Bicarbonate: 10.1 mmol/L — ABNORMAL LOW (ref 20.0–28.0)
O2 Saturation: 56.3 %
Patient temperature: 37
pCO2, Ven: 31 mmHg — ABNORMAL LOW (ref 44.0–60.0)
pH, Ven: 7.12 — CL (ref 7.250–7.430)
pO2, Ven: 41 mmHg (ref 32.0–45.0)

## 2021-10-18 LAB — OSMOLALITY: Osmolality: 335 mOsm/kg (ref 275–295)

## 2021-10-18 LAB — LIPASE, BLOOD: Lipase: 57 U/L — ABNORMAL HIGH (ref 11–51)

## 2021-10-18 LAB — PROCALCITONIN: Procalcitonin: 1.25 ng/mL

## 2021-10-18 LAB — SALICYLATE LEVEL: Salicylate Lvl: <7 mg/dL — ABNORMAL LOW (ref 7.0–30.0)

## 2021-10-18 LAB — ACETAMINOPHEN LEVEL: Acetaminophen (Tylenol), Serum: <10 ug/mL — ABNORMAL LOW (ref 10–30)

## 2021-10-18 LAB — CK: Total CK: 990 U/L — ABNORMAL HIGH (ref 49–397)

## 2021-10-18 LAB — MAGNESIUM: Magnesium: 2.6 mg/dL — ABNORMAL HIGH (ref 1.7–2.4)

## 2021-10-18 LAB — ETHANOL: Alcohol, Ethyl (B): 111 mg/dL — ABNORMAL HIGH (ref <10)

## 2021-10-18 MED ORDER — POLYETHYLENE GLYCOL 3350 17 G PO PACK: 17.0000 g | PACK | Freq: Every day | ORAL | Status: DC | PRN

## 2021-10-18 MED ORDER — SODIUM CHLORIDE 0.9 % IV BOLUS
1000.0000 mL | Freq: Once | INTRAVENOUS | Status: AC
  Administered 2021-10-18: 1000 mL via INTRAVENOUS

## 2021-10-18 MED ORDER — METRONIDAZOLE 500 MG/100ML IV SOLN
500.0000 mg | Freq: Once | INTRAVENOUS | Status: AC
  Administered 2021-10-18: 500 mg via INTRAVENOUS
  Filled 2021-10-18: qty 100

## 2021-10-18 MED ORDER — LORAZEPAM 2 MG/ML IJ SOLN
0.0000 mg | Freq: Three times a day (TID) | INTRAMUSCULAR | Status: AC
  Administered 2021-10-21 – 2021-10-22 (×3): 2 mg via INTRAVENOUS
  Filled 2021-10-18 (×4): qty 1

## 2021-10-18 MED ORDER — SODIUM BICARBONATE 8.4 % IV SOLN
50.0000 meq | Freq: Once | INTRAVENOUS | Status: AC
Start: 2021-10-18 — End: 2021-10-18
  Administered 2021-10-18: 50 meq via INTRAVENOUS
  Filled 2021-10-18: qty 50

## 2021-10-18 MED ORDER — DOCUSATE SODIUM 100 MG PO CAPS: 100.0000 mg | ORAL_CAPSULE | Freq: Two times a day (BID) | ORAL | Status: DC | PRN

## 2021-10-18 MED ORDER — HEPARIN SODIUM (PORCINE) 5000 UNIT/ML IJ SOLN
5000.0000 [IU] | Freq: Three times a day (TID) | INTRAMUSCULAR | Status: DC
  Administered 2021-10-19 – 2021-10-21 (×8): 5000 [IU] via SUBCUTANEOUS
  Filled 2021-10-18 (×9): qty 1

## 2021-10-18 MED ORDER — VANCOMYCIN HCL IN DEXTROSE 1-5 GM/200ML-% IV SOLN
1000.0000 mg | Freq: Once | INTRAVENOUS | Status: AC
  Administered 2021-10-18: 1000 mg via INTRAVENOUS
  Filled 2021-10-18: qty 200

## 2021-10-18 MED ORDER — LACTATED RINGERS IV BOLUS
1000.0000 mL | Freq: Once | INTRAVENOUS | Status: AC
  Administered 2021-10-18: 1000 mL via INTRAVENOUS

## 2021-10-18 MED ORDER — THIAMINE HCL 100 MG/ML IJ SOLN
100.0000 mg | Freq: Every day | INTRAMUSCULAR | Status: DC
  Filled 2021-10-18 (×2): qty 2

## 2021-10-18 MED ORDER — SODIUM CHLORIDE 0.9 % IV SOLN
2.0000 g | Freq: Once | INTRAVENOUS | Status: AC
  Administered 2021-10-18: 2 g via INTRAVENOUS
  Filled 2021-10-18: qty 2

## 2021-10-18 MED ORDER — CALCIUM GLUCONATE-NACL 2-0.675 GM/100ML-% IV SOLN
2.0000 g | Freq: Once | INTRAVENOUS | Status: AC
  Administered 2021-10-19: 2000 mg via INTRAVENOUS
  Filled 2021-10-18: qty 100

## 2021-10-18 MED ORDER — FOLIC ACID 1 MG PO TABS
1.0000 mg | ORAL_TABLET | Freq: Every day | ORAL | Status: DC
  Administered 2021-10-19 – 2021-10-26 (×8): 1 mg via ORAL
  Filled 2021-10-18 (×8): qty 1

## 2021-10-18 MED ORDER — SODIUM CHLORIDE 0.9 % IV SOLN
250.0000 mL | INTRAVENOUS | Status: DC
  Administered 2021-10-18: 250 mL via INTRAVENOUS

## 2021-10-18 MED ORDER — LORAZEPAM 1 MG PO TABS
1.0000 mg | ORAL_TABLET | ORAL | Status: AC | PRN
  Administered 2021-10-21: 2 mg via ORAL
  Administered 2021-10-21: 1 mg via ORAL
  Filled 2021-10-18: qty 1
  Filled 2021-10-18: qty 2

## 2021-10-18 MED ORDER — NOREPINEPHRINE 4 MG/250ML-% IV SOLN
2.0000 ug/min | INTRAVENOUS | Status: DC
  Administered 2021-10-18: 2 ug/min via INTRAVENOUS
  Administered 2021-10-19: 9 ug/min via INTRAVENOUS
  Filled 2021-10-18: qty 250

## 2021-10-18 MED ORDER — LORAZEPAM 2 MG/ML IJ SOLN
1.0000 mg | INTRAMUSCULAR | Status: AC | PRN
  Administered 2021-10-20 – 2021-10-21 (×3): 2 mg via INTRAVENOUS
  Filled 2021-10-18: qty 1

## 2021-10-18 MED ORDER — NOREPINEPHRINE 4 MG/250ML-% IV SOLN
0.0000 ug/min | INTRAVENOUS | Status: DC
  Filled 2021-10-18: qty 250

## 2021-10-18 MED ORDER — SODIUM BICARBONATE 8.4 % IV SOLN
INTRAVENOUS | Status: DC
  Filled 2021-10-18 (×4): qty 1000
  Filled 2021-10-18 (×2): qty 150

## 2021-10-18 MED ORDER — CHLORHEXIDINE GLUCONATE CLOTH 2 % EX PADS
6.0000 | MEDICATED_PAD | Freq: Every day | CUTANEOUS | Status: DC
  Administered 2021-10-18 – 2021-10-26 (×9): 6 via TOPICAL

## 2021-10-18 MED ORDER — THIAMINE HCL 100 MG PO TABS
100.0000 mg | ORAL_TABLET | Freq: Every day | ORAL | Status: DC
  Administered 2021-10-19 – 2021-10-26 (×8): 100 mg via ORAL
  Filled 2021-10-18 (×8): qty 1

## 2021-10-18 MED ORDER — LORAZEPAM 2 MG/ML IJ SOLN
0.0000 mg | INTRAMUSCULAR | Status: AC
  Filled 2021-10-18: qty 1

## 2021-10-18 MED ORDER — ADULT MULTIVITAMIN W/MINERALS CH
1.0000 | ORAL_TABLET | Freq: Every day | ORAL | Status: DC
  Administered 2021-10-19 – 2021-10-26 (×8): 1 via ORAL
  Filled 2021-10-18 (×8): qty 1

## 2021-10-18 MED ORDER — PANTOPRAZOLE SODIUM 40 MG IV SOLR
40.0000 mg | Freq: Every day | INTRAVENOUS | Status: DC
  Administered 2021-10-18: 40 mg via INTRAVENOUS
  Filled 2021-10-18: qty 40

## 2021-10-18 NOTE — ED Notes (Signed)
Dr Ellender Hose notified that we were only able to draw 1 set blood cultures.

## 2021-10-18 NOTE — ED Notes (Addendum)
Pt with bair hugger on high - temp 90.3 via temp foley. 2 liters warm NS infusing. Warm blankets applied to pt.

## 2021-10-18 NOTE — H&P (Signed)
NAME:  Edward Singleton, MRN:  [id number], DOB:  1951/08/15, LOS: 0 ADMISSION DATE:  10/18/2021, CONSULTATION DATE: 10/18/2021 REFERRING MD: Dr. Ellender Hose, CHIEF COMPLAINT: Fall  History of Present Illness:  71 year old male with long history of chronic alcohol use was reportedly found down in his house.  He was last seen well several days ago.  The patient bedside is alert and responsive but confused as to the timetable of the last few days.  He reports that he had been drinking too much alcohol and fell.  He believes he fell on Sunday but is unclear as to what happened or the timing of the event.  Police were called to the home and had to break down the door finding him on the floor with significant bruising lying on his right arm.  Per ED/EMS report the patient was confused, hypothermic and hypotensive receiving IV fluids in route to great effect. Per Tamela Oddi, who is healthcare power of attorney, the patient has been binge drinking since the summer.  She brings him groceries, cleans up and lays out his medications as well as having control of his finances.  However she reports he will barter the groceries she gives him 1/2 gallons of liquor.  Both the patient and the sister unclear as to the actual amount of liquor being consumed on a regular basis.  ED course: Upon arrival in the ER patient complaining of cold and thirst, became nauseous with some vomiting episodes.  He received warmed IV fluid, empiric antibiotics.  On-call orthopedist was consulted with additional imaging ordered and instructions to place right arm in a sling.  After aggressive IV fluid resuscitation patient requiring vasopressor support. medications given: Sodium bicarbonate drip, Levophed drip, cefepime/Flagyl and vancomycin, 3 L NS IV fluid bolus Initial Vitals: Hypothermic at 90.5 F, RR 15, bradycardic at 52, hypotensive at 66/40 and SPO2 100% on room air Significant labs: (Labs/ Imaging personally reviewed) I, Domingo Pulse  [place], AGACNP-BC, personally viewed and interpreted this ECG. EKG Interpretation: Date: 10/18/2021, EKG Time: 1750, Rate: 56, Rhythm: Sinus bradycardia, QRS Axis: Normal, Intervals: Prolonged QTC, ST/T Wave abnormalities: None, Narrative Interpretation: SB with prolonged QTC Chemistry: Mild hyponatremia Na+: 134, K+: 3.5, AKI -BUN/Cr.:  64/4.3, AGMA -serum CO2/ AG: 9/34, elevated lipase at 57, CK: 990 Hematology: Leukocytosis WBC: 24.3, Hgb: 11.3,  Lactic/ PCT: 8.2> 5.3/1.25, COVID-19 & Influenza A/B: Negative VBG: 7.12/ 31/ 41/ 10.1 CXR 10/17/21: No active cardiopulmonary disease. Acute comminuted, displaced, and angulated proximal right humeral fracture. 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  No hydronephrosis. Small to moderate-sized hiatal hernia. Fluid in the distal esophagus may be related to reflux or dysmotility. Sigmoid diverticulosis.  No active diverticulitis. CT shoulder right without contrast 10/18/2021: Pending  PCCM consulted for admission due to mild rhabdomyolysis with severe anion gap metabolic acidosis and circulatory shock requiring vasopressor support.  Pertinent  Medical History  Alcohol abuse Atrophic kidney Alcohol induced dementia Type 2 diabetes mellitus Gout Hypercholesteremia Hypertension Pelvic fracture (11/2014)  Significant Hospital Events: Including procedures, antibiotic start and stop dates in addition to other pertinent events   10/18/2021: Admit to ICU with rhabdomyolysis and alcoholic ketoacidosis with circulatory shock requiring vasopressor support.  Interim History / Subjective:  Patient alert and responsive, intermittently confused on peripheral  vasopressors.  Sister bedside is healthcare power of attorney.  Objective   Blood pressure (!) 76/33, pulse (!) 50, temperature (!) 90.4 F (32.4 C), resp. rate 17, height 5\' 8"  (1.727 m), weight 72.6 kg, SpO2 100 %.       No intake or output data in the 24 hours ending 10/18/21 2004 Filed Weights   10/18/21 1839  Weight: 72.6 kg    Examination: General: Adult male, critically ill, lying in bed, NAD HEENT: MM pink/moist, anicteric, atraumatic, neck supple Neuro: A&O x 3-4, intermittently confused, able to follow commands, PERRL +3, MAE > except right arm with acute fracture CV: s1s2 RRR, NSR on monitor, no r/m/g Pulm: Regular, non labored on room air, breath sounds clear-BUL & diminished-BLL GI: soft, rounded, non tender, bs x 4 GU: foley in place with clear amber urine Skin: Significant ecchymosis on right shoulder and upper arm, other areas of scattered ecchymosis and scabbed abrasions  Extremities: warm/dry, pulses + 2 R/P, no edema noted  Resolved Hospital Problem list     Assessment & Plan:  Alcoholic Ketoacidosis High Anion Gap Metabolic Acidosis Mild rhabdomyolysis Acute Kidney Injury superimposed on CKD Stage 4 Baseline Cr: 4.30, Cr on admission: 4.30, CK: 990,  - aggressive IVF resuscitation - sodium bicarbonate amp supplementation followed sodium bicarb drip - f/u CK in AM - Strict I/O's: alert provider if UOP < 0.5 mL/kg/hr - gentle IVF hydration  - Daily BMP, replace electrolytes PRN - Avoid nephrotoxic agents as able, ensure adequate renal perfusion - Consider nephrology if iHD or CRRT indicated   Suspected sepsis with multifactorial circulatory shock due to unknown etiology in the setting of severe metabolic acidosis Lactic: 8.2> 5.3, PCT: 1.25. Imaging & UA has not provided a clear source, will however continue empiric ABX for time being as patient is stabilized Initial interventions/workup included: 3 L of NS & Cefepime/ Vancomycin/ Metronidazole -  additional LR bolus ordered - Supplemental oxygen as needed, to maintain SpO2 > 90% - f/u cultures, trend lactic/ PCT - Daily CBC, monitor WBC/ fever curve - IV antibiotics: cefepime  & vancomycin  - IVF hydration as needed - Continue vasopressors to maintain MAP< 65, norepinephrine - Strict I/O's: alert provider if UOP < 0.5 mL/kg/hr  Prolonged Qtc - serial EKG's - continuous cardiac monitoring - echocardiogram ordered  Acute Right Humerus Fracture - f/u CT of RIGHT shoulder - place sling, per ortho recommendations - low dose fentanyl IV PRN due to kidney failure & nausea/vomiting - Orthopedics consulted appreciate input - falls precautions  ETOH abuse Alcoholic Dementia - CIWA Q 4 h, per protocol  - Ativan per CIWA protocol & PRN - daily thiamine, folic acid & multi-vitamin > patient takes thiamine outpatient, therefore will not give high dose thiamine and simply continue prescription at 100 mg  Type 2 Diabetes Mellitus - Monitor CBG Q 4 hours - SSI moderate dosing - target range while in ICU: 140-180 - follow ICU hyper/hypo-glycemia protocol - outpatient Metformin on hold   Best Practice (right click and "Reselect all SmartList Selections" daily)  Diet/type: NPO w/ oral meds DVT prophylaxis: prophylactic heparin  GI prophylaxis: PPI Lines: N/A Foley:  Yes, and it is still needed Code Status:  full code Last date of multidisciplinary goals of care discussion [10/18/21]  Labs   CBC: Recent Labs  Lab 10/18/21 1800  WBC 24.3*  NEUTROABS 20.4*  HGB 11.3*  HCT 34.4*  MCV 104.9*  PLT 633    Basic Metabolic Panel: Recent Labs  Lab 10/18/21 1800  NA 134*  K 3.5  CL 91*  CO2 9*  GLUCOSE 170*  BUN 64*  CREATININE 4.30*  CALCIUM 8.1*  MG 2.6*   GFR: Estimated Creatinine Clearance: 15.7 mL/min (A) (by C-G formula based on SCr of 4.3 mg/dL (H)). Recent Labs  Lab 10/18/21 1800 10/18/21 1824  WBC 24.3*  --   LATICACIDVEN  --  8.2*    Liver Function  Tests: Recent Labs  Lab 10/18/21 1800  AST 37  ALT 19  ALKPHOS 100  BILITOT 1.0  PROT 6.2*  ALBUMIN 3.4*   Recent Labs  Lab 10/18/21 1800  LIPASE 57*   No results for input(s): AMMONIA in the last 168 hours.  ABG    Component Value Date/Time   HCO3 19.1 (L) 11/17/2014 1215   TCO2 20 11/17/2014 1215   ACIDBASEDEF 7.0 (H) 11/17/2014 1215   O2SAT 47.0 11/17/2014 1215     Coagulation Profile: No results for input(s): INR, PROTIME in the last 168 hours.  Cardiac Enzymes: Recent Labs  Lab 10/18/21 1800  CKTOTAL 990*    HbA1C: Hemoglobin A1C  Date/Time Value Ref Range Status  01/15/2014 01:20 PM 5.6 4.2 - 6.3 % Final    Comment:    The American Diabetes Association recommends that a primary goal of therapy should be <7% and that physicians should reevaluate the treatment regimen in patients with HbA1c values consistently >8%.    Hgb A1c MFr Bld  Date/Time Value Ref Range Status  03/16/2019 04:25 AM 7.0 (H) 4.8 - 5.6 % Final    Comment:    (NOTE) Pre diabetes:          5.7%-6.4% Diabetes:              >6.4% Glycemic control for   <7.0% adults with diabetes     CBG: No results for input(s): GLUCAP in the last 168 hours.  Review of Systems: Positives in BOLD   Gen: Denies fever, chills, weight change, fatigue, night sweats HEENT: Denies blurred vision, double vision, hearing loss, tinnitus, sinus congestion, rhinorrhea, sore throat, neck stiffness, dysphagia PULM: Denies shortness of breath, cough, sputum production, hemoptysis, wheezing CV: Denies chest pain, edema, orthopnea, paroxysmal nocturnal dyspnea, palpitations GI: Denies abdominal pain, nausea, vomiting, diarrhea, hematochezia, melena, constipation, change in bowel habits GU: Denies dysuria, hematuria, polyuria, oliguria, urethral discharge Endocrine: Denies hot or cold intolerance, polyuria, polyphagia or appetite change Derm: Denies rash, dry skin, scaling or peeling skin change Heme: Denies  easy bruising, bleeding, bleeding gums Neuro: Denies headache, numbness, weakness, slurred speech, loss of memory or consciousness  Past Medical History:  He,  has a past medical history of Alcohol abuse, Atrophic kidney, Benign enlargement of prostate, Dementia ([place]), Diabetes mellitus without complication ([place]), Gout, Gout, Hypercholesteremia, Hypertension, Kidney stones, Left flank pain, chronic, and Pelvic fracture ([place]) (11/17/2014).   Surgical History:   Past Surgical History:  Procedure Laterality Date   ESOPHAGOGASTRODUODENOSCOPY Left 03/18/2019   Procedure: ESOPHAGOGASTRODUODENOSCOPY (EGD);  Surgeon: Jonathon Bellows, MD;  Location: [facility] ENDOSCOPY;  Service: Gastroenterology;  Laterality: Left;   left renal stent placement  2013   LITHOTRIPSY     ORIF HUMERUS FRACTURE Left 11/18/2014   Procedure: OPEN REDUCTION INTERNAL FIXATION (ORIF) DISTAL HUMERUS FRACTURE;  Surgeon: Rozanna Box, MD;  Location: [facility];  Service: Orthopedics;  Laterality: Left;   SPLENECTOMY       Social History:   reports that he has been smoking cigarettes. He has a 50.00 pack-year smoking history.  He has quit using smokeless tobacco.  His smokeless tobacco use included chew. He reports that he does not currently use alcohol after a past usage of about 42.0 standard drinks per week. He reports that he does not use drugs.   Family History:  His family history includes Diabetes in his father; Heart disease in his father and mother; Nephrolithiasis in his paternal grandfather. There is no history of Kidney disease or Prostate cancer.   Allergies No Known Allergies   Home Medications  Prior to Admission medications   Medication Sig Start Date End Date Taking? Authorizing Provider  allopurinol (ZYLOPRIM) 300 MG tablet Take 150 mg by mouth daily.    [provider]  amLODipine (NORVASC) 5 MG tablet Take 5 mg by mouth daily at 12 noon. 06/04/19   [provider]  aspirin 81 MG chewable tablet Chew 81  mg by mouth daily.    [provider]  escitalopram (LEXAPRO) 10 MG tablet Take 10 mg by mouth daily.    [provider]  folic acid (FOLVITE) 1 MG tablet Take 1 mg by mouth daily.    [provider]  metFORMIN (GLUCOPHAGE) 500 MG tablet Take 500 mg by mouth 2 (two) times daily. 06/04/19   [provider]  metoprolol succinate (TOPROL-XL) 50 MG 24 hr tablet Take 0.5 tablets by mouth daily. 06/04/19   [provider]  rosuvastatin (CRESTOR) 20 MG tablet Take 20 mg by mouth daily.    [provider]  thiamine (VITAMIN B-1) 100 MG tablet Take 100 mg by mouth daily.    [provider]  traZODone (DESYREL) 50 MG tablet Take 50 mg by mouth daily.    [provider]     Critical care time: 60 minutes     Venetia Night, AGACNP-BC Acute Care Nurse Practitioner [facility]   [phone] / [phone] Please see Amion for pager details.

## 2021-10-18 NOTE — Progress Notes (Signed)
eLink Physician-Brief Progress Note Patient Name: Edward Singleton DOB: 10-16-1950 MRN: [id number]   Date of Service  10/18/2021  HPI/Events of Note  Patient found altered at home, and transported to ED,  he has a right comminuted humeral fracture, hypotensive in the ED r/o septic shock but CXR is clear and CT abdomen / pelvis does not clearly identify a focus of infection.  eICU Interventions  New Patient Evaluation.        [name] 10/18/2021, 11:13 PM

## 2021-10-18 NOTE — Consult Note (Signed)
CODE SEPSIS - PHARMACY COMMUNICATION  **Broad Spectrum Antibiotics should be administered within 1 hour of Sepsis diagnosis**  Time Code Sepsis Called/Page Received: 1812  Antibiotics Ordered: Vancomycin,Cefepime,Flagyl  Time of 1st antibiotic administration: 1937  Additional action taken by pharmacy: none  If necessary, Name of Provider/Nurse Contacted: n/a    Pearla Dubonnet ,PharmD Clinical Pharmacist  10/18/2021  7:44 PM

## 2021-10-18 NOTE — ED Provider Notes (Signed)
[facility] Provider Note    Event Date/Time   First MD Initiated Contact with Patient 10/18/21 1749     (approximate)   History   Fall   HPI  Edward Singleton is a 71 y.o. male here with fall, weakness.  Patient has a history of chronic alcohol use, diabetes, hypertension.  He reportedly was found down in his house.  Last seen several days ago.  He was found with significant bruising, lying to his right arm.  Is unclear how long he was down.  With EMS, he was hypothermic and hypotensive.  He was confused.  He was given fluids without significant improvement in his blood pressure.  On arrival, the patient does states he feels cold and thirsty.  Denies any other complaints but is slightly confused, limiting history.  No family present at bedside.  Level 5 caveat invoked as remainder of history, ROS, and physical exam limited due to patient's ams.      Physical Exam   Triage Vital Signs: ED Triage Vitals  Enc Vitals Group     BP 10/18/21 1745 (!) 65/48     Pulse Rate 10/18/21 1800 (!) 52     Resp 10/18/21 1800 15     Temp 10/18/21 1800 (!) 90.5 F (32.5 C)     Temp src --      SpO2 10/18/21 1800 100 %     Weight --      Height --      Head Circumference --      Peak Flow --      Pain Score --      Pain Loc --      Pain Edu? --      Excl. in [place]? --     Most recent vital signs: Vitals:   10/18/21 2200 10/18/21 2300  BP: (!) 112/56 (!) 113/48  Pulse: 95 96  Resp: 15 10  Temp: 98.6 F (37 C) 100 F (37.8 C)  SpO2: 98% 98%     General: Awake, disheveled.  Appears older than stated age. CV:  Regular rate, no murmurs. Poor perfusion bl UE and LE. 1+ cap refill. Resp:  Normal effort. Lungs CTAB. No wheezing. Abd:  No distention. No tenderness. Other:  RUE with significant ecchymoses, swelling, and TTP over proximal upper arm. Compartments are soft, however. 1+ radial pulse noted but this is symmetric with left. Strength, sensation appears  grossly intact.    ED Results / Procedures / Treatments   Labs (all labs ordered are listed, but only abnormal results are displayed) Labs Reviewed  CBC WITH DIFFERENTIAL/PLATELET - Abnormal; Notable for the following components:      Result Value   WBC 24.3 (*)    RBC 3.28 (*)    Hemoglobin 11.3 (*)    HCT 34.4 (*)    MCV 104.9 (*)    MCH 34.5 (*)    Neutro Abs 20.4 (*)    Monocytes Absolute 2.3 (*)    Abs Immature Granulocytes 0.36 (*)    All other components within normal limits  COMPREHENSIVE METABOLIC PANEL - Abnormal; Notable for the following components:   Sodium 134 (*)    Chloride 91 (*)    CO2 9 (*)    Glucose, Bld 170 (*)    BUN 64 (*)    Creatinine, Ser 4.30 (*)    Calcium 8.1 (*)    Total Protein 6.2 (*)    Albumin 3.4 (*)  GFR, Estimated 14 (*)    Anion gap 34 (*)    All other components within normal limits  CK - Abnormal; Notable for the following components:   Total CK 990 (*)    All other components within normal limits  LACTIC ACID, PLASMA - Abnormal; Notable for the following components:   Lactic Acid, Venous 8.2 (*)    All other components within normal limits  LACTIC ACID, PLASMA - Abnormal; Notable for the following components:   Lactic Acid, Venous 5.3 (*)    All other components within normal limits  LIPASE, BLOOD - Abnormal; Notable for the following components:   Lipase 57 (*)    All other components within normal limits  URINALYSIS, ROUTINE W REFLEX MICROSCOPIC - Abnormal; Notable for the following components:   Color, Urine YELLOW (*)    APPearance HAZY (*)    Hgb urine dipstick MODERATE (*)    Protein, ur 100 (*)    Bacteria, UA RARE (*)    All other components within normal limits  MAGNESIUM - Abnormal; Notable for the following components:   Magnesium 2.6 (*)    All other components within normal limits  OSMOLALITY - Abnormal; Notable for the following components:   Osmolality 335 (*)    All other components within normal  limits  PHOSPHORUS - Abnormal; Notable for the following components:   Phosphorus 7.6 (*)    All other components within normal limits  BLOOD GAS, VENOUS - Abnormal; Notable for the following components:   pH, Ven 7.12 (*)    pCO2, Ven 31 (*)    Bicarbonate 10.1 (*)    Acid-base deficit 17.9 (*)    All other components within normal limits  ACETAMINOPHEN LEVEL - Abnormal; Notable for the following components:   Acetaminophen (Tylenol), Serum <10 (*)    All other components within normal limits  SALICYLATE LEVEL - Abnormal; Notable for the following components:   Salicylate Lvl <8.1 (*)    All other components within normal limits  BASIC METABOLIC PANEL - Abnormal; Notable for the following components:   Sodium 134 (*)    CO2 10 (*)    Glucose, Bld 192 (*)    BUN 59 (*)    Creatinine, Ser 3.68 (*)    Calcium 6.7 (*)    GFR, Estimated 17 (*)    Anion gap 24 (*)    All other components within normal limits  ETHANOL - Abnormal; Notable for the following components:   Alcohol, Ethyl (B) 111 (*)    All other components within normal limits  RESP PANEL BY RT-PCR (FLU A&B, COVID) ARPGX2  CULTURE, BLOOD (ROUTINE X 2)  CULTURE, BLOOD (ROUTINE X 2) W REFLEX TO ID PANEL  MRSA NEXT GEN BY PCR, NASAL  TSH  PROCALCITONIN  URINE DRUG SCREEN, QUALITATIVE ([facility] ONLY)  HIV ANTIBODY (ROUTINE TESTING W REFLEX)  CBC  MAGNESIUM  PHOSPHORUS  COMPREHENSIVE METABOLIC PANEL  PROCALCITONIN  CK  VOLATILES,BLD-ACETONE,ETHANOL,ISOPROP,METHANOL     EKG  Normal sinus rhythm, ventricular rate 56.  PR 2 8, QRS 180, QTc 664.  Prolonged QT.  Possible U waves noted.  No acute ST elevations.   RADIOLOGY CT head: No acute intracranial abnormality, reviewed by me CT C-spine: Reviewed by me, no acute bony abdomen CT abdomen/pelvis: Possible reflux, otherwise no significant acute abnormality, no evidence of obstruction, reviewed by me Chest x-ray: Reviewed by me, shows displaced proximal right humeral  fracture, but no evidence of obvious rib fractures or pneumothorax. DG  humerus right: Comminuted, displaced angulated fracture of the proximal right humerus   PROCEDURES:  Critical Care performed: Yes, see critical care procedure note(s)  .Critical Care Performed by: Duffy Bruce, MD Authorized by: Duffy Bruce, MD   Critical care provider statement:    Critical care time (minutes):  30   Critical care time was exclusive of:  Separately billable procedures and treating other patients   Critical care was necessary to treat or prevent imminent or life-threatening deterioration of the following conditions:  Cardiac failure, circulatory failure, respiratory failure, sepsis and shock   Critical care was time spent personally by me on the following activities:  Development of treatment plan with patient or surrogate, discussions with consultants, evaluation of patient's response to treatment, examination of patient, ordering and review of laboratory studies, ordering and review of radiographic studies, ordering and performing treatments and interventions, pulse oximetry, re-evaluation of patient's condition and review of old charts   Care discussed with: admitting provider   Ultrasound ED Peripheral IV (Provider)  Date/Time: 10/18/2021 9:04 PM Performed by: Duffy Bruce, MD Authorized by: Duffy Bruce, MD   Procedure details:    Indications: hypotension and multiple failed IV attempts     Skin Prep: chlorhexidine gluconate     Location:  Left anterior forearm   Angiocath:  20 G   Bedside Ultrasound Guided: Yes     Images: not archived     Patient tolerated procedure without complications: Yes     Dressing applied: Yes      MEDICATIONS ORDERED IN ED: Medications  0.9 %  sodium chloride infusion (250 mLs Intravenous New Bag/Given 10/18/21 1936)  norepinephrine (LEVOPHED) 4mg  in 281mL (0.016 mg/mL) premix infusion (9 mcg/min Intravenous Rate/Dose Change 10/18/21 2023)  sodium  bicarbonate 150 mEq in dextrose 5 % 1,150 mL infusion ( Intravenous Infusion Verify 10/18/21 2109)  docusate sodium (COLACE) capsule 100 mg (has no administration in time range)  polyethylene glycol (MIRALAX / GLYCOLAX) packet 17 g (has no administration in time range)  heparin injection 5,000 Units (has no administration in time range)  pantoprazole (PROTONIX) injection 40 mg (40 mg Intravenous Given 10/18/21 2110)  LORazepam (ATIVAN) tablet 1-4 mg (has no administration in time range)    Or  LORazepam (ATIVAN) injection 1-4 mg (has no administration in time range)  folic acid (FOLVITE) tablet 1 mg (has no administration in time range)  multivitamin with minerals tablet 1 tablet (has no administration in time range)  LORazepam (ATIVAN) injection 0-4 mg (has no administration in time range)    Followed by  LORazepam (ATIVAN) injection 0-4 mg (has no administration in time range)  thiamine tablet 100 mg (has no administration in time range)    Or  thiamine (B-1) injection 100 mg (has no administration in time range)  Chlorhexidine Gluconate Cloth 2 % PADS 6 each (6 each Topical Given 10/18/21 2259)  sodium chloride 0.9 % bolus 1,000 mL (0 mLs Intravenous Stopped 10/18/21 1906)  sodium chloride 0.9 % bolus 1,000 mL (0 mLs Intravenous Stopped 10/18/21 1906)  ceFEPIme (MAXIPIME) 2 g in sodium chloride 0.9 % 100 mL IVPB (0 g Intravenous Stopped 10/18/21 2023)  metroNIDAZOLE (FLAGYL) IVPB 500 mg (0 mg Intravenous Stopped 10/18/21 2225)  vancomycin (VANCOCIN) IVPB 1000 mg/200 mL premix (1,000 mg Intravenous New Bag/Given 10/18/21 2226)  sodium chloride 0.9 % bolus 1,000 mL (0 mLs Intravenous Stopped 10/18/21 1923)  sodium bicarbonate injection 50 mEq (50 mEq Intravenous Given 10/18/21 2100)  lactated ringers bolus 1,000 mL (1,000 mLs  Intravenous New Bag/Given 10/18/21 2235)     IMPRESSION / MDM / ASSESSMENT AND PLAN / ED COURSE  I reviewed the triage vital signs and the nursing notes.                                The patient is on the cardiac monitor to evaluate for evidence of arrhythmia and/or significant heart rate changes.   Ddx: Shock 2/2 sepsis, hypovolemia, severe acidosis, alcohol/toxic alcohol ingestion  71 year old male here with altered mental status, hypotension.  Patient lab work shows profound metabolic acidosis, acute kidney injury, rhabdomyolysis, as well as significant leukocytosis with lactic acidosis of 8.  I suspect most of this is secondary to dehydration, alcohol abuse, and prolonged downtime, but cannot rule out occult sepsis so will start empiric antibiotics.  Patient given 3 L of normal saline, warmed, for his hypothermia as well as a bear hugger.  IV placed by me.  Isotonic bicarb started.  IV vancomycin and cefepime given.  Regarding his fall, imaging as above, does show significant humeral fracture.  This was discussed with orthopedics Dr. Christia Reading, will place in a sling and he will order a CT.  Otherwise, no apparent traumatic injury.  CT of the abdomen and pelvis obtained due to his profound lactic acidosis and is unremarkable.  CT head without obvious stroke.  UDS negative.  Current plan is to admit to the ICU for persistent hypotension and profound acidosis with shock.  Patient given 3 L of normal saline and will start on peripheral Levophed given his persistent hypotension.   MEDICATIONS GIVEN IN ED: Medications  0.9 %  sodium chloride infusion (250 mLs Intravenous New Bag/Given 10/18/21 1936)  norepinephrine (LEVOPHED) 4mg  in 250mL (0.016 mg/mL) premix infusion (9 mcg/min Intravenous Rate/Dose Change 10/18/21 2023)  sodium bicarbonate 150 mEq in dextrose 5 % 1,150 mL infusion ( Intravenous Infusion Verify 10/18/21 2109)  docusate sodium (COLACE) capsule 100 mg (has no administration in time range)  polyethylene glycol (MIRALAX / GLYCOLAX) packet 17 g (has no administration in time range)  heparin injection 5,000 Units (has no administration in time range)  pantoprazole  (PROTONIX) injection 40 mg (40 mg Intravenous Given 10/18/21 2110)  LORazepam (ATIVAN) tablet 1-4 mg (has no administration in time range)    Or  LORazepam (ATIVAN) injection 1-4 mg (has no administration in time range)  folic acid (FOLVITE) tablet 1 mg (has no administration in time range)  multivitamin with minerals tablet 1 tablet (has no administration in time range)  LORazepam (ATIVAN) injection 0-4 mg (has no administration in time range)    Followed by  LORazepam (ATIVAN) injection 0-4 mg (has no administration in time range)  thiamine tablet 100 mg (has no administration in time range)    Or  thiamine (B-1) injection 100 mg (has no administration in time range)  Chlorhexidine Gluconate Cloth 2 % PADS 6 each (6 each Topical Given 10/18/21 2259)  sodium chloride 0.9 % bolus 1,000 mL (0 mLs Intravenous Stopped 10/18/21 1906)  sodium chloride 0.9 % bolus 1,000 mL (0 mLs Intravenous Stopped 10/18/21 1906)  ceFEPIme (MAXIPIME) 2 g in sodium chloride 0.9 % 100 mL IVPB (0 g Intravenous Stopped 10/18/21 2023)  metroNIDAZOLE (FLAGYL) IVPB 500 mg (0 mg Intravenous Stopped 10/18/21 2225)  vancomycin (VANCOCIN) IVPB 1000 mg/200 mL premix (1,000 mg Intravenous New Bag/Given 10/18/21 2226)  sodium chloride 0.9 % bolus 1,000 mL (0 mLs Intravenous Stopped 10/18/21 1923)  sodium bicarbonate injection 50 mEq (50 mEq Intravenous Given 10/18/21 2100)  lactated ringers bolus 1,000 mL (1,000 mLs Intravenous New Bag/Given 10/18/21 2235)     Consults: Dr. Mack Guise, Orthopedics - discussed and messaged in [place], will obtain CT Humerus. Domingo Pulse with [place] team, consulted for admission.   EMR reviewed ED visit 8/4 for EtOH. Behavioral health/psych notes from that visit re: EtOH abuse.     FINAL CLINICAL IMPRESSION(S) / ED DIAGNOSES   Final diagnoses:  Shock ([employer])  Metabolic acidosis  AKI (acute kidney injury) ([place])  Leukocytosis, unspecified type  Hypothermia, initial encounter     Rx / DC Orders   ED  Discharge Orders     None        Note:  This document was prepared using Dragon voice recognition software and may include unintentional dictation errors.   Duffy Bruce, MD 10/18/21 2340

## 2021-10-18 NOTE — Consult Note (Signed)
PHARMACY CONSULT NOTE  Pharmacy Consult for Electrolyte Monitoring and Replacement   Recent Labs: Potassium (mmol/L)  Date Value  10/18/2021 3.5  01/16/2014 5.0   Magnesium (mg/dL)  Date Value  10/18/2021 2.6 (H)  01/15/2014 1.2 (L)   Calcium (mg/dL)  Date Value  10/18/2021 8.1 (L)   Calcium, Total (mg/dL)  Date Value  01/16/2014 8.5   Albumin (g/dL)  Date Value  10/18/2021 3.4 (L)  10/16/2015 4.7   Phosphorus (mg/dL)  Date Value  03/19/2019 2.3 (L)   Sodium (mmol/L)  Date Value  10/18/2021 134 (L)  08/11/2015 136 (A)  01/16/2014 139   Assessment: Patient is a 71 y/o M with medical history including EtOH use disorder, dementia, history of substance abuse disorder, BPH, gout, CKD, HTN, CAD, PAD, HLD, diabetes who was BIBEMS from home on 1/9 after being found down for unknown period of time. Patient disposition is ICU. PCCM managing care. Pharmacy consulted to assist with electrolyte monitoring and replacement as indicated.  MIVF: Isotonic bicarbonate gtt at 125 cc/hr  Goal of Therapy:  Electrolytes within normal limits  Plan:  --Admission labs notable for Scr 4.3 (last Scr 2.58 on 05/13/21), elevated anion gap metabolic acidosis. Patient on MIVF as above --No electrolyte replacement indicated at this time. Will follow-up with AM labs tomorrow  Benita Gutter 10/18/2021 9:13 PM

## 2021-10-18 NOTE — Consult Note (Signed)
PHARMACY -  BRIEF ANTIBIOTIC NOTE   Pharmacy has received consult(s) for Vancomycin and Cefepime from an ED provider.  The patient's profile has been reviewed for ht/wt/allergies/indication/available labs.    One time order(s) placed for Vancomycin 1g IV and Cefepime 2g IV x 1 dose each.  Further antibiotics/pharmacy consults should be ordered by admitting physician if indicated.                       Thank you, Pearla Dubonnet 10/18/2021  6:16 PM

## 2021-10-18 NOTE — Sepsis Progress Note (Signed)
Elink following for Sepsis Protocol 

## 2021-10-18 NOTE — ED Triage Notes (Addendum)
Pt ems from home. Pt found on the floor after unknown time down. Pt awake, cold  - temp 90.4, extensive bruising right upper arm.

## 2021-10-18 NOTE — Progress Notes (Signed)
An USGPIV (ultrasound guided PIV) has been placed for short-term vasopressor infusion. A correctly placed ivWatch must be used when administering Vasopressors. Should this treatment be needed beyond 72 hours, central line access should be obtained.  It will be the responsibility of the bedside nurse to follow best practice to prevent extravasations.   ?

## 2021-10-19 ENCOUNTER — Inpatient Hospital Stay: Payer: No Typology Code available for payment source

## 2021-10-19 ENCOUNTER — Inpatient Hospital Stay (HOSPITAL_COMMUNITY)
Admit: 2021-10-19 | Discharge: 2021-10-19 | Disposition: A | Discharge status: Home / Self Care | Payer: No Typology Code available for payment source | Attending: Pulmonary Disease | Admitting: Pulmonary Disease

## 2021-10-19 DIAGNOSIS — I428 Other cardiomyopathies: Secondary | ICD-10-CM | POA: Diagnosis not present

## 2021-10-19 DIAGNOSIS — S42291A Other displaced fracture of upper end of right humerus, initial encounter for closed fracture: Secondary | ICD-10-CM

## 2021-10-19 DIAGNOSIS — T796XXA Traumatic ischemia of muscle, initial encounter: Secondary | ICD-10-CM | POA: Diagnosis not present

## 2021-10-19 DIAGNOSIS — E872 Acidosis, unspecified: Secondary | ICD-10-CM

## 2021-10-19 DIAGNOSIS — B955 Unspecified streptococcus as the cause of diseases classified elsewhere: Secondary | ICD-10-CM

## 2021-10-19 DIAGNOSIS — R7881 Bacteremia: Secondary | ICD-10-CM

## 2021-10-19 DIAGNOSIS — N179 Acute kidney failure, unspecified: Secondary | ICD-10-CM | POA: Diagnosis not present

## 2021-10-19 DIAGNOSIS — D72829 Elevated white blood cell count, unspecified: Secondary | ICD-10-CM

## 2021-10-19 DIAGNOSIS — W19XXXA Unspecified fall, initial encounter: Secondary | ICD-10-CM | POA: Diagnosis not present

## 2021-10-19 DIAGNOSIS — R579 Shock, unspecified: Secondary | ICD-10-CM

## 2021-10-19 DIAGNOSIS — T68XXXA Hypothermia, initial encounter: Secondary | ICD-10-CM | POA: Diagnosis not present

## 2021-10-19 LAB — BLOOD GAS, VENOUS
Acid-Base Excess: 0.4 mmol/L (ref 0.0–2.0)
Bicarbonate: 24.6 mmol/L (ref 20.0–28.0)
FIO2: 0.21
O2 Saturation: 67.6 %
Patient temperature: 37
pCO2, Ven: 37 mmHg — ABNORMAL LOW (ref 44.0–60.0)
pH, Ven: 7.43 (ref 7.250–7.430)
pO2, Ven: 34 mmHg (ref 32.0–45.0)

## 2021-10-19 LAB — CK: Total CK: 2341 U/L — ABNORMAL HIGH (ref 49–397)

## 2021-10-19 LAB — HEMOGLOBIN A1C
Hgb A1c MFr Bld: 6.2 % — ABNORMAL HIGH (ref 4.8–5.6)
Mean Plasma Glucose: 131.24 mg/dL

## 2021-10-19 LAB — BLOOD CULTURE ID PANEL (REFLEXED) - BCID2

## 2021-10-19 LAB — COMPREHENSIVE METABOLIC PANEL WITH GFR
ALT: 21 U/L (ref 0–44)
AST: 46 U/L — ABNORMAL HIGH (ref 15–41)
Albumin: 3 g/dL — ABNORMAL LOW (ref 3.5–5.0)
Alkaline Phosphatase: 83 U/L (ref 38–126)
Anion gap: 15 (ref 5–15)
BUN: 59 mg/dL — ABNORMAL HIGH (ref 8–23)
CO2: 23 mmol/L (ref 22–32)
Calcium: 7.5 mg/dL — ABNORMAL LOW (ref 8.9–10.3)
Chloride: 92 mmol/L — ABNORMAL LOW (ref 98–111)
Creatinine, Ser: 3.32 mg/dL — ABNORMAL HIGH (ref 0.61–1.24)
GFR, Estimated: 19 mL/min — ABNORMAL LOW
Glucose, Bld: 214 mg/dL — ABNORMAL HIGH (ref 70–99)
Potassium: 2.8 mmol/L — ABNORMAL LOW (ref 3.5–5.1)
Sodium: 130 mmol/L — ABNORMAL LOW (ref 135–145)
Total Bilirubin: 1 mg/dL (ref 0.3–1.2)
Total Protein: 5.3 g/dL — ABNORMAL LOW (ref 6.5–8.1)

## 2021-10-19 LAB — BASIC METABOLIC PANEL
Anion gap: 8 (ref 5–15)
BUN: 48 mg/dL — ABNORMAL HIGH (ref 8–23)
CO2: 31 mmol/L (ref 22–32)
Calcium: 7.5 mg/dL — ABNORMAL LOW (ref 8.9–10.3)
Chloride: 94 mmol/L — ABNORMAL LOW (ref 98–111)
Creatinine, Ser: 2.56 mg/dL — ABNORMAL HIGH (ref 0.61–1.24)
GFR, Estimated: 26 mL/min — ABNORMAL LOW (ref >60)
Glucose, Bld: 122 mg/dL — ABNORMAL HIGH (ref 70–99)
Potassium: 3.1 mmol/L — ABNORMAL LOW (ref 3.5–5.1)
Sodium: 133 mmol/L — ABNORMAL LOW (ref 135–145)

## 2021-10-19 LAB — ECHOCARDIOGRAM COMPLETE
Height: 68 in
S' Lateral: 2.75 cm
Weight: 2560 oz

## 2021-10-19 LAB — CBC
HCT: 24 % — ABNORMAL LOW (ref 39.0–52.0)
Hemoglobin: 8.6 g/dL — ABNORMAL LOW (ref 13.0–17.0)
MCH: 33.9 pg (ref 26.0–34.0)
MCHC: 35.8 g/dL (ref 30.0–36.0)
MCV: 94.5 fL (ref 80.0–100.0)
Platelets: 240 K/uL (ref 150–400)
RBC: 2.54 MIL/uL — ABNORMAL LOW (ref 4.22–5.81)
RDW: 14.3 % (ref 11.5–15.5)
WBC: 18.4 K/uL — ABNORMAL HIGH (ref 4.0–10.5)
nRBC: 0.1 % (ref 0.0–0.2)

## 2021-10-19 LAB — PHOSPHORUS: Phosphorus: 4.9 mg/dL — ABNORMAL HIGH (ref 2.5–4.6)

## 2021-10-19 LAB — GLUCOSE, CAPILLARY
Glucose-Capillary: 244 mg/dL — ABNORMAL HIGH (ref 70–99)
Glucose-Capillary: 207 mg/dL — ABNORMAL HIGH (ref 70–99)
Glucose-Capillary: 200 mg/dL — ABNORMAL HIGH (ref 70–99)
Glucose-Capillary: 155 mg/dL — ABNORMAL HIGH (ref 70–99)
Glucose-Capillary: 154 mg/dL — ABNORMAL HIGH (ref 70–99)
Glucose-Capillary: 137 mg/dL — ABNORMAL HIGH (ref 70–99)

## 2021-10-19 LAB — POTASSIUM: Potassium: 3.1 mmol/L — ABNORMAL LOW (ref 3.5–5.1)

## 2021-10-19 LAB — LIPASE, BLOOD: Lipase: 112 U/L — ABNORMAL HIGH (ref 11–51)

## 2021-10-19 LAB — PROCALCITONIN: Procalcitonin: 1.34 ng/mL

## 2021-10-19 LAB — LACTIC ACID, PLASMA: Lactic Acid, Venous: 2.1 mmol/L (ref 0.5–1.9)

## 2021-10-19 LAB — MRSA NEXT GEN BY PCR, NASAL: MRSA by PCR Next Gen: NOT DETECTED

## 2021-10-19 LAB — MAGNESIUM: Magnesium: 1.7 mg/dL (ref 1.7–2.4)

## 2021-10-19 LAB — HIV ANTIBODY (ROUTINE TESTING W REFLEX): HIV Screen 4th Generation wRfx: NONREACTIVE

## 2021-10-19 MED ORDER — SODIUM CHLORIDE 0.9 % IV SOLN
2.0000 g | INTRAVENOUS | Status: DC
  Administered 2021-10-19: 2 g via INTRAVENOUS
  Filled 2021-10-19 (×2): qty 2

## 2021-10-19 MED ORDER — LACTATED RINGERS IV BOLUS
1000.0000 mL | Freq: Once | INTRAVENOUS | Status: AC
  Administered 2021-10-19: 1000 mL via INTRAVENOUS

## 2021-10-19 MED ORDER — ACETAMINOPHEN 325 MG PO TABS: 325.0000 mg | ORAL_TABLET | Freq: Four times a day (QID) | ORAL | Status: DC | PRN

## 2021-10-19 MED ORDER — FENTANYL CITRATE PF 50 MCG/ML IJ SOSY: 25.0000 ug | PREFILLED_SYRINGE | INTRAMUSCULAR | Status: DC | PRN

## 2021-10-19 MED ORDER — FENTANYL CITRATE PF 50 MCG/ML IJ SOSY
25.0000 ug | PREFILLED_SYRINGE | Freq: Once | INTRAMUSCULAR | Status: AC
  Administered 2021-10-19: 25 ug via INTRAVENOUS
  Filled 2021-10-19: qty 1

## 2021-10-19 MED ORDER — FENTANYL CITRATE PF 50 MCG/ML IJ SOSY
25.0000 ug | PREFILLED_SYRINGE | INTRAMUSCULAR | Status: DC | PRN
  Administered 2021-10-19 (×2): 25 ug via INTRAVENOUS
  Filled 2021-10-19 (×2): qty 1

## 2021-10-19 MED ORDER — MAGNESIUM SULFATE 2 GM/50ML IV SOLN
2.0000 g | Freq: Once | INTRAVENOUS | Status: AC
  Administered 2021-10-19: 2 g via INTRAVENOUS
  Filled 2021-10-19: qty 50

## 2021-10-19 MED ORDER — VANCOMYCIN HCL IN DEXTROSE 1-5 GM/200ML-% IV SOLN
1000.0000 mg | Freq: Once | INTRAVENOUS | Status: DC
  Filled 2021-10-19: qty 200

## 2021-10-19 MED ORDER — SODIUM BICARBONATE 8.4 % IV SOLN
INTRAVENOUS | Status: DC
  Filled 2021-10-19 (×4): qty 1000
  Filled 2021-10-19: qty 150
  Filled 2021-10-19: qty 1000

## 2021-10-19 MED ORDER — PANTOPRAZOLE SODIUM 40 MG PO TBEC
40.0000 mg | DELAYED_RELEASE_TABLET | Freq: Every day | ORAL | Status: DC
  Administered 2021-10-19 – 2021-10-25 (×7): 40 mg via ORAL
  Filled 2021-10-19 (×7): qty 1

## 2021-10-19 MED ORDER — LACTATED RINGERS IV BOLUS
2000.0000 mL | Freq: Once | INTRAVENOUS | Status: AC
  Administered 2021-10-19: 2000 mL via INTRAVENOUS

## 2021-10-19 MED ORDER — POTASSIUM CHLORIDE CRYS ER 20 MEQ PO TBCR
40.0000 meq | EXTENDED_RELEASE_TABLET | Freq: Once | ORAL | Status: AC
  Administered 2021-10-19: 40 meq via ORAL
  Filled 2021-10-19: qty 2

## 2021-10-19 MED ORDER — LACTATED RINGERS IV SOLN: INTRAVENOUS | Status: DC

## 2021-10-19 MED ORDER — SPIRITUS FRUMENTI
1.0000 | Freq: Three times a day (TID) | ORAL | Status: DC
  Administered 2021-10-19 – 2021-10-22 (×6): 1 via ORAL
  Filled 2021-10-19 (×12): qty 1

## 2021-10-19 MED ORDER — FENTANYL CITRATE PF 50 MCG/ML IJ SOSY
50.0000 ug | PREFILLED_SYRINGE | INTRAMUSCULAR | Status: DC | PRN
  Administered 2021-10-19 – 2021-10-21 (×8): 50 ug via INTRAVENOUS
  Filled 2021-10-19 (×8): qty 1

## 2021-10-19 MED ORDER — INSULIN ASPART 100 UNIT/ML IJ SOLN
0.0000 [IU] | INTRAMUSCULAR | Status: DC
  Administered 2021-10-19 (×3): 3 [IU] via SUBCUTANEOUS
  Administered 2021-10-19 – 2021-10-20 (×3): 2 [IU] via SUBCUTANEOUS
  Administered 2021-10-20: 3 [IU] via SUBCUTANEOUS
  Administered 2021-10-20: 2 [IU] via SUBCUTANEOUS
  Filled 2021-10-19 (×8): qty 1

## 2021-10-19 MED ORDER — POTASSIUM CHLORIDE 10 MEQ/50ML IV SOLN
10.0000 meq | INTRAVENOUS | Status: AC
  Administered 2021-10-19 (×2): 10 meq via INTRAVENOUS
  Filled 2021-10-19 (×2): qty 50

## 2021-10-19 MED ORDER — VANCOMYCIN HCL 750 MG/150ML IV SOLN
750.0000 mg | Freq: Once | INTRAVENOUS | Status: AC
  Administered 2021-10-19: 750 mg via INTRAVENOUS
  Filled 2021-10-19: qty 150

## 2021-10-19 NOTE — Consult Note (Signed)
ORTHOPAEDIC CONSULTATION  REQUESTING PHYSICIAN: Flora Lipps, MD  Chief Complaint: right proximal humerus fracture  HPI: Edward Singleton is a 71 y.o. male presented to the [facility] regional emergency department yesterday after being found down for an undetermined length of time.  Patient had swelling and ecchymosis of the right arm.  X-rays in the ER demonstrated a comminuted fracture of the right proximal humerus.  Orthopedics is consulted for management of this injury.  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 ENDOSCOPY;  Service: Gastroenterology;  Laterality: Left;   left renal stent placement  2013   LITHOTRIPSY     ORIF HUMERUS FRACTURE Left 11/18/2014   Procedure: OPEN REDUCTION INTERNAL FIXATION (ORIF) DISTAL HUMERUS FRACTURE;  Surgeon: Rozanna Box, MD;  Location: [place];  Service: Orthopedics;  Laterality: Left;   SPLENECTOMY     Social History   Socioeconomic History   Marital status: Single    Spouse name: Not on file   Number of children: Not on file   Years of education: Not on file   Highest education level: Not on file  Occupational History   Not on file  Tobacco Use   Smoking status: Every Day    Packs/day: 1.00    Years: 50.00    Pack years: 50.00    Types: Cigarettes   Smokeless tobacco: Former    Types: Nurse, children's Use: Never used  Substance and Sexual Activity   Alcohol use: Not Currently    Alcohol/week: 42.0 standard drinks    Types: 42 Cans of beer per week     Comment: quit june 4th   Drug use: No   Sexual activity: Not on file  Other Topics Concern   Not on file  Social History Narrative   Not on file   Social Determinants of Health   Financial Resource Strain: Not on file  Food Insecurity: Not on file  Transportation Needs: Not on file  Physical Activity: Not on file  Stress: Not on file  Social Connections: Not on file   Family History  Problem Relation Age of Onset   Heart disease Mother    Heart disease Father    Diabetes Father    Nephrolithiasis Paternal Grandfather    Kidney disease Neg Hx    Prostate cancer Neg Hx    No Known Allergies Prior to Admission medications   Medication Sig Start Date End Date Taking? Authorizing Provider  allopurinol (ZYLOPRIM) 300 MG tablet Take 150 mg by mouth daily.    [provider]  amLODipine (NORVASC) 5 MG tablet Take 5 mg by mouth daily at 12 noon. 06/04/19   [provider]  aspirin 81 MG chewable tablet Chew 81 mg by mouth daily.    [provider]  escitalopram (LEXAPRO) 10 MG tablet Take 10 mg by mouth daily.    [provider]  folic acid (FOLVITE) 1 MG tablet Take 1  mg by mouth daily.    [provider]  metFORMIN (GLUCOPHAGE) 500 MG tablet Take 500 mg by mouth 2 (two) times daily. 06/04/19   [provider]  metoprolol succinate (TOPROL-XL) 50 MG 24 hr tablet Take 0.5 tablets by mouth daily. 06/04/19   [provider]  rosuvastatin (CRESTOR) 20 MG tablet Take 20 mg by mouth daily.    [provider]  thiamine (VITAMIN B-1) 100 MG tablet Take 100 mg by mouth daily.    [provider]  traZODone (DESYREL) 50 MG tablet Take 50 mg by mouth daily.    [provider]   CT ABDOMEN PELVIS WO CONTRAST  Result Date: 10/18/2021 CLINICAL DATA:  Abdominal pain, acute, nonlocalized. Found on floor. EXAM: CT ABDOMEN AND PELVIS WITHOUT CONTRAST TECHNIQUE: Multidetector CT imaging of the abdomen and pelvis was  performed following the standard protocol without IV contrast. COMPARISON:  05/11/2015 FINDINGS: Lower chest: Small to moderate-sized hiatal hernia. Fluid in the distal esophagus may reflect reflux. No effusions. Coronary artery and aortic calcifications. Hepatobiliary: No focal hepatic abnormality. Gallbladder unremarkable. Pancreas: No focal abnormality or ductal dilatation. Spleen: Prior splenectomy Adrenals/Urinary Tract: Left kidney is atrophic. 5.3 cm cyst off the midpole of the left kidney. Bilateral nephrolithiasis. No hydronephrosis. Small scattered low-density lesions in the right kidney likely reflects cysts. Adrenal glands unremarkable. Foley catheter in the bladder which is decompressed. Stomach/Bowel: Sigmoid diverticulosis. No active diverticulitis. Mild distention of the stomach. Small bowel is decompressed. Vascular/Lymphatic: Heavily calcified aorta and branch vessels. No aneurysm or adenopathy. Reproductive: No visible focal abnormality. Other: No free fluid or free air. Musculoskeletal: Old healed fractures of the left superior and inferior pubic rami. No acute bony abnormality. IMPRESSION: Bilateral nephrolithiasis.  No hydronephrosis. Small to moderate-sized hiatal hernia. Fluid in the distal esophagus may be related to reflux or dysmotility. Sigmoid diverticulosis.  No active diverticulitis. Heavily calcified aorta. Electronically Signed   By: Rolm Baptise M.D.   On: 10/18/2021 20:26   CT HEAD WO CONTRAST (5MM)  Result Date: 10/18/2021 CLINICAL DATA:  Head trauma, minor (Age >= 65y).  Found on floor. EXAM: CT HEAD WITHOUT CONTRAST TECHNIQUE: Contiguous axial images were obtained from the base of the skull through the vertex without intravenous contrast. 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 There is atrophy and chronic small vessel disease changes. No acute intracranial abnormality. Specifically, no  hemorrhage, hydrocephalus, mass lesion, acute infarction, or significant intracranial injury. Vascular: No hyperdense vessel or unexpected calcification. Skull: No acute calvarial abnormality. 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 Atrophy, chronic microvascular disease. No acute intracranial abnormality. Electronically Signed   By: Rolm Baptise M.D.   On: 10/18/2021 20:18   CT Cervical Spine Wo Contrast  Result Date: 10/18/2021 CLINICAL DATA:  Neck trauma (Age >= 65y).  Found on floor. EXAM: CT CERVICAL SPINE WITHOUT CONTRAST TECHNIQUE: Multidetector CT imaging of the cervical spine was performed without intravenous contrast. Multiplanar CT image reconstructions were also generated. COMPARISON:  03/14/2019 FINDINGS: Alignment: No subluxation. Skull base and vertebrae: No acute fracture. No primary bone lesion or focal pathologic process. Soft tissues and spinal canal: No prevertebral fluid or swelling. No visible canal hematoma. Disc levels: Degenerative disc disease at C5-6 and C6-7 with disc space narrowing and spurring. Mild to moderate degenerative facet disease bilaterally, right greater than left. Upper chest: No acute findings Other: None IMPRESSION: No acute bony abnormality. Electronically Signed  By: Rolm Baptise M.D.   On: 10/18/2021 20:20   CT SHOULDER RIGHT WO CONTRAST  Result Date: 10/18/2021 CLINICAL DATA:  Proximal humeral fracture. EXAM: CT OF THE UPPER RIGHT EXTREMITY WITHOUT CONTRAST TECHNIQUE: Multidetector CT imaging of the upper right extremity was performed according to the standard protocol. COMPARISON:  Radiograph dated 10/18/2021. FINDINGS: Bones/Joint/Cartilage There is a displaced and comminuted fracture of the proximal right humerus involving the humeral neck and proximal humeral diaphysis. Multiple displaced fracture fragments noted. There is proximal migration of the humeral shaft in relation to the head of the humerus. There is lateral  positioning of the humeral diaphysis in relation to the head of the humerus. There is comminuted fractures of the humeral head. The bones are osteopenic. There is no dislocation. Ligaments Suboptimally assessed by CT. Muscles and Tendons There is intramuscular edema. No fluid collection or large hematoma. Soft tissues Diffuse subcutaneous edema of the right upper extremity and right axilla. No fluid collection or large hematoma. IMPRESSION: 1. Displaced and comminuted fracture of the proximal right humerus involving the humeral neck and proximal humeral diaphysis. 2. Comminuted fracture of the humeral head. Electronically Signed   By: Anner Crete M.D.   On: 10/18/2021 23:03   DG Chest Port 1 View  Result Date: 10/19/2021 CLINICAL DATA:  Central line placement EXAM: PORTABLE CHEST 1 VIEW COMPARISON:  10/18/2021 FINDINGS: Right central line tip in the SVC. No pneumothorax. Heart and mediastinal contours are within normal limits. No focal opacities or effusions. No acute bony abnormality. IMPRESSION: Right central line tip in the SVC.  No pneumothorax. No active cardiopulmonary disease. Electronically Signed   By: Rolm Baptise M.D.   On: 10/19/2021 01:20   DG Chest Portable 1 View  Result Date: 10/18/2021 CLINICAL DATA:  Shortness of breath.  Right upper arm bruising EXAM: PORTABLE CHEST 1 VIEW COMPARISON:  03/15/2019 FINDINGS: Heart size within normal limits. Atherosclerotic calcification of the aortic knob. No focal airspace consolidation, pleural effusion, or pneumothorax. Chronic left-sided rib fractures. Remote ununited left clavicular fracture. Acute comminuted, displaced, and angulated proximal right humeral fracture. IMPRESSION: 1. No active cardiopulmonary disease. 2. Acute comminuted, displaced, and angulated proximal right humeral fracture. Electronically Signed   By: Davina Poke D.O.   On: 10/18/2021 18:20   DG Humerus Right  Result Date: 10/18/2021 CLINICAL DATA:  Right upper extremity  bruising.  Found down. EXAM: RIGHT HUMERUS - 2+ VIEW COMPARISON:  None. FINDINGS: Acute comminuted fracture of the proximal right humerus with approximately 90 degrees of varus angulation. Dominant butterfly fragment measuring up to 7.5 cm which is medially displaced relative to the humeral shaft. Humeral head appears to be in articulation with the glenoid without evidence of dislocation. AC joint intact. Degenerative changes of the elbow. Prominent soft tissue swelling the fracture site. IMPRESSION: Acute comminuted, displaced, and angulated fracture of the proximal right humerus as described. Electronically Signed   By: Davina Poke D.O.   On: 10/18/2021 18:22    Positive ROS: All other systems have been reviewed and were otherwise negative with the exception of those mentioned in the HPI and as above.  Physical Exam: General: Asleep but arousable, no acute distress  MUSCULOSKELETAL: Right upper extremity: Patient has significant swelling and ecchymosis of the right upper extremity.  He has a varus deformity of the right upper extremity.  His skin is intact.  Compartments are soft and compressible.  He can flex and extend the fingers of his right hand.  His fingers well-perfused and  he is intact station light touch.  Patient has tenderness over the proximal right arm.  Assessment: Closed, comminuted displaced fracture of the right proximal humerus  Plan: I reviewed the patient's plain x-rays and CT scan.  Patient has a comminuted displaced fracture of the right proximal humerus status post fall at home.  Patient has a history of ETOH abuse.  Patient has an elevated CK today of 2341.  He has an elevated white count of 18.4.  Patient is in the ICU being treated for alcoholic ketoacidosis, mild rhabdomyolysis, acute on chronic kidney disease and suspected sepsis with severe metabolic acidosis.  Patient also has been noted to have prolonged QTs.    Patient will need to be medically stabilized before  surgery can be considered.  Patient significant swelling and ecchymosis in the right upper extremity which will also impact the timing of surgery.  Patient will continue use of his right shoulder sling.  Ice packs may be applied to the right upper extremity.  Patient has a complicated right proximal humerus fracture.  I have discussed this case with the orthopedic trauma specialist at [facility].  Dr. Doreatha Martin and Marcelino Scot have agreed to perform the patient's definitive surgery once he is cleared medically.  I will keep them updated on the patient's progress.  Transfer to [facility] can be arranged once the patient is medically stable for surgery.   Thornton Park, MD    10/19/2021 8:58 AM

## 2021-10-19 NOTE — Procedures (Signed)
Central Venous Catheter Insertion Procedure Note  JADIS MIKA  [id number]  12-Mar-1951  Date:10/19/21  Time:4:45 AM   Provider Performing:Janielle Mittelstadt L [place]   Procedure: Insertion of Non-tunneled Central Venous [phone]) with US guidance ([id number])   Indication(s) Medication administration  Consent Risks of the procedure as well as the alternatives and risks of each were explained to the patient and/or caregiver.  Consent for the procedure was obtained and is signed in the bedside chart  Anesthesia Topical only with 1% lidocaine  & fentanyl 25 mcg  Timeout Verified patient identification, verified procedure, site/side was marked, verified correct patient position, special equipment/implants available, medications/allergies/relevant history reviewed, required imaging and test results available.  Sterile Technique Maximal sterile technique including full sterile barrier drape, hand hygiene, sterile gown, sterile gloves, mask, hair covering, sterile ultrasound probe cover (if used).  Procedure Description Area of catheter insertion was cleaned with chlorhexidine and draped in sterile fashion.  With real-time ultrasound guidance a central venous catheter was placed into the right internal jugular vein. Nonpulsatile blood flow and easy flushing noted in all ports.  The catheter was sutured in place and sterile dressing applied.  Complications/Tolerance None; patient tolerated the procedure well. Chest X-ray is ordered to verify placement for internal jugular or subclavian cannulation.   Chest x-ray is not ordered for femoral cannulation.  EBL Minimal  Specimen(s) None   Venetia Night, AGACNP-BC Acute Care Nurse Practitioner [facility]   [phone] / [phone] Please see Amion for pager details.

## 2021-10-19 NOTE — Consult Note (Signed)
NAME: Edward Singleton  DOB: May 28, 1951  MRN: [id number]  Date/Time: 10/19/2021 1:47 PM  REQUESTING PROVIDER: Dr. Mortimer Fries Subjective:  REASON FOR CONSULT: Streptococcus bacteremia ? Edward Singleton is a 71 y.o. male with a history of hypertension, diabetes mellitus, chronic alcohol use presented to the ED via EMS after being found on the floor with significant bruising on his right arm. Patient states he fell after drinking and he does not remember how long he was on the floor.  Apparently police was called that they had to break down the door and found him on the floor with significant bruising lying on his right arm..  When EMS found him he was confused. In the ED BP 65/48, pulse rate 52, temperature was 90.5 and respiratory rate is 15. Had WBC of 24.3, Hb 11.3, platelet, sodium 134, creatinine 4.30, Patient was hypothermic and hence he received warm IV fluid and started on empiric antibiotics.  An x-ray of the shoulder revealed acute comminuted displaced and angulated fracture of the proximal right humerus.  Checks x-ray showed no acute cardiopulmonary disease.  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  Atrophy and chronic microvascular disease.  CT cervical spine no acute a abnormality.  CT abdomen pelvis showed prior splenectomy, small to moderate size hiatal hernia, fluid in the distal esophagus, sigmoid diverticulosis without any active diverticulitis. Patient states in 2016 he had a fall and broke his left elbow and has surgery.  Since then he has not been in the hospital. He does not remember what medications he takes but he states he takes them every day.  Blood cultures came back positive for Streptococcus species and I am asked to see the patient.  Patient also was found to have rhabdomyolysis. 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 ENDOSCOPY;  Service: Gastroenterology;  Laterality: Left;   left renal stent placement  2013   LITHOTRIPSY     ORIF HUMERUS FRACTURE Left 11/18/2014   Procedure: OPEN REDUCTION INTERNAL FIXATION (ORIF) DISTAL HUMERUS FRACTURE;  Surgeon: Rozanna Box, MD;  Location: [place];  Service: Orthopedics;  Laterality: Left;   SPLENECTOMY      Social History   Socioeconomic History   Marital status: Single    Spouse name: Not on file   Number of children: Not on file   Years of education: Not on file   Highest education level: Not on file  Occupational History   Not on file  Tobacco Use   Smoking status: Every Day    Packs/day: 1.00    Years: 50.00    Pack years: 50.00    Types: Cigarettes   Smokeless tobacco: Former    Types: Nurse, children's Use: Never used  Substance and Sexual Activity   Alcohol use: Not Currently    Alcohol/week: 42.0 standard drinks    Types: 42 Cans of beer per week    Comment: quit june 4th  Drug use: No   Sexual activity: Not on file  Other Topics Concern   Not on file  Social History Narrative   Not on file   Social Determinants of Health   Financial Resource Strain: Not on file  Food Insecurity: Not on file  Transportation Needs: Not on file  Physical Activity: Not on file  Stress: Not on file  Social Connections: Not on file  Intimate Partner Violence: Not on file    Family History  Problem Relation Age of Onset   Heart disease Mother    Heart disease Father    Diabetes Father    Nephrolithiasis Paternal Grandfather    Kidney disease Neg Hx    Prostate cancer Neg Hx    No Known Allergies I? Current  Facility-Administered Medications  Medication Dose Route Frequency Provider Last Rate Last Admin   0.9 %  sodium chloride infusion  250 mL Intravenous Continuous Duffy Bruce, MD   Stopped at 10/19/21 0149   Chlorhexidine Gluconate Cloth 2 % PADS 6 each  6 each Topical O1308 Flora Lipps, MD   6 each at 10/19/21 0519   docusate sodium (COLACE) capsule 100 mg  100 mg Oral BID PRN Rust-Chester, Huel Cote, NP       fentaNYL (SUBLIMAZE) injection 50 mcg  50 mcg Intravenous Q2H PRN Bradly Bienenstock, NP   50 mcg at 65/78/46 9629   folic acid (FOLVITE) tablet 1 mg  1 mg Oral Daily Rust-Chester, Huel Cote, NP   1 mg at 10/19/21 1005   heparin injection 5,000 Units  5,000 Units Subcutaneous Q8H Rust-Chester, Huel Cote, NP   5,000 Units at 10/19/21 1328   insulin aspart (novoLOG) injection 0-15 Units  0-15 Units Subcutaneous Q4H Rust-Chester, Huel Cote, NP   3 Units at 10/19/21 1159   LORazepam (ATIVAN) injection 0-4 mg  0-4 mg Intravenous Q4H Rust-Chester, Huel Cote, NP       Followed by   Derrill Memo ON 10/20/2021] LORazepam (ATIVAN) injection 0-4 mg  0-4 mg Intravenous Q8H Rust-Chester, Britton L, NP       LORazepam (ATIVAN) tablet 1-4 mg  1-4 mg Oral Q1H PRN Rust-Chester, Huel Cote, NP       Or   LORazepam (ATIVAN) injection 1-4 mg  1-4 mg Intravenous Q1H PRN Rust-Chester, Huel Cote, NP       multivitamin with minerals tablet 1 tablet  1 tablet Oral Daily Rust-Chester, Britton L, NP   1 tablet at 10/19/21 1002   norepinephrine (LEVOPHED) 4mg  in 269mL (0.016 mg/mL) premix infusion  2-10 mcg/min Intravenous Titrated Duffy Bruce, MD   Stopped at 10/19/21 0936   pantoprazole (PROTONIX) EC tablet 40 mg  40 mg Oral QHS Flora Lipps, MD       polyethylene glycol (MIRALAX / GLYCOLAX) packet 17 g  17 g Oral Daily PRN Rust-Chester, Britton L, NP       sodium bicarbonate 150 mEq in dextrose 5 % 1,150 mL infusion   Intravenous Continuous Rust-Chester, Britton L, NP 125 mL/hr at 10/19/21 1330 Infusion Verify at  10/19/21 1330   spiritus frumenti (ethyl alcohol) solution 1 each  1 each Oral TID Katha Hamming, MD   1 each at 10/19/21 1159   thiamine tablet 100 mg  100 mg Oral Daily Rust-Chester, Britton L, NP   100 mg at 10/19/21 1005   Or   thiamine (B-1) injection 100 mg  100 mg Intravenous Daily Rust-Chester, Huel Cote, NP  Abtx:  Anti-infectives (From admission, onward)    Start     Dose/Rate Route Frequency Ordered Stop   10/19/21 0700  vancomycin (VANCOCIN) IVPB 1000 mg/200 mL premix  Status:  Discontinued        1,000 mg 200 mL/hr over 60 Minutes Intravenous  Once 10/19/21 0606 10/19/21 0609   10/19/21 0700  vancomycin (VANCOREADY) IVPB 750 mg/150 mL        750 mg 150 mL/hr over 60 Minutes Intravenous  Once 10/19/21 0609 10/19/21 0729   10/18/21 1815  ceFEPIme (MAXIPIME) 2 g in sodium chloride 0.9 % 100 mL IVPB        2 g 200 mL/hr over 30 Minutes Intravenous  Once 10/18/21 1812 10/18/21 2023   10/18/21 1815  metroNIDAZOLE (FLAGYL) IVPB 500 mg        500 mg 100 mL/hr over 60 Minutes Intravenous  Once 10/18/21 1812 10/18/21 2225   10/18/21 1815  vancomycin (VANCOCIN) IVPB 1000 mg/200 mL premix        1,000 mg 200 mL/hr over 60 Minutes Intravenous  Once 10/18/21 1812 10/18/21 2326       REVIEW OF SYSTEMS: Patient is awake and alert now. Const: negative fever, negative chills, negative weight loss Eyes: negative diplopia or visual changes, negative eye pain ENT: negative coryza, negative sore throat Resp: negative cough, hemoptysis, dyspnea Cards: negative for chest pain, palpitations, lower extremity edema GU: negative for frequency, dysuria and hematuria GI: Negative for abdominal pain, diarrhea, bleeding, constipation Skin: Bruising on the right side Heme: negative for easy bruising and gum/nose bleeding MS: Severe pain right shoulder and right arm. CNS Patient is not able to remember how long he was on the floor some memory lapse. Endocrine: ,  diabetes Allergy/Immunology- negative for any medication or food allergies ? Objective:  VITALS:  BP 121/62    Pulse 82    Temp 99.5 F (37.5 C)    Resp 18    Ht 5\' 8"  (1.727 m)    Wt 72.6 kg    SpO2 97%    BMI 24.33 kg/m  PHYSICAL EXAM:  General: Alert, cooperative, in distress, oriented in place person year responding to questions appropriately Head: Normocephalic, without obvious abnormality, atraumatic. Eyes: Conjunctivae clear, anicteric sclerae. Pupils are equal ENT Nares normal. No drainage or sinus tenderness. Lips, mucosa, and tongue normal. No Thrush Poor dentition Neck: symmetrical, no adenopathy, thyroid: non tender no carotid bruit and no JVD. Right arm in collar Severe bruising of the right forearm and shoulder. Severe tenderness to touch   Lungs: Bilateral air entry.  Crypts in the bases Heart: Tachycardia Abdomen: Soft, non-tender,not distended. Bowel sounds normal. No masses Extremities: Lower extremity atraumatic, no cyanosis. No edema. No clubbing Skin: No rashes or lesions. Or bruising Lymph: Cervical, supraclavicular normal. Neurologic: Grossly non-focal Pertinent Labs Lab Results CBC    Component Value Date/Time   WBC 18.4 (H) 10/19/2021 0504   RBC 2.54 (L) 10/19/2021 0504   HGB 8.6 (L) 10/19/2021 0504   HGB 11.1 (L) 01/16/2014 0437   HCT 24.0 (L) 10/19/2021 0504   HCT 31.9 (L) 01/16/2014 0437   PLT 240 10/19/2021 0504   PLT 289 01/16/2014 0437   MCV 94.5 10/19/2021 0504   MCV 110 (H) 01/16/2014 0437   MCH 33.9 10/19/2021 0504   MCHC 35.8 10/19/2021 0504   RDW 14.3 10/19/2021 0504   RDW 14.1 01/16/2014 0437   LYMPHSABS 1.1 10/18/2021 1800   LYMPHSABS 4.4 (H) 01/16/2014 0437   MONOABS  2.3 (H) 10/18/2021 1800   MONOABS 1.3 (H) 01/16/2014 0437   EOSABS 0.0 10/18/2021 1800   EOSABS 0.2 01/16/2014 0437   BASOSABS 0.1 10/18/2021 1800   BASOSABS 0.1 01/16/2014 0437    CMP Latest Ref Rng & Units 10/19/2021 10/18/2021 10/18/2021  Glucose 70 - 99  mg/dL 214(H) 192(H) 170(H)  BUN 8 - 23 mg/dL 59(H) 59(H) 64(H)  Creatinine 0.61 - 1.24 mg/dL 3.32(H) 3.68(H) 4.30(H)  Sodium 135 - 145 mmol/L 130(L) 134(L) 134(L)  Potassium 3.5 - 5.1 mmol/L 2.8(L) 3.5 3.5  Chloride 98 - 111 mmol/L 92(L) 100 91(L)  CO2 22 - 32 mmol/L 23 10(L) 9(L)  Calcium 8.9 - 10.3 mg/dL 7.5(L) 6.7(L) 8.1(L)  Total Protein 6.5 - 8.1 g/dL 5.3(L) - 6.2(L)  Total Bilirubin 0.3 - 1.2 mg/dL 1.0 - 1.0  Alkaline Phos 38 - 126 U/L 83 - 100  AST 15 - 41 U/L 46(H) - 37  ALT 0 - 44 U/L 21 - 19      Microbiology: Recent Results (from the past 240 hour(s))  Blood culture (routine x 2)     Status: None (Preliminary result)   Collection Time: 10/18/21  6:24 PM   Specimen: BLOOD  Result Value Ref Range Status   Specimen Description   Final    BLOOD FOOT Performed at [facility], [address], [place], [place] [address]    Special Requests   Final    BOTTLES DRAWN AEROBIC AND ANAEROBIC BCAV Performed at [facility], [address], [place],  [address]    Culture  Setup Time   Final    Organism ID to follow IN BOTH AEROBIC AND ANAEROBIC BOTTLES GRAM POSITIVE COCCI CRITICAL RESULT CALLED TO, READ BACK BY AND VERIFIED WITH: NATHAN BEULE 10/19/21 0602 MW    Culture GRAM POSITIVE COCCI  Final   Report Status PENDING  Incomplete  Blood Culture ID Panel (Reflexed)     Status: Abnormal   Collection Time: 10/18/21  6:24 PM  Result Value Ref Range Status   Enterococcus faecalis NOT DETECTED NOT DETECTED Final   Enterococcus Faecium NOT DETECTED NOT DETECTED Final   Listeria monocytogenes NOT DETECTED NOT DETECTED Final   Staphylococcus species NOT DETECTED NOT DETECTED Final   Staphylococcus aureus (BCID) NOT DETECTED NOT DETECTED Final   Staphylococcus epidermidis NOT DETECTED NOT DETECTED Final   Staphylococcus lugdunensis NOT DETECTED NOT DETECTED Final   Streptococcus species DETECTED (A) NOT DETECTED Final    Comment: Not Enterococcus  species, Streptococcus agalactiae, Streptococcus pyogenes, or Streptococcus pneumoniae. CRITICAL RESULT CALLED TO, READ BACK BY AND VERIFIED WITH: NATHAN BEULE 10/19/21 0602 MW    Streptococcus agalactiae NOT DETECTED NOT DETECTED Final   Streptococcus pneumoniae NOT DETECTED NOT DETECTED Final   Streptococcus pyogenes NOT DETECTED NOT DETECTED Final   A.calcoaceticus-baumannii NOT DETECTED NOT DETECTED Final   Bacteroides fragilis NOT DETECTED NOT DETECTED Final   Enterobacterales NOT DETECTED NOT DETECTED Final   Enterobacter cloacae complex NOT DETECTED NOT DETECTED Final   Escherichia coli NOT DETECTED NOT DETECTED Final   Klebsiella aerogenes NOT DETECTED NOT DETECTED Final   Klebsiella oxytoca NOT DETECTED NOT DETECTED Final   Klebsiella pneumoniae NOT DETECTED NOT DETECTED Final   Proteus species NOT DETECTED NOT DETECTED Final   Salmonella species NOT DETECTED NOT DETECTED Final   Serratia marcescens NOT DETECTED NOT DETECTED Final   Haemophilus influenzae NOT DETECTED NOT DETECTED Final   Neisseria meningitidis NOT DETECTED NOT DETECTED Final   Pseudomonas aeruginosa  NOT DETECTED NOT DETECTED Final   Stenotrophomonas maltophilia NOT DETECTED NOT DETECTED Final   Candida albicans NOT DETECTED NOT DETECTED Final   Candida auris NOT DETECTED NOT DETECTED Final   Candida glabrata NOT DETECTED NOT DETECTED Final   Candida krusei NOT DETECTED NOT DETECTED Final   Candida parapsilosis NOT DETECTED NOT DETECTED Final   Candida tropicalis NOT DETECTED NOT DETECTED Final   Cryptococcus neoformans/gattii NOT DETECTED NOT DETECTED Final    Comment: Performed at [facility], [address] [place], [place] [address]  Resp Panel by RT-PCR (Flu A&B, Covid) Nasopharyngeal Swab     Status: None   Collection Time: 10/18/21  8:16 PM   Specimen: Nasopharyngeal Swab; Nasopharyngeal(NP) swabs in vial transport medium  Result Value Ref Range Status   SARS Coronavirus 2 by RT PCR  NEGATIVE NEGATIVE Final    Comment: (NOTE) SARS-CoV-2 target nucleic acids are NOT DETECTED.  The SARS-CoV-2 RNA is generally detectable in upper respiratory specimens during the acute phase of infection. The lowest concentration of SARS-CoV-2 viral copies this assay can detect is 138 copies/mL. A negative result does not preclude SARS-Cov-2 infection and should not be used as the sole basis for treatment or other patient management decisions. A negative result may occur with  improper specimen collection/handling, submission of specimen other than nasopharyngeal swab, presence of viral mutation(s) within the areas targeted by this assay, and inadequate number of viral copies(<138 copies/mL). A negative result must be combined with clinical observations, patient history, and epidemiological information. The expected result is Negative.  Fact Sheet for Patients:  EntrepreneurPulse.com.au  Fact Sheet for Healthcare Providers:  IncredibleEmployment.be  This test is no t yet approved or cleared by the [place] FDA and  has been authorized for detection and/or diagnosis of SARS-CoV-2 by FDA under an Emergency Use Authorization (EUA). This EUA will remain  in effect (meaning this test can be used) for the duration of the COVID-19 declaration under Section 564(b)(1) of the Act, 21 U.S.C.section 360bbb-3(b)(1), unless the authorization is terminated  or revoked sooner.       Influenza A by PCR NEGATIVE NEGATIVE Final   Influenza B by PCR NEGATIVE NEGATIVE Final    Comment: (NOTE) The Xpert Xpress SARS-CoV-2/FLU/RSV plus assay is intended as an aid in the diagnosis of influenza from Nasopharyngeal swab specimens and should not be used as a sole basis for treatment. Nasal washings and aspirates are unacceptable for Xpert Xpress SARS-CoV-2/FLU/RSV testing.  Fact Sheet for Patients: EntrepreneurPulse.com.au  Fact Sheet for  Healthcare Providers: IncredibleEmployment.be  This test is not yet approved or cleared by the [place] FDA and has been authorized for detection and/or diagnosis of SARS-CoV-2 by FDA under an Emergency Use Authorization (EUA). This EUA will remain in effect (meaning this test can be used) for the duration of the COVID-19 declaration under Section 564(b)(1) of the Act, 21 U.S.C. section 360bbb-3(b)(1), unless the authorization is terminated or revoked.  Performed at [facility], [address] [place], [place] [address]   Culture, blood (Routine X 2) w Reflex to ID Panel     Status: None (Preliminary result)   Collection Time: 10/18/21 11:28 PM   Specimen: BLOOD  Result Value Ref Range Status   Specimen Description BLOOD LEFT HAND  Final   Special Requests   Final    BOTTLES DRAWN AEROBIC ONLY Blood Culture adequate volume   Culture   Final    NO GROWTH < 12 HOURS Performed at [facility], [address] [place], [place] [address]    Report Status PENDING  Incomplete  MRSA Next Gen by PCR, Nasal     Status: None   Collection Time: 10/19/21  5:04 AM   Specimen: Nasal Mucosa; Nasal Swab  Result Value Ref Range Status   MRSA by PCR Next Gen NOT DETECTED NOT DETECTED Final    Comment: (NOTE) The GeneXpert MRSA Assay (FDA approved for NASAL specimens only), is one component of a comprehensive MRSA colonization surveillance program. It is not intended to diagnose MRSA infection nor to guide or monitor treatment for MRSA infections. Test performance is not FDA approved in patients less than 29 years old. Performed at [facility], [address] [place], [place] [address]     IMAGING RESULTS: I have personally reviewed the films ? Impression/Recommendation Fall following alcohol consumption. Complicated fracture of the right humerus Severe bruising/ecchymosis of the right arm and shoulder with severe pain.  Watch for  secondary infection.  Circulatory shock secondary to alcoholic ketosis, hypothermia and infection Received pressure support but now stable  Streptococcus bacteremia CT scan done on 10/18/2021 showed displaced and comminuted fracture of the proximal right humerus and comminuted fracture of the humeral head.  Diffuse subcutaneous edema of the right upper extremity and right axilla.  No fluid collection or large hematoma. Watch closely for development of any infection including abscess necrosis. Patient currently cefepime can be switched over to ceftriaxone.  History of splenectomy.  Rhabdomyolysis with increased CPK.  Getting fluids   AKI on CKD.  Secondary to rhabdo, prerenal and infection Improving Nephrology on board   Diabetes mellitus on sliding scale  ? ___________________________________________________ Discussed with patient, requesting provider Note:  This document was prepared using Dragon voice recognition software and may include unintentional dictation errors.

## 2021-10-19 NOTE — Progress Notes (Signed)
Pharmacy Antibiotic Note  Edward Singleton is a 71 y.o. male admitted on 10/18/2021 with right proximal humerus fracture. Patient was found down in home after being down for unknown length of time. He is here with AKI and rhabdomyolysis, but appears to be improving since admission. Plan is for surgery at [facility] once medically stable. Blood cultures drawn on admission with 1/2 sets strep species, which is most likely a contaminant. Pharmacy has been consulted for cefepime dosing.  Plan: Discontinue vancomycin  Continue cefepime at 2 g IV q24h based on current renal function  Height: 5\' 8"  (172.7 cm) Weight: 72.6 kg (160 lb) IBW/kg (Calculated) : 68.4  Temp (24hrs), Avg:98.1 F (36.7 C), Min:90.4 F (32.4 C), Max:100.6 F (38.1 C)  Recent Labs  Lab 10/18/21 1800 10/18/21 1824 10/18/21 1950 10/18/21 2016 10/19/21 0125 10/19/21 0504  WBC 24.3*  --   --   --   --  18.4*  CREATININE 4.30*  --   --  3.68*  --  3.32*  LATICACIDVEN  --  8.2* 5.3*  --  2.1*  --     Estimated Creatinine Clearance: 20.3 mL/min (A) (by C-G formula based on SCr of 3.32 mg/dL (H)).    No Known Allergies  Antimicrobials this admission: Cefepime 1/9 >> Vancomycin 1/9 >> 1/10  Microbiology results: 1/9 BCx: 1/2 sets strep species 1/10 MRSA PCR: negative  Thank you for allowing pharmacy to be a part of this patients care.  Tawnya Crook, PharmD, BCPS Clinical Pharmacist 10/19/2021 10:59 AM

## 2021-10-19 NOTE — Progress Notes (Signed)
*  PRELIMINARY RESULTS* Echocardiogram 2D Echocardiogram has been performed.  Edward Singleton 10/19/2021, 1:59 PM

## 2021-10-19 NOTE — Progress Notes (Signed)
[facility]  ROUNDING NOTE   Subjective:  Patient well-known to [place] from prior admission in 2020. Patient with known history of chronic alcohol abuse. Patient came in status post fall and it appears that he was on the ground for prolonged period of time however this timeline is unclear to the patient. Patient is a poor historian. Imaging showed acute comminuted and displaced and angulated fracture of the proximal right humerus. Patient with evidence for rhabdomyolysis. He is currently on sodium bicarb and drip. Also appears to have associated hyponatremia as well as hypokalemia. Patient also has acute kidney injury with creatinine of 2.56 at the moment.  Objective:  Vital signs in last 24 hours:  Temp:  [95.6 F (35.3 C)-100.6 F (38.1 C)] 100 F (37.8 C) (01/10 1900) Pulse Rate:  [76-107] 82 (01/10 1900) Resp:  [10-24] 23 (01/10 1900) BP: (78-172)/(42-124) 131/69 (01/10 1800) SpO2:  [92 %-100 %] 92 % (01/10 1900)  Weight change:  Filed Weights   10/18/21 1839  Weight: 72.6 kg    Intake/Output: I/O last 3 completed shifts: In: 7460.1 [P.O.:960; I.V.:2780; IV Piggyback:3720.1] Out: 3240 [Urine:3240]   Intake/Output this shift:  No intake/output data recorded.  Physical Exam: General: No acute distress  Head: Normocephalic, atraumatic. Moist oral mucosal membranes  Eyes: Anicteric  Neck: Supple  Lungs:  Clear to auscultation, normal effort  Heart: S1S2 no rubs  Abdomen:  Soft, nontender, bowel sounds present  Extremities: Right shoulder bruising noted  Neurologic: Awake, but confused, will follow simple commands  Skin: No acute rash  Access: No hemodialysis access    Basic Metabolic Panel: Recent Labs  Lab 10/18/21 1800 10/18/21 2016 10/19/21 0504 10/19/21 1402  NA 134* 134* 130* 133*  K 3.5 3.5 2.8* 3.1*   3.1*  CL 91* 100 92* 94*  CO2 9* 10* 23 31  GLUCOSE 170* 192* 214* 122*  BUN 64* 59* 59* 48*  CREATININE 4.30* 3.68* 3.32* 2.56*   CALCIUM 8.1* 6.7* 7.5* 7.5*  MG 2.6*  --  1.7  --   PHOS  --  7.6* 4.9*  --     Liver Function Tests: Recent Labs  Lab 10/18/21 1800 10/19/21 0504  AST 37 46*  ALT 19 21  ALKPHOS 100 83  BILITOT 1.0 1.0  PROT 6.2* 5.3*  ALBUMIN 3.4* 3.0*   Recent Labs  Lab 10/18/21 1800 10/19/21 0504  LIPASE 57* 112*   No results for input(s): AMMONIA in the last 168 hours.  CBC: Recent Labs  Lab 10/18/21 1800 10/19/21 0504  WBC 24.3* 18.4*  NEUTROABS 20.4*  --   HGB 11.3* 8.6*  HCT 34.4* 24.0*  MCV 104.9* 94.5  PLT 275 240    Cardiac Enzymes: Recent Labs  Lab 10/18/21 1800 10/19/21 0504  CKTOTAL 990* 2,341*    BNP: Invalid input(s): POCBNP  CBG: Recent Labs  Lab 10/19/21 0421 10/19/21 0731 10/19/21 1141 10/19/21 1604 10/19/21 1955  GLUCAP 207* 200* 155* 154* 68*    Microbiology: Results for orders placed or performed during the hospital encounter of 10/18/21  Blood culture (routine x 2)     Status: None (Preliminary result)   Collection Time: 10/18/21  6:24 PM   Specimen: BLOOD  Result Value Ref Range Status   Specimen Description   Final    BLOOD FOOT Performed at [facility], [address], [place], [place] [address]    Special Requests   Final    BOTTLES DRAWN AEROBIC AND ANAEROBIC BCAV Performed at [facility]  [facility], [address] [place], [place] [address]    Culture  Setup Time   Final    Organism ID to follow IN BOTH AEROBIC AND ANAEROBIC BOTTLES GRAM POSITIVE COCCI CRITICAL RESULT CALLED TO, READ BACK BY AND VERIFIED WITH: NATHAN BEULE 10/19/21 0602 MW    Culture GRAM POSITIVE COCCI  Final   Report Status PENDING  Incomplete  Blood Culture ID Panel (Reflexed)     Status: Abnormal   Collection Time: 10/18/21  6:24 PM  Result Value Ref Range Status   Enterococcus faecalis NOT DETECTED NOT DETECTED Final   Enterococcus Faecium NOT DETECTED NOT DETECTED Final   Listeria monocytogenes NOT DETECTED NOT DETECTED Final    Staphylococcus species NOT DETECTED NOT DETECTED Final   Staphylococcus aureus (BCID) NOT DETECTED NOT DETECTED Final   Staphylococcus epidermidis NOT DETECTED NOT DETECTED Final   Staphylococcus lugdunensis NOT DETECTED NOT DETECTED Final   Streptococcus species DETECTED (A) NOT DETECTED Final    Comment: Not Enterococcus species, Streptococcus agalactiae, Streptococcus pyogenes, or Streptococcus pneumoniae. CRITICAL RESULT CALLED TO, READ BACK BY AND VERIFIED WITH: NATHAN BEULE 10/19/21 0602 MW    Streptococcus agalactiae NOT DETECTED NOT DETECTED Final   Streptococcus pneumoniae NOT DETECTED NOT DETECTED Final   Streptococcus pyogenes NOT DETECTED NOT DETECTED Final   A.calcoaceticus-baumannii NOT DETECTED NOT DETECTED Final   Bacteroides fragilis NOT DETECTED NOT DETECTED Final   Enterobacterales NOT DETECTED NOT DETECTED Final   Enterobacter cloacae complex NOT DETECTED NOT DETECTED Final   Escherichia coli NOT DETECTED NOT DETECTED Final   Klebsiella aerogenes NOT DETECTED NOT DETECTED Final   Klebsiella oxytoca NOT DETECTED NOT DETECTED Final   Klebsiella pneumoniae NOT DETECTED NOT DETECTED Final   Proteus species NOT DETECTED NOT DETECTED Final   Salmonella species NOT DETECTED NOT DETECTED Final   Serratia marcescens NOT DETECTED NOT DETECTED Final   Haemophilus influenzae NOT DETECTED NOT DETECTED Final   Neisseria meningitidis NOT DETECTED NOT DETECTED Final   Pseudomonas aeruginosa NOT DETECTED NOT DETECTED Final   Stenotrophomonas maltophilia NOT DETECTED NOT DETECTED Final   Candida albicans NOT DETECTED NOT DETECTED Final   Candida auris NOT DETECTED NOT DETECTED Final   Candida glabrata NOT DETECTED NOT DETECTED Final   Candida krusei NOT DETECTED NOT DETECTED Final   Candida parapsilosis NOT DETECTED NOT DETECTED Final   Candida tropicalis NOT DETECTED NOT DETECTED Final   Cryptococcus neoformans/gattii NOT DETECTED NOT DETECTED Final    Comment: Performed at  [facility], [address] [place], [place] [address]  Resp Panel by RT-PCR (Flu A&B, Covid) Nasopharyngeal Swab     Status: None   Collection Time: 10/18/21  8:16 PM   Specimen: Nasopharyngeal Swab; Nasopharyngeal(NP) swabs in vial transport medium  Result Value Ref Range Status   SARS Coronavirus 2 by RT PCR NEGATIVE NEGATIVE Final    Comment: (NOTE) SARS-CoV-2 target nucleic acids are NOT DETECTED.  The SARS-CoV-2 RNA is generally detectable in upper respiratory specimens during the acute phase of infection. The lowest concentration of SARS-CoV-2 viral copies this assay can detect is 138 copies/mL. A negative result does not preclude SARS-Cov-2 infection and should not be used as the sole basis for treatment or other patient management decisions. A negative result may occur with  improper specimen collection/handling, submission of specimen other than nasopharyngeal swab, presence of viral mutation(s) within the areas targeted by this assay, and inadequate number of viral copies(<138 copies/mL). A negative result must be combined with clinical observations,  patient history, and epidemiological information. The expected result is Negative.  Fact Sheet for Patients:  EntrepreneurPulse.com.au  Fact Sheet for Healthcare Providers:  IncredibleEmployment.be  This test is no t yet approved or cleared by the [place] FDA and  has been authorized for detection and/or diagnosis of SARS-CoV-2 by FDA under an Emergency Use Authorization (EUA). This EUA will remain  in effect (meaning this test can be used) for the duration of the COVID-19 declaration under Section 564(b)(1) of the Act, 21 U.S.C.section 360bbb-3(b)(1), unless the authorization is terminated  or revoked sooner.       Influenza A by PCR NEGATIVE NEGATIVE Final   Influenza B by PCR NEGATIVE NEGATIVE Final    Comment: (NOTE) The Xpert Xpress SARS-CoV-2/FLU/RSV plus assay  is intended as an aid in the diagnosis of influenza from Nasopharyngeal swab specimens and should not be used as a sole basis for treatment. Nasal washings and aspirates are unacceptable for Xpert Xpress SARS-CoV-2/FLU/RSV testing.  Fact Sheet for Patients: EntrepreneurPulse.com.au  Fact Sheet for Healthcare Providers: IncredibleEmployment.be  This test is not yet approved or cleared by the [place] FDA and has been authorized for detection and/or diagnosis of SARS-CoV-2 by FDA under an Emergency Use Authorization (EUA). This EUA will remain in effect (meaning this test can be used) for the duration of the COVID-19 declaration under Section 564(b)(1) of the Act, 21 U.S.C. section 360bbb-3(b)(1), unless the authorization is terminated or revoked.  Performed at [facility], [address] [place], [place] [address]   Culture, blood (Routine X 2) w Reflex to ID Panel     Status: None (Preliminary result)   Collection Time: 10/18/21 11:28 PM   Specimen: BLOOD  Result Value Ref Range Status   Specimen Description BLOOD LEFT HAND  Final   Special Requests   Final    BOTTLES DRAWN AEROBIC ONLY Blood Culture adequate volume   Culture   Final    NO GROWTH < 12 HOURS Performed at [facility], [name] [place], [place] [address]    Report Status PENDING  Incomplete  MRSA Next Gen by PCR, Nasal     Status: None   Collection Time: 10/19/21  5:04 AM   Specimen: Nasal Mucosa; Nasal Swab  Result Value Ref Range Status   MRSA by PCR Next Gen NOT DETECTED NOT DETECTED Final    Comment: (NOTE) The GeneXpert MRSA Assay (FDA approved for NASAL specimens only), is one component of a comprehensive MRSA colonization surveillance program. It is not intended to diagnose MRSA infection nor to guide or monitor treatment for MRSA infections. Test performance is not FDA approved in patients less than 60 years old. Performed at  [facility], [address] [place], [place] [address]     Coagulation Studies: No results for input(s): LABPROT, INR in the last 72 hours.  Urinalysis: Recent Labs    10/18/21 1755  COLORURINE YELLOW*  LABSPEC 1.012  PHURINE 5.0  GLUCOSEU NEGATIVE  HGBUR MODERATE*  BILIRUBINUR NEGATIVE  KETONESUR NEGATIVE  PROTEINUR 100*  NITRITE NEGATIVE  LEUKOCYTESUR NEGATIVE      Imaging: CT ABDOMEN PELVIS WO CONTRAST  Result Date: 10/18/2021 CLINICAL DATA:  Abdominal pain, acute, nonlocalized. Found on floor. EXAM: CT ABDOMEN AND PELVIS WITHOUT CONTRAST TECHNIQUE: Multidetector CT imaging of the abdomen and pelvis was performed following the standard protocol without IV contrast. COMPARISON:  05/11/2015 FINDINGS: Lower chest: Small to moderate-sized hiatal hernia. Fluid in the distal esophagus may reflect reflux. No effusions.  Coronary artery and aortic calcifications. Hepatobiliary: No focal hepatic abnormality. Gallbladder unremarkable. Pancreas: No focal abnormality or ductal dilatation. Spleen: Prior splenectomy Adrenals/Urinary Tract: Left kidney is atrophic. 5.3 cm cyst off the midpole of the left kidney. Bilateral nephrolithiasis. No hydronephrosis. Small scattered low-density lesions in the right kidney likely reflects cysts. Adrenal glands unremarkable. Foley catheter in the bladder which is decompressed. Stomach/Bowel: Sigmoid diverticulosis. No active diverticulitis. Mild distention of the stomach. Small bowel is decompressed. Vascular/Lymphatic: Heavily calcified aorta and branch vessels. No aneurysm or adenopathy. Reproductive: No visible focal abnormality. Other: No free fluid or free air. Musculoskeletal: Old healed fractures of the left superior and inferior pubic rami. No acute bony abnormality. IMPRESSION: Bilateral nephrolithiasis.  No hydronephrosis. Small to moderate-sized hiatal hernia. Fluid in the distal esophagus may be related to reflux or dysmotility. Sigmoid  diverticulosis.  No active diverticulitis. Heavily calcified aorta. Electronically Signed   By: Rolm Baptise M.D.   On: 10/18/2021 20:26   CT HEAD WO CONTRAST (5MM)  Result Date: 10/18/2021 CLINICAL DATA:  Head trauma, minor (Age >= 65y).  Found on floor. EXAM: CT HEAD WITHOUT CONTRAST TECHNIQUE: Contiguous axial images were obtained from the base of the skull through the vertex without intravenous contrast. 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 There is atrophy and chronic small vessel disease changes. No acute intracranial abnormality. Specifically, no hemorrhage, hydrocephalus, mass lesion, acute infarction, or significant intracranial injury. Vascular: No hyperdense vessel or unexpected calcification. Skull: No acute calvarial abnormality. 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 Atrophy, chronic microvascular disease. No acute intracranial abnormality. Electronically Signed   By: Rolm Baptise M.D.   On: 10/18/2021 20:18   CT Cervical Spine Wo Contrast  Result Date: 10/18/2021 CLINICAL DATA:  Neck trauma (Age >= 65y).  Found on floor. EXAM: CT CERVICAL SPINE WITHOUT CONTRAST TECHNIQUE: Multidetector CT imaging of the cervical spine was performed without intravenous contrast. Multiplanar CT image reconstructions were also generated. COMPARISON:  03/14/2019 FINDINGS: Alignment: No subluxation. Skull base and vertebrae: No acute fracture. No primary bone lesion or focal pathologic process. Soft tissues and spinal canal: No prevertebral fluid or swelling. No visible canal hematoma. Disc levels: Degenerative disc disease at C5-6 and C6-7 with disc space narrowing and spurring. Mild to moderate degenerative facet disease bilaterally, right greater than left. Upper chest: No acute findings Other: None IMPRESSION: No acute bony abnormality. Electronically Signed   By: Rolm Baptise M.D.   On:  10/18/2021 20:20   CT SHOULDER RIGHT WO CONTRAST  Result Date: 10/18/2021 CLINICAL DATA:  Proximal humeral fracture. EXAM: CT OF THE UPPER RIGHT EXTREMITY WITHOUT CONTRAST TECHNIQUE: Multidetector CT imaging of the upper right extremity was performed according to the standard protocol. COMPARISON:  Radiograph dated 10/18/2021. FINDINGS: Bones/Joint/Cartilage There is a displaced and comminuted fracture of the proximal right humerus involving the humeral neck and proximal humeral diaphysis. Multiple displaced fracture fragments noted. There is proximal migration of the humeral shaft in relation to the head of the humerus. There is lateral positioning of the humeral diaphysis in relation to the head of the humerus. There is comminuted fractures of the humeral head. The bones are osteopenic. There is no dislocation. Ligaments Suboptimally assessed by CT. Muscles and Tendons There is intramuscular edema. No fluid collection or large hematoma. Soft tissues Diffuse subcutaneous edema of the right upper extremity and right axilla. No fluid collection or large hematoma. IMPRESSION: 1. Displaced and  comminuted fracture of the proximal right humerus involving the humeral neck and proximal humeral diaphysis. 2. Comminuted fracture of the humeral head. Electronically Signed   By: Anner Crete M.D.   On: 10/18/2021 23:03   DG Chest Port 1 View  Result Date: 10/19/2021 CLINICAL DATA:  Central line placement EXAM: PORTABLE CHEST 1 VIEW COMPARISON:  10/18/2021 FINDINGS: Right central line tip in the SVC. No pneumothorax. Heart and mediastinal contours are within normal limits. No focal opacities or effusions. No acute bony abnormality. IMPRESSION: Right central line tip in the SVC.  No pneumothorax. No active cardiopulmonary disease. Electronically Signed   By: Rolm Baptise M.D.   On: 10/19/2021 01:20   DG Chest Portable 1 View  Result Date: 10/18/2021 CLINICAL DATA:  Shortness of breath.  Right upper arm bruising  EXAM: PORTABLE CHEST 1 VIEW COMPARISON:  03/15/2019 FINDINGS: Heart size within normal limits. Atherosclerotic calcification of the aortic knob. No focal airspace consolidation, pleural effusion, or pneumothorax. Chronic left-sided rib fractures. Remote ununited left clavicular fracture. Acute comminuted, displaced, and angulated proximal right humeral fracture. IMPRESSION: 1. No active cardiopulmonary disease. 2. Acute comminuted, displaced, and angulated proximal right humeral fracture. Electronically Signed   By: Davina Poke D.O.   On: 10/18/2021 18:20   DG Humerus Right  Result Date: 10/18/2021 CLINICAL DATA:  Right upper extremity bruising.  Found down. EXAM: RIGHT HUMERUS - 2+ VIEW COMPARISON:  None. FINDINGS: Acute comminuted fracture of the proximal right humerus with approximately 90 degrees of varus angulation. Dominant butterfly fragment measuring up to 7.5 cm which is medially displaced relative to the humeral shaft. Humeral head appears to be in articulation with the glenoid without evidence of dislocation. AC joint intact. Degenerative changes of the elbow. Prominent soft tissue swelling the fracture site. IMPRESSION: Acute comminuted, displaced, and angulated fracture of the proximal right humerus as described. Electronically Signed   By: Davina Poke D.O.   On: 10/18/2021 18:22   ECHOCARDIOGRAM COMPLETE  Result Date: 10/19/2021    ECHOCARDIOGRAM REPORT   Patient Name:   Edward Singleton Date of Exam: 10/19/2021 Medical Rec #:  [id number]       Height:       68.0 in Accession #:    [id number]      Weight:       160.0 lb Date of Birth:  1951-04-04        BSA:          1.859 m Patient Age:    26 years        BP:           121/62 mmHg Patient Gender: M               HR:           80 bpm. Exam Location:  [facility] Procedure: 2D Echo, Color Doppler and Cardiac Doppler Indications:     I42.9 Cardiomyopathy-unspecified  History:         Patient has no prior history of Echocardiogram examinations.                   Risk Factors:Hypertension, Diabetes, HCL and Current Smoker. Hx                  of alcohol abuse.  Sonographer:     Charmayne Sheer Referring Phys:  [id number] BRITTON L R[place]CHESTER Diagnosing Phys: Harrell Gave End MD  Sonographer Comments: Technically difficult study due to poor echo windows and no apical window. IMPRESSIONS  1. Left ventricular ejection fraction, by estimation, is >55%. The left ventricle has normal function. Left ventricular endocardial border not optimally defined to evaluate regional wall motion. Left ventricular diastolic parameters are indeterminate.  2. Right ventricular systolic function is normal. The right ventricular size is normal. Tricuspid regurgitation signal is inadequate for assessing PA pressure.  3. The mitral valve was not well visualized. Unable to accurately assess mitral valve regurgitation.  4. The aortic valve is tricuspid. There is mild calcification of the aortic valve. There is mild thickening of the aortic valve. Aortic valve regurgitation not well assessed. Aortic valve gradient could not be assessed.  5. The inferior vena cava is normal in size with <50% respiratory variability, suggesting right atrial pressure of 8 mmHg. FINDINGS  Left Ventricle: Left ventricular ejection fraction, by estimation, is >55%. The left ventricle has normal function. Left ventricular endocardial border not optimally defined to evaluate regional wall motion. The left ventricular internal cavity size was  normal in size. There is no left ventricular hypertrophy. Left ventricular diastolic parameters are indeterminate. Right Ventricle: The right ventricular size is normal. No increase in right ventricular wall thickness. Right ventricular systolic function is normal. Tricuspid regurgitation signal is inadequate for assessing PA pressure. Left Atrium: Left atrial size was not well visualized. Right Atrium: Right atrial size was not well visualized. Pericardium: There is no evidence of  pericardial effusion. Mitral Valve: The mitral valve was not well visualized. Mild mitral annular calcification. Unable to accurately assess mitral valve regurgitation. Tricuspid Valve: The tricuspid valve is not well visualized. Tricuspid valve regurgitation is trivial. Aortic Valve: The aortic valve is tricuspid. There is mild calcification of the aortic valve. There is mild thickening of the aortic valve. Aortic valve regurgitation not well assessed. Aortic valve gradient could not be assessed. Pulmonic Valve: The pulmonic valve was not well visualized. Pulmonic valve regurgitation is not visualized. No evidence of pulmonic stenosis. Aorta: The aortic root is normal in size and structure. Pulmonary Artery: The pulmonary artery is not well seen. Venous: The inferior vena cava is normal in size with less than 50% respiratory variability, suggesting right atrial pressure of 8 mmHg. IAS/Shunts: No atrial level shunt detected by color flow Doppler.  LEFT VENTRICLE PLAX 2D LVIDd:         3.83 cm LVIDs:         2.75 cm LV PW:         0.88 cm LV IVS:        0.73 cm LVOT diam:     2.40 cm LVOT Area:     4.52 cm  LEFT ATRIUM         Index LA diam:    3.80 cm 2.04 cm/m                        PULMONIC VALVE AORTA                 PV Vmax:       1.09 m/s Ao Root diam: 3.70 cm PV Vmean:      72.300 cm/s                       PV VTI:        0.185 m                       PV Peak grad:  4.8 mmHg  PV Mean grad:  2.0 mmHg   SHUNTS Systemic Diam: 2.40 cm Nelva Bush MD Electronically signed by Nelva Bush MD Signature Date/Time: 10/19/2021/3:40:12 PM    Final      Medications:    sodium chloride Stopped (10/19/21 0149)   ceFEPime (MAXIPIME) IV 2 g (10/19/21 2019)   norepinephrine (LEVOPHED) Adult infusion Stopped (10/19/21 0936)    Chlorhexidine Gluconate Cloth  6 each Topical Y6060   folic acid  1 mg Oral Daily   heparin  5,000 Units Subcutaneous Q8H   insulin aspart  0-15 Units Subcutaneous  Q4H   LORazepam  0-4 mg Intravenous Q4H   Followed by   Derrill Memo ON 10/20/2021] LORazepam  0-4 mg Intravenous Q8H   multivitamin with minerals  1 tablet Oral Daily   pantoprazole  40 mg Oral QHS   spiritus frumenti  1 each Oral TID AC   thiamine  100 mg Oral Daily   Or   thiamine  100 mg Intravenous Daily   docusate sodium, fentaNYL (SUBLIMAZE) injection, LORazepam **OR** LORazepam, polyethylene glycol  Assessment/ Plan:  71 y.o. male with past medical history of alcohol abuse, atrophic left kidney, chronic kidney disease stage IV with baseline EGFR of of 26, alcohol induced dementia, diabetes mellitus type 2, gout, hypertension, hypercholesterolemia, history of pelvic fracture 2/16 admitted now status post fall with evidence of acute kidney injury, rhabdomyolysis, right shoulder fracture, hypokalemia, and hyponatremia.  1.  Acute kidney injury/chronic kidney disease stage IV baseline EGFR 26/atrophic left kidney/rhabdomyolysis.  Patient with known atrophic left kidney.  He has significant acute kidney injury now however this does appear to be improving.  No immediate need for dialysis.  CT scan abdomen and pelvis was performed and showed atrophic left kidney but no hydronephrosis.  Patient was previously on sodium bicarbonate drip.  We will resume this.  2.  Hyponatremia/hypokalemia.  Continue sodium bicarb and drip and provide potassium repletion as necessary.  3.  Thanks for consultation.     LOS: 1 Edward Singleton 1/10/20238:26 PM

## 2021-10-19 NOTE — Progress Notes (Signed)
PHARMACY - PHYSICIAN COMMUNICATION CRITICAL VALUE ALERT - BLOOD CULTURE IDENTIFICATION (BCID)  Edward Singleton is an 71 y.o. male who presented to [facility] on 10/18/2021 with a right proximal humerus fracture. Patient was found down in home after being down for unknown length of time.  Assessment:  1/2 sets strep species, possible contaminant  Name of physician (or Provider) Contacted: Dr. Mortimer Fries  Current antibiotics: vancomycin, cefepime  Changes to prescribed antibiotics recommended: discontinue vancomycin, continue cefepime for now  Results for orders placed or performed during the hospital encounter of 10/18/21  Blood Culture ID Panel (Reflexed) (Collected: 10/18/2021  6:24 PM)  Result Value Ref Range   Enterococcus faecalis NOT DETECTED NOT DETECTED   Enterococcus Faecium NOT DETECTED NOT DETECTED   Listeria monocytogenes NOT DETECTED NOT DETECTED   Staphylococcus species NOT DETECTED NOT DETECTED   Staphylococcus aureus (BCID) NOT DETECTED NOT DETECTED   Staphylococcus epidermidis NOT DETECTED NOT DETECTED   Staphylococcus lugdunensis NOT DETECTED NOT DETECTED   Streptococcus species DETECTED (A) NOT DETECTED   Streptococcus agalactiae NOT DETECTED NOT DETECTED   Streptococcus pneumoniae NOT DETECTED NOT DETECTED   Streptococcus pyogenes NOT DETECTED NOT DETECTED   A.calcoaceticus-baumannii NOT DETECTED NOT DETECTED   Bacteroides fragilis NOT DETECTED NOT DETECTED   Enterobacterales NOT DETECTED NOT DETECTED   Enterobacter cloacae complex NOT DETECTED NOT DETECTED   Escherichia coli NOT DETECTED NOT DETECTED   Klebsiella aerogenes NOT DETECTED NOT DETECTED   Klebsiella oxytoca NOT DETECTED NOT DETECTED   Klebsiella pneumoniae NOT DETECTED NOT DETECTED   Proteus species NOT DETECTED NOT DETECTED   Salmonella species NOT DETECTED NOT DETECTED   Serratia marcescens NOT DETECTED NOT DETECTED   Haemophilus influenzae NOT DETECTED NOT DETECTED   Neisseria meningitidis NOT DETECTED  NOT DETECTED   Pseudomonas aeruginosa NOT DETECTED NOT DETECTED   Stenotrophomonas maltophilia NOT DETECTED NOT DETECTED   Candida albicans NOT DETECTED NOT DETECTED   Candida auris NOT DETECTED NOT DETECTED   Candida glabrata NOT DETECTED NOT DETECTED   Candida krusei NOT DETECTED NOT DETECTED   Candida parapsilosis NOT DETECTED NOT DETECTED   Candida tropicalis NOT DETECTED NOT DETECTED   Cryptococcus neoformans/gattii NOT DETECTED NOT DETECTED    Tawnya Crook, PharmD, BCPS Clinical Pharmacist 10/19/2021 10:53 AM

## 2021-10-19 NOTE — Sepsis Progress Note (Signed)
Appropriate Fluids//Abx's given with NP ordering additional Lactic after C-line placement

## 2021-10-19 NOTE — Consult Note (Addendum)
PHARMACY CONSULT NOTE  Pharmacy Consult for Electrolyte Monitoring and Replacement   Recent Labs: Potassium (mmol/L)  Date Value  10/19/2021 2.8 (L)  01/16/2014 5.0   Magnesium (mg/dL)  Date Value  10/19/2021 1.7  01/15/2014 1.2 (L)   Calcium (mg/dL)  Date Value  10/19/2021 7.5 (L)   Calcium, Total (mg/dL)  Date Value  01/16/2014 8.5   Albumin (g/dL)  Date Value  10/19/2021 3.0 (L)  10/16/2015 4.7   Phosphorus (mg/dL)  Date Value  10/19/2021 4.9 (H)   Sodium (mmol/L)  Date Value  10/19/2021 130 (L)  08/11/2015 136 (A)  01/16/2014 139   Assessment: Patient is a 71 y/o M with medical history including EtOH use disorder, BPH, gout, CKD, HTN, CAD, PAD, HLD, diabetes who was BIBEMS from home on 1/9 after being found down for unknown period of time. Patient with right proximal humerus fracture, plan is for transfer to [facility] for surgery once medically cleared. He is currently in the ICU with AKI and rhabdomyolysis. Pharmacy consulted to assist with electrolyte monitoring and replacement as indicated.  MIVF: Isotonic bicarbonate gtt at 125 cc/hr  Goal of Therapy:  Electrolytes within normal limits  Plan:  --Potassium 40 mEq PO + 10 mEq IV x 2 --Magnesium 2 g IV --Repeat potassium 3.1 at 1400 - order for an additional 40 mEq PO x 1 --Follow up with morning labs  Tawnya Crook, PharmD, BCPS Clinical Pharmacist 10/19/2021 11:03 AM

## 2021-10-19 NOTE — TOC Initial Note (Signed)
Transition of Care [facility]) - Initial/Assessment Note    Patient Details  Name: Edward Singleton MRN: [id number] Date of Birth: 1951-03-26  [facility]) CM/SW Contact:    Kerin Salen, RN Phone Number: 10/19/2021, 3:18 PM  Clinical Narrative: Unable to speak with patient, admitted for ETOH abuse and humerus fracture. Patient to transfer to [facility] for surgery, TOC will track and assist as needed.                        Patient Goals and CMS Choice        Expected Discharge Plan and Services                                                Prior Living Arrangements/Services                       Activities of Daily Living      Permission Sought/Granted                  Emotional Assessment              Admission diagnosis:  Rhabdomyolysis [M62.82] Patient Active Problem List   Diagnosis Date Noted   Metabolic acidosis    Shock ([place])    Rhabdomyolysis 10/18/2021   Delayed emergence from anesthesia 03/18/2019   Hypokalemia 92/08/9416   Alcoholic ketoacidosis 40/81/4481   Alcohol withdrawal syndrome with complication ([place]) 85/63/1497   Diabetes ([place]) 02/21/2019   Left rotator cuff tear arthropathy 12/28/2015   Hyperlipidemia 12/24/2015   Tobacco abuse 11/06/2015   Mood disturbance 11/06/2015   CAD (coronary artery disease) 10/16/2015   PAD (peripheral artery disease) ([place]) 10/16/2015   Preventative health care 08/13/2015   Renal artery stenosis ([place]) 07/30/2015   AKI (acute kidney injury) ([place]) 07/30/2015   Kidney atrophy 07/30/2015   CKD (chronic kidney disease), stage III ([place]) 07/30/2015   Insomnia 07/22/2015   History of substance abuse ([place]) 07/15/2015   BPH (benign prostatic hyperplasia) 07/15/2015   Gout 07/15/2015   Kidney stones 05/11/2015   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 [place] [address] Phone: [phone] Fax: [place], [place] - [address] [place] [address] Phone: [phone] Fax: [phone]     Social Determinants of Health (SDOH) Interventions    Readmission Risk Interventions Readmission Risk Prevention Plan 03/15/2019  Transportation Screening Complete  PCP or Specialist Appt within 3-5 Days Complete  HRI or [place] Complete  Social Work Consult for [place] Planning/Counseling Complete  Palliative Care Screening Complete  Medication Review Press photographer) Complete  Some recent data might be hidden

## 2021-10-19 NOTE — Progress Notes (Addendum)
NAME:  Edward Singleton, MRN:  [id number], DOB:  10/02/1951, LOS: 1 ADMISSION DATE:  10/18/2021, CONSULTATION DATE: 10/18/2021 REFERRING MD: Dr. Ellender Hose, CHIEF COMPLAINT: Fall  History of Present Illness:  71 year old male with long history of chronic alcohol use was reportedly found down in his house.  He was last seen well several days ago.  The patient bedside is alert and responsive but confused as to the timetable of the last few days.  He reports that he had been drinking too much alcohol and fell.  He believes he fell on Sunday but is unclear as to what happened or the timing of the event.  Police were called to the home and had to break down the door finding him on the floor with significant bruising lying on his right arm.  Per ED/EMS report the patient was confused, hypothermic and hypotensive receiving IV fluids in route to great effect. Per Tamela Oddi, who is healthcare power of attorney, the patient has been binge drinking since the summer.  She brings him groceries, cleans up and lays out his medications as well as having control of his finances.  However she reports he will barter the groceries she gives him 1/2 gallons of liquor.  Both the patient and the sister unclear as to the actual amount of liquor being consumed on a regular basis.  ED course: Upon arrival in the ER patient complaining of cold and thirst, became nauseous with some vomiting episodes.  He received warmed IV fluid, empiric antibiotics.  On-call orthopedist was consulted with additional imaging ordered and instructions to place right arm in a sling.  After aggressive IV fluid resuscitation patient requiring vasopressor support. medications given: Sodium bicarbonate drip, Levophed drip, cefepime/Flagyl and vancomycin, 3 L NS IV fluid bolus Initial Vitals: Hypothermic at 90.5 F, RR 15, bradycardic at 52, hypotensive at 66/40 and SPO2 100% on room air Significant labs: (Labs/ Imaging personally reviewed) I, Domingo Pulse  [place], AGACNP-BC, personally viewed and interpreted this ECG. EKG Interpretation: Date: 10/18/2021, EKG Time: 1750, Rate: 56, Rhythm: Sinus bradycardia, QRS Axis: Normal, Intervals: Prolonged QTC, ST/T Wave abnormalities: None, Narrative Interpretation: SB with prolonged QTC Chemistry: Mild hyponatremia Na+: 134, K+: 3.5, AKI -BUN/Cr.:  64/4.3, AGMA -serum CO2/ AG: 9/34, elevated lipase at 57, CK: 990 Hematology: Leukocytosis WBC: 24.3, Hgb: 11.3,  Lactic/ PCT: 8.2> 5.3/1.25, COVID-19 & Influenza A/B: Negative VBG: 7.12/ 31/ 41/ 10.1 CXR 10/17/21: No active cardiopulmonary disease. Acute comminuted, displaced, and angulated proximal right humeral fracture. 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  No hydronephrosis. Small to moderate-sized hiatal hernia. Fluid in the distal esophagus may be related to reflux or dysmotility. Sigmoid diverticulosis.  No active diverticulitis. CT shoulder right without contrast 10/18/2021: Pending  PCCM consulted for admission due to mild rhabdomyolysis with severe anion gap metabolic acidosis and circulatory shock requiring vasopressor support.  Pertinent  Medical History  Alcohol abuse Atrophic kidney Alcohol induced dementia Type 2 diabetes mellitus Gout Hypercholesteremia Hypertension Pelvic fracture (11/2014)  Significant Hospital Events: Including procedures, antibiotic start and stop dates in addition to other pertinent events   10/18/2021: Admit to ICU with rhabdomyolysis and alcoholic ketoacidosis with circulatory shock requiring vasopressor support. 10/19/2021: Orthopedics following, recommends transfer to [facility] for surgery once medically stable.  Weaning off vasopressors following IVF resuscitation. Preliminary Blood culture results with Streptococcus species, on Cefepime.  Nephrology & ID consulted.  Interim History / Subjective:  -Central line placed earlier this morning due to vasopressor requirements -Weaning vasopressors this morning ~ receiving 1L fluid balance to assess for fluid responsiveness and check CVP post bolus for additional resuscitation -Afebrile, hemodynamically stable -Pt reports 8/10 right arm & shoulder pain, otherwise denies all complaints -Preliminary Blood culture results with Streptococcus ~will consult ID -Creatinine slightly improved to 3.32 (3.68), acidosis improved with Bicarb gtt, UOP 840 cc last 24 hrs (net + 2.7L since admit)  -CK slightly worsened to 2341 (990) ~ will consult Nephrology -Orthopedics recommends transfer to [facility] for surgery once medically stable due to complicated right proximal humerus fracture  Objective   Blood pressure 133/68, pulse 78, temperature 99.5 F (37.5 C), resp. rate 15, height 5\' 8"  (1.727 m), weight 72.6 kg, SpO2 98 %.        Intake/Output Summary (Last 24 hours) at 10/19/2021 0856 Last data filed at 10/19/2021 0830 Gross per 24 hour  Intake 2979.43 ml  Output 1065 ml  Net 1914.43 ml   Filed Weights   10/18/21 1839  Weight: 72.6 kg    Examination: General: Acutely ill appearing Adult male, lying in bed, on 2L [place],  NAD HEENT: MM pink/moist, anicteric, atraumatic, neck supple Neuro: A&O x 3-4, able to follow commands, PERRL +3, MAE > except right arm with acute fracture CV: s1s2 RRR, NSR on monitor, no r/m/g Pulm: Regular, non labored on room air, breath sounds clear-BUL & diminished-BLL GI: soft, rounded, non tender, bs x 4 GU: foley in place with clear amber urine Skin: Significant ecchymosis on right shoulder and upper arm, other areas of scattered ecchymosis and scabbed abrasions  Extremities: warm/dry, pulses + 2 R/P, no edema noted  Resolved  Hospital Problem list     Assessment & Plan:   Alcoholic Ketoacidosis Anion Gap Metabolic Acidosis Mild rhabdomyolysis Acute Kidney Injury superimposed on CKD Stage 4 Hypokalemia Hyponatremia Baseline Cr: 4.30, Cr on admission: 4.30, CK: 990,  -Monitor I&O's / urinary output -Follow BMP -Ensure adequate renal perfusion -Avoid nephrotoxic agents as able -Replace electrolytes as indicated -IVF -Bicarb gtt -Trend CK -Consult Nephrology, appreciate input  Multifactorial Shock: Hypovolemic +/- Septic Prolonged QTc -Continuous cardiac monitoring -Serial EKG's -Maintain MAP >65 -IV fluids ~ follow CVP to guide further resuscitation -Vasopressors as needed to maintain MAP goal -Trend lactic acid until normalized (8.2 ~ 5.3 ~ 2.1 ) -Trend HS Troponin until peaked -Echocardiogram pending  Suspected sepsis due to unknown etiology in the setting of severe metabolic acidosis Streptococcus BACTEREMIA  Lactic: 8.2> 5.3, PCT: 1.25. Imaging & UA has not provided a clear source, will however continue empiric ABX for time being as patient is stabilized Initial interventions/workup included: 3 L of NS & Cefepime/ Vancomycin/ Metronidazole -Monitor fever curve -Trend WBC's & Procalcitonin -Follow cultures as above -Discussed with Dr. Mortimer Fries, Continue empiric Cefepime pending cultures & sensitivities -Echocardiogram pending -ID consulted, appreciate input  Acute Right Humerus Fracture - f/u CT of RIGHT shoulder - place sling, per ortho recommendations - low dose fentanyl IV PRN due to kidney failure & nausea/vomiting - Orthopedics consulted appreciate input ~ recommends transfer to [facility] for surgery once medically stable and swelling has improved given complicated right proximal humerus fracture - falls precautions  ETOH abuse High risk for development of DT's Alcoholic Dementia - CIWA Q 4 h, per protocol  - Ativan per  CIWA protocol & PRN - daily thiamine, folic acid &  multi-vitamin > patient takes thiamine outpatient, therefore will not give high dose thiamine and simply continue prescription at 100 mg -Discussed with Dr. Mortimer Fries ~will order for alcohol with meals in hopes of preventing severe DT's  Type 2 Diabetes Mellitus - Monitor CBG Q 4 hours - SSI moderate dosing - target range while in ICU: 140-180 - follow ICU hyper/hypo-glycemia protocol - outpatient Metformin on hold   Best Practice (right click and "Reselect all SmartList Selections" daily)  Diet/type: regular DVT prophylaxis: prophylactic heparin  GI prophylaxis: PPI Lines: central line and is still needed Foley:  Yes, and it is still needed Code Status:  full code Last date of multidisciplinary goals of care discussion [10/19/21]  Labs   CBC: Recent Labs  Lab 10/18/21 1800 10/19/21 0504  WBC 24.3* 18.4*  NEUTROABS 20.4*  --   HGB 11.3* 8.6*  HCT 34.4* 24.0*  MCV 104.9* 94.5  PLT 275 240     Basic Metabolic Panel: Recent Labs  Lab 10/18/21 1800 10/18/21 2016 10/19/21 0504  NA 134* 134* 130*  K 3.5 3.5 2.8*  CL 91* 100 92*  CO2 9* 10* 23  GLUCOSE 170* 192* 214*  BUN 64* 59* 59*  CREATININE 4.30* 3.68* 3.32*  CALCIUM 8.1* 6.7* 7.5*  MG 2.6*  --  1.7  PHOS  --  7.6* 4.9*    GFR: Estimated Creatinine Clearance: 20.3 mL/min (A) (by C-G formula based on SCr of 3.32 mg/dL (H)). Recent Labs  Lab 10/18/21 1800 10/18/21 1824 10/18/21 1950 10/19/21 0125 10/19/21 0504  PROCALCITON 1.25  --   --   --  1.34  WBC 24.3*  --   --   --  18.4*  LATICACIDVEN  --  8.2* 5.3* 2.1*  --      Liver Function Tests: Recent Labs  Lab 10/18/21 1800 10/19/21 0504  AST 37 46*  ALT 19 21  ALKPHOS 100 83  BILITOT 1.0 1.0  PROT 6.2* 5.3*  ALBUMIN 3.4* 3.0*    Recent Labs  Lab 10/18/21 1800 10/19/21 0504  LIPASE 57* 112*    No results for input(s): AMMONIA in the last 168 hours.  ABG    Component Value Date/Time   HCO3 24.6 10/19/2021 0555   TCO2 20 11/17/2014 1215    ACIDBASEDEF 17.9 (H) 10/18/2021 2012   O2SAT 67.6 10/19/2021 0555      Coagulation Profile: No results for input(s): INR, PROTIME in the last 168 hours.  Cardiac Enzymes: Recent Labs  Lab 10/18/21 1800 10/19/21 0504  CKTOTAL 990* 2,341*     HbA1C: Hemoglobin A1C  Date/Time Value Ref Range Status  01/15/2014 01:20 PM 5.6 4.2 - 6.3 % Final    Comment:    The American Diabetes Association recommends that a primary goal of therapy should be <7% and that physicians should reevaluate the treatment regimen in patients with HbA1c values consistently >8%.    Hgb A1c MFr Bld  Date/Time Value Ref Range Status  03/16/2019 04:25 AM 7.0 (H) 4.8 - 5.6 % Final    Comment:    (NOTE) Pre diabetes:          5.7%-6.4% Diabetes:              >6.4% Glycemic control for   <7.0% adults with diabetes     CBG: Recent Labs  Lab 10/18/21 2322 10/19/21 0421 10/19/21 0731  GLUCAP 244* 207* 200*    Review of Systems: Positives  in BOLD   Gen: Denies fever, chills, weight change, fatigue, night sweats HEENT: Denies blurred vision, double vision, hearing loss, tinnitus, sinus congestion, rhinorrhea, sore throat, neck stiffness, dysphagia PULM: Denies shortness of breath, cough, sputum production, hemoptysis, wheezing CV: Denies chest pain, edema, orthopnea, paroxysmal nocturnal dyspnea, palpitations GI: Denies abdominal pain, nausea, vomiting, diarrhea, hematochezia, melena, constipation, change in bowel habits GU: Denies dysuria, hematuria, polyuria, oliguria, urethral discharge Endocrine: Denies hot or cold intolerance, polyuria, polyphagia or appetite change Derm: Denies rash, dry skin, scaling or peeling skin change Heme: Denies easy bruising, bleeding, bleeding gums Neuro: Denies headache, numbness, weakness, slurred speech, loss of memory or consciousness  Past Medical History:  He,  has a past medical history of Alcohol abuse, Atrophic kidney, Benign enlargement of prostate,  Dementia ([place]), Diabetes mellitus without complication ([place]), Gout, Gout, Hypercholesteremia, Hypertension, Kidney stones, Left flank pain, chronic, and Pelvic fracture ([place]) (11/17/2014).   Surgical History:   Past Surgical History:  Procedure Laterality Date   ESOPHAGOGASTRODUODENOSCOPY Left 03/18/2019   Procedure: ESOPHAGOGASTRODUODENOSCOPY (EGD);  Surgeon: Jonathon Bellows, MD;  Location: [facility] ENDOSCOPY;  Service: Gastroenterology;  Laterality: Left;   left renal stent placement  2013   LITHOTRIPSY     ORIF HUMERUS FRACTURE Left 11/18/2014   Procedure: OPEN REDUCTION INTERNAL FIXATION (ORIF) DISTAL HUMERUS FRACTURE;  Surgeon: Rozanna Box, MD;  Location: [place];  Service: Orthopedics;  Laterality: Left;   SPLENECTOMY       Social History:   reports that he has been smoking cigarettes. He has a 50.00 pack-year smoking history. He has quit using smokeless tobacco.  His smokeless tobacco use included chew. He reports that he does not currently use alcohol after a past usage of about 42.0 standard drinks per week. He reports that he does not use drugs.   Family History:  His family history includes Diabetes in his father; Heart disease in his father and mother; Nephrolithiasis in his paternal grandfather. There is no history of Kidney disease or Prostate cancer.   Allergies No Known Allergies   Home Medications  Prior to Admission medications   Medication Sig Start Date End Date Taking? Authorizing Provider  allopurinol (ZYLOPRIM) 300 MG tablet Take 150 mg by mouth daily.    [provider]  amLODipine (NORVASC) 5 MG tablet Take 5 mg by mouth daily at 12 noon. 06/04/19   [provider]  aspirin 81 MG chewable tablet Chew 81 mg by mouth daily.    [provider]  escitalopram (LEXAPRO) 10 MG tablet Take 10 mg by mouth daily.    [provider]  folic acid (FOLVITE) 1 MG tablet Take 1 mg by mouth daily.    [provider]  metFORMIN (GLUCOPHAGE) 500  MG tablet Take 500 mg by mouth 2 (two) times daily. 06/04/19   [provider]  metoprolol succinate (TOPROL-XL) 50 MG 24 hr tablet Take 0.5 tablets by mouth daily. 06/04/19   [provider]  rosuvastatin (CRESTOR) 20 MG tablet Take 20 mg by mouth daily.    [provider]  thiamine (VITAMIN B-1) 100 MG tablet Take 100 mg by mouth daily.    [provider]  traZODone (DESYREL) 50 MG tablet Take 50 mg by mouth daily.    [provider]     Critical care time: 40 minutes     Darel Hong, AGACNP-BC  Pulmonary & [place] epic messenger for cross cover needs If after hours, please call E-link

## 2021-10-20 ENCOUNTER — Inpatient Hospital Stay: Payer: No Typology Code available for payment source

## 2021-10-20 DIAGNOSIS — R7881 Bacteremia: Secondary | ICD-10-CM | POA: Diagnosis not present

## 2021-10-20 DIAGNOSIS — N179 Acute kidney failure, unspecified: Secondary | ICD-10-CM | POA: Diagnosis not present

## 2021-10-20 DIAGNOSIS — T796XXA Traumatic ischemia of muscle, initial encounter: Secondary | ICD-10-CM | POA: Diagnosis not present

## 2021-10-20 DIAGNOSIS — B955 Unspecified streptococcus as the cause of diseases classified elsewhere: Secondary | ICD-10-CM | POA: Diagnosis not present

## 2021-10-20 LAB — CBC
HCT: 22 % — ABNORMAL LOW (ref 39.0–52.0)
Hemoglobin: 7.6 g/dL — ABNORMAL LOW (ref 13.0–17.0)
MCH: 33.6 pg (ref 26.0–34.0)
MCHC: 34.5 g/dL (ref 30.0–36.0)
MCV: 97.3 fL (ref 80.0–100.0)
Platelets: 180 10*3/uL (ref 150–400)
RBC: 2.26 MIL/uL — ABNORMAL LOW (ref 4.22–5.81)
RDW: 14.7 % (ref 11.5–15.5)
WBC: 16.6 10*3/uL — ABNORMAL HIGH (ref 4.0–10.5)
nRBC: 0.1 % (ref 0.0–0.2)

## 2021-10-20 LAB — HEPATIC FUNCTION PANEL
ALT: 23 U/L (ref 0–44)
AST: 42 U/L — ABNORMAL HIGH (ref 15–41)
Albumin: 2.7 g/dL — ABNORMAL LOW (ref 3.5–5.0)
Alkaline Phosphatase: 73 U/L (ref 38–126)
Bilirubin, Direct: 0.2 mg/dL (ref 0.0–0.2)
Indirect Bilirubin: 0.9 mg/dL (ref 0.3–0.9)
Total Bilirubin: 1.1 mg/dL (ref 0.3–1.2)
Total Protein: 5.1 g/dL — ABNORMAL LOW (ref 6.5–8.1)

## 2021-10-20 LAB — BASIC METABOLIC PANEL
Anion gap: 10 (ref 5–15)
BUN: 35 mg/dL — ABNORMAL HIGH (ref 8–23)
CO2: 32 mmol/L (ref 22–32)
Calcium: 8 mg/dL — ABNORMAL LOW (ref 8.9–10.3)
Chloride: 96 mmol/L — ABNORMAL LOW (ref 98–111)
Creatinine, Ser: 2.18 mg/dL — ABNORMAL HIGH (ref 0.61–1.24)
GFR, Estimated: 32 mL/min — ABNORMAL LOW (ref >60)
Glucose, Bld: 142 mg/dL — ABNORMAL HIGH (ref 70–99)
Potassium: 3 mmol/L — ABNORMAL LOW (ref 3.5–5.1)
Sodium: 138 mmol/L (ref 135–145)

## 2021-10-20 LAB — GLUCOSE, CAPILLARY
Glucose-Capillary: 105 mg/dL — ABNORMAL HIGH (ref 70–99)
Glucose-Capillary: 125 mg/dL — ABNORMAL HIGH (ref 70–99)
Glucose-Capillary: 134 mg/dL — ABNORMAL HIGH (ref 70–99)
Glucose-Capillary: 134 mg/dL — ABNORMAL HIGH (ref 70–99)
Glucose-Capillary: 139 mg/dL — ABNORMAL HIGH (ref 70–99)
Glucose-Capillary: 152 mg/dL — ABNORMAL HIGH (ref 70–99)

## 2021-10-20 LAB — VOLATILES,BLD-ACETONE,ETHANOL,ISOPROP,METHANOL
Acetone, blood: <0.01 g/dL (ref 0.000–0.010)
Ethanol, blood: <0.01 g/dL (ref 0.000–0.010)
Isopropanol, blood: <0.01 g/dL (ref 0.000–0.010)
Methanol, blood: <0.01 g/dL (ref 0.000–0.010)

## 2021-10-20 LAB — BLOOD GAS, VENOUS
Acid-Base Excess: 13.1 mmol/L — ABNORMAL HIGH (ref 0.0–2.0)
Bicarbonate: 37.5 mmol/L — ABNORMAL HIGH (ref 20.0–28.0)
FIO2: 0.4
O2 Saturation: 82.8 %
Patient temperature: 37
pCO2, Ven: 47 mmHg (ref 44.0–60.0)
pH, Ven: 7.51 — ABNORMAL HIGH (ref 7.250–7.430)
pO2, Ven: 42 mmHg (ref 32.0–45.0)

## 2021-10-20 LAB — CK: Total CK: 1503 U/L — ABNORMAL HIGH (ref 49–397)

## 2021-10-20 LAB — PROCALCITONIN: Procalcitonin: 0.89 ng/mL

## 2021-10-20 MED ORDER — INSULIN ASPART 100 UNIT/ML IJ SOLN
0.0000 [IU] | Freq: Three times a day (TID) | INTRAMUSCULAR | Status: DC
  Administered 2021-10-20 – 2021-10-26 (×10): 2 [IU] via SUBCUTANEOUS
  Filled 2021-10-20 (×10): qty 1

## 2021-10-20 MED ORDER — POTASSIUM CHLORIDE 10 MEQ/50ML IV SOLN
10.0000 meq | INTRAVENOUS | Status: AC
  Administered 2021-10-20 (×2): 10 meq via INTRAVENOUS
  Filled 2021-10-20 (×2): qty 50

## 2021-10-20 MED ORDER — POTASSIUM CHLORIDE CRYS ER 20 MEQ PO TBCR
40.0000 meq | EXTENDED_RELEASE_TABLET | Freq: Once | ORAL | Status: AC
  Administered 2021-10-20: 40 meq via ORAL
  Filled 2021-10-20: qty 2

## 2021-10-20 MED ORDER — SODIUM CHLORIDE 0.9 % IV SOLN
2.0000 g | INTRAVENOUS | Status: DC
  Administered 2021-10-20 – 2021-10-24 (×5): 2 g via INTRAVENOUS
  Filled 2021-10-20 (×2): qty 2
  Filled 2021-10-20 (×3): qty 20
  Filled 2021-10-20: qty 2
  Filled 2021-10-20: qty 20

## 2021-10-20 MED ORDER — IPRATROPIUM-ALBUTEROL 0.5-2.5 (3) MG/3ML IN SOLN
3.0000 mL | Freq: Four times a day (QID) | RESPIRATORY_TRACT | Status: DC | PRN
  Administered 2021-10-20: 3 mL via RESPIRATORY_TRACT
  Filled 2021-10-20: qty 3

## 2021-10-20 MED ORDER — POTASSIUM CHLORIDE CRYS ER 20 MEQ PO TBCR
40.0000 meq | EXTENDED_RELEASE_TABLET | Freq: Two times a day (BID) | ORAL | Status: DC
Start: 2021-10-20 — End: 2021-10-20

## 2021-10-20 MED ORDER — SODIUM CHLORIDE 0.9 % IV SOLN: INTRAVENOUS | Status: DC | PRN

## 2021-10-20 MED ORDER — ALBUTEROL SULFATE (2.5 MG/3ML) 0.083% IN NEBU
2.5000 mg | INHALATION_SOLUTION | RESPIRATORY_TRACT | Status: DC | PRN
  Administered 2021-10-22: 2.5 mg via RESPIRATORY_TRACT
  Filled 2021-10-20: qty 3

## 2021-10-20 MED ORDER — BUDESONIDE 0.25 MG/2ML IN SUSP
0.2500 mg | Freq: Two times a day (BID) | RESPIRATORY_TRACT | Status: DC
  Administered 2021-10-20 (×2): 0.25 mg via RESPIRATORY_TRACT
  Filled 2021-10-20 (×2): qty 2

## 2021-10-20 MED ORDER — HYDROMORPHONE HCL 2 MG PO TABS
1.0000 mg | ORAL_TABLET | Freq: Four times a day (QID) | ORAL | Status: DC | PRN
  Administered 2021-10-20 – 2021-10-22 (×7): 1 mg via ORAL
  Filled 2021-10-20 (×8): qty 1

## 2021-10-20 MED ORDER — IPRATROPIUM-ALBUTEROL 0.5-2.5 (3) MG/3ML IN SOLN
3.0000 mL | Freq: Four times a day (QID) | RESPIRATORY_TRACT | Status: DC
  Administered 2021-10-20 – 2021-10-21 (×3): 3 mL via RESPIRATORY_TRACT
  Filled 2021-10-20 (×3): qty 3

## 2021-10-20 NOTE — Progress Notes (Signed)
[facility]  ROUNDING NOTE   Subjective:   Edward Singleton is a 71 year old male with past medical history of alcohol abuse, diabetes, hypertension, gout, and chronic kidney disease stage IV.  Patient presents to the emergency department after being found down at his home, suspected for several days.  Patient is admitted for Rhabdomyolysis [M62.82]   Patient is currently followed by a nephrologist with the [facility]. he states he knows he has kidney trouble, and it gets worse when he drinks.  Patient is unable to speak to the events that brought him into the hospital, states he does not remember.  Chart review reveals he had fallen and on arrival to the emergency department, was unable to give a timeline of events.  Patient's sister, Freda Munro, has healthcare power of attorney and is unsure of how much alcohol patient drinks daily.  She states patient has been binge drinking since the summer.  Patient currently seen sitting up in bed eating breakfast.  Denies shortness of breath but remains on supplemental oxygen, 3 L.  Currently denies nausea, vomiting, and diarrhea.  Current labs include potassium 3.0, glucose 142, and creatinine 2.18 and GFR 32. CT shoulder shows displaced and comminuted fracture of right proximal humerus.   We have been consulted to evaluate AKI   Objective:  Vital signs in last 24 hours:  Temp:  [99.1 F (37.3 C)-100 F (37.8 C)] 99.3 F (37.4 C) (01/11 1201) Pulse Rate:  [79-102] 89 (01/11 1201) Resp:  [15-27] 27 (01/11 1201) BP: (108-143)/(59-79) 117/70 (01/11 1201) SpO2:  [86 %-97 %] 93 % (01/11 1201) Weight:  [72.5 kg] 72.5 kg (01/11 0500)  Weight change: -0.075 kg Filed Weights   10/18/21 1839 10/20/21 0500  Weight: 72.6 kg 72.5 kg    Intake/Output: I/O last 3 completed shifts: In: 7460.1 [P.O.:960; I.V.:2780; IV Piggyback:3720.1] Out: 4941 [Urine:4940; Stool:1]   Intake/Output this shift:  Total I/O In: 390.9 [P.O.:360; IV Piggyback:30.9] Out:  1100 [Urine:1100]  Physical Exam: General: NAD, sitting up in bed  Head: Normocephalic, atraumatic. Moist oral mucosal membranes  Eyes: Anicteric  Lungs:  Clear to auscultation, normal effort, 3L [place]  Heart: Regular rate and rhythm  Abdomen:  Soft, nontender  Extremities:  no peripheral edema.  Neurologic: Nonfocal, moving all four extremities  Skin: RUE ecchymosis       Basic Metabolic Panel: Recent Labs  Lab 10/18/21 1800 10/18/21 2016 10/19/21 0504 10/19/21 1402 10/20/21 0650  NA 134* 134* 130* 133* 138  K 3.5 3.5 2.8* 3.1*   3.1* 3.0*  CL 91* 100 92* 94* 96*  CO2 9* 10* 23 31 32  GLUCOSE 170* 192* 214* 122* 142*  BUN 64* 59* 59* 48* 35*  CREATININE 4.30* 3.68* 3.32* 2.56* 2.18*  CALCIUM 8.1* 6.7* 7.5* 7.5* 8.0*  MG 2.6*  --  1.7  --   --   PHOS  --  7.6* 4.9*  --   --     Liver Function Tests: Recent Labs  Lab 10/18/21 1800 10/19/21 0504 10/20/21 0650  AST 37 46* 42*  ALT 19 21 23   ALKPHOS 100 83 73  BILITOT 1.0 1.0 1.1  PROT 6.2* 5.3* 5.1*  ALBUMIN 3.4* 3.0* 2.7*   Recent Labs  Lab 10/18/21 1800 10/19/21 0504  LIPASE 57* 112*   No results for input(s): AMMONIA in the last 168 hours.  CBC: Recent Labs  Lab 10/18/21 1800 10/19/21 0504 10/20/21 0650  WBC 24.3* 18.4* 16.6*  NEUTROABS 20.4*  --   --  HGB 11.3* 8.6* 7.6*  HCT 34.4* 24.0* 22.0*  MCV 104.9* 94.5 97.3  PLT 275 240 180    Cardiac Enzymes: Recent Labs  Lab 10/18/21 1800 10/19/21 0504 10/20/21 0650  CKTOTAL 990* 2,341* 1,503*    BNP: Invalid input(s): POCBNP  CBG: Recent Labs  Lab 10/19/21 1955 10/20/21 0007 10/20/21 0450 10/20/21 0740 10/20/21 1118  GLUCAP 137* 139* 134* 134* 152*    Microbiology: Results for orders placed or performed during the hospital encounter of 10/18/21  Blood culture (routine x 2)     Status: Abnormal (Preliminary result)   Collection Time: 10/18/21  6:24 PM   Specimen: BLOOD  Result Value Ref Range Status   Specimen Description    Final    BLOOD FOOT Performed at [facility], [address] [place], [place] [address]    Special Requests   Final    BOTTLES DRAWN AEROBIC AND ANAEROBIC BCAV Performed at [facility], [address], [place], [place] [address]    Culture  Setup Time   Final    Organism ID to follow IN [place] CRITICAL RESULT CALLED TO, READ BACK BY AND VERIFIED WITH: NATHAN BEULE 10/19/21 0602 MW    Culture (A)  Final    STREPTOCOCCUS GALLOLYTICUS STAPHYLOCOCCUS EPIDERMIDIS THE SIGNIFICANCE OF ISOLATING THIS ORGANISM FROM A SINGLE SET OF BLOOD CULTURES WHEN MULTIPLE SETS ARE DRAWN IS UNCERTAIN. PLEASE NOTIFY THE MICROBIOLOGY DEPARTMENT WITHIN ONE WEEK IF SPECIATION AND SENSITIVITIES ARE REQUIRED. CRITICAL RESULT CALLED TO, READ BACK BY AND VERIFIED WITH: [place]. [phone] FCP Performed at [facility], [address], [place], [place] [address]    Report Status PENDING  Incomplete  Blood Culture ID Panel (Reflexed)     Status: Abnormal   Collection Time: 10/18/21  6:24 PM  Result Value Ref Range Status   Enterococcus faecalis NOT DETECTED NOT DETECTED Final   Enterococcus Faecium NOT DETECTED NOT DETECTED Final   Listeria monocytogenes NOT DETECTED NOT DETECTED Final   Staphylococcus species NOT DETECTED NOT DETECTED Final   Staphylococcus aureus (BCID) NOT DETECTED NOT DETECTED Final   Staphylococcus epidermidis NOT DETECTED NOT DETECTED Final   Staphylococcus lugdunensis NOT DETECTED NOT DETECTED Final   Streptococcus species DETECTED (A) NOT DETECTED Final    Comment: Not Enterococcus species, Streptococcus agalactiae, Streptococcus pyogenes, or Streptococcus pneumoniae. CRITICAL RESULT CALLED TO, READ BACK BY AND VERIFIED WITH: NATHAN BEULE 10/19/21 0602 MW    Streptococcus agalactiae NOT DETECTED NOT DETECTED Final   Streptococcus pneumoniae NOT DETECTED NOT DETECTED Final   Streptococcus pyogenes NOT  DETECTED NOT DETECTED Final   A.calcoaceticus-baumannii NOT DETECTED NOT DETECTED Final   Bacteroides fragilis NOT DETECTED NOT DETECTED Final   Enterobacterales NOT DETECTED NOT DETECTED Final   Enterobacter cloacae complex NOT DETECTED NOT DETECTED Final   Escherichia coli NOT DETECTED NOT DETECTED Final   Klebsiella aerogenes NOT DETECTED NOT DETECTED Final   Klebsiella oxytoca NOT DETECTED NOT DETECTED Final   Klebsiella pneumoniae NOT DETECTED NOT DETECTED Final   Proteus species NOT DETECTED NOT DETECTED Final   Salmonella species NOT DETECTED NOT DETECTED Final   Serratia marcescens NOT DETECTED NOT DETECTED Final   Haemophilus influenzae NOT DETECTED NOT DETECTED Final   Neisseria meningitidis NOT DETECTED NOT DETECTED Final   Pseudomonas aeruginosa NOT DETECTED NOT DETECTED Final   Stenotrophomonas maltophilia NOT DETECTED NOT DETECTED Final   Candida albicans NOT DETECTED NOT DETECTED Final   Candida  auris NOT DETECTED NOT DETECTED Final   Candida glabrata NOT DETECTED NOT DETECTED Final   Candida krusei NOT DETECTED NOT DETECTED Final   Candida parapsilosis NOT DETECTED NOT DETECTED Final   Candida tropicalis NOT DETECTED NOT DETECTED Final   Cryptococcus neoformans/gattii NOT DETECTED NOT DETECTED Final    Comment: Performed at [facility], [address] [place], [place] [address]  Resp Panel by RT-PCR (Flu A&B, Covid) Nasopharyngeal Swab     Status: None   Collection Time: 10/18/21  8:16 PM   Specimen: Nasopharyngeal Swab; Nasopharyngeal(NP) swabs in vial transport medium  Result Value Ref Range Status   SARS Coronavirus 2 by RT PCR NEGATIVE NEGATIVE Final    Comment: (NOTE) SARS-CoV-2 target nucleic acids are NOT DETECTED.  The SARS-CoV-2 RNA is generally detectable in upper respiratory specimens during the acute phase of infection. The lowest concentration of SARS-CoV-2 viral copies this assay can detect is 138 copies/mL. A negative result does not  preclude SARS-Cov-2 infection and should not be used as the sole basis for treatment or other patient management decisions. A negative result may occur with  improper specimen collection/handling, submission of specimen other than nasopharyngeal swab, presence of viral mutation(s) within the areas targeted by this assay, and inadequate number of viral copies(<138 copies/mL). A negative result must be combined with clinical observations, patient history, and epidemiological information. The expected result is Negative.  Fact Sheet for Patients:  EntrepreneurPulse.com.au  Fact Sheet for Healthcare Providers:  IncredibleEmployment.be  This test is no t yet approved or cleared by the [place] FDA and  has been authorized for detection and/or diagnosis of SARS-CoV-2 by FDA under an Emergency Use Authorization (EUA). This EUA will remain  in effect (meaning this test can be used) for the duration of the COVID-19 declaration under Section 564(b)(1) of the Act, 21 U.S.C.section 360bbb-3(b)(1), unless the authorization is terminated  or revoked sooner.       Influenza A by PCR NEGATIVE NEGATIVE Final   Influenza B by PCR NEGATIVE NEGATIVE Final    Comment: (NOTE) The Xpert Xpress SARS-CoV-2/FLU/RSV plus assay is intended as an aid in the diagnosis of influenza from Nasopharyngeal swab specimens and should not be used as a sole basis for treatment. Nasal washings and aspirates are unacceptable for Xpert Xpress SARS-CoV-2/FLU/RSV testing.  Fact Sheet for Patients: EntrepreneurPulse.com.au  Fact Sheet for Healthcare Providers: IncredibleEmployment.be  This test is not yet approved or cleared by the [place] FDA and has been authorized for detection and/or diagnosis of SARS-CoV-2 by FDA under an Emergency Use Authorization (EUA). This EUA will remain in effect (meaning this test can be used) for the  duration of the COVID-19 declaration under Section 564(b)(1) of the Act, 21 U.S.C. section 360bbb-3(b)(1), unless the authorization is terminated or revoked.  Performed at [facility], [address] [place], [place] [address]   Culture, blood (Routine X 2) w Reflex to ID Panel     Status: None (Preliminary result)   Collection Time: 10/18/21 11:28 PM   Specimen: BLOOD  Result Value Ref Range Status   Specimen Description BLOOD LEFT HAND  Final   Special Requests   Final    BOTTLES DRAWN AEROBIC ONLY Blood Culture adequate volume   Culture   Final    NO GROWTH 2 DAYS Performed at [facility], [address] [place], [place] [address]    Report Status PENDING  Incomplete  MRSA Next Gen by PCR, Nasal  Status: None   Collection Time: 10/19/21  5:04 AM   Specimen: Nasal Mucosa; Nasal Swab  Result Value Ref Range Status   MRSA by PCR Next Gen NOT DETECTED NOT DETECTED Final    Comment: (NOTE) The GeneXpert MRSA Assay (FDA approved for NASAL specimens only), is one component of a comprehensive MRSA colonization surveillance program. It is not intended to diagnose MRSA infection nor to guide or monitor treatment for MRSA infections. Test performance is not FDA approved in patients less than 10 years old. Performed at [facility], [address] [place], [place] [address]     Coagulation Studies: No results for input(s): LABPROT, INR in the last 72 hours.  Urinalysis: Recent Labs    10/18/21 1755  COLORURINE YELLOW*  LABSPEC 1.012  PHURINE 5.0  GLUCOSEU NEGATIVE  HGBUR MODERATE*  BILIRUBINUR NEGATIVE  KETONESUR NEGATIVE  PROTEINUR 100*  NITRITE NEGATIVE  LEUKOCYTESUR NEGATIVE      Imaging: CT ABDOMEN PELVIS WO CONTRAST  Result Date: 10/18/2021 CLINICAL DATA:  Abdominal pain, acute, nonlocalized. Found on floor. EXAM: CT ABDOMEN AND PELVIS WITHOUT CONTRAST TECHNIQUE: Multidetector CT imaging of the abdomen and pelvis was  performed following the standard protocol without IV contrast. COMPARISON:  05/11/2015 FINDINGS: Lower chest: Small to moderate-sized hiatal hernia. Fluid in the distal esophagus may reflect reflux. No effusions. Coronary artery and aortic calcifications. Hepatobiliary: No focal hepatic abnormality. Gallbladder unremarkable. Pancreas: No focal abnormality or ductal dilatation. Spleen: Prior splenectomy Adrenals/Urinary Tract: Left kidney is atrophic. 5.3 cm cyst off the midpole of the left kidney. Bilateral nephrolithiasis. No hydronephrosis. Small scattered low-density lesions in the right kidney likely reflects cysts. Adrenal glands unremarkable. Foley catheter in the bladder which is decompressed. Stomach/Bowel: Sigmoid diverticulosis. No active diverticulitis. Mild distention of the stomach. Small bowel is decompressed. Vascular/Lymphatic: Heavily calcified aorta and branch vessels. No aneurysm or adenopathy. Reproductive: No visible focal abnormality. Other: No free fluid or free air. Musculoskeletal: Old healed fractures of the left superior and inferior pubic rami. No acute bony abnormality. IMPRESSION: Bilateral nephrolithiasis.  No hydronephrosis. Small to moderate-sized hiatal hernia. Fluid in the distal esophagus may be related to reflux or dysmotility. Sigmoid diverticulosis.  No active diverticulitis. Heavily calcified aorta. Electronically Signed   By: Rolm Baptise M.D.   On: 10/18/2021 20:26   DG Chest 1 View  Result Date: 10/20/2021 CLINICAL DATA:  Shortness of breath EXAM: CHEST  1 VIEW COMPARISON:  10/19/2021 FINDINGS: Right IJ line with tip at the SVC. Generous lung volumes with mild interstitial coarsening attributed to COPD. Normal heart size and mediastinal contours. There is no edema, consolidation, effusion, or pneumothorax. IMPRESSION: No active disease. Electronically Signed   By: Jorje Guild M.D.   On: 10/20/2021 04:02   CT HEAD WO CONTRAST (5MM)  Result Date:  10/18/2021 CLINICAL DATA:  Head trauma, minor (Age >= 65y).  Found on floor. EXAM: CT HEAD WITHOUT CONTRAST TECHNIQUE: Contiguous axial images were obtained from the base of the skull through the vertex without intravenous contrast. 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 There is atrophy and chronic small vessel disease changes. No acute intracranial abnormality. Specifically, no hemorrhage, hydrocephalus, mass lesion, acute infarction, or significant intracranial injury. Vascular: No hyperdense vessel or unexpected calcification. Skull: No acute calvarial abnormality. 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 Atrophy, chronic microvascular disease. No acute intracranial abnormality. Electronically Signed   By: Rolm Baptise M.D.  On: 10/18/2021 20:18   CT Cervical Spine Wo Contrast  Result Date: 10/18/2021 CLINICAL DATA:  Neck trauma (Age >= 65y).  Found on floor. EXAM: CT CERVICAL SPINE WITHOUT CONTRAST TECHNIQUE: Multidetector CT imaging of the cervical spine was performed without intravenous contrast. Multiplanar CT image reconstructions were also generated. COMPARISON:  03/14/2019 FINDINGS: Alignment: No subluxation. Skull base and vertebrae: No acute fracture. No primary bone lesion or focal pathologic process. Soft tissues and spinal canal: No prevertebral fluid or swelling. No visible canal hematoma. Disc levels: Degenerative disc disease at C5-6 and C6-7 with disc space narrowing and spurring. Mild to moderate degenerative facet disease bilaterally, right greater than left. Upper chest: No acute findings Other: None IMPRESSION: No acute bony abnormality. Electronically Signed   By: Rolm Baptise M.D.   On: 10/18/2021 20:20   CT SHOULDER RIGHT WO CONTRAST  Result Date: 10/18/2021 CLINICAL DATA:  Proximal humeral fracture. EXAM: CT OF THE UPPER RIGHT EXTREMITY WITHOUT CONTRAST  TECHNIQUE: Multidetector CT imaging of the upper right extremity was performed according to the standard protocol. COMPARISON:  Radiograph dated 10/18/2021. FINDINGS: Bones/Joint/Cartilage There is a displaced and comminuted fracture of the proximal right humerus involving the humeral neck and proximal humeral diaphysis. Multiple displaced fracture fragments noted. There is proximal migration of the humeral shaft in relation to the head of the humerus. There is lateral positioning of the humeral diaphysis in relation to the head of the humerus. There is comminuted fractures of the humeral head. The bones are osteopenic. There is no dislocation. Ligaments Suboptimally assessed by CT. Muscles and Tendons There is intramuscular edema. No fluid collection or large hematoma. Soft tissues Diffuse subcutaneous edema of the right upper extremity and right axilla. No fluid collection or large hematoma. IMPRESSION: 1. Displaced and comminuted fracture of the proximal right humerus involving the humeral neck and proximal humeral diaphysis. 2. Comminuted fracture of the humeral head. Electronically Signed   By: Anner Crete M.D.   On: 10/18/2021 23:03   DG Chest Port 1 View  Result Date: 10/19/2021 CLINICAL DATA:  Central line placement EXAM: PORTABLE CHEST 1 VIEW COMPARISON:  10/18/2021 FINDINGS: Right central line tip in the SVC. No pneumothorax. Heart and mediastinal contours are within normal limits. No focal opacities or effusions. No acute bony abnormality. IMPRESSION: Right central line tip in the SVC.  No pneumothorax. No active cardiopulmonary disease. Electronically Signed   By: Rolm Baptise M.D.   On: 10/19/2021 01:20   DG Chest Portable 1 View  Result Date: 10/18/2021 CLINICAL DATA:  Shortness of breath.  Right upper arm bruising EXAM: PORTABLE CHEST 1 VIEW COMPARISON:  03/15/2019 FINDINGS: Heart size within normal limits. Atherosclerotic calcification of the aortic knob. No focal airspace consolidation,  pleural effusion, or pneumothorax. Chronic left-sided rib fractures. Remote ununited left clavicular fracture. Acute comminuted, displaced, and angulated proximal right humeral fracture. IMPRESSION: 1. No active cardiopulmonary disease. 2. Acute comminuted, displaced, and angulated proximal right humeral fracture. Electronically Signed   By: Davina Poke D.O.   On: 10/18/2021 18:20   DG Humerus Right  Result Date: 10/18/2021 CLINICAL DATA:  Right upper extremity bruising.  Found down. EXAM: RIGHT HUMERUS - 2+ VIEW COMPARISON:  None. FINDINGS: Acute comminuted fracture of the proximal right humerus with approximately 90 degrees of varus angulation. Dominant butterfly fragment measuring up to 7.5 cm which is medially displaced relative to the humeral shaft. Humeral head appears to be in articulation with the glenoid without evidence of dislocation. AC joint intact. Degenerative changes  of the elbow. Prominent soft tissue swelling the fracture site. IMPRESSION: Acute comminuted, displaced, and angulated fracture of the proximal right humerus as described. Electronically Signed   By: Davina Poke D.O.   On: 10/18/2021 18:22   ECHOCARDIOGRAM COMPLETE  Result Date: 10/19/2021    ECHOCARDIOGRAM REPORT   Patient Name:   Edward Singleton Date of Exam: 10/19/2021 Medical Rec #:  [id number]       Height:       68.0 in Accession #:    [id number]      Weight:       160.0 lb Date of Birth:  07/14/1951        BSA:          1.859 m Patient Age:    45 years        BP:           121/62 mmHg Patient Gender: M               HR:           80 bpm. Exam Location:  [facility] Procedure: 2D Echo, Color Doppler and Cardiac Doppler Indications:     I42.9 Cardiomyopathy-unspecified  History:         Patient has no prior history of Echocardiogram examinations.                  Risk Factors:Hypertension, Diabetes, HCL and Current Smoker. Hx                  of alcohol abuse.  Sonographer:     Charmayne Sheer Referring Phys:  [id number] BRITTON L  [place] Diagnosing Phys: Harrell Gave End MD  Sonographer Comments: Technically difficult study due to poor echo windows and no apical window. IMPRESSIONS  1. Left ventricular ejection fraction, by estimation, is >55%. The left ventricle has normal function. Left ventricular endocardial border not optimally defined to evaluate regional wall motion. Left ventricular diastolic parameters are indeterminate.  2. Right ventricular systolic function is normal. The right ventricular size is normal. Tricuspid regurgitation signal is inadequate for assessing PA pressure.  3. The mitral valve was not well visualized. Unable to accurately assess mitral valve regurgitation.  4. The aortic valve is tricuspid. There is mild calcification of the aortic valve. There is mild thickening of the aortic valve. Aortic valve regurgitation not well assessed. Aortic valve gradient could not be assessed.  5. The inferior vena cava is normal in size with <50% respiratory variability, suggesting right atrial pressure of 8 mmHg. FINDINGS  Left Ventricle: Left ventricular ejection fraction, by estimation, is >55%. The left ventricle has normal function. Left ventricular endocardial border not optimally defined to evaluate regional wall motion. The left ventricular internal cavity size was  normal in size. There is no left ventricular hypertrophy. Left ventricular diastolic parameters are indeterminate. Right Ventricle: The right ventricular size is normal. No increase in right ventricular wall thickness. Right ventricular systolic function is normal. Tricuspid regurgitation signal is inadequate for assessing PA pressure. Left Atrium: Left atrial size was not well visualized. Right Atrium: Right atrial size was not well visualized. Pericardium: There is no evidence of pericardial effusion. Mitral Valve: The mitral valve was not well visualized. Mild mitral annular calcification. Unable to accurately assess mitral valve regurgitation.  Tricuspid Valve: The tricuspid valve is not well visualized. Tricuspid valve regurgitation is trivial. Aortic Valve: The aortic valve is tricuspid. There is mild calcification of the aortic valve. There is mild thickening of the aortic  valve. Aortic valve regurgitation not well assessed. Aortic valve gradient could not be assessed. Pulmonic Valve: The pulmonic valve was not well visualized. Pulmonic valve regurgitation is not visualized. No evidence of pulmonic stenosis. Aorta: The aortic root is normal in size and structure. Pulmonary Artery: The pulmonary artery is not well seen. Venous: The inferior vena cava is normal in size with less than 50% respiratory variability, suggesting right atrial pressure of 8 mmHg. IAS/Shunts: No atrial level shunt detected by color flow Doppler.  LEFT VENTRICLE PLAX 2D LVIDd:         3.83 cm LVIDs:         2.75 cm LV PW:         0.88 cm LV IVS:        0.73 cm LVOT diam:     2.40 cm LVOT Area:     4.52 cm  LEFT ATRIUM         Index LA diam:    3.80 cm 2.04 cm/m                        PULMONIC VALVE AORTA                 PV Vmax:       1.09 m/s Ao Root diam: 3.70 cm PV Vmean:      72.300 cm/s                       PV VTI:        0.185 m                       PV Peak grad:  4.8 mmHg                       PV Mean grad:  2.0 mmHg   SHUNTS Systemic Diam: 2.40 cm Nelva Bush MD Electronically signed by Nelva Bush MD Signature Date/Time: 10/19/2021/3:40:12 PM    Final      Medications:    sodium chloride Stopped (10/19/21 0149)   sodium chloride     cefTRIAXone (ROCEPHIN)  IV     potassium chloride 10 mEq (10/20/21 1132)   sodium bicarbonate 150 mEq in D5W infusion Stopped (10/20/21 0300)    budesonide (PULMICORT) nebulizer solution  0.25 mg Nebulization BID   Chlorhexidine Gluconate Cloth  6 each Topical O7121   folic acid  1 mg Oral Daily   heparin  5,000 Units Subcutaneous Q8H   insulin aspart  0-15 Units Subcutaneous Q4H   ipratropium-albuterol  3 mL  Nebulization Q6H   LORazepam  0-4 mg Intravenous Q4H   Followed by   LORazepam  0-4 mg Intravenous Q8H   multivitamin with minerals  1 tablet Oral Daily   pantoprazole  40 mg Oral QHS   spiritus frumenti  1 each Oral TID AC   thiamine  100 mg Oral Daily   Or   thiamine  100 mg Intravenous Daily   sodium chloride, albuterol, docusate sodium, fentaNYL (SUBLIMAZE) injection, HYDROmorphone, LORazepam **OR** LORazepam, polyethylene glycol  Assessment/ Plan:  [name] is a 71 y.o.  male with past medical history of alcohol abuse, diabetes, hypertension, gout, and chronic kidney disease stage IV.  Patient presents to the emergency department after being found down at his home, suspected for several days.  Patient is admitted for Rhabdomyolysis [M62.82]   Acute Kidney Injury on  chronic kidney disease stage IV with baseline creatinine 2.58 and GFR of 26 on 05/13/21.  Patient currently followed by nephrology at [facility].  Admission renal function 14%. Acute kidney injury secondary to rhabdomyolysis Renal ultrasound ordered to evaluate obstruction No IV contrast exposure.  No acute indication for dialysis at this time.  Creatinine has slowly improved over hospitalization and is currently below stated baseline.  Urine output recorded at 4.1 L in past 24 hours.  Continue current treatments and will continue to monitor.   Lab Results  Component Value Date   CREATININE 2.18 (H) 10/20/2021   CREATININE 2.56 (H) 10/19/2021   CREATININE 3.32 (H) 10/19/2021    Intake/Output Summary (Last 24 hours) at 10/20/2021 1226 Last data filed at 10/20/2021 1132 Gross per 24 hour  Intake 2695.64 ml  Output 4076 ml  Net -1380.36 ml   2. Anemia of chronic kidney disease Lab Results  Component Value Date   HGB 7.6 (L) 10/20/2021    Hgb below target.  Will monitor and consider EPO  3.  Hypertension with chronic kidney disease.  Home regimen includes amlodipine and metoprolol.  Both currently held at this  time.  Current BP 117/70  4.  Diabetes mellitus type 2 with chronic kidney disease.Noninsulin-dependent diabetic.  Home regimen includes metformin.  Hemoglobin A1c 6.2 on 10/19/2021.  Primary team to manage SSI    LOS: 2 [place] 1/11/202312:26 PM

## 2021-10-20 NOTE — Progress Notes (Signed)
PROGRESS NOTE    TOSHIYUKI FREDELL  CHE:[id number] DOB: 1951/08/01 DOA: 10/18/2021 PCP: Windy Fast, MD     Brief Narrative:  Edward Singleton is a 71 year old male with long history of chronic alcohol use was reportedly found down in his house.  He was last seen well several days ago.  The patient bedside is alert and responsive but confused as to the timetable of the last few days.  He reports that he had been drinking too much alcohol and fell.  He believes he fell on Sunday but is unclear as to what happened or the timing of the event.  Police were called to the home and had to break down the door finding him on the floor with significant bruising lying on his right arm.  Per ED/EMS report the patient was confused, hypothermic and hypotensive receiving IV fluids.  Per Sister Jill, who is healthcare power of attorney, the patient has been binge drinking since the summer.  Upon arrival in the ER patient complaining of cold and thirst, became nauseous with some vomiting episodes.  He received warmed IV fluid, empiric antibiotics.  On-call orthopedist was consulted with additional imaging ordered and instructions to place right arm in a sling.  After aggressive IV fluid resuscitation patient requiring vasopressor support.  He was weaned off levophed on 1/10 and transferred to [facility] service 1/11.   New events last 24 hours / Subjective: Patient complains of significant right shoulder pain at the site of his fracture.  He was weaned off Levophed and blood pressure stable.  Assessment & Plan:   Principal Problem:   Rhabdomyolysis Active Problems:   AKI (acute kidney injury) (HCC)   Metabolic acidosis   Shock (HCC)   Septic and hypovolemic shock -Now off levophed  -Blood culture showing strep gallolyticus, starph epidermidis  -Infectious disease following -Remains on ceftriaxone  Rhabdomyolysis -CK improving  Closed comminuted right proximal humerus fracture -Orthopedic surgery consulted,  recommended to transfer to [facility] once medically stable for surgical evaluation by Dr. Haddix and Dr. Handy  -Remains in right shoulder sling for now  Alcohol abuse with risk for DTs -Continue to monitor withdrawal on CIWA protocol  AKI on CKD stage IV -Baseline creatinine 2.6 -Nephrology following -Improving   Hypokalemia -Replace, trend   DVT prophylaxis:  heparin injection 5,000 Units Start: 10/18/21 2200 SCDs Start: 10/18/21 2003  Code Status: Full Family Communication: None at bedside  Disposition Plan:  Status is: Inpatient  Remains inpatient appropriate because: Remains on IV antibiotics.  Hopeful transfer to [facility] in a couple of days for surgical fixation of his right humerus fracture   Antimicrobials:  Anti-infectives (From admission, onward)    Start     Dose/Rate Route Frequency Ordered Stop   10/20/21 2000  cefTRIAXone (ROCEPHIN) 2 g in sodium chloride 0.9 % 100 mL IVPB        2 g 200 mL/hr over 30 Minutes Intravenous Every 24 hours 10/20/21 0824     01 /10/23 2000  ceFEPIme (MAXIPIME) 2 g in sodium chloride 0.9 % 100 mL IVPB  Status:  Discontinued        2 g 200 mL/hr over 30 Minutes Intravenous Every 24 hours 10/19/21 1407 10/20/21 0824   10/19/21 0700  vancomycin (VANCOCIN) IVPB 1000 mg/200 mL premix  Status:  Discontinued        1,000 mg 200 mL/hr over 60 Minutes Intravenous  Once 10/19/21 0606 10/19/21 0609   10/19/21 0700  vancomycin (VANCOREADY)  IVPB 750 mg/150 mL        750 mg 150 mL/hr over 60 Minutes Intravenous  Once 10/19/21 0609 10/19/21 0729   10/18/21 1815  ceFEPIme (MAXIPIME) 2 g in sodium chloride 0.9 % 100 mL IVPB        2 g 200 mL/hr over 30 Minutes Intravenous  Once 10/18/21 1812 10/18/21 2023   10/18/21 1815  metroNIDAZOLE (FLAGYL) IVPB 500 mg        500 mg 100 mL/hr over 60 Minutes Intravenous  Once 10/18/21 1812 10/18/21 2225   10/18/21 1815  vancomycin (VANCOCIN) IVPB 1000 mg/200 mL premix        1,000  mg 200 mL/hr over 60 Minutes Intravenous  Once 10/18/21 1812 10/18/21 2326        Objective: Vitals:   10/20/21 1045 10/20/21 1100 10/20/21 1201 10/20/21 1300  BP:  123/79 117/70 123/71  Pulse: 94 93 89   Resp: 15 15 (!) 27 (!) 28  Temp: 99.1 F (37.3 C) 99.1 F (37.3 C) 99.3 F (37.4 C) 99.3 F (37.4 C)  TempSrc:   Bladder   SpO2: 94% 91% 93%   Weight:      Height:        Intake/Output Summary (Last 24 hours) at 10/20/2021 1355 Last data filed at 10/20/2021 1132 Gross per 24 hour  Intake 887.95 ml  Output 3576 ml  Net -2688.05 ml   Filed Weights   10/18/21 1839 10/20/21 0500  Weight: 72.6 kg 72.5 kg    Examination:  General exam: Appears calm and comfortable  Respiratory system: Clear to auscultation. Respiratory effort normal. No respiratory distress. No conversational dyspnea.  Cardiovascular system: S1 & S2 heard, RRR. No murmurs. No pedal edema. Gastrointestinal system: Abdomen is nondistended, soft and nontender. Normal bowel sounds heard. Central nervous system: Alert  Extremities: Right shoulder swelling with ice pack in place Psychiatry: Mood & affect appropriate.   Data Reviewed: I have personally reviewed following labs and imaging studies  CBC: Recent Labs  Lab 10/18/21 1800 10/19/21 0504 10/20/21 0650  WBC 24.3* 18.4* 16.6*  NEUTROABS 20.4*  --   --   HGB 11.3* 8.6* 7.6*  HCT 34.4* 24.0* 22.0*  MCV 104.9* 94.5 97.3  PLT 275 240 213   Basic Metabolic Panel: Recent Labs  Lab 10/18/21 1800 10/18/21 2016 10/19/21 0504 10/19/21 1402 10/20/21 0650  NA 134* 134* 130* 133* 138  K 3.5 3.5 2.8* 3.1*   3.1* 3.0*  CL 91* 100 92* 94* 96*  CO2 9* 10* 23 31 32  GLUCOSE 170* 192* 214* 122* 142*  BUN 64* 59* 59* 48* 35*  CREATININE 4.30* 3.68* 3.32* 2.56* 2.18*  CALCIUM 8.1* 6.7* 7.5* 7.5* 8.0*  MG 2.6*  --  1.7  --   --   PHOS  --  7.6* 4.9*  --   --    GFR: Estimated Creatinine Clearance: 30.9 mL/min (A) (by C-G formula based on SCr of 2.18  mg/dL (H)). Liver Function Tests: Recent Labs  Lab 10/18/21 1800 10/19/21 0504 10/20/21 0650  AST 37 46* 42*  ALT 19 21 23   ALKPHOS 100 83 73  BILITOT 1.0 1.0 1.1  PROT 6.2* 5.3* 5.1*  ALBUMIN 3.4* 3.0* 2.7*   Recent Labs  Lab 10/18/21 1800 10/19/21 0504  LIPASE 57* 112*   No results for input(s): AMMONIA in the last 168 hours. Coagulation Profile: No results for input(s): INR, PROTIME in the last 168 hours. Cardiac Enzymes: Recent Labs  Lab 10/18/21  1800 10/19/21 0504 10/20/21 0650  CKTOTAL 990* 2,341* 1,503*   BNP (last 3 results) No results for input(s): PROBNP in the last 8760 hours. HbA1C: Recent Labs    10/19/21 0648  HGBA1C 6.2*   CBG: Recent Labs  Lab 10/19/21 1955 10/20/21 0007 10/20/21 0450 10/20/21 0740 10/20/21 1118  GLUCAP 137* 139* 134* 134* 152*   Lipid Profile: No results for input(s): CHOL, HDL, LDLCALC, TRIG, CHOLHDL, LDLDIRECT in the last 72 hours. Thyroid Function Tests: Recent Labs    10/18/21 1800  TSH 1.008   Anemia Panel: No results for input(s): VITAMINB12, FOLATE, FERRITIN, TIBC, IRON, RETICCTPCT in the last 72 hours. Sepsis Labs: Recent Labs  Lab 10/18/21 1800 10/18/21 1824 10/18/21 1950 10/19/21 0125 10/19/21 0504 10/20/21 0650  PROCALCITON 1.25  --   --   --  1.34 0.89  LATICACIDVEN  --  8.2* 5.3* 2.1*  --   --     Recent Results (from the past 240 hour(s))  Blood culture (routine x 2)     Status: Abnormal (Preliminary result)   Collection Time: 10/18/21  6:24 PM   Specimen: BLOOD  Result Value Ref Range Status   Specimen Description   Final    BLOOD FOOT Performed at [facility], [address], [place], [place] [address]    Special Requests   Final    BOTTLES DRAWN AEROBIC AND ANAEROBIC BCAV Performed at [facility], [address], [place], [place] [address]    Culture  Setup Time   Final    Organism ID to follow IN BOTH AEROBIC AND ANAEROBIC BOTTLES GRAM POSITIVE  [facility] CRITICAL RESULT CALLED TO, READ BACK BY AND VERIFIED WITH: NATHAN BEULE 10/19/21 0602 MW    Culture (A)  Final    STREPTOCOCCUS GALLOLYTICUS STAPHYLOCOCCUS EPIDERMIDIS THE SIGNIFICANCE OF ISOLATING THIS ORGANISM FROM A SINGLE SET OF BLOOD CULTURES WHEN MULTIPLE SETS ARE DRAWN IS UNCERTAIN. PLEASE NOTIFY THE MICROBIOLOGY DEPARTMENT WITHIN ONE WEEK IF SPECIATION AND SENSITIVITIES ARE REQUIRED. CRITICAL RESULT CALLED TO, READ BACK BY AND VERIFIED WITH: [place]. [id number] FCP Performed at [facility], [address] [place], [place] [address]    Report Status PENDING  Incomplete  Blood Culture ID Panel (Reflexed)     Status: Abnormal   Collection Time: 10/18/21  6:24 PM  Result Value Ref Range Status   Enterococcus faecalis NOT DETECTED NOT DETECTED Final   Enterococcus Faecium NOT DETECTED NOT DETECTED Final   Listeria monocytogenes NOT DETECTED NOT DETECTED Final   Staphylococcus species NOT DETECTED NOT DETECTED Final   Staphylococcus aureus (BCID) NOT DETECTED NOT DETECTED Final   Staphylococcus epidermidis NOT DETECTED NOT DETECTED Final   Staphylococcus lugdunensis NOT DETECTED NOT DETECTED Final   Streptococcus species DETECTED (A) NOT DETECTED Final    Comment: Not Enterococcus species, Streptococcus agalactiae, Streptococcus pyogenes, or Streptococcus pneumoniae. CRITICAL RESULT CALLED TO, READ BACK BY AND VERIFIED WITH: NATHAN BEULE 10/19/21 0602 MW    Streptococcus agalactiae NOT DETECTED NOT DETECTED Final   Streptococcus pneumoniae NOT DETECTED NOT DETECTED Final   Streptococcus pyogenes NOT DETECTED NOT DETECTED Final   A.calcoaceticus-baumannii NOT DETECTED NOT DETECTED Final   Bacteroides fragilis NOT DETECTED NOT DETECTED Final   Enterobacterales NOT DETECTED NOT DETECTED Final   Enterobacter cloacae complex NOT DETECTED NOT DETECTED Final   Escherichia coli NOT DETECTED NOT DETECTED Final   Klebsiella aerogenes NOT DETECTED NOT DETECTED Final    Klebsiella oxytoca NOT DETECTED NOT DETECTED Final  Klebsiella pneumoniae NOT DETECTED NOT DETECTED Final   Proteus species NOT DETECTED NOT DETECTED Final   Salmonella species NOT DETECTED NOT DETECTED Final   Serratia marcescens NOT DETECTED NOT DETECTED Final   Haemophilus influenzae NOT DETECTED NOT DETECTED Final   Neisseria meningitidis NOT DETECTED NOT DETECTED Final   Pseudomonas aeruginosa NOT DETECTED NOT DETECTED Final   Stenotrophomonas maltophilia NOT DETECTED NOT DETECTED Final   Candida albicans NOT DETECTED NOT DETECTED Final   Candida auris NOT DETECTED NOT DETECTED Final   Candida glabrata NOT DETECTED NOT DETECTED Final   Candida krusei NOT DETECTED NOT DETECTED Final   Candida parapsilosis NOT DETECTED NOT DETECTED Final   Candida tropicalis NOT DETECTED NOT DETECTED Final   Cryptococcus neoformans/gattii NOT DETECTED NOT DETECTED Final    Comment: Performed at [facility], [address] [place], [place] [address]  Resp Panel by RT-PCR (Flu A&B, Covid) Nasopharyngeal Swab     Status: None   Collection Time: 10/18/21  8:16 PM   Specimen: Nasopharyngeal Swab; Nasopharyngeal(NP) swabs in vial transport medium  Result Value Ref Range Status   SARS Coronavirus 2 by RT PCR NEGATIVE NEGATIVE Final    Comment: (NOTE) SARS-CoV-2 target nucleic acids are NOT DETECTED.  The SARS-CoV-2 RNA is generally detectable in upper respiratory specimens during the acute phase of infection. The lowest concentration of SARS-CoV-2 viral copies this assay can detect is 138 copies/mL. A negative result does not preclude SARS-Cov-2 infection and should not be used as the sole basis for treatment or other patient management decisions. A negative result may occur with  improper specimen collection/handling, submission of specimen other than nasopharyngeal swab, presence of viral mutation(s) within the areas targeted by this assay, and inadequate number of viral copies(<138  copies/mL). A negative result must be combined with clinical observations, patient history, and epidemiological information. The expected result is Negative.  Fact Sheet for Patients:  EntrepreneurPulse.com.au  Fact Sheet for Healthcare Providers:  IncredibleEmployment.be  This test is no t yet approved or cleared by the [place] FDA and  has been authorized for detection and/or diagnosis of SARS-CoV-2 by FDA under an Emergency Use Authorization (EUA). This EUA will remain  in effect (meaning this test can be used) for the duration of the COVID-19 declaration under Section 564(b)(1) of the Act, 21 U.S.C.section 360bbb-3(b)(1), unless the authorization is terminated  or revoked sooner.       Influenza A by PCR NEGATIVE NEGATIVE Final   Influenza B by PCR NEGATIVE NEGATIVE Final    Comment: (NOTE) The Xpert Xpress SARS-CoV-2/FLU/RSV plus assay is intended as an aid in the diagnosis of influenza from Nasopharyngeal swab specimens and should not be used as a sole basis for treatment. Nasal washings and aspirates are unacceptable for Xpert Xpress SARS-CoV-2/FLU/RSV testing.  Fact Sheet for Patients: EntrepreneurPulse.com.au  Fact Sheet for Healthcare Providers: IncredibleEmployment.be  This test is not yet approved or cleared by the [place] FDA and has been authorized for detection and/or diagnosis of SARS-CoV-2 by FDA under an Emergency Use Authorization (EUA). This EUA will remain in effect (meaning this test can be used) for the duration of the COVID-19 declaration under Section 564(b)(1) of the Act, 21 U.S.C. section 360bbb-3(b)(1), unless the authorization is terminated or revoked.  Performed at [facility], [address] [place], [place] [address]   Culture, blood (Routine X 2) w Reflex to ID Panel     Status: None (Preliminary result)   Collection Time: 10/18/21 11:28 PM  Specimen: BLOOD  Result Value Ref Range Status   Specimen Description BLOOD LEFT HAND  Final   Special Requests   Final    BOTTLES DRAWN AEROBIC ONLY Blood Culture adequate volume   Culture   Final    NO GROWTH 2 DAYS Performed at [facility], [address] [place], [place] [address]    Report Status PENDING  Incomplete  MRSA Next Gen by PCR, Nasal     Status: None   Collection Time: 10/19/21  5:04 AM   Specimen: Nasal Mucosa; Nasal Swab  Result Value Ref Range Status   MRSA by PCR Next Gen NOT DETECTED NOT DETECTED Final    Comment: (NOTE) The GeneXpert MRSA Assay (FDA approved for NASAL specimens only), is one component of a comprehensive MRSA colonization surveillance program. It is not intended to diagnose MRSA infection nor to guide or monitor treatment for MRSA infections. Test performance is not FDA approved in patients less than 56 years old. Performed at [facility], [address], [place], [place] [address]       Radiology Studies: CT ABDOMEN PELVIS WO CONTRAST  Result Date: 10/18/2021 CLINICAL DATA:  Abdominal pain, acute, nonlocalized. Found on floor. EXAM: CT ABDOMEN AND PELVIS WITHOUT CONTRAST TECHNIQUE: Multidetector CT imaging of the abdomen and pelvis was performed following the standard protocol without IV contrast. COMPARISON:  05/11/2015 FINDINGS: Lower chest: Small to moderate-sized hiatal hernia. Fluid in the distal esophagus may reflect reflux. No effusions. Coronary artery and aortic calcifications. Hepatobiliary: No focal hepatic abnormality. Gallbladder unremarkable. Pancreas: No focal abnormality or ductal dilatation. Spleen: Prior splenectomy Adrenals/Urinary Tract: Left kidney is atrophic. 5.3 cm cyst off the midpole of the left kidney. Bilateral nephrolithiasis. No hydronephrosis. Small scattered low-density lesions in the right kidney likely reflects cysts. Adrenal glands unremarkable. Foley catheter in the bladder which is  decompressed. Stomach/Bowel: Sigmoid diverticulosis. No active diverticulitis. Mild distention of the stomach. Small bowel is decompressed. Vascular/Lymphatic: Heavily calcified aorta and branch vessels. No aneurysm or adenopathy. Reproductive: No visible focal abnormality. Other: No free fluid or free air. Musculoskeletal: Old healed fractures of the left superior and inferior pubic rami. No acute bony abnormality. IMPRESSION: Bilateral nephrolithiasis.  No hydronephrosis. Small to moderate-sized hiatal hernia. Fluid in the distal esophagus may be related to reflux or dysmotility. Sigmoid diverticulosis.  No active diverticulitis. Heavily calcified aorta. Electronically Signed   By: Rolm Baptise M.D.   On: 10/18/2021 20:26   DG Chest 1 View  Result Date: 10/20/2021 CLINICAL DATA:  Shortness of breath EXAM: CHEST  1 VIEW COMPARISON:  10/19/2021 FINDINGS: Right IJ line with tip at the SVC. Generous lung volumes with mild interstitial coarsening attributed to COPD. Normal heart size and mediastinal contours. There is no edema, consolidation, effusion, or pneumothorax. IMPRESSION: No active disease. Electronically Signed   By: Jorje Guild M.D.   On: 10/20/2021 04:02   CT HEAD WO CONTRAST (5MM)  Result Date: 10/18/2021 CLINICAL DATA:  Head trauma, minor (Age >= 65y).  Found on floor. EXAM: CT HEAD WITHOUT CONTRAST TECHNIQUE: Contiguous axial images were obtained from the base of the skull through the vertex without intravenous contrast. 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 There is atrophy and chronic small vessel disease changes. No acute intracranial abnormality. Specifically, no hemorrhage, hydrocephalus, mass lesion, acute infarction, or significant intracranial injury. Vascular: No hyperdense vessel or unexpected calcification. Skull: No acute calvarial abnormality. 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 Atrophy, chronic microvascular disease. No acute intracranial abnormality. Electronically Signed   By: Rolm Baptise M.D.   On: 10/18/2021 20:18   CT Cervical Spine Wo Contrast  Result Date: 10/18/2021 CLINICAL DATA:  Neck trauma (Age >= 65y).  Found on floor. EXAM: CT CERVICAL SPINE WITHOUT CONTRAST TECHNIQUE: Multidetector CT imaging of the cervical spine was performed without intravenous contrast. Multiplanar CT image reconstructions were also generated. COMPARISON:  03/14/2019 FINDINGS: Alignment: No subluxation. Skull base and vertebrae: No acute fracture. No primary bone lesion or focal pathologic process. Soft tissues and spinal canal: No prevertebral fluid or swelling. No visible canal hematoma. Disc levels: Degenerative disc disease at C5-6 and C6-7 with disc space narrowing and spurring. Mild to moderate degenerative facet disease bilaterally, right greater than left. Upper chest: No acute findings Other: None IMPRESSION: No acute bony abnormality. Electronically Signed   By: Rolm Baptise M.D.   On: 10/18/2021 20:20   CT SHOULDER RIGHT WO CONTRAST  Result Date: 10/18/2021 CLINICAL DATA:  Proximal humeral fracture. EXAM: CT OF THE UPPER RIGHT EXTREMITY WITHOUT CONTRAST TECHNIQUE: Multidetector CT imaging of the upper right extremity was performed according to the standard protocol. COMPARISON:  Radiograph dated 10/18/2021. FINDINGS: Bones/Joint/Cartilage There is a displaced and comminuted fracture of the proximal right humerus involving the humeral neck and proximal humeral diaphysis. Multiple displaced fracture fragments noted. There is proximal migration of the humeral shaft in relation to the head of the humerus. There is lateral positioning of the humeral diaphysis in relation to the head of the humerus. There is comminuted fractures of the humeral head. The bones are osteopenic. There is no dislocation. Ligaments Suboptimally assessed by CT. Muscles and Tendons  There is intramuscular edema. No fluid collection or large hematoma. Soft tissues Diffuse subcutaneous edema of the right upper extremity and right axilla. No fluid collection or large hematoma. IMPRESSION: 1. Displaced and comminuted fracture of the proximal right humerus involving the humeral neck and proximal humeral diaphysis. 2. Comminuted fracture of the humeral head. Electronically Signed   By: Anner Crete M.D.   On: 10/18/2021 23:03   DG Chest Port 1 View  Result Date: 10/19/2021 CLINICAL DATA:  Central line placement EXAM: PORTABLE CHEST 1 VIEW COMPARISON:  10/18/2021 FINDINGS: Right central line tip in the SVC. No pneumothorax. Heart and mediastinal contours are within normal limits. No focal opacities or effusions. No acute bony abnormality. IMPRESSION: Right central line tip in the SVC.  No pneumothorax. No active cardiopulmonary disease. Electronically Signed   By: Rolm Baptise M.D.   On: 10/19/2021 01:20   DG Chest Portable 1 View  Result Date: 10/18/2021 CLINICAL DATA:  Shortness of breath.  Right upper arm bruising EXAM: PORTABLE CHEST 1 VIEW COMPARISON:  03/15/2019 FINDINGS: Heart size within normal limits. Atherosclerotic calcification of the aortic knob. No focal airspace consolidation, pleural effusion, or pneumothorax. Chronic left-sided rib fractures. Remote ununited left clavicular fracture. Acute comminuted, displaced, and angulated proximal right humeral fracture. IMPRESSION: 1. No active cardiopulmonary disease. 2. Acute comminuted, displaced, and angulated proximal right humeral fracture. Electronically Signed   By: Davina Poke D.O.   On: 10/18/2021 18:20   DG Humerus Right  Result Date: 10/18/2021 CLINICAL DATA:  Right upper extremity bruising.  Found down. EXAM: RIGHT HUMERUS - 2+ VIEW COMPARISON:  None. FINDINGS: Acute comminuted fracture of the proximal right humerus with approximately 90 degrees of varus angulation. Dominant butterfly fragment measuring up to 7.5  cm which is  medially displaced relative to the humeral shaft. Humeral head appears to be in articulation with the glenoid without evidence of dislocation. AC joint intact. Degenerative changes of the elbow. Prominent soft tissue swelling the fracture site. IMPRESSION: Acute comminuted, displaced, and angulated fracture of the proximal right humerus as described. Electronically Signed   By: Davina Poke D.O.   On: 10/18/2021 18:22   ECHOCARDIOGRAM COMPLETE  Result Date: 10/19/2021    ECHOCARDIOGRAM REPORT   Patient Name:   Edward Singleton Date of Exam: 10/19/2021 Medical Rec #:  [id number]       Height:       68.0 in Accession #:    [id number]      Weight:       160.0 lb Date of Birth:  [birth year]        BSA:          1.859 m Patient Age:    68 years        BP:           121/62 mmHg Patient Gender: M               HR:           80 bpm. Exam Location:  [facility] Procedure: 2D Echo, Color Doppler and Cardiac Doppler Indications:     I42.9 Cardiomyopathy-unspecified  History:         Patient has no prior history of Echocardiogram examinations.                  Risk Factors:Hypertension, Diabetes, HCL and Current Smoker. Hx                  of alcohol abuse.  Sonographer:     Charmayne Sheer Referring Phys:  [id number] BRITTON L RUST-CHESTER Diagnosing Phys: Harrell Gave End MD  Sonographer Comments: Technically difficult study due to poor echo windows and no apical window. IMPRESSIONS  1. Left ventricular ejection fraction, by estimation, is >55%. The left ventricle has normal function. Left ventricular endocardial border not optimally defined to evaluate regional wall motion. Left ventricular diastolic parameters are indeterminate.  2. Right ventricular systolic function is normal. The right ventricular size is normal. Tricuspid regurgitation signal is inadequate for assessing PA pressure.  3. The mitral valve was not well visualized. Unable to accurately assess mitral valve regurgitation.  4. The aortic valve is tricuspid.  There is mild calcification of the aortic valve. There is mild thickening of the aortic valve. Aortic valve regurgitation not well assessed. Aortic valve gradient could not be assessed.  5. The inferior vena cava is normal in size with <50% respiratory variability, suggesting right atrial pressure of 8 mmHg. FINDINGS  Left Ventricle: Left ventricular ejection fraction, by estimation, is >55%. The left ventricle has normal function. Left ventricular endocardial border not optimally defined to evaluate regional wall motion. The left ventricular internal cavity size was  normal in size. There is no left ventricular hypertrophy. Left ventricular diastolic parameters are indeterminate. Right Ventricle: The right ventricular size is normal. No increase in right ventricular wall thickness. Right ventricular systolic function is normal. Tricuspid regurgitation signal is inadequate for assessing PA pressure. Left Atrium: Left atrial size was not well visualized. Right Atrium: Right atrial size was not well visualized. Pericardium: There is no evidence of pericardial effusion. Mitral Valve: The mitral valve was not well visualized. Mild mitral annular calcification. Unable to accurately assess mitral valve regurgitation. Tricuspid Valve: The tricuspid valve is not well visualized. Tricuspid valve  regurgitation is trivial. Aortic Valve: The aortic valve is tricuspid. There is mild calcification of the aortic valve. There is mild thickening of the aortic valve. Aortic valve regurgitation not well assessed. Aortic valve gradient could not be assessed. Pulmonic Valve: The pulmonic valve was not well visualized. Pulmonic valve regurgitation is not visualized. No evidence of pulmonic stenosis. Aorta: The aortic root is normal in size and structure. Pulmonary Artery: The pulmonary artery is not well seen. Venous: The inferior vena cava is normal in size with less than 50% respiratory variability, suggesting right atrial pressure of  8 mmHg. IAS/Shunts: No atrial level shunt detected by color flow Doppler.  LEFT VENTRICLE PLAX 2D LVIDd:         3.83 cm LVIDs:         2.75 cm LV PW:         0.88 cm LV IVS:        0.73 cm LVOT diam:     2.40 cm LVOT Area:     4.52 cm  LEFT ATRIUM         Index LA diam:    3.80 cm 2.04 cm/m                        PULMONIC VALVE AORTA                 PV Vmax:       1.09 m/s Ao Root diam: 3.70 cm PV Vmean:      72.300 cm/s                       PV VTI:        0.185 m                       PV Peak grad:  4.8 mmHg                       PV Mean grad:  2.0 mmHg   SHUNTS Systemic Diam: 2.40 cm Nelva Bush MD Electronically signed by Nelva Bush MD Signature Date/Time: 10/19/2021/3:40:12 PM    Final       Scheduled Meds:  budesonide (PULMICORT) nebulizer solution  0.25 mg Nebulization BID   Chlorhexidine Gluconate Cloth  6 each Topical Q7341   folic acid  1 mg Oral Daily   heparin  5,000 Units Subcutaneous Q8H   insulin aspart  0-15 Units Subcutaneous Q4H   ipratropium-albuterol  3 mL Nebulization Q6H   LORazepam  0-4 mg Intravenous Q4H   Followed by   LORazepam  0-4 mg Intravenous Q8H   multivitamin with minerals  1 tablet Oral Daily   pantoprazole  40 mg Oral QHS   spiritus frumenti  1 each Oral TID AC   thiamine  100 mg Oral Daily   Or   thiamine  100 mg Intravenous Daily   Continuous Infusions:  sodium chloride Stopped (10/19/21 0149)   sodium chloride     cefTRIAXone (ROCEPHIN)  IV     sodium bicarbonate 150 mEq in D5W infusion Stopped (10/20/21 0300)     LOS: 2 days      Time spent: 25 minutes   Dessa Phi, DO [facility] 10/20/2021, 1:55 PM   Available via Epic secure chat 7am-7pm After these hours, please refer to coverage provider listed on [email or web address]

## 2021-10-20 NOTE — Progress Notes (Signed)
Date of Admission:  10/18/2021     ID: Edward Singleton is a 71 y.o. male   Principal Problem:   Rhabdomyolysis Active Problems:   AKI (acute kidney injury) ([place])   Metabolic acidosis   Shock ([place])    Subjective: Says he is feeling down today C/o pain rt arm and shoulder  Medications:   budesonide (PULMICORT) nebulizer solution  0.25 mg Nebulization BID   Chlorhexidine Gluconate Cloth  6 each Topical A4166   folic acid  1 mg Oral Daily   heparin  5,000 Units Subcutaneous Q8H   insulin aspart  0-15 Units Subcutaneous Q4H   ipratropium-albuterol  3 mL Nebulization Q6H   LORazepam  0-4 mg Intravenous Q4H   Followed by   LORazepam  0-4 mg Intravenous Q8H   multivitamin with minerals  1 tablet Oral Daily   pantoprazole  40 mg Oral QHS   spiritus frumenti  1 each Oral TID AC   thiamine  100 mg Oral Daily   Or   thiamine  100 mg Intravenous Daily    Objective: Vital signs in last 24 hours: Temp:  [99.1 F (37.3 C)-100 F (37.8 C)] 99.1 F (37.3 C) (01/11 1045) Pulse Rate:  [78-102] 94 (01/11 1045) Resp:  [15-26] 15 (01/11 1045) BP: (108-143)/(59-78) 108/64 (01/11 1000) SpO2:  [86 %-99 %] 94 % (01/11 1045) Weight:  [72.5 kg] 72.5 kg (01/11 0500)  PHYSICAL EXAM:  General: Alert, cooperative, some  distress, appears stated age.  Lungs: b/l air entry- crepts bases Heart: Tachycardia Abdomen: Soft, non-tender,not distended. Bowel sounds normal. No masses Extremities: rt upper extremity  Ecchymosis extending to the forearm today Skin: No rashes or lesions. Or bruising Lymph: Cervical, supraclavicular normal. Neurologic: Grossly non-focal  Lab Results Recent Labs    10/19/21 0504 10/19/21 1402 10/20/21 0650  WBC 18.4*  --  16.6*  HGB 8.6*  --  7.6*  HCT 24.0*  --  22.0*  NA 130* 133* 138  K 2.8* 3.1*   3.1* 3.0*  CL 92* 94* 96*  CO2 23 31 32  BUN 59* 48* 35*  CREATININE 3.32* 2.56* 2.18*   Liver Panel Recent Labs    10/19/21 0504 10/20/21 0650  PROT  5.3* 5.1*  ALBUMIN 3.0* 2.7*  AST 46* 42*  ALT 21 23  ALKPHOS 83 73  BILITOT 1.0 1.1  BILIDIR  --  0.2  IBILI  --  0.9     Microbiology: 1/9 1 set strep gallolyticus Staph epi- likely contaminant Studies/Results: CT ABDOMEN PELVIS WO CONTRAST  Result Date: 10/18/2021 CLINICAL DATA:  Abdominal pain, acute, nonlocalized. Found on floor. EXAM: CT ABDOMEN AND PELVIS WITHOUT CONTRAST TECHNIQUE: Multidetector CT imaging of the abdomen and pelvis was performed following the standard protocol without IV contrast. COMPARISON:  05/11/2015 FINDINGS: Lower chest: Small to moderate-sized hiatal hernia. Fluid in the distal esophagus may reflect reflux. No effusions. Coronary artery and aortic calcifications. Hepatobiliary: No focal hepatic abnormality. Gallbladder unremarkable. Pancreas: No focal abnormality or ductal dilatation. Spleen: Prior splenectomy Adrenals/Urinary Tract: Left kidney is atrophic. 5.3 cm cyst off the midpole of the left kidney. Bilateral nephrolithiasis. No hydronephrosis. Small scattered low-density lesions in the right kidney likely reflects cysts. Adrenal glands unremarkable. Foley catheter in the bladder which is decompressed. Stomach/Bowel: Sigmoid diverticulosis. No active diverticulitis. Mild distention of the stomach. Small bowel is decompressed. Vascular/Lymphatic: Heavily calcified aorta and branch vessels. No aneurysm or adenopathy. Reproductive: No visible focal abnormality. Other: No free fluid or free air. Musculoskeletal:  Old healed fractures of the left superior and inferior pubic rami. No acute bony abnormality. IMPRESSION: Bilateral nephrolithiasis.  No hydronephrosis. Small to moderate-sized hiatal hernia. Fluid in the distal esophagus may be related to reflux or dysmotility. Sigmoid diverticulosis.  No active diverticulitis. Heavily calcified aorta. Electronically Signed   By: Rolm Baptise M.D.   On: 10/18/2021 20:26   DG Chest 1 View  Result Date: 10/20/2021 CLINICAL  DATA:  Shortness of breath EXAM: CHEST  1 VIEW COMPARISON:  10/19/2021 FINDINGS: Right IJ line with tip at the SVC. Generous lung volumes with mild interstitial coarsening attributed to COPD. Normal heart size and mediastinal contours. There is no edema, consolidation, effusion, or pneumothorax. IMPRESSION: No active disease. Electronically Signed   By: Jorje Guild M.D.   On: 10/20/2021 04:02   CT HEAD WO CONTRAST (5MM)  Result Date: 10/18/2021 CLINICAL DATA:  Head trauma, minor (Age >= 65y).  Found on floor. EXAM: CT HEAD WITHOUT CONTRAST TECHNIQUE: Contiguous axial images were obtained from the base of the skull through the vertex without intravenous contrast. 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 There is atrophy and chronic small vessel disease changes. No acute intracranial abnormality. Specifically, no hemorrhage, hydrocephalus, mass lesion, acute infarction, or significant intracranial injury. Vascular: No hyperdense vessel or unexpected calcification. Skull: No acute calvarial abnormality. 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 Atrophy, chronic microvascular disease. No acute intracranial abnormality. Electronically Signed   By: Rolm Baptise M.D.   On: 10/18/2021 20:18   CT Cervical Spine Wo Contrast  Result Date: 10/18/2021 CLINICAL DATA:  Neck trauma (Age >= 65y).  Found on floor. EXAM: CT CERVICAL SPINE WITHOUT CONTRAST TECHNIQUE: Multidetector CT imaging of the cervical spine was performed without intravenous contrast. Multiplanar CT image reconstructions were also generated. COMPARISON:  03/14/2019 FINDINGS: Alignment: No subluxation. Skull base and vertebrae: No acute fracture. No primary bone lesion or focal pathologic process. Soft tissues and spinal canal: No prevertebral fluid or swelling. No visible canal hematoma. Disc levels: Degenerative disc disease at C5-6 and  C6-7 with disc space narrowing and spurring. Mild to moderate degenerative facet disease bilaterally, right greater than left. Upper chest: No acute findings Other: None IMPRESSION: No acute bony abnormality. Electronically Signed   By: Rolm Baptise M.D.   On: 10/18/2021 20:20   CT SHOULDER RIGHT WO CONTRAST  Result Date: 10/18/2021 CLINICAL DATA:  Proximal humeral fracture. EXAM: CT OF THE UPPER RIGHT EXTREMITY WITHOUT CONTRAST TECHNIQUE: Multidetector CT imaging of the upper right extremity was performed according to the standard protocol. COMPARISON:  Radiograph dated 10/18/2021. FINDINGS: Bones/Joint/Cartilage There is a displaced and comminuted fracture of the proximal right humerus involving the humeral neck and proximal humeral diaphysis. Multiple displaced fracture fragments noted. There is proximal migration of the humeral shaft in relation to the head of the humerus. There is lateral positioning of the humeral diaphysis in relation to the head of the humerus. There is comminuted fractures of the humeral head. The bones are osteopenic. There is no dislocation. Ligaments Suboptimally assessed by CT. Muscles and Tendons There is intramuscular edema. No fluid collection or large hematoma. Soft tissues Diffuse subcutaneous edema of the right upper extremity and right axilla. No fluid collection or large hematoma. IMPRESSION: 1. Displaced and comminuted fracture of the proximal right humerus involving the humeral neck and proximal humeral diaphysis. 2. Comminuted fracture of the humeral head. Electronically Signed   By: Milas Hock  Radparvar M.D.   On: 10/18/2021 23:03   DG Chest Port 1 View  Result Date: 10/19/2021 CLINICAL DATA:  Central line placement EXAM: PORTABLE CHEST 1 VIEW COMPARISON:  10/18/2021 FINDINGS: Right central line tip in the SVC. No pneumothorax. Heart and mediastinal contours are within normal limits. No focal opacities or effusions. No acute bony abnormality. IMPRESSION: Right central  line tip in the SVC.  No pneumothorax. No active cardiopulmonary disease. Electronically Signed   By: Rolm Baptise M.D.   On: 10/19/2021 01:20   DG Chest Portable 1 View  Result Date: 10/18/2021 CLINICAL DATA:  Shortness of breath.  Right upper arm bruising EXAM: PORTABLE CHEST 1 VIEW COMPARISON:  03/15/2019 FINDINGS: Heart size within normal limits. Atherosclerotic calcification of the aortic knob. No focal airspace consolidation, pleural effusion, or pneumothorax. Chronic left-sided rib fractures. Remote ununited left clavicular fracture. Acute comminuted, displaced, and angulated proximal right humeral fracture. IMPRESSION: 1. No active cardiopulmonary disease. 2. Acute comminuted, displaced, and angulated proximal right humeral fracture. Electronically Signed   By: Davina Poke D.O.   On: 10/18/2021 18:20   DG Humerus Right  Result Date: 10/18/2021 CLINICAL DATA:  Right upper extremity bruising.  Found down. EXAM: RIGHT HUMERUS - 2+ VIEW COMPARISON:  None. FINDINGS: Acute comminuted fracture of the proximal right humerus with approximately 90 degrees of varus angulation. Dominant butterfly fragment measuring up to 7.5 cm which is medially displaced relative to the humeral shaft. Humeral head appears to be in articulation with the glenoid without evidence of dislocation. AC joint intact. Degenerative changes of the elbow. Prominent soft tissue swelling the fracture site. IMPRESSION: Acute comminuted, displaced, and angulated fracture of the proximal right humerus as described. Electronically Signed   By: Davina Poke D.O.   On: 10/18/2021 18:22   ECHOCARDIOGRAM COMPLETE  Result Date: 10/19/2021    ECHOCARDIOGRAM REPORT   Patient Name:   LASHAN GLUTH Date of Exam: 10/19/2021 Medical Rec #:  [id number]       Height:       68.0 in Accession #:    [id number]      Weight:       160.0 lb Date of Birth:  06-05-1951        BSA:          1.859 m Patient Age:    50 years        BP:           121/62 mmHg  Patient Gender: M               HR:           80 bpm. Exam Location:  [facility] Procedure: 2D Echo, Color Doppler and Cardiac Doppler Indications:     I42.9 Cardiomyopathy-unspecified  History:         Patient has no prior history of Echocardiogram examinations.                  Risk Factors:Hypertension, Diabetes, HCL and Current Smoker. Hx                  of alcohol abuse.  Sonographer:     Charmayne Sheer Referring Phys:  [id number] BRITTON L [place] Diagnosing Phys: Harrell Gave End MD  Sonographer Comments: Technically difficult study due to poor echo windows and no apical window. IMPRESSIONS  1. Left ventricular ejection fraction, by estimation, is >55%. The left ventricle has normal function. Left ventricular endocardial border not optimally defined to evaluate regional wall motion. Left  ventricular diastolic parameters are indeterminate.  2. Right ventricular systolic function is normal. The right ventricular size is normal. Tricuspid regurgitation signal is inadequate for assessing PA pressure.  3. The mitral valve was not well visualized. Unable to accurately assess mitral valve regurgitation.  4. The aortic valve is tricuspid. There is mild calcification of the aortic valve. There is mild thickening of the aortic valve. Aortic valve regurgitation not well assessed. Aortic valve gradient could not be assessed.  5. The inferior vena cava is normal in size with <50% respiratory variability, suggesting right atrial pressure of 8 mmHg. FINDINGS  Left Ventricle: Left ventricular ejection fraction, by estimation, is >55%. The left ventricle has normal function. Left ventricular endocardial border not optimally defined to evaluate regional wall motion. The left ventricular internal cavity size was  normal in size. There is no left ventricular hypertrophy. Left ventricular diastolic parameters are indeterminate. Right Ventricle: The right ventricular size is normal. No increase in right ventricular wall thickness. Right  ventricular systolic function is normal. Tricuspid regurgitation signal is inadequate for assessing PA pressure. Left Atrium: Left atrial size was not well visualized. Right Atrium: Right atrial size was not well visualized. Pericardium: There is no evidence of pericardial effusion. Mitral Valve: The mitral valve was not well visualized. Mild mitral annular calcification. Unable to accurately assess mitral valve regurgitation. Tricuspid Valve: The tricuspid valve is not well visualized. Tricuspid valve regurgitation is trivial. Aortic Valve: The aortic valve is tricuspid. There is mild calcification of the aortic valve. There is mild thickening of the aortic valve. Aortic valve regurgitation not well assessed. Aortic valve gradient could not be assessed. Pulmonic Valve: The pulmonic valve was not well visualized. Pulmonic valve regurgitation is not visualized. No evidence of pulmonic stenosis. Aorta: The aortic root is normal in size and structure. Pulmonary Artery: The pulmonary artery is not well seen. Venous: The inferior vena cava is normal in size with less than 50% respiratory variability, suggesting right atrial pressure of 8 mmHg. IAS/Shunts: No atrial level shunt detected by color flow Doppler.  LEFT VENTRICLE PLAX 2D LVIDd:         3.83 cm LVIDs:         2.75 cm LV PW:         0.88 cm LV IVS:        0.73 cm LVOT diam:     2.40 cm LVOT Area:     4.52 cm  LEFT ATRIUM         Index LA diam:    3.80 cm 2.04 cm/m                        PULMONIC VALVE AORTA                 PV Vmax:       1.09 m/s Ao Root diam: 3.70 cm PV Vmean:      72.300 cm/s                       PV VTI:        0.185 m                       PV Peak grad:  4.8 mmHg                       PV Mean grad:  2.0 mmHg   SHUNTS Systemic Diam: 2.40  cm Nelva Bush MD Electronically signed by Nelva Bush MD Signature Date/Time: 10/19/2021/3:40:12 PM    Final      Assessment/Plan: Fall following alcohol consumption. Complicated fracture of  the right humerus Severe bruising/ecchymosis of the right arm and shoulder with severe pain.  Watch for secondary infection.  Hypoxia- watch for fat emboli Circulatory shock secondary to alcoholic ketosis, hypothermia and infection Received pressure support but now stable  Streptococcus Galloyticus bacteremia CT scan done on 10/18/2021 showed displaced and comminuted fracture of the proximal right humerus and comminuted fracture of the humeral head.  Diffuse subcutaneous edema of the right upper extremity and right axilla.  No fluid collection or large hematoma. Watch closely for development of any infection including abscess necrosis. Pt on ceftriaxone Repeat blood culture 2 d echo is not a good study This bacteria can be associated with Colon malignancy and has the potential to cause endocarditis   History of splenectomy.  Rhabdomyolysis improving.  Getting fluids   AKI on CKD.  Secondary to rhabdo, prerenal and infection Improving Nephrology on board     Diabetes mellitus on sliding scale    Discussed the management with the patient and care team

## 2021-10-20 NOTE — Progress Notes (Signed)
Pt has been restless on shift, oxygen dropped to the 80's several times, NP Ouma made aware. Duoneb administered per order, O2 increased to 5L via [place], ABG and chest x-ray ordered; see results. Sodium bicarbonate infusion was DC at beginning of shift by NP Ouma and reordered by MD Lateef. IVFs stopped at 0300 per NP verbal order after ABG was resulted. RIJ CVP line readings do not seem accurate on shift, several nurses, including the charge nurse attempted to fix the line.Pt appears to be resting at this time.

## 2021-10-21 DIAGNOSIS — R7881 Bacteremia: Secondary | ICD-10-CM | POA: Diagnosis not present

## 2021-10-21 DIAGNOSIS — N179 Acute kidney failure, unspecified: Secondary | ICD-10-CM | POA: Diagnosis not present

## 2021-10-21 DIAGNOSIS — S4291XA Fracture of right shoulder girdle, part unspecified, initial encounter for closed fracture: Secondary | ICD-10-CM | POA: Diagnosis not present

## 2021-10-21 DIAGNOSIS — D72829 Elevated white blood cell count, unspecified: Secondary | ICD-10-CM | POA: Diagnosis not present

## 2021-10-21 DIAGNOSIS — T796XXA Traumatic ischemia of muscle, initial encounter: Secondary | ICD-10-CM | POA: Diagnosis not present

## 2021-10-21 LAB — BASIC METABOLIC PANEL
Anion gap: 9 (ref 5–15)
BUN: 27 mg/dL — ABNORMAL HIGH (ref 8–23)
CO2: 29 mmol/L (ref 22–32)
Calcium: 7.9 mg/dL — ABNORMAL LOW (ref 8.9–10.3)
Chloride: 94 mmol/L — ABNORMAL LOW (ref 98–111)
Creatinine, Ser: 1.93 mg/dL — ABNORMAL HIGH (ref 0.61–1.24)
GFR, Estimated: 37 mL/min — ABNORMAL LOW (ref >60)
Glucose, Bld: 161 mg/dL — ABNORMAL HIGH (ref 70–99)
Potassium: 3.2 mmol/L — ABNORMAL LOW (ref 3.5–5.1)
Sodium: 132 mmol/L — ABNORMAL LOW (ref 135–145)

## 2021-10-21 LAB — CBC
HCT: 21.6 % — ABNORMAL LOW (ref 39.0–52.0)
Hemoglobin: 7.2 g/dL — ABNORMAL LOW (ref 13.0–17.0)
MCH: 33.3 pg (ref 26.0–34.0)
MCHC: 33.3 g/dL (ref 30.0–36.0)
MCV: 100 fL (ref 80.0–100.0)
Platelets: 200 10*3/uL (ref 150–400)
RBC: 2.16 MIL/uL — ABNORMAL LOW (ref 4.22–5.81)
RDW: 15 % (ref 11.5–15.5)
WBC: 18.6 10*3/uL — ABNORMAL HIGH (ref 4.0–10.5)
nRBC: 0.2 % (ref 0.0–0.2)

## 2021-10-21 LAB — GLUCOSE, CAPILLARY
Glucose-Capillary: 126 mg/dL — ABNORMAL HIGH (ref 70–99)
Glucose-Capillary: 146 mg/dL — ABNORMAL HIGH (ref 70–99)
Glucose-Capillary: 102 mg/dL — ABNORMAL HIGH (ref 70–99)

## 2021-10-21 LAB — CK: Total CK: 861 U/L — ABNORMAL HIGH (ref 49–397)

## 2021-10-21 LAB — PREPARE RBC (CROSSMATCH)

## 2021-10-21 MED ORDER — SODIUM CHLORIDE 0.9% IV SOLUTION: Freq: Once | INTRAVENOUS | Status: AC

## 2021-10-21 MED ORDER — NICOTINE 14 MG/24HR TD PT24
14.0000 mg | MEDICATED_PATCH | Freq: Every day | TRANSDERMAL | Status: DC
  Administered 2021-10-21 – 2021-10-26 (×4): 14 mg via TRANSDERMAL
  Filled 2021-10-21 (×5): qty 1

## 2021-10-21 MED ORDER — POTASSIUM CHLORIDE CRYS ER 20 MEQ PO TBCR
40.0000 meq | EXTENDED_RELEASE_TABLET | Freq: Two times a day (BID) | ORAL | Status: AC
  Administered 2021-10-21 (×2): 40 meq via ORAL
  Filled 2021-10-21 (×2): qty 2

## 2021-10-21 NOTE — Progress Notes (Signed)
[facility]  ROUNDING NOTE   Subjective:   Edward Singleton is a 71 year old male with past medical history of alcohol abuse, diabetes, hypertension, gout, and chronic kidney disease stage IV.  Patient presents to the emergency department after being found down at his home, suspected for several days.  Patient is admitted for Rhabdomyolysis [M62.82]   Patient resting quietly Alert Requesting surgery on shoulder Oxygen gradually increased to 5L in past 24 hours, denies shortness of breath  Creatinine continues to improve UOP 2.6L in last 24 hours Remains on CIWA protocol  Objective:  Vital signs in last 24 hours:  Temp:  [99.1 F (37.3 C)-100.6 F (38.1 C)] 99.9 F (37.7 C) (01/12 0400) Pulse Rate:  [83-106] 106 (01/12 0800) Resp:  [15-28] 16 (01/12 0400) BP: (98-141)/(58-110) 141/81 (01/12 0800) SpO2:  [87 %-96 %] 94 % (01/12 0400) Weight:  [66.3 kg] 66.3 kg (01/12 0500)  Weight change: -6.2 kg Filed Weights   10/18/21 1839 10/20/21 0500 10/21/21 0500  Weight: 72.6 kg 72.5 kg 66.3 kg    Intake/Output: I/O last 3 completed shifts: In: 800 [P.O.:600; IV Piggyback:200] Out: 6767 [Urine:4295; Stool:1]   Intake/Output this shift:  Total I/O In: 120 [P.O.:120] Out: 235 [Urine:235]  Physical Exam: General: NAD  Head: Normocephalic, atraumatic. Moist oral mucosal membranes  Eyes: Anicteric  Lungs:  Clear to auscultation, normal effort, 5L [place]  Heart: Regular rate and rhythm  Abdomen:  Soft, nontender  Extremities:  no peripheral edema.  Neurologic: Nonfocal, moving all four extremities  Skin: RUE ecchymosis       Basic Metabolic Panel: Recent Labs  Lab 10/18/21 1800 10/18/21 2016 10/19/21 0504 10/19/21 1402 10/20/21 0650 10/21/21 0534  NA 134* 134* 130* 133* 138 132*  K 3.5 3.5 2.8* 3.1*   3.1* 3.0* 3.2*  CL 91* 100 92* 94* 96* 94*  CO2 9* 10* 23 31 32 29  GLUCOSE 170* 192* 214* 122* 142* 161*  BUN 64* 59* 59* 48* 35* 27*  CREATININE 4.30*  3.68* 3.32* 2.56* 2.18* 1.93*  CALCIUM 8.1* 6.7* 7.5* 7.5* 8.0* 7.9*  MG 2.6*  --  1.7  --   --   --   PHOS  --  7.6* 4.9*  --   --   --      Liver Function Tests: Recent Labs  Lab 10/18/21 1800 10/19/21 0504 10/20/21 0650  AST 37 46* 42*  ALT 19 21 23   ALKPHOS 100 83 73  BILITOT 1.0 1.0 1.1  PROT 6.2* 5.3* 5.1*  ALBUMIN 3.4* 3.0* 2.7*    Recent Labs  Lab 10/18/21 1800 10/19/21 0504  LIPASE 57* 112*    No results for input(s): AMMONIA in the last 168 hours.  CBC: Recent Labs  Lab 10/18/21 1800 10/19/21 0504 10/20/21 0650 10/21/21 0534  WBC 24.3* 18.4* 16.6* 18.6*  NEUTROABS 20.4*  --   --   --   HGB 11.3* 8.6* 7.6* 7.2*  HCT 34.4* 24.0* 22.0* 21.6*  MCV 104.9* 94.5 97.3 100.0  PLT 275 240 180 200     Cardiac Enzymes: Recent Labs  Lab 10/18/21 1800 10/19/21 0504 10/20/21 0650 10/21/21 0534  CKTOTAL 990* 2,341* 1,503* 861*     BNP: Invalid input(s): POCBNP  CBG: Recent Labs  Lab 10/20/21 0740 10/20/21 1118 10/20/21 1530 10/20/21 2125 10/21/21 0732  GLUCAP 134* 152* 125* 105* 126*     Microbiology: Results for orders placed or performed during the hospital encounter of 10/18/21  Blood culture (routine x 2)  Status: Abnormal (Preliminary result)   Collection Time: 10/18/21  6:24 PM   Specimen: BLOOD  Result Value Ref Range Status   Specimen Description   Final    BLOOD FOOT Performed at [facility], [address] [place], [place] [address]    Special Requests   Final    BOTTLES DRAWN AEROBIC AND ANAEROBIC BCAV Performed at [facility], [address] [place], [place] [address]    Culture  Setup Time   Final    Organism ID to follow IN BOTH AEROBIC AND ANAEROBIC BOTTLES GRAM POSITIVE COCCI CRITICAL RESULT CALLED TO, READ BACK BY AND VERIFIED WITH: NATHAN BEULE 10/19/21 0602 MW    Culture (A)  Final    STREPTOCOCCUS GALLOLYTICUS STAPHYLOCOCCUS EPIDERMIDIS THE SIGNIFICANCE OF ISOLATING THIS ORGANISM  FROM A SINGLE SET OF BLOOD CULTURES WHEN MULTIPLE SETS ARE DRAWN IS UNCERTAIN. PLEASE NOTIFY THE MICROBIOLOGY DEPARTMENT WITHIN ONE WEEK IF SPECIATION AND SENSITIVITIES ARE REQUIRED. CRITICAL RESULT CALLED TO, READ BACK BY AND VERIFIED WITH: Duncan. [id number] FCP Performed at [facility], [address], [place], [place] [address]    Report Status PENDING  Incomplete  Blood Culture ID Panel (Reflexed)     Status: Abnormal   Collection Time: 10/18/21  6:24 PM  Result Value Ref Range Status   Enterococcus faecalis NOT DETECTED NOT DETECTED Final   Enterococcus Faecium NOT DETECTED NOT DETECTED Final   Listeria monocytogenes NOT DETECTED NOT DETECTED Final   Staphylococcus species NOT DETECTED NOT DETECTED Final   Staphylococcus aureus (BCID) NOT DETECTED NOT DETECTED Final   Staphylococcus epidermidis NOT DETECTED NOT DETECTED Final   Staphylococcus lugdunensis NOT DETECTED NOT DETECTED Final   Streptococcus species DETECTED (A) NOT DETECTED Final    Comment: Not Enterococcus species, Streptococcus agalactiae, Streptococcus pyogenes, or Streptococcus pneumoniae. CRITICAL RESULT CALLED TO, READ BACK BY AND VERIFIED WITH: NATHAN BEULE 10/19/21 0602 MW    Streptococcus agalactiae NOT DETECTED NOT DETECTED Final   Streptococcus pneumoniae NOT DETECTED NOT DETECTED Final   Streptococcus pyogenes NOT DETECTED NOT DETECTED Final   A.calcoaceticus-baumannii NOT DETECTED NOT DETECTED Final   Bacteroides fragilis NOT DETECTED NOT DETECTED Final   Enterobacterales NOT DETECTED NOT DETECTED Final   Enterobacter cloacae complex NOT DETECTED NOT DETECTED Final   Escherichia coli NOT DETECTED NOT DETECTED Final   Klebsiella aerogenes NOT DETECTED NOT DETECTED Final   Klebsiella oxytoca NOT DETECTED NOT DETECTED Final   Klebsiella pneumoniae NOT DETECTED NOT DETECTED Final   Proteus species NOT DETECTED NOT DETECTED Final   Salmonella species NOT DETECTED NOT DETECTED Final   Serratia  marcescens NOT DETECTED NOT DETECTED Final   Haemophilus influenzae NOT DETECTED NOT DETECTED Final   Neisseria meningitidis NOT DETECTED NOT DETECTED Final   Pseudomonas aeruginosa NOT DETECTED NOT DETECTED Final   Stenotrophomonas maltophilia NOT DETECTED NOT DETECTED Final   Candida albicans NOT DETECTED NOT DETECTED Final   Candida auris NOT DETECTED NOT DETECTED Final   Candida glabrata NOT DETECTED NOT DETECTED Final   Candida krusei NOT DETECTED NOT DETECTED Final   Candida parapsilosis NOT DETECTED NOT DETECTED Final   Candida tropicalis NOT DETECTED NOT DETECTED Final   Cryptococcus neoformans/gattii NOT DETECTED NOT DETECTED Final    Comment: Performed at [facility], [address] [place], [place] [address]  Resp Panel by RT-PCR (Flu A&B, Covid) Nasopharyngeal Swab     Status: None   Collection Time: 10/18/21  8:16 PM   Specimen: Nasopharyngeal  Swab; Nasopharyngeal(NP) swabs in vial transport medium  Result Value Ref Range Status   SARS Coronavirus 2 by RT PCR NEGATIVE NEGATIVE Final    Comment: (NOTE) SARS-CoV-2 target nucleic acids are NOT DETECTED.  The SARS-CoV-2 RNA is generally detectable in upper respiratory specimens during the acute phase of infection. The lowest concentration of SARS-CoV-2 viral copies this assay can detect is 138 copies/mL. A negative result does not preclude SARS-Cov-2 infection and should not be used as the sole basis for treatment or other patient management decisions. A negative result may occur with  improper specimen collection/handling, submission of specimen other than nasopharyngeal swab, presence of viral mutation(s) within the areas targeted by this assay, and inadequate number of viral copies(<138 copies/mL). A negative result must be combined with clinical observations, patient history, and epidemiological information. The expected result is Negative.  Fact Sheet for Patients:   EntrepreneurPulse.com.au  Fact Sheet for Healthcare Providers:  IncredibleEmployment.be  This test is no t yet approved or cleared by the [place] FDA and  has been authorized for detection and/or diagnosis of SARS-CoV-2 by FDA under an Emergency Use Authorization (EUA). This EUA will remain  in effect (meaning this test can be used) for the duration of the COVID-19 declaration under Section 564(b)(1) of the Act, 21 U.S.C.section 360bbb-3(b)(1), unless the authorization is terminated  or revoked sooner.       Influenza A by PCR NEGATIVE NEGATIVE Final   Influenza B by PCR NEGATIVE NEGATIVE Final    Comment: (NOTE) The Xpert Xpress SARS-CoV-2/FLU/RSV plus assay is intended as an aid in the diagnosis of influenza from Nasopharyngeal swab specimens and should not be used as a sole basis for treatment. Nasal washings and aspirates are unacceptable for Xpert Xpress SARS-CoV-2/FLU/RSV testing.  Fact Sheet for Patients: EntrepreneurPulse.com.au  Fact Sheet for Healthcare Providers: IncredibleEmployment.be  This test is not yet approved or cleared by the [place] FDA and has been authorized for detection and/or diagnosis of SARS-CoV-2 by FDA under an Emergency Use Authorization (EUA). This EUA will remain in effect (meaning this test can be used) for the duration of the COVID-19 declaration under Section 564(b)(1) of the Act, 21 U.S.C. section 360bbb-3(b)(1), unless the authorization is terminated or revoked.  Performed at [facility], [address] [place], [place] [address]   Culture, blood (Routine X 2) w Reflex to ID Panel     Status: None (Preliminary result)   Collection Time: 10/18/21 11:28 PM   Specimen: BLOOD  Result Value Ref Range Status   Specimen Description BLOOD LEFT HAND  Final   Special Requests   Final    BOTTLES DRAWN AEROBIC ONLY Blood Culture adequate volume    Culture   Final    NO GROWTH 3 DAYS Performed at [facility], [address] [place], [place] [address]    Report Status PENDING  Incomplete  MRSA Next Gen by PCR, Nasal     Status: None   Collection Time: 10/19/21  5:04 AM   Specimen: Nasal Mucosa; Nasal Swab  Result Value Ref Range Status   MRSA by PCR Next Gen NOT DETECTED NOT DETECTED Final    Comment: (NOTE) The GeneXpert MRSA Assay (FDA approved for NASAL specimens only), is one component of a comprehensive MRSA colonization surveillance program. It is not intended to diagnose MRSA infection nor to guide or monitor treatment for MRSA infections. Test performance is not FDA approved in patients less than 32 years old. Performed at [facility],  [place], [place] [address]   CULTURE, BLOOD (ROUTINE X 2) w Reflex to ID Panel     Status: None (Preliminary result)   Collection Time: 10/20/21  6:08 PM   Specimen: BLOOD  Result Value Ref Range Status   Specimen Description BLOOD LFOA  Final   Special Requests BOTTLES DRAWN AEROBIC AND ANAEROBIC BCAV  Final   Culture   Final    NO GROWTH < 12 HOURS Performed at [facility], [address] [place], [place] [address]    Report Status PENDING  Incomplete  CULTURE, BLOOD (ROUTINE X 2) w Reflex to ID Panel     Status: None (Preliminary result)   Collection Time: 10/20/21  6:23 PM   Specimen: BLOOD  Result Value Ref Range Status   Specimen Description BLOOD [facility]  Final   Special Requests BOTTLES DRAWN AEROBIC ONLY BCLV  Final   Culture   Final    NO GROWTH < 12 HOURS Performed at [facility], [address] [place], [place] [address]    Report Status PENDING  Incomplete    Coagulation Studies: No results for input(s): LABPROT, INR in the last 72 hours.  Urinalysis: Recent Labs    10/18/21 1755  COLORURINE YELLOW*  LABSPEC 1.012  PHURINE 5.0  GLUCOSEU NEGATIVE  HGBUR MODERATE*  BILIRUBINUR NEGATIVE  KETONESUR  NEGATIVE  PROTEINUR 100*  NITRITE NEGATIVE  LEUKOCYTESUR NEGATIVE       Imaging: DG Chest 1 View  Result Date: 10/20/2021 CLINICAL DATA:  Shortness of breath EXAM: CHEST  1 VIEW COMPARISON:  10/19/2021 FINDINGS: Right IJ line with tip at the SVC. Generous lung volumes with mild interstitial coarsening attributed to COPD. Normal heart size and mediastinal contours. There is no edema, consolidation, effusion, or pneumothorax. IMPRESSION: No active disease. Electronically Signed   By: Jorje Guild M.D.   On: 10/20/2021 04:02   ECHOCARDIOGRAM COMPLETE  Result Date: 10/19/2021    ECHOCARDIOGRAM REPORT   Patient Name:   Edward Singleton Date of Exam: 10/19/2021 Medical Rec #:  [id number]       Height:       68.0 in Accession #:    [id number]      Weight:       160.0 lb Date of Birth:  11/20/1950        BSA:          1.859 m Patient Age:    63 years        BP:           121/62 mmHg Patient Gender: M               HR:           80 bpm. Exam Location:  [facility] Procedure: 2D Echo, Color Doppler and Cardiac Doppler Indications:     I42.9 Cardiomyopathy-unspecified  History:         Patient has no prior history of Echocardiogram examinations.                  Risk Factors:Hypertension, Diabetes, HCL and Current Smoker. Hx                  of alcohol abuse.  Sonographer:     Charmayne Sheer Referring Phys:  [id number] BRITTON L RUST-CHESTER Diagnosing Phys: Harrell Gave End MD  Sonographer Comments: Technically difficult study due to poor echo windows and no apical window. IMPRESSIONS  1. Left ventricular ejection fraction, by estimation, is >55%.  The left ventricle has normal function. Left ventricular endocardial border not optimally defined to evaluate regional wall motion. Left ventricular diastolic parameters are indeterminate.  2. Right ventricular systolic function is normal. The right ventricular size is normal. Tricuspid regurgitation signal is inadequate for assessing PA pressure.  3. The mitral valve was not well  visualized. Unable to accurately assess mitral valve regurgitation.  4. The aortic valve is tricuspid. There is mild calcification of the aortic valve. There is mild thickening of the aortic valve. Aortic valve regurgitation not well assessed. Aortic valve gradient could not be assessed.  5. The inferior vena cava is normal in size with <50% respiratory variability, suggesting right atrial pressure of 8 mmHg. FINDINGS  Left Ventricle: Left ventricular ejection fraction, by estimation, is >55%. The left ventricle has normal function. Left ventricular endocardial border not optimally defined to evaluate regional wall motion. The left ventricular internal cavity size was  normal in size. There is no left ventricular hypertrophy. Left ventricular diastolic parameters are indeterminate. Right Ventricle: The right ventricular size is normal. No increase in right ventricular wall thickness. Right ventricular systolic function is normal. Tricuspid regurgitation signal is inadequate for assessing PA pressure. Left Atrium: Left atrial size was not well visualized. Right Atrium: Right atrial size was not well visualized. Pericardium: There is no evidence of pericardial effusion. Mitral Valve: The mitral valve was not well visualized. Mild mitral annular calcification. Unable to accurately assess mitral valve regurgitation. Tricuspid Valve: The tricuspid valve is not well visualized. Tricuspid valve regurgitation is trivial. Aortic Valve: The aortic valve is tricuspid. There is mild calcification of the aortic valve. There is mild thickening of the aortic valve. Aortic valve regurgitation not well assessed. Aortic valve gradient could not be assessed. Pulmonic Valve: The pulmonic valve was not well visualized. Pulmonic valve regurgitation is not visualized. No evidence of pulmonic stenosis. Aorta: The aortic root is normal in size and structure. Pulmonary Artery: The pulmonary artery is not well seen. Venous: The inferior vena  cava is normal in size with less than 50% respiratory variability, suggesting right atrial pressure of 8 mmHg. IAS/Shunts: No atrial level shunt detected by color flow Doppler.  LEFT VENTRICLE PLAX 2D LVIDd:         3.83 cm LVIDs:         2.75 cm LV PW:         0.88 cm LV IVS:        0.73 cm LVOT diam:     2.40 cm LVOT Area:     4.52 cm  LEFT ATRIUM         Index LA diam:    3.80 cm 2.04 cm/m                        PULMONIC VALVE AORTA                 PV Vmax:       1.09 m/s Ao Root diam: 3.70 cm PV Vmean:      72.300 cm/s                       PV VTI:        0.185 m                       PV Peak grad:  4.8 mmHg  PV Mean grad:  2.0 mmHg   SHUNTS Systemic Diam: 2.40 cm Nelva Bush MD Electronically signed by Nelva Bush MD Signature Date/Time: 10/19/2021/3:40:12 PM    Final      Medications:    sodium chloride Stopped (10/19/21 0149)   sodium chloride     cefTRIAXone (ROCEPHIN)  IV 2 g (10/20/21 2056)   sodium bicarbonate 150 mEq in D5W infusion Stopped (10/20/21 0300)    Chlorhexidine Gluconate Cloth  6 each Topical D3220   folic acid  1 mg Oral Daily   heparin  5,000 Units Subcutaneous Q8H   insulin aspart  0-15 Units Subcutaneous TID WC   LORazepam  0-4 mg Intravenous Q8H   multivitamin with minerals  1 tablet Oral Daily   pantoprazole  40 mg Oral QHS   potassium chloride  40 mEq Oral BID   spiritus frumenti  1 each Oral TID AC   thiamine  100 mg Oral Daily   Or   thiamine  100 mg Intravenous Daily   sodium chloride, albuterol, docusate sodium, fentaNYL (SUBLIMAZE) injection, HYDROmorphone, LORazepam **OR** LORazepam, polyethylene glycol  Assessment/ Plan:  Mr. BENJAMINE Singleton is a 71 y.o.  male with past medical history of alcohol abuse, diabetes, hypertension, gout, and chronic kidney disease stage IV.  Patient presents to the emergency department after being found down at his home, suspected for several days.  Patient is admitted for Rhabdomyolysis  [M62.82]   Acute Kidney Injury on chronic kidney disease stage IV with baseline creatinine 2.58 and GFR of 26 on 05/13/21.  Patient currently followed by nephrology at [facility].   Admission renal function 14%. Acute kidney injury secondary to rhabdomyolysis Renal ultrasound ordered to evaluate obstruction No IV contrast exposure.  No acute indication for dialysis at this time.  Creatinine improving. Urine output adequate. IVF stopped due to increased SOB overnight. Will continue to monitor   Lab Results  Component Value Date   CREATININE 1.93 (H) 10/21/2021   CREATININE 2.18 (H) 10/20/2021   CREATININE 2.56 (H) 10/19/2021    Intake/Output Summary (Last 24 hours) at 10/21/2021 0840 Last data filed at 10/21/2021 0820 Gross per 24 hour  Intake 679.96 ml  Output 2130 ml  Net -1450.04 ml    2. Anemia of chronic kidney disease Lab Results  Component Value Date   HGB 7.2 (L) 10/21/2021    Hgb remains low. Will monitor  3.  Hypertension with chronic kidney disease.  Home regimen includes amlodipine and metoprolol.  Both currently held at this time. BP stable for this patient  4.  Diabetes mellitus type 2 with chronic kidney disease.Noninsulin-dependent diabetic.  Home regimen includes metformin.  Hemoglobin A1c 6.2 on 10/19/2021.  Primary team to manage SSI    LOS: 3 [place] 1/12/20238:40 AM

## 2021-10-21 NOTE — Progress Notes (Addendum)
PROGRESS NOTE    Edward Singleton  WSB:[id number] DOB: 11-14-1950 DOA: 10/18/2021 PCP: Windy Fast, MD     Brief Narrative:  Edward Singleton is a 71 year old male with long history of chronic alcohol use was reportedly found down in his house.  He was last seen well several days ago.  The patient bedside is alert and responsive but confused as to the timetable of the last few days.  He reports that he had been drinking too much alcohol and fell.  He believes he fell on Sunday but is unclear as to what happened or the timing of the event.  Police were called to the home and had to break down the door finding him on the floor with significant bruising lying on his right arm.  Per ED/EMS report the patient was confused, hypothermic and hypotensive receiving IV fluids.  Per Sister Jill, who is healthcare power of attorney, the patient has been binge drinking since the summer.  Upon arrival in the ER patient complaining of cold and thirst, became nauseous with some vomiting episodes.  He received warmed IV fluid, empiric antibiotics.  On-call orthopedist was consulted with additional imaging ordered and instructions to place right arm in a sling.  After aggressive IV fluid resuscitation patient requiring vasopressor support.  He was weaned off levophed on 1/10 and transferred to [facility] service 1/11.   New events last 24 hours / Subjective: Continues to complain of right shoulder pain.  Denies any shortness of breath, chest pain, nausea or vomiting.  Remained stable on room air.  Fever up to 100.6 overnight.  Assessment & Plan:   Principal Problem:   Rhabdomyolysis Active Problems:   AKI (acute kidney injury) (HCC)   Metabolic acidosis   Shock (HCC)   Septic and hypovolemic shock Sepsis secondary to strep bacteremia  -Now off levophed  -Blood culture showing strep gallolyticus, starph epidermidis 1/9 -Repeat blood culture pending 1/11 -Infectious disease following -Remains on  ceftriaxone  Rhabdomyolysis -CK improving  Closed comminuted right proximal humerus fracture -Orthopedic surgery consulted, recommended to transfer to [facility] once medically stable for surgical evaluation by Dr. Haddix and Dr. Handy  -Remains in right shoulder sling for now  Alcohol abuse with risk for DTs -Continue to monitor withdrawal on CIWA protocol  AKI on CKD stage IV -Baseline creatinine 2.6 -Nephrology following -Improving   Hypokalemia -Replace, trend   Symptomatic anemia, acute blood loss anemia  -Likely some blood loss component into hematoma of the right upper extremity -Hemoglobin 11.3 >> 8.6 >> 7.6 >> 7.2  -Transfuse 1 unit packed red blood cell in setting of hypoxemia and anemia -Chest x-ray did not reveal pulmonary edema -Change subq hep to SCDs  Acute hypoxic respiratory failure -Remains on O2   Tobacco abuse -Nicotine patch   DVT prophylaxis: SCD  Code Status: Full Family Communication: None at bedside  Disposition Plan:  Status is: Inpatient  Remains inpatient appropriate because: Remains on IV antibiotics, blood transfusion.  Hopeful transfer to  Hospital in a couple of days for surgical fixation of his right humerus fracture   Antimicrobials:  Anti-infectives (From admission, onward)    Start     Dose/Rate Route Frequency Ordered Stop   10/20/21 2000  cefTRIAXone (ROCEPHIN) 2 g in sodium chloride 0.9 % 100 mL IVPB        2 g 200 mL/hr over 30 Minutes Intravenous Every 24 hours 10/20/21 0824     01 /10/23 2000  ceFEPIme (MAXIPIME) 2 g  in sodium chloride 0.9 % 100 mL IVPB  Status:  Discontinued        2 g 200 mL/hr over 30 Minutes Intravenous Every 24 hours 10/19/21 1407 10/20/21 0824   10/19/21 0700  vancomycin (VANCOCIN) IVPB 1000 mg/200 mL premix  Status:  Discontinued        1,000 mg 200 mL/hr over 60 Minutes Intravenous  Once 10/19/21 0606 10/19/21 0609   10/19/21 0700  vancomycin (VANCOREADY) IVPB 750 mg/150 mL         750 mg 150 mL/hr over 60 Minutes Intravenous  Once 10/19/21 0609 10/19/21 0729   10/18/21 1815  ceFEPIme (MAXIPIME) 2 g in sodium chloride 0.9 % 100 mL IVPB        2 g 200 mL/hr over 30 Minutes Intravenous  Once 10/18/21 1812 10/18/21 2023   10/18/21 1815  metroNIDAZOLE (FLAGYL) IVPB 500 mg        500 mg 100 mL/hr over 60 Minutes Intravenous  Once 10/18/21 1812 10/18/21 2225   10/18/21 1815  vancomycin (VANCOCIN) IVPB 1000 mg/200 mL premix        1,000 mg 200 mL/hr over 60 Minutes Intravenous  Once 10/18/21 1812 10/18/21 2326        Objective: Vitals:   10/21/21 0740 10/21/21 0800 10/21/21 1000 10/21/21 1200  BP: 122/67 (!) 141/81 (!) 132/118 (!) 110/55  Pulse: 97 (!) 106 (!) 105 97  Resp: (!) 22 (!) 23 (!) 23 19  Temp:    99.1 F (37.3 C)  TempSrc:    Oral  SpO2: 93% 95% 94% 90%  Weight:      Height:        Intake/Output Summary (Last 24 hours) at 10/21/2021 1423 Last data filed at 10/21/2021 1400 Gross per 24 hour  Intake 700 ml  Output 2005 ml  Net -1305 ml    Filed Weights   10/18/21 1839 10/20/21 0500 10/21/21 0500  Weight: 72.6 kg 72.5 kg 66.3 kg    Examination: General exam: Appears calm and comfortable  Respiratory system: Clear to auscultation. Respiratory effort normal. On room air  Cardiovascular system: S1 & S2 heard, tachycardic, rate 110s. No pedal edema. Gastrointestinal system: Abdomen is nondistended, soft and nontender. Normal bowel sounds heard. Central nervous system: Alert and oriented. Non focal exam. Speech clear  Extremities: RUE in sling, diffuse ecchymosis  Psychiatry: Judgement and insight appear stable. Mood & affect appropriate.    Data Reviewed: I have personally reviewed following labs and imaging studies  CBC: Recent Labs  Lab 10/18/21 1800 10/19/21 0504 10/20/21 0650 10/21/21 0534  WBC 24.3* 18.4* 16.6* 18.6*  NEUTROABS 20.4*  --   --   --   HGB 11.3* 8.6* 7.6* 7.2*  HCT 34.4* 24.0* 22.0* 21.6*  MCV 104.9* 94.5 97.3  100.0  PLT 275 240 180 732    Basic Metabolic Panel: Recent Labs  Lab 10/18/21 1800 10/18/21 2016 10/19/21 0504 10/19/21 1402 10/20/21 0650 10/21/21 0534  NA 134* 134* 130* 133* 138 132*  K 3.5 3.5 2.8* 3.1*   3.1* 3.0* 3.2*  CL 91* 100 92* 94* 96* 94*  CO2 9* 10* 23 31 32 29  GLUCOSE 170* 192* 214* 122* 142* 161*  BUN 64* 59* 59* 48* 35* 27*  CREATININE 4.30* 3.68* 3.32* 2.56* 2.18* 1.93*  CALCIUM 8.1* 6.7* 7.5* 7.5* 8.0* 7.9*  MG 2.6*  --  1.7  --   --   --   PHOS  --  7.6* 4.9*  --   --   --  GFR: Estimated Creatinine Clearance: 33.9 mL/min (A) (by C-G formula based on SCr of 1.93 mg/dL (H)). Liver Function Tests: Recent Labs  Lab 10/18/21 1800 10/19/21 0504 10/20/21 0650  AST 37 46* 42*  ALT 19 21 23   ALKPHOS 100 83 73  BILITOT 1.0 1.0 1.1  PROT 6.2* 5.3* 5.1*  ALBUMIN 3.4* 3.0* 2.7*    Recent Labs  Lab 10/18/21 1800 10/19/21 0504  LIPASE 57* 112*    No results for input(s): AMMONIA in the last 168 hours. Coagulation Profile: No results for input(s): INR, PROTIME in the last 168 hours. Cardiac Enzymes: Recent Labs  Lab 10/18/21 1800 10/19/21 0504 10/20/21 0650 10/21/21 0534  CKTOTAL 990* 2,341* 1,503* 861*    BNP (last 3 results) No results for input(s): PROBNP in the last 8760 hours. HbA1C: Recent Labs    10/19/21 0648  HGBA1C 6.2*    CBG: Recent Labs  Lab 10/20/21 1118 10/20/21 1530 10/20/21 2125 10/21/21 0732 10/21/21 1117  GLUCAP 152* 125* 105* 126* 146*    Lipid Profile: No results for input(s): CHOL, HDL, LDLCALC, TRIG, CHOLHDL, LDLDIRECT in the last 72 hours. Thyroid Function Tests: Recent Labs    10/18/21 1800  TSH 1.008    Anemia Panel: No results for input(s): VITAMINB12, FOLATE, FERRITIN, TIBC, IRON, RETICCTPCT in the last 72 hours. Sepsis Labs: Recent Labs  Lab 10/18/21 1800 10/18/21 1824 10/18/21 1950 10/19/21 0125 10/19/21 0504 10/20/21 0650  PROCALCITON 1.25  --   --   --  1.34 0.89   LATICACIDVEN  --  8.2* 5.3* 2.1*  --   --      Recent Results (from the past 240 hour(s))  Blood culture (routine x 2)     Status: Abnormal   Collection Time: 10/18/21  6:24 PM   Specimen: BLOOD  Result Value Ref Range Status   Specimen Description   Final    BLOOD FOOT Performed at [facility], [address], [place], [place] [address]    Special Requests   Final    BOTTLES DRAWN AEROBIC AND ANAEROBIC BCAV Performed at [facility], [address], [place], [place] [address]    Culture  Setup Time   Final    Organism ID to follow IN BOTH AEROBIC AND ANAEROBIC BOTTLES Edward POSITIVE COCCI CRITICAL RESULT CALLED TO, READ BACK BY AND VERIFIED WITH: NATHAN BEULE 10/19/21 0602 MW    Culture (A)  Final    STREPTOCOCCUS GALLOLYTICUS STAPHYLOCOCCUS EPIDERMIDIS THE SIGNIFICANCE OF ISOLATING THIS ORGANISM FROM A SINGLE SET OF BLOOD CULTURES WHEN MULTIPLE SETS ARE DRAWN IS UNCERTAIN. PLEASE NOTIFY THE MICROBIOLOGY DEPARTMENT WITHIN ONE WEEK IF SPECIATION AND SENSITIVITIES ARE REQUIRED. CRITICAL RESULT CALLED TO, READ BACK BY AND VERIFIED WITH: [place]. [id number] [id number] FCP Performed at [facility], [address], [place], [place] [address]    Report Status 10/21/2021 FINAL  Final  Blood Culture ID Panel (Reflexed)     Status: Abnormal   Collection Time: 10/18/21  6:24 PM  Result Value Ref Range Status   Enterococcus faecalis NOT DETECTED NOT DETECTED Final   Enterococcus Faecium NOT DETECTED NOT DETECTED Final   Listeria monocytogenes NOT DETECTED NOT DETECTED Final   Staphylococcus species NOT DETECTED NOT DETECTED Final   Staphylococcus aureus (BCID) NOT DETECTED NOT DETECTED Final   Staphylococcus epidermidis NOT DETECTED NOT DETECTED Final   Staphylococcus lugdunensis NOT DETECTED NOT DETECTED Final   Streptococcus species DETECTED (A) NOT DETECTED Final    Comment: Not Enterococcus  species, Streptococcus agalactiae, Streptococcus pyogenes, or  Streptococcus pneumoniae. CRITICAL RESULT CALLED TO, READ BACK BY AND VERIFIED WITH: NATHAN BEULE 10/19/21 0602 MW    Streptococcus agalactiae NOT DETECTED NOT DETECTED Final   Streptococcus pneumoniae NOT DETECTED NOT DETECTED Final   Streptococcus pyogenes NOT DETECTED NOT DETECTED Final   A.calcoaceticus-baumannii NOT DETECTED NOT DETECTED Final   Bacteroides fragilis NOT DETECTED NOT DETECTED Final   Enterobacterales NOT DETECTED NOT DETECTED Final   Enterobacter cloacae complex NOT DETECTED NOT DETECTED Final   Escherichia coli NOT DETECTED NOT DETECTED Final   Klebsiella aerogenes NOT DETECTED NOT DETECTED Final   Klebsiella oxytoca NOT DETECTED NOT DETECTED Final   Klebsiella pneumoniae NOT DETECTED NOT DETECTED Final   Proteus species NOT DETECTED NOT DETECTED Final   Salmonella species NOT DETECTED NOT DETECTED Final   Serratia marcescens NOT DETECTED NOT DETECTED Final   Haemophilus influenzae NOT DETECTED NOT DETECTED Final   Neisseria meningitidis NOT DETECTED NOT DETECTED Final   Pseudomonas aeruginosa NOT DETECTED NOT DETECTED Final   Stenotrophomonas maltophilia NOT DETECTED NOT DETECTED Final   Candida albicans NOT DETECTED NOT DETECTED Final   Candida auris NOT DETECTED NOT DETECTED Final   Candida glabrata NOT DETECTED NOT DETECTED Final   Candida krusei NOT DETECTED NOT DETECTED Final   Candida parapsilosis NOT DETECTED NOT DETECTED Final   Candida tropicalis NOT DETECTED NOT DETECTED Final   Cryptococcus neoformans/gattii NOT DETECTED NOT DETECTED Final    Comment: Performed at [facility], [address] [place], [place] [address]  Resp Panel by RT-PCR (Flu A&B, Covid) Nasopharyngeal Swab     Status: None   Collection Time: 10/18/21  8:16 PM   Specimen: Nasopharyngeal Swab; Nasopharyngeal(NP) swabs in vial transport medium  Result Value Ref Range Status   SARS Coronavirus 2 by RT PCR NEGATIVE NEGATIVE Final    Comment: (NOTE) SARS-CoV-2 target  nucleic acids are NOT DETECTED.  The SARS-CoV-2 RNA is generally detectable in upper respiratory specimens during the acute phase of infection. The lowest concentration of SARS-CoV-2 viral copies this assay can detect is 138 copies/mL. A negative result does not preclude SARS-Cov-2 infection and should not be used as the sole basis for treatment or other patient management decisions. A negative result may occur with  improper specimen collection/handling, submission of specimen other than nasopharyngeal swab, presence of viral mutation(s) within the areas targeted by this assay, and inadequate number of viral copies(<138 copies/mL). A negative result must be combined with clinical observations, patient history, and epidemiological information. The expected result is Negative.  Fact Sheet for Patients:  EntrepreneurPulse.com.au  Fact Sheet for Healthcare Providers:  IncredibleEmployment.be  This test is no t yet approved or cleared by the [place] FDA and  has been authorized for detection and/or diagnosis of SARS-CoV-2 by FDA under an Emergency Use Authorization (EUA). This EUA will remain  in effect (meaning this test can be used) for the duration of the COVID-19 declaration under Section 564(b)(1) of the Act, 21 U.S.C.section 360bbb-3(b)(1), unless the authorization is terminated  or revoked sooner.       Influenza A by PCR NEGATIVE NEGATIVE Final   Influenza B by PCR NEGATIVE NEGATIVE Final    Comment: (NOTE) The Xpert Xpress SARS-CoV-2/FLU/RSV plus assay is intended as an aid in the diagnosis of influenza from Nasopharyngeal swab specimens and should not be used as a sole basis for treatment. Nasal washings and aspirates are unacceptable for Xpert Xpress SARS-CoV-2/FLU/RSV testing.  Fact Sheet for Patients:  EntrepreneurPulse.com.au  Fact Sheet for Healthcare  Providers: IncredibleEmployment.be  This test is not yet approved or cleared by the [place] FDA and has been authorized for detection and/or diagnosis of SARS-CoV-2 by FDA under an Emergency Use Authorization (EUA). This EUA will remain in effect (meaning this test can be used) for the duration of the COVID-19 declaration under Section 564(b)(1) of the Act, 21 U.S.C. section 360bbb-3(b)(1), unless the authorization is terminated or revoked.  Performed at [facility], [address] [place], [place] [address]   Culture, blood (Routine X 2) w Reflex to ID Panel     Status: None (Preliminary result)   Collection Time: 10/18/21 11:28 PM   Specimen: BLOOD  Result Value Ref Range Status   Specimen Description BLOOD LEFT HAND  Final   Special Requests   Final    BOTTLES DRAWN AEROBIC ONLY Blood Culture adequate volume   Culture   Final    NO GROWTH 3 DAYS Performed at [facility], [address], [place], [place] [address]    Report Status PENDING  Incomplete  MRSA Next Gen by PCR, Nasal     Status: None   Collection Time: 10/19/21  5:04 AM   Specimen: Nasal Mucosa; Nasal Swab  Result Value Ref Range Status   MRSA by PCR Next Gen NOT DETECTED NOT DETECTED Final    Comment: (NOTE) The GeneXpert MRSA Assay (FDA approved for NASAL specimens only), is one component of a comprehensive MRSA colonization surveillance program. It is not intended to diagnose MRSA infection nor to guide or monitor treatment for MRSA infections. Test performance is not FDA approved in patients less than 64 years old. Performed at [facility], [address] [place], [place] [address]   CULTURE, BLOOD (ROUTINE X 2) w Reflex to ID Panel     Status: None (Preliminary result)   Collection Time: 10/20/21  6:08 PM   Specimen: BLOOD  Result Value Ref Range Status   Specimen Description BLOOD LFOA  Final   Special Requests BOTTLES DRAWN AEROBIC AND  ANAEROBIC BCAV  Final   Culture   Final    NO GROWTH < 12 HOURS Performed at [facility], [address] [place], [place] [address]    Report Status PENDING  Incomplete  CULTURE, BLOOD (ROUTINE X 2) w Reflex to ID Panel     Status: None (Preliminary result)   Collection Time: 10/20/21  6:23 PM   Specimen: BLOOD  Result Value Ref Range Status   Specimen Description BLOOD [facility]  Final   Special Requests BOTTLES DRAWN AEROBIC ONLY BCLV  Final   Culture   Final    NO GROWTH < 12 HOURS Performed at [facility], [address] [place], [place] [address]    Report Status PENDING  Incomplete       Radiology Studies: DG Chest 1 View  Result Date: 10/20/2021 CLINICAL DATA:  Shortness of breath EXAM: CHEST  1 VIEW COMPARISON:  10/19/2021 FINDINGS: Right IJ line with tip at the SVC. Generous lung volumes with mild interstitial coarsening attributed to COPD. Normal heart size and mediastinal contours. There is no edema, consolidation, effusion, or pneumothorax. IMPRESSION: No active disease. Electronically Signed   By: Jorje Guild M.D.   On: 10/20/2021 04:02      Scheduled Meds:  sodium chloride   Intravenous Once   Chlorhexidine Gluconate Cloth  6 each Topical [id number]   folic acid  1 mg Oral Daily   heparin  5,000 Units Subcutaneous Q8H   insulin aspart  0-15 Units Subcutaneous TID WC   LORazepam  0-4 mg Intravenous Q8H   multivitamin with minerals  1 tablet Oral Daily   nicotine  14 mg Transdermal Daily   pantoprazole  40 mg Oral QHS   potassium chloride  40 mEq Oral BID   spiritus frumenti  1 each Oral TID AC   thiamine  100 mg Oral Daily   Or   thiamine  100 mg Intravenous Daily   Continuous Infusions:  sodium chloride Stopped (10/19/21 0149)   sodium chloride     cefTRIAXone (ROCEPHIN)  IV 2 g (10/20/21 2056)     LOS: 3 days      Time spent: 25 minutes   Dessa Phi, DO [facility] 10/21/2021, 2:23 PM   Available via Epic secure  chat 7am-7pm After these hours, please refer to coverage provider listed on [email or web address]

## 2021-10-21 NOTE — Progress Notes (Signed)
ID Pt says rt shoulder hurts Coughing No fever  O/e awake and alert Patient Vitals for the past 24 hrs:  BP Temp Temp src Pulse Resp SpO2 Weight  10/21/21 2028 127/74 98.4 F (36.9 C) Oral (!) 102 20 100 % --  10/21/21 2000 -- 98.4 F (36.9 C) Oral -- -- -- --  10/21/21 1836 103/78 98.4 F (36.9 C) Oral 96 (!) 24 97 % --  10/21/21 1815 102/72 98.4 F (36.9 C) Oral 98 (!) 24 94 % --  10/21/21 1800 103/72 -- -- 94 (!) 33 95 % --  10/21/21 1600 130/70 98.8 F (37.1 C) Oral (!) 103 (!) 23 92 % --  10/21/21 1400 115/73 -- -- (!) 104 16 96 % --  10/21/21 1200 (!) 110/55 99.1 F (37.3 C) Oral 97 19 90 % --  10/21/21 1000 (!) 132/118 -- -- (!) 105 (!) 23 94 % --  10/21/21 0800 (!) 141/81 -- -- (!) 106 (!) 23 95 % --  10/21/21 0740 122/67 -- -- 97 (!) 22 93 % --  10/21/21 0500 -- -- -- -- -- -- 66.3 kg  10/21/21 0400 118/66 99.9 F (37.7 C) -- 97 16 94 % --  10/21/21 0300 106/66 99.9 F (37.7 C) -- 97 (!) 24 94 % --  10/21/21 0228 -- -- -- -- -- 94 % --  10/21/21 0200 110/64 -- -- 91 -- -- --  10/21/21 0100 113/69 100.2 F (37.9 C) -- 94 (!) 24 (!) 87 % --  10/20/21 2300 (!) 100/58 (!) 100.4 F (38 C) -- 100 17 (!) 89 % --  10/20/21 2200 124/62 (!) 100.6 F (38.1 C) -- 93 (!) 24 93 % --    Rt arm ecchymosis Tender Chest b/l air entry Crepts bases HS- tachycardia Abd soft Rt IJ  Labs CBC Latest Ref Rng & Units 10/21/2021 10/20/2021 10/19/2021  WBC 4.0 - 10.5 K/uL 18.6(H) 16.6(H) 18.4(H)  Hemoglobin 13.0 - 17.0 g/dL 7.2(L) 7.6(L) 8.6(L)  Hematocrit 39.0 - 52.0 % 21.6(L) 22.0(L) 24.0(L)  Platelets 150 - 400 K/uL 200 180 240    CMP Latest Ref Rng & Units 10/21/2021 10/20/2021 10/19/2021  Glucose 70 - 99 mg/dL 161(H) 142(H) 122(H)  BUN 8 - 23 mg/dL 27(H) 35(H) 48(H)  Creatinine 0.61 - 1.24 mg/dL 1.93(H) 2.18(H) 2.56(H)  Sodium 135 - 145 mmol/L 132(L) 138 133(L)  Potassium 3.5 - 5.1 mmol/L 3.2(L) 3.0(L) 3.1(L)  Chloride 98 - 111 mmol/L 94(L) 96(L) 94(L)  CO2 22 - 32 mmol/L 29  32 31  Calcium 8.9 - 10.3 mg/dL 7.9(L) 8.0(L) 7.5(L)  Total Protein 6.5 - 8.1 g/dL - 5.1(L) -  Total Bilirubin 0.3 - 1.2 mg/dL - 1.1 -  Alkaline Phos 38 - 126 U/L - 73 -  AST 15 - 41 U/L - 42(H) -  ALT 0 - 44 U/L - 23 -    Micro 10/18/21  BC strep gallolyticus 10/20/21 BC - sent   Impression/recommendation   Fall following alcohol consumption. Complicated fracture of the right humerus Severe bruising/ecchymosis of the right arm and shoulder with severe pain.  Watch for secondary infection.  Hypoxia- watch for fat emboli Circulatory shock secondary to alcoholic ketosis, hypothermia and infection Received pressure support but now stable   Streptococcus Galloyticus bacteremia CT scan done on 10/18/2021 showed displaced and comminuted fracture of the proximal right humerus and comminuted fracture of the humeral head.  Diffuse subcutaneous edema of the right upper extremity and right axilla.  No fluid collection  or large hematoma. Pt on ceftriaxone Repeat blood culture sent 2 d echo is not a good study This bacteria can be associated with Colon malignancy and has the potential to cause endocarditis Because of worsening leucocytosis recommend repeat imaging of rt shoulder and arm to look for abscess/infected hematoma  Risk for pneumonia because of being bed bound and risk for aspiration   History of splenectomy.  Rhabdomyolysis improving.   AKI on CKD.  Secondary to rhabdo, prerenal and infection..Improving  Nephrology on board     Diabetes mellitus on sliding scale   Discussed the management with patient and his nurse

## 2021-10-21 NOTE — Progress Notes (Signed)
Subjective:  Patient seen in his ICU room this afternoon.  RN is at the bedside.  Patient just had a central line removed.  The patient is clinically improving.  Patient is currently off Levophed.  He has a long history of chronic alcohol abuse.  Patient has a comminuted fracture of the right proximal humerus.  I discussed this case with Dr. Lennette Bihari Haddix orthopedic specialist at [facility].  He recommends treatment with a total shoulder arthroplasty for his fracture, but this is not emergent.  Objective:   VITALS:   Vitals:   10/21/21 0740 10/21/21 0800 10/21/21 1000 10/21/21 1200  BP: 122/67 (!) 141/81 (!) 132/118 (!) 110/55  Pulse: 97 (!) 106 (!) 105 97  Resp: (!) 22 (!) 23 (!) 23 19  Temp:    99.1 F (37.3 C)  TempSrc:    Oral  SpO2: 93% 95% 94% 90%  Weight:      Height:        PHYSICAL EXAM: Right upper extremity: Skin is intact.  Patient has significant swelling but compartments are soft compressible.  He is resolving ecchymosis.  Distally the patient can flex and extend his fingers and has intact sensation light touch and a palpable radial pulse. Neurovascular intact Sensation intact distally Intact pulses distally No cellulitis present Compartment soft  LABS  Results for orders placed or performed during the hospital encounter of 10/18/21 (from the past 24 hour(s))  Glucose, capillary     Status: Abnormal   Collection Time: 10/20/21  3:30 PM  Result Value Ref Range   Glucose-Capillary 125 (H) 70 - 99 mg/dL  CULTURE, BLOOD (ROUTINE X 2) w Reflex to ID Panel     Status: None (Preliminary result)   Collection Time: 10/20/21  6:08 PM   Specimen: BLOOD  Result Value Ref Range   Specimen Description BLOOD LFOA    Special Requests BOTTLES DRAWN AEROBIC AND ANAEROBIC BCAV    Culture      NO GROWTH < 12 HOURS Performed at [facility], [address] [place], [place] [address]    Report Status PENDING   CULTURE, BLOOD (ROUTINE X 2) w Reflex to ID Panel      Status: None (Preliminary result)   Collection Time: 10/20/21  6:23 PM   Specimen: BLOOD  Result Value Ref Range   Specimen Description BLOOD BLH    Special Requests BOTTLES DRAWN AEROBIC ONLY BCLV    Culture      NO GROWTH < 12 HOURS Performed at [facility], [address] [place], [place] [address]    Report Status PENDING   Glucose, capillary     Status: Abnormal   Collection Time: 10/20/21  9:25 PM  Result Value Ref Range   Glucose-Capillary 105 (H) 70 - 99 mg/dL   Comment 1 Notify RN    Comment 2 Document in Chart   CBC     Status: Abnormal   Collection Time: 10/21/21  5:34 AM  Result Value Ref Range   WBC 18.6 (H) 4.0 - 10.5 K/uL   RBC 2.16 (L) 4.22 - 5.81 MIL/uL   Hemoglobin 7.2 (L) 13.0 - 17.0 g/dL   HCT 21.6 (L) 39.0 - 52.0 %   MCV 100.0 80.0 - 100.0 fL   MCH 33.3 26.0 - 34.0 pg   MCHC 33.3 30.0 - 36.0 g/dL   RDW 15.0 11.5 - 15.5 %   Platelets 200 150 - 400 K/uL   nRBC 0.2 0.0 - 0.2 %  Basic metabolic panel     Status: Abnormal   Collection Time: 10/21/21  5:34 AM  Result Value Ref Range   Sodium 132 (L) 135 - 145 mmol/L   Potassium 3.2 (L) 3.5 - 5.1 mmol/L   Chloride 94 (L) 98 - 111 mmol/L   CO2 29 22 - 32 mmol/L   Glucose, Bld 161 (H) 70 - 99 mg/dL   BUN 27 (H) 8 - 23 mg/dL   Creatinine, Ser 1.93 (H) 0.61 - 1.24 mg/dL   Calcium 7.9 (L) 8.9 - 10.3 mg/dL   GFR, Estimated 37 (L) >60 mL/min   Anion gap 9 5 - 15  CK     Status: Abnormal   Collection Time: 10/21/21  5:34 AM  Result Value Ref Range   Total CK 861 (H) 49 - 397 U/L  Glucose, capillary     Status: Abnormal   Collection Time: 10/21/21  7:32 AM  Result Value Ref Range   Glucose-Capillary 126 (H) 70 - 99 mg/dL  Glucose, capillary     Status: Abnormal   Collection Time: 10/21/21 11:17 AM  Result Value Ref Range   Glucose-Capillary 146 (H) 70 - 99 mg/dL  Prepare RBC (crossmatch)     Status: None (Preliminary result)   Collection Time: 10/21/21  2:24 PM  Result Value Ref Range   Order  Confirmation PENDING     DG Chest 1 View  Result Date: 10/20/2021 CLINICAL DATA:  Shortness of breath EXAM: CHEST  1 VIEW COMPARISON:  10/19/2021 FINDINGS: Right IJ line with tip at the SVC. Generous lung volumes with mild interstitial coarsening attributed to COPD. Normal heart size and mediastinal contours. There is no edema, consolidation, effusion, or pneumothorax. IMPRESSION: No active disease. Electronically Signed   By: Jorje Guild M.D.   On: 10/20/2021 04:02    Assessment/Plan:     Principal Problem:   Rhabdomyolysis Active Problems:   AKI (acute kidney injury) ([place])   Metabolic acidosis   Shock (HCC)  Continue sling and swath on the right arm.  Patient should avoid any active forward elevation or abduction of the right shoulder.  Given the patient's comminution, Dr. Doreatha Martin has not recommended ORIF for the patient's fracture but instead recommended an arthroplasty.  This can be done as an outpatient.  Patient will be set up with an arthroplasty specialist after discharge and can set up surgery electively.    Thornton Park , MD 10/21/2021, 2:40 PM

## 2021-10-22 ENCOUNTER — Inpatient Hospital Stay: Payer: No Typology Code available for payment source

## 2021-10-22 DIAGNOSIS — D72829 Elevated white blood cell count, unspecified: Secondary | ICD-10-CM | POA: Diagnosis not present

## 2021-10-22 DIAGNOSIS — R7881 Bacteremia: Secondary | ICD-10-CM | POA: Diagnosis not present

## 2021-10-22 DIAGNOSIS — N179 Acute kidney failure, unspecified: Secondary | ICD-10-CM | POA: Diagnosis not present

## 2021-10-22 DIAGNOSIS — S4291XA Fracture of right shoulder girdle, part unspecified, initial encounter for closed fracture: Secondary | ICD-10-CM | POA: Diagnosis not present

## 2021-10-22 DIAGNOSIS — R339 Retention of urine, unspecified: Secondary | ICD-10-CM

## 2021-10-22 LAB — BASIC METABOLIC PANEL
Anion gap: 8 (ref 5–15)
BUN: 23 mg/dL (ref 8–23)
CO2: 26 mmol/L (ref 22–32)
Calcium: 8.4 mg/dL — ABNORMAL LOW (ref 8.9–10.3)
Chloride: 100 mmol/L (ref 98–111)
Creatinine, Ser: 1.91 mg/dL — ABNORMAL HIGH (ref 0.61–1.24)
GFR, Estimated: 37 mL/min — ABNORMAL LOW (ref >60)
Glucose, Bld: 129 mg/dL — ABNORMAL HIGH (ref 70–99)
Potassium: 4.1 mmol/L (ref 3.5–5.1)
Sodium: 134 mmol/L — ABNORMAL LOW (ref 135–145)

## 2021-10-22 LAB — CBC
HCT: 29.3 % — ABNORMAL LOW (ref 39.0–52.0)
Hemoglobin: 10 g/dL — ABNORMAL LOW (ref 13.0–17.0)
MCH: 33 pg (ref 26.0–34.0)
MCHC: 34.1 g/dL (ref 30.0–36.0)
MCV: 96.7 fL (ref 80.0–100.0)
Platelets: 266 10*3/uL (ref 150–400)
RBC: 3.03 MIL/uL — ABNORMAL LOW (ref 4.22–5.81)
RDW: 18.3 % — ABNORMAL HIGH (ref 11.5–15.5)
WBC: 22.8 10*3/uL — ABNORMAL HIGH (ref 4.0–10.5)
nRBC: 0.9 % — ABNORMAL HIGH (ref 0.0–0.2)

## 2021-10-22 LAB — BPAM RBC
Blood Product Expiration Date: 202301202359
ISSUE DATE / TIME: 202301121816
Unit Type and Rh: 600

## 2021-10-22 LAB — GLUCOSE, CAPILLARY
Glucose-Capillary: 121 mg/dL — ABNORMAL HIGH (ref 70–99)
Glucose-Capillary: 130 mg/dL — ABNORMAL HIGH (ref 70–99)
Glucose-Capillary: 136 mg/dL — ABNORMAL HIGH (ref 70–99)

## 2021-10-22 LAB — TYPE AND SCREEN
ABO/RH(D): A POS
Antibody Screen: NEGATIVE
Unit division: 0

## 2021-10-22 LAB — MAGNESIUM: Magnesium: 1.6 mg/dL — ABNORMAL LOW (ref 1.7–2.4)

## 2021-10-22 LAB — CK: Total CK: 581 U/L — ABNORMAL HIGH (ref 49–397)

## 2021-10-22 MED ORDER — MAGNESIUM SULFATE 2 GM/50ML IV SOLN
2.0000 g | Freq: Once | INTRAVENOUS | Status: AC
Start: 2021-10-22 — End: 2021-10-22
  Administered 2021-10-22: 2 g via INTRAVENOUS
  Filled 2021-10-22 (×2): qty 50

## 2021-10-22 MED ORDER — SPIRITUS FRUMENTI
1.0000 | Freq: Two times a day (BID) | ORAL | Status: DC
  Administered 2021-10-25 (×2): 1 via ORAL
  Filled 2021-10-22 (×7): qty 1

## 2021-10-22 NOTE — Evaluation (Signed)
Physical Therapy Evaluation Patient Details Name: Edward Singleton MRN: [id number] DOB: 30-Sep-1951 Today's Date: 10/22/2021  History of Present Illness  Pt is a 71 y/o M admitted on 10/18/21 after being found down in his house. Pt reports he had been drinking too much & fell. Pt was confused, hypothermic & hypotensive. Pt is being treated for septic 2/2 strep bacteremia & hypovolemic shock. Pt found to have close comminuted R proximal humerus fx with recommendations to use R shoulder sling now & planning for transfer to [facility] once medically stable for surgical evaluation by Dr. Marcelino Scot & Dr. Doreatha Martin. Per ortho conversation with Dr. Doreatha Martin but will require arthroplasty but will schedule this as an outpatient. PMH: chronic alcohol use  Clinical Impression  Pt seen for PT evaluation with pt agreeable; pt AxOx4. PT educates pt on NWB RUE & required extra time to assist pt with adjusting RUE sling at beginning of session. Pt is limited by RUE pain with all movement & requires max assist for bed mobility with HOB elevated & bed rails, min/mod assist for sit>stand & stand pivot to recliner. Further gait deferred due to low SpO2 readings - but question accuracy 2/2 significantly cold fingers - nurse made aware of O2 readings & in to assess pt.  Pt received on room air upon PT arrival with SpO2 80-81%, placed nasal cannula on at 2L/min with SpO2 83%, increased to 4L/min with average reading of 85%; pt denied feeling SOB during session.       Recommendations for follow up therapy are one component of a multi-disciplinary discharge planning process, led by the attending physician.  Recommendations may be updated based on patient status, additional functional criteria and insurance authorization.  Follow Up Recommendations Skilled nursing-short term rehab (<3 hours/day)    Assistance Recommended at Discharge Intermittent Supervision/Assistance  Patient can return home with the following  A lot of help  with walking and/or transfers;A lot of help with bathing/dressing/bathroom;Assist for transportation;Assistance with cooking/housework;Direct supervision/assist for financial management;Help with stairs or ramp for entrance;Direct supervision/assist for medications management    Equipment Recommendations None recommended by PT (TBD in next venue)  Recommendations for Other Services       Functional Status Assessment Patient has had a recent decline in their functional status and demonstrates the ability to make significant improvements in function in a reasonable and predictable amount of time.     Precautions / Restrictions Precautions Precautions: Fall Restrictions Weight Bearing Restrictions: Yes RUE Weight Bearing: Non weight bearing (in sling)      Mobility  Bed Mobility Overal bed mobility: Needs Assistance Bed Mobility: Supine to Sit     Supine to sit: HOB elevated;Max assist     General bed mobility comments: cuing for technique & sequencing, max assist overall, limited by RUE pain    Transfers Overall transfer level: Needs assistance Equipment used: 1 person hand held assist Transfers: Sit to/from Stand;Bed to chair/wheelchair/BSC Sit to Stand: Min assist   Step pivot transfers: Min assist;Mod assist (LUE support, decreased speed of stepping)            Ambulation/Gait                  Stairs            Wheelchair Mobility    Modified Rankin (Stroke Patients Only)       Balance Overall balance assessment: Needs assistance Sitting-balance support: Feet supported;Single extremity supported Sitting balance-Leahy Scale: [place]     Standing  balance support: Single extremity supported;During functional activity Standing balance-Leahy Scale: Poor                               Pertinent Vitals/Pain Pain Assessment: Faces Faces Pain Scale: Hurts whole lot Pain Location: RUE Pain Descriptors / Indicators:  Discomfort;Grimacing Pain Intervention(s): Monitored during session;Repositioned;Limited activity within patient's tolerance    Home Living Family/patient expects to be discharged to:: Skilled nursing facility Living Arrangements: Alone Available Help at Discharge: Family;Available PRN/intermittently Type of Home: House Home Access: Stairs to enter Entrance Stairs-Rails: Left Entrance Stairs-Number of Steps: 4   Home Layout: One level Home Equipment: Cane - single point      Prior Function Prior Level of Function : Independent/Modified Independent             Mobility Comments: Pt reports independence without AD, lives alone. Sister reports she bring him groceries & manages his finance because he has a hx of alcohol/substance abuse.       Hand Dominance        Extremity/Trunk Assessment   Upper Extremity Assessment Upper Extremity Assessment: RUE deficits/detail (LUE WNL) RUE Deficits / Details: RUE with ecchymosis & pt c/o significant pain in RUE. Utilized sling with PT adjusting sling at beginning of session.    Lower Extremity Assessment Lower Extremity Assessment: Generalized weakness       Communication   Communication: No difficulties  Cognition Arousal/Alertness: Awake/alert Behavior During Therapy: WFL for tasks assessed/performed Overall Cognitive Status: Within Functional Limits for tasks assessed                                 General Comments: Pt AxOx4 but appears to have some decreased insight - will assess more in functional context. Does endorse alcohol abuse.        General Comments      Exercises     Assessment/Plan    PT Assessment Patient needs continued PT services  PT Problem List Decreased strength;Decreased mobility;Decreased safety awareness;Decreased activity tolerance;Decreased balance;Decreased knowledge of use of DME;Pain;Cardiopulmonary status limiting activity;Decreased skin integrity;Decreased knowledge of  precautions       PT Treatment Interventions DME instruction;Therapeutic exercise;Gait training;Balance training;Stair training;Neuromuscular re-education;Functional mobility training;Therapeutic activities;Patient/family education;Modalities    PT Goals (Current goals can be found in the Care Plan section)  Acute Rehab PT Goals Patient Stated Goal: get better PT Goal Formulation: With patient/family Time For Goal Achievement: 11/05/21 Potential to Achieve Goals: Good    Frequency Min 2X/week     Co-evaluation               AM-PAC PT "6 Clicks" Mobility  Outcome Measure Help needed turning from your back to your side while in a flat bed without using bedrails?: A Lot Help needed moving from lying on your back to sitting on the side of a flat bed without using bedrails?: Total Help needed moving to and from a bed to a chair (including a wheelchair)?: A Lot Help needed standing up from a chair using your arms (e.g., wheelchair or bedside chair)?: A Lot Help needed to walk in hospital room?: A Lot Help needed climbing 3-5 steps with a railing? : A Lot 6 Click Score: 11    End of Session Equipment Utilized During Treatment: Gait belt (RUE sling) Activity Tolerance: Patient tolerated treatment well (limited by O2 readings) Patient left: in chair;with chair  alarm set;with call bell/phone within reach;with nursing/sitter in room Nurse Communication: Mobility status (O2) PT Visit Diagnosis: Unsteadiness on feet (R26.81);Muscle weakness (generalized) (M62.81);Difficulty in walking, not elsewhere classified (R26.2);Pain Pain - Right/Left: Right Pain - part of body: Shoulder    Time: 2481-8590 PT Time Calculation (min) (ACUTE ONLY): 22 min   Charges:   PT Evaluation $PT Eval Low Complexity: 1 Low PT Treatments $Therapeutic Activity: 8-22 mins        Lavone Nian, PT, DPT 10/22/21, 4:26 PM   [name] 10/22/2021, 4:25 PM

## 2021-10-22 NOTE — Progress Notes (Signed)
Subjective: Patient transferred to the floor out of the ICU.  Patient being treated for rhabdo and sepsis.  Patient resting comfortably but easily arousable.  Patient complains of pain in the right shoulder.  Patient has a comminuted fracture of the right proximal humerus with significant swelling in the right arm.   Objective:   VITALS:   Vitals:   10/22/21 0500 10/22/21 0800 10/22/21 1000 10/22/21 1134  BP:  130/83 110/89 129/79  Pulse:  (!) 109 97 (!) 102  Resp:  (!) 33 (!) 28 18  Temp:  99.1 F (37.3 C)  98.7 F (37.1 C)  TempSrc:    Oral  SpO2:  91% 99% 93%  Weight: 66.2 kg     Height:        PHYSICAL EXAM: Right upper extremity: Change the patient's dressing over the lateral right arm today which was covering a small superficial abrasion versus skin tear.  There is no signs of infection.  Patient's ecchymosis and swelling are improving.  His compartments are soft and compressible in both the forearm and upper arm.  He has intact sensation light touch throughout the right upper extremity.  He can flex and extend all 5 fingers of the right hand his fingers well-perfused.  He is a palpable radial pulse.  Patient has a sling and swath on his right arm.   LABS  Results for orders placed or performed during the hospital encounter of 10/18/21 (from the past 24 hour(s))  Prepare RBC (crossmatch)     Status: None   Collection Time: 10/21/21  2:24 PM  Result Value Ref Range   Order Confirmation      ORDER PROCESSED BY BLOOD BANK Performed at [facility], [address] [place], [place] [address]   Type and screen [place]     Status: None   Collection Time: 10/21/21  3:44 PM  Result Value Ref Range   ABO/RH(D) A POS    Antibody Screen NEG    Sample Expiration 10/24/2021,2359    Unit Number [id number]    Blood Component Type RED CELLS,LR    Unit division 00    Status of Unit ISSUED,FINAL    Transfusion Status OK TO TRANSFUSE     Crossmatch Result      Compatible Performed at [facility], [address] [place], [place] [address]   Glucose, capillary     Status: Abnormal   Collection Time: 10/21/21  4:35 PM  Result Value Ref Range   Glucose-Capillary 102 (H) 70 - 99 mg/dL  CBC     Status: Abnormal   Collection Time: 10/22/21  4:44 AM  Result Value Ref Range   WBC 22.8 (H) 4.0 - 10.5 K/uL   RBC 3.03 (L) 4.22 - 5.81 MIL/uL   Hemoglobin 10.0 (L) 13.0 - 17.0 g/dL   HCT 29.3 (L) 39.0 - 52.0 %   MCV 96.7 80.0 - 100.0 fL   MCH 33.0 26.0 - 34.0 pg   MCHC 34.1 30.0 - 36.0 g/dL   RDW 18.3 (H) 11.5 - 15.5 %   Platelets 266 150 - 400 K/uL   nRBC 0.9 (H) 0.0 - 0.2 %  Basic metabolic panel     Status: Abnormal   Collection Time: 10/22/21  4:44 AM  Result Value Ref Range   Sodium 134 (L) 135 - 145 mmol/L   Potassium 4.1 3.5 - 5.1 mmol/L   Chloride 100 98 - 111 mmol/L   CO2 26 22 - 32  mmol/L   Glucose, Bld 129 (H) 70 - 99 mg/dL   BUN 23 8 - 23 mg/dL   Creatinine, Ser 1.91 (H) 0.61 - 1.24 mg/dL   Calcium 8.4 (L) 8.9 - 10.3 mg/dL   GFR, Estimated 37 (L) >60 mL/min   Anion gap 8 5 - 15  CK     Status: Abnormal   Collection Time: 10/22/21  4:44 AM  Result Value Ref Range   Total CK 581 (H) 49 - 397 U/L  Magnesium     Status: Abnormal   Collection Time: 10/22/21  4:44 AM  Result Value Ref Range   Magnesium 1.6 (L) 1.7 - 2.4 mg/dL  Glucose, capillary     Status: Abnormal   Collection Time: 10/22/21  7:27 AM  Result Value Ref Range   Glucose-Capillary 121 (H) 70 - 99 mg/dL  Glucose, capillary     Status: Abnormal   Collection Time: 10/22/21 12:11 PM  Result Value Ref Range   Glucose-Capillary 130 (H) 70 - 99 mg/dL    CT HUMERUS RIGHT WO CONTRAST  Result Date: 10/22/2021 CLINICAL DATA:  Chronic alcohol abuse.  Shoulder trauma. EXAM: CT OF THE RIGHT HUMERUS WITHOUT CONTRAST CT OF THE RIGHT FOREARM WITHOUT CONTRAST TECHNIQUE: Multidetector CT imaging was performed of the right humerus according to the  standard protocol. Multiplanar CT image reconstructions were also generated. Multidetector CT imaging was performed of the right forearm according to the standard protocol. Multiplanar CT image reconstructions were also generated. RADIATION DOSE REDUCTION: This exam was performed according to the departmental dose-optimization program which includes automated exposure control, adjustment of the mA and/or kV according to patient size and/or use of iterative reconstruction technique. COMPARISON:  None. FINDINGS: Bones/Joint/Cartilage Generalized osteopenia. Severely comminuted and displaced fracture of the surgical neck of right proximal humerus. 4 cm of lateral displacement, 2 cm of superior displacement. Numerous fracture fragments are located along the anterior humeral head. Severely comminuted fracture of the greater tuberosity and lesser tuberosity. Fracture cleft extends to the superior articular surface. No glenohumeral dislocation. Mild arthropathy of the acromioclavicular joint. No acute fracture or dislocation of the radius and ulna. No elbow fracture or dislocation. 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 Ligaments Ligaments are suboptimally evaluated by CT. Muscles and Tendons Focal area of atrophy in the biceps muscle likely reflecting prior injury. Soft tissue edema in the biceps muscle concerning for muscle strain. Soft tissue No fluid collection or hematoma. No soft tissue mass. Severe soft tissue edema throughout the upper arm and forearm with skin thickening which may reflect reactive edema versus cellulitis. No drainable fluid collection. IMPRESSION: 1. Severely comminuted and displaced fracture of the surgical neck of right proximal humerus. 4 cm of lateral displacement, 2 cm of superior displacement. Numerous fracture fragments are located along the anterior humeral head. Severely comminuted fracture of the greater tuberosity and lesser tuberosity. Fracture cleft  extends to the superior articular surface. 2.  No acute osseous injury of the right forearm. 3. 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 Electronically Signed   By: Kathreen Devoid M.D.   On: 10/22/2021 11:57   CT FOREARM RIGHT WO CONTRAST  Result Date: 10/22/2021 CLINICAL DATA:  Chronic alcohol abuse.  Shoulder trauma. EXAM: CT OF THE RIGHT HUMERUS WITHOUT CONTRAST CT OF THE RIGHT FOREARM WITHOUT CONTRAST TECHNIQUE: Multidetector CT imaging was performed of the right humerus according to the standard protocol. Multiplanar CT image reconstructions were also generated. Multidetector CT imaging was performed of  the right forearm according to the standard protocol. Multiplanar CT image reconstructions were also generated. RADIATION DOSE REDUCTION: This exam was performed according to the departmental dose-optimization program which includes automated exposure control, adjustment of the mA and/or kV according to patient size and/or use of iterative reconstruction technique. COMPARISON:  None. FINDINGS: Bones/Joint/Cartilage Generalized osteopenia. Severely comminuted and displaced fracture of the surgical neck of right proximal humerus. 4 cm of lateral displacement, 2 cm of superior displacement. Numerous fracture fragments are located along the anterior humeral head. Severely comminuted fracture of the greater tuberosity and lesser tuberosity. Fracture cleft extends to the superior articular surface. No glenohumeral dislocation. Mild arthropathy of the acromioclavicular joint. No acute fracture or dislocation of the radius and ulna. No elbow fracture or dislocation. 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 Ligaments Ligaments are suboptimally evaluated by CT. Muscles and Tendons Focal area of atrophy in the biceps muscle likely reflecting prior injury. Soft tissue edema in the biceps muscle concerning for muscle strain. Soft tissue No fluid collection  or hematoma. No soft tissue mass. Severe soft tissue edema throughout the upper arm and forearm with skin thickening which may reflect reactive edema versus cellulitis. No drainable fluid collection. IMPRESSION: 1. Severely comminuted and displaced fracture of the surgical neck of right proximal humerus. 4 cm of lateral displacement, 2 cm of superior displacement. Numerous fracture fragments are located along the anterior humeral head. Severely comminuted fracture of the greater tuberosity and lesser tuberosity. Fracture cleft extends to the superior articular surface. 2.  No acute osseous injury of the right forearm. 3. 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 Electronically Signed   By: Kathreen Devoid M.D.   On: 10/22/2021 11:57    Assessment/Plan:     Principal Problem:   Rhabdomyolysis Active Problems:   AKI (acute kidney injury) ([place])   Metabolic acidosis   Shock ([place])  CT scans of the right arm and forearm were ordered due to concern of superimposed infection.  Patient has severe edema ecchymosis from his fracture which is draining down his arm.  This is typical for these fractures especially due to the fact the patient was down for a significant amount of time after his injury.  There is no clinical signs of infection in the right arm at this time.  There is no need for surgical intervention at this time.  He will need to follow-up electively for arthroplasty treatment for his fracture as an outpatient after discharge.    Thornton Park , MD 10/22/2021, 1:40 PM

## 2021-10-22 NOTE — Progress Notes (Addendum)
PROGRESS NOTE    Edward Singleton  XMI:[id number] DOB: 04-Jul-1951 DOA: 10/18/2021 PCP: Windy Fast, MD     Brief Narrative:  Edward Singleton is a 71 year old male with long history of chronic alcohol use was reportedly found down in his house.  He was last seen well several days ago.  The patient bedside is alert and responsive but confused as to the timetable of the last few days.  He reports that he had been drinking too much alcohol and fell.  He believes he fell on Sunday but is unclear as to what happened or the timing of the event.  Police were called to the home and had to break down the door finding him on the floor with significant bruising lying on his right arm.  Per ED/EMS report the patient was confused, hypothermic and hypotensive receiving IV fluids.  Per Tamela Oddi, who is healthcare power of attorney, the patient has been binge drinking since the summer.  Upon arrival in the ER patient complaining of cold and thirst, became nauseous with some vomiting episodes.  He received warmed IV fluid, empiric antibiotics.  On-call orthopedist was consulted with additional imaging ordered and instructions to place right arm in a sling.  After aggressive IV fluid resuscitation patient requiring vasopressor support.  He was weaned off levophed on 1/10 and transferred to [facility] service 1/11.   New events last 24 hours / Subjective: Had episode of urinary retention this morning up to 1000 mL and Foley catheter was placed.  Continues to have right upper extremity pain.  Assessment & Plan:   Principal Problem:   Rhabdomyolysis Active Problems:   AKI (acute kidney injury) ([place])   Metabolic acidosis   Shock ([place])   Septic and hypovolemic shock Sepsis secondary to strep bacteremia  -Now off levophed  -Blood culture showing strep gallolyticus, starph epidermidis 1/9 -Repeat blood culture negative 1/11 -Infectious disease following -Remains on ceftriaxone -WBC continues to trend upward.  CT  right upper extremity completed which revealed reactive edema versus cellulitis, no drainable fluid collection. Discussed with Dr. Delaine Lame today  -Patient remains afebrile  Rhabdomyolysis -CK improving  Closed comminuted right proximal humerus fracture -Orthopedic surgery consulted, initially had planned on transferring to [facility] for surgical fixation, but now recommendation is for outpatient follow-up with elective repair, discussed with Dr. Mack Guise today  -Remains in right shoulder sling for now -PT  Alcohol abuse with risk for DTs -Continue to monitor withdrawal on CIWA protocol -Receiving alcohol TID --> deescalate to BID   AKI on CKD stage IV -Baseline creatinine 2.6 -Nephrology following -Improving   Acute urinary retention -Foley catheter placed 1/13  Symptomatic anemia, acute blood loss anemia  -Likely some blood loss component into hematoma of the right upper extremity -Hemoglobin 11.3 >> 8.6 >> 7.6 >> 7.2  -Transfuse 1 unit packed red blood cell in setting of hypoxemia and anemia -Chest x-ray did not reveal pulmonary edema -Change subq hep to SCDs  Acute hypoxic respiratory failure -Remains on O2   Tobacco abuse -Nicotine patch   Hypomagnesemia -Replace, trend  DVT prophylaxis: SCD  Code Status: Full Family Communication: Left voicemail for sister to call back  Disposition Plan:  Status is: Inpatient  Remains inpatient appropriate because: Remains on IV antibiotics, needs PT    Antimicrobials:  Anti-infectives (From admission, onward)    Start     Dose/Rate Route Frequency Ordered Stop   10/20/21 2000  cefTRIAXone (ROCEPHIN) 2 g in sodium chloride 0.9 % 100  mL IVPB        2 g 200 mL/hr over 30 Minutes Intravenous Every 24 hours 10/20/21 0824     10/19/21 2000  ceFEPIme (MAXIPIME) 2 g in sodium chloride 0.9 % 100 mL IVPB  Status:  Discontinued        2 g 200 mL/hr over 30 Minutes Intravenous Every 24 hours 10/19/21 1407 10/20/21  0824   10/19/21 0700  vancomycin (VANCOCIN) IVPB 1000 mg/200 mL premix  Status:  Discontinued        1,000 mg 200 mL/hr over 60 Minutes Intravenous  Once 10/19/21 0606 10/19/21 0609   10/19/21 0700  vancomycin (VANCOREADY) IVPB 750 mg/150 mL        750 mg 150 mL/hr over 60 Minutes Intravenous  Once 10/19/21 0609 10/19/21 0729   10/18/21 1815  ceFEPIme (MAXIPIME) 2 g in sodium chloride 0.9 % 100 mL IVPB        2 g 200 mL/hr over 30 Minutes Intravenous  Once 10/18/21 1812 10/18/21 2023   10/18/21 1815  metroNIDAZOLE (FLAGYL) IVPB 500 mg        500 mg 100 mL/hr over 60 Minutes Intravenous  Once 10/18/21 1812 10/18/21 2225   10/18/21 1815  vancomycin (VANCOCIN) IVPB 1000 mg/200 mL premix        1,000 mg 200 mL/hr over 60 Minutes Intravenous  Once 10/18/21 1812 10/18/21 2326        Objective: Vitals:   10/22/21 0500 10/22/21 0800 10/22/21 1000 10/22/21 1134  BP:  130/83 110/89 129/79  Pulse:  (!) 109 97 (!) 102  Resp:  (!) 33 (!) 28 18  Temp:  99.1 F (37.3 C)  98.7 F (37.1 C)  TempSrc:    Oral  SpO2:  91% 99% 93%  Weight: 66.2 kg     Height:        Intake/Output Summary (Last 24 hours) at 10/22/2021 1352 Last data filed at 10/22/2021 1000 Gross per 24 hour  Intake 706.49 ml  Output 1725 ml  Net -1018.51 ml    Filed Weights   10/20/21 0500 10/21/21 0500 10/22/21 0500  Weight: 72.5 kg 66.3 kg 66.2 kg    Examination: General exam: Appears calm and comfortable  Respiratory system: Clear to auscultation. Respiratory effort normal. On O2 Cardiovascular system: S1 & S2 heard, RR. No pedal edema. Gastrointestinal system: Abdomen is nondistended, soft and nontender. Normal bowel sounds heard. Central nervous system: Alert and oriented. Non focal exam. Speech clear  Extremities: RUE in sling, diffuse ecchymosis  Psychiatry: Judgement and insight appear stable. Mood & affect appropriate.    Data Reviewed: I have personally reviewed following labs and imaging  studies  CBC: Recent Labs  Lab 10/18/21 1800 10/19/21 0504 10/20/21 0650 10/21/21 0534 10/22/21 0444  WBC 24.3* 18.4* 16.6* 18.6* 22.8*  NEUTROABS 20.4*  --   --   --   --   HGB 11.3* 8.6* 7.6* 7.2* 10.0*  HCT 34.4* 24.0* 22.0* 21.6* 29.3*  MCV 104.9* 94.5 97.3 100.0 96.7  PLT 275 240 180 200 944    Basic Metabolic Panel: Recent Labs  Lab 10/18/21 1800 10/18/21 2016 10/19/21 0504 10/19/21 1402 10/20/21 0650 10/21/21 0534 10/22/21 0444  NA 134* 134* 130* 133* 138 132* 134*  K 3.5 3.5 2.8* 3.1*   3.1* 3.0* 3.2* 4.1  CL 91* 100 92* 94* 96* 94* 100  CO2 9* 10* 23 31 32 29 26  GLUCOSE 170* 192* 214* 122* 142* 161* 129*  BUN 64* 59*  59* 48* 35* 27* 23  CREATININE 4.30* 3.68* 3.32* 2.56* 2.18* 1.93* 1.91*  CALCIUM 8.1* 6.7* 7.5* 7.5* 8.0* 7.9* 8.4*  MG 2.6*  --  1.7  --   --   --  1.6*  PHOS  --  7.6* 4.9*  --   --   --   --     GFR: Estimated Creatinine Clearance: 34.2 mL/min (A) (by C-G formula based on SCr of 1.91 mg/dL (H)). Liver Function Tests: Recent Labs  Lab 10/18/21 1800 10/19/21 0504 10/20/21 0650  AST 37 46* 42*  ALT 19 21 23   ALKPHOS 100 83 73  BILITOT 1.0 1.0 1.1  PROT 6.2* 5.3* 5.1*  ALBUMIN 3.4* 3.0* 2.7*    Recent Labs  Lab 10/18/21 1800 10/19/21 0504  LIPASE 57* 112*    No results for input(s): AMMONIA in the last 168 hours. Coagulation Profile: No results for input(s): INR, PROTIME in the last 168 hours. Cardiac Enzymes: Recent Labs  Lab 10/18/21 1800 10/19/21 0504 10/20/21 0650 10/21/21 0534 10/22/21 0444  CKTOTAL 990* 2,341* 1,503* 861* 581*    BNP (last 3 results) No results for input(s): PROBNP in the last 8760 hours. HbA1C: No results for input(s): HGBA1C in the last 72 hours.  CBG: Recent Labs  Lab 10/21/21 0732 10/21/21 1117 10/21/21 1635 10/22/21 0727 10/22/21 1211  GLUCAP 126* 146* 102* 121* 130*    Lipid Profile: No results for input(s): CHOL, HDL, LDLCALC, TRIG, CHOLHDL, LDLDIRECT in the last 72  hours. Thyroid Function Tests: No results for input(s): TSH, T4TOTAL, FREET4, T3FREE, THYROIDAB in the last 72 hours.  Anemia Panel: No results for input(s): VITAMINB12, FOLATE, FERRITIN, TIBC, IRON, RETICCTPCT in the last 72 hours. Sepsis Labs: Recent Labs  Lab 10/18/21 1800 10/18/21 1824 10/18/21 1950 10/19/21 0125 10/19/21 0504 10/20/21 0650  PROCALCITON 1.25  --   --   --  1.34 0.89  LATICACIDVEN  --  8.2* 5.3* 2.1*  --   --      Recent Results (from the past 240 hour(s))  Blood culture (routine x 2)     Status: Abnormal (Preliminary result)   Collection Time: 10/18/21  6:24 PM   Specimen: BLOOD  Result Value Ref Range Status   Specimen Description   Final    BLOOD FOOT Performed at [facility], [address], [place], [place] [address]    Special Requests   Final    BOTTLES DRAWN AEROBIC AND ANAEROBIC BCAV Performed at [facility], [address], [place], [place] [address]    Culture  Setup Time   Final    Organism ID to follow IN BOTH AEROBIC AND ANAEROBIC BOTTLES GRAM POSITIVE COCCI CRITICAL RESULT CALLED TO, READ BACK BY AND VERIFIED WITH: NATHAN BEULE 10/19/21 0602 MW    Culture (A)  Final    STREPTOCOCCUS GALLOLYTICUS STAPHYLOCOCCUS EPIDERMIDIS THE SIGNIFICANCE OF ISOLATING THIS ORGANISM FROM A SINGLE SET OF BLOOD CULTURES WHEN MULTIPLE SETS ARE DRAWN IS UNCERTAIN. PLEASE NOTIFY THE MICROBIOLOGY DEPARTMENT WITHIN ONE WEEK IF SPECIATION AND SENSITIVITIES ARE REQUIRED. CRITICAL RESULT CALLED TO, READ BACK BY AND VERIFIED WITH: [place] M. [phone] [id number] FCP CULTURE REINCUBATED FOR BETTER GROWTH Performed at [facility], [place] [address], [place], [place] [address]    Report Status PENDING  Incomplete  Blood Culture ID Panel (Reflexed)     Status: Abnormal   Collection Time: 10/18/21  6:24 PM  Result Value Ref Range Status   Enterococcus faecalis NOT DETECTED NOT DETECTED  Final   Enterococcus Faecium NOT DETECTED NOT DETECTED  Final   Listeria monocytogenes NOT DETECTED NOT DETECTED Final   Staphylococcus species NOT DETECTED NOT DETECTED Final   Staphylococcus aureus (BCID) NOT DETECTED NOT DETECTED Final   Staphylococcus epidermidis NOT DETECTED NOT DETECTED Final   Staphylococcus lugdunensis NOT DETECTED NOT DETECTED Final   Streptococcus species DETECTED (A) NOT DETECTED Final    Comment: Not Enterococcus species, Streptococcus agalactiae, Streptococcus pyogenes, or Streptococcus pneumoniae. CRITICAL RESULT CALLED TO, READ BACK BY AND VERIFIED WITH: NATHAN BEULE 10/19/21 0602 MW    Streptococcus agalactiae NOT DETECTED NOT DETECTED Final   Streptococcus pneumoniae NOT DETECTED NOT DETECTED Final   Streptococcus pyogenes NOT DETECTED NOT DETECTED Final   A.calcoaceticus-baumannii NOT DETECTED NOT DETECTED Final   Bacteroides fragilis NOT DETECTED NOT DETECTED Final   Enterobacterales NOT DETECTED NOT DETECTED Final   Enterobacter cloacae complex NOT DETECTED NOT DETECTED Final   Escherichia coli NOT DETECTED NOT DETECTED Final   Klebsiella aerogenes NOT DETECTED NOT DETECTED Final   Klebsiella oxytoca NOT DETECTED NOT DETECTED Final   Klebsiella pneumoniae NOT DETECTED NOT DETECTED Final   Proteus species NOT DETECTED NOT DETECTED Final   Salmonella species NOT DETECTED NOT DETECTED Final   Serratia marcescens NOT DETECTED NOT DETECTED Final   Haemophilus influenzae NOT DETECTED NOT DETECTED Final   Neisseria meningitidis NOT DETECTED NOT DETECTED Final   Pseudomonas aeruginosa NOT DETECTED NOT DETECTED Final   Stenotrophomonas maltophilia NOT DETECTED NOT DETECTED Final   Candida albicans NOT DETECTED NOT DETECTED Final   Candida auris NOT DETECTED NOT DETECTED Final   Candida glabrata NOT DETECTED NOT DETECTED Final   Candida krusei NOT DETECTED NOT DETECTED Final   Candida parapsilosis NOT DETECTED NOT DETECTED Final   Candida tropicalis NOT DETECTED NOT DETECTED Final   Cryptococcus  neoformans/gattii NOT DETECTED NOT DETECTED Final    Comment: Performed at [facility], [address] [place], [place] [address]  Resp Panel by RT-PCR (Flu A&B, Covid) Nasopharyngeal Swab     Status: None   Collection Time: 10/18/21  8:16 PM   Specimen: Nasopharyngeal Swab; Nasopharyngeal(NP) swabs in vial transport medium  Result Value Ref Range Status   SARS Coronavirus 2 by RT PCR NEGATIVE NEGATIVE Final    Comment: (NOTE) SARS-CoV-2 target nucleic acids are NOT DETECTED.  The SARS-CoV-2 RNA is generally detectable in upper respiratory specimens during the acute phase of infection. The lowest concentration of SARS-CoV-2 viral copies this assay can detect is 138 copies/mL. A negative result does not preclude SARS-Cov-2 infection and should not be used as the sole basis for treatment or other patient management decisions. A negative result may occur with  improper specimen collection/handling, submission of specimen other than nasopharyngeal swab, presence of viral mutation(s) within the areas targeted by this assay, and inadequate number of viral copies(<138 copies/mL). A negative result must be combined with clinical observations, patient history, and epidemiological information. The expected result is Negative.  Fact Sheet for Patients:  EntrepreneurPulse.com.au  Fact Sheet for Healthcare Providers:  IncredibleEmployment.be  This test is no t yet approved or cleared by the [place] FDA and  has been authorized for detection and/or diagnosis of SARS-CoV-2 by FDA under an Emergency Use Authorization (EUA). This EUA will remain  in effect (meaning this test can be used) for the duration of the COVID-19 declaration under Section 564(b)(1) of the Act, 21 U.S.C.section 360bbb-3(b)(1), unless the authorization is terminated  or revoked sooner.  Influenza A by PCR NEGATIVE NEGATIVE Final   Influenza B by PCR NEGATIVE  NEGATIVE Final    Comment: (NOTE) The Xpert Xpress SARS-CoV-2/FLU/RSV plus assay is intended as an aid in the diagnosis of influenza from Nasopharyngeal swab specimens and should not be used as a sole basis for treatment. Nasal washings and aspirates are unacceptable for Xpert Xpress SARS-CoV-2/FLU/RSV testing.  Fact Sheet for Patients: EntrepreneurPulse.com.au  Fact Sheet for Healthcare Providers: IncredibleEmployment.be  This test is not yet approved or cleared by the [place] FDA and has been authorized for detection and/or diagnosis of SARS-CoV-2 by FDA under an Emergency Use Authorization (EUA). This EUA will remain in effect (meaning this test can be used) for the duration of the COVID-19 declaration under Section 564(b)(1) of the Act, 21 U.S.C. section 360bbb-3(b)(1), unless the authorization is terminated or revoked.  Performed at [facility], [address] [place], [place] [address]   Culture, blood (Routine X 2) w Reflex to ID Panel     Status: None (Preliminary result)   Collection Time: 10/18/21 11:28 PM   Specimen: BLOOD  Result Value Ref Range Status   Specimen Description BLOOD LEFT HAND  Final   Special Requests   Final    BOTTLES DRAWN AEROBIC ONLY Blood Culture adequate volume   Culture   Final    NO GROWTH 4 DAYS Performed at [facility], [address], [place], [place] [address]    Report Status PENDING  Incomplete  MRSA Next Gen by PCR, Nasal     Status: None   Collection Time: 10/19/21  5:04 AM   Specimen: Nasal Mucosa; Nasal Swab  Result Value Ref Range Status   MRSA by PCR Next Gen NOT DETECTED NOT DETECTED Final    Comment: (NOTE) The GeneXpert MRSA Assay (FDA approved for NASAL specimens only), is one component of a comprehensive MRSA colonization surveillance program. It is not intended to diagnose MRSA infection nor to guide or monitor treatment for MRSA infections. Test  performance is not FDA approved in patients less than 25 years old. Performed at [facility], [address] [place], [place] [address]   CULTURE, BLOOD (ROUTINE X 2) w Reflex to ID Panel     Status: None (Preliminary result)   Collection Time: 10/20/21  6:08 PM   Specimen: BLOOD  Result Value Ref Range Status   Specimen Description BLOOD LFOA  Final   Special Requests BOTTLES DRAWN AEROBIC AND ANAEROBIC BCAV  Final   Culture   Final    NO GROWTH 2 DAYS Performed at [facility], [address], [place], [place] [address]    Report Status PENDING  Incomplete  CULTURE, BLOOD (ROUTINE X 2) w Reflex to ID Panel     Status: None (Preliminary result)   Collection Time: 10/20/21  6:23 PM   Specimen: BLOOD  Result Value Ref Range Status   Specimen Description BLOOD [facility]  Final   Special Requests BOTTLES DRAWN AEROBIC ONLY BCLV  Final   Culture   Final    NO GROWTH 2 DAYS Performed at [facility], [address] [place], [place] [address]    Report Status PENDING  Incomplete       Radiology Studies: CT HUMERUS RIGHT WO CONTRAST  Result Date: 10/22/2021 CLINICAL DATA:  Chronic alcohol abuse.  Shoulder trauma. EXAM: CT OF THE RIGHT HUMERUS WITHOUT CONTRAST CT OF THE RIGHT FOREARM WITHOUT CONTRAST TECHNIQUE: Multidetector CT imaging was performed of the right humerus  according to the standard protocol. Multiplanar CT image reconstructions were also generated. Multidetector CT imaging was performed of the right forearm according to the standard protocol. Multiplanar CT image reconstructions were also generated. RADIATION DOSE REDUCTION: This exam was performed according to the departmental dose-optimization program which includes automated exposure control, adjustment of the mA and/or kV according to patient size and/or use of iterative reconstruction technique. COMPARISON:  None. FINDINGS: Bones/Joint/Cartilage Generalized osteopenia. Severely comminuted and  displaced fracture of the surgical neck of right proximal humerus. 4 cm of lateral displacement, 2 cm of superior displacement. Numerous fracture fragments are located along the anterior humeral head. Severely comminuted fracture of the greater tuberosity and lesser tuberosity. Fracture cleft extends to the superior articular surface. No glenohumeral dislocation. Mild arthropathy of the acromioclavicular joint. No acute fracture or dislocation of the radius and ulna. No elbow fracture or dislocation. 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 Ligaments Ligaments are suboptimally evaluated by CT. Muscles and Tendons Focal area of atrophy in the biceps muscle likely reflecting prior injury. Soft tissue edema in the biceps muscle concerning for muscle strain. Soft tissue No fluid collection or hematoma. No soft tissue mass. Severe soft tissue edema throughout the upper arm and forearm with skin thickening which may reflect reactive edema versus cellulitis. No drainable fluid collection. IMPRESSION: 1. Severely comminuted and displaced fracture of the surgical neck of right proximal humerus. 4 cm of lateral displacement, 2 cm of superior displacement. Numerous fracture fragments are located along the anterior humeral head. Severely comminuted fracture of the greater tuberosity and lesser tuberosity. Fracture cleft extends to the superior articular surface. 2.  No acute osseous injury of the right forearm. 3. 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 Electronically Signed   By: Kathreen Devoid M.D.   On: 10/22/2021 11:57   CT FOREARM RIGHT WO CONTRAST  Result Date: 10/22/2021 CLINICAL DATA:  Chronic alcohol abuse.  Shoulder trauma. EXAM: CT OF THE RIGHT HUMERUS WITHOUT CONTRAST CT OF THE RIGHT FOREARM WITHOUT CONTRAST TECHNIQUE: Multidetector CT imaging was performed of the right humerus according to the standard protocol. Multiplanar CT image  reconstructions were also generated. Multidetector CT imaging was performed of the right forearm according to the standard protocol. Multiplanar CT image reconstructions were also generated. RADIATION DOSE REDUCTION: This exam was performed according to the departmental dose-optimization program which includes automated exposure control, adjustment of the mA and/or kV according to patient size and/or use of iterative reconstruction technique. COMPARISON:  None. FINDINGS: Bones/Joint/Cartilage Generalized osteopenia. Severely comminuted and displaced fracture of the surgical neck of right proximal humerus. 4 cm of lateral displacement, 2 cm of superior displacement. Numerous fracture fragments are located along the anterior humeral head. Severely comminuted fracture of the greater tuberosity and lesser tuberosity. Fracture cleft extends to the superior articular surface. No glenohumeral dislocation. Mild arthropathy of the acromioclavicular joint. No acute fracture or dislocation of the radius and ulna. No elbow fracture or dislocation. 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 Ligaments Ligaments are suboptimally evaluated by CT. Muscles and Tendons Focal area of atrophy in the biceps muscle likely reflecting prior injury. Soft tissue edema in the biceps muscle concerning for muscle strain. Soft tissue No fluid collection or hematoma. No soft tissue mass. Severe soft tissue edema throughout the upper arm and forearm with skin thickening which may reflect reactive edema versus cellulitis. No drainable fluid collection. IMPRESSION: 1. Severely comminuted and displaced fracture of the surgical neck of right proximal  humerus. 4 cm of lateral displacement, 2 cm of superior displacement. Numerous fracture fragments are located along the anterior humeral head. Severely comminuted fracture of the greater tuberosity and lesser tuberosity. Fracture cleft extends to the superior articular  surface. 2.  No acute osseous injury of the right forearm. 3. 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 Electronically Signed   By: Kathreen Devoid M.D.   On: 10/22/2021 11:57      Scheduled Meds:  Chlorhexidine Gluconate Cloth  6 each Topical R5615   folic acid  1 mg Oral Daily   insulin aspart  0-15 Units Subcutaneous TID WC   LORazepam  0-4 mg Intravenous Q8H   multivitamin with minerals  1 tablet Oral Daily   nicotine  14 mg Transdermal Daily   pantoprazole  40 mg Oral QHS   spiritus frumenti  1 each Oral TID AC   thiamine  100 mg Oral Daily   Or   thiamine  100 mg Intravenous Daily   Continuous Infusions:  sodium chloride Stopped (10/19/21 0149)   sodium chloride     cefTRIAXone (ROCEPHIN)  IV 2 g (10/21/21 2041)     LOS: 4 days    Dessa Phi, DO [facility] 10/22/2021, 1:52 PM   Available via Epic secure chat 7am-7pm After these hours, please refer to coverage provider listed on [email or web address]

## 2021-10-22 NOTE — Progress Notes (Signed)
[facility]  ROUNDING NOTE   Subjective:   Edward Singleton is a 71 year old male with past medical history of alcohol abuse, diabetes, hypertension, gout, and chronic kidney disease stage IV.  Patient presents to the emergency department after being found down at his home, suspected for several days.  Patient is admitted for Rhabdomyolysis [M62.82]   Patient seen and evaluated at bedside in ICU Alert with some confusion Tolerating meals Denies shortness of breath, 2L [place]  Creatinine 1.9 Urine output 1.2L in 24 hours  Objective:  Vital signs in last 24 hours:  Temp:  [98.4 F (36.9 C)-99.3 F (37.4 C)] 98.7 F (37.1 C) (01/13 1134) Pulse Rate:  [90-109] 102 (01/13 1134) Resp:  [16-33] 18 (01/13 1134) BP: (102-132)/(65-89) 129/79 (01/13 1134) SpO2:  [91 %-100 %] 93 % (01/13 1134) Weight:  [66.2 kg] 66.2 kg (01/13 0500)  Weight change: -0.1 kg Filed Weights   10/20/21 0500 10/21/21 0500 10/22/21 0500  Weight: 72.5 kg 66.3 kg 66.2 kg    Intake/Output: I/O last 3 completed shifts: In: 1286.5 [P.O.:720; Blood:403.3; IV Piggyback:163.2] Out: 2230 [Urine:2230]   Intake/Output this shift:  Total I/O In: -  Out: 1050 [Urine:1050]  Physical Exam: General: NAD  Head: Normocephalic, atraumatic. Moist oral mucosal membranes  Eyes: Anicteric  Lungs:  Clear to auscultation, normal effort, 2L [place]  Heart: Regular rate and rhythm  Abdomen:  Soft, nontender  Extremities:  no peripheral edema.  Neurologic: Nonfocal, moving all four extremities  Skin: RUE ecchymosis       Basic Metabolic Panel: Recent Labs  Lab 10/18/21 1800 10/18/21 2016 10/19/21 0504 10/19/21 1402 10/20/21 0650 10/21/21 0534 10/22/21 0444  NA 134* 134* 130* 133* 138 132* 134*  K 3.5 3.5 2.8* 3.1*   3.1* 3.0* 3.2* 4.1  CL 91* 100 92* 94* 96* 94* 100  CO2 9* 10* 23 31 32 29 26  GLUCOSE 170* 192* 214* 122* 142* 161* 129*  BUN 64* 59* 59* 48* 35* 27* 23  CREATININE 4.30* 3.68* 3.32* 2.56*  2.18* 1.93* 1.91*  CALCIUM 8.1* 6.7* 7.5* 7.5* 8.0* 7.9* 8.4*  MG 2.6*  --  1.7  --   --   --  1.6*  PHOS  --  7.6* 4.9*  --   --   --   --      Liver Function Tests: Recent Labs  Lab 10/18/21 1800 10/19/21 0504 10/20/21 0650  AST 37 46* 42*  ALT 19 21 23   ALKPHOS 100 83 73  BILITOT 1.0 1.0 1.1  PROT 6.2* 5.3* 5.1*  ALBUMIN 3.4* 3.0* 2.7*    Recent Labs  Lab 10/18/21 1800 10/19/21 0504  LIPASE 57* 112*    No results for input(s): AMMONIA in the last 168 hours.  CBC: Recent Labs  Lab 10/18/21 1800 10/19/21 0504 10/20/21 0650 10/21/21 0534 10/22/21 0444  WBC 24.3* 18.4* 16.6* 18.6* 22.8*  NEUTROABS 20.4*  --   --   --   --   HGB 11.3* 8.6* 7.6* 7.2* 10.0*  HCT 34.4* 24.0* 22.0* 21.6* 29.3*  MCV 104.9* 94.5 97.3 100.0 96.7  PLT 275 240 180 200 266     Cardiac Enzymes: Recent Labs  Lab 10/18/21 1800 10/19/21 0504 10/20/21 0650 10/21/21 0534 10/22/21 0444  CKTOTAL 990* 2,341* 1,503* 861* 581*     BNP: Invalid input(s): POCBNP  CBG: Recent Labs  Lab 10/20/21 2125 10/21/21 0732 10/21/21 1117 10/21/21 1635 10/22/21 0727  GLUCAP 105* 126* 146* 102* 121*  Microbiology: Results for orders placed or performed during the hospital encounter of 10/18/21  Blood culture (routine x 2)     Status: Abnormal   Collection Time: 10/18/21  6:24 PM   Specimen: BLOOD  Result Value Ref Range Status   Specimen Description   Final    BLOOD FOOT Performed at [facility], [address], [place], [place] [address]    Special Requests   Final    BOTTLES DRAWN AEROBIC AND ANAEROBIC BCAV Performed at [facility], [address] [place], [place] [address]    Culture  Setup Time   Final    Organism ID to follow IN BOTH AEROBIC AND ANAEROBIC BOTTLES GRAM POSITIVE COCCI CRITICAL RESULT CALLED TO, READ BACK BY AND VERIFIED WITH: NATHAN BEULE 10/19/21 0602 MW    Culture (A)  Final    STREPTOCOCCUS GALLOLYTICUS STAPHYLOCOCCUS  EPIDERMIDIS THE SIGNIFICANCE OF ISOLATING THIS ORGANISM FROM A SINGLE SET OF BLOOD CULTURES WHEN MULTIPLE SETS ARE DRAWN IS UNCERTAIN. PLEASE NOTIFY THE MICROBIOLOGY DEPARTMENT WITHIN ONE WEEK IF SPECIATION AND SENSITIVITIES ARE REQUIRED. CRITICAL RESULT CALLED TO, READ BACK BY AND VERIFIED WITH: [place]. [phone] FCP Performed at [facility], [address], [place], [place] [address]    Report Status 10/21/2021 FINAL  Final  Blood Culture ID Panel (Reflexed)     Status: Abnormal   Collection Time: 10/18/21  6:24 PM  Result Value Ref Range Status   Enterococcus faecalis NOT DETECTED NOT DETECTED Final   Enterococcus Faecium NOT DETECTED NOT DETECTED Final   Listeria monocytogenes NOT DETECTED NOT DETECTED Final   Staphylococcus species NOT DETECTED NOT DETECTED Final   Staphylococcus aureus (BCID) NOT DETECTED NOT DETECTED Final   Staphylococcus epidermidis NOT DETECTED NOT DETECTED Final   Staphylococcus lugdunensis NOT DETECTED NOT DETECTED Final   Streptococcus species DETECTED (A) NOT DETECTED Final    Comment: Not Enterococcus species, Streptococcus agalactiae, Streptococcus pyogenes, or Streptococcus pneumoniae. CRITICAL RESULT CALLED TO, READ BACK BY AND VERIFIED WITH: NATHAN BEULE 10/19/21 0602 MW    Streptococcus agalactiae NOT DETECTED NOT DETECTED Final   Streptococcus pneumoniae NOT DETECTED NOT DETECTED Final   Streptococcus pyogenes NOT DETECTED NOT DETECTED Final   A.calcoaceticus-baumannii NOT DETECTED NOT DETECTED Final   Bacteroides fragilis NOT DETECTED NOT DETECTED Final   Enterobacterales NOT DETECTED NOT DETECTED Final   Enterobacter cloacae complex NOT DETECTED NOT DETECTED Final   Escherichia coli NOT DETECTED NOT DETECTED Final   Klebsiella aerogenes NOT DETECTED NOT DETECTED Final   Klebsiella oxytoca NOT DETECTED NOT DETECTED Final   Klebsiella pneumoniae NOT DETECTED NOT DETECTED Final   Proteus species NOT DETECTED NOT DETECTED Final    Salmonella species NOT DETECTED NOT DETECTED Final   Serratia marcescens NOT DETECTED NOT DETECTED Final   Haemophilus influenzae NOT DETECTED NOT DETECTED Final   Neisseria meningitidis NOT DETECTED NOT DETECTED Final   Pseudomonas aeruginosa NOT DETECTED NOT DETECTED Final   Stenotrophomonas maltophilia NOT DETECTED NOT DETECTED Final   Candida albicans NOT DETECTED NOT DETECTED Final   Candida auris NOT DETECTED NOT DETECTED Final   Candida glabrata NOT DETECTED NOT DETECTED Final   Candida krusei NOT DETECTED NOT DETECTED Final   Candida parapsilosis NOT DETECTED NOT DETECTED Final   Candida tropicalis NOT DETECTED NOT DETECTED Final   Cryptococcus neoformans/gattii NOT DETECTED NOT DETECTED Final    Comment: Performed at [facility], [address], [place], [place] [address]  Resp Panel by RT-PCR (Flu  A&B, Covid) Nasopharyngeal Swab     Status: None   Collection Time: 10/18/21  8:16 PM   Specimen: Nasopharyngeal Swab; Nasopharyngeal(NP) swabs in vial transport medium  Result Value Ref Range Status   SARS Coronavirus 2 by RT PCR NEGATIVE NEGATIVE Final    Comment: (NOTE) SARS-CoV-2 target nucleic acids are NOT DETECTED.  The SARS-CoV-2 RNA is generally detectable in upper respiratory specimens during the acute phase of infection. The lowest concentration of SARS-CoV-2 viral copies this assay can detect is 138 copies/mL. A negative result does not preclude SARS-Cov-2 infection and should not be used as the sole basis for treatment or other patient management decisions. A negative result may occur with  improper specimen collection/handling, submission of specimen other than nasopharyngeal swab, presence of viral mutation(s) within the areas targeted by this assay, and inadequate number of viral copies(<138 copies/mL). A negative result must be combined with clinical observations, patient history, and epidemiological information. The expected result is Negative.  Fact  Sheet for Patients:  EntrepreneurPulse.com.au  Fact Sheet for Healthcare Providers:  IncredibleEmployment.be  This test is no t yet approved or cleared by the [place] FDA and  has been authorized for detection and/or diagnosis of SARS-CoV-2 by FDA under an Emergency Use Authorization (EUA). This EUA will remain  in effect (meaning this test can be used) for the duration of the COVID-19 declaration under Section 564(b)(1) of the Act, 21 U.S.C.section 360bbb-3(b)(1), unless the authorization is terminated  or revoked sooner.       Influenza A by PCR NEGATIVE NEGATIVE Final   Influenza B by PCR NEGATIVE NEGATIVE Final    Comment: (NOTE) The Xpert Xpress SARS-CoV-2/FLU/RSV plus assay is intended as an aid in the diagnosis of influenza from Nasopharyngeal swab specimens and should not be used as a sole basis for treatment. Nasal washings and aspirates are unacceptable for Xpert Xpress SARS-CoV-2/FLU/RSV testing.  Fact Sheet for Patients: EntrepreneurPulse.com.au  Fact Sheet for Healthcare Providers: IncredibleEmployment.be  This test is not yet approved or cleared by the [place] FDA and has been authorized for detection and/or diagnosis of SARS-CoV-2 by FDA under an Emergency Use Authorization (EUA). This EUA will remain in effect (meaning this test can be used) for the duration of the COVID-19 declaration under Section 564(b)(1) of the Act, 21 U.S.C. section 360bbb-3(b)(1), unless the authorization is terminated or revoked.  Performed at [facility], [address] [place], [place] [address]   Culture, blood (Routine X 2) w Reflex to ID Panel     Status: None (Preliminary result)   Collection Time: 10/18/21 11:28 PM   Specimen: BLOOD  Result Value Ref Range Status   Specimen Description BLOOD LEFT HAND  Final   Special Requests   Final    BOTTLES DRAWN AEROBIC ONLY Blood Culture  adequate volume   Culture   Final    NO GROWTH 4 DAYS Performed at [facility], [address] [place], [place] [address]    Report Status PENDING  Incomplete  MRSA Next Gen by PCR, Nasal     Status: None   Collection Time: 10/19/21  5:04 AM   Specimen: Nasal Mucosa; Nasal Swab  Result Value Ref Range Status   MRSA by PCR Next Gen NOT DETECTED NOT DETECTED Final    Comment: (NOTE) The GeneXpert MRSA Assay (FDA approved for NASAL specimens only), is one component of a comprehensive MRSA colonization surveillance program. It is not intended to diagnose MRSA infection nor to guide or monitor  treatment for MRSA infections. Test performance is not FDA approved in patients less than 42 years old. Performed at [facility], [address] [place], [place] [address]   CULTURE, BLOOD (ROUTINE X 2) w Reflex to ID Panel     Status: None (Preliminary result)   Collection Time: 10/20/21  6:08 PM   Specimen: BLOOD  Result Value Ref Range Status   Specimen Description BLOOD LFOA  Final   Special Requests BOTTLES DRAWN AEROBIC AND ANAEROBIC BCAV  Final   Culture   Final    NO GROWTH 2 DAYS Performed at [facility], [address], [place], [place] [address]    Report Status PENDING  Incomplete  CULTURE, BLOOD (ROUTINE X 2) w Reflex to ID Panel     Status: None (Preliminary result)   Collection Time: 10/20/21  6:23 PM   Specimen: BLOOD  Result Value Ref Range Status   Specimen Description BLOOD [facility]  Final   Special Requests BOTTLES DRAWN AEROBIC ONLY BCLV  Final   Culture   Final    NO GROWTH 2 DAYS Performed at [facility], [name], [place], [place] [address]    Report Status PENDING  Incomplete    Coagulation Studies: No results for input(s): LABPROT, INR in the last 72 hours.  Urinalysis: No results for input(s): COLORURINE, LABSPEC, PHURINE, GLUCOSEU, HGBUR, BILIRUBINUR, KETONESUR, PROTEINUR, UROBILINOGEN, NITRITE, LEUKOCYTESUR in  the last 72 hours.  Invalid input(s): APPERANCEUR     Imaging: CT HUMERUS RIGHT WO CONTRAST  Result Date: 10/22/2021 CLINICAL DATA:  Chronic alcohol abuse.  Shoulder trauma. EXAM: CT OF THE RIGHT HUMERUS WITHOUT CONTRAST CT OF THE RIGHT FOREARM WITHOUT CONTRAST TECHNIQUE: Multidetector CT imaging was performed of the right humerus according to the standard protocol. Multiplanar CT image reconstructions were also generated. Multidetector CT imaging was performed of the right forearm according to the standard protocol. Multiplanar CT image reconstructions were also generated. RADIATION DOSE REDUCTION: This exam was performed according to the departmental dose-optimization program which includes automated exposure control, adjustment of the mA and/or kV according to patient size and/or use of iterative reconstruction technique. COMPARISON:  None. FINDINGS: Bones/Joint/Cartilage Generalized osteopenia. Severely comminuted and displaced fracture of the surgical neck of right proximal humerus. 4 cm of lateral displacement, 2 cm of superior displacement. Numerous fracture fragments are located along the anterior humeral head. Severely comminuted fracture of the greater tuberosity and lesser tuberosity. Fracture cleft extends to the superior articular surface. No glenohumeral dislocation. Mild arthropathy of the acromioclavicular joint. No acute fracture or dislocation of the radius and ulna. No elbow fracture or dislocation. 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 Ligaments Ligaments are suboptimally evaluated by CT. Muscles and Tendons Focal area of atrophy in the biceps muscle likely reflecting prior injury. Soft tissue edema in the biceps muscle concerning for muscle strain. Soft tissue No fluid collection or hematoma. No soft tissue mass. Severe soft tissue edema throughout the upper arm and forearm with skin thickening which may reflect reactive edema versus cellulitis. No  drainable fluid collection. IMPRESSION: 1. Severely comminuted and displaced fracture of the surgical neck of right proximal humerus. 4 cm of lateral displacement, 2 cm of superior displacement. Numerous fracture fragments are located along the anterior humeral head. Severely comminuted fracture of the greater tuberosity and lesser tuberosity. Fracture cleft extends to the superior articular surface. 2.  No acute osseous injury of the right forearm. 3. 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  Electronically Signed   By: Kathreen Devoid M.D.   On: 10/22/2021 11:57   CT FOREARM RIGHT WO CONTRAST  Result Date: 10/22/2021 CLINICAL DATA:  Chronic alcohol abuse.  Shoulder trauma. EXAM: CT OF THE RIGHT HUMERUS WITHOUT CONTRAST CT OF THE RIGHT FOREARM WITHOUT CONTRAST TECHNIQUE: Multidetector CT imaging was performed of the right humerus according to the standard protocol. Multiplanar CT image reconstructions were also generated. Multidetector CT imaging was performed of the right forearm according to the standard protocol. Multiplanar CT image reconstructions were also generated. RADIATION DOSE REDUCTION: This exam was performed according to the departmental dose-optimization program which includes automated exposure control, adjustment of the mA and/or kV according to patient size and/or use of iterative reconstruction technique. COMPARISON:  None. FINDINGS: Bones/Joint/Cartilage Generalized osteopenia. Severely comminuted and displaced fracture of the surgical neck of right proximal humerus. 4 cm of lateral displacement, 2 cm of superior displacement. Numerous fracture fragments are located along the anterior humeral head. Severely comminuted fracture of the greater tuberosity and lesser tuberosity. Fracture cleft extends to the superior articular surface. No glenohumeral dislocation. Mild arthropathy of the acromioclavicular joint. No acute fracture or dislocation of the radius and  ulna. No elbow fracture or dislocation. 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 Ligaments Ligaments are suboptimally evaluated by CT. Muscles and Tendons Focal area of atrophy in the biceps muscle likely reflecting prior injury. Soft tissue edema in the biceps muscle concerning for muscle strain. Soft tissue No fluid collection or hematoma. No soft tissue mass. Severe soft tissue edema throughout the upper arm and forearm with skin thickening which may reflect reactive edema versus cellulitis. No drainable fluid collection. IMPRESSION: 1. Severely comminuted and displaced fracture of the surgical neck of right proximal humerus. 4 cm of lateral displacement, 2 cm of superior displacement. Numerous fracture fragments are located along the anterior humeral head. Severely comminuted fracture of the greater tuberosity and lesser tuberosity. Fracture cleft extends to the superior articular surface. 2.  No acute osseous injury of the right forearm. 3. 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 Electronically Signed   By: Kathreen Devoid M.D.   On: 10/22/2021 11:57     Medications:    sodium chloride Stopped (10/19/21 0149)   sodium chloride     cefTRIAXone (ROCEPHIN)  IV 2 g (10/21/21 2041)   magnesium sulfate bolus IVPB      Chlorhexidine Gluconate Cloth  6 each Topical R0076   folic acid  1 mg Oral Daily   insulin aspart  0-15 Units Subcutaneous TID WC   LORazepam  0-4 mg Intravenous Q8H   multivitamin with minerals  1 tablet Oral Daily   nicotine  14 mg Transdermal Daily   pantoprazole  40 mg Oral QHS   spiritus frumenti  1 each Oral TID AC   thiamine  100 mg Oral Daily   Or   thiamine  100 mg Intravenous Daily   sodium chloride, albuterol, docusate sodium, fentaNYL (SUBLIMAZE) injection, HYDROmorphone, polyethylene glycol  Assessment/ Plan:  [name] is a 71 y.o.  male with past medical history of alcohol abuse, diabetes,  hypertension, gout, and chronic kidney disease stage IV.  Patient presents to the emergency department after being found down at his home, suspected for several days.  Patient is admitted for Rhabdomyolysis [M62.82]   Acute Kidney Injury on chronic kidney disease stage IV with baseline creatinine 2.58 and GFR of 26 on 05/13/21.  [facility]   Admission renal function 14%. Acute kidney injury secondary to rhabdomyolysis Renal ultrasound ordered to evaluate obstruction No IV contrast exposure.  No acute indication for dialysis at this time.  Creatinine stable with adequate urine output   Lab Results  Component Value Date   CREATININE 1.91 (H) 10/22/2021   CREATININE 1.93 (H) 10/21/2021   CREATININE 2.18 (H) 10/20/2021    Intake/Output Summary (Last 24 hours) at 10/22/2021 1201 Last data filed at 10/22/2021 1000 Gross per 24 hour  Intake 826.49 ml  Output 1725 ml  Net -898.51 ml    2. Anemia of chronic kidney disease Lab Results  Component Value Date   HGB 10.0 (L) 10/22/2021    Hgb at goal  3.  Hypertension with chronic kidney disease.  Home regimen includes amlodipine and metoprolol.  Both currently held at this time. BP stable   4.  Diabetes mellitus type 2 with chronic kidney disease.Noninsulin-dependent diabetic.  Home regimen includes metformin.  Hemoglobin A1c 6.2 on 10/19/2021.  Primary team to manage SSI    LOS: 4 [place] 1/13/202312:01 PM

## 2021-10-22 NOTE — Evaluation (Signed)
Occupational Therapy Evaluation Patient Details Name: Edward Singleton MRN: [id number] DOB: [birth year] Today's Date: 10/22/2021   History of Present Illness Pt is a 71 y/o M admitted on 10/18/21 after being found down in his house. Pt reports he had been drinking too much & fell. Pt was confused, hypothermic & hypotensive. Pt is being treated for septic 2/2 strep bacteremia & hypovolemic shock. Pt found to have closed comminuted R proximal humerus fx with recommendations to use R shoulder sling now & planning for transfer to [facility] once medically stable for surgical evaluation by Dr. Marcelino Scot & Dr. Doreatha Martin. Per ortho conversation with Dr. Doreatha Martin but will require arthroplasty but will schedule this as an outpatient. PMH: chronic alcohol use   Clinical Impression   Pt seen for OT evaluation this date in setting of acute hospitalization d/t fall with R humerus fx. He presents this date with decreased fxl activity tolerance, safety awareness, strength and significant ADL limitations 2/2 R UE immobilization and pain. He currently requires MOD A for bed mobility, MIN A for transfers with HHA, MOD A for UB ADLs and MAX A for LB ADLs. OT educates pt re: modifications, but he will require f/u. Anticipate he will require continued skilled OT in both acute setting and upon d/c in subacute setting.      Recommendations for follow up therapy are one component of a multi-disciplinary discharge planning process, led by the attending physician.  Recommendations may be updated based on patient status, additional functional criteria and insurance authorization.   Follow Up Recommendations  Skilled nursing-short term rehab (<3 hours/day)    Assistance Recommended at Discharge Frequent or constant Supervision/Assistance  Patient can return home with the following      Functional Status Assessment  Patient has had a recent decline in their functional status and demonstrates the ability to make significant  improvements in function in a reasonable and predictable amount of time.  Equipment Recommendations  Other (comment) (defer to next level  of care)    Recommendations for Other Services       Precautions / Restrictions Precautions Precautions: Fall Restrictions Weight Bearing Restrictions: Yes RUE Weight Bearing: Non weight bearing (in sling)      Mobility Bed Mobility Overal bed mobility: Needs Assistance Bed Mobility: Supine to Sit     Supine to sit: Mod assist;HOB elevated     General bed mobility comments: increased time, tactile cues to process commands (ex: confused about rolling L versus R)    Transfers Overall transfer level: Needs assistance Equipment used: 1 person hand held assist Transfers: Sit to/from Stand Sit to Stand: Min assist     Step pivot transfers: Min assist;Mod assist (LUE support, decreased speed of stepping)     General transfer comment: increased time, cues for sequence/safety.      Balance Overall balance assessment: Needs assistance Sitting-balance support: Feet supported;Single extremity supported Sitting balance-Leahy Scale: Fair     Standing balance support: Single extremity supported Standing balance-Leahy Scale: Poor Standing balance comment: shaking and retropulsing                           ADL either performed or assessed with clinical judgement   ADL                                         General ADL Comments:  requires MOD A for UB ADLs d/t R UE restrictions, MAX A for LB ADLs d/t limitations with balance and R UE. Requires MIN A for ADL transfers with HHA.     Vision Patient Visual Report: No change from baseline       Perception     Praxis      Pertinent Vitals/Pain Pain Assessment: Faces Faces Pain Scale: Hurts even more Pain Location: RUE Pain Descriptors / Indicators: Discomfort;Grimacing Pain Intervention(s): Monitored during session;Limited activity within patient's  tolerance     Hand Dominance     Extremity/Trunk Assessment Upper Extremity Assessment Upper Extremity Assessment: RUE deficits/detail;LUE deficits/detail RUE Deficits / Details: RUE with ecchymosis & pt c/o significant pain in RUE. RUE: Unable to fully assess due to immobilization LUE Deficits / Details: generally WFL ROM, but MMT grossly 4-/5   Lower Extremity Assessment Lower Extremity Assessment: Generalized weakness   Cervical / Trunk Assessment Cervical / Trunk Assessment: Normal   Communication Communication Communication: No difficulties   Cognition Arousal/Alertness: Awake/alert Behavior During Therapy: WFL for tasks assessed/performed Overall Cognitive Status: Within Functional Limits for tasks assessed                                 General Comments: Pt AxOx4 but appears to have some decreased insight - will assess more in functional context. Does endorse alcohol abuse.     General Comments       Exercises Other Exercises Other Exercises: OT ed re: role   Shoulder Instructions      Home Living Family/patient expects to be discharged to:: Skilled nursing facility Living Arrangements: Alone Available Help at Discharge: Family;Available PRN/intermittently Type of Home: House Home Access: Stairs to enter [employer] of Steps: 4 Entrance Stairs-Rails: Left Home Layout: One level               Home Equipment: Cane - single point          Prior Functioning/Environment Prior Level of Function : Independent/Modified Independent             Mobility Comments: Pt reports independence without AD, lives alone. Sister reports she bring him groceries & manages his finance because he has a hx of alcohol/substance abuse.          OT Problem List: Decreased strength;Decreased range of motion;Decreased activity tolerance;Impaired balance (sitting and/or standing);Decreased cognition;Decreased safety awareness;Decreased  knowledge of use of DME or AE;Decreased knowledge of precautions;Impaired UE functional use;Pain;Increased edema      OT Treatment/Interventions: Self-care/ADL training;Therapeutic exercise;DME and/or AE instruction;Therapeutic activities;Patient/family education;Balance training    OT Goals(Current goals can be found in the care plan section) Acute Rehab OT Goals Patient Stated Goal: manage pain and get better OT Goal Formulation: With patient Time For Goal Achievement: 11/05/21 Potential to Achieve Goals: Good ADL Goals Pt Will Perform Upper Body Bathing: with supervision;sitting (with <10% cues for modified technique) Pt Will Perform Upper Body Dressing: with supervision;sitting (with <10% cues for hemi-dsg technique) Pt Will Transfer to Toilet: with min guard assist;ambulating Additional ADL Goal #1: pt will voice correct understanding of sling application with <24% cues  OT Frequency: Min 2X/week    Co-evaluation              AM-PAC OT "6 Clicks" Daily Activity     Outcome Measure Help from another person eating meals?: A Little Help from another person taking care of personal grooming?: A Little Help from  another person toileting, which includes using toliet, bedpan, or urinal?: A Lot Help from another person bathing (including washing, rinsing, drying)?: A Lot Help from another person to put on and taking off regular upper body clothing?: A Lot Help from another person to put on and taking off regular lower body clothing?: A Lot 6 Click Score: 14   End of Session Nurse Communication: Mobility status  Activity Tolerance: Patient tolerated treatment well Patient left: in bed;with call bell/phone within reach;with bed alarm set;Other (comment) (positioned with pillows under his arm for support)  OT Visit Diagnosis: Unsteadiness on feet (R26.81);Muscle weakness (generalized) (M62.81);Pain Pain - Right/Left: Right Pain - part of body: Shoulder                Time:  3893-7342 OT Time Calculation (min): 33 min Charges:  OT General Charges $OT Visit: 1 Visit OT Evaluation $OT Eval Moderate Complexity: 1 Mod OT Treatments $Self Care/Home Management : 8-22 mins  Gerrianne Scale, MS, OTR/L ascom [phone] 10/22/21, 6:31 PM

## 2021-10-22 NOTE — Progress Notes (Signed)
Patient arrived from CCU.  Patient alert with some confusion. Telemetry and bed alarm in use.  Family at bedside, sister states is the HPOA. MD notified for requested update of POC for patient.    10/22/21 1134  Vitals  Temp 98.7 F (37.1 C)  Temp Source Oral  BP 129/79  MAP (mmHg) 92  BP Location Left Leg  BP Method Automatic  Patient Position (if appropriate) Lying  Pulse Rate (!) 102  Pulse Rate Source Monitor  Resp 18  MEWS COLOR  MEWS Score Color Green  Oxygen Therapy  SpO2 93 %  O2 Device Nasal Cannula  O2 Flow Rate (L/min) 2 L/min  MEWS Score  MEWS Temp 0  MEWS Systolic 0  MEWS Pulse 1  MEWS RR 0  MEWS LOC 0  MEWS Score 1

## 2021-10-22 NOTE — Progress Notes (Signed)
PT Cancellation Note  Patient Details Name: Edward Singleton MRN: [id number] DOB: 08/25/1951   Cancelled Treatment:    Reason Eval/Treat Not Completed: Other (comment) PT orders received, chart reviewed. Attempted to see pt but nurse reports pt just had foley catheter inserted & received ativan & dilaudid as pt did not rest last night; nurse requested PT return later. Will f/u as able.  Lavone Nian, PT, DPT 10/22/21, 9:59 AM    [name] 10/22/2021, 9:58 AM

## 2021-10-22 NOTE — Progress Notes (Signed)
ID Out of ICU Rt arm hurts Foley placed today due to urinary retention and 1000cc of urine drained O/e awake and alert Patient Vitals for the past 24 hrs:  BP Temp Temp src Pulse Resp SpO2 Weight  10/22/21 1134 129/79 98.7 F (37.1 C) Oral (!) 102 18 93 % --  10/22/21 1000 110/89 -- -- 97 (!) 28 99 % --  10/22/21 0800 130/83 99.1 F (37.3 C) -- (!) 109 (!) 33 91 % --  10/22/21 0500 -- -- -- -- -- -- 66.2 kg  10/22/21 0100 -- -- -- (!) 105 20 91 % --  10/22/21 0000 123/73 -- -- (!) 103 (!) 29 94 % --  10/21/21 2339 -- 99.3 F (37.4 C) Axillary (!) 101 (!) 23 92 % --  10/21/21 2200 107/66 -- -- 90 (!) 27 96 % --  10/21/21 2028 127/74 98.4 F (36.9 C) Oral (!) 102 20 100 % --  10/21/21 2000 132/65 98.4 F (36.9 C) Oral 94 (!) 28 100 % --  10/21/21 1836 103/78 98.4 F (36.9 C) Oral 96 (!) 24 97 % --  10/21/21 1815 102/72 98.4 F (36.9 C) Oral 98 (!) 24 94 % --  10/21/21 1800 103/72 -- -- 94 (!) 33 95 % --  10/21/21 1600 130/70 98.8 F (37.1 C) Oral (!) 103 (!) 23 92 % --  10/21/21 1400 115/73 -- -- (!) 104 16 96 % --    Rt arm ecchymosis Tender Chest b/l air entry Crepts bases HS- tachycardia Abd soft foley Labs CBC Latest Ref Rng & Units 10/22/2021 10/21/2021 10/20/2021  WBC 4.0 - 10.5 K/uL 22.8(H) 18.6(H) 16.6(H)  Hemoglobin 13.0 - 17.0 g/dL 10.0(L) 7.2(L) 7.6(L)  Hematocrit 39.0 - 52.0 % 29.3(L) 21.6(L) 22.0(L)  Platelets 150 - 400 K/uL 266 200 180    CMP Latest Ref Rng & Units 10/22/2021 10/21/2021 10/20/2021  Glucose 70 - 99 mg/dL 129(H) 161(H) 142(H)  BUN 8 - 23 mg/dL 23 27(H) 35(H)  Creatinine 0.61 - 1.24 mg/dL 1.91(H) 1.93(H) 2.18(H)  Sodium 135 - 145 mmol/L 134(L) 132(L) 138  Potassium 3.5 - 5.1 mmol/L 4.1 3.2(L) 3.0(L)  Chloride 98 - 111 mmol/L 100 94(L) 96(L)  CO2 22 - 32 mmol/L 26 29 32  Calcium 8.9 - 10.3 mg/dL 8.4(L) 7.9(L) 8.0(L)  Total Protein 6.5 - 8.1 g/dL - - 5.1(L)  Total Bilirubin 0.3 - 1.2 mg/dL - - 1.1  Alkaline Phos 38 - 126 U/L - - 73  AST 15 -  41 U/L - - 42(H)  ALT 0 - 44 U/L - - 23    Micro 10/18/21  BC strep gallolyticus 10/20/21 BC - sent   Impression/recommendation   Fall following alcohol consumption. Complicated fracture of the right humerus Severe bruising/ecchymosis of the right arm and shoulder with severe pain.  Watch for secondary infection.  Circulatory shock secondary to alcoholic ketosis, hypothermia and infection Received pressure support but now stable   Streptococcus Galloyticus bacteremia CT scan done on 10/18/2021 showed displaced and comminuted fracture of the proximal right humerus and comminuted fracture of the humeral head.  Diffuse subcutaneous edema of the right upper extremity and right axilla.  No fluid collection or large hematoma. Pt on ceftriaxone Repeat blood culture sent 2 d echo is not a good study This bacteria can be associated with Colon malignancy and has the potential to cause endocarditis Because of worsening leucocytosis  repeated  imaging of rt shoulder and arm and there was no abscess or collection- He  had acute urinary retention and 1000c urine drained after placement of foley. This could explain leucocytosis as well  Risk for pneumonia because of being bed bound and risk for aspiration- for incentive spirometry, oob, PT   History of splenectomy.  Rhabdomyolysis improving.   AKI on CKD.  Secondary to rhabdo, prerenal and infection..Improving    Diabetes mellitus on sliding scale   Discussed the management with patient and hospitalist ID will follow him peripherally this weekend. Call if needed

## 2021-10-23 LAB — BASIC METABOLIC PANEL
Anion gap: 11 (ref 5–15)
BUN: 19 mg/dL (ref 8–23)
CO2: 25 mmol/L (ref 22–32)
Calcium: 8.3 mg/dL — ABNORMAL LOW (ref 8.9–10.3)
Chloride: 96 mmol/L — ABNORMAL LOW (ref 98–111)
Creatinine, Ser: 1.65 mg/dL — ABNORMAL HIGH (ref 0.61–1.24)
GFR, Estimated: 45 mL/min — ABNORMAL LOW (ref >60)
Glucose, Bld: 115 mg/dL — ABNORMAL HIGH (ref 70–99)
Potassium: 3.7 mmol/L (ref 3.5–5.1)
Sodium: 132 mmol/L — ABNORMAL LOW (ref 135–145)

## 2021-10-23 LAB — GLUCOSE, CAPILLARY
Glucose-Capillary: 106 mg/dL — ABNORMAL HIGH (ref 70–99)
Glucose-Capillary: 120 mg/dL — ABNORMAL HIGH (ref 70–99)
Glucose-Capillary: 103 mg/dL — ABNORMAL HIGH (ref 70–99)

## 2021-10-23 LAB — CULTURE, BLOOD (ROUTINE X 2)
Culture: NO GROWTH
Special Requests: ADEQUATE

## 2021-10-23 LAB — CBC
HCT: 29.5 % — ABNORMAL LOW (ref 39.0–52.0)
Hemoglobin: 10 g/dL — ABNORMAL LOW (ref 13.0–17.0)
MCH: 32.9 pg (ref 26.0–34.0)
MCHC: 33.9 g/dL (ref 30.0–36.0)
MCV: 97 fL (ref 80.0–100.0)
Platelets: 290 10*3/uL (ref 150–400)
RBC: 3.04 MIL/uL — ABNORMAL LOW (ref 4.22–5.81)
RDW: 17.9 % — ABNORMAL HIGH (ref 11.5–15.5)
WBC: 19.5 10*3/uL — ABNORMAL HIGH (ref 4.0–10.5)
nRBC: 1.3 % — ABNORMAL HIGH (ref 0.0–0.2)

## 2021-10-23 LAB — MAGNESIUM: Magnesium: 2 mg/dL (ref 1.7–2.4)

## 2021-10-23 MED ORDER — TRAMADOL HCL 50 MG PO TABS
50.0000 mg | ORAL_TABLET | Freq: Two times a day (BID) | ORAL | Status: DC | PRN
  Administered 2021-10-23 – 2021-10-25 (×3): 50 mg via ORAL
  Filled 2021-10-23 (×3): qty 1

## 2021-10-23 MED ORDER — HYDROMORPHONE HCL 2 MG PO TABS
1.0000 mg | ORAL_TABLET | ORAL | Status: DC | PRN
  Administered 2021-10-23 – 2021-10-26 (×11): 1 mg via ORAL
  Filled 2021-10-23 (×12): qty 1

## 2021-10-23 NOTE — Progress Notes (Signed)
[facility]  ROUNDING NOTE   Subjective:   Complains of right arm pain.   UOP 2166mL.   Creatinine 1.65 (191) Na 132 (134)  Objective:  Vital signs in last 24 hours:  Temp:  [97.6 F (36.4 C)-98.6 F (37 C)] 97.6 F (36.4 C) (01/14 0755) Pulse Rate:  [95-105] 95 (01/14 0755) Resp:  [16-20] 18 (01/14 0755) BP: (122-137)/(74-86) 127/74 (01/14 0755) SpO2:  [91 %-92 %] 91 % (01/14 0755)  Weight change:  Filed Weights   10/20/21 0500 10/21/21 0500 10/22/21 0500  Weight: 72.5 kg 66.3 kg 66.2 kg    Intake/Output: I/O last 3 completed shifts: In: 673.2 [P.O.:120; Blood:403.3; IV Piggyback:149.9] Out: 2600 [Urine:2600]   Intake/Output this shift:  Total I/O In: 180 [P.O.:180] Out: -   Physical Exam: General: NAD, laying in bed  Head: Normocephalic, atraumatic. Moist oral mucosal membranes  Eyes: Anicteric  Lungs:  Clear to auscultation, normal effort, 4L [place] O2  Heart: Regular rate and rhythm  Abdomen:  Soft, nontender  Extremities:  no peripheral edema.  Neurologic: Nonfocal, moving all four extremities  Skin: RUE ecchymosis       Basic Metabolic Panel: Recent Labs  Lab 10/18/21 1800 10/18/21 2016 10/19/21 0504 10/19/21 1402 10/20/21 0650 10/21/21 0534 10/22/21 0444 10/23/21 0516  NA 134* 134* 130* 133* 138 132* 134* 132*  K 3.5 3.5 2.8* 3.1*   3.1* 3.0* 3.2* 4.1 3.7  CL 91* 100 92* 94* 96* 94* 100 96*  CO2 9* 10* 23 31 32 29 26 25   GLUCOSE 170* 192* 214* 122* 142* 161* 129* 115*  BUN 64* 59* 59* 48* 35* 27* 23 19  CREATININE 4.30* 3.68* 3.32* 2.56* 2.18* 1.93* 1.91* 1.65*  CALCIUM 8.1* 6.7* 7.5* 7.5* 8.0* 7.9* 8.4* 8.3*  MG 2.6*  --  1.7  --   --   --  1.6* 2.0  PHOS  --  7.6* 4.9*  --   --   --   --   --      Liver Function Tests: Recent Labs  Lab 10/18/21 1800 10/19/21 0504 10/20/21 0650  AST 37 46* 42*  ALT 19 21 23   ALKPHOS 100 83 73  BILITOT 1.0 1.0 1.1  PROT 6.2* 5.3* 5.1*  ALBUMIN 3.4* 3.0* 2.7*    Recent Labs   Lab 10/18/21 1800 10/19/21 0504  LIPASE 57* 112*    No results for input(s): AMMONIA in the last 168 hours.  CBC: Recent Labs  Lab 10/18/21 1800 10/19/21 0504 10/20/21 0650 10/21/21 0534 10/22/21 0444 10/23/21 0516  WBC 24.3* 18.4* 16.6* 18.6* 22.8* 19.5*  NEUTROABS 20.4*  --   --   --   --   --   HGB 11.3* 8.6* 7.6* 7.2* 10.0* 10.0*  HCT 34.4* 24.0* 22.0* 21.6* 29.3* 29.5*  MCV 104.9* 94.5 97.3 100.0 96.7 97.0  PLT 275 240 180 200 266 290     Cardiac Enzymes: Recent Labs  Lab 10/18/21 1800 10/19/21 0504 10/20/21 0650 10/21/21 0534 10/22/21 0444  CKTOTAL 990* 2,341* 1,503* 861* 581*     BNP: Invalid input(s): POCBNP  CBG: Recent Labs  Lab 10/22/21 0727 10/22/21 1211 10/22/21 1628 10/23/21 0859 10/23/21 1044  GLUCAP 121* 130* 136* 106* 120*     Microbiology: Results for orders placed or performed during the hospital encounter of 10/18/21  Blood culture (routine x 2)     Status: Abnormal (Preliminary result)   Collection Time: 10/18/21  6:24 PM   Specimen: BLOOD  Result Value  Ref Range Status   Specimen Description   Final    BLOOD FOOT Performed at [facility], [address], [place], [place] [address]    Special Requests   Final    BOTTLES DRAWN AEROBIC AND ANAEROBIC BCAV Performed at [facility], [address] [place], [place] [address]    Culture  Setup Time   Final    Organism ID to follow IN BOTH AEROBIC AND ANAEROBIC BOTTLES GRAM POSITIVE COCCI CRITICAL RESULT CALLED TO, READ BACK BY AND VERIFIED WITH: NATHAN BEULE 10/19/21 0602 MW    Culture (A)  Final    STREPTOCOCCUS GALLOLYTICUS STAPHYLOCOCCUS EPIDERMIDIS THE SIGNIFICANCE OF ISOLATING THIS ORGANISM FROM A SINGLE SET OF BLOOD CULTURES WHEN MULTIPLE SETS ARE DRAWN IS UNCERTAIN. PLEASE NOTIFY THE MICROBIOLOGY DEPARTMENT WITHIN ONE WEEK IF SPECIATION AND SENSITIVITIES ARE REQUIRED. CRITICAL RESULT CALLED TO, READ BACK BY AND VERIFIED WITH: [place]  [id number] FCP SUSCEPTIBILITIES TO FOLLOW Performed at [facility], [place] [address], [place], [place] [address]    Report Status PENDING  Incomplete  Blood Culture ID Panel (Reflexed)     Status: Abnormal   Collection Time: 10/18/21  6:24 PM  Result Value Ref Range Status   Enterococcus faecalis NOT DETECTED NOT DETECTED Final   Enterococcus Faecium NOT DETECTED NOT DETECTED Final   Listeria monocytogenes NOT DETECTED NOT DETECTED Final   Staphylococcus species NOT DETECTED NOT DETECTED Final   Staphylococcus aureus (BCID) NOT DETECTED NOT DETECTED Final   Staphylococcus epidermidis NOT DETECTED NOT DETECTED Final   Staphylococcus lugdunensis NOT DETECTED NOT DETECTED Final   Streptococcus species DETECTED (A) NOT DETECTED Final    Comment: Not Enterococcus species, Streptococcus agalactiae, Streptococcus pyogenes, or Streptococcus pneumoniae. CRITICAL RESULT CALLED TO, READ BACK BY AND VERIFIED WITH: NATHAN BEULE 10/19/21 0602 MW    Streptococcus agalactiae NOT DETECTED NOT DETECTED Final   Streptococcus pneumoniae NOT DETECTED NOT DETECTED Final   Streptococcus pyogenes NOT DETECTED NOT DETECTED Final   A.calcoaceticus-baumannii NOT DETECTED NOT DETECTED Final   Bacteroides fragilis NOT DETECTED NOT DETECTED Final   Enterobacterales NOT DETECTED NOT DETECTED Final   Enterobacter cloacae complex NOT DETECTED NOT DETECTED Final   Escherichia coli NOT DETECTED NOT DETECTED Final   Klebsiella aerogenes NOT DETECTED NOT DETECTED Final   Klebsiella oxytoca NOT DETECTED NOT DETECTED Final   Klebsiella pneumoniae NOT DETECTED NOT DETECTED Final   Proteus species NOT DETECTED NOT DETECTED Final   Salmonella species NOT DETECTED NOT DETECTED Final   Serratia marcescens NOT DETECTED NOT DETECTED Final   Haemophilus influenzae NOT DETECTED NOT DETECTED Final   Neisseria meningitidis NOT DETECTED NOT DETECTED Final   Pseudomonas aeruginosa NOT DETECTED NOT DETECTED Final   Stenotrophomonas  maltophilia NOT DETECTED NOT DETECTED Final   Candida albicans NOT DETECTED NOT DETECTED Final   Candida auris NOT DETECTED NOT DETECTED Final   Candida glabrata NOT DETECTED NOT DETECTED Final   Candida krusei NOT DETECTED NOT DETECTED Final   Candida parapsilosis NOT DETECTED NOT DETECTED Final   Candida tropicalis NOT DETECTED NOT DETECTED Final   Cryptococcus neoformans/gattii NOT DETECTED NOT DETECTED Final    Comment: Performed at [facility], [address] [place], [place] [address]  Resp Panel by RT-PCR (Flu A&B, Covid) Nasopharyngeal Swab     Status: None   Collection Time: 10/18/21  8:16 PM   Specimen: Nasopharyngeal Swab; Nasopharyngeal(NP) swabs in vial transport medium  Result Value Ref Range Status   SARS  Coronavirus 2 by RT PCR NEGATIVE NEGATIVE Final    Comment: (NOTE) SARS-CoV-2 target nucleic acids are NOT DETECTED.  The SARS-CoV-2 RNA is generally detectable in upper respiratory specimens during the acute phase of infection. The lowest concentration of SARS-CoV-2 viral copies this assay can detect is 138 copies/mL. A negative result does not preclude SARS-Cov-2 infection and should not be used as the sole basis for treatment or other patient management decisions. A negative result may occur with  improper specimen collection/handling, submission of specimen other than nasopharyngeal swab, presence of viral mutation(s) within the areas targeted by this assay, and inadequate number of viral copies(<138 copies/mL). A negative result must be combined with clinical observations, patient history, and epidemiological information. The expected result is Negative.  Fact Sheet for Patients:  EntrepreneurPulse.com.au  Fact Sheet for Healthcare Providers:  IncredibleEmployment.be  This test is no t yet approved or cleared by the [place] FDA and  has been authorized for detection and/or diagnosis of SARS-CoV-2 by FDA  under an Emergency Use Authorization (EUA). This EUA will remain  in effect (meaning this test can be used) for the duration of the COVID-19 declaration under Section 564(b)(1) of the Act, 21 U.S.C.section 360bbb-3(b)(1), unless the authorization is terminated  or revoked sooner.       Influenza A by PCR NEGATIVE NEGATIVE Final   Influenza B by PCR NEGATIVE NEGATIVE Final    Comment: (NOTE) The Xpert Xpress SARS-CoV-2/FLU/RSV plus assay is intended as an aid in the diagnosis of influenza from Nasopharyngeal swab specimens and should not be used as a sole basis for treatment. Nasal washings and aspirates are unacceptable for Xpert Xpress SARS-CoV-2/FLU/RSV testing.  Fact Sheet for Patients: EntrepreneurPulse.com.au  Fact Sheet for Healthcare Providers: IncredibleEmployment.be  This test is not yet approved or cleared by the [place] FDA and has been authorized for detection and/or diagnosis of SARS-CoV-2 by FDA under an Emergency Use Authorization (EUA). This EUA will remain in effect (meaning this test can be used) for the duration of the COVID-19 declaration under Section 564(b)(1) of the Act, 21 U.S.C. section 360bbb-3(b)(1), unless the authorization is terminated or revoked.  Performed at [facility], [address] [place], [place] [address]   Culture, blood (Routine X 2) w Reflex to ID Panel     Status: None   Collection Time: 10/18/21 11:28 PM   Specimen: BLOOD  Result Value Ref Range Status   Specimen Description BLOOD LEFT HAND  Final   Special Requests   Final    BOTTLES DRAWN AEROBIC ONLY Blood Culture adequate volume   Culture   Final    NO GROWTH 5 DAYS Performed at [facility], [address], [place], [place] [address]    Report Status 10/23/2021 FINAL  Final  MRSA Next Gen by PCR, Nasal     Status: None   Collection Time: 10/19/21  5:04 AM   Specimen: Nasal Mucosa; Nasal Swab  Result Value  Ref Range Status   MRSA by PCR Next Gen NOT DETECTED NOT DETECTED Final    Comment: (NOTE) The GeneXpert MRSA Assay (FDA approved for NASAL specimens only), is one component of a comprehensive MRSA colonization surveillance program. It is not intended to diagnose MRSA infection nor to guide or monitor treatment for MRSA infections. Test performance is not FDA approved in patients less than 78 years old. Performed at [facility], [address] [place], [place] [address]   CULTURE, BLOOD (ROUTINE X 2) w Reflex to  ID Panel     Status: None (Preliminary result)   Collection Time: 10/20/21  6:08 PM   Specimen: BLOOD  Result Value Ref Range Status   Specimen Description BLOOD LFOA  Final   Special Requests BOTTLES DRAWN AEROBIC AND ANAEROBIC BCAV  Final   Culture   Final    NO GROWTH 3 DAYS Performed at [facility], [address] [place], [place] [address]    Report Status PENDING  Incomplete  CULTURE, BLOOD (ROUTINE X 2) w Reflex to ID Panel     Status: None (Preliminary result)   Collection Time: 10/20/21  6:23 PM   Specimen: BLOOD  Result Value Ref Range Status   Specimen Description BLOOD [facility]  Final   Special Requests BOTTLES DRAWN AEROBIC ONLY BCLV  Final   Culture   Final    NO GROWTH 3 DAYS Performed at [facility], [address], [place], [place] [address]    Report Status PENDING  Incomplete    Coagulation Studies: No results for input(s): LABPROT, INR in the last 72 hours.  Urinalysis: No results for input(s): COLORURINE, LABSPEC, PHURINE, GLUCOSEU, HGBUR, BILIRUBINUR, KETONESUR, PROTEINUR, UROBILINOGEN, NITRITE, LEUKOCYTESUR in the last 72 hours.  Invalid input(s): APPERANCEUR     Imaging: CT HUMERUS RIGHT WO CONTRAST  Result Date: 10/22/2021 CLINICAL DATA:  Chronic alcohol abuse.  Shoulder trauma. EXAM: CT OF THE RIGHT HUMERUS WITHOUT CONTRAST CT OF THE RIGHT FOREARM WITHOUT CONTRAST TECHNIQUE: Multidetector CT imaging was  performed of the right humerus according to the standard protocol. Multiplanar CT image reconstructions were also generated. Multidetector CT imaging was performed of the right forearm according to the standard protocol. Multiplanar CT image reconstructions were also generated. RADIATION DOSE REDUCTION: This exam was performed according to the departmental dose-optimization program which includes automated exposure control, adjustment of the mA and/or kV according to patient size and/or use of iterative reconstruction technique. COMPARISON:  None. FINDINGS: Bones/Joint/Cartilage Generalized osteopenia. Severely comminuted and displaced fracture of the surgical neck of right proximal humerus. 4 cm of lateral displacement, 2 cm of superior displacement. Numerous fracture fragments are located along the anterior humeral head. Severely comminuted fracture of the greater tuberosity and lesser tuberosity. Fracture cleft extends to the superior articular surface. No glenohumeral dislocation. Mild arthropathy of the acromioclavicular joint. No acute fracture or dislocation of the radius and ulna. No elbow fracture or dislocation. 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 Ligaments Ligaments are suboptimally evaluated by CT. Muscles and Tendons Focal area of atrophy in the biceps muscle likely reflecting prior injury. Soft tissue edema in the biceps muscle concerning for muscle strain. Soft tissue No fluid collection or hematoma. No soft tissue mass. Severe soft tissue edema throughout the upper arm and forearm with skin thickening which may reflect reactive edema versus cellulitis. No drainable fluid collection. IMPRESSION: 1. Severely comminuted and displaced fracture of the surgical neck of right proximal humerus. 4 cm of lateral displacement, 2 cm of superior displacement. Numerous fracture fragments are located along the anterior humeral head. Severely comminuted fracture of the greater  tuberosity and lesser tuberosity. Fracture cleft extends to the superior articular surface. 2.  No acute osseous injury of the right forearm. 3. 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 Electronically Signed   By: Kathreen Devoid M.D.   On: 10/22/2021 11:57   CT FOREARM RIGHT WO CONTRAST  Result Date: 10/22/2021 CLINICAL DATA:  Chronic alcohol abuse.  Shoulder trauma. EXAM: CT OF THE RIGHT  HUMERUS WITHOUT CONTRAST CT OF THE RIGHT FOREARM WITHOUT CONTRAST TECHNIQUE: Multidetector CT imaging was performed of the right humerus according to the standard protocol. Multiplanar CT image reconstructions were also generated. Multidetector CT imaging was performed of the right forearm according to the standard protocol. Multiplanar CT image reconstructions were also generated. RADIATION DOSE REDUCTION: This exam was performed according to the departmental dose-optimization program which includes automated exposure control, adjustment of the mA and/or kV according to patient size and/or use of iterative reconstruction technique. COMPARISON:  None. FINDINGS: Bones/Joint/Cartilage Generalized osteopenia. Severely comminuted and displaced fracture of the surgical neck of right proximal humerus. 4 cm of lateral displacement, 2 cm of superior displacement. Numerous fracture fragments are located along the anterior humeral head. Severely comminuted fracture of the greater tuberosity and lesser tuberosity. Fracture cleft extends to the superior articular surface. No glenohumeral dislocation. Mild arthropathy of the acromioclavicular joint. No acute fracture or dislocation of the radius and ulna. No elbow fracture or dislocation. 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 Ligaments Ligaments are suboptimally evaluated by CT. Muscles and Tendons Focal area of atrophy in the biceps muscle likely reflecting prior injury. Soft tissue edema in the biceps muscle concerning for  muscle strain. Soft tissue No fluid collection or hematoma. No soft tissue mass. Severe soft tissue edema throughout the upper arm and forearm with skin thickening which may reflect reactive edema versus cellulitis. No drainable fluid collection. IMPRESSION: 1. Severely comminuted and displaced fracture of the surgical neck of right proximal humerus. 4 cm of lateral displacement, 2 cm of superior displacement. Numerous fracture fragments are located along the anterior humeral head. Severely comminuted fracture of the greater tuberosity and lesser tuberosity. Fracture cleft extends to the superior articular surface. 2.  No acute osseous injury of the right forearm. 3. 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 Electronically Signed   By: Kathreen Devoid M.D.   On: 10/22/2021 11:57     Medications:    sodium chloride Stopped (10/19/21 0149)   sodium chloride     cefTRIAXone (ROCEPHIN)  IV 2 g (10/22/21 2351)    Chlorhexidine Gluconate Cloth  6 each Topical [id number]   folic acid  1 mg Oral Daily   insulin aspart  0-15 Units Subcutaneous TID WC   multivitamin with minerals  1 tablet Oral Daily   nicotine  14 mg Transdermal Daily   pantoprazole  40 mg Oral QHS   spiritus frumenti  1 each Oral BID AC   thiamine  100 mg Oral Daily   Or   thiamine  100 mg Intravenous Daily   sodium chloride, albuterol, docusate sodium, HYDROmorphone, polyethylene glycol, traMADol  Assessment/ Plan:  Edward Singleton is a 71 y.o. white male with alcohol abuse, diabetes mellitus type II, hypertension, gout, dementia, gout, hyperlipidemia who is admitted to [facility] on 10/18/2021 for Rhabdomyolysis [M62.82]  Acute Kidney Injury on chronic kidney disease stage IV with baseline creatinine 2.58 and GFR of 26 on 05/13/21.  Patient currently followed by nephrology at [facility]. Acute kidney injury secondary to rhabdomyolysis.  - continue IV fluids - no acute indication for dialysis  Rhabdomyolysis: CKD  has trended down to 581 yesterday.   Anemia with chronic kidney disease: status post PRBC transfusion on 1/12. Hemoglobin 10. Macrocytic on admission.   Diabetes mellitus type II with chronic kidney disease: noninsulin dependent. Hemoglobin A1c of 6.2%. Holding metformin.   Hypertension with chronic kidney disease: 127/74. Holding home regimen of amlodipine and  metoprolol.   Gout: no acute gout flare. Currently off allopurinol.    LOS: 5 Edward Singleton 1/14/202312:31 PM

## 2021-10-23 NOTE — Progress Notes (Signed)
PROGRESS NOTE    Edward Singleton  YPP:[id number] DOB: [birth year] DOA: 10/18/2021 PCP: Windy Fast, MD     Brief Narrative:  Edward Singleton is a 71 year old male with long history of chronic alcohol use was reportedly found down in his house.  He was last seen well several days ago.  The patient bedside is alert and responsive but confused as to the timetable of the last few days.  He reports that he had been drinking too much alcohol and fell.  He believes he fell on Sunday but is unclear as to what happened or the timing of the event.  Police were called to the home and had to break down the door finding him on the floor with significant bruising lying on his right arm.  Per ED/EMS report the patient was confused, hypothermic and hypotensive receiving IV fluids.  Per Tamela Oddi, who is healthcare power of attorney, the patient has been binge drinking since the summer.  Upon arrival in the ER patient complaining of cold and thirst, became nauseous with some vomiting episodes.  He received warmed IV fluid, empiric antibiotics.  On-call orthopedist was consulted with additional imaging ordered and instructions to place right arm in a sling.  After aggressive IV fluid resuscitation patient requiring vasopressor support.  He was weaned off levophed on 1/10 and transferred to [facility] service 1/11.   New events last 24 hours / Subjective: Patient without new complaints, continues to have pain in his right upper extremity and asking for pain medication.  Had an extended discussion with sister who is his power of attorney.  We discussed patient's previous mental health history including undiagnosed PTSD, severe alcohol abuse, trauma.  She believes that he is not safe to return back home to live alone.  She currently manages his finances, he no longer drives.  She is unable to care for him on a day-to-day basis and is asking for SNF placement.  She is also very concerned regarding orthopedic surgery's decision  to electively treat his right shoulder fracture.  We also discussed his decision for full CODE STATUS.  For now, we will await Dr. Mack Guise to come back on Monday to discuss with sister regarding orthopedic surgery's decision.  Also consulted palliative care medicine to further delineate goals of care prior to discharge.  TOC to work on SNF placement for eventual discharge planning.  Assessment & Plan:   Principal Problem:   Rhabdomyolysis Active Problems:   AKI (acute kidney injury) ([place])   Metabolic acidosis   Shock ([place])   Septic and hypovolemic shock Sepsis secondary to strep bacteremia  -Now off levophed  -Blood culture showing strep gallolyticus, starph epidermidis 1/9 -Repeat blood culture negative 1/11 -Infectious disease following -Remains on ceftriaxone -CT right upper extremity completed which revealed reactive edema versus cellulitis, no drainable fluid collection. -Patient remains afebrile, improving today   Rhabdomyolysis -CK improved  Closed comminuted right proximal humerus fracture -Orthopedic surgery consulted, initially had planned on transferring to [facility] for surgical fixation, but now recommendation is for outpatient follow-up with elective repair -Remains in right shoulder sling for now -PT recommending SNF placement -Pain medication regimen adjusted today  Alcohol abuse with risk for DTs -Continue to monitor withdrawal on CIWA protocol -Receiving alcohol TID --> deescalate to BID   AKI on CKD stage IV -Baseline creatinine 2.6 -Nephrology following -Improving   Acute urinary retention -Foley catheter placed 1/13 -He will need voiding trial prior to discharge  Symptomatic anemia, acute blood  loss anemia  -Likely some blood loss component into hematoma of the right upper extremity -Hemoglobin 11.3 >> 8.6 >> 7.6 >> 7.2  -Transfuse 1 unit packed red blood cell in setting of hypoxemia and anemia -Chest x-ray did not reveal pulmonary  edema -Change subq hep to SCDs  Acute hypoxic respiratory failure -Remains on O2   Tobacco abuse -Nicotine patch    DVT prophylaxis: SCD  Code Status: Full Family Communication: Discussion with sister/healthcare power of attorney over the phone today 29 minutes  Disposition Plan:  Status is: Inpatient  Remains inpatient appropriate because: Remains on IV antibiotics, needs SNF placement     Antimicrobials:  Anti-infectives (From admission, onward)    Start     Dose/Rate Route Frequency Ordered Stop   10/20/21 2000  cefTRIAXone (ROCEPHIN) 2 g in sodium chloride 0.9 % 100 mL IVPB        2 g 200 mL/hr over 30 Minutes Intravenous Every 24 hours 10/20/21 0824     10/19/21 2000  ceFEPIme (MAXIPIME) 2 g in sodium chloride 0.9 % 100 mL IVPB  Status:  Discontinued        2 g 200 mL/hr over 30 Minutes Intravenous Every 24 hours 10/19/21 1407 10/20/21 0824   10/19/21 0700  vancomycin (VANCOCIN) IVPB 1000 mg/200 mL premix  Status:  Discontinued        1,000 mg 200 mL/hr over 60 Minutes Intravenous  Once 10/19/21 0606 10/19/21 0609   10/19/21 0700  vancomycin (VANCOREADY) IVPB 750 mg/150 mL        750 mg 150 mL/hr over 60 Minutes Intravenous  Once 10/19/21 0609 10/19/21 0729   10/18/21 1815  ceFEPIme (MAXIPIME) 2 g in sodium chloride 0.9 % 100 mL IVPB        2 g 200 mL/hr over 30 Minutes Intravenous  Once 10/18/21 1812 10/18/21 2023   10/18/21 1815  metroNIDAZOLE (FLAGYL) IVPB 500 mg        500 mg 100 mL/hr over 60 Minutes Intravenous  Once 10/18/21 1812 10/18/21 2225   10/18/21 1815  vancomycin (VANCOCIN) IVPB 1000 mg/200 mL premix        1,000 mg 200 mL/hr over 60 Minutes Intravenous  Once 10/18/21 1812 10/18/21 2326        Objective: Vitals:   10/22/21 1500 10/22/21 1918 10/23/21 0419 10/23/21 0755  BP: 134/86 137/84 122/77 127/74  Pulse: (!) 105 (!) 103 99 95  Resp:  16 20 18   Temp: 97.9 F (36.6 C) 98.6 F (37 C) 98.6 F (37 C) 97.6 F (36.4 C)  TempSrc: Oral  Oral Oral   SpO2: 92% 91% 92% 91%  Weight:      Height:        Intake/Output Summary (Last 24 hours) at 10/23/2021 0939 Last data filed at 10/23/2021 0507 Gross per 24 hour  Intake 206.71 ml  Output 2100 ml  Net -1893.29 ml    Filed Weights   10/20/21 0500 10/21/21 0500 10/22/21 0500  Weight: 72.5 kg 66.3 kg 66.2 kg    Examination: General exam: Appears calm and comfortable  Respiratory system: Clear to auscultation. Respiratory effort normal. Cardiovascular system: S1 & S2 heard, RR. No pedal edema. Gastrointestinal system: Abdomen is nondistended, soft and nontender. Normal bowel sounds heard. Central nervous system: Alert and oriented. Non focal exam. Speech clear  Extremities: RUE with swelling and diffuse ecchymosis  Psychiatry: Mood & affect appropriate.    Data Reviewed: I have personally reviewed following labs and imaging studies  CBC: Recent Labs  Lab 10/18/21 1800 10/19/21 0504 10/20/21 0650 10/21/21 0534 10/22/21 0444 10/23/21 0516  WBC 24.3* 18.4* 16.6* 18.6* 22.8* 19.5*  NEUTROABS 20.4*  --   --   --   --   --   HGB 11.3* 8.6* 7.6* 7.2* 10.0* 10.0*  HCT 34.4* 24.0* 22.0* 21.6* 29.3* 29.5*  MCV 104.9* 94.5 97.3 100.0 96.7 97.0  PLT 275 240 180 200 266 124    Basic Metabolic Panel: Recent Labs  Lab 10/18/21 1800 10/18/21 2016 10/19/21 0504 10/19/21 1402 10/20/21 0650 10/21/21 0534 10/22/21 0444 10/23/21 0516  NA 134* 134* 130* 133* 138 132* 134* 132*  K 3.5 3.5 2.8* 3.1*   3.1* 3.0* 3.2* 4.1 3.7  CL 91* 100 92* 94* 96* 94* 100 96*  CO2 9* 10* 23 31 32 29 26 25   GLUCOSE 170* 192* 214* 122* 142* 161* 129* 115*  BUN 64* 59* 59* 48* 35* 27* 23 19  CREATININE 4.30* 3.68* 3.32* 2.56* 2.18* 1.93* 1.91* 1.65*  CALCIUM 8.1* 6.7* 7.5* 7.5* 8.0* 7.9* 8.4* 8.3*  MG 2.6*  --  1.7  --   --   --  1.6* 2.0  PHOS  --  7.6* 4.9*  --   --   --   --   --     GFR: Estimated Creatinine Clearance: 39.6 mL/min (A) (by C-G formula based on SCr of 1.65 mg/dL  (H)). Liver Function Tests: Recent Labs  Lab 10/18/21 1800 10/19/21 0504 10/20/21 0650  AST 37 46* 42*  ALT 19 21 23   ALKPHOS 100 83 73  BILITOT 1.0 1.0 1.1  PROT 6.2* 5.3* 5.1*  ALBUMIN 3.4* 3.0* 2.7*    Recent Labs  Lab 10/18/21 1800 10/19/21 0504  LIPASE 57* 112*    No results for input(s): AMMONIA in the last 168 hours. Coagulation Profile: No results for input(s): INR, PROTIME in the last 168 hours. Cardiac Enzymes: Recent Labs  Lab 10/18/21 1800 10/19/21 0504 10/20/21 0650 10/21/21 0534 10/22/21 0444  CKTOTAL 990* 2,341* 1,503* 861* 581*    BNP (last 3 results) No results for input(s): PROBNP in the last 8760 hours. HbA1C: No results for input(s): HGBA1C in the last 72 hours.  CBG: Recent Labs  Lab 10/21/21 1635 10/22/21 0727 10/22/21 1211 10/22/21 1628 10/23/21 0859  GLUCAP 102* 121* 130* 136* 106*    Lipid Profile: No results for input(s): CHOL, HDL, LDLCALC, TRIG, CHOLHDL, LDLDIRECT in the last 72 hours. Thyroid Function Tests: No results for input(s): TSH, T4TOTAL, FREET4, T3FREE, THYROIDAB in the last 72 hours.  Anemia Panel: No results for input(s): VITAMINB12, FOLATE, FERRITIN, TIBC, IRON, RETICCTPCT in the last 72 hours. Sepsis Labs: Recent Labs  Lab 10/18/21 1800 10/18/21 1824 10/18/21 1950 10/19/21 0125 10/19/21 0504 10/20/21 0650  PROCALCITON 1.25  --   --   --  1.34 0.89  LATICACIDVEN  --  8.2* 5.3* 2.1*  --   --      Recent Results (from the past 240 hour(s))  Blood culture (routine x 2)     Status: Abnormal (Preliminary result)   Collection Time: 10/18/21  6:24 PM   Specimen: BLOOD  Result Value Ref Range Status   Specimen Description   Final    BLOOD FOOT Performed at [facility], [address] [place], [place] [address]    Special Requests   Final    BOTTLES DRAWN AEROBIC AND ANAEROBIC BCAV Performed at [facility], [address], [place], [place] [address]  Culture  Setup Time    Final    Organism ID to follow IN BOTH AEROBIC AND ANAEROBIC BOTTLES GRAM POSITIVE COCCI CRITICAL RESULT CALLED TO, READ BACK BY AND VERIFIED WITH: NATHAN BEULE 10/19/21 0602 MW    Culture (A)  Final    STREPTOCOCCUS GALLOLYTICUS STAPHYLOCOCCUS EPIDERMIDIS THE SIGNIFICANCE OF ISOLATING THIS ORGANISM FROM A SINGLE SET OF BLOOD CULTURES WHEN MULTIPLE SETS ARE DRAWN IS UNCERTAIN. PLEASE NOTIFY THE MICROBIOLOGY DEPARTMENT WITHIN ONE WEEK IF SPECIATION AND SENSITIVITIES ARE REQUIRED. CRITICAL RESULT CALLED TO, READ BACK BY AND VERIFIED WITH: [place] [id number] FCP SUSCEPTIBILITIES TO FOLLOW Performed at [facility], [place] [address], [place], [place] [address]    Report Status PENDING  Incomplete  Blood Culture ID Panel (Reflexed)     Status: Abnormal   Collection Time: 10/18/21  6:24 PM  Result Value Ref Range Status   Enterococcus faecalis NOT DETECTED NOT DETECTED Final   Enterococcus Faecium NOT DETECTED NOT DETECTED Final   Listeria monocytogenes NOT DETECTED NOT DETECTED Final   Staphylococcus species NOT DETECTED NOT DETECTED Final   Staphylococcus aureus (BCID) NOT DETECTED NOT DETECTED Final   Staphylococcus epidermidis NOT DETECTED NOT DETECTED Final   Staphylococcus lugdunensis NOT DETECTED NOT DETECTED Final   Streptococcus species DETECTED (A) NOT DETECTED Final    Comment: Not Enterococcus species, Streptococcus agalactiae, Streptococcus pyogenes, or Streptococcus pneumoniae. CRITICAL RESULT CALLED TO, READ BACK BY AND VERIFIED WITH: NATHAN BEULE 10/19/21 0602 MW    Streptococcus agalactiae NOT DETECTED NOT DETECTED Final   Streptococcus pneumoniae NOT DETECTED NOT DETECTED Final   Streptococcus pyogenes NOT DETECTED NOT DETECTED Final   A.calcoaceticus-baumannii NOT DETECTED NOT DETECTED Final   Bacteroides fragilis NOT DETECTED NOT DETECTED Final   Enterobacterales NOT DETECTED NOT DETECTED Final   Enterobacter cloacae complex NOT DETECTED NOT DETECTED Final    Escherichia coli NOT DETECTED NOT DETECTED Final   Klebsiella aerogenes NOT DETECTED NOT DETECTED Final   Klebsiella oxytoca NOT DETECTED NOT DETECTED Final   Klebsiella pneumoniae NOT DETECTED NOT DETECTED Final   Proteus species NOT DETECTED NOT DETECTED Final   Salmonella species NOT DETECTED NOT DETECTED Final   Serratia marcescens NOT DETECTED NOT DETECTED Final   Haemophilus influenzae NOT DETECTED NOT DETECTED Final   Neisseria meningitidis NOT DETECTED NOT DETECTED Final   Pseudomonas aeruginosa NOT DETECTED NOT DETECTED Final   Stenotrophomonas maltophilia NOT DETECTED NOT DETECTED Final   Candida albicans NOT DETECTED NOT DETECTED Final   Candida auris NOT DETECTED NOT DETECTED Final   Candida glabrata NOT DETECTED NOT DETECTED Final   Candida krusei NOT DETECTED NOT DETECTED Final   Candida parapsilosis NOT DETECTED NOT DETECTED Final   Candida tropicalis NOT DETECTED NOT DETECTED Final   Cryptococcus neoformans/gattii NOT DETECTED NOT DETECTED Final    Comment: Performed at [facility], [address] [place], [place] [address]  Resp Panel by RT-PCR (Flu A&B, Covid) Nasopharyngeal Swab     Status: None   Collection Time: 10/18/21  8:16 PM   Specimen: Nasopharyngeal Swab; Nasopharyngeal(NP) swabs in vial transport medium  Result Value Ref Range Status   SARS Coronavirus 2 by RT PCR NEGATIVE NEGATIVE Final    Comment: (NOTE) SARS-CoV-2 target nucleic acids are NOT DETECTED.  The SARS-CoV-2 RNA is generally detectable in upper respiratory specimens during the acute phase of infection. The lowest concentration of SARS-CoV-2 viral copies this assay can detect is 138 copies/mL. A negative result does not preclude SARS-Cov-2 infection  and should not be used as the sole basis for treatment or other patient management decisions. A negative result may occur with  improper specimen collection/handling, submission of specimen other than nasopharyngeal swab, presence  of viral mutation(s) within the areas targeted by this assay, and inadequate number of viral copies(<138 copies/mL). A negative result must be combined with clinical observations, patient history, and epidemiological information. The expected result is Negative.  Fact Sheet for Patients:  EntrepreneurPulse.com.au  Fact Sheet for Healthcare Providers:  IncredibleEmployment.be  This test is no t yet approved or cleared by the [place] FDA and  has been authorized for detection and/or diagnosis of SARS-CoV-2 by FDA under an Emergency Use Authorization (EUA). This EUA will remain  in effect (meaning this test can be used) for the duration of the COVID-19 declaration under Section 564(b)(1) of the Act, 21 U.S.C.section 360bbb-3(b)(1), unless the authorization is terminated  or revoked sooner.       Influenza A by PCR NEGATIVE NEGATIVE Final   Influenza B by PCR NEGATIVE NEGATIVE Final    Comment: (NOTE) The Xpert Xpress SARS-CoV-2/FLU/RSV plus assay is intended as an aid in the diagnosis of influenza from Nasopharyngeal swab specimens and should not be used as a sole basis for treatment. Nasal washings and aspirates are unacceptable for Xpert Xpress SARS-CoV-2/FLU/RSV testing.  Fact Sheet for Patients: EntrepreneurPulse.com.au  Fact Sheet for Healthcare Providers: IncredibleEmployment.be  This test is not yet approved or cleared by the [place] FDA and has been authorized for detection and/or diagnosis of SARS-CoV-2 by FDA under an Emergency Use Authorization (EUA). This EUA will remain in effect (meaning this test can be used) for the duration of the COVID-19 declaration under Section 564(b)(1) of the Act, 21 U.S.C. section 360bbb-3(b)(1), unless the authorization is terminated or revoked.  Performed at [facility], [address] [place], [place] [address]   Culture, blood (Routine  X 2) w Reflex to ID Panel     Status: None   Collection Time: 10/18/21 11:28 PM   Specimen: BLOOD  Result Value Ref Range Status   Specimen Description BLOOD LEFT HAND  Final   Special Requests   Final    BOTTLES DRAWN AEROBIC ONLY Blood Culture adequate volume   Culture   Final    NO GROWTH 5 DAYS Performed at [facility], [address], [place], [place] [address]    Report Status 10/23/2021 FINAL  Final  MRSA Next Gen by PCR, Nasal     Status: None   Collection Time: 10/19/21  5:04 AM   Specimen: Nasal Mucosa; Nasal Swab  Result Value Ref Range Status   MRSA by PCR Next Gen NOT DETECTED NOT DETECTED Final    Comment: (NOTE) The GeneXpert MRSA Assay (FDA approved for NASAL specimens only), is one component of a comprehensive MRSA colonization surveillance program. It is not intended to diagnose MRSA infection nor to guide or monitor treatment for MRSA infections. Test performance is not FDA approved in patients less than 35 years old. Performed at [facility], [name] [place],  [address]   CULTURE, BLOOD (ROUTINE X 2) w Reflex to ID Panel     Status: None (Preliminary result)   Collection Time: 10/20/21  6:08 PM   Specimen: BLOOD  Result Value Ref Range Status   Specimen Description BLOOD LFOA  Final   Special Requests BOTTLES DRAWN AEROBIC AND ANAEROBIC BCAV  Final   Culture   Final    NO GROWTH  3 DAYS Performed at [facility], [address] [place], [place] [address]    Report Status PENDING  Incomplete  CULTURE, BLOOD (ROUTINE X 2) w Reflex to ID Panel     Status: None (Preliminary result)   Collection Time: 10/20/21  6:23 PM   Specimen: BLOOD  Result Value Ref Range Status   Specimen Description BLOOD [facility]  Final   Special Requests BOTTLES DRAWN AEROBIC ONLY BCLV  Final   Culture   Final    NO GROWTH 3 DAYS Performed at [facility], [address], [place], [place] [address]    Report Status PENDING   Incomplete       Radiology Studies: CT HUMERUS RIGHT WO CONTRAST  Result Date: 10/22/2021 CLINICAL DATA:  Chronic alcohol abuse.  Shoulder trauma. EXAM: CT OF THE RIGHT HUMERUS WITHOUT CONTRAST CT OF THE RIGHT FOREARM WITHOUT CONTRAST TECHNIQUE: Multidetector CT imaging was performed of the right humerus according to the standard protocol. Multiplanar CT image reconstructions were also generated. Multidetector CT imaging was performed of the right forearm according to the standard protocol. Multiplanar CT image reconstructions were also generated. RADIATION DOSE REDUCTION: This exam was performed according to the departmental dose-optimization program which includes automated exposure control, adjustment of the mA and/or kV according to patient size and/or use of iterative reconstruction technique. COMPARISON:  None. FINDINGS: Bones/Joint/Cartilage Generalized osteopenia. Severely comminuted and displaced fracture of the surgical neck of right proximal humerus. 4 cm of lateral displacement, 2 cm of superior displacement. Numerous fracture fragments are located along the anterior humeral head. Severely comminuted fracture of the greater tuberosity and lesser tuberosity. Fracture cleft extends to the superior articular surface. No glenohumeral dislocation. Mild arthropathy of the acromioclavicular joint. No acute fracture or dislocation of the radius and ulna. No elbow fracture or dislocation. 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 Ligaments Ligaments are suboptimally evaluated by CT. Muscles and Tendons Focal area of atrophy in the biceps muscle likely reflecting prior injury. Soft tissue edema in the biceps muscle concerning for muscle strain. Soft tissue No fluid collection or hematoma. No soft tissue mass. Severe soft tissue edema throughout the upper arm and forearm with skin thickening which may reflect reactive edema versus cellulitis. No drainable fluid collection.  IMPRESSION: 1. Severely comminuted and displaced fracture of the surgical neck of right proximal humerus. 4 cm of lateral displacement, 2 cm of superior displacement. Numerous fracture fragments are located along the anterior humeral head. Severely comminuted fracture of the greater tuberosity and lesser tuberosity. Fracture cleft extends to the superior articular surface. 2.  No acute osseous injury of the right forearm. 3. 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 Electronically Signed   By: Kathreen Devoid M.D.   On: 10/22/2021 11:57   CT FOREARM RIGHT WO CONTRAST  Result Date: 10/22/2021 CLINICAL DATA:  Chronic alcohol abuse.  Shoulder trauma. EXAM: CT OF THE RIGHT HUMERUS WITHOUT CONTRAST CT OF THE RIGHT FOREARM WITHOUT CONTRAST TECHNIQUE: Multidetector CT imaging was performed of the right humerus according to the standard protocol. Multiplanar CT image reconstructions were also generated. Multidetector CT imaging was performed of the right forearm according to the standard protocol. Multiplanar CT image reconstructions were also generated. RADIATION DOSE REDUCTION: This exam was performed according to the departmental dose-optimization program which includes automated exposure control, adjustment of the mA and/or kV according to patient size and/or use of iterative reconstruction technique. COMPARISON:  None. FINDINGS: Bones/Joint/Cartilage Generalized osteopenia. Severely comminuted and displaced  fracture of the surgical neck of right proximal humerus. 4 cm of lateral displacement, 2 cm of superior displacement. Numerous fracture fragments are located along the anterior humeral head. Severely comminuted fracture of the greater tuberosity and lesser tuberosity. Fracture cleft extends to the superior articular surface. No glenohumeral dislocation. Mild arthropathy of the acromioclavicular joint. No acute fracture or dislocation of the radius and ulna. No elbow fracture or  dislocation. 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 Ligaments Ligaments are suboptimally evaluated by CT. Muscles and Tendons Focal area of atrophy in the biceps muscle likely reflecting prior injury. Soft tissue edema in the biceps muscle concerning for muscle strain. Soft tissue No fluid collection or hematoma. No soft tissue mass. Severe soft tissue edema throughout the upper arm and forearm with skin thickening which may reflect reactive edema versus cellulitis. No drainable fluid collection. IMPRESSION: 1. Severely comminuted and displaced fracture of the surgical neck of right proximal humerus. 4 cm of lateral displacement, 2 cm of superior displacement. Numerous fracture fragments are located along the anterior humeral head. Severely comminuted fracture of the greater tuberosity and lesser tuberosity. Fracture cleft extends to the superior articular surface. 2.  No acute osseous injury of the right forearm. 3. 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 Electronically Signed   By: Kathreen Devoid M.D.   On: 10/22/2021 11:57      Scheduled Meds:  Chlorhexidine Gluconate Cloth  6 each Topical O0600   folic acid  1 mg Oral Daily   insulin aspart  0-15 Units Subcutaneous TID WC   multivitamin with minerals  1 tablet Oral Daily   nicotine  14 mg Transdermal Daily   pantoprazole  40 mg Oral QHS   spiritus frumenti  1 each Oral BID AC   thiamine  100 mg Oral Daily   Or   thiamine  100 mg Intravenous Daily   Continuous Infusions:  sodium chloride Stopped (10/19/21 0149)   sodium chloride     cefTRIAXone (ROCEPHIN)  IV 2 g (10/22/21 2351)     LOS: 5 days    Dessa Phi, DO [facility] 10/23/2021, 9:39 AM   Available via Epic secure chat 7am-7pm After these hours, please refer to coverage provider listed on [email or web address]

## 2021-10-24 LAB — BASIC METABOLIC PANEL
Anion gap: 11 (ref 5–15)
BUN: 17 mg/dL (ref 8–23)
CO2: 23 mmol/L (ref 22–32)
Calcium: 8.6 mg/dL — ABNORMAL LOW (ref 8.9–10.3)
Chloride: 99 mmol/L (ref 98–111)
Creatinine, Ser: 1.67 mg/dL — ABNORMAL HIGH (ref 0.61–1.24)
GFR, Estimated: 44 mL/min — ABNORMAL LOW (ref >60)
Glucose, Bld: 120 mg/dL — ABNORMAL HIGH (ref 70–99)
Potassium: 3.7 mmol/L (ref 3.5–5.1)
Sodium: 133 mmol/L — ABNORMAL LOW (ref 135–145)

## 2021-10-24 LAB — GLUCOSE, CAPILLARY
Glucose-Capillary: 94 mg/dL (ref 70–99)
Glucose-Capillary: 134 mg/dL — ABNORMAL HIGH (ref 70–99)
Glucose-Capillary: 93 mg/dL (ref 70–99)

## 2021-10-24 LAB — CULTURE, BLOOD (ROUTINE X 2)

## 2021-10-24 LAB — CBC
HCT: 28.8 % — ABNORMAL LOW (ref 39.0–52.0)
Hemoglobin: 9.9 g/dL — ABNORMAL LOW (ref 13.0–17.0)
MCH: 33.1 pg (ref 26.0–34.0)
MCHC: 34.4 g/dL (ref 30.0–36.0)
MCV: 96.3 fL (ref 80.0–100.0)
Platelets: 360 10*3/uL (ref 150–400)
RBC: 2.99 MIL/uL — ABNORMAL LOW (ref 4.22–5.81)
RDW: 18 % — ABNORMAL HIGH (ref 11.5–15.5)
WBC: 19.1 10*3/uL — ABNORMAL HIGH (ref 4.0–10.5)
nRBC: 0.9 % — ABNORMAL HIGH (ref 0.0–0.2)

## 2021-10-24 NOTE — Plan of Care (Signed)

## 2021-10-24 NOTE — Progress Notes (Signed)
[facility]  ROUNDING NOTE   Subjective:   Patient is asking when his surgery for his shoulder will be.   UOP 1579mL Creatinine 1.64 Na 133  Objective:  Vital signs in last 24 hours:  Temp:  [97.5 F (36.4 C)-98.4 F (36.9 C)] 97.5 F (36.4 C) (01/15 0808) Pulse Rate:  [95-106] 95 (01/15 0808) Resp:  [18] 18 (01/15 0808) BP: (125-136)/(64-88) 125/78 (01/15 0808) SpO2:  [91 %-94 %] 94 % (01/15 0808)  Weight change:  Filed Weights   10/20/21 0500 10/21/21 0500 10/22/21 0500  Weight: 72.5 kg 66.3 kg 66.2 kg    Intake/Output: I/O last 3 completed shifts: In: 660 [P.O.:660] Out: 2100 [Urine:2100]   Intake/Output this shift:  Total I/O In: 180 [P.O.:180] Out: -   Physical Exam: General: NAD, laying in bed  Head: Normocephalic, atraumatic. Moist oral mucosal membranes  Eyes: Anicteric  Lungs:  Clear to auscultation, normal effort, 4L [place] O2  Heart: Regular rate and rhythm  Abdomen:  Soft, nontender  Extremities: no peripheral edema.  Neurologic: Nonfocal, moving all four extremities  Skin: RUE ecchymosis       Basic Metabolic Panel: Recent Labs  Lab 10/18/21 1800 10/18/21 2016 10/19/21 0504 10/19/21 1402 10/20/21 0650 10/21/21 0534 10/22/21 0444 10/23/21 0516 10/24/21 0456  NA 134* 134* 130*   < > 138 132* 134* 132* 133*  K 3.5 3.5 2.8*   < > 3.0* 3.2* 4.1 3.7 3.7  CL 91* 100 92*   < > 96* 94* 100 96* 99  CO2 9* 10* 23   < > 32 29 26 25 23   GLUCOSE 170* 192* 214*   < > 142* 161* 129* 115* 120*  BUN 64* 59* 59*   < > 35* 27* 23 19 17   CREATININE 4.30* 3.68* 3.32*   < > 2.18* 1.93* 1.91* 1.65* 1.67*  CALCIUM 8.1* 6.7* 7.5*   < > 8.0* 7.9* 8.4* 8.3* 8.6*  MG 2.6*  --  1.7  --   --   --  1.6* 2.0  --   PHOS  --  7.6* 4.9*  --   --   --   --   --   --    < > = values in this interval not displayed.     Liver Function Tests: Recent Labs  Lab 10/18/21 1800 10/19/21 0504 10/20/21 0650  AST 37 46* 42*  ALT 19 21 23   ALKPHOS 100 83 73   BILITOT 1.0 1.0 1.1  PROT 6.2* 5.3* 5.1*  ALBUMIN 3.4* 3.0* 2.7*    Recent Labs  Lab 10/18/21 1800 10/19/21 0504  LIPASE 57* 112*    No results for input(s): AMMONIA in the last 168 hours.  CBC: Recent Labs  Lab 10/18/21 1800 10/19/21 0504 10/20/21 0650 10/21/21 0534 10/22/21 0444 10/23/21 0516 10/24/21 0456  WBC 24.3*   < > 16.6* 18.6* 22.8* 19.5* 19.1*  NEUTROABS 20.4*  --   --   --   --   --   --   HGB 11.3*   < > 7.6* 7.2* 10.0* 10.0* 9.9*  HCT 34.4*   < > 22.0* 21.6* 29.3* 29.5* 28.8*  MCV 104.9*   < > 97.3 100.0 96.7 97.0 96.3  PLT 275   < > 180 200 266 290 360   < > = values in this interval not displayed.     Cardiac Enzymes: Recent Labs  Lab 10/18/21 1800 10/19/21 0504 10/20/21 0650 10/21/21 0534 10/22/21 [place]  990* 2,341* 1,503* 861* 581*     BNP: Invalid input(s): POCBNP  CBG: Recent Labs  Lab 10/22/21 1628 10/23/21 0859 10/23/21 1044 10/23/21 1711 10/24/21 0754  GLUCAP 136* 106* 120* 103* 53     Microbiology: Results for orders placed or performed during the hospital encounter of 10/18/21  Blood culture (routine x 2)     Status: Abnormal (Preliminary result)   Collection Time: 10/18/21  6:24 PM   Specimen: BLOOD  Result Value Ref Range Status   Specimen Description   Final    BLOOD FOOT Performed at [facility], [address], [place], [place] [address]    Special Requests   Final    BOTTLES DRAWN AEROBIC AND ANAEROBIC BCAV Performed at [facility], [address] [place], [place] [address]    Culture  Setup Time   Final    Organism ID to follow IN BOTH AEROBIC AND ANAEROBIC BOTTLES GRAM POSITIVE COCCI CRITICAL RESULT CALLED TO, READ BACK BY AND VERIFIED WITH: NATHAN BEULE 10/19/21 0602 MW    Culture (A)  Final    STREPTOCOCCUS GALLOLYTICUS STAPHYLOCOCCUS EPIDERMIDIS THE SIGNIFICANCE OF ISOLATING THIS ORGANISM FROM A SINGLE SET OF BLOOD CULTURES WHEN MULTIPLE SETS ARE DRAWN IS UNCERTAIN.  PLEASE NOTIFY THE MICROBIOLOGY DEPARTMENT WITHIN ONE WEEK IF SPECIATION AND SENSITIVITIES ARE REQUIRED. CRITICAL RESULT CALLED TO, READ BACK BY AND VERIFIED WITH: [place] [id number] FCP SUSCEPTIBILITIES TO FOLLOW Performed at [facility], [address], [place], [place] [address]    Report Status PENDING  Incomplete  Blood Culture ID Panel (Reflexed)     Status: Abnormal   Collection Time: 10/18/21  6:24 PM  Result Value Ref Range Status   Enterococcus faecalis NOT DETECTED NOT DETECTED Final   Enterococcus Faecium NOT DETECTED NOT DETECTED Final   Listeria monocytogenes NOT DETECTED NOT DETECTED Final   Staphylococcus species NOT DETECTED NOT DETECTED Final   Staphylococcus aureus (BCID) NOT DETECTED NOT DETECTED Final   Staphylococcus epidermidis NOT DETECTED NOT DETECTED Final   Staphylococcus lugdunensis NOT DETECTED NOT DETECTED Final   Streptococcus species DETECTED (A) NOT DETECTED Final    Comment: Not Enterococcus species, Streptococcus agalactiae, Streptococcus pyogenes, or Streptococcus pneumoniae. CRITICAL RESULT CALLED TO, READ BACK BY AND VERIFIED WITH: NATHAN BEULE 10/19/21 0602 MW    Streptococcus agalactiae NOT DETECTED NOT DETECTED Final   Streptococcus pneumoniae NOT DETECTED NOT DETECTED Final   Streptococcus pyogenes NOT DETECTED NOT DETECTED Final   A.calcoaceticus-baumannii NOT DETECTED NOT DETECTED Final   Bacteroides fragilis NOT DETECTED NOT DETECTED Final   Enterobacterales NOT DETECTED NOT DETECTED Final   Enterobacter cloacae complex NOT DETECTED NOT DETECTED Final   Escherichia coli NOT DETECTED NOT DETECTED Final   Klebsiella aerogenes NOT DETECTED NOT DETECTED Final   Klebsiella oxytoca NOT DETECTED NOT DETECTED Final   Klebsiella pneumoniae NOT DETECTED NOT DETECTED Final   Proteus species NOT DETECTED NOT DETECTED Final   Salmonella species NOT DETECTED NOT DETECTED Final   Serratia marcescens NOT DETECTED NOT DETECTED Final    Haemophilus influenzae NOT DETECTED NOT DETECTED Final   Neisseria meningitidis NOT DETECTED NOT DETECTED Final   Pseudomonas aeruginosa NOT DETECTED NOT DETECTED Final   Stenotrophomonas maltophilia NOT DETECTED NOT DETECTED Final   Candida albicans NOT DETECTED NOT DETECTED Final   Candida auris NOT DETECTED NOT DETECTED Final   Candida glabrata NOT DETECTED NOT DETECTED Final   Candida krusei NOT DETECTED NOT DETECTED Final   Candida parapsilosis NOT  DETECTED NOT DETECTED Final   Candida tropicalis NOT DETECTED NOT DETECTED Final   Cryptococcus neoformans/gattii NOT DETECTED NOT DETECTED Final    Comment: Performed at [facility], [address] [place], [place] [address]  Resp Panel by RT-PCR (Flu A&B, Covid) Nasopharyngeal Swab     Status: None   Collection Time: 10/18/21  8:16 PM   Specimen: Nasopharyngeal Swab; Nasopharyngeal(NP) swabs in vial transport medium  Result Value Ref Range Status   SARS Coronavirus 2 by RT PCR NEGATIVE NEGATIVE Final    Comment: (NOTE) SARS-CoV-2 target nucleic acids are NOT DETECTED.  The SARS-CoV-2 RNA is generally detectable in upper respiratory specimens during the acute phase of infection. The lowest concentration of SARS-CoV-2 viral copies this assay can detect is 138 copies/mL. A negative result does not preclude SARS-Cov-2 infection and should not be used as the sole basis for treatment or other patient management decisions. A negative result may occur with  improper specimen collection/handling, submission of specimen other than nasopharyngeal swab, presence of viral mutation(s) within the areas targeted by this assay, and inadequate number of viral copies(<138 copies/mL). A negative result must be combined with clinical observations, patient history, and epidemiological information. The expected result is Negative.  Fact Sheet for Patients:  EntrepreneurPulse.com.au  Fact Sheet for Healthcare Providers:   IncredibleEmployment.be  This test is no t yet approved or cleared by the [place] FDA and  has been authorized for detection and/or diagnosis of SARS-CoV-2 by FDA under an Emergency Use Authorization (EUA). This EUA will remain  in effect (meaning this test can be used) for the duration of the COVID-19 declaration under Section 564(b)(1) of the Act, 21 U.S.C.section 360bbb-3(b)(1), unless the authorization is terminated  or revoked sooner.       Influenza A by PCR NEGATIVE NEGATIVE Final   Influenza B by PCR NEGATIVE NEGATIVE Final    Comment: (NOTE) The Xpert Xpress SARS-CoV-2/FLU/RSV plus assay is intended as an aid in the diagnosis of influenza from Nasopharyngeal swab specimens and should not be used as a sole basis for treatment. Nasal washings and aspirates are unacceptable for Xpert Xpress SARS-CoV-2/FLU/RSV testing.  Fact Sheet for Patients: EntrepreneurPulse.com.au  Fact Sheet for Healthcare Providers: IncredibleEmployment.be  This test is not yet approved or cleared by the [place] FDA and has been authorized for detection and/or diagnosis of SARS-CoV-2 by FDA under an Emergency Use Authorization (EUA). This EUA will remain in effect (meaning this test can be used) for the duration of the COVID-19 declaration under Section 564(b)(1) of the Act, 21 U.S.C. section 360bbb-3(b)(1), unless the authorization is terminated or revoked.  Performed at [facility], [address] [place], [place] [address]   Culture, blood (Routine X 2) w Reflex to ID Panel     Status: None   Collection Time: 10/18/21 11:28 PM   Specimen: BLOOD  Result Value Ref Range Status   Specimen Description BLOOD LEFT HAND  Final   Special Requests   Final    BOTTLES DRAWN AEROBIC ONLY Blood Culture adequate volume   Culture   Final    NO GROWTH 5 DAYS Performed at [facility], [address],  [place],  [address]    Report Status 10/23/2021 FINAL  Final  MRSA Next Gen by PCR, Nasal     Status: None   Collection Time: 10/19/21  5:04 AM   Specimen: Nasal Mucosa; Nasal Swab  Result Value Ref Range Status   MRSA by PCR Next Gen  NOT DETECTED NOT DETECTED Final    Comment: (NOTE) The GeneXpert MRSA Assay (FDA approved for NASAL specimens only), is one component of a comprehensive MRSA colonization surveillance program. It is not intended to diagnose MRSA infection nor to guide or monitor treatment for MRSA infections. Test performance is not FDA approved in patients less than 68 years old. Performed at [facility], [address] [place], [place] [address]   CULTURE, BLOOD (ROUTINE X 2) w Reflex to ID Panel     Status: None (Preliminary result)   Collection Time: 10/20/21  6:08 PM   Specimen: BLOOD  Result Value Ref Range Status   Specimen Description BLOOD LFOA  Final   Special Requests BOTTLES DRAWN AEROBIC AND ANAEROBIC BCAV  Final   Culture   Final    NO GROWTH 4 DAYS Performed at [facility], [address], [place], [place] [address]    Report Status PENDING  Incomplete  CULTURE, BLOOD (ROUTINE X 2) w Reflex to ID Panel     Status: None (Preliminary result)   Collection Time: 10/20/21  6:23 PM   Specimen: BLOOD  Result Value Ref Range Status   Specimen Description BLOOD [facility]  Final   Special Requests BOTTLES DRAWN AEROBIC ONLY BCLV  Final   Culture   Final    NO GROWTH 4 DAYS Performed at [facility], [address] [place], [place] [address]    Report Status PENDING  Incomplete    Coagulation Studies: No results for input(s): LABPROT, INR in the last 72 hours.  Urinalysis: No results for input(s): COLORURINE, LABSPEC, PHURINE, GLUCOSEU, HGBUR, BILIRUBINUR, KETONESUR, PROTEINUR, UROBILINOGEN, NITRITE, LEUKOCYTESUR in the last 72 hours.  Invalid input(s): APPERANCEUR     Imaging: CT HUMERUS RIGHT WO CONTRAST  Result  Date: 10/22/2021 CLINICAL DATA:  Chronic alcohol abuse.  Shoulder trauma. EXAM: CT OF THE RIGHT HUMERUS WITHOUT CONTRAST CT OF THE RIGHT FOREARM WITHOUT CONTRAST TECHNIQUE: Multidetector CT imaging was performed of the right humerus according to the standard protocol. Multiplanar CT image reconstructions were also generated. Multidetector CT imaging was performed of the right forearm according to the standard protocol. Multiplanar CT image reconstructions were also generated. RADIATION DOSE REDUCTION: This exam was performed according to the departmental dose-optimization program which includes automated exposure control, adjustment of the mA and/or kV according to patient size and/or use of iterative reconstruction technique. COMPARISON:  None. FINDINGS: Bones/Joint/Cartilage Generalized osteopenia. Severely comminuted and displaced fracture of the surgical neck of right proximal humerus. 4 cm of lateral displacement, 2 cm of superior displacement. Numerous fracture fragments are located along the anterior humeral head. Severely comminuted fracture of the greater tuberosity and lesser tuberosity. Fracture cleft extends to the superior articular surface. No glenohumeral dislocation. Mild arthropathy of the acromioclavicular joint. No acute fracture or dislocation of the radius and ulna. No elbow fracture or dislocation. 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 Ligaments Ligaments are suboptimally evaluated by CT. Muscles and Tendons Focal area of atrophy in the biceps muscle likely reflecting prior injury. Soft tissue edema in the biceps muscle concerning for muscle strain. Soft tissue No fluid collection or hematoma. No soft tissue mass. Severe soft tissue edema throughout the upper arm and forearm with skin thickening which may reflect reactive edema versus cellulitis. No drainable fluid collection. IMPRESSION: 1. Severely comminuted and displaced fracture of the surgical neck of  right proximal humerus. 4 cm of lateral displacement, 2 cm of superior displacement. Numerous fracture fragments are located  along the anterior humeral head. Severely comminuted fracture of the greater tuberosity and lesser tuberosity. Fracture cleft extends to the superior articular surface. 2.  No acute osseous injury of the right forearm. 3. 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 Electronically Signed   By: Kathreen Devoid M.D.   On: 10/22/2021 11:57   CT FOREARM RIGHT WO CONTRAST  Result Date: 10/22/2021 CLINICAL DATA:  Chronic alcohol abuse.  Shoulder trauma. EXAM: CT OF THE RIGHT HUMERUS WITHOUT CONTRAST CT OF THE RIGHT FOREARM WITHOUT CONTRAST TECHNIQUE: Multidetector CT imaging was performed of the right humerus according to the standard protocol. Multiplanar CT image reconstructions were also generated. Multidetector CT imaging was performed of the right forearm according to the standard protocol. Multiplanar CT image reconstructions were also generated. RADIATION DOSE REDUCTION: This exam was performed according to the departmental dose-optimization program which includes automated exposure control, adjustment of the mA and/or kV according to patient size and/or use of iterative reconstruction technique. COMPARISON:  None. FINDINGS: Bones/Joint/Cartilage Generalized osteopenia. Severely comminuted and displaced fracture of the surgical neck of right proximal humerus. 4 cm of lateral displacement, 2 cm of superior displacement. Numerous fracture fragments are located along the anterior humeral head. Severely comminuted fracture of the greater tuberosity and lesser tuberosity. Fracture cleft extends to the superior articular surface. No glenohumeral dislocation. Mild arthropathy of the acromioclavicular joint. No acute fracture or dislocation of the radius and ulna. No elbow fracture or dislocation. 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  Ligaments Ligaments are suboptimally evaluated by CT. Muscles and Tendons Focal area of atrophy in the biceps muscle likely reflecting prior injury. Soft tissue edema in the biceps muscle concerning for muscle strain. Soft tissue No fluid collection or hematoma. No soft tissue mass. Severe soft tissue edema throughout the upper arm and forearm with skin thickening which may reflect reactive edema versus cellulitis. No drainable fluid collection. IMPRESSION: 1. Severely comminuted and displaced fracture of the surgical neck of right proximal humerus. 4 cm of lateral displacement, 2 cm of superior displacement. Numerous fracture fragments are located along the anterior humeral head. Severely comminuted fracture of the greater tuberosity and lesser tuberosity. Fracture cleft extends to the superior articular surface. 2.  No acute osseous injury of the right forearm. 3. 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 Electronically Signed   By: Kathreen Devoid M.D.   On: 10/22/2021 11:57     Medications:    sodium chloride Stopped (10/19/21 0149)   sodium chloride     cefTRIAXone (ROCEPHIN)  IV 2 g (10/23/21 2153)    Chlorhexidine Gluconate Cloth  6 each Topical V8938   folic acid  1 mg Oral Daily   insulin aspart  0-15 Units Subcutaneous TID WC   multivitamin with minerals  1 tablet Oral Daily   nicotine  14 mg Transdermal Daily   pantoprazole  40 mg Oral QHS   spiritus frumenti  1 each Oral BID AC   thiamine  100 mg Oral Daily   Or   thiamine  100 mg Intravenous Daily   sodium chloride, albuterol, docusate sodium, HYDROmorphone, polyethylene glycol, traMADol  Assessment/ Plan:  Edward Singleton is a 71 y.o. white male with alcohol abuse, diabetes mellitus type II, hypertension, gout, dementia, gout, hyperlipidemia who is admitted to [facility] on 10/18/2021 for Rhabdomyolysis [M62.82]  Acute Kidney Injury on chronic kidney disease stage IV with baseline creatinine 2.58 and  GFR of 26 on 05/13/21.  Patient  currently followed by nephrology at [facility]. Acute kidney injury secondary to rhabdomyolysis.  - Off IV fluids. Encourage PO intake - no acute indication for dialysis  Rhabdomyolysis: CK level has trended down.  Anemia with chronic kidney disease: status post PRBC transfusion on 1/12. Hemoglobin 9.9. Macrocytic on admission.   Diabetes mellitus type II with chronic kidney disease: noninsulin dependent. Hemoglobin A1c of 6.2%. Holding metformin.   Hypertension with chronic kidney disease: 125/78. Holding home regimen of amlodipine and metoprolol.   Gout: no acute gout flare. Currently off allopurinol.    LOS: 6 Edward Singleton 1/15/202310:46 AM

## 2021-10-24 NOTE — Progress Notes (Signed)
PROGRESS NOTE    Edward Singleton  QQV:[id number] DOB: 05/05/1951 DOA: 10/18/2021 PCP: Windy Fast, MD     Brief Narrative:  Edward Singleton is a 71 year old male with long history of chronic alcohol use was reportedly found down in his house.  He was last seen well several days ago.  The patient bedside is alert and responsive but confused as to the timetable of the last few days.  He reports that he had been drinking too much alcohol and fell.  He believes he fell on Sunday but is unclear as to what happened or the timing of the event.  Police were called to the home and had to break down the door finding him on the floor with significant bruising lying on his right arm.  Per ED/EMS report the patient was confused, hypothermic and hypotensive receiving IV fluids.  Per Tamela Oddi, who is healthcare power of attorney, the patient has been binge drinking since the summer.  Upon arrival in the ER patient complaining of cold and thirst, became nauseous with some vomiting episodes.  He received warmed IV fluid, empiric antibiotics.  On-call orthopedist was consulted with additional imaging ordered and instructions to place right arm in a sling.  After aggressive IV fluid resuscitation patient requiring vasopressor support.  He was weaned off levophed on 1/10 and transferred to [facility] service 1/11.   New events last 24 hours / Subjective: He wants to go home, complaining of right upper extremity pain. We discussed his sister's concern that he could care for himself at home alone. Patient is right handed and currently unable to use due to fracture.   Assessment & Plan:   Principal Problem:   Rhabdomyolysis Active Problems:   AKI (acute kidney injury) ([place])   Metabolic acidosis   Shock ([place])   Septic and hypovolemic shock Sepsis secondary to strep bacteremia  -Now off levophed  -Blood culture showing strep gallolyticus, starph epidermidis 1/9 -Repeat blood culture negative 1/11 -Infectious  disease following -Remains on ceftriaxone -CT right upper extremity completed which revealed reactive edema versus cellulitis, no drainable fluid collection. -Patient remains afebrile, improving today   Rhabdomyolysis -CK improved  Closed comminuted right proximal humerus fracture -Orthopedic surgery consulted, initially had planned on transferring to [facility] for surgical fixation, but now recommendation is for outpatient follow-up with elective repair -Remains in right shoulder sling for now -PT recommending SNF placement -Sister would like to discuss surgery option with orthopedic surgery Monday   Alcohol abuse with risk for DTs -Continue to monitor withdrawal on CIWA protocol -Receiving alcohol TID --> deescalated to BID   AKI on CKD stage IV -Baseline creatinine 2.6 -Nephrology following -Resolved   Acute urinary retention -Foley catheter placed 1/13 -He will need voiding trial prior to discharge  Symptomatic anemia, acute blood loss anemia  -Likely some blood loss component into hematoma of the right upper extremity -Hemoglobin 11.3 >> 8.6 >> 7.6 >> 7.2  -Transfuse 1 unit packed red blood cell in setting of hypoxemia and anemia -Chest x-ray did not reveal pulmonary edema -Change subq hep to SCDs  Acute hypoxic respiratory failure -Remains on O2, wean as able   Tobacco abuse -Nicotine patch    DVT prophylaxis: SCD  Code Status: Full Family Communication: None at bedside   Disposition Plan:  Status is: Inpatient  Remains inpatient appropriate because: Remains on IV antibiotics, needs SNF placement     Antimicrobials:  Anti-infectives (From admission, onward)    Start  Dose/Rate Route Frequency Ordered Stop   10/20/21 2000  cefTRIAXone (ROCEPHIN) 2 g in sodium chloride 0.9 % 100 mL IVPB        2 g 200 mL/hr over 30 Minutes Intravenous Every 24 hours 10/20/21 0824     10/19/21 2000  ceFEPIme (MAXIPIME) 2 g in sodium chloride 0.9 % 100 mL  IVPB  Status:  Discontinued        2 g 200 mL/hr over 30 Minutes Intravenous Every 24 hours 10/19/21 1407 10/20/21 0824   10/19/21 0700  vancomycin (VANCOCIN) IVPB 1000 mg/200 mL premix  Status:  Discontinued        1,000 mg 200 mL/hr over 60 Minutes Intravenous  Once 10/19/21 0606 10/19/21 0609   10/19/21 0700  vancomycin (VANCOREADY) IVPB 750 mg/150 mL        750 mg 150 mL/hr over 60 Minutes Intravenous  Once 10/19/21 0609 10/19/21 0729   10/18/21 1815  ceFEPIme (MAXIPIME) 2 g in sodium chloride 0.9 % 100 mL IVPB        2 g 200 mL/hr over 30 Minutes Intravenous  Once 10/18/21 1812 10/18/21 2023   10/18/21 1815  metroNIDAZOLE (FLAGYL) IVPB 500 mg        500 mg 100 mL/hr over 60 Minutes Intravenous  Once 10/18/21 1812 10/18/21 2225   10/18/21 1815  vancomycin (VANCOCIN) IVPB 1000 mg/200 mL premix        1,000 mg 200 mL/hr over 60 Minutes Intravenous  Once 10/18/21 1812 10/18/21 2326        Objective: Vitals:   10/23/21 1551 10/23/21 1953 10/24/21 0520 10/24/21 0808  BP: 136/88 125/64 128/79 125/78  Pulse: (!) 106 100 97 95  Resp: 18   18  Temp: 97.7 F (36.5 C) 98.4 F (36.9 C) 97.7 F (36.5 C) (!) 97.5 F (36.4 C)  TempSrc:   Oral Oral  SpO2: 91% 92%  94%  Weight:      Height:        Intake/Output Summary (Last 24 hours) at 10/24/2021 0921 Last data filed at 10/24/2021 0544 Gross per 24 hour  Intake 660 ml  Output 1550 ml  Net -890 ml    Filed Weights   10/20/21 0500 10/21/21 0500 10/22/21 0500  Weight: 72.5 kg 66.3 kg 66.2 kg    Examination: General exam: Appears calm, uncomfortable  Respiratory system: Clear to auscultation. Respiratory effort normal. Cardiovascular system: S1 & S2 heard, RR. No pedal edema. Gastrointestinal system: Abdomen is nondistended, soft and nontender. Normal bowel sounds heard. Central nervous system: Alert and oriented. Non focal exam. Speech clear  Extremities: RUE with swelling and diffuse ecchymosis  Psychiatry: Mood & affect  appropriate.    Data Reviewed: I have personally reviewed following labs and imaging studies  CBC: Recent Labs  Lab 10/18/21 1800 10/19/21 0504 10/20/21 0650 10/21/21 0534 10/22/21 0444 10/23/21 0516 10/24/21 0456  WBC 24.3*   < > 16.6* 18.6* 22.8* 19.5* 19.1*  NEUTROABS 20.4*  --   --   --   --   --   --   HGB 11.3*   < > 7.6* 7.2* 10.0* 10.0* 9.9*  HCT 34.4*   < > 22.0* 21.6* 29.3* 29.5* 28.8*  MCV 104.9*   < > 97.3 100.0 96.7 97.0 96.3  PLT 275   < > 180 200 266 290 360   < > = values in this interval not displayed.    Basic Metabolic Panel: Recent Labs  Lab 10/18/21 1800 10/18/21 2016  10/19/21 0504 10/19/21 1402 10/20/21 0650 10/21/21 0534 10/22/21 0444 10/23/21 0516 10/24/21 0456  NA 134* 134* 130*   < > 138 132* 134* 132* 133*  K 3.5 3.5 2.8*   < > 3.0* 3.2* 4.1 3.7 3.7  CL 91* 100 92*   < > 96* 94* 100 96* 99  CO2 9* 10* 23   < > 32 29 26 25 23   GLUCOSE 170* 192* 214*   < > 142* 161* 129* 115* 120*  BUN 64* 59* 59*   < > 35* 27* 23 19 17   CREATININE 4.30* 3.68* 3.32*   < > 2.18* 1.93* 1.91* 1.65* 1.67*  CALCIUM 8.1* 6.7* 7.5*   < > 8.0* 7.9* 8.4* 8.3* 8.6*  MG 2.6*  --  1.7  --   --   --  1.6* 2.0  --   PHOS  --  7.6* 4.9*  --   --   --   --   --   --    < > = values in this interval not displayed.    GFR: Estimated Creatinine Clearance: 39.1 mL/min (A) (by C-G formula based on SCr of 1.67 mg/dL (H)). Liver Function Tests: Recent Labs  Lab 10/18/21 1800 10/19/21 0504 10/20/21 0650  AST 37 46* 42*  ALT 19 21 23   ALKPHOS 100 83 73  BILITOT 1.0 1.0 1.1  PROT 6.2* 5.3* 5.1*  ALBUMIN 3.4* 3.0* 2.7*    Recent Labs  Lab 10/18/21 1800 10/19/21 0504  LIPASE 57* 112*    No results for input(s): AMMONIA in the last 168 hours. Coagulation Profile: No results for input(s): INR, PROTIME in the last 168 hours. Cardiac Enzymes: Recent Labs  Lab 10/18/21 1800 10/19/21 0504 10/20/21 0650 10/21/21 0534 10/22/21 0444  CKTOTAL 990* 2,341* 1,503*  861* 581*    BNP (last 3 results) No results for input(s): PROBNP in the last 8760 hours. HbA1C: No results for input(s): HGBA1C in the last 72 hours.  CBG: Recent Labs  Lab 10/22/21 1628 10/23/21 0859 10/23/21 1044 10/23/21 1711 10/24/21 0754  GLUCAP 136* 106* 120* 103* 94    Lipid Profile: No results for input(s): CHOL, HDL, LDLCALC, TRIG, CHOLHDL, LDLDIRECT in the last 72 hours. Thyroid Function Tests: No results for input(s): TSH, T4TOTAL, FREET4, T3FREE, THYROIDAB in the last 72 hours.  Anemia Panel: No results for input(s): VITAMINB12, FOLATE, FERRITIN, TIBC, IRON, RETICCTPCT in the last 72 hours. Sepsis Labs: Recent Labs  Lab 10/18/21 1800 10/18/21 1824 10/18/21 1950 10/19/21 0125 10/19/21 0504 10/20/21 0650  PROCALCITON 1.25  --   --   --  1.34 0.89  LATICACIDVEN  --  8.2* 5.3* 2.1*  --   --      Recent Results (from the past 240 hour(s))  Blood culture (routine x 2)     Status: Abnormal (Preliminary result)   Collection Time: 10/18/21  6:24 PM   Specimen: BLOOD  Result Value Ref Range Status   Specimen Description   Final    BLOOD FOOT Performed at [facility], [address] [place], [place] [address]    Special Requests   Final    BOTTLES DRAWN AEROBIC AND ANAEROBIC BCAV Performed at [facility], [address], [place], [place] [address]    Culture  Setup Time   Final    Organism ID to follow IN BOTH AEROBIC AND ANAEROBIC BOTTLES GRAM POSITIVE COCCI CRITICAL RESULT CALLED TO, READ BACK BY AND VERIFIED WITH: NATHAN BEULE 10/19/21 0602 MW  Culture (A)  Final    STREPTOCOCCUS GALLOLYTICUS STAPHYLOCOCCUS EPIDERMIDIS THE SIGNIFICANCE OF ISOLATING THIS ORGANISM FROM A SINGLE SET OF BLOOD CULTURES WHEN MULTIPLE SETS ARE DRAWN IS UNCERTAIN. PLEASE NOTIFY THE MICROBIOLOGY DEPARTMENT WITHIN ONE WEEK IF SPECIATION AND SENSITIVITIES ARE REQUIRED. CRITICAL RESULT CALLED TO, READ BACK BY AND VERIFIED WITH: [place]  [id number] FCP SUSCEPTIBILITIES TO FOLLOW Performed at [facility], [place] [address] [place], [place] [address]    Report Status PENDING  Incomplete  Blood Culture ID Panel (Reflexed)     Status: Abnormal   Collection Time: 10/18/21  6:24 PM  Result Value Ref Range Status   Enterococcus faecalis NOT DETECTED NOT DETECTED Final   Enterococcus Faecium NOT DETECTED NOT DETECTED Final   Listeria monocytogenes NOT DETECTED NOT DETECTED Final   Staphylococcus species NOT DETECTED NOT DETECTED Final   Staphylococcus aureus (BCID) NOT DETECTED NOT DETECTED Final   Staphylococcus epidermidis NOT DETECTED NOT DETECTED Final   Staphylococcus lugdunensis NOT DETECTED NOT DETECTED Final   Streptococcus species DETECTED (A) NOT DETECTED Final    Comment: Not Enterococcus species, Streptococcus agalactiae, Streptococcus pyogenes, or Streptococcus pneumoniae. CRITICAL RESULT CALLED TO, READ BACK BY AND VERIFIED WITH: NATHAN BEULE 10/19/21 0602 MW    Streptococcus agalactiae NOT DETECTED NOT DETECTED Final   Streptococcus pneumoniae NOT DETECTED NOT DETECTED Final   Streptococcus pyogenes NOT DETECTED NOT DETECTED Final   A.calcoaceticus-baumannii NOT DETECTED NOT DETECTED Final   Bacteroides fragilis NOT DETECTED NOT DETECTED Final   Enterobacterales NOT DETECTED NOT DETECTED Final   Enterobacter cloacae complex NOT DETECTED NOT DETECTED Final   Escherichia coli NOT DETECTED NOT DETECTED Final   Klebsiella aerogenes NOT DETECTED NOT DETECTED Final   Klebsiella oxytoca NOT DETECTED NOT DETECTED Final   Klebsiella pneumoniae NOT DETECTED NOT DETECTED Final   Proteus species NOT DETECTED NOT DETECTED Final   Salmonella species NOT DETECTED NOT DETECTED Final   Serratia marcescens NOT DETECTED NOT DETECTED Final   Haemophilus influenzae NOT DETECTED NOT DETECTED Final   Neisseria meningitidis NOT DETECTED NOT DETECTED Final   Pseudomonas aeruginosa NOT DETECTED NOT DETECTED Final   Stenotrophomonas  maltophilia NOT DETECTED NOT DETECTED Final   Candida albicans NOT DETECTED NOT DETECTED Final   Candida auris NOT DETECTED NOT DETECTED Final   Candida glabrata NOT DETECTED NOT DETECTED Final   Candida krusei NOT DETECTED NOT DETECTED Final   Candida parapsilosis NOT DETECTED NOT DETECTED Final   Candida tropicalis NOT DETECTED NOT DETECTED Final   Cryptococcus neoformans/gattii NOT DETECTED NOT DETECTED Final    Comment: Performed at [facility], [address] [place], [place] [address]  Resp Panel by RT-PCR (Flu A&B, Covid) Nasopharyngeal Swab     Status: None   Collection Time: 10/18/21  8:16 PM   Specimen: Nasopharyngeal Swab; Nasopharyngeal(NP) swabs in vial transport medium  Result Value Ref Range Status   SARS Coronavirus 2 by RT PCR NEGATIVE NEGATIVE Final    Comment: (NOTE) SARS-CoV-2 target nucleic acids are NOT DETECTED.  The SARS-CoV-2 RNA is generally detectable in upper respiratory specimens during the acute phase of infection. The lowest concentration of SARS-CoV-2 viral copies this assay can detect is 138 copies/mL. A negative result does not preclude SARS-Cov-2 infection and should not be used as the sole basis for treatment or other patient management decisions. A negative result may occur with  improper specimen collection/handling, submission of specimen other than nasopharyngeal swab, presence of viral mutation(s) within the areas targeted  by this assay, and inadequate number of viral copies(<138 copies/mL). A negative result must be combined with clinical observations, patient history, and epidemiological information. The expected result is Negative.  Fact Sheet for Patients:  EntrepreneurPulse.com.au  Fact Sheet for Healthcare Providers:  IncredibleEmployment.be  This test is no t yet approved or cleared by the [place] FDA and  has been authorized for detection and/or diagnosis of SARS-CoV-2 by FDA  under an Emergency Use Authorization (EUA). This EUA will remain  in effect (meaning this test can be used) for the duration of the COVID-19 declaration under Section 564(b)(1) of the Act, 21 U.S.C.section 360bbb-3(b)(1), unless the authorization is terminated  or revoked sooner.       Influenza A by PCR NEGATIVE NEGATIVE Final   Influenza B by PCR NEGATIVE NEGATIVE Final    Comment: (NOTE) The Xpert Xpress SARS-CoV-2/FLU/RSV plus assay is intended as an aid in the diagnosis of influenza from Nasopharyngeal swab specimens and should not be used as a sole basis for treatment. Nasal washings and aspirates are unacceptable for Xpert Xpress SARS-CoV-2/FLU/RSV testing.  Fact Sheet for Patients: EntrepreneurPulse.com.au  Fact Sheet for Healthcare Providers: IncredibleEmployment.be  This test is not yet approved or cleared by the [place] FDA and has been authorized for detection and/or diagnosis of SARS-CoV-2 by FDA under an Emergency Use Authorization (EUA). This EUA will remain in effect (meaning this test can be used) for the duration of the COVID-19 declaration under Section 564(b)(1) of the Act, 21 U.S.C. section 360bbb-3(b)(1), unless the authorization is terminated or revoked.  Performed at [facility], [address] [place], [place] [address]   Culture, blood (Routine X 2) w Reflex to ID Panel     Status: None   Collection Time: 10/18/21 11:28 PM   Specimen: BLOOD  Result Value Ref Range Status   Specimen Description BLOOD LEFT HAND  Final   Special Requests   Final    BOTTLES DRAWN AEROBIC ONLY Blood Culture adequate volume   Culture   Final    NO GROWTH 5 DAYS Performed at [facility], [address], [place], [place] [address]    Report Status 10/23/2021 FINAL  Final  MRSA Next Gen by PCR, Nasal     Status: None   Collection Time: 10/19/21  5:04 AM   Specimen: Nasal Mucosa; Nasal Swab  Result Value  Ref Range Status   MRSA by PCR Next Gen NOT DETECTED NOT DETECTED Final    Comment: (NOTE) The GeneXpert MRSA Assay (FDA approved for NASAL specimens only), is one component of a comprehensive MRSA colonization surveillance program. It is not intended to diagnose MRSA infection nor to guide or monitor treatment for MRSA infections. Test performance is not FDA approved in patients less than 63 years old. Performed at [facility], [address] [place], [place] [address]   CULTURE, BLOOD (ROUTINE X 2) w Reflex to ID Panel     Status: None (Preliminary result)   Collection Time: 10/20/21  6:08 PM   Specimen: BLOOD  Result Value Ref Range Status   Specimen Description BLOOD LFOA  Final   Special Requests BOTTLES DRAWN AEROBIC AND ANAEROBIC BCAV  Final   Culture   Final    NO GROWTH 4 DAYS Performed at [facility], [address] [place], [place] [address]    Report Status PENDING  Incomplete  CULTURE, BLOOD (ROUTINE X 2) w Reflex to ID Panel     Status: None (Preliminary result)  Collection Time: 10/20/21  6:23 PM   Specimen: BLOOD  Result Value Ref Range Status   Specimen Description BLOOD [facility]  Final   Special Requests BOTTLES DRAWN AEROBIC ONLY BCLV  Final   Culture   Final    NO GROWTH 4 DAYS Performed at [facility], [address] [place], [place] [address]    Report Status PENDING  Incomplete       Radiology Studies: CT HUMERUS RIGHT WO CONTRAST  Result Date: 10/22/2021 CLINICAL DATA:  Chronic alcohol abuse.  Shoulder trauma. EXAM: CT OF THE RIGHT HUMERUS WITHOUT CONTRAST CT OF THE RIGHT FOREARM WITHOUT CONTRAST TECHNIQUE: Multidetector CT imaging was performed of the right humerus according to the standard protocol. Multiplanar CT image reconstructions were also generated. Multidetector CT imaging was performed of the right forearm according to the standard protocol. Multiplanar CT image reconstructions were also generated. RADIATION  DOSE REDUCTION: This exam was performed according to the departmental dose-optimization program which includes automated exposure control, adjustment of the mA and/or kV according to patient size and/or use of iterative reconstruction technique. COMPARISON:  None. FINDINGS: Bones/Joint/Cartilage Generalized osteopenia. Severely comminuted and displaced fracture of the surgical neck of right proximal humerus. 4 cm of lateral displacement, 2 cm of superior displacement. Numerous fracture fragments are located along the anterior humeral head. Severely comminuted fracture of the greater tuberosity and lesser tuberosity. Fracture cleft extends to the superior articular surface. No glenohumeral dislocation. Mild arthropathy of the acromioclavicular joint. No acute fracture or dislocation of the radius and ulna. No elbow fracture or dislocation. 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 Ligaments Ligaments are suboptimally evaluated by CT. Muscles and Tendons Focal area of atrophy in the biceps muscle likely reflecting prior injury. Soft tissue edema in the biceps muscle concerning for muscle strain. Soft tissue No fluid collection or hematoma. No soft tissue mass. Severe soft tissue edema throughout the upper arm and forearm with skin thickening which may reflect reactive edema versus cellulitis. No drainable fluid collection. IMPRESSION: 1. Severely comminuted and displaced fracture of the surgical neck of right proximal humerus. 4 cm of lateral displacement, 2 cm of superior displacement. Numerous fracture fragments are located along the anterior humeral head. Severely comminuted fracture of the greater tuberosity and lesser tuberosity. Fracture cleft extends to the superior articular surface. 2.  No acute osseous injury of the right forearm. 3. 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 Electronically Signed   By: Kathreen Devoid M.D.   On: 10/22/2021 11:57    CT FOREARM RIGHT WO CONTRAST  Result Date: 10/22/2021 CLINICAL DATA:  Chronic alcohol abuse.  Shoulder trauma. EXAM: CT OF THE RIGHT HUMERUS WITHOUT CONTRAST CT OF THE RIGHT FOREARM WITHOUT CONTRAST TECHNIQUE: Multidetector CT imaging was performed of the right humerus according to the standard protocol. Multiplanar CT image reconstructions were also generated. Multidetector CT imaging was performed of the right forearm according to the standard protocol. Multiplanar CT image reconstructions were also generated. RADIATION DOSE REDUCTION: This exam was performed according to the departmental dose-optimization program which includes automated exposure control, adjustment of the mA and/or kV according to patient size and/or use of iterative reconstruction technique. COMPARISON:  None. FINDINGS: Bones/Joint/Cartilage Generalized osteopenia. Severely comminuted and displaced fracture of the surgical neck of right proximal humerus. 4 cm of lateral displacement, 2 cm of superior displacement. Numerous fracture fragments are located along the anterior humeral head. Severely comminuted fracture of the greater tuberosity and lesser tuberosity. Fracture cleft extends to  the superior articular surface. No glenohumeral dislocation. Mild arthropathy of the acromioclavicular joint. No acute fracture or dislocation of the radius and ulna. No elbow fracture or dislocation. 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 Ligaments Ligaments are suboptimally evaluated by CT. Muscles and Tendons Focal area of atrophy in the biceps muscle likely reflecting prior injury. Soft tissue edema in the biceps muscle concerning for muscle strain. Soft tissue No fluid collection or hematoma. No soft tissue mass. Severe soft tissue edema throughout the upper arm and forearm with skin thickening which may reflect reactive edema versus cellulitis. No drainable fluid collection. IMPRESSION: 1. Severely comminuted and  displaced fracture of the surgical neck of right proximal humerus. 4 cm of lateral displacement, 2 cm of superior displacement. Numerous fracture fragments are located along the anterior humeral head. Severely comminuted fracture of the greater tuberosity and lesser tuberosity. Fracture cleft extends to the superior articular surface. 2.  No acute osseous injury of the right forearm. 3. 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 Electronically Signed   By: Kathreen Devoid M.D.   On: 10/22/2021 11:57      Scheduled Meds:  Chlorhexidine Gluconate Cloth  6 each Topical X0383   folic acid  1 mg Oral Daily   insulin aspart  0-15 Units Subcutaneous TID WC   multivitamin with minerals  1 tablet Oral Daily   nicotine  14 mg Transdermal Daily   pantoprazole  40 mg Oral QHS   spiritus frumenti  1 each Oral BID AC   thiamine  100 mg Oral Daily   Or   thiamine  100 mg Intravenous Daily   Continuous Infusions:  sodium chloride Stopped (10/19/21 0149)   sodium chloride     cefTRIAXone (ROCEPHIN)  IV 2 g (10/23/21 2153)     LOS: 6 days    Dessa Phi, DO [facility] 10/24/2021, 9:21 AM   Available via Epic secure chat 7am-7pm After these hours, please refer to coverage provider listed on [email or web address]

## 2021-10-25 DIAGNOSIS — R7881 Bacteremia: Secondary | ICD-10-CM | POA: Diagnosis not present

## 2021-10-25 DIAGNOSIS — B955 Unspecified streptococcus as the cause of diseases classified elsewhere: Secondary | ICD-10-CM | POA: Diagnosis not present

## 2021-10-25 DIAGNOSIS — N189 Chronic kidney disease, unspecified: Secondary | ICD-10-CM

## 2021-10-25 DIAGNOSIS — M6282 Rhabdomyolysis: Secondary | ICD-10-CM | POA: Diagnosis not present

## 2021-10-25 DIAGNOSIS — Z7189 Other specified counseling: Secondary | ICD-10-CM

## 2021-10-25 DIAGNOSIS — S4291XA Fracture of right shoulder girdle, part unspecified, initial encounter for closed fracture: Secondary | ICD-10-CM | POA: Diagnosis not present

## 2021-10-25 DIAGNOSIS — E1122 Type 2 diabetes mellitus with diabetic chronic kidney disease: Secondary | ICD-10-CM

## 2021-10-25 LAB — CBC
HCT: 30.6 % — ABNORMAL LOW (ref 39.0–52.0)
Hemoglobin: 10.3 g/dL — ABNORMAL LOW (ref 13.0–17.0)
MCH: 33 pg (ref 26.0–34.0)
MCHC: 33.7 g/dL (ref 30.0–36.0)
MCV: 98.1 fL (ref 80.0–100.0)
Platelets: 427 10*3/uL — ABNORMAL HIGH (ref 150–400)
RBC: 3.12 MIL/uL — ABNORMAL LOW (ref 4.22–5.81)
RDW: 18.5 % — ABNORMAL HIGH (ref 11.5–15.5)
WBC: 16.6 10*3/uL — ABNORMAL HIGH (ref 4.0–10.5)
nRBC: 0.8 % — ABNORMAL HIGH (ref 0.0–0.2)

## 2021-10-25 LAB — GLUCOSE, CAPILLARY
Glucose-Capillary: 103 mg/dL — ABNORMAL HIGH (ref 70–99)
Glucose-Capillary: 107 mg/dL — ABNORMAL HIGH (ref 70–99)
Glucose-Capillary: 131 mg/dL — ABNORMAL HIGH (ref 70–99)

## 2021-10-25 LAB — CULTURE, BLOOD (ROUTINE X 2)
Culture: NO GROWTH
Culture: NO GROWTH

## 2021-10-25 LAB — BASIC METABOLIC PANEL
Anion gap: 10 (ref 5–15)
BUN: 17 mg/dL (ref 8–23)
CO2: 24 mmol/L (ref 22–32)
Calcium: 8.7 mg/dL — ABNORMAL LOW (ref 8.9–10.3)
Chloride: 96 mmol/L — ABNORMAL LOW (ref 98–111)
Creatinine, Ser: 1.66 mg/dL — ABNORMAL HIGH (ref 0.61–1.24)
GFR, Estimated: 44 mL/min — ABNORMAL LOW (ref >60)
Glucose, Bld: 117 mg/dL — ABNORMAL HIGH (ref 70–99)
Potassium: 3.6 mmol/L (ref 3.5–5.1)
Sodium: 130 mmol/L — ABNORMAL LOW (ref 135–145)

## 2021-10-25 MED ORDER — ONDANSETRON HCL 4 MG/2ML IJ SOLN: 4.0000 mg | Freq: Four times a day (QID) | INTRAMUSCULAR | Status: DC | PRN

## 2021-10-25 MED ORDER — OXYCODONE-ACETAMINOPHEN 5-325 MG PO TABS
1.0000 | ORAL_TABLET | ORAL | Status: DC | PRN
  Administered 2021-10-25 – 2021-10-26 (×4): 2 via ORAL
  Administered 2021-10-26: 1 via ORAL
  Filled 2021-10-25 (×2): qty 2
  Filled 2021-10-25: qty 1
  Filled 2021-10-25 (×2): qty 2

## 2021-10-25 MED ORDER — CEFAZOLIN SODIUM-DEXTROSE 2-4 GM/100ML-% IV SOLN
2.0000 g | Freq: Three times a day (TID) | INTRAVENOUS | Status: DC
  Administered 2021-10-25 – 2021-10-26 (×3): 2 g via INTRAVENOUS
  Filled 2021-10-25 (×6): qty 100

## 2021-10-25 NOTE — Progress Notes (Addendum)
PROGRESS NOTE    Edward Singleton  ZOX:[id number] DOB: 10-Jan-1951 DOA: 10/18/2021 PCP: Windy Fast, MD     Brief Narrative:  Edward Singleton is a 71 year old male with long history of chronic alcohol use was reportedly found down in his house.  He was last seen well several days ago.  The patient bedside is alert and responsive but confused as to the timetable of the last few days.  He reports that he had been drinking too much alcohol and fell.  He believes he fell on Sunday but is unclear as to what happened or the timing of the event.  Police were called to the home and had to break down the door finding him on the floor with significant bruising lying on his right arm.  Per ED/EMS report the patient was confused, hypothermic and hypotensive receiving IV fluids.  Per Tamela Oddi, who is healthcare power of attorney, the patient has been binge drinking since the summer.  Upon arrival in the ER patient complaining of cold and thirst, became nauseous with some vomiting episodes.  He received warmed IV fluid, empiric antibiotics.  On-call orthopedist was consulted with additional imaging ordered and instructions to place right arm in a sling.  After aggressive IV fluid resuscitation patient requiring vasopressor support.  He was weaned off levophed on 1/10 and transferred to [facility] service 1/11.   New events last 24 hours / Subjective: Continues to complain of right upper extremity pain.  Last time he received Dilaudid was around midnight.  Assessment & Plan:   Principal Problem:   Rhabdomyolysis Active Problems:   AKI (acute kidney injury) ([place])   Metabolic acidosis   Shock ([place])   Septic and hypovolemic shock Sepsis secondary to strep bacteremia  -Now off levophed  -Blood culture showing strep gallolyticus, starph epidermidis 1/9 -Repeat blood culture negative 1/11 -Infectious disease following -Remains on ceftriaxone -CT right upper extremity completed which revealed reactive edema  versus cellulitis, no drainable fluid collection. -Patient remains afebrile, WBC improving today   Rhabdomyolysis -CK improved  Closed comminuted right proximal humerus fracture -Orthopedic surgery consulted, initially had planned on transferring to [facility] for surgical fixation, but now recommendation is for outpatient follow-up with elective repair -Remains in right shoulder sling for now -PT recommending SNF placement -Sister would like to discuss surgery option with orthopedic surgery. Sent message to Dr. Mack Guise to call sister today   Alcohol abuse with risk for DTs -Continue to monitor withdrawal on CIWA protocol -Receiving alcohol TID --> deescalated to BID   AKI on CKD stage IV -Baseline creatinine 2.6 -Nephrology following -Resolved   Acute urinary retention -Foley catheter placed 1/13 -He will need voiding trial prior to discharge  Symptomatic anemia, acute blood loss anemia  -Likely some blood loss component into hematoma of the right upper extremity -Hemoglobin 11.3 >> 8.6 >> 7.6 >> 7.2  -Transfuse 1 unit packed red blood cell in setting of hypoxemia and anemia -Chest x-ray did not reveal pulmonary edema -Change subq hep to SCDs  Acute hypoxic respiratory failure -Remains on O2, wean as able   Tobacco abuse -Nicotine patch    DVT prophylaxis: SCD  Code Status: Full Family Communication: None at bedside, spoke w sister Casilda Carls over phone    Disposition Plan:  Status is: Inpatient  Remains inpatient appropriate because: Remains on IV antibiotics, needs SNF placement     Antimicrobials:  Anti-infectives (From admission, onward)    Start     Dose/Rate Route Frequency Ordered  Stop   10/20/21 2000  cefTRIAXone (ROCEPHIN) 2 g in sodium chloride 0.9 % 100 mL IVPB        2 g 200 mL/hr over 30 Minutes Intravenous Every 24 hours 10/20/21 0824     10/19/21 2000  ceFEPIme (MAXIPIME) 2 g in sodium chloride 0.9 % 100 mL IVPB  Status:  Discontinued         2 g 200 mL/hr over 30 Minutes Intravenous Every 24 hours 10/19/21 1407 10/20/21 0824   10/19/21 0700  vancomycin (VANCOCIN) IVPB 1000 mg/200 mL premix  Status:  Discontinued        1,000 mg 200 mL/hr over 60 Minutes Intravenous  Once 10/19/21 0606 10/19/21 0609   10/19/21 0700  vancomycin (VANCOREADY) IVPB 750 mg/150 mL        750 mg 150 mL/hr over 60 Minutes Intravenous  Once 10/19/21 0609 10/19/21 0729   10/18/21 1815  ceFEPIme (MAXIPIME) 2 g in sodium chloride 0.9 % 100 mL IVPB        2 g 200 mL/hr over 30 Minutes Intravenous  Once 10/18/21 1812 10/18/21 2023   10/18/21 1815  metroNIDAZOLE (FLAGYL) IVPB 500 mg        500 mg 100 mL/hr over 60 Minutes Intravenous  Once 10/18/21 1812 10/18/21 2225   10/18/21 1815  vancomycin (VANCOCIN) IVPB 1000 mg/200 mL premix        1,000 mg 200 mL/hr over 60 Minutes Intravenous  Once 10/18/21 1812 10/18/21 2326        Objective: Vitals:   10/24/21 1544 10/24/21 1936 10/25/21 0448 10/25/21 0827  BP: (!) 143/80 129/70 140/82 127/90  Pulse: 96 94 (!) 105 99  Resp: 18 20 20 18   Temp: 98 F (36.7 C) 97.8 F (36.6 C) 98.9 F (37.2 C)   TempSrc:  Oral Oral   SpO2: 92% 98% 96% 96%  Weight:      Height:        Intake/Output Summary (Last 24 hours) at 10/25/2021 0958 Last data filed at 10/25/2021 0500 Gross per 24 hour  Intake 505.67 ml  Output 500 ml  Net 5.67 ml    Filed Weights   10/20/21 0500 10/21/21 0500 10/22/21 0500  Weight: 72.5 kg 66.3 kg 66.2 kg    Examination: General exam: Appears calm Respiratory system: Clear to auscultation. Respiratory effort normal. [place] O2  Cardiovascular system: S1 & S2 heard, RR. No pedal edema. Gastrointestinal system: Abdomen is nondistended, soft and nontender. Normal bowel sounds heard. Central nervous system: Alert and oriented. Non focal exam. Speech clear  Extremities: RUE with swelling and diffuse ecchymosis  Psychiatry: Mood & affect appropriate.    Data Reviewed: I have  personally reviewed following labs and imaging studies  CBC: Recent Labs  Lab 10/18/21 1800 10/19/21 0504 10/21/21 0534 10/22/21 0444 10/23/21 0516 10/24/21 0456 10/25/21 0422  WBC 24.3*   < > 18.6* 22.8* 19.5* 19.1* 16.6*  NEUTROABS 20.4*  --   --   --   --   --   --   HGB 11.3*   < > 7.2* 10.0* 10.0* 9.9* 10.3*  HCT 34.4*   < > 21.6* 29.3* 29.5* 28.8* 30.6*  MCV 104.9*   < > 100.0 96.7 97.0 96.3 98.1  PLT 275   < > 200 266 290 360 427*   < > = values in this interval not displayed.    Basic Metabolic Panel: Recent Labs  Lab 10/18/21 1800 10/18/21 2016 10/19/21 0504 10/19/21 1402 10/21/21  2952 10/22/21 0444 10/23/21 0516 10/24/21 0456 10/25/21 0422  NA 134* 134* 130*   < > 132* 134* 132* 133* 130*  K 3.5 3.5 2.8*   < > 3.2* 4.1 3.7 3.7 3.6  CL 91* 100 92*   < > 94* 100 96* 99 96*  CO2 9* 10* 23   < > 29 26 25 23 24   GLUCOSE 170* 192* 214*   < > 161* 129* 115* 120* 117*  BUN 64* 59* 59*   < > 27* 23 19 17 17   CREATININE 4.30* 3.68* 3.32*   < > 1.93* 1.91* 1.65* 1.67* 1.66*  CALCIUM 8.1* 6.7* 7.5*   < > 7.9* 8.4* 8.3* 8.6* 8.7*  MG 2.6*  --  1.7  --   --  1.6* 2.0  --   --   PHOS  --  7.6* 4.9*  --   --   --   --   --   --    < > = values in this interval not displayed.    GFR: Estimated Creatinine Clearance: 39.3 mL/min (A) (by C-G formula based on SCr of 1.66 mg/dL (H)). Liver Function Tests: Recent Labs  Lab 10/18/21 1800 10/19/21 0504 10/20/21 0650  AST 37 46* 42*  ALT 19 21 23   ALKPHOS 100 83 73  BILITOT 1.0 1.0 1.1  PROT 6.2* 5.3* 5.1*  ALBUMIN 3.4* 3.0* 2.7*    Recent Labs  Lab 10/18/21 1800 10/19/21 0504  LIPASE 57* 112*    No results for input(s): AMMONIA in the last 168 hours. Coagulation Profile: No results for input(s): INR, PROTIME in the last 168 hours. Cardiac Enzymes: Recent Labs  Lab 10/18/21 1800 10/19/21 0504 10/20/21 0650 10/21/21 0534 10/22/21 0444  CKTOTAL 990* 2,341* 1,503* 861* 581*    BNP (last 3 results) No  results for input(s): PROBNP in the last 8760 hours. HbA1C: No results for input(s): HGBA1C in the last 72 hours.  CBG: Recent Labs  Lab 10/23/21 1711 10/24/21 0754 10/24/21 1045 10/24/21 1636 10/25/21 0823  GLUCAP 103* 94 134* 93 103*    Lipid Profile: No results for input(s): CHOL, HDL, LDLCALC, TRIG, CHOLHDL, LDLDIRECT in the last 72 hours. Thyroid Function Tests: No results for input(s): TSH, T4TOTAL, FREET4, T3FREE, THYROIDAB in the last 72 hours.  Anemia Panel: No results for input(s): VITAMINB12, FOLATE, FERRITIN, TIBC, IRON, RETICCTPCT in the last 72 hours. Sepsis Labs: Recent Labs  Lab 10/18/21 1800 10/18/21 1824 10/18/21 1950 10/19/21 0125 10/19/21 0504 10/20/21 0650  PROCALCITON 1.25  --   --   --  1.34 0.89  LATICACIDVEN  --  8.2* 5.3* 2.1*  --   --      Recent Results (from the past 240 hour(s))  Blood culture (routine x 2)     Status: Abnormal   Collection Time: 10/18/21  6:24 PM   Specimen: BLOOD  Result Value Ref Range Status   Specimen Description   Final    BLOOD FOOT Performed at [facility], [address], [place], [place] [address]    Special Requests   Final    BOTTLES DRAWN AEROBIC AND ANAEROBIC BCAV Performed at [facility], [address] [place], [place] [address]    Culture  Setup Time   Final    Organism ID to follow IN BOTH AEROBIC AND ANAEROBIC BOTTLES GRAM POSITIVE COCCI CRITICAL RESULT CALLED TO, READ BACK BY AND VERIFIED WITH: NATHAN BEULE 10/19/21 0602 MW    Culture (A)  Final  STREPTOCOCCUS GALLOLYTICUS STAPHYLOCOCCUS EPIDERMIDIS THE SIGNIFICANCE OF ISOLATING THIS ORGANISM FROM A SINGLE SET OF BLOOD CULTURES WHEN MULTIPLE SETS ARE DRAWN IS UNCERTAIN. PLEASE NOTIFY THE MICROBIOLOGY DEPARTMENT WITHIN ONE WEEK IF SPECIATION AND SENSITIVITIES ARE REQUIRED. CRITICAL RESULT CALLED TO, READ BACK BY AND VERIFIED WITH: [place]. [id number] FCP Performed at [facility], [address],  [place], [place] [address]    Report Status 10/24/2021 FINAL  Final   Organism ID, Bacteria STREPTOCOCCUS GALLOLYTICUS  Final      Susceptibility   Streptococcus gallolyticus - MIC*    PENICILLIN 0.12 SENSITIVE Sensitive     CEFTRIAXONE <=0.12 SENSITIVE Sensitive     ERYTHROMYCIN <=0.12 SENSITIVE Sensitive     LEVOFLOXACIN 2 SENSITIVE Sensitive     VANCOMYCIN 0.5 SENSITIVE Sensitive     * STREPTOCOCCUS GALLOLYTICUS  Blood Culture ID Panel (Reflexed)     Status: Abnormal   Collection Time: 10/18/21  6:24 PM  Result Value Ref Range Status   Enterococcus faecalis NOT DETECTED NOT DETECTED Final   Enterococcus Faecium NOT DETECTED NOT DETECTED Final   Listeria monocytogenes NOT DETECTED NOT DETECTED Final   Staphylococcus species NOT DETECTED NOT DETECTED Final   Staphylococcus aureus (BCID) NOT DETECTED NOT DETECTED Final   Staphylococcus epidermidis NOT DETECTED NOT DETECTED Final   Staphylococcus lugdunensis NOT DETECTED NOT DETECTED Final   Streptococcus species DETECTED (A) NOT DETECTED Final    Comment: Not Enterococcus species, Streptococcus agalactiae, Streptococcus pyogenes, or Streptococcus pneumoniae. CRITICAL RESULT CALLED TO, READ BACK BY AND VERIFIED WITH: NATHAN BEULE 10/19/21 0602 MW    Streptococcus agalactiae NOT DETECTED NOT DETECTED Final   Streptococcus pneumoniae NOT DETECTED NOT DETECTED Final   Streptococcus pyogenes NOT DETECTED NOT DETECTED Final   A.calcoaceticus-baumannii NOT DETECTED NOT DETECTED Final   Bacteroides fragilis NOT DETECTED NOT DETECTED Final   Enterobacterales NOT DETECTED NOT DETECTED Final   Enterobacter cloacae complex NOT DETECTED NOT DETECTED Final   Escherichia coli NOT DETECTED NOT DETECTED Final   Klebsiella aerogenes NOT DETECTED NOT DETECTED Final   Klebsiella oxytoca NOT DETECTED NOT DETECTED Final   Klebsiella pneumoniae NOT DETECTED NOT DETECTED Final   Proteus species NOT DETECTED NOT DETECTED Final   Salmonella species NOT  DETECTED NOT DETECTED Final   Serratia marcescens NOT DETECTED NOT DETECTED Final   Haemophilus influenzae NOT DETECTED NOT DETECTED Final   Neisseria meningitidis NOT DETECTED NOT DETECTED Final   Pseudomonas aeruginosa NOT DETECTED NOT DETECTED Final   Stenotrophomonas maltophilia NOT DETECTED NOT DETECTED Final   Candida albicans NOT DETECTED NOT DETECTED Final   Candida auris NOT DETECTED NOT DETECTED Final   Candida glabrata NOT DETECTED NOT DETECTED Final   Candida krusei NOT DETECTED NOT DETECTED Final   Candida parapsilosis NOT DETECTED NOT DETECTED Final   Candida tropicalis NOT DETECTED NOT DETECTED Final   Cryptococcus neoformans/gattii NOT DETECTED NOT DETECTED Final    Comment: Performed at [facility], [address] [place], [place] [address]  Resp Panel by RT-PCR (Flu A&B, Covid) Nasopharyngeal Swab     Status: None   Collection Time: 10/18/21  8:16 PM   Specimen: Nasopharyngeal Swab; Nasopharyngeal(NP) swabs in vial transport medium  Result Value Ref Range Status   SARS Coronavirus 2 by RT PCR NEGATIVE NEGATIVE Final    Comment: (NOTE) SARS-CoV-2 target nucleic acids are NOT DETECTED.  The SARS-CoV-2 RNA is generally detectable in upper respiratory specimens during the acute phase of infection. The lowest concentration of SARS-CoV-2  viral copies this assay can detect is 138 copies/mL. A negative result does not preclude SARS-Cov-2 infection and should not be used as the sole basis for treatment or other patient management decisions. A negative result may occur with  improper specimen collection/handling, submission of specimen other than nasopharyngeal swab, presence of viral mutation(s) within the areas targeted by this assay, and inadequate number of viral copies(<138 copies/mL). A negative result must be combined with clinical observations, patient history, and epidemiological information. The expected result is Negative.  Fact Sheet for Patients:   EntrepreneurPulse.com.au  Fact Sheet for Healthcare Providers:  IncredibleEmployment.be  This test is no t yet approved or cleared by the [place] FDA and  has been authorized for detection and/or diagnosis of SARS-CoV-2 by FDA under an Emergency Use Authorization (EUA). This EUA will remain  in effect (meaning this test can be used) for the duration of the COVID-19 declaration under Section 564(b)(1) of the Act, 21 U.S.C.section 360bbb-3(b)(1), unless the authorization is terminated  or revoked sooner.       Influenza A by PCR NEGATIVE NEGATIVE Final   Influenza B by PCR NEGATIVE NEGATIVE Final    Comment: (NOTE) The Xpert Xpress SARS-CoV-2/FLU/RSV plus assay is intended as an aid in the diagnosis of influenza from Nasopharyngeal swab specimens and should not be used as a sole basis for treatment. Nasal washings and aspirates are unacceptable for Xpert Xpress SARS-CoV-2/FLU/RSV testing.  Fact Sheet for Patients: EntrepreneurPulse.com.au  Fact Sheet for Healthcare Providers: IncredibleEmployment.be  This test is not yet approved or cleared by the [place] FDA and has been authorized for detection and/or diagnosis of SARS-CoV-2 by FDA under an Emergency Use Authorization (EUA). This EUA will remain in effect (meaning this test can be used) for the duration of the COVID-19 declaration under Section 564(b)(1) of the Act, 21 U.S.C. section 360bbb-3(b)(1), unless the authorization is terminated or revoked.  Performed at [facility], [address] [place], [place] [address]   Culture, blood (Routine X 2) w Reflex to ID Panel     Status: None   Collection Time: 10/18/21 11:28 PM   Specimen: BLOOD  Result Value Ref Range Status   Specimen Description BLOOD LEFT HAND  Final   Special Requests   Final    BOTTLES DRAWN AEROBIC ONLY Blood Culture adequate volume   Culture   Final    NO  GROWTH 5 DAYS Performed at [facility], [address] [place], [place] [address]    Report Status 10/23/2021 FINAL  Final  MRSA Next Gen by PCR, Nasal     Status: None   Collection Time: 10/19/21  5:04 AM   Specimen: Nasal Mucosa; Nasal Swab  Result Value Ref Range Status   MRSA by PCR Next Gen NOT DETECTED NOT DETECTED Final    Comment: (NOTE) The GeneXpert MRSA Assay (FDA approved for NASAL specimens only), is one component of a comprehensive MRSA colonization surveillance program. It is not intended to diagnose MRSA infection nor to guide or monitor treatment for MRSA infections. Test performance is not FDA approved in patients less than 19 years old. Performed at [facility], [address] [place], [place] [address]   CULTURE, BLOOD (ROUTINE X 2) w Reflex to ID Panel     Status: None   Collection Time: 10/20/21  6:08 PM   Specimen: BLOOD  Result Value Ref Range Status   Specimen Description BLOOD LFOA  Final   Special Requests BOTTLES DRAWN AEROBIC AND  ANAEROBIC BCAV  Final   Culture   Final    NO GROWTH 5 DAYS Performed at [facility], [address] [place], [place] [address]    Report Status 10/25/2021 FINAL  Final  CULTURE, BLOOD (ROUTINE X 2) w Reflex to ID Panel     Status: None   Collection Time: 10/20/21  6:23 PM   Specimen: BLOOD  Result Value Ref Range Status   Specimen Description BLOOD [facility]  Final   Special Requests BOTTLES DRAWN AEROBIC ONLY BCLV  Final   Culture   Final    NO GROWTH 5 DAYS Performed at [facility], [address] [place], [place] [address]    Report Status 10/25/2021 FINAL  Final       Radiology Studies: No results found.    Scheduled Meds:  Chlorhexidine Gluconate Cloth  6 each Topical F8403   folic acid  1 mg Oral Daily   insulin aspart  0-15 Units Subcutaneous TID WC   multivitamin with minerals  1 tablet Oral Daily   nicotine  14 mg Transdermal Daily   pantoprazole  40 mg  Oral QHS   spiritus frumenti  1 each Oral BID AC   thiamine  100 mg Oral Daily   Or   thiamine  100 mg Intravenous Daily   Continuous Infusions:  sodium chloride Stopped (10/19/21 0149)   sodium chloride     cefTRIAXone (ROCEPHIN)  IV 2 g (10/24/21 2011)     LOS: 7 days    Dessa Phi, DO [facility] 10/25/2021, 9:58 AM   Available via Epic secure chat 7am-7pm After these hours, please refer to coverage provider listed on [email or web address]

## 2021-10-25 NOTE — Progress Notes (Signed)
Occupational Therapy Treatment Patient Details Name: SAYRE WITHERINGTON MRN: [id number] DOB: 04-06-1951 Today's Date: 10/25/2021   History of present illness Pt is a 71 y/o M admitted on 10/18/21 after being found down in his house. Pt reports he had been drinking too much & fell. Pt was confused, hypothermic & hypotensive. Pt is being treated for septic 2/2 strep bacteremia & hypovolemic shock. Pt found to have closed comminuted R proximal humerus fx with recommendations to use R shoulder sling now & planning for transfer to [facility] once medically stable for surgical evaluation by Dr. Marcelino Scot & Dr. Doreatha Martin. Per ortho conversation with Dr. Doreatha Martin but will require arthroplasty but will schedule this as an outpatient. PMH: chronic alcohol use   OT comments  Pt seen for OT treatment on this date. Upon arrival to room, pt in bed with bed linen soiled & RUE weeping; RN made and aware and present during session to change bandaging and provide pain medication. Pt agreeable to session, however demonstating questionable safety awareness (sling doffed at beginning of session - pt unable to elaborate reasoning). Pt was able to state that he is in a hospital, but was asking what town he was in. Pt re-educated in RUE precautions, adaptive strategies for dressing, and application of sling to maintain NWBing precaution. Pt endorsed some difficulty recalling all education and would benefit from additional education in subsequent session. Patient also presents with impaired strength/ROM of RUE, RUE pain, and decreased balance. These impairments result in a decreased ability to perform self care tasks requiring MOD A for UB dressing, MAX A for donning sling, and MIN A for functional transfers. Pt continues to benefit from skilled OT services to maximize return to PLOF and minimize risk of future falls, injury, caregiver burden, and readmission. Will continue to follow POC. Discharge recommendation remains appropriate.      Recommendations for follow up therapy are one component of a multi-disciplinary discharge planning process, led by the attending physician.  Recommendations may be updated based on patient status, additional functional criteria and insurance authorization.    Follow Up Recommendations  Skilled nursing-short term rehab (<3 hours/day)    Assistance Recommended at Discharge Frequent or constant Supervision/Assistance  Patient can return home with the following  A little help with walking and/or transfers;A lot of help with bathing/dressing/bathroom   Equipment Recommendations  Other (comment) (defer to next level of care)       Precautions / Restrictions Precautions Precautions: Fall Required Braces or Orthoses: Sling Restrictions Weight Bearing Restrictions: Yes RUE Weight Bearing: Non weight bearing Other Position/Activity Restrictions: sling on at all times       Mobility Bed Mobility Overal bed mobility: Needs Assistance Bed Mobility: Supine to Sit     Supine to sit: Mod assist;HOB elevated     General bed mobility comments: sling doffed warranting increased assist for maintaining NWB of R UE during bed mobility    Transfers Overall transfer level: Needs assistance Equipment used: 1 person hand held assist Transfers: Sit to/from Stand Sit to Stand: Min assist   Step pivot transfers: Min assist       General transfer comment: Requires MIN A via 1-person HHA     Balance Overall balance assessment: Needs assistance Sitting-balance support: Feet supported;Single extremity supported Sitting balance-Leahy Scale: Fair Sitting balance - Comments: supervision sitting EOB   Standing balance support: Single extremity supported;During functional activity Standing balance-Leahy Scale: Fair Standing balance comment: Requires MIN GUARD fot static stand balance  ADL either performed or assessed with clinical judgement   ADL Overall  ADL's : Needs assistance/impaired                                       General ADL Comments: requires MOD A for UB ADLs d/t R UE restrictions, MAX A for donning sling. Requires MIN A for ADL transfers with HHA.      Cognition Arousal/Alertness: Awake/alert Behavior During Therapy: WFL for tasks assessed/performed Overall Cognitive Status: No family/caregiver present to determine baseline cognitive functioning                                 General Comments: Pt agreeable to session, however demonstating questionable safety awareness (sling doffed at beginning of session with pt unable to elaborate reasoning). Pt able to state that he is in a hospital, but was unaware he was in [place] and endorsed some difficulty recalling all education provided during session                General Comments While on RA, SpO2 87% following functional mobility. SpO2 increased to 92% with 1L of O2 via [place]    Pertinent Vitals/ Pain       Pain Assessment: 0-10 Pain Score: 8  Pain Location: RUE Pain Descriptors / Indicators: Discomfort;Grimacing Pain Intervention(s): Monitored during session;RN gave pain meds during session;Repositioned         Frequency  Min 2X/week        Progress Toward Goals  OT Goals(current goals can now be found in the care plan section)  Progress towards OT goals: Progressing toward goals  Acute Rehab OT Goals Patient Stated Goal: to manage pain and get better OT Goal Formulation: With patient Time For Goal Achievement: 11/05/21 Potential to Achieve Goals: Good  Plan Discharge plan remains appropriate;Frequency remains appropriate       AM-PAC OT "6 Clicks" Daily Activity     Outcome Measure   Help from another person eating meals?: A Little Help from another person taking care of personal grooming?: A Little Help from another person toileting, which includes using toliet, bedpan, or urinal?: A Lot Help from another person  bathing (including washing, rinsing, drying)?: A Lot Help from another person to put on and taking off regular upper body clothing?: A Lot Help from another person to put on and taking off regular lower body clothing?: A Lot 6 Click Score: 14    End of Session Equipment Utilized During Treatment: Other (comment) (sling)  OT Visit Diagnosis: Unsteadiness on feet (R26.81);Muscle weakness (generalized) (M62.81);Pain Pain - Right/Left: Right Pain - part of body: Shoulder   Activity Tolerance Patient tolerated treatment well   Patient Left in chair;with call bell/phone within reach;with chair alarm set   Nurse Communication Mobility status        Time: 4037-0964 OT Time Calculation (min): 41 min  Charges: OT General Charges $OT Visit: 1 Visit OT Treatments $Self Care/Home Management : 38-52 mins  Fredirick Maudlin, OTR/L [place]

## 2021-10-25 NOTE — Consult Note (Signed)
Consultation Note Date: 10/25/2021   Patient Name: Edward Singleton  DOB: Mar 05, 1951  MRN: [id number]  Age / Sex: 71 y.o., male  PCP: Windy Fast, MD Referring Physician: Dessa Phi, DO  Reason for Consultation: Establishing goals of care  HPI/Patient Profile: Edward Singleton is a 71 year old male with long history of chronic alcohol use was reportedly found down in his house.  He was last seen well several days ago.  The patient bedside is alert and responsive but confused as to the timetable of the last few days.  He reports that he had been drinking too much alcohol and fell.  He believes he fell on Sunday but is unclear as to what happened or the timing of the event.  Police were called to the home and had to break down the door finding him on the floor with significant bruising lying on his right arm.  Per ED/EMS report the patient was confused, hypothermic and hypotensive receiving IV fluids.  Clinical Assessment and Goals of Care: Patient is sitting in bed. He greets me and immediately tells me he needs to stop drinking.   He tells me he lives alone, he is divorced. He has not children. He states his sister is his HPOA. He states he has drank since [place]. He tells me he does not drink daily.    We discussed his diagnosis, prognosis, GOC, EOL wishes disposition and options.   Created space and opportunity for patient  to explore thoughts and feelings regarding current medical information.   A detailed discussion was had today regarding advanced directives.  Concepts specific to code status, artifical feeding and hydration, IV antibiotics and rehospitalization were discussed.  The difference between an aggressive medical intervention path and a comfort care path was discussed.  Values and goals of care important to patient and family were attempted to be elicited.  Discussed limitations of medical  interventions to prolong quality of life in some situations and discussed the concept of human mortality.  He would like full code/full scope care.       SUMMARY OF RECOMMENDATIONS   Full code/ full scope.  Agree with Oxycodone 1-2 q 4 hours PRN. Could change to Oxy IR and add scheduled Tylenol 500mg  q 6 hours. Would also add PRN Flexeril 10mg  for muscle spasms.   Discussed patient with RN.      Primary Diagnoses: Present on Admission:  Rhabdomyolysis   I have reviewed the medical record, interviewed the patient and family, and examined the patient. The following aspects are pertinent.  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   Diabetes mellitus without complication ([place])    Gout    Gout    Hypercholesteremia    Hypertension    Kidney stones    Left flank pain, chronic    Pelvic fracture ([place]) 11/17/2014   Social History   Socioeconomic  History   Marital status: Single    Spouse name: Not on file   Number of children: Not on file   Years of education: Not on file   Highest education level: Not on file  Occupational History   Not on file  Tobacco Use   Smoking status: Every Day    Packs/day: 1.00    Years: 50.00    Pack years: 50.00    Types: Cigarettes   Smokeless tobacco: Former    Types: Nurse, children's Use: Never used  Substance and Sexual Activity   Alcohol use: Not Currently    Alcohol/week: 42.0 standard drinks    Types: 42 Cans of beer per week    Comment: quit june 4th   Drug use: No   Sexual activity: Not on file  Other Topics Concern   Not on file  Social History Narrative   Not on file   Social Determinants of Health   Financial Resource Strain: Not on file  Food Insecurity: Not on file  Transportation Needs: Not on file  Physical Activity: Not on file  Stress: Not on file  Social Connections: Not on file   Family  History  Problem Relation Age of Onset   Heart disease Mother    Heart disease Father    Diabetes Father    Nephrolithiasis Paternal Grandfather    Kidney disease Neg Hx    Prostate cancer Neg Hx    Scheduled Meds:  Chlorhexidine Gluconate Cloth  6 each Topical H8850   folic acid  1 mg Oral Daily   insulin aspart  0-15 Units Subcutaneous TID WC   multivitamin with minerals  1 tablet Oral Daily   nicotine  14 mg Transdermal Daily   pantoprazole  40 mg Oral QHS   spiritus frumenti  1 each Oral BID AC   thiamine  100 mg Oral Daily   Or   thiamine  100 mg Intravenous Daily   Continuous Infusions:  sodium chloride Stopped (10/19/21 0149)   sodium chloride     cefTRIAXone (ROCEPHIN)  IV 2 g (10/24/21 2011)   PRN Meds:.sodium chloride, albuterol, docusate sodium, HYDROmorphone, oxyCODONE-acetaminophen, polyethylene glycol Medications Prior to Admission:  Prior to Admission medications   Medication Sig Start Date End Date Taking? Authorizing Provider  allopurinol (ZYLOPRIM) 300 MG tablet Take 150 mg by mouth daily.    [provider]  amLODipine (NORVASC) 5 MG tablet Take 5 mg by mouth daily at 12 noon. 06/04/19   [provider]  aspirin 81 MG chewable tablet Chew 81 mg by mouth daily.    [provider]  escitalopram (LEXAPRO) 10 MG tablet Take 10 mg by mouth daily.    [provider]  folic acid (FOLVITE) 1 MG tablet Take 1 mg by mouth daily.    [provider]  metFORMIN (GLUCOPHAGE) 500 MG tablet Take 500 mg by mouth 2 (two) times daily. 06/04/19   [provider]  metoprolol succinate (TOPROL-XL) 50 MG 24 hr tablet Take 0.5 tablets by mouth daily. 06/04/19   [provider]  rosuvastatin (CRESTOR) 20 MG tablet Take 20 mg by mouth daily.    [provider]  thiamine (VITAMIN B-1) 100 MG tablet Take 100 mg by mouth daily.    [provider]  traZODone (DESYREL) 50 MG tablet Take 50 mg by mouth daily.     [provider]   No Known Allergies Review of Systems  Musculoskeletal:  Arm pain   Physical Exam Pulmonary:     Effort: Pulmonary effort is normal.  Musculoskeletal:     Comments: Right arm bruised and edematous. Known shoulder fx.   Neurological:     Mental Status: He is alert.    Vital Signs: BP 127/90 (BP Location: Left Leg)    Pulse 99    Temp 98.9 F (37.2 C) (Oral)    Resp 18    Ht 5\' 8"  (1.727 m)    Wt 66.2 kg    SpO2 96%    BMI 22.19 kg/m  Pain Scale: 0-10 POSS *See Group Information*: S-Acceptable,Sleep, easy to arouse Pain Score: Asleep   SpO2: SpO2: 96 % O2 Device:SpO2: 96 % O2 Flow Rate: .O2 Flow Rate (L/min): 2 L/min  IO: Intake/output summary:  Intake/Output Summary (Last 24 hours) at 10/25/2021 1117 Last data filed at 10/25/2021 1000 Gross per 24 hour  Intake 625.67 ml  Output 500 ml  Net 125.67 ml    LBM: Last BM Date: 10/23/21 Baseline Weight: Weight: 72.6 kg Most recent weight: Weight: 66.2 kg       Time In: 10:30 Time Out: 11:15 Time Total: 45 min Greater than 50%  of this time was spent counseling and coordinating care related to the above assessment and plan.  Signed by: Asencion Gowda, NP   Please contact Palliative Medicine Team phone at [phone] for questions and concerns.  For individual provider: See Shea Evans

## 2021-10-25 NOTE — Progress Notes (Signed)
Physical Therapy Treatment Patient Details Name: Edward Singleton MRN: [id number] DOB: Nov 29, 1950 Today's Date: 10/25/2021   History of Present Illness Pt is a 71 y/o M admitted on 10/18/21 after being found down in his house. Pt reports he had been drinking too much & fell. Pt was confused, hypothermic & hypotensive. Pt is being treated for septic 2/2 strep bacteremia & hypovolemic shock. Pt found to have closed comminuted R proximal humerus fx with recommendations to use R shoulder sling now & planning for transfer to [facility] once medically stable for surgical evaluation by Dr. Marcelino Scot & Dr. Doreatha Martin. Per ortho conversation with Dr. Doreatha Martin but will require arthroplasty but will schedule this as an outpatient. PMH: chronic alcohol use    PT Comments    Pt received sitting in recliner, sister (Vickie) in room, agreeable to therapy. Time spent at the beginning of the session and again at the end to adjust sling. Ambulation initiated this session. He was able to ambulate short distances within room, CGA provided throughout session. O2 sats maintained at 95% or greater on 1L O2 via [place]. PT weaned to RA at rest prior to leaving - was able to maintain >92%; RN notified. Would like to progress ambulation distance and continue practicing bed mobility for increased independence. Would benefit from skilled PT to address above deficits and promote optimal return to PLOF.   Recommendations for follow up therapy are one component of a multi-disciplinary discharge planning process, led by the attending physician.  Recommendations may be updated based on patient status, additional functional criteria and insurance authorization.  Follow Up Recommendations  Skilled nursing-short term rehab (<3 hours/day)     Assistance Recommended at Discharge Intermittent Supervision/Assistance  Patient can return home with the following A lot of help with bathing/dressing/bathroom;Assist for transportation;Assistance with  cooking/housework;Direct supervision/assist for financial management;Help with stairs or ramp for entrance;Direct supervision/assist for medications management;A little help with walking and/or transfers   Equipment Recommendations  None recommended by PT (TBD at next venue)    Recommendations for Other Services       Precautions / Restrictions Precautions Precautions: Fall Required Braces or Orthoses: Sling Restrictions Weight Bearing Restrictions: Yes RUE Weight Bearing: Non weight bearing Other Position/Activity Restrictions: sling on at all times     Mobility  Bed Mobility               General bed mobility comments: not observed - pt in recliner    Transfers Overall transfer level: Needs assistance Equipment used: None Transfers: Sit to/from Stand Sit to Stand: Min guard           General transfer comment: CGA to steady; cues for sequence and safety    Ambulation/Gait Ambulation/Gait assistance: Min guard Gait Distance (Feet): 15 Feet Assistive device: None Gait Pattern/deviations: Step-through pattern;Decreased stride length Gait velocity: decreased     General Gait Details: 27ft x2 reps, CGA to steady. Short steps with forward posture. VC on safety as pt takes side-steps, etc rather than walking forward. Performed using 1L O2 via [place], O2 sats 95-96%.   Stairs             Wheelchair Mobility    Modified Rankin (Stroke Patients Only)       Balance Overall balance assessment: Needs assistance Sitting-balance support: Feet supported Sitting balance-Leahy Scale: Fair Sitting balance - Comments: sitting in recliner   Standing balance support: No upper extremity supported;During functional activity Standing balance-Leahy Scale: Fair Standing balance comment: minor tremor; CGA to  steady during standing and ambulation                            Cognition Arousal/Alertness: Awake/alert Behavior During Therapy: WFL for tasks  assessed/performed Overall Cognitive Status: Within Functional Limits for tasks assessed                                 General Comments: Agreeable, follows commands well.        Exercises General Exercises - Lower Extremity Long Arc Quad: AROM;Strengthening;Both;10 reps;Seated Hip Flexion/Marching: AROM;Strengthening;Both;10 reps;Seated    General Comments        Pertinent Vitals/Pain Pain Assessment: 0-10 Pain Score: 8  Pain Location: RUE Pain Descriptors / Indicators: Discomfort;Grimacing    Home Living                          Prior Function            PT Goals (current goals can now be found in the care plan section) Acute Rehab PT Goals Patient Stated Goal: get better PT Goal Formulation: With patient/family Time For Goal Achievement: 11/05/21 Potential to Achieve Goals: Good    Frequency    Min 2X/week      PT Plan      Co-evaluation              AM-PAC PT "6 Clicks" Mobility   Outcome Measure  Help needed turning from your back to your side while in a flat bed without using bedrails?: A Lot Help needed moving from lying on your back to sitting on the side of a flat bed without using bedrails?: A Lot Help needed moving to and from a bed to a chair (including a wheelchair)?: A Little Help needed standing up from a chair using your arms (e.g., wheelchair or bedside chair)?: A Little Help needed to walk in hospital room?: A Little Help needed climbing 3-5 steps with a railing? : A Lot 6 Click Score: 15    End of Session Equipment Utilized During Treatment: Gait belt;Oxygen Activity Tolerance: Patient tolerated treatment well Patient left: in chair;with chair alarm set;with call bell/phone within reach;with family/visitor present Nurse Communication: Mobility status (comm with MD regarding family questions) PT Visit Diagnosis: Unsteadiness on feet (R26.81);Muscle weakness (generalized) (M62.81);Difficulty in walking,  not elsewhere classified (R26.2);Pain Pain - Right/Left: Right Pain - part of body: Shoulder     Time: 1601-1620 PT Time Calculation (min) (ACUTE ONLY): 19 min  Charges:  $Therapeutic Exercise: 8-22 mins                     Patrina Levering PT, DPT 10/25/21 4:45 PM [phone]

## 2021-10-25 NOTE — Progress Notes (Addendum)
[facility]  ROUNDING NOTE   Subjective:   Patient resting comfortable Complains of right arm pain Swelling and bruising remain on Right arm  Creatinine stable  Objective:  Vital signs in last 24 hours:  Temp:  [97.8 F (36.6 C)-98.9 F (37.2 C)] 98.9 F (37.2 C) (01/16 0448) Pulse Rate:  [94-105] 99 (01/16 0827) Resp:  [18-20] 18 (01/16 0827) BP: (127-143)/(70-90) 127/90 (01/16 0827) SpO2:  [92 %-98 %] 96 % (01/16 0827)  Weight change:  Filed Weights   10/20/21 0500 10/21/21 0500 10/22/21 0500  Weight: 72.5 kg 66.3 kg 66.2 kg    Intake/Output: I/O last 3 completed shifts: In: 685.7 [P.O.:480; IV Piggyback:205.7] Out: 1300 [Urine:1300]   Intake/Output this shift:  Total I/O In: 120 [P.O.:120] Out: -   Physical Exam: General: NAD, laying in bed  Head: Normocephalic, atraumatic. Moist oral mucosal membranes  Eyes: Anicteric  Lungs:  Clear to auscultation, normal effort, 2L [place] O2  Heart: Regular rate and rhythm  Abdomen:  Soft, nontender  Extremities: no peripheral edema.  Neurologic: Nonfocal, moving all four extremities  Skin: RUE ecchymosis       Basic Metabolic Panel: Recent Labs  Lab 10/18/21 1800 10/18/21 2016 10/19/21 0504 10/19/21 1402 10/21/21 0534 10/22/21 0444 10/23/21 0516 10/24/21 0456 10/25/21 0422  NA 134* 134* 130*   < > 132* 134* 132* 133* 130*  K 3.5 3.5 2.8*   < > 3.2* 4.1 3.7 3.7 3.6  CL 91* 100 92*   < > 94* 100 96* 99 96*  CO2 9* 10* 23   < > 29 26 25 23 24   GLUCOSE 170* 192* 214*   < > 161* 129* 115* 120* 117*  BUN 64* 59* 59*   < > 27* 23 19 17 17   CREATININE 4.30* 3.68* 3.32*   < > 1.93* 1.91* 1.65* 1.67* 1.66*  CALCIUM 8.1* 6.7* 7.5*   < > 7.9* 8.4* 8.3* 8.6* 8.7*  MG 2.6*  --  1.7  --   --  1.6* 2.0  --   --   PHOS  --  7.6* 4.9*  --   --   --   --   --   --    < > = values in this interval not displayed.     Liver Function Tests: Recent Labs  Lab 10/18/21 1800 10/19/21 0504 10/20/21 0650  AST 37  46* 42*  ALT 19 21 23   ALKPHOS 100 83 73  BILITOT 1.0 1.0 1.1  PROT 6.2* 5.3* 5.1*  ALBUMIN 3.4* 3.0* 2.7*    Recent Labs  Lab 10/18/21 1800 10/19/21 0504  LIPASE 57* 112*    No results for input(s): AMMONIA in the last 168 hours.  CBC: Recent Labs  Lab 10/18/21 1800 10/19/21 0504 10/21/21 0534 10/22/21 0444 10/23/21 0516 10/24/21 0456 10/25/21 0422  WBC 24.3*   < > 18.6* 22.8* 19.5* 19.1* 16.6*  NEUTROABS 20.4*  --   --   --   --   --   --   HGB 11.3*   < > 7.2* 10.0* 10.0* 9.9* 10.3*  HCT 34.4*   < > 21.6* 29.3* 29.5* 28.8* 30.6*  MCV 104.9*   < > 100.0 96.7 97.0 96.3 98.1  PLT 275   < > 200 266 290 360 427*   < > = values in this interval not displayed.     Cardiac Enzymes: Recent Labs  Lab 10/18/21 1800 10/19/21 0504 10/20/21 0650 10/21/21 0534 10/22/21 0444  CKTOTAL 990* 2,341* 1,503* 861* 581*     BNP: Invalid input(s): POCBNP  CBG: Recent Labs  Lab 10/23/21 1711 10/24/21 0754 10/24/21 1045 10/24/21 1636 10/25/21 0823  GLUCAP 103* 94 134* 93 103*     Microbiology: Results for orders placed or performed during the hospital encounter of 10/18/21  Blood culture (routine x 2)     Status: Abnormal   Collection Time: 10/18/21  6:24 PM   Specimen: BLOOD  Result Value Ref Range Status   Specimen Description   Final    BLOOD FOOT Performed at [facility], [address], [place], [place] [address]    Special Requests   Final    BOTTLES DRAWN AEROBIC AND ANAEROBIC BCAV Performed at [facility], [address], [place], [place] [address]    Culture  Setup Time   Final    Organism ID to follow IN [place] CRITICAL RESULT CALLED TO, READ BACK BY AND VERIFIED WITH: NATHAN BEULE 10/19/21 0602 MW    Culture (A)  Final    STREPTOCOCCUS GALLOLYTICUS STAPHYLOCOCCUS EPIDERMIDIS THE SIGNIFICANCE OF ISOLATING THIS ORGANISM FROM A SINGLE SET OF BLOOD CULTURES WHEN MULTIPLE SETS ARE  DRAWN IS UNCERTAIN. PLEASE NOTIFY THE MICROBIOLOGY DEPARTMENT WITHIN ONE WEEK IF SPECIATION AND SENSITIVITIES ARE REQUIRED. CRITICAL RESULT CALLED TO, READ BACK BY AND VERIFIED WITH: [place]. [id number] FCP Performed at [facility], [address] [place], [place] [address]    Report Status 10/24/2021 FINAL  Final   Organism ID, Bacteria STREPTOCOCCUS GALLOLYTICUS  Final      Susceptibility   Streptococcus gallolyticus - MIC*    PENICILLIN 0.12 SENSITIVE Sensitive     CEFTRIAXONE <=0.12 SENSITIVE Sensitive     ERYTHROMYCIN <=0.12 SENSITIVE Sensitive     LEVOFLOXACIN 2 SENSITIVE Sensitive     VANCOMYCIN 0.5 SENSITIVE Sensitive     * STREPTOCOCCUS GALLOLYTICUS  Blood Culture ID Panel (Reflexed)     Status: Abnormal   Collection Time: 10/18/21  6:24 PM  Result Value Ref Range Status   Enterococcus faecalis NOT DETECTED NOT DETECTED Final   Enterococcus Faecium NOT DETECTED NOT DETECTED Final   Listeria monocytogenes NOT DETECTED NOT DETECTED Final   Staphylococcus species NOT DETECTED NOT DETECTED Final   Staphylococcus aureus (BCID) NOT DETECTED NOT DETECTED Final   Staphylococcus epidermidis NOT DETECTED NOT DETECTED Final   Staphylococcus lugdunensis NOT DETECTED NOT DETECTED Final   Streptococcus species DETECTED (A) NOT DETECTED Final    Comment: Not Enterococcus species, Streptococcus agalactiae, Streptococcus pyogenes, or Streptococcus pneumoniae. CRITICAL RESULT CALLED TO, READ BACK BY AND VERIFIED WITH: NATHAN BEULE 10/19/21 0602 MW    Streptococcus agalactiae NOT DETECTED NOT DETECTED Final   Streptococcus pneumoniae NOT DETECTED NOT DETECTED Final   Streptococcus pyogenes NOT DETECTED NOT DETECTED Final   A.calcoaceticus-baumannii NOT DETECTED NOT DETECTED Final   Bacteroides fragilis NOT DETECTED NOT DETECTED Final   Enterobacterales NOT DETECTED NOT DETECTED Final   Enterobacter cloacae complex NOT DETECTED NOT DETECTED Final   Escherichia coli NOT  DETECTED NOT DETECTED Final   Klebsiella aerogenes NOT DETECTED NOT DETECTED Final   Klebsiella oxytoca NOT DETECTED NOT DETECTED Final   Klebsiella pneumoniae NOT DETECTED NOT DETECTED Final   Proteus species NOT DETECTED NOT DETECTED Final   Salmonella species NOT DETECTED NOT DETECTED Final   Serratia marcescens NOT DETECTED NOT DETECTED Final   Haemophilus influenzae NOT DETECTED NOT DETECTED Final   Neisseria meningitidis  NOT DETECTED NOT DETECTED Final   Pseudomonas aeruginosa NOT DETECTED NOT DETECTED Final   Stenotrophomonas maltophilia NOT DETECTED NOT DETECTED Final   Candida albicans NOT DETECTED NOT DETECTED Final   Candida auris NOT DETECTED NOT DETECTED Final   Candida glabrata NOT DETECTED NOT DETECTED Final   Candida krusei NOT DETECTED NOT DETECTED Final   Candida parapsilosis NOT DETECTED NOT DETECTED Final   Candida tropicalis NOT DETECTED NOT DETECTED Final   Cryptococcus neoformans/gattii NOT DETECTED NOT DETECTED Final    Comment: Performed at [facility], [address] [place], [place] [address]  Resp Panel by RT-PCR (Flu A&B, Covid) Nasopharyngeal Swab     Status: None   Collection Time: 10/18/21  8:16 PM   Specimen: Nasopharyngeal Swab; Nasopharyngeal(NP) swabs in vial transport medium  Result Value Ref Range Status   SARS Coronavirus 2 by RT PCR NEGATIVE NEGATIVE Final    Comment: (NOTE) SARS-CoV-2 target nucleic acids are NOT DETECTED.  The SARS-CoV-2 RNA is generally detectable in upper respiratory specimens during the acute phase of infection. The lowest concentration of SARS-CoV-2 viral copies this assay can detect is 138 copies/mL. A negative result does not preclude SARS-Cov-2 infection and should not be used as the sole basis for treatment or other patient management decisions. A negative result may occur with  improper specimen collection/handling, submission of specimen other than nasopharyngeal swab, presence of viral mutation(s)  within the areas targeted by this assay, and inadequate number of viral copies(<138 copies/mL). A negative result must be combined with clinical observations, patient history, and epidemiological information. The expected result is Negative.  Fact Sheet for Patients:  EntrepreneurPulse.com.au  Fact Sheet for Healthcare Providers:  IncredibleEmployment.be  This test is no t yet approved or cleared by the [place] FDA and  has been authorized for detection and/or diagnosis of SARS-CoV-2 by FDA under an Emergency Use Authorization (EUA). This EUA will remain  in effect (meaning this test can be used) for the duration of the COVID-19 declaration under Section 564(b)(1) of the Act, 21 U.S.C.section 360bbb-3(b)(1), unless the authorization is terminated  or revoked sooner.       Influenza A by PCR NEGATIVE NEGATIVE Final   Influenza B by PCR NEGATIVE NEGATIVE Final    Comment: (NOTE) The Xpert Xpress SARS-CoV-2/FLU/RSV plus assay is intended as an aid in the diagnosis of influenza from Nasopharyngeal swab specimens and should not be used as a sole basis for treatment. Nasal washings and aspirates are unacceptable for Xpert Xpress SARS-CoV-2/FLU/RSV testing.  Fact Sheet for Patients: EntrepreneurPulse.com.au  Fact Sheet for Healthcare Providers: IncredibleEmployment.be  This test is not yet approved or cleared by the [place] FDA and has been authorized for detection and/or diagnosis of SARS-CoV-2 by FDA under an Emergency Use Authorization (EUA). This EUA will remain in effect (meaning this test can be used) for the duration of the COVID-19 declaration under Section 564(b)(1) of the Act, 21 U.S.C. section 360bbb-3(b)(1), unless the authorization is terminated or revoked.  Performed at [facility], [address] [place], [place] [address]   Culture, blood (Routine X 2) w Reflex to ID  Panel     Status: None   Collection Time: 10/18/21 11:28 PM   Specimen: BLOOD  Result Value Ref Range Status   Specimen Description BLOOD LEFT HAND  Final   Special Requests   Final    BOTTLES DRAWN AEROBIC ONLY Blood Culture adequate volume   Culture   Final    NO GROWTH  5 DAYS Performed at [facility], [address] [place], [place] [address]    Report Status 10/23/2021 FINAL  Final  MRSA Next Gen by PCR, Nasal     Status: None   Collection Time: 10/19/21  5:04 AM   Specimen: Nasal Mucosa; Nasal Swab  Result Value Ref Range Status   MRSA by PCR Next Gen NOT DETECTED NOT DETECTED Final    Comment: (NOTE) The GeneXpert MRSA Assay (FDA approved for NASAL specimens only), is one component of a comprehensive MRSA colonization surveillance program. It is not intended to diagnose MRSA infection nor to guide or monitor treatment for MRSA infections. Test performance is not FDA approved in patients less than 1 years old. Performed at [facility], [address] [place], [place] [address]   CULTURE, BLOOD (ROUTINE X 2) w Reflex to ID Panel     Status: None   Collection Time: 10/20/21  6:08 PM   Specimen: BLOOD  Result Value Ref Range Status   Specimen Description BLOOD LFOA  Final   Special Requests BOTTLES DRAWN AEROBIC AND ANAEROBIC BCAV  Final   Culture   Final    NO GROWTH 5 DAYS Performed at [facility], [address] [place], [place] [address]    Report Status 10/25/2021 FINAL  Final  CULTURE, BLOOD (ROUTINE X 2) w Reflex to ID Panel     Status: None   Collection Time: 10/20/21  6:23 PM   Specimen: BLOOD  Result Value Ref Range Status   Specimen Description BLOOD [facility]  Final   Special Requests BOTTLES DRAWN AEROBIC ONLY BCLV  Final   Culture   Final    NO GROWTH 5 DAYS Performed at [facility], [address] [place], [place] [address]    Report Status 10/25/2021 FINAL  Final    Coagulation Studies: No results for  input(s): LABPROT, INR in the last 72 hours.  Urinalysis: No results for input(s): COLORURINE, LABSPEC, PHURINE, GLUCOSEU, HGBUR, BILIRUBINUR, KETONESUR, PROTEINUR, UROBILINOGEN, NITRITE, LEUKOCYTESUR in the last 72 hours.  Invalid input(s): APPERANCEUR     Imaging: No results found.   Medications:    sodium chloride Stopped (10/19/21 0149)   sodium chloride     cefTRIAXone (ROCEPHIN)  IV 2 g (10/24/21 2011)    Chlorhexidine Gluconate Cloth  6 each Topical Z6010   folic acid  1 mg Oral Daily   insulin aspart  0-15 Units Subcutaneous TID WC   multivitamin with minerals  1 tablet Oral Daily   nicotine  14 mg Transdermal Daily   pantoprazole  40 mg Oral QHS   spiritus frumenti  1 each Oral BID AC   thiamine  100 mg Oral Daily   Or   thiamine  100 mg Intravenous Daily   sodium chloride, albuterol, docusate sodium, HYDROmorphone, oxyCODONE-acetaminophen, polyethylene glycol  Assessment/ Plan:  [name] is a 71 y.o. white male with alcohol abuse, diabetes mellitus type II, hypertension, gout, dementia, gout, hyperlipidemia who is admitted to [facility] on 10/18/2021 for Rhabdomyolysis [M62.82]  Acute Kidney Injury on chronic kidney disease stage IV with baseline creatinine 2.58 and GFR of 26 on 05/13/21.  Patient currently followed by nephrology at [facility]. Acute kidney injury secondary to rhabdomyolysis.  -Creatinine remained stable - Continue to encourage PO intake - no acute indication for dialysis  Rhabdomyolysis: Improving  Anemia with chronic kidney disease: status post PRBC transfusion on 1/12. Hemoglobin 10.3. Macrocytic on admission, but now normocytic.   Diabetes  mellitus type II with chronic kidney disease: noninsulin dependent. Hemoglobin A1c of 6.2%. Holding metformin.  Glucose stable  Hypertension with chronic kidney disease: 125/78. Holding home regimen of amlodipine and metoprolol.  Blood pressure 127/90.  Gout: no acute gout flare. Currently off allopurinol.     LOS: 7   1/16/202310:52 AM

## 2021-10-25 NOTE — Progress Notes (Signed)
ID Pt is doing much better Sitting in chair No fever Rt arm pain better Sister at bed side  O/e awake and alert Patient Vitals for the past 24 hrs:  BP Temp Temp src Pulse Resp SpO2  10/25/21 1800 120/70 (!) 97.4 F (36.3 C) Oral 90 16 91 %  10/25/21 1501 -- -- -- -- -- 91 %  10/25/21 1500 -- -- -- -- -- (!) 87 %  10/25/21 0827 127/90 98.3 F (36.8 C) Oral 99 18 96 %  10/25/21 0448 140/82 98.9 F (37.2 C) Oral (!) 105 20 96 %  10/24/21 1936 129/70 97.8 F (36.6 C) Oral 94 20 98 %    Rt arm ecchymosis improving Less Tender Chest b/l air entry HS- s1s2 Abd soft foley Labs CBC Latest Ref Rng & Units 10/25/2021 10/24/2021 10/23/2021  WBC 4.0 - 10.5 K/uL 16.6(H) 19.1(H) 19.5(H)  Hemoglobin 13.0 - 17.0 g/dL 10.3(L) 9.9(L) 10.0(L)  Hematocrit 39.0 - 52.0 % 30.6(L) 28.8(L) 29.5(L)  Platelets 150 - 400 K/uL 427(H) 360 290    CMP Latest Ref Rng & Units 10/25/2021 10/24/2021 10/23/2021  Glucose 70 - 99 mg/dL 117(H) 120(H) 115(H)  BUN 8 - 23 mg/dL 17 17 19   Creatinine 0.61 - 1.24 mg/dL 1.66(H) 1.67(H) 1.65(H)  Sodium 135 - 145 mmol/L 130(L) 133(L) 132(L)  Potassium 3.5 - 5.1 mmol/L 3.6 3.7 3.7  Chloride 98 - 111 mmol/L 96(L) 99 96(L)  CO2 22 - 32 mmol/L 24 23 25   Calcium 8.9 - 10.3 mg/dL 8.7(L) 8.6(L) 8.3(L)  Total Protein 6.5 - 8.1 g/dL - - -  Total Bilirubin 0.3 - 1.2 mg/dL - - -  Alkaline Phos 38 - 126 U/L - - -  AST 15 - 41 U/L - - -  ALT 0 - 44 U/L - - -    Micro 10/18/21  BC strep gallolyticus 1 of 2 sets 10/20/21 [facility] - sent   Impression/recommendation   Fall following alcohol consumption. Complicated fracture of the right humerus Severe bruising/ecchymosis of the right arm and shoulder with severe pain.  Watch for secondary infection.  Circulatory shock secondary to alcoholic ketosis, hypothermia and infection Received pressure support but now stable   Streptococcus Galloyticus bacteremia 1 of 2 sets CT scan done on 10/18/2021 showed displaced and comminuted fracture  of the proximal right humerus and comminuted fracture of the humeral head.  Diffuse subcutaneous edema of the right upper extremity and right axilla.  No fluid collection or large hematoma.  Repeat blood culture Neg 2 d echo is not a good study This bacteria can be associated with Colon malignancy and has the potential to cause endocarditis, but low bioburden and with staph epidermidis in the same culture contamination can be questioned  repeated  imaging of rt shoulder and arm and there was no abscess or collection-Pt on ceftriaxone- will change to cefazolin- will need antibiotic until 11/03/21- continue IV while in hospital can be switched to PO amoxicillin on discharge He had acute urinary retention and 1000c urine drained after placement of foley. Leucocytosis improving  Risk for pneumonia because of being bed bound and risk for aspiration- for incentive spirometry, oob, PT   History of splenectomy.  Rhabdomyolysis improving.   AKI on CKD.  Secondary to rhabdo, prerenal and infection..Improving    Diabetes mellitus on sliding scale   Discussed the management with patient and his sister

## 2021-10-26 ENCOUNTER — Inpatient Hospital Stay (HOSPITAL_COMMUNITY): Payer: No Typology Code available for payment source

## 2021-10-26 ENCOUNTER — Inpatient Hospital Stay (HOSPITAL_COMMUNITY)
Admission: AD | Admit: 2021-10-26 | Discharge: 2021-11-02 | DRG: 483 | Disposition: A | Discharge status: Skilled Nursing Facility | Payer: No Typology Code available for payment source | Source: Other Acute Inpatient Hospital | Attending: Internal Medicine | Admitting: Internal Medicine

## 2021-10-26 ENCOUNTER — Encounter (HOSPITAL_COMMUNITY): Payer: Self-pay | Admitting: Family Medicine

## 2021-10-26 DIAGNOSIS — E7800 Pure hypercholesterolemia, unspecified: Secondary | ICD-10-CM | POA: Diagnosis present

## 2021-10-26 DIAGNOSIS — Z9081 Acquired absence of spleen: Secondary | ICD-10-CM | POA: Diagnosis not present

## 2021-10-26 DIAGNOSIS — E871 Hypo-osmolality and hyponatremia: Secondary | ICD-10-CM | POA: Diagnosis present

## 2021-10-26 DIAGNOSIS — Z87442 Personal history of urinary calculi: Secondary | ICD-10-CM | POA: Diagnosis not present

## 2021-10-26 DIAGNOSIS — S42211A Unspecified displaced fracture of surgical neck of right humerus, initial encounter for closed fracture: Secondary | ICD-10-CM | POA: Diagnosis present

## 2021-10-26 DIAGNOSIS — F17210 Nicotine dependence, cigarettes, uncomplicated: Secondary | ICD-10-CM | POA: Diagnosis present

## 2021-10-26 DIAGNOSIS — Z01818 Encounter for other preprocedural examination: Secondary | ICD-10-CM

## 2021-10-26 DIAGNOSIS — B954 Other streptococcus as the cause of diseases classified elsewhere: Secondary | ICD-10-CM | POA: Diagnosis present

## 2021-10-26 DIAGNOSIS — R339 Retention of urine, unspecified: Secondary | ICD-10-CM | POA: Diagnosis present

## 2021-10-26 DIAGNOSIS — Z833 Family history of diabetes mellitus: Secondary | ICD-10-CM

## 2021-10-26 DIAGNOSIS — Z7982 Long term (current) use of aspirin: Secondary | ICD-10-CM

## 2021-10-26 DIAGNOSIS — E1151 Type 2 diabetes mellitus with diabetic peripheral angiopathy without gangrene: Secondary | ICD-10-CM | POA: Diagnosis present

## 2021-10-26 DIAGNOSIS — Z7984 Long term (current) use of oral hypoglycemic drugs: Secondary | ICD-10-CM

## 2021-10-26 DIAGNOSIS — F1027 Alcohol dependence with alcohol-induced persisting dementia: Secondary | ICD-10-CM | POA: Diagnosis present

## 2021-10-26 DIAGNOSIS — Z20822 Contact with and (suspected) exposure to covid-19: Secondary | ICD-10-CM | POA: Diagnosis present

## 2021-10-26 DIAGNOSIS — I25118 Atherosclerotic heart disease of native coronary artery with other forms of angina pectoris: Secondary | ICD-10-CM

## 2021-10-26 DIAGNOSIS — I2510 Atherosclerotic heart disease of native coronary artery without angina pectoris: Secondary | ICD-10-CM | POA: Diagnosis present

## 2021-10-26 DIAGNOSIS — Z79899 Other long term (current) drug therapy: Secondary | ICD-10-CM

## 2021-10-26 DIAGNOSIS — D649 Anemia, unspecified: Secondary | ICD-10-CM | POA: Diagnosis present

## 2021-10-26 DIAGNOSIS — E1122 Type 2 diabetes mellitus with diabetic chronic kidney disease: Secondary | ICD-10-CM | POA: Diagnosis present

## 2021-10-26 DIAGNOSIS — N1832 Chronic kidney disease, stage 3b: Secondary | ICD-10-CM

## 2021-10-26 DIAGNOSIS — F1010 Alcohol abuse, uncomplicated: Secondary | ICD-10-CM | POA: Diagnosis present

## 2021-10-26 DIAGNOSIS — I129 Hypertensive chronic kidney disease with stage 1 through stage 4 chronic kidney disease, or unspecified chronic kidney disease: Secondary | ICD-10-CM | POA: Diagnosis present

## 2021-10-26 DIAGNOSIS — W19XXXA Unspecified fall, initial encounter: Secondary | ICD-10-CM | POA: Diagnosis present

## 2021-10-26 DIAGNOSIS — M6282 Rhabdomyolysis: Secondary | ICD-10-CM | POA: Diagnosis present

## 2021-10-26 DIAGNOSIS — S42201A Unspecified fracture of upper end of right humerus, initial encounter for closed fracture: Secondary | ICD-10-CM

## 2021-10-26 DIAGNOSIS — T796XXA Traumatic ischemia of muscle, initial encounter: Secondary | ICD-10-CM | POA: Diagnosis not present

## 2021-10-26 DIAGNOSIS — N179 Acute kidney failure, unspecified: Secondary | ICD-10-CM | POA: Diagnosis present

## 2021-10-26 DIAGNOSIS — E876 Hypokalemia: Secondary | ICD-10-CM | POA: Diagnosis present

## 2021-10-26 DIAGNOSIS — R7881 Bacteremia: Secondary | ICD-10-CM

## 2021-10-26 DIAGNOSIS — Z8249 Family history of ischemic heart disease and other diseases of the circulatory system: Secondary | ICD-10-CM | POA: Diagnosis not present

## 2021-10-26 DIAGNOSIS — B955 Unspecified streptococcus as the cause of diseases classified elsewhere: Secondary | ICD-10-CM | POA: Diagnosis present

## 2021-10-26 DIAGNOSIS — S42351A Displaced comminuted fracture of shaft of humerus, right arm, initial encounter for closed fracture: Secondary | ICD-10-CM | POA: Diagnosis present

## 2021-10-26 DIAGNOSIS — M109 Gout, unspecified: Secondary | ICD-10-CM | POA: Diagnosis present

## 2021-10-26 DIAGNOSIS — Z09 Encounter for follow-up examination after completed treatment for conditions other than malignant neoplasm: Secondary | ICD-10-CM

## 2021-10-26 DIAGNOSIS — E119 Type 2 diabetes mellitus without complications: Secondary | ICD-10-CM

## 2021-10-26 DIAGNOSIS — E1165 Type 2 diabetes mellitus with hyperglycemia: Secondary | ICD-10-CM | POA: Diagnosis present

## 2021-10-26 DIAGNOSIS — Z7189 Other specified counseling: Secondary | ICD-10-CM | POA: Diagnosis not present

## 2021-10-26 DIAGNOSIS — R338 Other retention of urine: Secondary | ICD-10-CM | POA: Diagnosis present

## 2021-10-26 DIAGNOSIS — I739 Peripheral vascular disease, unspecified: Secondary | ICD-10-CM | POA: Diagnosis present

## 2021-10-26 DIAGNOSIS — S42201K Unspecified fracture of upper end of right humerus, subsequent encounter for fracture with nonunion: Secondary | ICD-10-CM

## 2021-10-26 DIAGNOSIS — D62 Acute posthemorrhagic anemia: Secondary | ICD-10-CM | POA: Diagnosis not present

## 2021-10-26 LAB — BASIC METABOLIC PANEL
Anion gap: 12 (ref 5–15)
BUN: 18 mg/dL (ref 8–23)
CO2: 24 mmol/L (ref 22–32)
Calcium: 8.5 mg/dL — ABNORMAL LOW (ref 8.9–10.3)
Chloride: 95 mmol/L — ABNORMAL LOW (ref 98–111)
Creatinine, Ser: 1.69 mg/dL — ABNORMAL HIGH (ref 0.61–1.24)
GFR, Estimated: 43 mL/min — ABNORMAL LOW (ref >60)
Glucose, Bld: 106 mg/dL — ABNORMAL HIGH (ref 70–99)
Potassium: 3.3 mmol/L — ABNORMAL LOW (ref 3.5–5.1)
Sodium: 131 mmol/L — ABNORMAL LOW (ref 135–145)

## 2021-10-26 LAB — CBC
HCT: 31.1 % — ABNORMAL LOW (ref 39.0–52.0)
Hemoglobin: 10.4 g/dL — ABNORMAL LOW (ref 13.0–17.0)
MCH: 32.9 pg (ref 26.0–34.0)
MCHC: 33.4 g/dL (ref 30.0–36.0)
MCV: 98.4 fL (ref 80.0–100.0)
Platelets: 552 10*3/uL — ABNORMAL HIGH (ref 150–400)
RBC: 3.16 MIL/uL — ABNORMAL LOW (ref 4.22–5.81)
RDW: 18.5 % — ABNORMAL HIGH (ref 11.5–15.5)
WBC: 12.9 10*3/uL — ABNORMAL HIGH (ref 4.0–10.5)
nRBC: 0.5 % — ABNORMAL HIGH (ref 0.0–0.2)

## 2021-10-26 LAB — GLUCOSE, CAPILLARY
Glucose-Capillary: 125 mg/dL — ABNORMAL HIGH (ref 70–99)
Glucose-Capillary: 111 mg/dL — ABNORMAL HIGH (ref 70–99)
Glucose-Capillary: 124 mg/dL — ABNORMAL HIGH (ref 70–99)

## 2021-10-26 MED ORDER — SODIUM CHLORIDE 0.9 % IV SOLN: INTRAVENOUS | Status: AC

## 2021-10-26 MED ORDER — INSULIN ASPART 100 UNIT/ML IJ SOLN
0.0000 [IU] | INTRAMUSCULAR | Status: DC
  Administered 2021-10-27 – 2021-11-01 (×9): 1 [IU] via SUBCUTANEOUS

## 2021-10-26 MED ORDER — ACETAMINOPHEN 325 MG PO TABS: 650.0000 mg | ORAL_TABLET | Freq: Four times a day (QID) | ORAL | Status: DC | PRN

## 2021-10-26 MED ORDER — FOLIC ACID 1 MG PO TABS
1.0000 mg | ORAL_TABLET | Freq: Every day | ORAL | Status: DC
  Administered 2021-10-27 – 2021-11-02 (×7): 1 mg via ORAL
  Filled 2021-10-26 (×7): qty 1

## 2021-10-26 MED ORDER — CEFAZOLIN SODIUM-DEXTROSE 2-4 GM/100ML-% IV SOLN
2.0000 g | Freq: Three times a day (TID) | INTRAVENOUS | Status: DC
  Administered 2021-10-27 – 2021-11-02 (×20): 2 g via INTRAVENOUS
  Filled 2021-10-26 (×20): qty 100

## 2021-10-26 MED ORDER — POTASSIUM CHLORIDE CRYS ER 20 MEQ PO TBCR
40.0000 meq | EXTENDED_RELEASE_TABLET | Freq: Once | ORAL | Status: AC
  Administered 2021-10-26: 40 meq via ORAL
  Filled 2021-10-26: qty 2

## 2021-10-26 MED ORDER — SPIRITUS FRUMENTI
1.0000 | Freq: Every day | ORAL | Status: DC
  Administered 2021-10-26: 1 via ORAL
  Filled 2021-10-26 (×2): qty 1

## 2021-10-26 MED ORDER — ACETAMINOPHEN 650 MG RE SUPP: 650.0000 mg | Freq: Four times a day (QID) | RECTAL | Status: DC | PRN

## 2021-10-26 MED ORDER — ROSUVASTATIN CALCIUM 20 MG PO TABS
20.0000 mg | ORAL_TABLET | Freq: Every day | ORAL | Status: DC
  Administered 2021-10-27 – 2021-11-02 (×7): 20 mg via ORAL
  Filled 2021-10-26 (×7): qty 1

## 2021-10-26 MED ORDER — THIAMINE HCL 100 MG PO TABS
100.0000 mg | ORAL_TABLET | Freq: Every day | ORAL | Status: DC
  Administered 2021-10-27 – 2021-11-02 (×7): 100 mg via ORAL
  Filled 2021-10-26 (×7): qty 1

## 2021-10-26 MED ORDER — FENTANYL CITRATE PF 50 MCG/ML IJ SOSY
25.0000 ug | PREFILLED_SYRINGE | INTRAMUSCULAR | Status: DC | PRN
  Administered 2021-10-27 (×3): 25 ug via INTRAVENOUS
  Filled 2021-10-26 (×3): qty 1

## 2021-10-26 NOTE — Discharge Summary (Addendum)
Physician Discharge Summary  JEMAR PAULSEN ONG:[id number] DOB: 12/23/1950 DOA: 10/18/2021  PCP: Windy Fast, MD  Admit date: 10/18/2021 Discharge date: 10/26/2021  Admitted From: Home Disposition:  [facility]   Discharge Condition: Stable CODE STATUS: Full  Diet recommendation: Heart healthy   Brief/Interim Summary: Edward Singleton is a 71 year old male with long history of chronic alcohol use was reportedly found down in his house.  He was last seen well several days ago.  The patient bedside is alert and responsive but confused as to the timetable of the last few days.  He reports that he had been drinking too much alcohol and fell.  He believes he fell on Sunday but is unclear as to what happened or the timing of the event.  Police were called to the home and had to break down the door finding him on the floor with significant bruising lying on his right arm.  Per ED/EMS report the patient was confused, hypothermic and hypotensive receiving IV fluids.   Per Tamela Oddi, who is healthcare power of attorney, the patient has been binge drinking since the summer.  Upon arrival in the ER patient complaining of cold and thirst, became nauseous with some vomiting episodes.  He received warmed IV fluid, empiric antibiotics.  On-call orthopedist was consulted with additional imaging ordered and instructions to place right arm in a sling.  After aggressive IV fluid resuscitation patient requiring vasopressor support.  He was weaned off levophed on 1/10 and transferred to [facility] service 1/11.   Patient was seen by infectious disease and was maintained on ceftriaxone.  Rhabdomyolysis as well as kidney injury improved.  He was medically stable for surgical fixation of his right proximal humerus fracture.  Orthopedic surgery Dr. Mack Guise discussed with Dr. Griffin Basil at [facility] for transfer and to go to the [place] on 1/18.  Discharge Diagnoses:  Principal Problem:   Rhabdomyolysis Active  Problems:   AKI (acute kidney injury) ([place])   Metabolic acidosis   Shock ([place])   Closed comminuted right proximal humerus fracture -Orthopedic surgery consulted, Dr. Mack Guise discussed with Dr. Griffin Basil for surgery at [name] on 10/27/21  -NPO at midnight. Please hold all anticoagulants in preparation for surgery 1/18 -Pain control   Septic and hypovolemic shock Sepsis secondary to strep bacteremia  -Now off levophed  -Blood culture showing strep gallolyticus, starph epidermidis 1/9 -Repeat blood culture negative 1/11 -CT right upper extremity completed which revealed reactive edema versus cellulitis, no drainable fluid collection. -Infectious disease consulted, recommended cefazolin (or amoxicillin if discharged) until 11/03/21 - see note by Dr. Delaine Lame 1/16  -Antihypertensives including home norvasc, toprol have been on hold due to septic shock   Rhabdomyolysis -CK improved   Alcohol abuse with risk for DTs -Receiving alcohol TID --> deescalated to BID --> QD  -Monitor for DT   AKI on CKD stage IV -Baseline creatinine 2.6 -Nephrology consulted -Resolved    Acute urinary retention -Foley catheter placed 1/13 -He will need voiding trial prior to discharge   Symptomatic anemia, acute blood loss anemia  -Likely some blood loss component into hematoma of the right upper extremity -Hemoglobin 11.3 >> 8.6 >> 7.6 >> 7.2  -Transfuse 1 unit packed red blood cell in setting of hypoxemia and anemia -Chest x-ray did not reveal pulmonary edema -Change subq hep to SCDs   Acute hypoxic respiratory failure -Now weaned to room air    Tobacco abuse -Nicotine patch   Hypokalemia -Replaced 1/17  Goals of  care  -HCPOA/sister [name] requested goals of care conversation and palliative care consult which was placed   Discharge Instructions  Discharge Instructions     No wound care   Complete by: As directed       Allergies as of 10/26/2021   No Known Allergies       Medication List     STOP taking these medications    allopurinol 300 MG tablet Commonly known as: ZYLOPRIM   amLODipine 5 MG tablet Commonly known as: NORVASC   aspirin 81 MG chewable tablet   escitalopram 10 MG tablet Commonly known as: LEXAPRO   metoprolol succinate 50 MG 24 hr tablet Commonly known as: TOPROL-XL   traZODone 50 MG tablet Commonly known as: DESYREL       TAKE these medications    folic acid 1 MG tablet Commonly known as: FOLVITE Take 1 mg by mouth daily.   metFORMIN 500 MG tablet Commonly known as: GLUCOPHAGE Take 500 mg by mouth 2 (two) times daily.   rosuvastatin 20 MG tablet Commonly known as: CRESTOR Take 20 mg by mouth daily.   thiamine 100 MG tablet Commonly known as: Vitamin B-1 Take 100 mg by mouth daily.        No Known Allergies    Procedures/Studies: CT ABDOMEN PELVIS WO CONTRAST  Result Date: 10/18/2021 CLINICAL DATA:  Abdominal pain, acute, nonlocalized. Found on floor. EXAM: CT ABDOMEN AND PELVIS WITHOUT CONTRAST TECHNIQUE: Multidetector CT imaging of the abdomen and pelvis was performed following the standard protocol without IV contrast. COMPARISON:  05/11/2015 FINDINGS: Lower chest: Small to moderate-sized hiatal hernia. Fluid in the distal esophagus may reflect reflux. No effusions. Coronary artery and aortic calcifications. Hepatobiliary: No focal hepatic abnormality. Gallbladder unremarkable. Pancreas: No focal abnormality or ductal dilatation. Spleen: Prior splenectomy Adrenals/Urinary Tract: Left kidney is atrophic. 5.3 cm cyst off the midpole of the left kidney. Bilateral nephrolithiasis. No hydronephrosis. Small scattered low-density lesions in the right kidney likely reflects cysts. Adrenal glands unremarkable. Foley catheter in the bladder which is decompressed. Stomach/Bowel: Sigmoid diverticulosis. No active diverticulitis. Mild distention of the stomach. Small bowel is decompressed. Vascular/Lymphatic: Heavily  calcified aorta and branch vessels. No aneurysm or adenopathy. Reproductive: No visible focal abnormality. Other: No free fluid or free air. Musculoskeletal: Old healed fractures of the left superior and inferior pubic rami. No acute bony abnormality. IMPRESSION: Bilateral nephrolithiasis.  No hydronephrosis. Small to moderate-sized hiatal hernia. Fluid in the distal esophagus may be related to reflux or dysmotility. Sigmoid diverticulosis.  No active diverticulitis. Heavily calcified aorta. Electronically Signed   By: Rolm Baptise M.D.   On: 10/18/2021 20:26   DG Chest 1 View  Result Date: 10/20/2021 CLINICAL DATA:  Shortness of breath EXAM: CHEST  1 VIEW COMPARISON:  10/19/2021 FINDINGS: Right IJ line with tip at the SVC. Generous lung volumes with mild interstitial coarsening attributed to COPD. Normal heart size and mediastinal contours. There is no edema, consolidation, effusion, or pneumothorax. IMPRESSION: No active disease. Electronically Signed   By: Jorje Guild M.D.   On: 10/20/2021 04:02   CT HEAD WO CONTRAST (5MM)  Result Date: 10/18/2021 CLINICAL DATA:  Head trauma, minor (Age >= 65y).  Found on floor. EXAM: CT HEAD WITHOUT CONTRAST TECHNIQUE: Contiguous axial images were obtained from the base of the skull through the vertex without intravenous contrast. 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 There is atrophy and chronic small vessel disease changes. No  acute intracranial abnormality. Specifically, no hemorrhage, hydrocephalus, mass lesion, acute infarction, or significant intracranial injury. Vascular: No hyperdense vessel or unexpected calcification. Skull: No acute calvarial abnormality. 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 Atrophy, chronic microvascular disease. No acute intracranial abnormality. Electronically Signed   By: Rolm Baptise M.D.   On: 10/18/2021 20:18    CT Cervical Spine Wo Contrast  Result Date: 10/18/2021 CLINICAL DATA:  Neck trauma (Age >= 65y).  Found on floor. EXAM: CT CERVICAL SPINE WITHOUT CONTRAST TECHNIQUE: Multidetector CT imaging of the cervical spine was performed without intravenous contrast. Multiplanar CT image reconstructions were also generated. COMPARISON:  03/14/2019 FINDINGS: Alignment: No subluxation. Skull base and vertebrae: No acute fracture. No primary bone lesion or focal pathologic process. Soft tissues and spinal canal: No prevertebral fluid or swelling. No visible canal hematoma. Disc levels: Degenerative disc disease at C5-6 and C6-7 with disc space narrowing and spurring. Mild to moderate degenerative facet disease bilaterally, right greater than left. Upper chest: No acute findings Other: None IMPRESSION: No acute bony abnormality. Electronically Signed   By: Rolm Baptise M.D.   On: 10/18/2021 20:20   CT HUMERUS RIGHT WO CONTRAST  Result Date: 10/22/2021 CLINICAL DATA:  Chronic alcohol abuse.  Shoulder trauma. EXAM: CT OF THE RIGHT HUMERUS WITHOUT CONTRAST CT OF THE RIGHT FOREARM WITHOUT CONTRAST TECHNIQUE: Multidetector CT imaging was performed of the right humerus according to the standard protocol. Multiplanar CT image reconstructions were also generated. Multidetector CT imaging was performed of the right forearm according to the standard protocol. Multiplanar CT image reconstructions were also generated. RADIATION DOSE REDUCTION: This exam was performed according to the departmental dose-optimization program which includes automated exposure control, adjustment of the mA and/or kV according to patient size and/or use of iterative reconstruction technique. COMPARISON:  None. FINDINGS: Bones/Joint/Cartilage Generalized osteopenia. Severely comminuted and displaced fracture of the surgical neck of right proximal humerus. 4 cm of lateral displacement, 2 cm of superior displacement. Numerous fracture fragments are located  along the anterior humeral head. Severely comminuted fracture of the greater tuberosity and lesser tuberosity. Fracture cleft extends to the superior articular surface. No glenohumeral dislocation. Mild arthropathy of the acromioclavicular joint. No acute fracture or dislocation of the radius and ulna. No elbow fracture or dislocation. 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 Ligaments Ligaments are suboptimally evaluated by CT. Muscles and Tendons Focal area of atrophy in the biceps muscle likely reflecting prior injury. Soft tissue edema in the biceps muscle concerning for muscle strain. Soft tissue No fluid collection or hematoma. No soft tissue mass. Severe soft tissue edema throughout the upper arm and forearm with skin thickening which may reflect reactive edema versus cellulitis. No drainable fluid collection. IMPRESSION: 1. Severely comminuted and displaced fracture of the surgical neck of right proximal humerus. 4 cm of lateral displacement, 2 cm of superior displacement. Numerous fracture fragments are located along the anterior humeral head. Severely comminuted fracture of the greater tuberosity and lesser tuberosity. Fracture cleft extends to the superior articular surface. 2.  No acute osseous injury of the right forearm. 3. 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 Electronically Signed   By: Kathreen Devoid M.D.   On: 10/22/2021 11:57   CT SHOULDER RIGHT WO CONTRAST  Result Date: 10/18/2021 CLINICAL DATA:  Proximal humeral fracture. EXAM: CT OF THE UPPER RIGHT EXTREMITY WITHOUT CONTRAST TECHNIQUE: Multidetector CT imaging of the upper right extremity was performed according to the standard protocol. COMPARISON:  Radiograph dated 10/18/2021. FINDINGS: Bones/Joint/Cartilage There is a displaced and comminuted fracture of the proximal right humerus involving the humeral neck and proximal humeral diaphysis. Multiple displaced fracture  fragments noted. There is proximal migration of the humeral shaft in relation to the head of the humerus. There is lateral positioning of the humeral diaphysis in relation to the head of the humerus. There is comminuted fractures of the humeral head. The bones are osteopenic. There is no dislocation. Ligaments Suboptimally assessed by CT. Muscles and Tendons There is intramuscular edema. No fluid collection or large hematoma. Soft tissues Diffuse subcutaneous edema of the right upper extremity and right axilla. No fluid collection or large hematoma. IMPRESSION: 1. Displaced and comminuted fracture of the proximal right humerus involving the humeral neck and proximal humeral diaphysis. 2. Comminuted fracture of the humeral head. Electronically Signed   By: Anner Crete M.D.   On: 10/18/2021 23:03   CT FOREARM RIGHT WO CONTRAST  Result Date: 10/22/2021 CLINICAL DATA:  Chronic alcohol abuse.  Shoulder trauma. EXAM: CT OF THE RIGHT HUMERUS WITHOUT CONTRAST CT OF THE RIGHT FOREARM WITHOUT CONTRAST TECHNIQUE: Multidetector CT imaging was performed of the right humerus according to the standard protocol. Multiplanar CT image reconstructions were also generated. Multidetector CT imaging was performed of the right forearm according to the standard protocol. Multiplanar CT image reconstructions were also generated. RADIATION DOSE REDUCTION: This exam was performed according to the departmental dose-optimization program which includes automated exposure control, adjustment of the mA and/or kV according to patient size and/or use of iterative reconstruction technique. COMPARISON:  None. FINDINGS: Bones/Joint/Cartilage Generalized osteopenia. Severely comminuted and displaced fracture of the surgical neck of right proximal humerus. 4 cm of lateral displacement, 2 cm of superior displacement. Numerous fracture fragments are located along the anterior humeral head. Severely comminuted fracture of the greater tuberosity and  lesser tuberosity. Fracture cleft extends to the superior articular surface. No glenohumeral dislocation. Mild arthropathy of the acromioclavicular joint. No acute fracture or dislocation of the radius and ulna. No elbow fracture or dislocation. 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 Ligaments Ligaments are suboptimally evaluated by CT. Muscles and Tendons Focal area of atrophy in the biceps muscle likely reflecting prior injury. Soft tissue edema in the biceps muscle concerning for muscle strain. Soft tissue No fluid collection or hematoma. No soft tissue mass. Severe soft tissue edema throughout the upper arm and forearm with skin thickening which may reflect reactive edema versus cellulitis. No drainable fluid collection. IMPRESSION: 1. Severely comminuted and displaced fracture of the surgical neck of right proximal humerus. 4 cm of lateral displacement, 2 cm of superior displacement. Numerous fracture fragments are located along the anterior humeral head. Severely comminuted fracture of the greater tuberosity and lesser tuberosity. Fracture cleft extends to the superior articular surface. 2.  No acute osseous injury of the right forearm. 3. 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 Electronically Signed   By: Kathreen Devoid M.D.   On: 10/22/2021 11:57   DG Chest Port 1 View  Result Date: 10/19/2021 CLINICAL DATA:  Central line placement EXAM: PORTABLE CHEST 1 VIEW COMPARISON:  10/18/2021 FINDINGS: Right central line tip in the SVC. No pneumothorax. Heart and mediastinal contours are within normal limits. No focal opacities or effusions. No acute bony abnormality. IMPRESSION: Right central line tip in the SVC.  No pneumothorax. No active cardiopulmonary disease. Electronically Signed   By: Rolm Baptise M.D.   On: 10/19/2021 01:20   DG Chest  Portable 1 View  Result Date: 10/18/2021 CLINICAL DATA:  Shortness of breath.  Right upper arm  bruising EXAM: PORTABLE CHEST 1 VIEW COMPARISON:  03/15/2019 FINDINGS: Heart size within normal limits. Atherosclerotic calcification of the aortic knob. No focal airspace consolidation, pleural effusion, or pneumothorax. Chronic left-sided rib fractures. Remote ununited left clavicular fracture. Acute comminuted, displaced, and angulated proximal right humeral fracture. IMPRESSION: 1. No active cardiopulmonary disease. 2. Acute comminuted, displaced, and angulated proximal right humeral fracture. Electronically Signed   By: Davina Poke D.O.   On: 10/18/2021 18:20   DG Humerus Right  Result Date: 10/18/2021 CLINICAL DATA:  Right upper extremity bruising.  Found down. EXAM: RIGHT HUMERUS - 2+ VIEW COMPARISON:  None. FINDINGS: Acute comminuted fracture of the proximal right humerus with approximately 90 degrees of varus angulation. Dominant butterfly fragment measuring up to 7.5 cm which is medially displaced relative to the humeral shaft. Humeral head appears to be in articulation with the glenoid without evidence of dislocation. AC joint intact. Degenerative changes of the elbow. Prominent soft tissue swelling the fracture site. IMPRESSION: Acute comminuted, displaced, and angulated fracture of the proximal right humerus as described. Electronically Signed   By: Davina Poke D.O.   On: 10/18/2021 18:22   ECHOCARDIOGRAM COMPLETE  Result Date: 10/19/2021    ECHOCARDIOGRAM REPORT   Patient Name:   Edward Singleton Date of Exam: 10/19/2021 Medical Rec #:  [id number]       Height:       68.0 in Accession #:    [id number]      Weight:       160.0 lb Date of Birth:  16-Oct-1950        BSA:          1.859 m Patient Age:    61 years        BP:           121/62 mmHg Patient Gender: M               HR:           80 bpm. Exam Location:  [facility] Procedure: 2D Echo, Color Doppler and Cardiac Doppler Indications:     I42.9 Cardiomyopathy-unspecified  History:         Patient has no prior history of Echocardiogram  examinations.                  Risk Factors:Hypertension, Diabetes, HCL and Current Smoker. Hx                  of alcohol abuse.  Sonographer:     Charmayne Sheer Referring Phys:  [id number] BRITTON L R[place]-CHESTER Diagnosing Phys: Harrell Gave End MD  Sonographer Comments: Technically difficult study due to poor echo windows and no apical window. IMPRESSIONS  1. Left ventricular ejection fraction, by estimation, is >55%. The left ventricle has normal function. Left ventricular endocardial border not optimally defined to evaluate regional wall motion. Left ventricular diastolic parameters are indeterminate.  2. Right ventricular systolic function is normal. The right ventricular size is normal. Tricuspid regurgitation signal is inadequate for assessing PA pressure.  3. The mitral valve was not well visualized. Unable to accurately assess mitral valve regurgitation.  4. The aortic valve is tricuspid. There is mild calcification of the aortic valve. There is mild thickening of the aortic valve. Aortic valve regurgitation not well assessed. Aortic valve gradient could not be assessed.  5. The inferior vena cava is normal in size with <50%  respiratory variability, suggesting right atrial pressure of 8 mmHg. FINDINGS  Left Ventricle: Left ventricular ejection fraction, by estimation, is >55%. The left ventricle has normal function. Left ventricular endocardial border not optimally defined to evaluate regional wall motion. The left ventricular internal cavity size was  normal in size. There is no left ventricular hypertrophy. Left ventricular diastolic parameters are indeterminate. Right Ventricle: The right ventricular size is normal. No increase in right ventricular wall thickness. Right ventricular systolic function is normal. Tricuspid regurgitation signal is inadequate for assessing PA pressure. Left Atrium: Left atrial size was not well visualized. Right Atrium: Right atrial size was not well visualized. Pericardium: There  is no evidence of pericardial effusion. Mitral Valve: The mitral valve was not well visualized. Mild mitral annular calcification. Unable to accurately assess mitral valve regurgitation. Tricuspid Valve: The tricuspid valve is not well visualized. Tricuspid valve regurgitation is trivial. Aortic Valve: The aortic valve is tricuspid. There is mild calcification of the aortic valve. There is mild thickening of the aortic valve. Aortic valve regurgitation not well assessed. Aortic valve gradient could not be assessed. Pulmonic Valve: The pulmonic valve was not well visualized. Pulmonic valve regurgitation is not visualized. No evidence of pulmonic stenosis. Aorta: The aortic root is normal in size and structure. Pulmonary Artery: The pulmonary artery is not well seen. Venous: The inferior vena cava is normal in size with less than 50% respiratory variability, suggesting right atrial pressure of 8 mmHg. IAS/Shunts: No atrial level shunt detected by color flow Doppler.  LEFT VENTRICLE PLAX 2D LVIDd:         3.83 cm LVIDs:         2.75 cm LV PW:         0.88 cm LV IVS:        0.73 cm LVOT diam:     2.40 cm LVOT Area:     4.52 cm  LEFT ATRIUM         Index LA diam:    3.80 cm 2.04 cm/m                        PULMONIC VALVE AORTA                 PV Vmax:       1.09 m/s Ao Root diam: 3.70 cm PV Vmean:      72.300 cm/s                       PV VTI:        0.185 m                       PV Peak grad:  4.8 mmHg                       PV Mean grad:  2.0 mmHg   SHUNTS Systemic Diam: 2.40 cm Nelva Bush MD Electronically signed by Nelva Bush MD Signature Date/Time: 10/19/2021/3:40:12 PM    Final        Discharge Exam: Vitals:   10/26/21 0415 10/26/21 0835  BP: (!) 141/69 139/79  Pulse: 97 94  Resp: 16 18  Temp: 97.9 F (36.6 C) 97.6 F (36.4 C)  SpO2: 94% 93%    General: Pt is alert, awake, not in acute distress Cardiovascular: RRR, S1/S2 +, no edema Respiratory: CTA bilaterally, no wheezing, no rhonchi,  no respiratory distress, no conversational  dyspnea  Abdominal: Soft, NT, ND, bowel sounds + Extremities: RUE with ecchymosis, swelling  Psych: Normal mood and affect, stable judgement and insight     The results of significant diagnostics from this hospitalization (including imaging, microbiology, ancillary and laboratory) are listed below for reference.     Microbiology: Recent Results (from the past 240 hour(s))  Blood culture (routine x 2)     Status: Abnormal   Collection Time: 10/18/21  6:24 PM   Specimen: BLOOD  Result Value Ref Range Status   Specimen Description   Final    BLOOD FOOT Performed at [facility], [address], [place], [place] [address]    Special Requests   Final    BOTTLES DRAWN AEROBIC AND ANAEROBIC BCAV Performed at [facility], [address] [place], [place] [address]    Culture  Setup Time   Final    Organism ID to follow IN BOTH AEROBIC AND ANAEROBIC BOTTLES GRAM POSITIVE COCCI CRITICAL RESULT CALLED TO, READ BACK BY AND VERIFIED WITH: NATHAN BEULE 10/19/21 0602 MW    Culture (A)  Final    STREPTOCOCCUS GALLOLYTICUS STAPHYLOCOCCUS EPIDERMIDIS THE SIGNIFICANCE OF ISOLATING THIS ORGANISM FROM A SINGLE SET OF BLOOD CULTURES WHEN MULTIPLE SETS ARE DRAWN IS UNCERTAIN. PLEASE NOTIFY THE MICROBIOLOGY DEPARTMENT WITHIN ONE WEEK IF SPECIATION AND SENSITIVITIES ARE REQUIRED. CRITICAL RESULT CALLED TO, READ BACK BY AND VERIFIED WITH: Farley. [phone] FCP Performed at [facility], [address], [place], [place] [address]    Report Status 10/24/2021 FINAL  Final   Organism ID, Bacteria STREPTOCOCCUS GALLOLYTICUS  Final      Susceptibility   Streptococcus gallolyticus - MIC*    PENICILLIN 0.12 SENSITIVE Sensitive     CEFTRIAXONE <=0.12 SENSITIVE Sensitive     ERYTHROMYCIN <=0.12 SENSITIVE Sensitive     LEVOFLOXACIN 2 SENSITIVE Sensitive     VANCOMYCIN 0.5 SENSITIVE Sensitive     * STREPTOCOCCUS GALLOLYTICUS   Blood Culture ID Panel (Reflexed)     Status: Abnormal   Collection Time: 10/18/21  6:24 PM  Result Value Ref Range Status   Enterococcus faecalis NOT DETECTED NOT DETECTED Final   Enterococcus Faecium NOT DETECTED NOT DETECTED Final   Listeria monocytogenes NOT DETECTED NOT DETECTED Final   Staphylococcus species NOT DETECTED NOT DETECTED Final   Staphylococcus aureus (BCID) NOT DETECTED NOT DETECTED Final   Staphylococcus epidermidis NOT DETECTED NOT DETECTED Final   Staphylococcus lugdunensis NOT DETECTED NOT DETECTED Final   Streptococcus species DETECTED (A) NOT DETECTED Final    Comment: Not Enterococcus species, Streptococcus agalactiae, Streptococcus pyogenes, or Streptococcus pneumoniae. CRITICAL RESULT CALLED TO, READ BACK BY AND VERIFIED WITH: NATHAN BEULE 10/19/21 0602 MW    Streptococcus agalactiae NOT DETECTED NOT DETECTED Final   Streptococcus pneumoniae NOT DETECTED NOT DETECTED Final   Streptococcus pyogenes NOT DETECTED NOT DETECTED Final   A.calcoaceticus-baumannii NOT DETECTED NOT DETECTED Final   Bacteroides fragilis NOT DETECTED NOT DETECTED Final   Enterobacterales NOT DETECTED NOT DETECTED Final   Enterobacter cloacae complex NOT DETECTED NOT DETECTED Final   Escherichia coli NOT DETECTED NOT DETECTED Final   Klebsiella aerogenes NOT DETECTED NOT DETECTED Final   Klebsiella oxytoca NOT DETECTED NOT DETECTED Final   Klebsiella pneumoniae NOT DETECTED NOT DETECTED Final   Proteus species NOT DETECTED NOT DETECTED Final   Salmonella species NOT DETECTED NOT DETECTED Final   Serratia marcescens NOT DETECTED NOT DETECTED Final   Haemophilus influenzae NOT DETECTED NOT DETECTED Final  Neisseria meningitidis NOT DETECTED NOT DETECTED Final   Pseudomonas aeruginosa NOT DETECTED NOT DETECTED Final   Stenotrophomonas maltophilia NOT DETECTED NOT DETECTED Final   Candida albicans NOT DETECTED NOT DETECTED Final   Candida auris NOT DETECTED NOT DETECTED Final    Candida glabrata NOT DETECTED NOT DETECTED Final   Candida krusei NOT DETECTED NOT DETECTED Final   Candida parapsilosis NOT DETECTED NOT DETECTED Final   Candida tropicalis NOT DETECTED NOT DETECTED Final   Cryptococcus neoformans/gattii NOT DETECTED NOT DETECTED Final    Comment: Performed at [facility], [address] [place], [place] [address]  Resp Panel by RT-PCR (Flu A&B, Covid) Nasopharyngeal Swab     Status: None   Collection Time: 10/18/21  8:16 PM   Specimen: Nasopharyngeal Swab; Nasopharyngeal(NP) swabs in vial transport medium  Result Value Ref Range Status   SARS Coronavirus 2 by RT PCR NEGATIVE NEGATIVE Final    Comment: (NOTE) SARS-CoV-2 target nucleic acids are NOT DETECTED.  The SARS-CoV-2 RNA is generally detectable in upper respiratory specimens during the acute phase of infection. The lowest concentration of SARS-CoV-2 viral copies this assay can detect is 138 copies/mL. A negative result does not preclude SARS-Cov-2 infection and should not be used as the sole basis for treatment or other patient management decisions. A negative result may occur with  improper specimen collection/handling, submission of specimen other than nasopharyngeal swab, presence of viral mutation(s) within the areas targeted by this assay, and inadequate number of viral copies(<138 copies/mL). A negative result must be combined with clinical observations, patient history, and epidemiological information. The expected result is Negative.  Fact Sheet for Patients:  EntrepreneurPulse.com.au  Fact Sheet for Healthcare Providers:  IncredibleEmployment.be  This test is no t yet approved or cleared by the [place] FDA and  has been authorized for detection and/or diagnosis of SARS-CoV-2 by FDA under an Emergency Use Authorization (EUA). This EUA will remain  in effect (meaning this test can be used) for the duration of the COVID-19  declaration under Section 564(b)(1) of the Act, 21 U.S.C.section 360bbb-3(b)(1), unless the authorization is terminated  or revoked sooner.       Influenza A by PCR NEGATIVE NEGATIVE Final   Influenza B by PCR NEGATIVE NEGATIVE Final    Comment: (NOTE) The Xpert Xpress SARS-CoV-2/FLU/RSV plus assay is intended as an aid in the diagnosis of influenza from Nasopharyngeal swab specimens and should not be used as a sole basis for treatment. Nasal washings and aspirates are unacceptable for Xpert Xpress SARS-CoV-2/FLU/RSV testing.  Fact Sheet for Patients: EntrepreneurPulse.com.au  Fact Sheet for Healthcare Providers: IncredibleEmployment.be  This test is not yet approved or cleared by the [place] FDA and has been authorized for detection and/or diagnosis of SARS-CoV-2 by FDA under an Emergency Use Authorization (EUA). This EUA will remain in effect (meaning this test can be used) for the duration of the COVID-19 declaration under Section 564(b)(1) of the Act, 21 U.S.C. section 360bbb-3(b)(1), unless the authorization is terminated or revoked.  Performed at [facility], [address] [place], [place] [address]   Culture, blood (Routine X 2) w Reflex to ID Panel     Status: None   Collection Time: 10/18/21 11:28 PM   Specimen: BLOOD  Result Value Ref Range Status   Specimen Description BLOOD LEFT HAND  Final   Special Requests   Final    BOTTLES DRAWN AEROBIC ONLY Blood Culture adequate volume   Culture   Final  NO GROWTH 5 DAYS Performed at [facility], [address] [place], [place] [address]    Report Status 10/23/2021 FINAL  Final  MRSA Next Gen by PCR, Nasal     Status: None   Collection Time: 10/19/21  5:04 AM   Specimen: Nasal Mucosa; Nasal Swab  Result Value Ref Range Status   MRSA by PCR Next Gen NOT DETECTED NOT DETECTED Final    Comment: (NOTE) The GeneXpert MRSA Assay (FDA approved for NASAL  specimens only), is one component of a comprehensive MRSA colonization surveillance program. It is not intended to diagnose MRSA infection nor to guide or monitor treatment for MRSA infections. Test performance is not FDA approved in patients less than 52 years old. Performed at [facility], [address] [place], [place] [address]   CULTURE, BLOOD (ROUTINE X 2) w Reflex to ID Panel     Status: None   Collection Time: 10/20/21  6:08 PM   Specimen: BLOOD  Result Value Ref Range Status   Specimen Description BLOOD LFOA  Final   Special Requests BOTTLES DRAWN AEROBIC AND ANAEROBIC BCAV  Final   Culture   Final    NO GROWTH 5 DAYS Performed at [facility], [address] [place], [place] [address]    Report Status 10/25/2021 FINAL  Final  CULTURE, BLOOD (ROUTINE X 2) w Reflex to ID Panel     Status: None   Collection Time: 10/20/21  6:23 PM   Specimen: BLOOD  Result Value Ref Range Status   Specimen Description BLOOD [facility]  Final   Special Requests BOTTLES DRAWN AEROBIC ONLY BCLV  Final   Culture   Final    NO GROWTH 5 DAYS Performed at [facility], [address], [place], [place] [address]    Report Status 10/25/2021 FINAL  Final     Labs: BNP (last 3 results) No results for input(s): BNP in the last 8760 hours. Basic Metabolic Panel: Recent Labs  Lab 10/22/21 0444 10/23/21 0516 10/24/21 0456 10/25/21 0422 10/26/21 0601  NA 134* 132* 133* 130* 131*  K 4.1 3.7 3.7 3.6 3.3*  CL 100 96* 99 96* 95*  CO2 26 25 23 24 24   GLUCOSE 129* 115* 120* 117* 106*  BUN 23 19 17 17 18   CREATININE 1.91* 1.65* 1.67* 1.66* 1.69*  CALCIUM 8.4* 8.3* 8.6* 8.7* 8.5*  MG 1.6* 2.0  --   --   --    Liver Function Tests: Recent Labs  Lab 10/20/21 0650  AST 42*  ALT 23  ALKPHOS 73  BILITOT 1.1  PROT 5.1*  ALBUMIN 2.7*   No results for input(s): LIPASE, AMYLASE in the last 168 hours. No results for input(s): AMMONIA in the last 168  hours. CBC: Recent Labs  Lab 10/22/21 0444 10/23/21 0516 10/24/21 0456 10/25/21 0422 10/26/21 0601  WBC 22.8* 19.5* 19.1* 16.6* 12.9*  HGB 10.0* 10.0* 9.9* 10.3* 10.4*  HCT 29.3* 29.5* 28.8* 30.6* 31.1*  MCV 96.7 97.0 96.3 98.1 98.4  PLT 266 290 360 427* 552*   Cardiac Enzymes: Recent Labs  Lab 10/20/21 0650 10/21/21 0534 10/22/21 0444  CKTOTAL 1,503* 861* 581*   BNP: Invalid input(s): POCBNP CBG: Recent Labs  Lab 10/25/21 0823 10/25/21 1243 10/25/21 1738 10/26/21 0922 10/26/21 1136  GLUCAP 103* 107* 131* 125* 111*   D-Dimer No results for input(s): DDIMER in the last 72 hours. Hgb A1c No results for input(s): HGBA1C in the last 72 hours. Lipid Profile No  results for input(s): CHOL, HDL, LDLCALC, TRIG, CHOLHDL, LDLDIRECT in the last 72 hours. Thyroid function studies No results for input(s): TSH, T4TOTAL, T3FREE, THYROIDAB in the last 72 hours.  Invalid input(s): FREET3 Anemia work up No results for input(s): VITAMINB12, FOLATE, FERRITIN, TIBC, IRON, RETICCTPCT in the last 72 hours. Urinalysis    Component Value Date/Time   COLORURINE YELLOW (A) 10/18/2021 1755   APPEARANCEUR HAZY (A) 10/18/2021 1755   APPEARANCEUR Clear 05/11/2015 1054   LABSPEC 1.012 10/18/2021 1755   LABSPEC 1.014 01/15/2014 1320   PHURINE 5.0 10/18/2021 1755   GLUCOSEU NEGATIVE 10/18/2021 1755   GLUCOSEU Negative 01/15/2014 1320   HGBUR MODERATE (A) 10/18/2021 [place] 10/18/2021 1755   BILIRUBINUR Negative 05/11/2015 1054   BILIRUBINUR Negative 01/15/2014 1320   KETONESUR NEGATIVE 10/18/2021 1755   PROTEINUR 100 (A) 10/18/2021 1755   UROBILINOGEN 1.0 11/29/2014 1534   NITRITE NEGATIVE 10/18/2021 1755   LEUKOCYTESUR NEGATIVE 10/18/2021 1755   LEUKOCYTESUR Negative 01/15/2014 1320   Sepsis Labs Invalid input(s): PROCALCITONIN,  WBC,  LACTICIDVEN Microbiology Recent Results (from the past 240 hour(s))  Blood culture (routine x 2)     Status: Abnormal    Collection Time: 10/18/21  6:24 PM   Specimen: BLOOD  Result Value Ref Range Status   Specimen Description   Final    BLOOD FOOT Performed at [facility], [address] [place], [place] [address]    Special Requests   Final    BOTTLES DRAWN AEROBIC AND ANAEROBIC BCAV Performed at [facility], [address] [place], [place] [address]    Culture  Setup Time   Final    Organism ID to follow IN [place] CRITICAL RESULT CALLED TO, READ BACK BY AND VERIFIED WITH: NATHAN BEULE 10/19/21 0602 MW    Culture (A)  Final    STREPTOCOCCUS GALLOLYTICUS STAPHYLOCOCCUS EPIDERMIDIS THE SIGNIFICANCE OF ISOLATING THIS ORGANISM FROM A SINGLE SET OF BLOOD CULTURES WHEN MULTIPLE SETS ARE DRAWN IS UNCERTAIN. PLEASE NOTIFY THE MICROBIOLOGY DEPARTMENT WITHIN ONE WEEK IF SPECIATION AND SENSITIVITIES ARE REQUIRED. CRITICAL RESULT CALLED TO, READ BACK BY AND VERIFIED WITH: [place]. [phone] FCP Performed at [facility], [address] [place], [place] [address]    Report Status 10/24/2021 FINAL  Final   Organism ID, Bacteria STREPTOCOCCUS GALLOLYTICUS  Final      Susceptibility   Streptococcus gallolyticus - MIC*    PENICILLIN 0.12 SENSITIVE Sensitive     CEFTRIAXONE <=0.12 SENSITIVE Sensitive     ERYTHROMYCIN <=0.12 SENSITIVE Sensitive     LEVOFLOXACIN 2 SENSITIVE Sensitive     VANCOMYCIN 0.5 SENSITIVE Sensitive     * STREPTOCOCCUS GALLOLYTICUS  Blood Culture ID Panel (Reflexed)     Status: Abnormal   Collection Time: 10/18/21  6:24 PM  Result Value Ref Range Status   Enterococcus faecalis NOT DETECTED NOT DETECTED Final   Enterococcus Faecium NOT DETECTED NOT DETECTED Final   Listeria monocytogenes NOT DETECTED NOT DETECTED Final   Staphylococcus species NOT DETECTED NOT DETECTED Final   Staphylococcus aureus (BCID) NOT DETECTED NOT DETECTED Final   Staphylococcus epidermidis NOT DETECTED NOT DETECTED Final    Staphylococcus lugdunensis NOT DETECTED NOT DETECTED Final   Streptococcus species DETECTED (A) NOT DETECTED Final    Comment: Not Enterococcus species, Streptococcus agalactiae, Streptococcus pyogenes, or Streptococcus pneumoniae. CRITICAL RESULT CALLED TO, READ BACK BY AND VERIFIED WITH: NATHAN BEULE 10/19/21 0602 MW  Streptococcus agalactiae NOT DETECTED NOT DETECTED Final   Streptococcus pneumoniae NOT DETECTED NOT DETECTED Final   Streptococcus pyogenes NOT DETECTED NOT DETECTED Final   A.calcoaceticus-baumannii NOT DETECTED NOT DETECTED Final   Bacteroides fragilis NOT DETECTED NOT DETECTED Final   Enterobacterales NOT DETECTED NOT DETECTED Final   Enterobacter cloacae complex NOT DETECTED NOT DETECTED Final   Escherichia coli NOT DETECTED NOT DETECTED Final   Klebsiella aerogenes NOT DETECTED NOT DETECTED Final   Klebsiella oxytoca NOT DETECTED NOT DETECTED Final   Klebsiella pneumoniae NOT DETECTED NOT DETECTED Final   Proteus species NOT DETECTED NOT DETECTED Final   Salmonella species NOT DETECTED NOT DETECTED Final   Serratia marcescens NOT DETECTED NOT DETECTED Final   Haemophilus influenzae NOT DETECTED NOT DETECTED Final   Neisseria meningitidis NOT DETECTED NOT DETECTED Final   Pseudomonas aeruginosa NOT DETECTED NOT DETECTED Final   Stenotrophomonas maltophilia NOT DETECTED NOT DETECTED Final   Candida albicans NOT DETECTED NOT DETECTED Final   Candida auris NOT DETECTED NOT DETECTED Final   Candida glabrata NOT DETECTED NOT DETECTED Final   Candida krusei NOT DETECTED NOT DETECTED Final   Candida parapsilosis NOT DETECTED NOT DETECTED Final   Candida tropicalis NOT DETECTED NOT DETECTED Final   Cryptococcus neoformans/gattii NOT DETECTED NOT DETECTED Final    Comment: Performed at [facility], [address] [place], [place] [address]  Resp Panel by RT-PCR (Flu A&B, Covid) Nasopharyngeal Swab     Status: None   Collection Time: 10/18/21  8:16 PM    Specimen: Nasopharyngeal Swab; Nasopharyngeal(NP) swabs in vial transport medium  Result Value Ref Range Status   SARS Coronavirus 2 by RT PCR NEGATIVE NEGATIVE Final    Comment: (NOTE) SARS-CoV-2 target nucleic acids are NOT DETECTED.  The SARS-CoV-2 RNA is generally detectable in upper respiratory specimens during the acute phase of infection. The lowest concentration of SARS-CoV-2 viral copies this assay can detect is 138 copies/mL. A negative result does not preclude SARS-Cov-2 infection and should not be used as the sole basis for treatment or other patient management decisions. A negative result may occur with  improper specimen collection/handling, submission of specimen other than nasopharyngeal swab, presence of viral mutation(s) within the areas targeted by this assay, and inadequate number of viral copies(<138 copies/mL). A negative result must be combined with clinical observations, patient history, and epidemiological information. The expected result is Negative.  Fact Sheet for Patients:  EntrepreneurPulse.com.au  Fact Sheet for Healthcare Providers:  IncredibleEmployment.be  This test is no t yet approved or cleared by the [place] FDA and  has been authorized for detection and/or diagnosis of SARS-CoV-2 by FDA under an Emergency Use Authorization (EUA). This EUA will remain  in effect (meaning this test can be used) for the duration of the COVID-19 declaration under Section 564(b)(1) of the Act, 21 U.S.C.section 360bbb-3(b)(1), unless the authorization is terminated  or revoked sooner.       Influenza A by PCR NEGATIVE NEGATIVE Final   Influenza B by PCR NEGATIVE NEGATIVE Final    Comment: (NOTE) The Xpert Xpress SARS-CoV-2/FLU/RSV plus assay is intended as an aid in the diagnosis of influenza from Nasopharyngeal swab specimens and should not be used as a sole basis for treatment. Nasal washings and aspirates are  unacceptable for Xpert Xpress SARS-CoV-2/FLU/RSV testing.  Fact Sheet for Patients: EntrepreneurPulse.com.au  Fact Sheet for Healthcare Providers: IncredibleEmployment.be  This test is not yet approved or cleared by the [place] and has been authorized  for detection and/or diagnosis of SARS-CoV-2 by FDA under an Emergency Use Authorization (EUA). This EUA will remain in effect (meaning this test can be used) for the duration of the COVID-19 declaration under Section 564(b)(1) of the Act, 21 U.S.C. section 360bbb-3(b)(1), unless the authorization is terminated or revoked.  Performed at [facility], [address] [place], [place] [address]   Culture, blood (Routine X 2) w Reflex to ID Panel     Status: None   Collection Time: 10/18/21 11:28 PM   Specimen: BLOOD  Result Value Ref Range Status   Specimen Description BLOOD LEFT HAND  Final   Special Requests   Final    BOTTLES DRAWN AEROBIC ONLY Blood Culture adequate volume   Culture   Final    NO GROWTH 5 DAYS Performed at [facility], [address], [place], [place] [address]    Report Status 10/23/2021 FINAL  Final  MRSA Next Gen by PCR, Nasal     Status: None   Collection Time: 10/19/21  5:04 AM   Specimen: Nasal Mucosa; Nasal Swab  Result Value Ref Range Status   MRSA by PCR Next Gen NOT DETECTED NOT DETECTED Final    Comment: (NOTE) The GeneXpert MRSA Assay (FDA approved for NASAL specimens only), is one component of a comprehensive MRSA colonization surveillance program. It is not intended to diagnose MRSA infection nor to guide or monitor treatment for MRSA infections. Test performance is not FDA approved in patients less than 16 years old. Performed at [facility], [address] [place], [place] [address]   CULTURE, BLOOD (ROUTINE X 2) w Reflex to ID Panel     Status: None   Collection Time: 10/20/21  6:08 PM   Specimen: BLOOD   Result Value Ref Range Status   Specimen Description BLOOD LFOA  Final   Special Requests BOTTLES DRAWN AEROBIC AND ANAEROBIC BCAV  Final   Culture   Final    NO GROWTH 5 DAYS Performed at [facility], [address] [place], [place] [address]    Report Status 10/25/2021 FINAL  Final  CULTURE, BLOOD (ROUTINE X 2) w Reflex to ID Panel     Status: None   Collection Time: 10/20/21  6:23 PM   Specimen: BLOOD  Result Value Ref Range Status   Specimen Description BLOOD [facility]  Final   Special Requests BOTTLES DRAWN AEROBIC ONLY BCLV  Final   Culture   Final    NO GROWTH 5 DAYS Performed at [facility], [address], [place], [place] [address]    Report Status 10/25/2021 FINAL  Final     Patient was seen and examined on the day of discharge and was found to be in stable condition. Time coordinating discharge: 35 minutes including assessment and coordination of care, as well as examination of the patient.   SIGNED:  Dessa Phi, DO [facility] 10/26/2021, 11:56 AM

## 2021-10-26 NOTE — TOC Initial Note (Signed)
Transition of Care [facility]) - Initial/Assessment Note    Patient Details  Name: Edward Singleton MRN: [id number] Date of Birth: 06/17/1951  Transition of [facility]) CM/SW Contact:    Eileen Stanford, LCSW Phone Number: 10/26/2021, 10:05 AM  Clinical Narrative:  CSW spoke with pt and he is agreeable to SNF placement. CSW is working with the [place] to determine placement options. CSW will updated pt.                 Expected Discharge Plan: Skilled Nursing Facility Barriers to Discharge: Continued Medical Work up   Patient Goals and CMS Choice Patient states their goals for this hospitalization and ongoing recovery are:: to get better   Choice offered to / list presented to : Patient  Expected Discharge Plan and Services Expected Discharge Plan: [place] In-house Referral: Clinical Social Work   Post Acute Care Choice: [place] Living arrangements for the past 2 months: [place]                                      Prior Living Arrangements/Services Living arrangements for the past 2 months: Single Family Home Lives with:: Self Patient language and need for interpreter reviewed:: Yes Do you feel safe going back to the place where you live?: Yes      Need for Family Participation in Patient Care: Yes (Comment) Care giver support system in place?: Yes (comment)   Criminal Activity/Legal Involvement Pertinent to Current Situation/Hospitalization: No - Comment as needed  Activities of Daily Living Home Assistive Devices/Equipment: None ADL Screening (condition at time of admission) Patient's cognitive ability adequate to safely complete daily activities?: Yes Is the patient deaf or have difficulty hearing?: No Does the patient have difficulty seeing, even when wearing glasses/contacts?: No Does the patient have difficulty concentrating, remembering, or making decisions?: Yes Patient able to express need for assistance with ADLs?:  Yes Does the patient have difficulty dressing or bathing?: Yes Independently performs ADLs?: No Communication: Independent Dressing (OT): Needs assistance Is this a change from baseline?: Change from baseline, expected to last >3 days Grooming: Needs assistance Is this a change from baseline?: Change from baseline, expected to last >3 days Feeding: Needs assistance Is this a change from baseline?: Change from baseline, expected to last >3 days Bathing: Needs assistance Is this a change from baseline?: Change from baseline, expected to last >3 days Toileting: Needs assistance Is this a change from baseline?: Change from baseline, expected to last >3days In/Out Bed: Independent with device (comment) Walks in Home: Independent Does the patient have difficulty walking or climbing stairs?: Yes Weakness of Legs: Both Weakness of Arms/Hands: Right  Permission Sought/Granted Permission sought to share information with : Family Supports    Share Information with NAME: [name]     Permission granted to share info w Relationship: sister     Emotional Assessment Appearance:: Appears stated age Attitude/Demeanor/Rapport: Engaged Affect (typically observed): Accepting Orientation: : Oriented to Self, Oriented to Place, Oriented to  Time, Oriented to Situation Alcohol / Substance Use: Not Applicable Psych Involvement: No (comment)  Admission diagnosis:  Rhabdomyolysis [M62.82] Patient Active Problem List   Diagnosis Date Noted   Metabolic acidosis    Shock ([place])    Rhabdomyolysis 10/18/2021   Delayed emergence from anesthesia 03/18/2019   Hypokalemia 69/48/5462   Alcoholic ketoacidosis 70/35/0093   Alcohol withdrawal syndrome with complication ([place])  02/21/2019   Diabetes ([place]) 02/21/2019   Left rotator cuff tear arthropathy 12/28/2015   Hyperlipidemia 12/24/2015   Tobacco abuse 11/06/2015   Mood disturbance 11/06/2015   CAD (coronary artery disease) 10/16/2015   PAD (peripheral  artery disease) ([place]) 10/16/2015   Preventative health care 08/13/2015   Renal artery stenosis ([place]) 07/30/2015   AKI (acute kidney injury) ([place]) 07/30/2015   Kidney atrophy 07/30/2015   CKD (chronic kidney disease), stage III ([place]) 07/30/2015   Insomnia 07/22/2015   History of substance abuse ([place]) 07/15/2015   BPH (benign prostatic hyperplasia) 07/15/2015   Gout 07/15/2015   Kidney stones 05/11/2015   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 [place] [address] Phone: [phone] Fax: [place], [place] - [place] [address] [place] [address] Phone: [phone] Fax: [phone]     Social Determinants of Health (SDOH) Interventions    Readmission Risk Interventions Readmission Risk Prevention Plan 03/15/2019  Transportation Screening Complete  PCP or Specialist Appt within 3-5 Days Complete  HRI or [place] Complete  Social Work Consult for [place] Planning/Counseling Complete  Palliative Care Screening Complete  Medication Review Press photographer) Complete  Some recent data might be hidden

## 2021-10-26 NOTE — Progress Notes (Signed)
Patient arrived in the unit 5N17 from [facility], alert and oriented x2, foley catheter in place, c/o right shoulder pain 9/10. Noted some rashes on his back. Admitting MD was paged.

## 2021-10-26 NOTE — H&P (Signed)
History and Physical    Edward Singleton BMW:[id number] DOB: 1951/01/25 DOA: 10/26/2021  PCP: Windy Fast, MD   Patient coming from: [facility], home prior to that   Chief Complaint: Right proximal humerus fracture   HPI: Edward Singleton is a pleasant 71 y.o. male with medical history significant for alcohol abuse, chronic renal insufficiency, anemia, hypertension, and type 2 diabetes mellitus who has been transferred from [facility] for operative management of proximal right humerus fracture.  He had presented to [facility] ED on 10/18/2021 after he had not been seen for several days and was found down at home after police forced entry, had significant bruising on his right arm, was confused, hypotensive, and hypothermic.  Per report of his sister, [name], who is healthcare power of attorney, he had been binge drinking since the summer.  [facility] ED and Hospital Course: Upon arrival to the ED, patient was found to be hypotensive with systolic pressures in the 70s, hypothermic to 32.4 C.  He was found to be in rhabdomyolysis with AKI superimposed on CKD, had initial lactic acid greater than 8, elevated procalcitonin, proximal right humerus fracture, and prolonged QT interval.  He was fluid resuscitated, given warmed IV fluids initially, admitted to the ICU on 10/18/2021, was requiring Levophed, but was stabilized and off of Levophed by 10/20/2021.  He was seen by nephrology during the hospitalization, serum CK trended down, and his renal function returned to baseline.  He had a streptococcal bacteremia, was evaluated by ID, noted that this could possibly be related to a colon malignancy or contaminant, was treated with Rocephin, has since been transitioned to cefazolin, and it was recommended that he continue antibiotics through 11/03/2021 which could be changed to amoxicillin when he is discharged.  Orthopedic surgeon at [facility] discussed the case with Dr. Griffin Basil who agreed with transfer to [facility] with tentative  plan for reverse arthroplasty on 10/27/2021.  Review of Systems:  All other systems reviewed and apart from HPI, are negative.  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   Diabetes mellitus without complication ([place])    Gout    Gout    Hypercholesteremia    Hypertension    Kidney stones    Left flank pain, chronic    Pelvic fracture ([place]) 11/17/2014    Past Surgical History:  Procedure Laterality Date   ESOPHAGOGASTRODUODENOSCOPY Left 03/18/2019   Procedure: ESOPHAGOGASTRODUODENOSCOPY (EGD);  Surgeon: Jonathon Bellows, MD;  Location: [facility] ENDOSCOPY;  Service: Gastroenterology;  Laterality: Left;   left renal stent placement  2013   LITHOTRIPSY     ORIF HUMERUS FRACTURE Left 11/18/2014   Procedure: OPEN REDUCTION INTERNAL FIXATION (ORIF) DISTAL HUMERUS FRACTURE;  Surgeon: Rozanna Box, MD;  Location: [place];  Service: Orthopedics;  Laterality: Left;   SPLENECTOMY      Social History:   reports that he has been smoking cigarettes. He has a 50.00 pack-year smoking history. He has quit using smokeless tobacco.  His smokeless tobacco use included chew. He reports that he does not currently use alcohol after a past usage of about 42.0 standard drinks per week. He reports that he does not use drugs.  No Known Allergies  Family History  Problem Relation Age of Onset   Heart disease Mother    Heart disease Father    Diabetes Father    Nephrolithiasis  Paternal Grandfather    Kidney disease Neg Hx    Prostate cancer Neg Hx      Prior to Admission medications   Medication Sig Start Date End Date Taking? Authorizing Provider  folic acid (FOLVITE) 1 MG tablet Take 1 mg by mouth daily.    [provider]  metFORMIN (GLUCOPHAGE) 500 MG tablet Take 500 mg by mouth 2 (two) times daily. 06/04/19   [provider]  rosuvastatin (CRESTOR) 20 MG tablet Take 20 mg by mouth  daily.    [provider]  thiamine (VITAMIN B-1) 100 MG tablet Take 100 mg by mouth daily.    [provider]    Physical Exam: Vitals:   10/26/21 2251 10/26/21 2252  BP: 128/71 128/71  Pulse: (!) 113 (!) 114  Resp:  18  Temp: 99.3 F (37.4 C) 99.3 F (37.4 C)  TempSrc: Oral   SpO2: 95% 95%    Constitutional: NAD, calm  Eyes: PERTLA, lids and conjunctivae normal ENMT: Mucous membranes are moist. Posterior pharynx clear of any exudate or lesions.   Neck: supple, no masses  Respiratory: no wheezing, no crackles. No accessory muscle use.  Cardiovascular: Rate ~100 and regular. No extremity edema.  Abdomen: No distension, no tenderness, soft. Bowel sounds active.  Musculoskeletal: no clubbing / cyanosis. RUE edema, ecchymosis, and tenderness.   Skin: Erythematous macules on back, some with crust. Warm, dry, well-perfused. Neurologic: CN 2-12 grossly intact. Moving all extremities. Alert and oriented.  Psychiatric: Pleasant. Cooperative.    Labs and Imaging on Admission: I have personally reviewed following labs and imaging studies  CBC: Recent Labs  Lab 10/22/21 0444 10/23/21 0516 10/24/21 0456 10/25/21 0422 10/26/21 0601  WBC 22.8* 19.5* 19.1* 16.6* 12.9*  HGB 10.0* 10.0* 9.9* 10.3* 10.4*  HCT 29.3* 29.5* 28.8* 30.6* 31.1*  MCV 96.7 97.0 96.3 98.1 98.4  PLT 266 290 360 427* 623*   Basic Metabolic Panel: Recent Labs  Lab 10/22/21 0444 10/23/21 0516 10/24/21 0456 10/25/21 0422 10/26/21 0601  NA 134* 132* 133* 130* 131*  K 4.1 3.7 3.7 3.6 3.3*  CL 100 96* 99 96* 95*  CO2 26 25 23 24 24   GLUCOSE 129* 115* 120* 117* 106*  BUN 23 19 17 17 18   CREATININE 1.91* 1.65* 1.67* 1.66* 1.69*  CALCIUM 8.4* 8.3* 8.6* 8.7* 8.5*  MG 1.6* 2.0  --   --   --    GFR: Estimated Creatinine Clearance: 38.6 mL/min (A) (by C-G formula based on SCr of 1.69 mg/dL (H)). Liver Function Tests: Recent Labs  Lab 10/20/21 0650  AST 42*  ALT 23  ALKPHOS 73  BILITOT  1.1  PROT 5.1*  ALBUMIN 2.7*   No results for input(s): LIPASE, AMYLASE in the last 168 hours. No results for input(s): AMMONIA in the last 168 hours. Coagulation Profile: No results for input(s): INR, PROTIME in the last 168 hours. Cardiac Enzymes: Recent Labs  Lab 10/20/21 0650 10/21/21 0534 10/22/21 0444  CKTOTAL 1,503* 861* 581*   BNP (last 3 results) No results for input(s): PROBNP in the last 8760 hours. HbA1C: No results for input(s): HGBA1C in the last 72 hours. CBG: Recent Labs  Lab 10/25/21 1738 10/26/21 0922 10/26/21 1136 10/26/21 1646 10/27/21 0009  GLUCAP 131* 125* 111* 124* 105*   Lipid Profile: No results for input(s): CHOL, HDL, LDLCALC, TRIG, CHOLHDL, LDLDIRECT in the last 72 hours. Thyroid Function Tests: No results for input(s): TSH, T4TOTAL, FREET4, T3FREE, THYROIDAB in the last 72 hours. Anemia  Panel: No results for input(s): VITAMINB12, FOLATE, FERRITIN, TIBC, IRON, RETICCTPCT in the last 72 hours. Urine analysis:    Component Value Date/Time   COLORURINE YELLOW (A) 10/18/2021 1755   APPEARANCEUR HAZY (A) 10/18/2021 1755   APPEARANCEUR Clear 05/11/2015 1054   LABSPEC 1.012 10/18/2021 1755   LABSPEC 1.014 01/15/2014 1320   PHURINE 5.0 10/18/2021 1755   GLUCOSEU NEGATIVE 10/18/2021 1755   GLUCOSEU Negative 01/15/2014 1320   HGBUR MODERATE (A) 10/18/2021 [place] 10/18/2021 1755   BILIRUBINUR Negative 05/11/2015 1054   BILIRUBINUR Negative 01/15/2014 1320   KETONESUR NEGATIVE 10/18/2021 1755   PROTEINUR 100 (A) 10/18/2021 1755   UROBILINOGEN 1.0 11/29/2014 1534   NITRITE NEGATIVE 10/18/2021 1755   LEUKOCYTESUR NEGATIVE 10/18/2021 1755   LEUKOCYTESUR Negative 01/15/2014 1320   Sepsis Labs: @LABRCNTIP (procalcitonin:4,lacticidven:4) ) Recent Results (from the past 240 hour(s))  Blood culture (routine x 2)     Status: Abnormal   Collection Time: 10/18/21  6:24 PM   Specimen: BLOOD  Result Value Ref Range Status    Specimen Description   Final    BLOOD FOOT Performed at [facility], [address] [place], [place] [address]    Special Requests   Final    BOTTLES DRAWN AEROBIC AND ANAEROBIC BCAV Performed at [facility], [address] [place], [place] [address]    Culture  Setup Time   Final    Organism ID to follow IN [place] CRITICAL RESULT CALLED TO, READ BACK BY AND VERIFIED WITH: NATHAN BEULE 10/19/21 0602 MW    Culture (A)  Final    STREPTOCOCCUS GALLOLYTICUS STAPHYLOCOCCUS EPIDERMIDIS THE SIGNIFICANCE OF ISOLATING THIS ORGANISM FROM A SINGLE SET OF BLOOD CULTURES WHEN MULTIPLE SETS ARE DRAWN IS UNCERTAIN. PLEASE NOTIFY THE MICROBIOLOGY DEPARTMENT WITHIN ONE WEEK IF SPECIATION AND SENSITIVITIES ARE REQUIRED. CRITICAL RESULT CALLED TO, READ BACK BY AND VERIFIED WITH: [place]. [id number] FCP Performed at [facility], [address], [place], [place] [address]    Report Status 10/24/2021 FINAL  Final   Organism ID, Bacteria STREPTOCOCCUS GALLOLYTICUS  Final      Susceptibility   Streptococcus gallolyticus - MIC*    PENICILLIN 0.12 SENSITIVE Sensitive     CEFTRIAXONE <=0.12 SENSITIVE Sensitive     ERYTHROMYCIN <=0.12 SENSITIVE Sensitive     LEVOFLOXACIN 2 SENSITIVE Sensitive     VANCOMYCIN 0.5 SENSITIVE Sensitive     * STREPTOCOCCUS GALLOLYTICUS  Blood Culture ID Panel (Reflexed)     Status: Abnormal   Collection Time: 10/18/21  6:24 PM  Result Value Ref Range Status   Enterococcus faecalis NOT DETECTED NOT DETECTED Final   Enterococcus Faecium NOT DETECTED NOT DETECTED Final   Listeria monocytogenes NOT DETECTED NOT DETECTED Final   Staphylococcus species NOT DETECTED NOT DETECTED Final   Staphylococcus aureus (BCID) NOT DETECTED NOT DETECTED Final   Staphylococcus epidermidis NOT DETECTED NOT DETECTED Final   Staphylococcus lugdunensis NOT DETECTED NOT DETECTED Final   Streptococcus species DETECTED  (A) NOT DETECTED Final    Comment: Not Enterococcus species, Streptococcus agalactiae, Streptococcus pyogenes, or Streptococcus pneumoniae. CRITICAL RESULT CALLED TO, READ BACK BY AND VERIFIED WITH: NATHAN BEULE 10/19/21 0602 MW    Streptococcus agalactiae NOT DETECTED NOT DETECTED Final   Streptococcus pneumoniae NOT DETECTED NOT DETECTED Final   Streptococcus pyogenes NOT DETECTED NOT DETECTED Final   A.calcoaceticus-baumannii NOT DETECTED NOT DETECTED Final   Bacteroides fragilis NOT DETECTED NOT DETECTED  Final   Enterobacterales NOT DETECTED NOT DETECTED Final   Enterobacter cloacae complex NOT DETECTED NOT DETECTED Final   Escherichia coli NOT DETECTED NOT DETECTED Final   Klebsiella aerogenes NOT DETECTED NOT DETECTED Final   Klebsiella oxytoca NOT DETECTED NOT DETECTED Final   Klebsiella pneumoniae NOT DETECTED NOT DETECTED Final   Proteus species NOT DETECTED NOT DETECTED Final   Salmonella species NOT DETECTED NOT DETECTED Final   Serratia marcescens NOT DETECTED NOT DETECTED Final   Haemophilus influenzae NOT DETECTED NOT DETECTED Final   Neisseria meningitidis NOT DETECTED NOT DETECTED Final   Pseudomonas aeruginosa NOT DETECTED NOT DETECTED Final   Stenotrophomonas maltophilia NOT DETECTED NOT DETECTED Final   Candida albicans NOT DETECTED NOT DETECTED Final   Candida auris NOT DETECTED NOT DETECTED Final   Candida glabrata NOT DETECTED NOT DETECTED Final   Candida krusei NOT DETECTED NOT DETECTED Final   Candida parapsilosis NOT DETECTED NOT DETECTED Final   Candida tropicalis NOT DETECTED NOT DETECTED Final   Cryptococcus neoformans/gattii NOT DETECTED NOT DETECTED Final    Comment: Performed at [facility], [address] [place], [place] [address]  Resp Panel by RT-PCR (Flu A&B, Covid) Nasopharyngeal Swab     Status: None   Collection Time: 10/18/21  8:16 PM   Specimen: Nasopharyngeal Swab; Nasopharyngeal(NP) swabs in vial transport medium  Result Value  Ref Range Status   SARS Coronavirus 2 by RT PCR NEGATIVE NEGATIVE Final    Comment: (NOTE) SARS-CoV-2 target nucleic acids are NOT DETECTED.  The SARS-CoV-2 RNA is generally detectable in upper respiratory specimens during the acute phase of infection. The lowest concentration of SARS-CoV-2 viral copies this assay can detect is 138 copies/mL. A negative result does not preclude SARS-Cov-2 infection and should not be used as the sole basis for treatment or other patient management decisions. A negative result may occur with  improper specimen collection/handling, submission of specimen other than nasopharyngeal swab, presence of viral mutation(s) within the areas targeted by this assay, and inadequate number of viral copies(<138 copies/mL). A negative result must be combined with clinical observations, patient history, and epidemiological information. The expected result is Negative.  Fact Sheet for Patients:  EntrepreneurPulse.com.au  Fact Sheet for Healthcare Providers:  IncredibleEmployment.be  This test is no t yet approved or cleared by the [place] FDA and  has been authorized for detection and/or diagnosis of SARS-CoV-2 by FDA under an Emergency Use Authorization (EUA). This EUA will remain  in effect (meaning this test can be used) for the duration of the COVID-19 declaration under Section 564(b)(1) of the Act, 21 U.S.C.section 360bbb-3(b)(1), unless the authorization is terminated  or revoked sooner.       Influenza A by PCR NEGATIVE NEGATIVE Final   Influenza B by PCR NEGATIVE NEGATIVE Final    Comment: (NOTE) The Xpert Xpress SARS-CoV-2/FLU/RSV plus assay is intended as an aid in the diagnosis of influenza from Nasopharyngeal swab specimens and should not be used as a sole basis for treatment. Nasal washings and aspirates are unacceptable for Xpert Xpress SARS-CoV-2/FLU/RSV testing.  Fact Sheet for  Patients: EntrepreneurPulse.com.au  Fact Sheet for Healthcare Providers: IncredibleEmployment.be  This test is not yet approved or cleared by the [place] FDA and has been authorized for detection and/or diagnosis of SARS-CoV-2 by FDA under an Emergency Use Authorization (EUA). This EUA will remain in effect (meaning this test can be used) for the duration of the COVID-19 declaration under Section 564(b)(1) of the Act, 21 U.S.C.  section 360bbb-3(b)(1), unless the authorization is terminated or revoked.  Performed at [facility], [address] [place], [place] [address]   Culture, blood (Routine X 2) w Reflex to ID Panel     Status: None   Collection Time: 10/18/21 11:28 PM   Specimen: BLOOD  Result Value Ref Range Status   Specimen Description BLOOD LEFT HAND  Final   Special Requests   Final    BOTTLES DRAWN AEROBIC ONLY Blood Culture adequate volume   Culture   Final    NO GROWTH 5 DAYS Performed at [facility], [address], [place], [place] [address]    Report Status 10/23/2021 FINAL  Final  MRSA Next Gen by PCR, Nasal     Status: None   Collection Time: 10/19/21  5:04 AM   Specimen: Nasal Mucosa; Nasal Swab  Result Value Ref Range Status   MRSA by PCR Next Gen NOT DETECTED NOT DETECTED Final    Comment: (NOTE) The GeneXpert MRSA Assay (FDA approved for NASAL specimens only), is one component of a comprehensive MRSA colonization surveillance program. It is not intended to diagnose MRSA infection nor to guide or monitor treatment for MRSA infections. Test performance is not FDA approved in patients less than 79 years old. Performed at [name], [address] [place], [place] [address]   CULTURE, BLOOD (ROUTINE X 2) w Reflex to ID Panel     Status: None   Collection Time: 10/20/21  6:08 PM   Specimen: BLOOD  Result Value Ref Range Status   Specimen Description BLOOD LFOA  Final    Special Requests BOTTLES DRAWN AEROBIC AND ANAEROBIC BCAV  Final   Culture   Final    NO GROWTH 5 DAYS Performed at [facility], [address] [place], [place] [address]    Report Status 10/25/2021 FINAL  Final  CULTURE, BLOOD (ROUTINE X 2) w Reflex to ID Panel     Status: None   Collection Time: 10/20/21  6:23 PM   Specimen: BLOOD  Result Value Ref Range Status   Specimen Description BLOOD [facility]  Final   Special Requests BOTTLES DRAWN AEROBIC ONLY BCLV  Final   Culture   Final    NO GROWTH 5 DAYS Performed at [facility], [address], [place], [place] [address]    Report Status 10/25/2021 FINAL  Final     Radiological Exams on Admission: DG CHEST PORT 1 VIEW  Result Date: 10/26/2021 CLINICAL DATA:  Preoperative assessment, right humeral fracture EXAM: PORTABLE CHEST 1 VIEW COMPARISON:  10/20/2021 FINDINGS: Single frontal view of the chest demonstrates a stable cardiac silhouette. No acute airspace disease, effusion, or pneumothorax. Stable emphysema. No acute bony abnormality. Stable bilateral healed rib fractures. IMPRESSION: 1. Stable emphysema.  No acute intrathoracic process. Electronically Signed   By: Randa Ngo M.D.   On: 10/26/2021 23:56     Assessment/Plan   1. Proximal right humerus fracture  - Presents in transfer from [facility] for orthopedic surgery consult, likely reverse arthoplasty  - Keep NPO after midnight, check EKG, continue pain-control and supportive care   2. Strep bacteremia   - Seen by ID at [facility], source unclear, subsequent cultures negative, recommended to continue antibiotic until 1/25 with cefazolin for now and amoxicillin on discharge    3. CAD; PAD  - No anginal complaints or acute ischemia  - Continue statin; has been off ASA since admission, consider resuming after surgery    4. CKD  IIIb  - SCr is 1.69 on transfer; this appears to be his baseline  - Renally-dose medications, monitor    5. Urinary retention  - Foley  placed at [facility] and present on arrival here  - Continue Foley care for now and voiding trial after surgery    6. Hypokalemia; hyponatremia  - Serum potassium 3.3 and supplemented at [facility] prior to transfer, will repeat chemistries in am  - Sodium 131, he appears euvolemic and this appears to be chronic, will repeat chem panel in am    7. Anemia  - He was transfused on 1/12  - Hgb stable in 10-range last few days with no apparent bleeding    8. Alcohol abuse   - Unlikely to experience withdrawal at this point, continue b-vitamins and encourage alcohol avoidance going forward    DVT prophylaxis: SCDs  Code Status: Full  Level of Care: Level of care: Med-Surg Family Communication: None present, sister Chauncy Passy) called with updated at pt request but not answering  Disposition Plan:  Patient is from: Home  Anticipated d/c is to: TBD Anticipated d/c date is: 10/29/21  Patient currently: Pending arthroplasty right shoulder  Consults called: orthopedic surgery  Admission status: Inpatient     Vianne Bulls, MD [facility]  10/27/2021, 12:16 AM

## 2021-10-26 NOTE — Consult Note (Shared)
ORTHOPAEDIC CONSULTATION  REQUESTING PHYSICIAN: Hiram Gash, MD  Chief Complaint: right proximal humerus fracture  HPI: Edward Singleton is a 71 y.o. male with  alcohol abuse, diabetes, HTN, HLD. He had a fall this weekend. Patient admitted to the [facility] for alcoholic ketosis, hypothermia, shock, and rhabdomyolysis. Blood cultures positive for Streptococcus Galloyticus. He was found to have displaced right proximal humerus fracture. He was transferred to [facility] for operative management.   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   Diabetes mellitus without complication ([place])    Gout    Gout    Hypercholesteremia    Hypertension    Kidney stones    Left flank pain, chronic    Pelvic fracture ([place]) 11/17/2014   Past Surgical History:  Procedure Laterality Date   ESOPHAGOGASTRODUODENOSCOPY Left 03/18/2019   Procedure: ESOPHAGOGASTRODUODENOSCOPY (EGD);  Surgeon: Jonathon Bellows, MD;  Location: [facility] ENDOSCOPY;  Service: Gastroenterology;  Laterality: Left;   left renal stent placement  2013   LITHOTRIPSY     ORIF HUMERUS FRACTURE Left 11/18/2014   Procedure: OPEN REDUCTION INTERNAL FIXATION (ORIF) DISTAL HUMERUS FRACTURE;  Surgeon: Rozanna Box, MD;  Location: [name];  Service: Orthopedics;  Laterality: Left;   SPLENECTOMY     Social History   Socioeconomic History   Marital status: Single    Spouse name: Not on file   Number of children: Not on file   Years of education: Not on file   Highest education level: Not on file  Occupational History   Not on file  Tobacco Use   Smoking status: Every Day    Packs/day: 1.00    Years: 50.00    Pack years: 50.00    Types: Cigarettes   Smokeless tobacco: Former    Types: Nurse, children's Use: Never used  Substance and Sexual Activity   Alcohol use: Not Currently    Alcohol/week: 42.0  standard drinks    Types: 42 Cans of beer per week    Comment: quit june 4th   Drug use: No   Sexual activity: Not on file  Other Topics Concern   Not on file  Social History Narrative   Not on file   Social Determinants of Health   Financial Resource Strain: Not on file  Food Insecurity: Not on file  Transportation Needs: Not on file  Physical Activity: Not on file  Stress: Not on file  Social Connections: Not on file   Family History  Problem Relation Age of Onset   Heart disease Mother    Heart disease Father    Diabetes Father    Nephrolithiasis Paternal Grandfather    Kidney disease Neg Hx    Prostate cancer Neg Hx    No Known Allergies Prior to Admission medications   Medication Sig Start Date End Date Taking? Authorizing Provider  allopurinol (ZYLOPRIM) 300 MG tablet Take 150 mg by mouth daily.    [provider]  amLODipine (NORVASC) 5 MG tablet Take 5 mg by mouth daily at 12 noon. 06/04/19   [provider]  aspirin 81 MG chewable tablet Chew 81 mg by mouth daily.    [provider]  escitalopram (LEXAPRO) 10 MG tablet Take 10 mg by mouth daily.    [provider]  folic acid (  FOLVITE) 1 MG tablet Take 1 mg by mouth daily.    [provider]  metFORMIN (GLUCOPHAGE) 500 MG tablet Take 500 mg by mouth 2 (two) times daily. 06/04/19   [provider]  metoprolol succinate (TOPROL-XL) 50 MG 24 hr tablet Take 0.5 tablets by mouth daily. 06/04/19   [provider]  rosuvastatin (CRESTOR) 20 MG tablet Take 20 mg by mouth daily.    [provider]  thiamine (VITAMIN B-1) 100 MG tablet Take 100 mg by mouth daily.    [provider]  traZODone (DESYREL) 50 MG tablet Take 50 mg by mouth daily.    [provider]   No results found. Family History Reviewed and non-contributory, no pertinent history of problems with bleeding or anesthesia      Review of Systems 14 system ROS conducted and  negative except for that noted in HPI   OBJECTIVE  Vitals:No data found. General: Alert, no acute distress Cardiovascular: Warm extremities noted Respiratory: No cyanosis, no use of accessory musculature GI: No organomegaly, abdomen is soft and non-tender Skin: No lesions in the area of chief complaint other than those listed below in MSK exam.  Neurologic: Sensation intact distally save for the below mentioned MSK exam Psychiatric: Patient is competent for consent with normal mood and affect Lymphatic: No swelling obvious and reported other than the area involved in the exam below  Extremities  RUE: *** LUE: actively moves left upper extremity without pain. NVI Bilateral lower extremity: actively moves bilateral lower extremity without pain. Non tender to palpation throughout. Neurovascularly intact.     Test Results Imaging CT of right shoulder demonstrates "Severely comminuted and displaced fracture of the surgical neck of right proximal humerus. 4 cm of lateral displacement, 2 cm of superior displacement. Numerous fracture fragments are located along the anterior humeral head. Severely comminuted fracture of the greater tuberosity and lesser tuberosity. Fracture cleft extends to the superior articular surface."  Labs cbc Recent Labs    10/25/21 0422 10/26/21 0601  WBC 16.6* 12.9*  HGB 10.3* 10.4*  HCT 30.6* 31.1*  PLT 427* 552*    Labs inflam No results for input(s): CRP in the last 72 hours.  Invalid input(s): ESR  Labs coag No results for input(s): INR, PTT in the last 72 hours.  Invalid input(s): PT  Recent Labs    10/25/21 0422 10/26/21 0601  NA 130* 131*  K 3.6 3.3*  CL 96* 95*  CO2 24 24  GLUCOSE 117* 106*  BUN 17 18  CREATININE 1.66* 1.69*  CALCIUM 8.7* 8.5*     ASSESSMENT AND PLAN: 71 y.o. male with the following: Displaced right proximal humerus fracture  Orthopedics recommends admission to a medical service and we will provide  consultation and follow along.   The risks benefits and alternatives were discussed with the patient including but not limited to the risks of nonoperative treatment, versus surgical intervention including infection, bleeding, nerve injury, periprosthetic fracture, the need for revision surgery, blood clots, cardiopulmonary complications, morbidity, mortality, among others, and they were willing to proceed.    We additionally specifically discussed risks of axillary nerve injury, infection, periprosthetic fracture, continued pain and longevity of implants prior to beginning procedure.     - Weight Bearing Status/Activity: NWB RUE - Additional recommended labs/tests: None - VTE Prophylaxis: per primary - Pain control: PRN pain medications -Procedures: Right reverse total shoulder arthroplasty for fracture  - NPO at midnight

## 2021-10-26 NOTE — Progress Notes (Signed)
Subjective:  Patient resting in bed.  His sling is off.  Patient is alert and can answer questions and follow commands.  Patient is now stable after alcoholic ketosis, hypothermia and circulatory shock.  He was treated with pressure support.  Patient had 1 out of 2 sets of blood cultures positive initially for Streptococcus Galloyticus, but repeat blood cultures are negative.  Patient's rhabdomyolysis is improving.  Patient had a CT scan of the right upper extremity showing no abscess or fluid collection.  Patient is being switched to cefazolin.  Infectious disease has recommended IV antibiotic coverage until 11/03/2021.  Objective:   VITALS:   Vitals:   10/25/21 1800 10/25/21 1924 10/26/21 0415 10/26/21 0835  BP: 120/70 133/73 (!) 141/69 139/79  Pulse: 90 (!) 104 97 94  Resp: 16 20 16 18   Temp: (!) 97.4 F (36.3 C)  97.9 F (36.6 C) 97.6 F (36.4 C)  TempSrc: Oral   Oral  SpO2: 91% 94% 94% 93%  Weight:      Height:        PHYSICAL EXAM: Right upper extremity: Patient right arm swelling and ecchymosis are slowly improving.Marland Kitchen  He still has extensive ecchymosis in the upper arm and forearm.  Patient is neurovascularly intact.  He can flex and extend all 5 digits of the right hand.  His fingers are well-perfused and he has a palpable radial pulse.  There is no evidence of compartment syndrome of his upper arm or forearm.  The dorsal forearm has mild superficial skin tears/ruptured fracture blisters from the swelling.  These are covered with bandages.   LABS  Results for orders placed or performed during the hospital encounter of 10/18/21 (from the past 24 hour(s))  Glucose, capillary     Status: Abnormal   Collection Time: 10/25/21  5:38 PM  Result Value Ref Range   Glucose-Capillary 131 (H) 70 - 99 mg/dL  CBC     Status: Abnormal   Collection Time: 10/26/21  6:01 AM  Result Value Ref Range   WBC 12.9 (H) 4.0 - 10.5 K/uL   RBC 3.16 (L) 4.22 - 5.81 MIL/uL   Hemoglobin 10.4 (L) 13.0  - 17.0 g/dL   HCT 31.1 (L) 39.0 - 52.0 %   MCV 98.4 80.0 - 100.0 fL   MCH 32.9 26.0 - 34.0 pg   MCHC 33.4 30.0 - 36.0 g/dL   RDW 18.5 (H) 11.5 - 15.5 %   Platelets 552 (H) 150 - 400 K/uL   nRBC 0.5 (H) 0.0 - 0.2 %  Basic metabolic panel     Status: Abnormal   Collection Time: 10/26/21  6:01 AM  Result Value Ref Range   Sodium 131 (L) 135 - 145 mmol/L   Potassium 3.3 (L) 3.5 - 5.1 mmol/L   Chloride 95 (L) 98 - 111 mmol/L   CO2 24 22 - 32 mmol/L   Glucose, Bld 106 (H) 70 - 99 mg/dL   BUN 18 8 - 23 mg/dL   Creatinine, Ser 1.69 (H) 0.61 - 1.24 mg/dL   Calcium 8.5 (L) 8.9 - 10.3 mg/dL   GFR, Estimated 43 (L) >60 mL/min   Anion gap 12 5 - 15  Glucose, capillary     Status: Abnormal   Collection Time: 10/26/21  9:22 AM  Result Value Ref Range   Glucose-Capillary 125 (H) 70 - 99 mg/dL  Glucose, capillary     Status: Abnormal   Collection Time: 10/26/21 11:36 AM  Result Value Ref Range   Glucose-Capillary  111 (H) 70 - 99 mg/dL    No results found.  Assessment/Plan:     Principal Problem:   Rhabdomyolysis Active Problems:   AKI (acute kidney injury) ([place])   Metabolic acidosis   Shock ([place])  I discussed this case with Dr. Doreatha Martin and Dr. Marcelino Scot the orthopedic trauma specialist at [facility].  They have recommended an arthroplasty for this fracture.  They recommended Dr. Ophelia Charter.  I communicated with Dr. Griffin Basil today who is willing to to perform the patient's arthroplasty surgery.  Patient will be transferred to [facility] via a hospitalist to hospitalist transfer.  Dr. Griffin Basil has Mr. Rowand on the OR schedule tomorrow.  He should be n.p.o. after midnight and should not receive anticoagulation therapy tonight in preparation for possible surgery tomorrow.    Thornton Park , MD 10/26/2021, 3:25 PM

## 2021-10-26 NOTE — Plan of Care (Signed)
PMT note:  Request made by attending MD to call patient's sister -who would be his HPOA if he were not able to make decisions for himself. Attempted to call her x2 unsuccessfully.  Would recommend outpatient palliative to follow to further discuss [place].

## 2021-10-26 NOTE — Progress Notes (Signed)
Pharmacy Antibiotic Note  Edward Singleton is a 71 y.o. male with Streptococcus galloyticus bactermia  Pharmacy has been consulted for Ancef dosing.  Plan: Ancef 2 g IV q8h thru 11/03/21     Temp (24hrs), Avg:98.5 F (36.9 C), Min:97.6 F (36.4 C), Max:99.3 F (37.4 C)  Recent Labs  Lab 10/22/21 0444 10/23/21 0516 10/24/21 0456 10/25/21 0422 10/26/21 0601  WBC 22.8* 19.5* 19.1* 16.6* 12.9*  CREATININE 1.91* 1.65* 1.67* 1.66* 1.69*    Estimated Creatinine Clearance: 38.6 mL/min (A) (by C-G formula based on SCr of 1.69 mg/dL (H)).    No Known Allergies    Caryl Pina 10/26/2021 11:33 PM

## 2021-10-26 NOTE — NC FL2 (Signed)
[place] LEVEL OF CARE SCREENING TOOL     IDENTIFICATION  Patient Name: Edward Singleton Birthdate: 11/23/1950 Sex: male Admission Date (Current Location): 10/18/2021  [facility] and [place] Number:  Engineering geologist and Address:  [facility], [address] [place], [place] [address]      Provider Number: [id number]  Attending Physician Name and Address:  Dessa Phi, DO  Relative Name and Phone Number:       Current Level of Care: Hospital Recommended Level of Care: [place] Prior Approval Number:    Date Approved/Denied:   PASRR Number: [id number]  Discharge Plan: SNF    Current Diagnoses: Patient Active Problem List   Diagnosis Date Noted   Metabolic acidosis    Shock ([place])    Rhabdomyolysis 10/18/2021   Delayed emergence from anesthesia 03/18/2019   Hypokalemia 74/09/8785   Alcoholic ketoacidosis 76/72/0947   Alcohol withdrawal syndrome with complication ([place]) 09/62/8366   Diabetes ([place]) 02/21/2019   Left rotator cuff tear arthropathy 12/28/2015   Hyperlipidemia 12/24/2015   Tobacco abuse 11/06/2015   Mood disturbance 11/06/2015   CAD (coronary artery disease) 10/16/2015   PAD (peripheral artery disease) ([place]) 10/16/2015   Preventative health care 08/13/2015   Renal artery stenosis ([place]) 07/30/2015   AKI (acute kidney injury) ([place]) 07/30/2015   Kidney atrophy 07/30/2015   CKD (chronic kidney disease), stage III ([place]) 07/30/2015   Insomnia 07/22/2015   History of substance abuse ([place]) 07/15/2015   BPH (benign prostatic hyperplasia) 07/15/2015   Gout 07/15/2015   Kidney stones 05/11/2015   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[place]) 04/08/2015   HTN (hypertension) 11/17/2014   Alcohol abuse 11/17/2014    Orientation RESPIRATION BLADDER Height & Weight     Self  Normal Continent Weight: 145 lb 15.1 oz (66.2 kg) Height:  5\' 8"  (172.7 cm)  BEHAVIORAL SYMPTOMS/MOOD NEUROLOGICAL BOWEL NUTRITION STATUS       Incontinent Diet  AMBULATORY STATUS COMMUNICATION OF NEEDS Skin   Limited Assist Verbally Other (Comment) (open wound on right arm)                       Personal Care Assistance Level of Assistance  Bathing, Feeding, Dressing Bathing Assistance: Limited assistance Feeding assistance: Independent Dressing Assistance: Limited assistance     Functional Limitations Info  Sight, Hearing, Speech Sight Info: Adequate Hearing Info: Adequate Speech Info: Adequate    SPECIAL CARE FACTORS FREQUENCY  PT (By licensed PT), OT (By licensed OT)     PT Frequency: 5x OT Frequency: 5x            Contractures Contractures Info: Not present    Additional Factors Info  Code Status, Allergies Code Status Info: Full Code Allergies Info: no known allergies           Current Medications (10/26/2021):  This is the current hospital active medication list Current Facility-Administered Medications  Medication Dose Route Frequency Provider Last Rate Last Admin   0.9 %  sodium chloride infusion  250 mL Intravenous Continuous Duffy Bruce, MD   Stopped at 10/19/21 0149   0.9 %  sodium chloride infusion   Intravenous PRN Dessa Phi, DO       albuterol (PROVENTIL) (2.5 MG/3ML) 0.083% nebulizer solution 2.5 mg  2.5 mg Nebulization Q2H PRN Dessa Phi, DO   2.5 mg at 10/22/21 1527   ceFAZolin (ANCEF) IVPB 2g/100 mL premix  2 g Intravenous Q8H Tsosie Billing, MD 200 mL/hr at  10/26/21 0533 2 g at 10/26/21 0533   Chlorhexidine Gluconate Cloth 2 % PADS 6 each  6 each Topical Q0600 Flora Lipps, MD   6 each at 10/26/21 0921   docusate sodium (COLACE) capsule 100 mg  100 mg Oral BID PRN Rust-Chester, Huel Cote, NP       folic acid (FOLVITE) tablet 1 mg  1 mg Oral Daily Rust-Chester, Britton L, NP   1 mg at 10/26/21 0919   HYDROmorphone (DILAUDID) tablet 1 mg  1 mg Oral Q3H PRN Dessa Phi, DO   1 mg at 10/26/21 0214   insulin aspart (novoLOG) injection 0-15 Units  0-15 Units  Subcutaneous TID WC Dessa Phi, DO   2 Units at 10/26/21 2130   multivitamin with minerals tablet 1 tablet  1 tablet Oral Daily Rust-Chester, Huel Cote, NP   1 tablet at 10/26/21 0919   nicotine (NICODERM CQ - dosed in mg/24 hours) patch 14 mg  14 mg Transdermal Daily Dessa Phi, DO   14 mg at 10/26/21 0918   ondansetron (ZOFRAN) injection 4 mg  4 mg Intravenous Q6H PRN Dessa Phi, DO       oxyCODONE-acetaminophen (PERCOCET/ROXICET) 5-325 MG per tablet 1-2 tablet  1-2 tablet Oral Q4H PRN Dessa Phi, DO   2 tablet at 10/26/21 0423   pantoprazole (PROTONIX) EC tablet 40 mg  40 mg Oral QHS Flora Lipps, MD   40 mg at 10/25/21 2036   polyethylene glycol (MIRALAX / GLYCOLAX) packet 17 g  17 g Oral Daily PRN Rust-Chester, Huel Cote, NP       spiritus frumenti (ethyl alcohol) solution 1 each  1 each Oral Daily Dessa Phi, DO   1 each at 10/26/21 0919   thiamine tablet 100 mg  100 mg Oral Daily Rust-Chester, Britton L, NP   100 mg at 10/26/21 8657   Or   thiamine (B-1) injection 100 mg  100 mg Intravenous Daily Rust-Chester, Huel Cote, NP         Discharge Medications: Please see discharge summary for a list of discharge medications.  Relevant Imaging Results:  Relevant Lab Results:   Additional Information [id number]  Eileen [place], LCSW

## 2021-10-26 NOTE — TOC Progression Note (Signed)
Transition of Care [facility]) - Progression Note    Patient Details  Name: Edward Singleton MRN: [id number] Date of Birth: 04/26/1951  [facility] CM/SW Contact  Eileen [place], LCSW Phone Number: 10/26/2021, 1:34 PM  Clinical Narrative:   CSW faxed screening packet to [facility]. However, now plan is for pt to transfer to [facility].    Expected Discharge Plan: [place] Barriers to Discharge: Continued Medical Work up  Expected Discharge Plan and Services Expected Discharge Plan: [place] In-house Referral: Clinical Social Work   Post Acute Care Choice: [place] Living arrangements for the past 2 months: Single Family Home Expected Discharge Date: 10/26/21                                     Social Determinants of Health (SDOH) Interventions    Readmission Risk Interventions Readmission Risk Prevention Plan 03/15/2019  Transportation Screening Complete  PCP or Specialist Appt within 3-5 Days Complete  HRI or [place] Complete  Social Work Consult for [place] Planning/Counseling Complete  Palliative Care Screening Complete  Medication Review Press photographer) Complete  Some recent data might be hidden

## 2021-10-26 NOTE — Progress Notes (Signed)
°   10/26/21 2252  Assess: MEWS Score  Temp 99.3 F (37.4 C)  BP 128/71  Pulse Rate (!) 114  Resp 18  Level of Consciousness Alert  SpO2 95 %  O2 Device Nasal Cannula  O2 Flow Rate (L/min) 2 L/min  Assess: MEWS Score  MEWS Temp 0  MEWS Systolic 0  MEWS Pulse 2  MEWS RR 0  MEWS LOC 0  MEWS Score 2  MEWS Score Color Yellow  Assess: if the MEWS score is Yellow or Red  Were vital signs taken at a resting state? Yes  Focused Assessment Change from prior assessment (see assessment flowsheet)  Early Detection of Sepsis Score *See Row Information* Low  MEWS guidelines implemented *See Row Information* Yes  Treat  Pain Scale 0-10  Pain Score 9  Pain Type Acute pain  Pain Location Shoulder  Pain Orientation Right  Pain Descriptors / Indicators Aching  Pain Frequency Constant  Pain Onset On-going  Pain Intervention(s) MD notified (Comment)  Take Vital Signs  Increase Vital Sign Frequency  Yellow: Q 2hr X 2 then Q 4hr X 2, if remains yellow, continue Q 4hrs  Escalate  MEWS: Escalate Yellow: discuss with charge nurse/RN and consider discussing with provider and RRT  Notify: Charge Nurse/RN  Name of Charge Nurse/RN Notified Blanch Media RN  Date Charge Nurse/RN Notified 10/26/21  Time Charge Nurse/RN Notified 2253  Notify: Provider  Provider Name/Title Mitzi Hansen MD  Date Provider Notified 10/26/21  Time Provider Notified 2250  Notification Type Page  Notification Reason Change in status  Provider response See new orders  Date of Provider Response 10/26/21  Time of Provider Response 2253  Document  Progress note created (see row info) Yes

## 2021-10-26 NOTE — Progress Notes (Signed)
Daily Progress Note   Patient Name: Edward Singleton       Date: 10/26/2021 DOB: Jul 05, 1951  Age: 71 y.o. MRN#: [id number] Attending Physician: Dessa Phi, DO Primary Care Physician: Windy Fast, MD Admit Date: 10/18/2021  Reason for Consultation/Follow-up: Establishing goals of care  Subjective: Patient is resting in bed. Called sister as per MD request. She states she is his HPOA if he is unable to make decisions for himself. Sister discusses his daily status and choices. She tells me he has always wanted CPR. She states she spoke with him a week ago after admission when he was still somewhat confused, and discussed what CPR would mean. She states at that time he was clear he would not want CPR. She states she did not change the code status at that time. She is aware he is currently a full code. This is as per his decision yesterday during [place] conversation. Staff is currently working on D/C to [facility]. Discussed that outpatient palliative could speak with them to determine his wishes on care.   Length of Stay: 8  Current Medications: Scheduled Meds:   Chlorhexidine Gluconate Cloth  6 each Topical E7209   folic acid  1 mg Oral Daily   insulin aspart  0-15 Units Subcutaneous TID WC   multivitamin with minerals  1 tablet Oral Daily   nicotine  14 mg Transdermal Daily   pantoprazole  40 mg Oral QHS   spiritus frumenti  1 each Oral Daily   thiamine  100 mg Oral Daily   Or   thiamine  100 mg Intravenous Daily    Continuous Infusions:  sodium chloride Stopped (10/19/21 0149)   sodium chloride      ceFAZolin (ANCEF) IV Stopped (10/26/21 0603)    PRN Meds: sodium chloride, albuterol, docusate sodium, HYDROmorphone, ondansetron (ZOFRAN) IV, oxyCODONE-acetaminophen, polyethylene  glycol  Physical Exam Pulmonary:     Effort: Pulmonary effort is normal.  Neurological:     Mental Status: He is alert.            Vital Signs: BP 139/79 (BP Location: Right Leg)    Pulse 94    Temp 97.6 F (36.4 C) (Oral)    Resp 18    Ht 5\' 8"  (1.727 m)    Wt 66.2 kg    SpO2 93%  BMI 22.19 kg/m  SpO2: SpO2: 93 % O2 Device: O2 Device: Room Air O2 Flow Rate: O2 Flow Rate (L/min): 2 L/min  Intake/output summary:  Intake/Output Summary (Last 24 hours) at 10/26/2021 1302 Last data filed at 10/26/2021 1249 Gross per 24 hour  Intake 800 ml  Output 1025 ml  Net -225 ml   LBM: Last BM Date: 10/23/21 Baseline Weight: Weight: 72.6 kg Most recent weight: Weight: 66.2 kg    Patient Active Problem List   Diagnosis Date Noted   Metabolic acidosis    Shock ([place])    Rhabdomyolysis 10/18/2021   Delayed emergence from anesthesia 03/18/2019   Hypokalemia 56/38/7564   Alcoholic ketoacidosis 33/29/5188   Alcohol withdrawal syndrome with complication ([place]) 41/66/0630   Diabetes ([place]) 02/21/2019   Left rotator cuff tear arthropathy 12/28/2015   Hyperlipidemia 12/24/2015   Tobacco abuse 11/06/2015   Mood disturbance 11/06/2015   CAD (coronary artery disease) 10/16/2015   PAD (peripheral artery disease) ([place]) 10/16/2015   Preventative health care 08/13/2015   Renal artery stenosis ([place]) 07/30/2015   AKI (acute kidney injury) ([place]) 07/30/2015   Kidney atrophy 07/30/2015   CKD (chronic kidney disease), stage III ([place]) 07/30/2015   Insomnia 07/22/2015   History of substance abuse ([place]) 07/15/2015   BPH (benign prostatic hyperplasia) 07/15/2015   Gout 07/15/2015   Kidney stones 05/11/2015   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   Code Status:    Code Status Orders  (From admission,  onward)           Start     Ordered   10/18/21 2004  Full code  Continuous        10/18/21 2003           Code Status History     Date Active Date Inactive Code Status Order ID Comments User Context   05/13/2021 1610 05/14/2021 0017 Full Code [id number]  Nena Polio, MD ED   03/14/2019 1943 03/19/2019 2210 Full Code [id number]  Saundra Shelling, MD Inpatient   02/21/2019 0321 02/23/2019 1936 Full Code [id number]  Lance Coon, MD Inpatient   02/21/2019 0045 02/21/2019 0320 Full Code [id number]  Gregor Hams, MD ED   07/23/2015 0231 07/27/2015 2114 Full Code [id number]  Fritzi Mandes, MD Inpatient   11/25/2014 1510 12/08/2014 1650 Full Code [id number]  Cathlyn Parsons, PA-C Inpatient   11/25/2014 1510 11/25/2014 1510 Full Code [id number]  Cathlyn Parsons, PA-C Inpatient   11/17/2014 1518 11/25/2014 1510 Full Code [id number]  Lisette Abu, PA-C Inpatient       Thank you for allowing the Palliative Medicine Team to assist in the care of this patient.       Total Time 25 min Prolonged Time Billed  no       Greater than 50%  of this time was spent counseling and coordinating care related to the above assessment and plan.  Asencion Gowda, NP  Please contact Palliative Medicine Team phone at [phone] for questions and concerns.

## 2021-10-27 ENCOUNTER — Encounter (HOSPITAL_COMMUNITY): Payer: Self-pay | Admitting: Family Medicine

## 2021-10-27 ENCOUNTER — Inpatient Hospital Stay (HOSPITAL_COMMUNITY): Admission: RE | Admit: 2021-10-27 | Payer: Medicare Other | Source: Home / Self Care | Admitting: Orthopaedic Surgery

## 2021-10-27 ENCOUNTER — Inpatient Hospital Stay (HOSPITAL_COMMUNITY): Payer: No Typology Code available for payment source | Admitting: Certified Registered Nurse Anesthetist

## 2021-10-27 ENCOUNTER — Inpatient Hospital Stay (HOSPITAL_COMMUNITY): Payer: No Typology Code available for payment source

## 2021-10-27 ENCOUNTER — Encounter (HOSPITAL_COMMUNITY)
Admission: AD | Disposition: A | Discharge status: Skilled Nursing Facility | Payer: Self-pay | Source: Other Acute Inpatient Hospital | Attending: Internal Medicine

## 2021-10-27 ENCOUNTER — Other Ambulatory Visit: Payer: Self-pay

## 2021-10-27 DIAGNOSIS — N179 Acute kidney failure, unspecified: Secondary | ICD-10-CM

## 2021-10-27 HISTORY — PX: REVERSE SHOULDER ARTHROPLASTY: SHX5054

## 2021-10-27 LAB — GLUCOSE, CAPILLARY
Glucose-Capillary: 105 mg/dL — ABNORMAL HIGH (ref 70–99)
Glucose-Capillary: 113 mg/dL — ABNORMAL HIGH (ref 70–99)
Glucose-Capillary: 142 mg/dL — ABNORMAL HIGH (ref 70–99)
Glucose-Capillary: 142 mg/dL — ABNORMAL HIGH (ref 70–99)
Glucose-Capillary: 154 mg/dL — ABNORMAL HIGH (ref 70–99)
Glucose-Capillary: 169 mg/dL — ABNORMAL HIGH (ref 70–99)
Glucose-Capillary: 92 mg/dL (ref 70–99)
Glucose-Capillary: 94 mg/dL (ref 70–99)

## 2021-10-27 LAB — BASIC METABOLIC PANEL
Anion gap: 12 (ref 5–15)
BUN: 19 mg/dL (ref 8–23)
CO2: 21 mmol/L — ABNORMAL LOW (ref 22–32)
Calcium: 8.5 mg/dL — ABNORMAL LOW (ref 8.9–10.3)
Chloride: 99 mmol/L (ref 98–111)
Creatinine, Ser: 1.95 mg/dL — ABNORMAL HIGH (ref 0.61–1.24)
GFR, Estimated: 37 mL/min — ABNORMAL LOW (ref >60)
Glucose, Bld: 100 mg/dL — ABNORMAL HIGH (ref 70–99)
Potassium: 3.9 mmol/L (ref 3.5–5.1)
Sodium: 132 mmol/L — ABNORMAL LOW (ref 135–145)

## 2021-10-27 LAB — CBC
HCT: 30.5 % — ABNORMAL LOW (ref 39.0–52.0)
Hemoglobin: 10.3 g/dL — ABNORMAL LOW (ref 13.0–17.0)
MCH: 34.1 pg — ABNORMAL HIGH (ref 26.0–34.0)
MCHC: 33.8 g/dL (ref 30.0–36.0)
MCV: 101 fL — ABNORMAL HIGH (ref 80.0–100.0)
Platelets: 596 10*3/uL — ABNORMAL HIGH (ref 150–400)
RBC: 3.02 MIL/uL — ABNORMAL LOW (ref 4.22–5.81)
RDW: 18.9 % — ABNORMAL HIGH (ref 11.5–15.5)
WBC: 15.5 10*3/uL — ABNORMAL HIGH (ref 4.0–10.5)
nRBC: 0.3 % — ABNORMAL HIGH (ref 0.0–0.2)

## 2021-10-27 LAB — TYPE AND SCREEN
ABO/RH(D): A POS
Antibody Screen: NEGATIVE

## 2021-10-27 LAB — MRSA NEXT GEN BY PCR, NASAL: MRSA by PCR Next Gen: NOT DETECTED

## 2021-10-27 LAB — PHOSPHORUS: Phosphorus: 2.9 mg/dL (ref 2.5–4.6)

## 2021-10-27 LAB — MAGNESIUM: Magnesium: 1.7 mg/dL (ref 1.7–2.4)

## 2021-10-27 SURGERY — ARTHROPLASTY, SHOULDER, TOTAL, REVERSE
Anesthesia: General | Site: Shoulder | Laterality: Right

## 2021-10-27 MED ORDER — METOCLOPRAMIDE HCL 5 MG PO TABS: 5.0000 mg | ORAL_TABLET | Freq: Three times a day (TID) | ORAL | Status: DC | PRN

## 2021-10-27 MED ORDER — VANCOMYCIN HCL 1000 MG IV SOLR
INTRAVENOUS | Status: AC
  Filled 2021-10-27: qty 20

## 2021-10-27 MED ORDER — LORAZEPAM 2 MG/ML IJ SOLN
1.0000 mg | INTRAMUSCULAR | Status: AC | PRN
  Administered 2021-10-28: 2 mg via INTRAVENOUS
  Administered 2021-10-28 (×2): 1 mg via INTRAVENOUS
  Filled 2021-10-27: qty 2
  Filled 2021-10-27 (×3): qty 1

## 2021-10-27 MED ORDER — POVIDONE-IODINE 10 % EX SWAB
2.0000 "application " | Freq: Once | CUTANEOUS | Status: AC
  Administered 2021-10-27: 2 via TOPICAL

## 2021-10-27 MED ORDER — PHENYLEPHRINE 40 MCG/ML (10ML) SYRINGE FOR IV PUSH (FOR BLOOD PRESSURE SUPPORT)
PREFILLED_SYRINGE | INTRAVENOUS | Status: DC | PRN
  Administered 2021-10-27: 80 ug via INTRAVENOUS

## 2021-10-27 MED ORDER — ONDANSETRON HCL 4 MG PO TABS: 4.0000 mg | ORAL_TABLET | Freq: Four times a day (QID) | ORAL | Status: DC | PRN

## 2021-10-27 MED ORDER — DOCUSATE SODIUM 100 MG PO CAPS
100.0000 mg | ORAL_CAPSULE | Freq: Two times a day (BID) | ORAL | Status: DC
  Administered 2021-10-27 – 2021-10-31 (×8): 100 mg via ORAL
  Filled 2021-10-27 (×8): qty 1

## 2021-10-27 MED ORDER — METHOCARBAMOL 1000 MG/10ML IJ SOLN
500.0000 mg | Freq: Four times a day (QID) | INTRAVENOUS | Status: DC | PRN
  Filled 2021-10-27: qty 5

## 2021-10-27 MED ORDER — LORAZEPAM 1 MG PO TABS
1.0000 mg | ORAL_TABLET | ORAL | Status: AC | PRN
  Administered 2021-10-27 – 2021-10-28 (×2): 1 mg via ORAL
  Administered 2021-10-28: 2 mg via ORAL
  Administered 2021-10-28: 1 mg via ORAL
  Filled 2021-10-27 (×2): qty 1
  Filled 2021-10-27: qty 2
  Filled 2021-10-27: qty 1

## 2021-10-27 MED ORDER — BUPIVACAINE-EPINEPHRINE (PF) 0.5% -1:200000 IJ SOLN
INTRAMUSCULAR | Status: DC | PRN
  Administered 2021-10-27: 15 mL via PERINEURAL

## 2021-10-27 MED ORDER — LACTATED RINGERS IV SOLN: INTRAVENOUS | Status: DC

## 2021-10-27 MED ORDER — VANCOMYCIN HCL 1000 MG IV SOLR
INTRAVENOUS | Status: DC | PRN
  Administered 2021-10-27: 1000 mg via TOPICAL

## 2021-10-27 MED ORDER — ONDANSETRON HCL 4 MG/2ML IJ SOLN
INTRAMUSCULAR | Status: DC | PRN
Start: 2021-10-27 — End: 2021-10-27
  Administered 2021-10-27: 4 mg via INTRAVENOUS

## 2021-10-27 MED ORDER — CEFAZOLIN SODIUM-DEXTROSE 2-4 GM/100ML-% IV SOLN: 2.0000 g | INTRAVENOUS | Status: DC

## 2021-10-27 MED ORDER — OXYCODONE HCL 5 MG PO TABS
10.0000 mg | ORAL_TABLET | ORAL | Status: DC | PRN
  Administered 2021-10-28 – 2021-10-29 (×2): 10 mg via ORAL
  Filled 2021-10-27: qty 2

## 2021-10-27 MED ORDER — LIDOCAINE 2% (20 MG/ML) 5 ML SYRINGE
INTRAMUSCULAR | Status: DC | PRN
Start: 2021-10-27 — End: 2021-10-27
  Administered 2021-10-27: 40 mg via INTRAVENOUS

## 2021-10-27 MED ORDER — TOBRAMYCIN SULFATE 1.2 G IJ SOLR
INTRAMUSCULAR | Status: DC | PRN
  Administered 2021-10-27: 1.2 g via TOPICAL

## 2021-10-27 MED ORDER — PHENOL 1.4 % MT LIQD: 1.0000 | OROMUCOSAL | Status: DC | PRN

## 2021-10-27 MED ORDER — BISACODYL 10 MG RE SUPP: 10.0000 mg | Freq: Every day | RECTAL | Status: DC | PRN

## 2021-10-27 MED ORDER — CHLORHEXIDINE GLUCONATE 4 % EX LIQD
60.0000 mL | Freq: Once | CUTANEOUS | Status: AC
  Administered 2021-10-27: 4 via TOPICAL
  Filled 2021-10-27: qty 60

## 2021-10-27 MED ORDER — METHOCARBAMOL 500 MG PO TABS
500.0000 mg | ORAL_TABLET | Freq: Four times a day (QID) | ORAL | Status: DC | PRN
  Administered 2021-10-29 – 2021-11-02 (×8): 500 mg via ORAL
  Filled 2021-10-27 (×8): qty 1

## 2021-10-27 MED ORDER — TRANEXAMIC ACID-NACL 1000-0.7 MG/100ML-% IV SOLN
INTRAVENOUS | Status: DC | PRN
  Administered 2021-10-27: 1000 mg via INTRAVENOUS

## 2021-10-27 MED ORDER — CHLORHEXIDINE GLUCONATE 0.12 % MT SOLN: 15.0000 mL | Freq: Once | OROMUCOSAL | Status: AC

## 2021-10-27 MED ORDER — DEXAMETHASONE SODIUM PHOSPHATE 10 MG/ML IJ SOLN
INTRAMUSCULAR | Status: AC
  Filled 2021-10-27: qty 1

## 2021-10-27 MED ORDER — POLYETHYLENE GLYCOL 3350 17 G PO PACK: 17.0000 g | PACK | Freq: Every day | ORAL | Status: DC | PRN

## 2021-10-27 MED ORDER — DIPHENHYDRAMINE HCL 12.5 MG/5ML PO ELIX
12.5000 mg | ORAL_SOLUTION | ORAL | Status: DC | PRN
  Administered 2021-10-28 – 2021-10-29 (×3): 25 mg via ORAL
  Filled 2021-10-27 (×3): qty 10

## 2021-10-27 MED ORDER — 0.9 % SODIUM CHLORIDE (POUR BTL) OPTIME
TOPICAL | Status: DC | PRN
  Administered 2021-10-27: 1000 mL

## 2021-10-27 MED ORDER — CHLORHEXIDINE GLUCONATE CLOTH 2 % EX PADS
6.0000 | MEDICATED_PAD | Freq: Every day | CUTANEOUS | Status: DC
  Administered 2021-10-28 – 2021-11-02 (×6): 6 via TOPICAL

## 2021-10-27 MED ORDER — ACETAMINOPHEN 325 MG PO TABS
650.0000 mg | ORAL_TABLET | Freq: Three times a day (TID) | ORAL | Status: AC
  Administered 2021-10-27 – 2021-10-28 (×3): 650 mg via ORAL
  Filled 2021-10-27 (×3): qty 2

## 2021-10-27 MED ORDER — BUPIVACAINE-EPINEPHRINE (PF) 0.25% -1:200000 IJ SOLN
INTRAMUSCULAR | Status: AC
  Filled 2021-10-27: qty 30

## 2021-10-27 MED ORDER — ROCURONIUM BROMIDE 10 MG/ML (PF) SYRINGE
PREFILLED_SYRINGE | INTRAVENOUS | Status: DC | PRN
Start: 2021-10-27 — End: 2021-10-27
  Administered 2021-10-27: 20 mg via INTRAVENOUS
  Administered 2021-10-27: 50 mg via INTRAVENOUS

## 2021-10-27 MED ORDER — MENTHOL 3 MG MT LOZG: 1.0000 | LOZENGE | OROMUCOSAL | Status: DC | PRN

## 2021-10-27 MED ORDER — FENTANYL CITRATE (PF) 100 MCG/2ML IJ SOLN: 100.0000 ug | Freq: Once | INTRAMUSCULAR | Status: AC

## 2021-10-27 MED ORDER — MIDAZOLAM HCL 2 MG/2ML IJ SOLN
INTRAMUSCULAR | Status: AC
  Filled 2021-10-27: qty 2

## 2021-10-27 MED ORDER — PROPOFOL 10 MG/ML IV BOLUS
INTRAVENOUS | Status: AC
  Filled 2021-10-27: qty 20

## 2021-10-27 MED ORDER — ORAL CARE MOUTH RINSE: 15.0000 mL | Freq: Once | OROMUCOSAL | Status: AC

## 2021-10-27 MED ORDER — OXYCODONE HCL 5 MG PO TABS
5.0000 mg | ORAL_TABLET | ORAL | Status: DC | PRN
  Filled 2021-10-27 (×2): qty 1

## 2021-10-27 MED ORDER — ORAL CARE MOUTH RINSE: 15.0000 mL | Freq: Once | OROMUCOSAL | Status: DC

## 2021-10-27 MED ORDER — METOCLOPRAMIDE HCL 5 MG/ML IJ SOLN: 5.0000 mg | Freq: Three times a day (TID) | INTRAMUSCULAR | Status: DC | PRN

## 2021-10-27 MED ORDER — ROCURONIUM BROMIDE 10 MG/ML (PF) SYRINGE
PREFILLED_SYRINGE | INTRAVENOUS | Status: AC
  Filled 2021-10-27: qty 10

## 2021-10-27 MED ORDER — SODIUM CHLORIDE 0.9 % IR SOLN
Status: DC | PRN
  Administered 2021-10-27: 3000 mL

## 2021-10-27 MED ORDER — FENTANYL CITRATE (PF) 250 MCG/5ML IJ SOLN
INTRAMUSCULAR | Status: DC | PRN
Start: 2021-10-27 — End: 2021-10-27
  Administered 2021-10-27: 50 ug via INTRAVENOUS

## 2021-10-27 MED ORDER — PROPOFOL 10 MG/ML IV BOLUS
INTRAVENOUS | Status: DC | PRN
  Administered 2021-10-27: 130 mg via INTRAVENOUS

## 2021-10-27 MED ORDER — FENTANYL CITRATE (PF) 250 MCG/5ML IJ SOLN
INTRAMUSCULAR | Status: AC
  Filled 2021-10-27: qty 5

## 2021-10-27 MED ORDER — TOBRAMYCIN SULFATE 1.2 G IJ SOLR
INTRAMUSCULAR | Status: AC
  Filled 2021-10-27: qty 1.2

## 2021-10-27 MED ORDER — ONDANSETRON HCL 4 MG/2ML IJ SOLN
INTRAMUSCULAR | Status: AC
  Filled 2021-10-27: qty 2

## 2021-10-27 MED ORDER — SODIUM CHLORIDE 0.9 % IV SOLN: INTRAVENOUS | Status: AC

## 2021-10-27 MED ORDER — PHENYLEPHRINE 40 MCG/ML (10ML) SYRINGE FOR IV PUSH (FOR BLOOD PRESSURE SUPPORT)
PREFILLED_SYRINGE | INTRAVENOUS | Status: AC
  Filled 2021-10-27: qty 10

## 2021-10-27 MED ORDER — PHENYLEPHRINE HCL-NACL 20-0.9 MG/250ML-% IV SOLN
INTRAVENOUS | Status: DC | PRN
  Administered 2021-10-27: 25 ug/min via INTRAVENOUS

## 2021-10-27 MED ORDER — FENTANYL CITRATE (PF) 100 MCG/2ML IJ SOLN
INTRAMUSCULAR | Status: AC
  Administered 2021-10-27: 100 ug via INTRAVENOUS
  Filled 2021-10-27: qty 2

## 2021-10-27 MED ORDER — FENTANYL CITRATE (PF) 100 MCG/2ML IJ SOLN
25.0000 ug | INTRAMUSCULAR | Status: DC | PRN
  Administered 2021-10-27: 25 ug via INTRAVENOUS

## 2021-10-27 MED ORDER — FENTANYL CITRATE (PF) 100 MCG/2ML IJ SOLN
INTRAMUSCULAR | Status: AC
  Filled 2021-10-27: qty 2

## 2021-10-27 MED ORDER — TRANEXAMIC ACID-NACL 1000-0.7 MG/100ML-% IV SOLN
INTRAVENOUS | Status: AC
  Filled 2021-10-27: qty 100

## 2021-10-27 MED ORDER — BUPIVACAINE LIPOSOME 1.3 % IJ SUSP
INTRAMUSCULAR | Status: DC | PRN
  Administered 2021-10-27: 10 mL via PERINEURAL

## 2021-10-27 MED ORDER — CHLORHEXIDINE GLUCONATE 0.12 % MT SOLN
OROMUCOSAL | Status: AC
  Administered 2021-10-27: 15 mL via OROMUCOSAL
  Filled 2021-10-27: qty 15

## 2021-10-27 MED ORDER — CHLORHEXIDINE GLUCONATE 0.12 % MT SOLN: 15.0000 mL | Freq: Once | OROMUCOSAL | Status: DC

## 2021-10-27 MED ORDER — LIDOCAINE 2% (20 MG/ML) 5 ML SYRINGE
INTRAMUSCULAR | Status: AC
  Filled 2021-10-27: qty 5

## 2021-10-27 MED ORDER — SUGAMMADEX SODIUM 200 MG/2ML IV SOLN
INTRAVENOUS | Status: DC | PRN
  Administered 2021-10-27: 140 mg via INTRAVENOUS

## 2021-10-27 MED ORDER — ONDANSETRON HCL 4 MG/2ML IJ SOLN: 4.0000 mg | Freq: Four times a day (QID) | INTRAMUSCULAR | Status: DC | PRN

## 2021-10-27 MED ORDER — ACETAMINOPHEN 500 MG PO TABS
1000.0000 mg | ORAL_TABLET | Freq: Once | ORAL | Status: AC
  Administered 2021-10-27: 1000 mg via ORAL
  Filled 2021-10-27: qty 2

## 2021-10-27 MED ORDER — HYDROMORPHONE HCL 1 MG/ML IJ SOLN: 0.5000 mg | INTRAMUSCULAR | Status: DC | PRN

## 2021-10-27 MED ORDER — DEXAMETHASONE SODIUM PHOSPHATE 10 MG/ML IJ SOLN
INTRAMUSCULAR | Status: DC | PRN
Start: 2021-10-27 — End: 2021-10-27
  Administered 2021-10-27: 8 mg via INTRAVENOUS

## 2021-10-27 SURGICAL SUPPLY — 84 items
AID PSTN UNV HD RSTRNT DISP (MISCELLANEOUS) ×1
APL PRP STRL LF DISP 70% ISPRP (MISCELLANEOUS) ×2
BASEPLATE GLENOID STD REV 42 (Joint) ×2 IMPLANT
BASEPLATE GLENOSPHERE 25 STD (Miscellaneous) ×1 IMPLANT
BASEPLATE GLENOSPHERE 25MM STD (Miscellaneous) ×1 IMPLANT
BLADE SAW SAG 73X25 THK (BLADE) ×2
BLADE SAW SGTL 73X25 THK (BLADE) ×1 IMPLANT
BSPLAT GLND STD 25 RVRS SHLDR (Miscellaneous) ×1 IMPLANT
CANISTER WOUND CARE 500ML ATS (WOUND CARE) ×2 IMPLANT
CAP LOCKING COCR (Cap) ×2 IMPLANT
CHLORAPREP W/TINT 26 (MISCELLANEOUS) ×6 IMPLANT
CLOSURE WOUND 1/2 X4 (GAUZE/BANDAGES/DRESSINGS)
CNTNR URN SCR LID CUP LEK RST (MISCELLANEOUS) IMPLANT
CONT SPEC 4OZ STRL OR WHT (MISCELLANEOUS) ×3
COOLER ICEMAN CLASSIC (MISCELLANEOUS) ×2 IMPLANT
COVER SURGICAL LIGHT HANDLE (MISCELLANEOUS) ×3 IMPLANT
DRAPE HALF SHEET 40X57 (DRAPES) ×3 IMPLANT
DRAPE INCISE IOBAN 66X45 STRL (DRAPES) ×6 IMPLANT
DRAPE ORTHO SPLIT 77X108 STRL (DRAPES) ×6
DRAPE SURG ORHT 6 SPLT 77X108 (DRAPES) ×2 IMPLANT
DRAPE SWITCH (DRAPES) ×3 IMPLANT
DRAPE U-SHAPE 47X51 STRL (DRAPES) IMPLANT
DRESSING PEEL AND PLAC PRVNA20 (GAUZE/BANDAGES/DRESSINGS) IMPLANT
DRSG AQUACEL AG ADV 3.5X 6 (GAUZE/BANDAGES/DRESSINGS) ×1 IMPLANT
DRSG PEEL AND PLACE PREVENA 20 (GAUZE/BANDAGES/DRESSINGS) ×3
ELECT BLADE 4.0 EZ CLEAN MEGAD (MISCELLANEOUS) ×3
ELECT REM PT RETURN 9FT ADLT (ELECTROSURGICAL) ×3
ELECTRODE BLDE 4.0 EZ CLN MEGD (MISCELLANEOUS) IMPLANT
ELECTRODE REM PT RTRN 9FT ADLT (ELECTROSURGICAL) ×1 IMPLANT
FACESHIELD WRAPAROUND (MASK) ×6 IMPLANT
FACESHIELD WRAPAROUND OR TEAM (MASK) IMPLANT
FIBERTAPE CERCLAGE TLINK SUT (SUTURE) ×4 IMPLANT
GLOVE SRG 8 PF TXTR STRL LF DI (GLOVE) ×1 IMPLANT
GLOVE SURG ENC MOIS LTX SZ6.5 (GLOVE) ×3 IMPLANT
GLOVE SURG LTX SZ8 (GLOVE) ×6 IMPLANT
GLOVE SURG UNDER LTX SZ6.5 (GLOVE) ×3 IMPLANT
GLOVE SURG UNDER POLY LF SZ8 (GLOVE) ×3
GOWN STRL REUS W/ TWL LRG LVL3 (GOWN DISPOSABLE) ×1 IMPLANT
GOWN STRL REUS W/ TWL XL LVL3 (GOWN DISPOSABLE) IMPLANT
GOWN STRL REUS W/TWL LRG LVL3 (GOWN DISPOSABLE) ×3
GOWN STRL REUS W/TWL XL LVL3 (GOWN DISPOSABLE)
GUIDEWIRE GLENOID 2.5X220 (WIRE) ×2 IMPLANT
HANDPIECE INTERPULSE COAX TIP (DISPOSABLE) ×3
IMPL PROX BODY PTC 15X132 (Stem) IMPLANT
IMPL REVERSE SHOULDER 0X3.5 (Shoulder) IMPLANT
IMPLANT PROX BODY PTC 15X132 (Stem) ×3 IMPLANT
IMPLANT REVERSE SHOULDER 0X3.5 (Shoulder) ×3 IMPLANT
INSERT SHLD REV 42X6 ANGLE B (Insert) ×2 IMPLANT
KIT BASIN OR (CUSTOM PROCEDURE TRAY) ×3 IMPLANT
KIT STABILIZATION SHOULDER (MISCELLANEOUS) ×3 IMPLANT
KIT TURNOVER KIT B (KITS) ×3 IMPLANT
MANIFOLD NEPTUNE II (INSTRUMENTS) ×3 IMPLANT
NDL HYPO 25GX1X1/2 BEV (NEEDLE) IMPLANT
NDL MAYO TROCAR (NEEDLE) IMPLANT
NEEDLE HYPO 25GX1X1/2 BEV (NEEDLE) IMPLANT
NEEDLE MAYO TROCAR (NEEDLE) ×3 IMPLANT
NS IRRIG 1000ML POUR BTL (IV SOLUTION) ×3 IMPLANT
PACK SHOULDER (CUSTOM PROCEDURE TRAY) ×3 IMPLANT
PAD ARMBOARD 7.5X6 YLW CONV (MISCELLANEOUS) ×6 IMPLANT
PAD COLD SHLDR WRAP-ON (PAD) ×2 IMPLANT
RESTRAINT HEAD UNIVERSAL NS (MISCELLANEOUS) ×3 IMPLANT
SCREW 5.0X38 SMALL F/PERFORM (Screw) ×2 IMPLANT
SCREW 5.5X22 (Screw) ×2 IMPLANT
SCREW ASSEMBLY COCR TYPE 0 (Screw) ×2 IMPLANT
SCREW BONE 6.5X40 SM (Screw) ×2 IMPLANT
SET HNDPC FAN SPRY TIP SCT (DISPOSABLE) ×1 IMPLANT
SLING ARM IMMOBILIZER LRG (SOFTGOODS) ×3 IMPLANT
SPONGE T-LAP 18X18 ~~LOC~~+RFID (SPONGE) ×3 IMPLANT
STEM PTC DISTAL 15X130 (Joint) ×2 IMPLANT
STRIP CLOSURE SKIN 1/2X4 (GAUZE/BANDAGES/DRESSINGS) ×1 IMPLANT
SUCTION FRAZIER HANDLE 10FR (MISCELLANEOUS) ×3
SUCTION TUBE FRAZIER 10FR DISP (MISCELLANEOUS) IMPLANT
SUT ETHIBOND 2 V 37 (SUTURE) ×3 IMPLANT
SUT ETHIBOND NAB CT1 #1 30IN (SUTURE) ×3 IMPLANT
SUT ETHILON 2 0 PSLX (SUTURE) ×4 IMPLANT
SUT FIBERWIRE #5 38 CONV NDL (SUTURE) ×18
SUT MNCRL AB 3-0 PS2 18 (SUTURE) ×3 IMPLANT
SUT VIC AB 2-0 CT1 27 (SUTURE) ×3
SUT VIC AB 2-0 CT1 TAPERPNT 27 (SUTURE) ×1 IMPLANT
SUTURE FIBERWR #5 38 CONV NDL (SUTURE) ×4 IMPLANT
TOWEL GREEN STERILE (TOWEL DISPOSABLE) ×3 IMPLANT
TRAY FOLEY W/BAG SLVR 14FR (SET/KITS/TRAYS/PACK) IMPLANT
WATER STERILE IRR 1000ML POUR (IV SOLUTION) ×3 IMPLANT
YANKAUER SUCT BULB TIP NO VENT (SUCTIONS) ×2 IMPLANT

## 2021-10-27 NOTE — Progress Notes (Signed)
Patient noted to be restless and anxious this morning. He pulled out his IV and reported seeing a dog in the room. BP 136/72, HR 104 and 96% on 2L oxygen. When asked when was his last drink, pt stated about a week ago. Pt has intermittent confusion and requires frequent reorientation. Notified on call TRH. See new orders on MAR.

## 2021-10-27 NOTE — Anesthesia Procedure Notes (Signed)
Procedure Name: Intubation Date/Time: 10/27/2021 2:14 PM Performed by: Barrington Ellison, CRNA Pre-anesthesia Checklist: Patient identified, Emergency Drugs available, Suction available and Patient being monitored Patient Re-evaluated:Patient Re-evaluated prior to induction Oxygen Delivery Method: Circle System Utilized Preoxygenation: Pre-oxygenation with 100% oxygen Induction Type: IV induction Ventilation: Mask ventilation without difficulty Laryngoscope Size: Mac and 4 Grade View: Grade I Tube type: Oral Tube size: 7.5 mm Number of attempts: 1 Airway Equipment and Method: Stylet and Oral airway Placement Confirmation: ETT inserted through vocal cords under direct vision, positive ETCO2 and breath sounds checked- equal and bilateral Secured at: 21 cm Tube secured with: Tape Dental Injury: Teeth and Oropharynx as per pre-operative assessment

## 2021-10-27 NOTE — Anesthesia Preprocedure Evaluation (Addendum)
Anesthesia Evaluation  Patient identified by MRN, date of birth, ID band Patient awake    Reviewed: Allergy & Precautions, H&P , NPO status , Patient's Chart, lab work & pertinent test results  Airway Mallampati: II  TM Distance: >3 FB Neck ROM: Full    Dental no notable dental hx. (+) Edentulous Upper, Poor Dentition, Partial Lower, Dental Advisory Given   Pulmonary neg pulmonary ROS, Current Smoker and Patient abstained from smoking.,    Pulmonary exam normal breath sounds clear to auscultation       Cardiovascular hypertension, + CAD and + Peripheral Vascular Disease  negative cardio ROS   Rhythm:Regular Rate:Normal     Neuro/Psych Dementia negative neurological ROS     GI/Hepatic negative GI ROS, Neg liver ROS,   Endo/Other  diabetes, Type 2, Oral Hypoglycemic Agents  Renal/GU Renal InsufficiencyRenal disease  negative genitourinary   Musculoskeletal   Abdominal   Peds  Hematology negative hematology ROS (+) Blood dyscrasia, anemia ,   Anesthesia Other Findings   Reproductive/Obstetrics negative OB ROS                            Anesthesia Physical Anesthesia Plan  ASA: 3  Anesthesia Plan: General   Post-op Pain Management: Regional block and Tylenol PO (pre-op)   Induction: Intravenous  PONV Risk Score and Plan: 2 and Ondansetron and Dexamethasone  Airway Management Planned: Oral ETT  Additional Equipment:   Intra-op Plan:   Post-operative Plan: Extubation in OR  Informed Consent: I have reviewed the patients History and Physical, chart, labs and discussed the procedure including the risks, benefits and alternatives for the proposed anesthesia with the patient or authorized representative who has indicated his/her understanding and acceptance.     Dental advisory given  Plan Discussed with: CRNA  Anesthesia Plan Comments:         Anesthesia Quick  Evaluation

## 2021-10-27 NOTE — Anesthesia Procedure Notes (Signed)
Anesthesia Regional Block: Interscalene brachial plexus block   Pre-Anesthetic Checklist: , timeout performed,  Correct Patient, Correct Site, Correct Laterality,  Correct Procedure, Correct Position, site marked,  Risks and benefits discussed,  Pre-op evaluation,  At surgeon's request and post-op pain management  Laterality: Right  Prep: Maximum Sterile Barrier Precautions used, chloraprep       Needles:  Injection technique: Single-shot  Needle Type: Echogenic Stimulator Needle     Needle Length: 5cm  Needle Gauge: 22     Additional Needles:   Procedures:,,,, ultrasound used (permanent image in chart),,    Narrative:  Start time: 10/27/2021 12:45 PM End time: 10/27/2021 12:55 PM Injection made incrementally with aspirations every 5 mL. Anesthesiologist: Roderic Palau, MD

## 2021-10-27 NOTE — Progress Notes (Signed)
Pt arrived to unit alert oriented to person, he is trying to get out of bed and pulling at wound vac, mitts applied. Family at bedside

## 2021-10-27 NOTE — Op Note (Signed)
Orthopaedic Surgery Operative Note (CSN: [id number])  LEONE PUTMAN  1951-08-07 Date of Surgery: 10/27/2021   Diagnoses:  Right proximal humerus fracture with humeral shaft fracture in the setting of improving rhabdomyolysis  Procedure: Right reverse total Shoulder Arthroplasty Right shoulder open external fixation of tuberosities Right shoulder open reduction total fixation of humeral shaft fracture   Operative Finding Successful completion of planned procedure.  Patient has an extraordinarily complex condition.  Overall his surgery went well considering his situation.  He is extremely high risk of infection, dislocation, loss of fixation and wound dehiscence.  We discussed this with his family however we did not feel that another week delaying care would benefit the patient as he has been afebrile for an extended period of time though there was concern for cellulitis versus what has been reported is more likely resolving skin changes from his injury and being found on this extremity for an unknown amount of time.  Due to the patient's skin changes there is no obvious sign intraoperatively of infection however his diffuse edematous skin was concerning and we took cultures at the time of the surgery.  There is no purulence within the joint.  Post-operative plan: The patient will be NWB in sling.  The patient will be  readmitted to the medicine service but disposition can happen at their discretion. We will continue a Prevena wound VAC for ideally a week.  DVT prophylaxis per primary team, no orthopedic contraindications.  Pain control with PRN pain medication preferring oral medicines.  Follow up plan will be scheduled in approximately 7 days for incision check and XR.  Physical therapy to start after 4 weeks  Implants: Tornier size 15 revive stem 130 mm, 15 standard proximal body, 0 high offset tray set to 6:00, 6 polyethylene.  42 standard glenosphere, 25 standard baseplate with a 40 center  screw and 2 peripheral locking screws  Post-Op Diagnosis: Same Surgeons:Primary: Hiram Gash, MD Assistants:Caroline McBane PA-C [facility] Anesthesia: General with Exparel Interscalene Antibiotics: Ancef 2g preop, Vancomycin 1058m locally, 1.2 g of tobramycin powder Tourniquet time: None Estimated Blood Loss: 1163Complications: None Specimens: 2 for culture Implants: Implant Name Type Inv. Item Serial No. Manufacturer Lot No. LRB No. Used Action  BASEPLATE GLENOSPHERE 284TXSTD - [id number] BASEPLATE GLENOSPHERE 282NOSTD [id number]  Right 1 Implanted  SCREW BONE 6.5X40 SM - [id number] SCREW BONE 6.5X40 SM  [employer]  Right 1 Implanted  SCREW 5.0X38 SMALL F/PERFORM - [id number] SCREW 5.0X38 SMALL F/PERFORM  [employer]  Right 1 Implanted  SCREW 5.5X22 - [id number] SCREW 5.5X22  [employer]  Right 1 Implanted  BASEPLATE GLENOID STD REV 42 - [id number] BASEPLATE GLENOID STD REV 42 [id number]  Right 1 Implanted  IMPLANT REVERSE SHOULDER 0X3.5 - [id number] IMPLANT REVERSE SHOULDER 0X3.5 [id number] [employer]  Right 1 Implanted  CAP LOCKING COCR - [id number] CAP LOCKING COCR [id number]  Right 1 Implanted  STEM PTC DISTAL 15X130 - [id number] STEM PTC DISTAL 15X130 [id number]  Right 1 Implanted  SCREW ASSEMBLY COCR TYPE 0 - [id number] SCREW ASSEMBLY COCR TYPE 0 [id number]  Right 1 Implanted  IMPLANT PROX BODY PTC 15X132 - [id number] IMPLANT PROX BODY PTC 15X132 [id number]  Right 1 Implanted  INSERT SHLD REV 42X6 ANGLE B - [id number] INSERT SHLD REV 42X6 ANGLE B [id number]  Right 1 Implanted  Indications for Surgery:   CAEDIN MOGAN is a 71 y.o. male with significant alcoholism, diabetes amongst other medical conditions who was found down about a week ago.  He was managed in outside facility and had  bacteremia.  He was kept on antibiotics and due to the complex nature of his injury there was request to transfer him to [facility].  On evaluation today there was concern for cellulitis but after talking to the referring physician as well as the patient they felt that this was more consistent with a resolving soft tissue injury after his compressive injury to that extremity.  Patient the patient's pain, afebrile state and the fact that antibiotics for extended period time had not significantly changed his upper extremity we felt that further delay in his care would not be in his best interest.  We understood and the family, patient and healthcare power of attorney all understood that he was extraordinarily high risk for complication.  Additionally his axillary nerve function was questionable preoperatively.  That said we felt that a hemiarthroplasty would predictably do poorly and attempted a reverse social arthroplasty would be appropriate.  Benefits and risks of operative and nonoperative management were discussed prior to surgery with patient/guardian(s) and informed consent form was completed.  Infection and need for further surgery were discussed as was prosthetic stability and cuff issues.  We additionally specifically discussed risks of axillary nerve injury, infection, periprosthetic fracture, continued pain and longevity of implants prior to beginning procedure.      Procedure:   The patient was identified in the preoperative holding area where the surgical site was marked. Block placed by anesthesia with exparel.  The patient was taken to the OR where a procedural timeout was called and the above noted anesthesia was induced.  The patient was positioned beachchair on allen table with spider arm positioner.  Preoperative antibiotics were dosed.  The patient's right shoulder was prepped and draped in the usual sterile fashion.  A second preoperative timeout was called.       Standard deltopectoral  approach was performed with a #10 blade. We dissected down to the subcutaneous tissues and the cephalic vein was taken laterally with the deltoid. Clavipectoral fascia was incised in line with the incision. Deep retractors were placed. The long of the biceps tendon was identified and there was significant tenosynovitis present.  Tenodesis was performed to the pectoralis tendon with #2 Ethibond. The remaining biceps was followed up into the rotator interval where it was released.    We used the bicipital groove as a landmark for the lesser and greater tuberosity fragments.  We were able to mobilize the lesser tuberosity fragment and placed stay sutures in the bone tendon junction to help with mobilization.  This point we were able to identify the  greater tuberosity fragment and 4 #5  FiberWire sutures were used to place into this for eventual repair of the tuberosities.  Once these were both mobilized we took care to identify the shaft fragment as well as the head fragment.  We carefully identified the head fragment were able to manually remove it.  At this point the axillary nerve was found and palpated and with a tug test noted to be intact though the spike of bone from the distal fracture was clearly tenting the nerve.  Protected throughout the remainder of the case with blunt retractors.    We then released the SGHL with bovie cautery prior to placing a curved mayo at the junction  of the anterior glenoid well above the axillary nerve and bluntly dissecting the subscapularis from the capsule.  We then carefully protected the axillary nerve as we gently released the inferior capsule to fully mobilize the subscapularis.  An anterior deltoid retractor was then placed as well as a small Hohmann retractor superiorly.  There were multiple comminuted fragments were completely devascularized and a large butterfly fragment that had the pectoralis tendon attached.  We are able to mobilize the attached fragment but  remove the devascularized fragments to avoid nidus for infection.  We manipulated the pectoralis fragment and provisionally held in place with 2 fiber tape cerclage sutures that were not fully tension but were placed taking care to stay directly on bone.  These would eventually be used for the open reduction trial fixation of the shaft.   The glenoid was relatively preserved as we would expect in this fracture patient.  The remaining labrum was removed circumferentially taking great care not to disrupt the posterior capsule.    The glenoid drill guide was placed and used to drill a guide pin in the center, inferior position. The glenoid face was then reamed concentrically over the guide wire. The center hole was drilled over the guidepin in a near anatomic angle of version. Next the glenoid vault was drilled back to a depth of 40 mm.  We tapped and then placed a 65m size baseplate with 0 lateralization was selected with a 6.5 mm x 465mlength central screw.  The base plate was screwed into the glenoid vault obtaining secure fixation. We next placed superior and inferior locking screws for additional fixation.  Next a 42 mm glenosphere was selected and impacted onto the baseplate. The center screw was tightened.   We then repositioned the arm to give access to the humeral shaft fragment.  There was no bone for vertical stabilization and we passed our vertical stabilization suture around our fiber tape cerclage sutures..  We broached with the Revive stem implants  starting with a size 9 reamer and reaming up to 15 which obtained an appropriate fit and at least 5 cm of fixation distal to our distal aspect of the fracture.  The proximal body was sized separately and attached to trial and achieve a stable articulation.    We trialed with multiple size tray and polyethylene options and selected a 0 high which provided good stability and range of motion without excess soft tissue tension. The offset was dialed in  to match the normal anatomy. The shoulder was trialed.  There was good ROM in all planes and the shoulder was stable with no inferior translation.  At this point we were able to mobilize the butterfly fragment but the pectoralis was attached to.  We held it perfectly reduced and used the tension device to set 60 inch pounds of tension holding the butterfly fragment in place.  The fiber tape cerclage sutures were both tightened and alternating half hitches were used to secure them from loosening.  This constituted the open reduction trolled fixation of the shaft.   We then mobilized her tuberosities again and placed the anterior deep limbs of the 4 #5 fiber wires around the stem.  1 of these was tied down fixing the greater tuberosity in place after bone graft harvest from the humeral head component was placed underneath.  A +0 high offset tray was selected and impacted onto the stem.   A 42+6 polyethylene liner was impacted onto the stem.  The joint was  reduced and thoroughly irrigated with pulsatile lavage. The remaining sutures were then placed through the subscapularis and the bone tendon junction and the tuberosities were reduced after bone graft placed beneath as autograft at the subscap.  We horizontally secured the tuberosities before placing vertical fixation with the suture that was placed into the shaft.  Tuberosities moved as a unit were happy with her overall reduction.  This was checked on fluoroscopy confirming our position.  We irrigated copiously at this point.  Hemostasis was obtained. The deltopectoral interval was reapproximated with #1 Ethibond. The subcutaneous tissues were closed with 3-0 Vicryl and the skin was closed with running monocryl.     The wounds were cleaned and dried and an Prevena wound VAC was placed. The drapes taken down. The arm was placed into sling with abduction pillow. Patient was awakened, extubated, and transferred to the recovery room in stable condition. There  were no intraoperative complications. The sponge, needle, and attention counts were correct at the end of the case.     Noemi Chapel, PA-C, present and scrubbed throughout the case, critical for completion in a timely fashion, and for retraction, instrumentation, closure.

## 2021-10-27 NOTE — Consult Note (Signed)
ORTHOPAEDIC CONSULTATION  REQUESTING PHYSICIAN: Nolberto Hanlon, MD  Chief Complaint: right proximal humerus fracture  HPI: Edward Singleton is a 71 y.o. male with  alcohol abuse, diabetes, HTN, HLD. He had a fall this weekend. Patient admitted to the [facility] for alcoholic ketosis, hypothermia, shock, and rhabdomyolysis. Blood cultures positive for Streptococcus Galloyticus. He was found to have displaced right proximal humerus fracture. He was transferred to [facility] for operative management.   Patient was evaluated this morning.  No major new changes.  He is anxious to have surgery.  He reports that his power of attorney is his sister.    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   Diabetes mellitus without complication ([place])    Gout    Gout    Hypercholesteremia    Hypertension    Kidney stones    Left flank pain, chronic    Pelvic fracture ([place]) 11/17/2014   Past Surgical History:  Procedure Laterality Date   ESOPHAGOGASTRODUODENOSCOPY Left 03/18/2019   Procedure: ESOPHAGOGASTRODUODENOSCOPY (EGD);  Surgeon: Jonathon Bellows, MD;  Location: [facility] ENDOSCOPY;  Service: Gastroenterology;  Laterality: Left;   left renal stent placement  2013   LITHOTRIPSY     ORIF HUMERUS FRACTURE Left 11/18/2014   Procedure: OPEN REDUCTION INTERNAL FIXATION (ORIF) DISTAL HUMERUS FRACTURE;  Surgeon: Rozanna Box, MD;  Location: [place];  Service: Orthopedics;  Laterality: Left;   SPLENECTOMY     Social History   Socioeconomic History   Marital status: Single    Spouse name: Not on file   Number of children: Not on file   Years of education: Not on file   Highest education level: Not on file  Occupational History   Not on file  Tobacco Use   Smoking status: Every Day    Packs/day: 1.00    Years: 50.00    Pack years: 50.00    Types: Cigarettes   Smokeless tobacco:  Former    Types: Nurse, children's Use: Never used  Substance and Sexual Activity   Alcohol use: Not Currently    Alcohol/week: 42.0 standard drinks    Types: 42 Cans of beer per week    Comment: quit june 4th   Drug use: No   Sexual activity: Not on file  Other Topics Concern   Not on file  Social History Narrative   Not on file   Social Determinants of Health   Financial Resource Strain: Not on file  Food Insecurity: Not on file  Transportation Needs: Not on file  Physical Activity: Not on file  Stress: Not on file  Social Connections: Not on file   Family History  Problem Relation Age of Onset   Heart disease Mother    Heart disease Father    Diabetes Father    Nephrolithiasis Paternal Grandfather    Kidney disease Neg Hx    Prostate cancer Neg Hx    No Known Allergies Prior to Admission medications   Medication Sig Start Date End Date Taking? Authorizing Provider  allopurinol (ZYLOPRIM) 300 MG tablet Take 150 mg by mouth daily.    [provider]  amLODipine (NORVASC) 5 MG tablet Take 5 mg by mouth daily at 12 noon. 06/04/19   [provider]  aspirin 81 MG chewable tablet Chew 81 mg  by mouth daily.    [provider]  escitalopram (LEXAPRO) 10 MG tablet Take 10 mg by mouth daily.    [provider]  folic acid (FOLVITE) 1 MG tablet Take 1 mg by mouth daily.    [provider]  metFORMIN (GLUCOPHAGE) 500 MG tablet Take 500 mg by mouth 2 (two) times daily. 06/04/19   [provider]  metoprolol succinate (TOPROL-XL) 50 MG 24 hr tablet Take 0.5 tablets by mouth daily. 06/04/19   [provider]  rosuvastatin (CRESTOR) 20 MG tablet Take 20 mg by mouth daily.    [provider]  thiamine (VITAMIN B-1) 100 MG tablet Take 100 mg by mouth daily.    [provider]  traZODone (DESYREL) 50 MG tablet Take 50 mg by mouth daily.    [provider]   DG CHEST PORT 1 VIEW  Result  Date: 10/26/2021 CLINICAL DATA:  Preoperative assessment, right humeral fracture EXAM: PORTABLE CHEST 1 VIEW COMPARISON:  10/20/2021 FINDINGS: Single frontal view of the chest demonstrates a stable cardiac silhouette. No acute airspace disease, effusion, or pneumothorax. Stable emphysema. No acute bony abnormality. Stable bilateral healed rib fractures. IMPRESSION: 1. Stable emphysema.  No acute intrathoracic process. Electronically Signed   By: Randa Ngo M.D.   On: 10/26/2021 23:56   Family History Reviewed and non-contributory, no pertinent history of problems with bleeding or anesthesia      Review of Systems 14 system ROS conducted and negative except for that noted in HPI   OBJECTIVE  Vitals:Patient Vitals for the past 8 hrs:  BP Temp Temp src Pulse Resp SpO2  10/27/21 0627 136/72 98.3 F (36.8 C) Oral (!) 104 18 96 %  10/27/21 0414 134/66 -- -- (!) 101 17 94 %   General: Alert, no acute distress Cardiovascular: Warm extremities noted Respiratory: No cyanosis, no use of accessory musculature GI: No organomegaly, abdomen is soft and non-tender Skin: No lesions in the area of chief complaint other than those listed below in MSK exam.  Neurologic: Sensation intact distally save for the below mentioned MSK exam Psychiatric: Patient is competent for consent with normal mood and affect Lymphatic: No swelling obvious and reported other than the area involved in the exam below  Extremities  RUE: Patient has no obvious deltoid firing though it is quite difficult to test secondary to his pain.  He has some sensory function in his axillary nerve distribution but it is unclear on how abnormal it might be.  Unfortunately further testing limited by patient cooperation pain.  His hand is well-perfused but extraordinarily swollen.  He does have some blanching erythema in his mid humerus area which has reportedly improved. LUE: actively moves left upper extremity without pain. NVI Bilateral  lower extremity: actively moves bilateral lower extremity without pain. Non tender to palpation throughout. Neurovascularly intact.     Test Results Imaging CT of right shoulder demonstrates "Severely comminuted and displaced fracture of the surgical neck of right proximal humerus. 4 cm of lateral displacement, 2 cm of superior displacement. Numerous fracture fragments are located along the anterior humeral head. Severely comminuted fracture of the greater tuberosity and lesser tuberosity. Fracture cleft extends to the superior articular surface."  Labs cbc Recent Labs    10/26/21 0601 10/27/21 0223  WBC 12.9* 15.5*  HGB 10.4* 10.3*  HCT 31.1* 30.5*  PLT 552* 596*     Labs inflam No results for input(s): CRP in the last 72 hours.  Invalid input(s): ESR  Labs coag No results for input(s): INR, PTT in the last 72 hours.  Invalid input(s): PT  Recent Labs    10/26/21 0601 10/27/21 0223  NA 131* 132*  K 3.3* 3.9  CL 95* 99  CO2 24 21*  GLUCOSE 106* 100*  BUN 18 19  CREATININE 1.69* 1.95*  CALCIUM 8.5* 8.5*      ASSESSMENT AND PLAN: 71 y.o. male with the following: Displaced right proximal humerus fracture  Orthopedics recommends admission to a medical service and we will provide consultation and follow along.   This patient has an extraordinarily high risk of complication.  His x-ray nerve function is questionable.  He has an extraordinarily comminuted fracture and will require longstem revision implants.  Additionally his overall health condition makes him at high risk for infection as this is overall trauma to this extremity.  We discussed his redness in his upper arm with Dr. Mack Guise who reports that it has been improving.  We feel that based on his overall picture it is unlikely to be a cellulitis and more likely to be related to his state found down on this extremity.  Additionally he has been on antibiotics for some time and will continue till the 25th.  We  think that additional time waiting to have surgery would not be in the patient's best interest as it is risk of infection may not decrease however his overall morbidity will increase significantly waiting for additional days.  Dislocation and infection are his most likely risks as his fracture fixation failure.  We will attempt to do a reverse total shoulder arthroplasty however if he has instability events we may have to resect and go towards a hemiarthroplasty.  We discussed all this with his sister his power of attorney.  The risks benefits and alternatives were discussed with the patient including but not limited to the risks of nonoperative treatment, versus surgical intervention including infection, bleeding, nerve injury, periprosthetic fracture, the need for revision surgery, blood clots, cardiopulmonary complications, morbidity, mortality, among others, and they were willing to proceed.    We additionally specifically discussed risks of axillary nerve injury, infection, periprosthetic fracture, continued pain and longevity of implants prior to beginning procedure.     - Weight Bearing Status/Activity: NWB RUE - Additional recommended labs/tests: None - VTE Prophylaxis: per primary - Pain control: PRN pain medications -Procedures: Right reverse total shoulder arthroplasty for fracture  - NPO at midnight

## 2021-10-27 NOTE — Transfer of Care (Signed)
Immediate Anesthesia Transfer of Care Note  Patient: Edward Singleton  Procedure(s) Performed: REVERSE SHOULDER ARTHROPLASTY (Right: Shoulder)  Patient Location: PACU  Anesthesia Type:General and Regional  Level of Consciousness: awake  Airway & Oxygen Therapy: Patient Spontanous Breathing and Patient connected to nasal cannula oxygen  Post-op Assessment: Report given to RN  Post vital signs: Reviewed and stable  Last Vitals:  Vitals Value Taken Time  BP 106/56 10/27/21 1634  Temp    Pulse 94 10/27/21 1634  Resp 18 10/27/21 1635  SpO2 91 % 10/27/21 1634  Vitals shown include unvalidated device data.  Last Pain:  Vitals:   10/27/21 1237  TempSrc:   PainSc: 5       Patients Stated Pain Goal: 5 (26/94/85 4627)  Complications: No notable events documented.

## 2021-10-27 NOTE — Anesthesia Postprocedure Evaluation (Signed)
Anesthesia Post Note  Patient: Edward Singleton  Procedure(s) Performed: REVERSE SHOULDER ARTHROPLASTY (Right: Shoulder)     Patient location during evaluation: PACU Anesthesia Type: General and Regional Level of consciousness: awake and alert Pain management: pain level controlled Vital Signs Assessment: post-procedure vital signs reviewed and stable Respiratory status: spontaneous breathing, nonlabored ventilation, respiratory function stable and patient connected to nasal cannula oxygen Cardiovascular status: blood pressure returned to baseline and stable Postop Assessment: no apparent nausea or vomiting Anesthetic complications: no   No notable events documented.  Last Vitals:  Vitals:   10/27/21 1635 10/27/21 1650  BP: (!) 106/56 117/79  Pulse: 94 93  Resp: 18 15  Temp: (!) 36.2 C   SpO2: 91% 98%    Last Pain:  Vitals:   10/27/21 1237  TempSrc:   PainSc: 5                  Charnee Turnipseed,W. EDMOND

## 2021-10-27 NOTE — Progress Notes (Signed)
PROGRESS NOTE    Edward Singleton  WRU:[id number] DOB: [birth year] DOA: 10/26/2021 PCP: Windy Fast, MD    Brief Narrative:  Edward Singleton is a pleasant 71 y.o. male with medical history significant for alcohol abuse, chronic renal insufficiency, anemia, hypertension, and type 2 diabetes mellitus who has been transferred from [facility] for operative management of proximal right humerus fracture.  He had presented to [facility] ED on 10/18/2021 after he had not been seen for several days and was found down at home after police forced entry, had significant bruising on his right arm, was confused, hypotensive, and hypothermic.  Per report of his sister, [name], who is healthcare power of attorney, he had been binge drinking since the summer.   [facility] ED and Hospital Course: Upon arrival to the ED, patient was found to be hypotensive with systolic pressures in the 70s, hypothermic to 32.4 C.  He was found to be in rhabdomyolysis with AKI superimposed on CKD, had initial lactic acid greater than 8, elevated procalcitonin, proximal right humerus fracture, and prolonged QT interval.   He was fluid resuscitated, given warmed IV fluids initially, admitted to the ICU on 10/18/2021, was requiring Levophed, but was stabilized and off of Levophed by 10/20/2021.   He was seen by nephrology during the hospitalization, serum CK trended down, and his renal function returned to baseline.   He had a streptococcal bacteremia, was evaluated by ID, noted that this could possibly be related to a colon malignancy or contaminant, was treated with Rocephin, has since been transitioned to cefazolin, and it was recommended that he continue antibiotics through 11/03/2021 which could be changed to amoxicillin when he is discharged.   Orthopedic surgeon at [facility] discussed the case with Dr. Griffin Basil who agreed with transfer to [facility] with tentative plan for reverse arthroplasty on 10/27/2021.   1/18 plan to go to the OR  today.   Consultants:  Orthopedics  Procedures:   Antimicrobials:  Cefazolin   Subjective: Complaining of right arm pain.  Objective: Vitals:   10/26/21 2252 10/27/21 0050 10/27/21 0414 10/27/21 0627  BP: 128/71 130/75 134/66 136/72  Pulse: (!) 114 (!) 104 (!) 101 (!) 104  Resp: 18 18 17 18   Temp: 99.3 F (37.4 C) 98.5 F (36.9 C)  98.3 F (36.8 C)  TempSrc:  Oral  Oral  SpO2: 95% 95% 94% 96%    Intake/Output Summary (Last 24 hours) at 10/27/2021 1039 Last data filed at 10/27/2021 1000 Gross per 24 hour  Intake --  Output 0 ml  Net 0 ml   There were no vitals filed for this visit.  Examination:  General exam: Appears calm and comfortable  Respiratory system: Clear to auscultation. Respiratory effort normal. Cardiovascular system: S1 & S2 heard, RRR. No JVD, murmurs, rubs, gallops or clicks.  Gastrointestinal system: Abdomen is nondistended, soft and nontender. Normal bowel sounds heard. Central nervous system: Alert and alert, grossly intact  extremities: No edema Psychiatry: Mood & affect appropriate in current setting.     Data Reviewed: I have personally reviewed following labs and imaging studies  CBC: Recent Labs  Lab 10/23/21 0516 10/24/21 0456 10/25/21 0422 10/26/21 0601 10/27/21 0223  WBC 19.5* 19.1* 16.6* 12.9* 15.5*  HGB 10.0* 9.9* 10.3* 10.4* 10.3*  HCT 29.5* 28.8* 30.6* 31.1* 30.5*  MCV 97.0 96.3 98.1 98.4 101.0*  PLT 290 360 427* 552* 914*   Basic Metabolic Panel: Recent Labs  Lab 10/22/21 0444 10/23/21 0516 10/24/21 0456 10/25/21 0422 10/26/21 0601  10/27/21 0223  NA 134* 132* 133* 130* 131* 132*  K 4.1 3.7 3.7 3.6 3.3* 3.9  CL 100 96* 99 96* 95* 99  CO2 26 25 23 24 24  21*  GLUCOSE 129* 115* 120* 117* 106* 100*  BUN 23 19 17 17 18 19   CREATININE 1.91* 1.65* 1.67* 1.66* 1.69* 1.95*  CALCIUM 8.4* 8.3* 8.6* 8.7* 8.5* 8.5*  MG 1.6* 2.0  --   --   --  1.7  PHOS  --   --   --   --   --  2.9   GFR: Estimated Creatinine  Clearance: 33.5 mL/min (A) (by C-G formula based on SCr of 1.95 mg/dL (H)). Liver Function Tests: No results for input(s): AST, ALT, ALKPHOS, BILITOT, PROT, ALBUMIN in the last 168 hours. No results for input(s): LIPASE, AMYLASE in the last 168 hours. No results for input(s): AMMONIA in the last 168 hours. Coagulation Profile: No results for input(s): INR, PROTIME in the last 168 hours. Cardiac Enzymes: Recent Labs  Lab 10/21/21 0534 10/22/21 0444  CKTOTAL 861* 581*   BNP (last 3 results) No results for input(s): PROBNP in the last 8760 hours. HbA1C: No results for input(s): HGBA1C in the last 72 hours. CBG: Recent Labs  Lab 10/26/21 1136 10/26/21 1646 10/27/21 0009 10/27/21 0412 10/27/21 0740  GLUCAP 111* 124* 105* 94 113*   Lipid Profile: No results for input(s): CHOL, HDL, LDLCALC, TRIG, CHOLHDL, LDLDIRECT in the last 72 hours. Thyroid Function Tests: No results for input(s): TSH, T4TOTAL, FREET4, T3FREE, THYROIDAB in the last 72 hours. Anemia Panel: No results for input(s): VITAMINB12, FOLATE, FERRITIN, TIBC, IRON, RETICCTPCT in the last 72 hours. Sepsis Labs: No results for input(s): PROCALCITON, LATICACIDVEN in the last 168 hours.  Recent Results (from the past 240 hour(s))  Blood culture (routine x 2)     Status: Abnormal   Collection Time: 10/18/21  6:24 PM   Specimen: BLOOD  Result Value Ref Range Status   Specimen Description   Final    BLOOD FOOT Performed at [facility], [address] [place], [place] [address]    Special Requests   Final    BOTTLES DRAWN AEROBIC AND ANAEROBIC BCAV Performed at [facility], [address] [place], [place] [address]    Culture  Setup Time   Final    Organism ID to follow IN BOTH AEROBIC AND ANAEROBIC BOTTLES GRAM POSITIVE COCCI CRITICAL RESULT CALLED TO, READ BACK BY AND VERIFIED WITH: NATHAN BEULE 10/19/21 0602 MW    Culture (A)  Final    STREPTOCOCCUS GALLOLYTICUS STAPHYLOCOCCUS  EPIDERMIDIS THE SIGNIFICANCE OF ISOLATING THIS ORGANISM FROM A SINGLE SET OF BLOOD CULTURES WHEN MULTIPLE SETS ARE DRAWN IS UNCERTAIN. PLEASE NOTIFY THE MICROBIOLOGY DEPARTMENT WITHIN ONE WEEK IF SPECIATION AND SENSITIVITIES ARE REQUIRED. CRITICAL RESULT CALLED TO, READ BACK BY AND VERIFIED WITH: [place]. [phone] FCP Performed at [facility], [address], [place], [place] [address]    Report Status 10/24/2021 FINAL  Final   Organism ID, Bacteria STREPTOCOCCUS GALLOLYTICUS  Final      Susceptibility   Streptococcus gallolyticus - MIC*    PENICILLIN 0.12 SENSITIVE Sensitive     CEFTRIAXONE <=0.12 SENSITIVE Sensitive     ERYTHROMYCIN <=0.12 SENSITIVE Sensitive     LEVOFLOXACIN 2 SENSITIVE Sensitive     VANCOMYCIN 0.5 SENSITIVE Sensitive     * STREPTOCOCCUS GALLOLYTICUS  Blood Culture ID Panel (Reflexed)     Status: Abnormal   Collection  Time: 10/18/21  6:24 PM  Result Value Ref Range Status   Enterococcus faecalis NOT DETECTED NOT DETECTED Final   Enterococcus Faecium NOT DETECTED NOT DETECTED Final   Listeria monocytogenes NOT DETECTED NOT DETECTED Final   Staphylococcus species NOT DETECTED NOT DETECTED Final   Staphylococcus aureus (BCID) NOT DETECTED NOT DETECTED Final   Staphylococcus epidermidis NOT DETECTED NOT DETECTED Final   Staphylococcus lugdunensis NOT DETECTED NOT DETECTED Final   Streptococcus species DETECTED (A) NOT DETECTED Final    Comment: Not Enterococcus species, Streptococcus agalactiae, Streptococcus pyogenes, or Streptococcus pneumoniae. CRITICAL RESULT CALLED TO, READ BACK BY AND VERIFIED WITH: NATHAN BEULE 10/19/21 0602 MW    Streptococcus agalactiae NOT DETECTED NOT DETECTED Final   Streptococcus pneumoniae NOT DETECTED NOT DETECTED Final   Streptococcus pyogenes NOT DETECTED NOT DETECTED Final   A.calcoaceticus-baumannii NOT DETECTED NOT DETECTED Final   Bacteroides fragilis NOT DETECTED NOT DETECTED Final   Enterobacterales NOT  DETECTED NOT DETECTED Final   Enterobacter cloacae complex NOT DETECTED NOT DETECTED Final   Escherichia coli NOT DETECTED NOT DETECTED Final   Klebsiella aerogenes NOT DETECTED NOT DETECTED Final   Klebsiella oxytoca NOT DETECTED NOT DETECTED Final   Klebsiella pneumoniae NOT DETECTED NOT DETECTED Final   Proteus species NOT DETECTED NOT DETECTED Final   Salmonella species NOT DETECTED NOT DETECTED Final   Serratia marcescens NOT DETECTED NOT DETECTED Final   Haemophilus influenzae NOT DETECTED NOT DETECTED Final   Neisseria meningitidis NOT DETECTED NOT DETECTED Final   Pseudomonas aeruginosa NOT DETECTED NOT DETECTED Final   Stenotrophomonas maltophilia NOT DETECTED NOT DETECTED Final   Candida albicans NOT DETECTED NOT DETECTED Final   Candida auris NOT DETECTED NOT DETECTED Final   Candida glabrata NOT DETECTED NOT DETECTED Final   Candida krusei NOT DETECTED NOT DETECTED Final   Candida parapsilosis NOT DETECTED NOT DETECTED Final   Candida tropicalis NOT DETECTED NOT DETECTED Final   Cryptococcus neoformans/gattii NOT DETECTED NOT DETECTED Final    Comment: Performed at [facility], [address] [place], [place] [address]  Resp Panel by RT-PCR (Flu A&B, Covid) Nasopharyngeal Swab     Status: None   Collection Time: 10/18/21  8:16 PM   Specimen: Nasopharyngeal Swab; Nasopharyngeal(NP) swabs in vial transport medium  Result Value Ref Range Status   SARS Coronavirus 2 by RT PCR NEGATIVE NEGATIVE Final    Comment: (NOTE) SARS-CoV-2 target nucleic acids are NOT DETECTED.  The SARS-CoV-2 RNA is generally detectable in upper respiratory specimens during the acute phase of infection. The lowest concentration of SARS-CoV-2 viral copies this assay can detect is 138 copies/mL. A negative result does not preclude SARS-Cov-2 infection and should not be used as the sole basis for treatment or other patient management decisions. A negative result may occur with  improper  specimen collection/handling, submission of specimen other than nasopharyngeal swab, presence of viral mutation(s) within the areas targeted by this assay, and inadequate number of viral copies(<138 copies/mL). A negative result must be combined with clinical observations, patient history, and epidemiological information. The expected result is Negative.  Fact Sheet for Patients:  EntrepreneurPulse.com.au  Fact Sheet for Healthcare Providers:  IncredibleEmployment.be  This test is no t yet approved or cleared by the [place] FDA and  has been authorized for detection and/or diagnosis of SARS-CoV-2 by FDA under an Emergency Use Authorization (EUA). This EUA will remain  in effect (meaning this test can be used) for the duration of the COVID-19 declaration under Section  564(b)(1) of the Act, 21 U.S.C.section 360bbb-3(b)(1), unless the authorization is terminated  or revoked sooner.       Influenza A by PCR NEGATIVE NEGATIVE Final   Influenza B by PCR NEGATIVE NEGATIVE Final    Comment: (NOTE) The Xpert Xpress SARS-CoV-2/FLU/RSV plus assay is intended as an aid in the diagnosis of influenza from Nasopharyngeal swab specimens and should not be used as a sole basis for treatment. Nasal washings and aspirates are unacceptable for Xpert Xpress SARS-CoV-2/FLU/RSV testing.  Fact Sheet for Patients: EntrepreneurPulse.com.au  Fact Sheet for Healthcare Providers: IncredibleEmployment.be  This test is not yet approved or cleared by the [place] FDA and has been authorized for detection and/or diagnosis of SARS-CoV-2 by FDA under an Emergency Use Authorization (EUA). This EUA will remain in effect (meaning this test can be used) for the duration of the COVID-19 declaration under Section 564(b)(1) of the Act, 21 U.S.C. section 360bbb-3(b)(1), unless the authorization is terminated or revoked.  Performed at  [facility], [address] [place], [place] [address]   Culture, blood (Routine X 2) w Reflex to ID Panel     Status: None   Collection Time: 10/18/21 11:28 PM   Specimen: BLOOD  Result Value Ref Range Status   Specimen Description BLOOD LEFT HAND  Final   Special Requests   Final    BOTTLES DRAWN AEROBIC ONLY Blood Culture adequate volume   Culture   Final    NO GROWTH 5 DAYS Performed at [facility], [address] [place], [place] [address]    Report Status 10/23/2021 FINAL  Final  MRSA Next Gen by PCR, Nasal     Status: None   Collection Time: 10/19/21  5:04 AM   Specimen: Nasal Mucosa; Nasal Swab  Result Value Ref Range Status   MRSA by PCR Next Gen NOT DETECTED NOT DETECTED Final    Comment: (NOTE) The GeneXpert MRSA Assay (FDA approved for NASAL specimens only), is one component of a comprehensive MRSA colonization surveillance program. It is not intended to diagnose MRSA infection nor to guide or monitor treatment for MRSA infections. Test performance is not FDA approved in patients less than 53 years old. Performed at [facility], [address] [place], [place] [address]   CULTURE, BLOOD (ROUTINE X 2) w Reflex to ID Panel     Status: None   Collection Time: 10/20/21  6:08 PM   Specimen: BLOOD  Result Value Ref Range Status   Specimen Description BLOOD LFOA  Final   Special Requests BOTTLES DRAWN AEROBIC AND ANAEROBIC BCAV  Final   Culture   Final    NO GROWTH 5 DAYS Performed at [facility], [address] [place], [place] [address]    Report Status 10/25/2021 FINAL  Final  CULTURE, BLOOD (ROUTINE X 2) w Reflex to ID Panel     Status: None   Collection Time: 10/20/21  6:23 PM   Specimen: BLOOD  Result Value Ref Range Status   Specimen Description BLOOD [facility]  Final   Special Requests BOTTLES DRAWN AEROBIC ONLY BCLV  Final   Culture   Final    NO GROWTH 5 DAYS Performed at [facility], [address], [place], [place] [address]    Report Status 10/25/2021 FINAL  Final  MRSA Next Gen by PCR, Nasal     Status: None   Collection Time: 10/27/21  6:39 AM   Specimen: Nasal Mucosa; Nasal Swab  Result Value Ref  Range Status   MRSA by PCR Next Gen NOT DETECTED NOT DETECTED Final    Comment: (NOTE) The GeneXpert MRSA Assay (FDA approved for NASAL specimens only), is one component of a comprehensive MRSA colonization surveillance program. It is not intended to diagnose MRSA infection nor to guide or monitor treatment for MRSA infections. Test performance is not FDA approved in patients less than 61 years old. Performed at [facility], [place] [address] [place], [place] [address]          Radiology Studies: DG CHEST PORT 1 VIEW  Result Date: 10/26/2021 CLINICAL DATA:  Preoperative assessment, right humeral fracture EXAM: PORTABLE CHEST 1 VIEW COMPARISON:  10/20/2021 FINDINGS: Single frontal view of the chest demonstrates a stable cardiac silhouette. No acute airspace disease, effusion, or pneumothorax. Stable emphysema. No acute bony abnormality. Stable bilateral healed rib fractures. IMPRESSION: 1. Stable emphysema.  No acute intrathoracic process. Electronically Signed   By: Randa Ngo M.D.   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 Proximal right humerus fracture  - Presents in transfer from [facility] for orthopedic surgery consult, likely reverse arthoplasty  1/18 plan for the OR today   2. Strep bacteremia   - Seen by ID at [facility], source unclear, subsequent cultures negative, Per ID-This bacteria can be associated with Colon malignancy and has the potential to cause endocarditis, but low bioburden and with staph epidermidis in the same culture contamination can be questioned  repeated  imaging of rt shoulder and arm and there was no abscess or collection- 1/18 patient was on ceftriaxone switched to cefazolin.  Will need antibiotics until 11/03/2021 continue IV while in the hospital  Switch to p.o. amoxicillin on discharge      3. CAD; PAD  Asymptomatic currently without anginal complaints Continue statin Off aspirin since admission consider resuming after surgery     4. CKD IIIb  Close to baseline Avoid nephrotoxic meds   5. Urinary retention  - Foley placed at [facility] and present on arrival here  - Continue Foley care for now and voiding trial after surgery     6. Hypokalemia; hyponatremia  Potassium now stable Sodium 131 today continue to monitor   7. Anemia  - He was transfused on 1/12  - Hgb stable in 10-range last few days with no apparent bleeding     8. Alcohol abuse   - Unlikely to experience withdrawal at this point, continue b-vitamins and encourage alcohol avoidance going forward      DVT prophylaxis: SCD Code Status: Full Family Communication: None at bedside Disposition Plan: TBD  Status is: Inpatient  Remains inpatient appropriate because: IV treatment.  Plan to go for surgery.            LOS: 1 day   Time spent: 35 minutes with more than 50% on Major, MD [facility] Pager [phone]  If 7PM-7AM, please contact night-coverage 10/27/2021, 10:39 AM

## 2021-10-27 NOTE — Interval H&P Note (Signed)
All questions answered

## 2021-10-27 NOTE — H&P (View-Only) (Signed)
° ° ° °ORTHOPAEDIC CONSULTATION ° °REQUESTING PHYSICIAN: Amery, Sahar, MD ° °Chief Complaint: right proximal humerus fracture ° °HPI: °Edward Singleton is a 70 y.o. male with  alcohol abuse, diabetes, HTN, HLD. He had a fall this weekend. Patient admitted to the [facility] for alcoholic ketosis, hypothermia, shock, and rhabdomyolysis. Blood cultures positive for Streptococcus Galloyticus. He was found to have displaced right proximal humerus fracture. He was transferred to [facility] for operative management.  ° °Patient was evaluated this morning.  No major new changes.  He is anxious to have surgery.  He reports that his power of attorney is his sister. 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  ° Diabetes mellitus without complication (HCC)   ° Gout   ° Gout   ° Hypercholesteremia   ° Hypertension   ° Kidney stones   ° Left flank pain, chronic   ° Pelvic fracture (HCC) 11/17/2014  ° °Past Surgical History:  °Procedure Laterality Date  ° ESOPHAGOGASTRODUODENOSCOPY Left 03/18/2019  ° Procedure: ESOPHAGOGASTRODUODENOSCOPY (EGD);  Surgeon: Anna, Kiran, MD;  Location: [facility] ENDOSCOPY;  Service: Gastroenterology;  Laterality: Left;  ° left renal stent placement  2013  ° LITHOTRIPSY    ° ORIF HUMERUS FRACTURE Left 11/18/2014  ° Procedure: OPEN REDUCTION INTERNAL FIXATION (ORIF) DISTAL HUMERUS FRACTURE;  Surgeon: Michael H Handy, MD;  Location: [facility] OR;  Service: Orthopedics;  Laterality: Left;  ° SPLENECTOMY    ° °Social History  ° °Socioeconomic History  ° Marital status: Single  °  Spouse name: Not on file  ° Number of children: Not on file  ° Years of education: Not on file  ° Highest education level: Not on file  °Occupational History  ° Not on file  °Tobacco Use  ° Smoking status: Every Day  °  Packs/day: 1.00  °  Years: 50.00  °  Pack years: 50.00  °  Types: Cigarettes  ° Smokeless tobacco:  Former  °  Types: Chew  °Vaping Use  ° Vaping Use: Never used  °Substance and Sexual Activity  ° Alcohol use: Not Currently  °  Alcohol/week: 42.0 standard drinks  °  Types: 42 Cans of beer per week  °  Comment: quit june 4th  ° Drug use: No  ° Sexual activity: Not on file  °Other Topics Concern  ° Not on file  °Social History Narrative  ° Not on file  ° °Social Determinants of Health  ° °Financial Resource Strain: Not on file  °Food Insecurity: Not on file  °Transportation Needs: Not on file  °Physical Activity: Not on file  °Stress: Not on file  °Social Connections: Not on file  ° °Family History  °Problem Relation Age of Onset  ° Heart disease Mother   ° Heart disease Father   ° Diabetes Father   ° Nephrolithiasis Paternal Grandfather   ° Kidney disease Neg Hx   ° Prostate cancer Neg Hx   ° °No Known Allergies °Prior to Admission medications   °Medication Sig Start Date End Date Taking? Authorizing Provider  °allopurinol (ZYLOPRIM) 300 MG tablet Take 150 mg by mouth daily.    [provider]  °amLODipine (NORVASC) 5 MG tablet Take 5 mg by mouth daily at 12 noon. 06/04/19   [provider]  °aspirin 81 MG chewable tablet Chew 81 mg   by mouth daily.    [provider]  °escitalopram (LEXAPRO) 10 MG tablet Take 10 mg by mouth daily.    [provider]  °folic acid (FOLVITE) 1 MG tablet Take 1 mg by mouth daily.    [provider]  °metFORMIN (GLUCOPHAGE) 500 MG tablet Take 500 mg by mouth 2 (two) times daily. 06/04/19   [provider]  °metoprolol succinate (TOPROL-XL) 50 MG 24 hr tablet Take 0.5 tablets by mouth daily. 06/04/19   [provider]  °rosuvastatin (CRESTOR) 20 MG tablet Take 20 mg by mouth daily.    [provider]  °thiamine (VITAMIN B-1) 100 MG tablet Take 100 mg by mouth daily.    [provider]  °traZODone (DESYREL) 50 MG tablet Take 50 mg by mouth daily.    [provider]  ° °DG CHEST PORT 1 VIEW ° °Result  Date: 10/26/2021 °CLINICAL DATA:  Preoperative assessment, right humeral fracture EXAM: PORTABLE CHEST 1 VIEW COMPARISON:  10/20/2021 FINDINGS: Single frontal view of the chest demonstrates a stable cardiac silhouette. No acute airspace disease, effusion, or pneumothorax. Stable emphysema. No acute bony abnormality. Stable bilateral healed rib fractures. IMPRESSION: 1. Stable emphysema.  No acute intrathoracic process. Electronically Signed   By: Michael  Brown M.D.   On: 10/26/2021 23:56   °Family History °Reviewed and non-contributory, no pertinent history of problems with bleeding or anesthesia ° °  °  Review of Systems °14 system ROS conducted and negative except for that noted in HPI ° ° °OBJECTIVE ° °Vitals:Patient Vitals for the past 8 hrs: ° BP Temp Temp src Pulse Resp SpO2  °10/27/21 0627 136/72 98.3 °F (36.8 °C) Oral (!) 104 18 96 %  °10/27/21 0414 134/66 -- -- (!) 101 17 94 %  ° °General: Alert, no acute distress °Cardiovascular: Warm extremities noted °Respiratory: No cyanosis, no use of accessory musculature °GI: No organomegaly, abdomen is soft and non-tender °Skin: No lesions in the area of chief complaint other than those listed below in MSK exam.  °Neurologic: Sensation intact distally save for the below mentioned MSK exam °Psychiatric: Patient is competent for consent with normal mood and affect °Lymphatic: No swelling obvious and reported other than the area involved in the exam below ° °Extremities  °RUE: Patient has no obvious deltoid firing though it is quite difficult to test secondary to his pain.  He has some sensory function in his axillary nerve distribution but it is unclear on how abnormal it might be.  Unfortunately further testing limited by patient cooperation pain.  His hand is well-perfused but extraordinarily swollen.  He does have some blanching erythema in his mid humerus area which has reportedly improved. °LUE: actively moves left upper extremity without pain. NVI °Bilateral  lower extremity: actively moves bilateral lower extremity without pain. Non tender to palpation throughout. Neurovascularly intact.  °  ° °Test Results °Imaging °CT of right shoulder demonstrates "Severely comminuted and displaced fracture of the surgical neck of right proximal humerus. 4 cm of lateral displacement, 2 cm of °superior displacement. Numerous fracture fragments are located along °the anterior humeral head. Severely comminuted fracture of the °greater tuberosity and lesser tuberosity. Fracture cleft extends to °the superior articular surface." ° °Labs cbc °Recent Labs  °  10/26/21 °0601 10/27/21 °0223  °WBC 12.9* 15.5*  °HGB 10.4* 10.3*  °HCT 31.1* 30.5*  °PLT 552* 596*  ° ° ° °Labs inflam °No results for input(s): CRP in the last 72 hours. ° °Invalid input(s): ESR ° °  Labs coag °No results for input(s): INR, PTT in the last 72 hours. ° °Invalid input(s): PT ° °Recent Labs  °  10/26/21 °0601 10/27/21 °0223  °NA 131* 132*  °K 3.3* 3.9  °CL 95* 99  °CO2 24 21*  °GLUCOSE 106* 100*  °BUN 18 19  °CREATININE 1.69* 1.95*  °CALCIUM 8.5* 8.5*  ° ° ° ° °ASSESSMENT AND PLAN: °70 y.o. male with the following: Displaced right proximal humerus fracture ° °Orthopedics recommends admission to a medical service and we will provide consultation and follow along.  ° °This patient has an extraordinarily high risk of complication.  His x-ray nerve function is questionable.  He has an extraordinarily comminuted fracture and will require longstem revision implants.  Additionally his overall health condition makes him at high risk for infection as this is overall trauma to this extremity.  We discussed his redness in his upper arm with Dr. Krasinski who reports that it has been improving.  We feel that based on his overall picture it is unlikely to be a cellulitis and more likely to be related to his state found down on this extremity.  Additionally he has been on antibiotics for some time and will continue till the 25th.  We  think that additional time waiting to have surgery would not be in the patient's best interest as it is risk of infection may not decrease however his overall morbidity will increase significantly waiting for additional days.  Dislocation and infection are his most likely risks as his fracture fixation failure.  We will attempt to do a reverse total shoulder arthroplasty however if he has instability events we may have to resect and go towards a hemiarthroplasty.  We discussed all this with his sister his power of attorney. ° °The risks benefits and alternatives were discussed with the patient including but not limited to the risks of nonoperative treatment, versus surgical intervention including infection, bleeding, nerve injury, periprosthetic fracture, the need for revision surgery, blood clots, cardiopulmonary complications, morbidity, mortality, among others, and they were willing to proceed.   ° °We additionally specifically discussed risks of axillary nerve injury, infection, periprosthetic fracture, continued pain and longevity of implants prior to beginning procedure.   ° ° °- Weight Bearing Status/Activity: NWB RUE °- Additional recommended labs/tests: None °- VTE Prophylaxis: per primary °- Pain control: PRN pain medications °-Procedures: Right reverse total shoulder arthroplasty for fracture  °- NPO at midnight ° ° ° °

## 2021-10-27 NOTE — TOC Progression Note (Addendum)
Transition of Care [facility]) - Progression Note    Patient Details  Name: Edward Singleton MRN: [id number] Date of Birth: 09-Jan-1951  [facility] CM/SW Contact  Sharin Mons, RN Phone Number: 10/27/2021, 11:09 AM  Clinical Narrative:    Pt scheduled for the OR today, reverse arthroplasty ( proximal right humerus fracture) . NCM spoke with pt @ bedside regarding d/c planning. Pt states lives alone. Supportive sisters that live close by. PTA independent with ADL's, no DME usage. Pt agreeable to home health services if needed @ d/c. Pt without preference. Referral made with [facility]  and acceptance is for home health services.  TOC team following and will assist with needs...  10/27/2021 @ 11:24 am NCM received textl from [place]/ [place] agency unable to accept for home health services.  Expected Discharge Plan: [place] (vs home with home health services) Barriers to Discharge: Continued Medical Work up  Expected Discharge Plan and Services Expected Discharge Plan: [place] (vs home with home health services)   Discharge Planning Services: CM Consult                                 [place]: [place] Date [place]: 10/27/21 Time [place]: 1109 Representative spoke with at [place]: [place] ([place]) Interventions    Readmission Risk Interventions Readmission Risk Prevention Plan 03/15/2019  Transportation Screening Complete  PCP or Specialist Appt within 3-5 Days Complete  HRI or [place] Complete  Social Work Consult for [place] Planning/Counseling Complete  Palliative Care Screening Complete  Medication Review Press photographer) Complete  Some recent data might be hidden

## 2021-10-28 ENCOUNTER — Inpatient Hospital Stay (HOSPITAL_COMMUNITY): Payer: No Typology Code available for payment source

## 2021-10-28 LAB — GLUCOSE, CAPILLARY
Glucose-Capillary: 130 mg/dL — ABNORMAL HIGH (ref 70–99)
Glucose-Capillary: 129 mg/dL — ABNORMAL HIGH (ref 70–99)
Glucose-Capillary: 115 mg/dL — ABNORMAL HIGH (ref 70–99)
Glucose-Capillary: 121 mg/dL — ABNORMAL HIGH (ref 70–99)
Glucose-Capillary: 109 mg/dL — ABNORMAL HIGH (ref 70–99)

## 2021-10-28 LAB — BASIC METABOLIC PANEL
Anion gap: 10 (ref 5–15)
BUN: 21 mg/dL (ref 8–23)
CO2: 22 mmol/L (ref 22–32)
Calcium: 8 mg/dL — ABNORMAL LOW (ref 8.9–10.3)
Chloride: 102 mmol/L (ref 98–111)
Creatinine, Ser: 1.94 mg/dL — ABNORMAL HIGH (ref 0.61–1.24)
GFR, Estimated: 37 mL/min — ABNORMAL LOW (ref >60)
Glucose, Bld: 133 mg/dL — ABNORMAL HIGH (ref 70–99)
Potassium: 4.3 mmol/L (ref 3.5–5.1)
Sodium: 134 mmol/L — ABNORMAL LOW (ref 135–145)

## 2021-10-28 LAB — CBC
HCT: 26.4 % — ABNORMAL LOW (ref 39.0–52.0)
Hemoglobin: 8.4 g/dL — ABNORMAL LOW (ref 13.0–17.0)
MCH: 32.7 pg (ref 26.0–34.0)
MCHC: 31.8 g/dL (ref 30.0–36.0)
MCV: 102.7 fL — ABNORMAL HIGH (ref 80.0–100.0)
Platelets: 578 10*3/uL — ABNORMAL HIGH (ref 150–400)
RBC: 2.57 MIL/uL — ABNORMAL LOW (ref 4.22–5.81)
RDW: 18.6 % — ABNORMAL HIGH (ref 11.5–15.5)
WBC: 18.6 10*3/uL — ABNORMAL HIGH (ref 4.0–10.5)
nRBC: 0 % (ref 0.0–0.2)

## 2021-10-28 MED ORDER — HALOPERIDOL LACTATE 5 MG/ML IJ SOLN
1.0000 mg | Freq: Once | INTRAMUSCULAR | Status: AC
  Administered 2021-10-29: 1 mg via INTRAVENOUS
  Filled 2021-10-28: qty 1

## 2021-10-28 NOTE — Progress Notes (Signed)
Edward NOTE    PHARELL Singleton  TDD:[id number] DOB: 02/04/1951 DOA: 10/26/2021 PCP: Windy Fast, MD    Brief Narrative:  Edward Singleton is a pleasant 71 y.o. male with medical history significant for alcohol abuse, chronic renal insufficiency, anemia, hypertension, and type 2 diabetes mellitus who has been transferred from [facility] for operative management of proximal right humerus fracture.  He had presented to [facility] ED on 10/18/2021 after he had not been seen for several days and was found down at home after police forced entry, had significant bruising on his right arm, was confused, hypotensive, and hypothermic.  Per report of his sister, Edward Singleton, who is healthcare power of attorney, he had been binge drinking since the summer.   [facility] ED and Hospital Course: Upon arrival to the ED, patient was found to be hypotensive with systolic pressures in the 70s, hypothermic to 32.4 C.  He was found to be in rhabdomyolysis with AKI superimposed on CKD, had initial lactic acid greater than 8, elevated procalcitonin, proximal right humerus fracture, and prolonged QT interval.   He was fluid resuscitated, given warmed IV fluids initially, admitted to the ICU on 10/18/2021, was requiring Levophed, but was stabilized and off of Levophed by 10/20/2021.   He was seen by nephrology during the hospitalization, serum CK trended down, and his renal function returned to baseline.   He had a streptococcal bacteremia, was evaluated by ID, noted that this could possibly be related to a colon malignancy or contaminant, was treated with Rocephin, has since been transitioned to cefazolin, and it was recommended that he continue antibiotics through 11/03/2021 which could be changed to amoxicillin when he is discharged.   Orthopedic surgeon at [facility] discussed the case with Dr. Griffin Basil who agreed with transfer to [facility] with tentative plan for reverse arthroplasty on 10/27/2021.   1/18 plan to go to the OR today. 1/19 was  combative and pulling on everything.  Was given Ativan.  Now he is calmer.  During my assessment he is awake and oriented and calm.   Consultants:  Orthopedics  Procedures:   Antimicrobials:  Cefazolin   Subjective: Denies shortness of breath, chest pain.  Told patient not to pull on his wound VAC.  Objective: Vitals:   10/28/21 0820 10/28/21 1000 10/28/21 1200 10/28/21 1340  BP: 104/85 (!) 100/56 120/63 129/69  Pulse: 93 98 83 90  Resp:  16 15 18   Temp:    97.9 F (36.6 C)  TempSrc:    Oral  SpO2: 96% 97% 97% 91%  Weight:      Height:        Intake/Output Summary (Last 24 hours) at 10/28/2021 1419 Last data filed at 10/28/2021 0900 Gross per 24 hour  Intake 1220 ml  Output 420 ml  Net 800 ml   Filed Weights   10/27/21 2330  Weight: 72.6 kg    Examination: NAD, calm Mood and affect better now and appropriate in current setting. Awake oriented x3 CTA no wheezing Regular S1-S2 no gallops Soft benign positive bowel sounds Right wound VAC in place, no lower extremity edema Foley in place   Data Reviewed: I have personally reviewed following labs and imaging studies  CBC: Recent Labs  Lab 10/24/21 0456 10/25/21 0422 10/26/21 0601 10/27/21 0223 10/28/21 0256  WBC 19.1* 16.6* 12.9* 15.5* 18.6*  HGB 9.9* 10.3* 10.4* 10.3* 8.4*  HCT 28.8* 30.6* 31.1* 30.5* 26.4*  MCV 96.3 98.1 98.4 101.0* 102.7*  PLT 360 427* 552* 596*  193*   Basic Metabolic Panel: Recent Labs  Lab 10/22/21 0444 10/23/21 0516 10/24/21 0456 10/25/21 0422 10/26/21 0601 10/27/21 0223 10/28/21 0256  NA 134* 132* 133* 130* 131* 132* 134*  K 4.1 3.7 3.7 3.6 3.3* 3.9 4.3  CL 100 96* 99 96* 95* 99 102  CO2 26 25 23 24 24  21* 22  GLUCOSE 129* 115* 120* 117* 106* 100* 133*  BUN 23 19 17 17 18 19 21   CREATININE 1.91* 1.65* 1.67* 1.66* 1.69* 1.95* 1.94*  CALCIUM 8.4* 8.3* 8.6* 8.7* 8.5* 8.5* 8.0*  MG 1.6* 2.0  --   --   --  1.7  --   PHOS  --   --   --   --   --  2.9  --     GFR: Estimated Creatinine Clearance: 34.8 mL/min (A) (by C-G formula based on SCr of 1.94 mg/dL (H)). Liver Function Tests: No results for input(s): AST, ALT, ALKPHOS, BILITOT, PROT, ALBUMIN in the last 168 hours. No results for input(s): LIPASE, AMYLASE in the last 168 hours. No results for input(s): AMMONIA in the last 168 hours. Coagulation Profile: No results for input(s): INR, PROTIME in the last 168 hours. Cardiac Enzymes: Recent Labs  Lab 10/22/21 0444  CKTOTAL 581*   BNP (last 3 results) No results for input(s): PROBNP in the last 8760 hours. HbA1C: No results for input(s): HGBA1C in the last 72 hours. CBG: Recent Labs  Lab 10/27/21 1953 10/27/21 2329 10/28/21 0352 10/28/21 0714 10/28/21 1220  GLUCAP 142* 142* 130* 129* 115*   Lipid Profile: No results for input(s): CHOL, HDL, LDLCALC, TRIG, CHOLHDL, LDLDIRECT in the last 72 hours. Thyroid Function Tests: No results for input(s): TSH, T4TOTAL, FREET4, T3FREE, THYROIDAB in the last 72 hours. Anemia Panel: No results for input(s): VITAMINB12, FOLATE, FERRITIN, TIBC, IRON, RETICCTPCT in the last 72 hours. Sepsis Labs: No results for input(s): PROCALCITON, LATICACIDVEN in the last 168 hours.  Recent Results (from the past 240 hour(s))  Blood culture (routine x 2)     Status: Abnormal   Collection Time: 10/18/21  6:24 PM   Specimen: BLOOD  Result Value Ref Range Status   Specimen Description   Final    BLOOD FOOT Performed at [facility], [address] [place], [place] [address]    Special Requests   Final    BOTTLES DRAWN AEROBIC AND ANAEROBIC BCAV Performed at [facility], [address] [place], [place] [address]    Culture  Setup Time   Final    Organism ID to follow IN BOTH AEROBIC AND ANAEROBIC BOTTLES GRAM POSITIVE COCCI CRITICAL RESULT CALLED TO, READ BACK BY AND VERIFIED WITH: NATHAN BEULE 10/19/21 0602 MW    Culture (A)  Final    STREPTOCOCCUS  GALLOLYTICUS STAPHYLOCOCCUS EPIDERMIDIS THE SIGNIFICANCE OF ISOLATING THIS ORGANISM FROM A SINGLE SET OF BLOOD CULTURES WHEN MULTIPLE SETS ARE DRAWN IS UNCERTAIN. PLEASE NOTIFY THE MICROBIOLOGY DEPARTMENT WITHIN ONE WEEK IF SPECIATION AND SENSITIVITIES ARE REQUIRED. CRITICAL RESULT CALLED TO, READ BACK BY AND VERIFIED WITH: [place]. [id number] FCP Performed at [facility], [address] [place], [place] [address]    Report Status 10/24/2021 FINAL  Final   Organism ID, Bacteria STREPTOCOCCUS GALLOLYTICUS  Final      Susceptibility   Streptococcus gallolyticus - MIC*    PENICILLIN 0.12 SENSITIVE Sensitive     CEFTRIAXONE <=0.12 SENSITIVE Sensitive     ERYTHROMYCIN <=0.12 SENSITIVE Sensitive     LEVOFLOXACIN  2 SENSITIVE Sensitive     VANCOMYCIN 0.5 SENSITIVE Sensitive     * STREPTOCOCCUS GALLOLYTICUS  Blood Culture ID Panel (Reflexed)     Status: Abnormal   Collection Time: 10/18/21  6:24 PM  Result Value Ref Range Status   Enterococcus faecalis NOT DETECTED NOT DETECTED Final   Enterococcus Faecium NOT DETECTED NOT DETECTED Final   Listeria monocytogenes NOT DETECTED NOT DETECTED Final   Staphylococcus species NOT DETECTED NOT DETECTED Final   Staphylococcus aureus (BCID) NOT DETECTED NOT DETECTED Final   Staphylococcus epidermidis NOT DETECTED NOT DETECTED Final   Staphylococcus lugdunensis NOT DETECTED NOT DETECTED Final   Streptococcus species DETECTED (A) NOT DETECTED Final    Comment: Not Enterococcus species, Streptococcus agalactiae, Streptococcus pyogenes, or Streptococcus pneumoniae. CRITICAL RESULT CALLED TO, READ BACK BY AND VERIFIED WITH: NATHAN BEULE 10/19/21 0602 MW    Streptococcus agalactiae NOT DETECTED NOT DETECTED Final   Streptococcus pneumoniae NOT DETECTED NOT DETECTED Final   Streptococcus pyogenes NOT DETECTED NOT DETECTED Final   A.calcoaceticus-baumannii NOT DETECTED NOT DETECTED Final   Bacteroides fragilis NOT DETECTED NOT DETECTED Final    Enterobacterales NOT DETECTED NOT DETECTED Final   Enterobacter cloacae complex NOT DETECTED NOT DETECTED Final   Escherichia coli NOT DETECTED NOT DETECTED Final   Klebsiella aerogenes NOT DETECTED NOT DETECTED Final   Klebsiella oxytoca NOT DETECTED NOT DETECTED Final   Klebsiella pneumoniae NOT DETECTED NOT DETECTED Final   Proteus species NOT DETECTED NOT DETECTED Final   Salmonella species NOT DETECTED NOT DETECTED Final   Serratia marcescens NOT DETECTED NOT DETECTED Final   Haemophilus influenzae NOT DETECTED NOT DETECTED Final   Neisseria meningitidis NOT DETECTED NOT DETECTED Final   Pseudomonas aeruginosa NOT DETECTED NOT DETECTED Final   Stenotrophomonas maltophilia NOT DETECTED NOT DETECTED Final   Candida albicans NOT DETECTED NOT DETECTED Final   Candida auris NOT DETECTED NOT DETECTED Final   Candida glabrata NOT DETECTED NOT DETECTED Final   Candida krusei NOT DETECTED NOT DETECTED Final   Candida parapsilosis NOT DETECTED NOT DETECTED Final   Candida tropicalis NOT DETECTED NOT DETECTED Final   Cryptococcus neoformans/gattii NOT DETECTED NOT DETECTED Final    Comment: Performed at [facility], [address] [place], [place] [address]  Resp Panel by RT-PCR (Flu A&B, Covid) Nasopharyngeal Swab     Status: None   Collection Time: 10/18/21  8:16 PM   Specimen: Nasopharyngeal Swab; Nasopharyngeal(NP) swabs in vial transport medium  Result Value Ref Range Status   SARS Coronavirus 2 by RT PCR NEGATIVE NEGATIVE Final    Comment: (NOTE) SARS-CoV-2 target nucleic acids are NOT DETECTED.  The SARS-CoV-2 RNA is generally detectable in upper respiratory specimens during the acute phase of infection. The lowest concentration of SARS-CoV-2 viral copies this assay can detect is 138 copies/mL. A negative result does not preclude SARS-Cov-2 infection and should not be used as the sole basis for treatment or other patient management decisions. A negative result may occur  with  improper specimen collection/handling, submission of specimen other than nasopharyngeal swab, presence of viral mutation(s) within the areas targeted by this assay, and inadequate number of viral copies(<138 copies/mL). A negative result must be combined with clinical observations, patient history, and epidemiological information. The expected result is Negative.  Fact Sheet for Patients:  EntrepreneurPulse.com.au  Fact Sheet for Healthcare Providers:  IncredibleEmployment.be  This test is no t yet approved or cleared by the [place] FDA and  has been authorized for detection and/or  diagnosis of SARS-CoV-2 by FDA under an Emergency Use Authorization (EUA). This EUA will remain  in effect (meaning this test can be used) for the duration of the COVID-19 declaration under Section 564(b)(1) of the Act, 21 U.S.C.section 360bbb-3(b)(1), unless the authorization is terminated  or revoked sooner.       Influenza A by PCR NEGATIVE NEGATIVE Final   Influenza B by PCR NEGATIVE NEGATIVE Final    Comment: (NOTE) The Xpert Xpress SARS-CoV-2/FLU/RSV plus assay is intended as an aid in the diagnosis of influenza from Nasopharyngeal swab specimens and should not be used as a sole basis for treatment. Nasal washings and aspirates are unacceptable for Xpert Xpress SARS-CoV-2/FLU/RSV testing.  Fact Sheet for Patients: EntrepreneurPulse.com.au  Fact Sheet for Healthcare Providers: IncredibleEmployment.be  This test is not yet approved or cleared by the [place] FDA and has been authorized for detection and/or diagnosis of SARS-CoV-2 by FDA under an Emergency Use Authorization (EUA). This EUA will remain in effect (meaning this test can be used) for the duration of the COVID-19 declaration under Section 564(b)(1) of the Act, 21 U.S.C. section 360bbb-3(b)(1), unless the authorization is terminated  or revoked.  Performed at [facility], [address] [place], [place] [address]   Culture, blood (Routine X 2) w Reflex to ID Panel     Status: None   Collection Time: 10/18/21 11:28 PM   Specimen: BLOOD  Result Value Ref Range Status   Specimen Description BLOOD LEFT HAND  Final   Special Requests   Final    BOTTLES DRAWN AEROBIC ONLY Blood Culture adequate volume   Culture   Final    NO GROWTH 5 DAYS Performed at [facility], [address], [place], [place] [address]    Report Status 10/23/2021 FINAL  Final  MRSA Next Gen by PCR, Nasal     Status: None   Collection Time: 10/19/21  5:04 AM   Specimen: Nasal Mucosa; Nasal Swab  Result Value Ref Range Status   MRSA by PCR Next Gen NOT DETECTED NOT DETECTED Final    Comment: (NOTE) The GeneXpert MRSA Assay (FDA approved for NASAL specimens only), is one component of a comprehensive MRSA colonization surveillance program. It is not intended to diagnose MRSA infection nor to guide or monitor treatment for MRSA infections. Test performance is not FDA approved in patients less than 25 years old. Performed at [facility], [address] [place], [place] [address]   CULTURE, BLOOD (ROUTINE X 2) w Reflex to ID Panel     Status: None   Collection Time: 10/20/21  6:08 PM   Specimen: BLOOD  Result Value Ref Range Status   Specimen Description BLOOD LFOA  Final   Special Requests BOTTLES DRAWN AEROBIC AND ANAEROBIC BCAV  Final   Culture   Final    NO GROWTH 5 DAYS Performed at [facility], [address] [place], [place] [address]    Report Status 10/25/2021 FINAL  Final  CULTURE, BLOOD (ROUTINE X 2) w Reflex to ID Panel     Status: None   Collection Time: 10/20/21  6:23 PM   Specimen: BLOOD  Result Value Ref Range Status   Specimen Description BLOOD [facility]  Final   Special Requests BOTTLES DRAWN AEROBIC ONLY BCLV  Final   Culture   Final    NO GROWTH 5 DAYS Performed at [facility], [address] [place], [place] [address]    Report Status 10/25/2021 FINAL  Final  MRSA Next Gen by PCR, Nasal     Status: None   Collection Time: 10/27/21  6:39 AM   Specimen: Nasal Mucosa; Nasal Swab  Result Value Ref Range Status   MRSA by PCR Next Gen NOT DETECTED NOT DETECTED Final    Comment: (NOTE) The GeneXpert MRSA Assay (FDA approved for NASAL specimens only), is one component of a comprehensive MRSA colonization surveillance program. It is not intended to diagnose MRSA infection nor to guide or monitor treatment for MRSA infections. Test performance is not FDA approved in patients less than 62 years old. Performed at [facility], [place] [address] [place], [place] [address]   Aerobic/Anaerobic Culture w Gram Stain (surgical/deep wound)     Status: None (Preliminary result)   Collection Time: 10/27/21  2:53 PM   Specimen: PATH Other; Tissue  Result Value Ref Range Status   Specimen Description TISSUE  Final   Special Requests SHOULDER 1 SPEC A  Final   Gram Stain   Final    FEW WBC PRESENT, PREDOMINANTLY MONONUCLEAR NO ORGANISMS SEEN    Culture   Final    NO GROWTH < 24 HOURS Performed at [facility], [place] [address] [place], [place] [address]    Report Status PENDING  Incomplete  Aerobic/Anaerobic Culture w Gram Stain (surgical/deep wound)     Status: None (Preliminary result)   Collection Time: 10/27/21  2:54 PM   Specimen: PATH Other; Tissue  Result Value Ref Range Status   Specimen Description TISSUE  Final   Special Requests SHOULDER 2 SPEC B  Final   Gram Stain   Final    FEW WBC PRESENT, PREDOMINANTLY MONONUCLEAR NO ORGANISMS SEEN    Culture   Final    NO GROWTH < 24 HOURS Performed at [facility], [place] [address] [place], [place] [address]    Report Status PENDING  Incomplete         Radiology Studies: DG CHEST PORT 1 VIEW  Result Date: 10/26/2021 CLINICAL DATA:  Preoperative assessment, right humeral  fracture EXAM: PORTABLE CHEST 1 VIEW COMPARISON:  10/20/2021 FINDINGS: Single frontal view of the chest demonstrates a stable cardiac silhouette. No acute airspace disease, effusion, or pneumothorax. Stable emphysema. No acute bony abnormality. Stable bilateral healed rib fractures. IMPRESSION: 1. Stable emphysema.  No acute intrathoracic process. Electronically Signed   By: Randa Ngo M.D.   On: 10/26/2021 23:56   DG Shoulder Right Port  Result Date: 10/27/2021 CLINICAL DATA:  Status post right shoulder replacement EXAM: RIGHT SHOULDER - 1 VIEW COMPARISON:  10/18/2021 FINDINGS: Shoulder prosthesis is now seen in satisfactory position. Bony fragments are noted along the proximal humerus consistent with the prior injury. No acute bony abnormality is noted. IMPRESSION: Status post right shoulder replacement. Electronically Signed   By: Inez Catalina M.D.   On: 10/27/2021 19:06   DG Humerus Right  Result Date: 10/28/2021 CLINICAL DATA:  RIGHT shoulder reverse arthroplasty following traumatic fracture EXAM: RIGHT HUMERUS - 2+ VIEW COMPARISON:  Radiograph 1 9 23  FINDINGS: Interval reverse arthroplasty for repair of humeral neck fracture. Comminution about the fracture plane. Humerus returned to anatomical orientation. Prosthetic appears intact and located. Expected soft tissue changes superior to the joint. IMPRESSION: Reverse total arthroplasty for repair of humeral neck fracture. No complicating features Electronically Signed   By: Suzy Bouchard M.D.   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 Proximal right humerus fracture  - Presents in transfer [facility] for [facility]opedic surgery consult, likely reverse arthoplasty  1/19 status post reverse shoulder arthroplasty POD #1 Management per Ortho team Hold for shower for now Wound VAC in place Continue with sling Dressing left intact until follow-up, continue VAC for 1 week Follow-up in office in 1 week with orthopedics    2. Strep bacteremia   - Seen by ID at [facility], source unclear, subsequent cultures negative, Per ID-This bacteria can be associated with Colon malignancy and has the potential to cause endocarditis, but low bioburden and with staph epidermidis in the same culture contamination can be questioned  repeated  imaging of rt shoulder and arm and there was no abscess or collection- 1/19 patient was on ceftriaxone switched to cefazolin.  Will need antibiotics until 11/03/2021 continue IV while in the hospital when discharged can switch to p.o. to complete course with amoxicillin.         3. CAD; PAD  Asymptomatic currently without anginal complaints 1/19 continue statin  Off of aspirin resume when cleared by orthopedics       4. CKD IIIb  Close to baseline  Avoid nephrotoxin      5. Urinary retention  - Foley placed at [facility] and present on arrival here  -Since confused we will continue Foley today and discontinue it tomorrow for voiding trial     6. Hypokalemia; hyponatremia  Potassium now stable Sodium 131 today continue to monitor   7. Anemia  - He was transfused on 1/12  - Hgb  stable in 10-range last few days with no apparent bleeding     8. Alcohol abuse   - Unlikely to experience withdrawal at this point, continue b-vitamins and encourage alcohol avoidance going forward      DVT prophylaxis: SCD Code Status: Full Family Communication: None at bedside Disposition Plan: TBD Status is: Inpatient  Remains inpatient appropriate because: IV treatment.  Likely needs SNF.  PT pending           LOS: 2 days   Time spent: 35 minutes with more than 50% on [place], MD [facility] Pager [phone]  If 7PM-7AM, please contact night-coverage 10/28/2021, 2:19 PM

## 2021-10-28 NOTE — Evaluation (Signed)
Occupational Therapy Evaluation Patient Details Name: Edward Singleton MRN: [id number] DOB: 1950-12-28 Today's Date: 10/28/2021   History of Present Illness Pt is a 71 y/o M admitted on 10/18/21 after being found down in his house. Pt reports he had been drinking too much & fell. Pt was confused, hypothermic & hypotensive. Pt is being treated for septic 2/2 strep bacteremia & hypovolemic shoc, rhabdomylosis . Pt found to have closed comminuted R proximal humerus fx.  He was transferred to [facility] from [facility] on  11/17 pm and underwent reverse TSA on 11/18.  Wound VAC Placed on Rt UE.  PMH: chronic alcohol use, DM, alcoholic dementia, h/o pelvic fx, gout, HTN   Clinical Impression   Pt admitted with above. He demonstrates the below listed deficits and will benefit from continued OT to maximize safety and independence with BADLs.  Pt presents to OT with impaired cognition, decreased activity tolerance, decreased knowledge/awareness of precautions, impaired functional use of Rt UE.  He currently requires max A - total A for ADLs.  He is disoriented to time and situation today.  RN reports he has been very restless and just received Ativan.  PROM/AAROM performed Rt elbow distally, and sling readjusted as it was not over his forearm.   Per chart, pt lives alone with occasional check ins from family.  He will likely require SNF level rehab with likely need for ALF long term.  Will follow while in hospital.  Recommend PT consult        Recommendations for follow up therapy are one component of a multi-disciplinary discharge planning process, led by the attending physician.  Recommendations may be updated based on patient status, additional functional criteria and insurance authorization.   Follow Up Recommendations  Skilled nursing-short term rehab (<3 hours/day)    Assistance Recommended at Discharge Frequent or constant Supervision/Assistance  Patient can return home with the following A lot of help with  bathing/dressing/bathroom;Assistance with cooking/housework;Assist for transportation;Direct supervision/assist for medications management;Direct supervision/assist for financial management    Functional Status Assessment  Patient has had a recent decline in their functional status and demonstrates the ability to make significant improvements in function in a reasonable and predictable amount of time.  Equipment Recommendations  None recommended by OT    Recommendations for Other Services       Precautions / Restrictions Precautions Precautions: Fall;Shoulder Type of Shoulder Precautions: ROM elbow, wrist, and hand.  Sling at all times, and no ROM of shoulder Shoulder Interventions: Shoulder sling/immobilizer;At all times Precaution Booklet Issued: No Precaution Comments: Pt instructed in shoulder precautions, but is confused and unable to recall them Restrictions Weight Bearing Restrictions: Yes RUE Weight Bearing: Non weight bearing Other Position/Activity Restrictions: sling on at all times      Mobility Bed Mobility Overal bed mobility: Needs Assistance Bed Mobility: Rolling Rolling: Max assist              Transfers                   General transfer comment: deferred due to intermittent lethargy and confusion - had received Ativan      Balance                                           ADL either performed or assessed with clinical judgement   ADL Overall ADL's : Needs assistance/impaired Eating/Feeding: Moderate assistance;Bed  level   Grooming: Wash/dry hands;Wash/dry face;Oral care;Brushing hair;Moderate assistance;Bed level   Upper Body Bathing: Total assistance;Bed level   Lower Body Bathing: Total assistance;Bed level   Upper Body Dressing : Total assistance;Bed level   Lower Body Dressing: Total assistance;Bed level   Toilet Transfer: Total assistance   Toileting- Clothing Manipulation and Hygiene: Total assistance;Bed  level       Functional mobility during ADLs: Maximal assistance (bed mobility)       Vision         Perception     Praxis      Pertinent Vitals/Pain Pain Assessment Pain Assessment: PAINAD Breathing: normal Negative Vocalization: none Facial Expression: smiling or inexpressive Body Language: relaxed Consolability: no need to console PAINAD Score: 0 Pain Location: RUE Pain Intervention(s): Monitored during session     Hand Dominance Right   Extremity/Trunk Assessment Upper Extremity Assessment Upper Extremity Assessment: RUE deficits/detail RUE Deficits / Details: Sling on pt but it has slid up past elbow. Ice pack in place, wound VAC in place.   PROM/AAROM of elbow distally WFL.   Shoulder NT RUE: Unable to fully assess due to immobilization RUE Coordination: decreased fine motor;decreased gross motor   Lower Extremity Assessment Lower Extremity Assessment: Defer to PT evaluation   Cervical / Trunk Assessment Cervical / Trunk Assessment: Normal   Communication Communication Communication: No difficulties   Cognition Arousal/Alertness: Awake/alert, Lethargic Behavior During Therapy: Restless, WFL for tasks assessed/performed Overall Cognitive Status: Impaired/Different from baseline Area of Impairment: Orientation, Attention, Memory, Following commands, Safety/judgement, Awareness, Problem solving                 Orientation Level: Disoriented to, Situation, Time Current Attention Level: Focused Memory: Decreased short-term memory, Decreased recall of precautions Following Commands: Follows one step commands inconsistently Safety/Judgement: Decreased awareness of deficits, Decreased awareness of safety   Problem Solving: Slow processing, Decreased initiation, Difficulty sequencing, Requires verbal cues, Requires tactile cues General Comments: Pt confused.  RN reports she had just administered Ativan     General Comments       Exercises Exercises:  General Upper Extremity General Exercises - Upper Extremity Elbow Flexion: PROM, Right, 10 reps, Supine Elbow Extension: PROM, Right, 10 reps, Supine Wrist Flexion: PROM, AAROM, Right, 10 reps, Supine Wrist Extension: PROM, AAROM, Right, 10 reps, Supine Digit Composite Flexion: AROM, Right, 10 reps, Supine Composite Extension: PROM, AAROM, Right, 10 reps, Supine Other Exercises Other Exercises: Sling readjusted   Shoulder Instructions      Home Living Family/patient expects to be discharged to:: Skilled nursing facility Living Arrangements: Alone Available Help at Discharge: Family;Available PRN/intermittently Type of Home: House Home Access: Stairs to enter [employer] of Steps: 4 Entrance Stairs-Rails: Left Home Layout: One level               Home Equipment: Cane - single point   Additional Comments: Per chart, pt sister lays out medications, gets his gorceries and transports him to appointments      Prior Functioning/Environment Prior Level of Function : Independent/Modified Independent             Mobility Comments: Per chart review from [facility] - Pt reports independence without AD, lives alone. Sister reports she bring him groceries & manages his finance because he has a hx of alcohol/substance abuse.          OT Problem List: Decreased strength;Decreased range of motion;Decreased activity tolerance;Impaired balance (sitting and/or standing);Decreased coordination;Decreased cognition;Decreased safety awareness;Decreased knowledge of use of DME or  AE;Decreased knowledge of precautions;Cardiopulmonary status limiting activity;Impaired UE functional use      OT Treatment/Interventions: Self-care/ADL training;Therapeutic exercise;DME and/or AE instruction;Therapeutic activities;Cognitive remediation/compensation;Patient/family education;Balance training    OT Goals(Current goals can be found in the care plan section) Acute Rehab OT Goals Patient Stated  Goal: Pt did not state OT Goal Formulation: With patient Time For Goal Achievement: 11/11/21 Potential to Achieve Goals: Good ADL Goals Pt Will Perform Eating: with set-up;with supervision;sitting Pt Will Perform Grooming: with set-up;with supervision;sitting Pt Will Perform Upper Body Bathing: with mod assist;sitting Pt Will Perform Lower Body Bathing: with mod assist;sit to/from stand Pt Will Perform Upper Body Dressing: with mod assist;sitting Pt Will Perform Lower Body Dressing: with mod assist;sit to/from stand Pt Will Transfer to Toilet: with mod assist;stand pivot transfer;bedside commode Pt Will Perform Toileting - Clothing Manipulation and hygiene: with max assist;sit to/from stand Pt/caregiver will Perform Home Exercise Program: Right Upper extremity;With Supervision;With written HEP provided Additional ADL Goal #1: Pt will require min cues to adhere to shoulder precautions  OT Frequency: Min 2X/week    Co-evaluation              AM-PAC OT "6 Clicks" Daily Activity     Outcome Measure Help from another person eating meals?: A Lot Help from another person taking care of personal grooming?: A Lot Help from another person toileting, which includes using toliet, bedpan, or urinal?: Total Help from another person bathing (including washing, rinsing, drying)?: Total Help from another person to put on and taking off regular upper body clothing?: Total Help from another person to put on and taking off regular lower body clothing?: Total 6 Click Score: 8   End of Session Equipment Utilized During Treatment:  (sling) Nurse Communication: Mobility status  Activity Tolerance: Patient limited by lethargy Patient left: in bed;with call bell/phone within reach;with bed alarm set;with restraints reapplied  OT Visit Diagnosis: Cognitive communication deficit (A76.811)                Time: 5726-2035 OT Time Calculation (min): 13 min Charges:  OT General Charges $OT Visit: 1  Visit OT Evaluation $OT Eval Moderate Complexity: 1 Mod  Nilsa Nutting., OTR/L Acute Rehabilitation Services Pager [phone] Office [phone]   Lucille Passy M 10/28/2021, 1:09 PM

## 2021-10-28 NOTE — Progress Notes (Signed)
Pt found w/wound vac line not attached. This RN reinforced dressing. Pt is confused and takes mittens off. Educated pt on importamce to not interfere with medical equipment.

## 2021-10-28 NOTE — Progress Notes (Signed)
Spoke to MD Nolberto Hanlon and was given a verbal order to place an order for a sitter and for 1 mg of Haldol x1. MD aware of pt pulling at lines, wound vac, foley, extremely confused and very difficult to reorient. Mitts have been tried and pt is near nurses station and still in and out of pt room often. Floor mats were implemented as well.

## 2021-10-28 NOTE — Progress Notes (Signed)
Patient has been exhibiting confusion, agitation, restlessness and hallucination overnight. Pt attempts to pull out  wound vac dressing,foley catheter and IV. Pt insisting to go out and smoke cigarette. Ativan IV/PO given based on CIWA score with mild effect.

## 2021-10-28 NOTE — Progress Notes (Signed)
° °  ORTHOPAEDIC PROGRESS NOTE  s/p Procedure(s): REVERSE SHOULDER ARTHROPLASTY  SUBJECTIVE: Patient resting comfortably in hospital bed. He states he is having minimal to no pain in the right upper arm. States the pain in his forearm has improved as well.  OBJECTIVE: PE: General: resting comfortably in hospital bed, NAD RUE: Prevena wound vac in place with good seal. Approximately 50 cc of bloody drainage in canister.  Sling well fitting. He endorses axillary nerve sensation. Continues to have erythema of the right upper arm which is stable in appearance. Swelling of left forearm and hand which appears to be stable as well. Difficult to get distal motor function exam due to patient cooperation. He is able to wiggle all fingers. Actively flexes and extends his wrist. He endorses sensation intact in medial, radial, and ulnar distributions. Well perfused digits.    Vitals:   10/27/21 2330 10/28/21 0100  BP: 120/88 106/72  Pulse: 88 83  Resp: 16 18  Temp: (!) 97.3 F (36.3 C)   SpO2:  94%   Cultures from right shoulder - no growth, results pending  ASSESSMENT: Edward Singleton is a 72 y.o. male POD#1  PLAN: Weightbearing: NWB RUE  -  NO ROM of right shoulder for 4 weeks.  - Okay for wrist/hand exercises.  Insicional and dressing care: Dressings left intact until follow-up Continue Prevena wound VAC for 1 week Orthopedic device(s): Wound MWN:[id number], Sling Showering: Hold for now ABLA: Hgb 8.4 today, continue to monitor VTE prophylaxis: per primary team, no orthopedic contraindications Pain control: PRN pain medications, minimize narcotics as able Follow - up plan: 1 week in office  Continue IV antibiotics with transition to amoxicillin at discharge as recommended by primary team. He is extremely high risk of infection, dislocation, loss of fixation and wound dehiscence.     Contact information:  Weekdays 8-5 Dr. Ophelia Charter, Noemi Chapel PA-C, After hours and holidays please  check [email or web address] for group call information for [facility]   Noemi Chapel, PA-C 10/28/2021

## 2021-10-28 NOTE — TOC CAGE-AID Note (Signed)
Transition of Care [facility]) - CAGE-AID Screening   Patient Details  Name: Edward Singleton MRN: [id number] Date of Birth: 17-Oct-1950  Transition of [facility]) CM/SW Contact:    Renada Cronin C Tarpley-Carter, [place] Phone Number: 10/28/2021, 12:03 PM   Clinical Narrative: Pt is unable to participate in Cage Aid. Pt is confused at this time.  CSW will assess at a better time.  Sarayu Prevost Tarpley-Carter, MSW, LCSW-A Pronouns:  She/Her/Hers [place] Transitions of Care Clinical Social Worker Direct Number:  [phone] [email or web address]  CAGE-AID Screening: Substance Abuse Screening unable to be completed due to: : Patient unable to participate (Pt is confused at this time.)             Substance Abuse Education Offered: Yes  Substance abuse interventions: Scientist, clinical (histocompatibility and immunogenetics)

## 2021-10-29 DIAGNOSIS — D72829 Elevated white blood cell count, unspecified: Secondary | ICD-10-CM

## 2021-10-29 DIAGNOSIS — T796XXD Traumatic ischemia of muscle, subsequent encounter: Secondary | ICD-10-CM

## 2021-10-29 DIAGNOSIS — F1027 Alcohol dependence with alcohol-induced persisting dementia: Secondary | ICD-10-CM

## 2021-10-29 LAB — CBC
HCT: 26.4 % — ABNORMAL LOW (ref 39.0–52.0)
Hemoglobin: 8.8 g/dL — ABNORMAL LOW (ref 13.0–17.0)
MCH: 34.4 pg — ABNORMAL HIGH (ref 26.0–34.0)
MCHC: 33.3 g/dL (ref 30.0–36.0)
MCV: 103.1 fL — ABNORMAL HIGH (ref 80.0–100.0)
Platelets: 660 10*3/uL — ABNORMAL HIGH (ref 150–400)
RBC: 2.56 MIL/uL — ABNORMAL LOW (ref 4.22–5.81)
RDW: 19.1 % — ABNORMAL HIGH (ref 11.5–15.5)
WBC: 22.5 10*3/uL — ABNORMAL HIGH (ref 4.0–10.5)
nRBC: 0.1 % (ref 0.0–0.2)

## 2021-10-29 LAB — BASIC METABOLIC PANEL
Anion gap: 12 (ref 5–15)
BUN: 18 mg/dL (ref 8–23)
CO2: 24 mmol/L (ref 22–32)
Calcium: 8.3 mg/dL — ABNORMAL LOW (ref 8.9–10.3)
Chloride: 99 mmol/L (ref 98–111)
Creatinine, Ser: 1.8 mg/dL — ABNORMAL HIGH (ref 0.61–1.24)
GFR, Estimated: 40 mL/min — ABNORMAL LOW (ref >60)
Glucose, Bld: 100 mg/dL — ABNORMAL HIGH (ref 70–99)
Potassium: 3.2 mmol/L — ABNORMAL LOW (ref 3.5–5.1)
Sodium: 135 mmol/L (ref 135–145)

## 2021-10-29 LAB — GLUCOSE, CAPILLARY
Glucose-Capillary: 101 mg/dL — ABNORMAL HIGH (ref 70–99)
Glucose-Capillary: 104 mg/dL — ABNORMAL HIGH (ref 70–99)
Glucose-Capillary: 109 mg/dL — ABNORMAL HIGH (ref 70–99)
Glucose-Capillary: 115 mg/dL — ABNORMAL HIGH (ref 70–99)
Glucose-Capillary: 116 mg/dL — ABNORMAL HIGH (ref 70–99)
Glucose-Capillary: 118 mg/dL — ABNORMAL HIGH (ref 70–99)
Glucose-Capillary: 93 mg/dL (ref 70–99)

## 2021-10-29 MED ORDER — OXYCODONE HCL 5 MG PO TABS
5.0000 mg | ORAL_TABLET | Freq: Four times a day (QID) | ORAL | Status: DC | PRN
  Administered 2021-10-29 – 2021-11-02 (×10): 5 mg via ORAL
  Filled 2021-10-29 (×9): qty 1

## 2021-10-29 MED ORDER — ACETAMINOPHEN 325 MG PO TABS
650.0000 mg | ORAL_TABLET | Freq: Four times a day (QID) | ORAL | Status: DC | PRN
  Administered 2021-10-31 – 2021-11-01 (×2): 650 mg via ORAL
  Filled 2021-10-29 (×2): qty 2

## 2021-10-29 MED ORDER — OXYCODONE HCL 5 MG PO TABS
7.5000 mg | ORAL_TABLET | Freq: Four times a day (QID) | ORAL | Status: DC | PRN
  Administered 2021-10-30 – 2021-11-02 (×4): 7.5 mg via ORAL
  Filled 2021-10-29 (×5): qty 2

## 2021-10-29 NOTE — Progress Notes (Addendum)
PROGRESS NOTE    TRAEVION POEHLER  BJS:[id number] DOB: 1950/10/17 DOA: 10/26/2021 PCP: Windy Fast, MD    Brief Narrative:  KEAUN SCHNABEL is a pleasant 71 y.o. male with medical history significant for alcohol abuse, chronic renal insufficiency, anemia, hypertension, and type 2 diabetes mellitus who has been transferred from [facility] for operative management of proximal right humerus fracture.  He had presented to [facility] ED on 10/18/2021 after he had not been seen for several days and was found down at home after police forced entry, had significant bruising on his right arm, was confused, hypotensive, and hypothermic.  Per report of his sister, [name], who is healthcare power of attorney, he had been binge drinking since the summer.   [facility] ED and Hospital Course: Upon arrival to the ED, patient was found to be hypotensive with systolic pressures in the 70s, hypothermic to 32.4 C.  He was found to be in rhabdomyolysis with AKI superimposed on CKD, had initial lactic acid greater than 8, elevated procalcitonin, proximal right humerus fracture, and prolonged QT interval.   He was fluid resuscitated, given warmed IV fluids initially, admitted to the ICU on 10/18/2021, was requiring Levophed, but was stabilized and off of Levophed by 10/20/2021.   He was seen by nephrology during the hospitalization, serum CK trended down, and his renal function returned to baseline.   He had a streptococcal bacteremia, was evaluated by ID, noted that this could possibly be related to a colon malignancy or contaminant, was treated with Rocephin, has since been transitioned to cefazolin, and it was recommended that he continue antibiotics through 11/03/2021 which could be changed to amoxicillin when he is discharged.   Orthopedic surgeon at [facility] discussed the case with Dr. Griffin Basil who agreed with transfer to [facility] with tentative plan for reverse arthroplasty on 10/27/2021.   1/18 plan to go to the OR today. 1/19 was  combative and pulling on everything.  Was given Ativan.  Now he is calmer.  During my assessment he is awake and oriented and calm. 1/20 sleeping , does not wake up for me  Consultants:  Orthopedics  Procedures:   Antimicrobials:  Cefazolin   Subjective:  As above  Objective: Vitals:   10/28/21 1700 10/28/21 2156 10/29/21 0808 10/29/21 1113  BP: 126/61 113/63 139/60 110/71  Pulse: 96 87 92 100  Resp:  17 18 19   Temp:  98 F (36.7 C) 98.6 F (37 C) 98.4 F (36.9 C)  TempSrc:  Oral Oral Oral  SpO2:  95% 96% 93%  Weight:      Height:        Intake/Output Summary (Last 24 hours) at 10/29/2021 1146 Last data filed at 10/29/2021 1059 Gross per 24 hour  Intake 820 ml  Output 2140 ml  Net -1320 ml   Filed Weights   10/27/21 2330  Weight: 72.6 kg    Examination: Sleeping, nad Cta no wheezing Reg s1/s2 no gallop Soft being +bs No edema Unable to assess mood or cns   Data Reviewed: I have personally reviewed following labs and imaging studies  CBC: Recent Labs  Lab 10/25/21 0422 10/26/21 0601 10/27/21 0223 10/28/21 0256 10/29/21 0237  WBC 16.6* 12.9* 15.5* 18.6* 22.5*  HGB 10.3* 10.4* 10.3* 8.4* 8.8*  HCT 30.6* 31.1* 30.5* 26.4* 26.4*  MCV 98.1 98.4 101.0* 102.7* 103.1*  PLT 427* 552* 596* 578* 607*   Basic Metabolic Panel: Recent Labs  Lab 10/23/21 0516 10/24/21 0456 10/25/21 0422 10/26/21 0601 10/27/21  5465 10/28/21 0256 10/29/21 0237  NA 132*   < > 130* 131* 132* 134* 135  K 3.7   < > 3.6 3.3* 3.9 4.3 3.2*  CL 96*   < > 96* 95* 99 102 99  CO2 25   < > 24 24 21* 22 24  GLUCOSE 115*   < > 117* 106* 100* 133* 100*  BUN 19   < > 17 18 19 21 18   CREATININE 1.65*   < > 1.66* 1.69* 1.95* 1.94* 1.80*  CALCIUM 8.3*   < > 8.7* 8.5* 8.5* 8.0* 8.3*  MG 2.0  --   --   --  1.7  --   --   PHOS  --   --   --   --  2.9  --   --    < > = values in this interval not displayed.   GFR: Estimated Creatinine Clearance: 37.5 mL/min (A) (by C-G formula based on  SCr of 1.8 mg/dL (H)). Liver Function Tests: No results for input(s): AST, ALT, ALKPHOS, BILITOT, PROT, ALBUMIN in the last 168 hours. No results for input(s): LIPASE, AMYLASE in the last 168 hours. No results for input(s): AMMONIA in the last 168 hours. Coagulation Profile: No results for input(s): INR, PROTIME in the last 168 hours. Cardiac Enzymes: No results for input(s): CKTOTAL, CKMB, CKMBINDEX, TROPONINI in the last 168 hours.  BNP (last 3 results) No results for input(s): PROBNP in the last 8760 hours. HbA1C: No results for input(s): HGBA1C in the last 72 hours. CBG: Recent Labs  Lab 10/28/21 2057 10/29/21 0014 10/29/21 0414 10/29/21 0820 10/29/21 1109  GLUCAP 109* 116* 104* 93 109*   Lipid Profile: No results for input(s): CHOL, HDL, LDLCALC, TRIG, CHOLHDL, LDLDIRECT in the last 72 hours. Thyroid Function Tests: No results for input(s): TSH, T4TOTAL, FREET4, T3FREE, THYROIDAB in the last 72 hours. Anemia Panel: No results for input(s): VITAMINB12, FOLATE, FERRITIN, TIBC, IRON, RETICCTPCT in the last 72 hours. Sepsis Labs: No results for input(s): PROCALCITON, LATICACIDVEN in the last 168 hours.  Recent Results (from the past 240 hour(s))  CULTURE, BLOOD (ROUTINE X 2) w Reflex to ID Panel     Status: None   Collection Time: 10/20/21  6:08 PM   Specimen: BLOOD  Result Value Ref Range Status   Specimen Description BLOOD LFOA  Final   Special Requests BOTTLES DRAWN AEROBIC AND ANAEROBIC BCAV  Final   Culture   Final    NO GROWTH 5 DAYS Performed at [facility], [address] [place], [place] [address]    Report Status 10/25/2021 FINAL  Final  CULTURE, BLOOD (ROUTINE X 2) w Reflex to ID Panel     Status: None   Collection Time: 10/20/21  6:23 PM   Specimen: BLOOD  Result Value Ref Range Status   Specimen Description BLOOD [facility]  Final   Special Requests BOTTLES DRAWN AEROBIC ONLY BCLV  Final   Culture   Final    NO GROWTH 5 DAYS Performed at  [facility], [address] [place], [place] [address]    Report Status 10/25/2021 FINAL  Final  MRSA Next Gen by PCR, Nasal     Status: None   Collection Time: 10/27/21  6:39 AM   Specimen: Nasal Mucosa; Nasal Swab  Result Value Ref Range Status   MRSA by PCR Next Gen NOT DETECTED NOT DETECTED Final    Comment: (NOTE) The GeneXpert MRSA Assay (FDA approved for NASAL specimens only),  is one component of a comprehensive MRSA colonization surveillance program. It is not intended to diagnose MRSA infection nor to guide or monitor treatment for MRSA infections. Test performance is not FDA approved in patients less than 75 years old. Performed at [facility], [place] [address], [place], [place] [address]   Aerobic/Anaerobic Culture w Gram Stain (surgical/deep wound)     Status: None (Preliminary result)   Collection Time: 10/27/21  2:53 PM   Specimen: PATH Other; Tissue  Result Value Ref Range Status   Specimen Description TISSUE  Final   Special Requests SHOULDER 1 SPEC A  Final   Gram Stain   Final    FEW WBC PRESENT, PREDOMINANTLY MONONUCLEAR NO ORGANISMS SEEN    Culture   Final    NO GROWTH < 24 HOURS Performed at [facility], [place] [address] [place], [place] [address]    Report Status PENDING  Incomplete  Aerobic/Anaerobic Culture w Gram Stain (surgical/deep wound)     Status: None (Preliminary result)   Collection Time: 10/27/21  2:54 PM   Specimen: PATH Other; Tissue  Result Value Ref Range Status   Specimen Description TISSUE  Final   Special Requests SHOULDER 2 SPEC B  Final   Gram Stain   Final    FEW WBC PRESENT, PREDOMINANTLY MONONUCLEAR NO ORGANISMS SEEN    Culture   Final    NO GROWTH < 24 HOURS Performed at [facility], [place] [address] [place], [place] [address]    Report Status PENDING  Incomplete         Radiology Studies: DG Shoulder Right Port  Result Date: 10/27/2021 CLINICAL DATA:  Status post right shoulder  replacement EXAM: RIGHT SHOULDER - 1 VIEW COMPARISON:  10/18/2021 FINDINGS: Shoulder prosthesis is now seen in satisfactory position. Bony fragments are noted along the proximal humerus consistent with the prior injury. No acute bony abnormality is noted. IMPRESSION: Status post right shoulder replacement. Electronically Signed   By: Inez Catalina M.D.   On: 10/27/2021 19:06   DG Humerus Right  Result Date: 10/28/2021 CLINICAL DATA:  RIGHT shoulder reverse arthroplasty following traumatic fracture EXAM: RIGHT HUMERUS - 2+ VIEW COMPARISON:  Radiograph 1 9 23  FINDINGS: Interval reverse arthroplasty for repair of humeral neck fracture. Comminution about the fracture plane. Humerus returned to anatomical orientation. Prosthetic appears intact and located. Expected soft tissue changes superior to the joint. IMPRESSION: Reverse total arthroplasty for repair of humeral neck fracture. No complicating features Electronically Signed   By: Suzy Bouchard M.D.   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 Proximal right humerus fracture  - Presents in transfer from [facility] for orthopedic surgery consult, likely reverse arthoplasty   1/19 status post reverse shoulder arthroplasty POD #1 Management per Ortho team Hold for shower for now Wound VAC in place Continue with sling Dressing left intact until follow-up, continue VAC for 1 week Follow-up in office in 1 week with orthopedics 1/20 PT OT recommend SNF    2. Strep bacteremia   - Seen by ID at [facility], source unclear, subsequent cultures negative, Per ID-This bacteria can be associated with Colon malignancy and has the potential to cause endocarditis, but low bioburden and with staph epidermidis in the same culture contamination can be questioned  repeated  imaging of rt shoulder and arm and there was no abscess or collection- 1/20 patient was on ceftriaxone switched to cefazolin.  Will need antibiotics until 11/03/2021.  Continue IV while in the hospital and discharge on amoxicillin to complete the course.   patient was on ceftriaxone switched to cefazolin.  Will need antibiotics    3. Leukocytosis Wbc continues to go up. Febrile currently Will DC Foley and do voiding trial Continue IV antibiotics as above If WBC continues to increase will repeat blood cultures and consider consulting ID    34. CAD; PAD  Asymptomatic currently without anginal complaints 1/20 continue statin Will resume aspirin as he was cleared by orthopedics via chat      4. CKD IIIb  Close to baseline  Avoid nephrotoxin      5. Urinary retention  - Foley placed at [facility] and present on arrival here  1/20- will dc foley. Will do voiding trial today.  Will bladder scan 24 hrs , in and out cath if scan >270ml.     6. Hypokalemia; hyponatremia  Repalce potassium today with kcl 43meq x1 Na stable    7. Anemia  - He was transfused on 1/12  - Hgb stable in 10-range last few days with no apparent bleeding     8. Alcohol abuse   - Unlikely to experience withdrawal at this point, continue b-vitamins and encourage alcohol avoidance going forward      DVT prophylaxis:  SCD Code Status: Full Family Communication: None at bedside Disposition Plan: TBD Status is: Inpatient  Remains inpatient appropriate because: IV treatment.  SNF pending. Wbc keeps going up            LOS: 3 days   Time spent: 45 minutes with more than 50% on [place], MD [facility] Pager [phone]  If 7PM-7AM, please contact night-coverage 10/29/2021, 11:46 AM

## 2021-10-29 NOTE — Evaluation (Signed)
Physical Therapy Evaluation Patient Details Name: Edward Singleton MRN: [id number] DOB: 03-Sep-1951 Today's Date: 10/29/2021  History of Present Illness  Pt is a 71 y/o M admitted on 10/27/21 after being found down in his house. Pt reports he had been drinking too much & fell. Pt was confused, hypothermic & hypotensive. Pt is being treated for septic 2/2 strep bacteremia & hypovolemic shoc, rhabdomylosis . Pt found to have closed comminuted R proximal humerus fx.  He was transferred to [facility] from [facility] on  11/17 pm and underwent reverse TSA on 11/18.  Wound VAC Placed on Rt UE.  PMH: chronic alcohol use, DM, alcoholic dementia, h/o pelvic fx, gout, HTN  Clinical Impression  Patient presents with decreased functional use of RUE, impaired balance, cognitive deficits, generalized weakness/debility and impaired mobility s/p above. Pt seems less confused than yesterday however still having deficits related to memory, orientation, problem solving, awareness. Requires Mod A for bed mobility and Min A for short distance ambulation; noted to have impaired balance and be tremulous throughout movement. Pt with poor awareness of safety/deficits; upon entering room, mitts and sling off with pt laying on RUE. Per chart review, pt lives alone with check ins from family so will likely need SNF at d/c with likely transition to ALF for long term. Will follow acutely.     Recommendations for follow up therapy are one component of a multi-disciplinary discharge planning process, led by the attending physician.  Recommendations may be updated based on patient status, additional functional criteria and insurance authorization.  Follow Up Recommendations Skilled nursing-short term rehab (<3 hours/day)    Assistance Recommended at Discharge Intermittent Supervision/Assistance  Patient can return home with the following  A little help with walking and/or transfers;A little help with bathing/dressing/bathroom;Direct  supervision/assist for financial management;Help with stairs or ramp for entrance;Assist for transportation;Direct supervision/assist for medications management;Assistance with cooking/housework    Equipment Recommendations Other (comment) (TBA)  Recommendations for Other Services       Functional Status Assessment Patient has had a recent decline in their functional status and demonstrates the ability to make significant improvements in function in a reasonable and predictable amount of time.     Precautions / Restrictions Precautions Precautions: Fall;Shoulder Type of Shoulder Precautions: ROM elbow, wrist, and hand.  Sling at all times, and no ROM of shoulder Shoulder Interventions: Shoulder sling/immobilizer;At all times Precaution Booklet Issued: No Required Braces or Orthoses: Sling Restrictions Weight Bearing Restrictions: Yes RUE Weight Bearing: Non weight bearing Other Position/Activity Restrictions: sling on at all times      Mobility  Bed Mobility Overal bed mobility: Needs Assistance Bed Mobility: Sidelying to Sit   Sidelying to sit: Mod assist, HOB elevated       General bed mobility comments: Assist with trunk and scooting bottom to get to EOB, laying on RUE but assist to maintain NWB when coming to sitting. Sling doffed.    Transfers Overall transfer level: Needs assistance Equipment used: 1 person hand held assist Transfers: Sit to/from Stand Sit to Stand: Min assist           General transfer comment: ASsist to power to standing and for balance; wide BoS and tremulous.    Ambulation/Gait Ambulation/Gait assistance: Min assist Gait Distance (Feet): 20 Feet Assistive device: 1 person hand held assist Gait Pattern/deviations: Step-through pattern, Decreased stride length, Wide base of support Gait velocity: decreased     General Gait Details: Slow, guarded and unsteady gait with wide BoS, HHA att imes for  balance/safety. Tremuous  throughout  [employer] [place] (Stroke Patients Only)       Balance Overall balance assessment: Needs assistance Sitting-balance support: Feet supported, No upper extremity supported Sitting balance-Leahy Scale: Fair Sitting balance - Comments: supervision sitting EOB   Standing balance support: During functional activity Standing balance-Leahy Scale: Fair Standing balance comment: Requires MIN GUARD fot static stand balance and Min A for walking                             Pertinent Vitals/Pain Pain Assessment Pain Assessment: No/denies pain    Home Living Family/patient expects to be discharged to:: Skilled nursing facility Living Arrangements: Alone Available Help at Discharge: Family;Available PRN/intermittently Type of Home: House Home Access: Stairs to enter Entrance Stairs-Rails: Left Entrance Stairs-Number of Steps: 4   Home Layout: One level Home Equipment: Cane - single point Additional Comments: Per chart, pt sister lays out medications, gets his gorceries and transports him to appointments    Prior Function Prior Level of Function : Independent/Modified Independent             Mobility Comments: Per chart review from [facility] - Pt reports independence without AD, lives alone. Sister reports she bring him groceries & manages his finance because he has a hx of alcohol/substance abuse.       Hand Dominance   Dominant Hand: Right    Extremity/Trunk Assessment   Upper Extremity Assessment Upper Extremity Assessment: Defer to OT evaluation RUE Deficits / Details: No sling donned, laying on shoulder upon arrival,. Donned sling. Able to move digits/wrist.    Lower Extremity Assessment Lower Extremity Assessment: Generalized weakness (tremulous but functional)    Cervical / Trunk Assessment Cervical / Trunk Assessment: Normal  Communication   Communication: No difficulties  Cognition  Arousal/Alertness: Awake/alert Behavior During Therapy: WFL for tasks assessed/performed Overall Cognitive Status: Impaired/Different from baseline Area of Impairment: Orientation, Attention, Memory, Following commands, Safety/judgement, Awareness, Problem solving                 Orientation Level: Disoriented to, Situation Current Attention Level: Sustained Memory: Decreased short-term memory, Decreased recall of precautions Following Commands: Follows one step commands consistently Safety/Judgement: Decreased awareness of deficits, Decreased awareness of safety   Problem Solving: Slow processing, Decreased initiation, Difficulty sequencing, Requires verbal cues, Requires tactile cues General Comments: Less confused today; upon entering room, pt with mitts off and sling off. Knows he fell from drinking too much but not able to state anything about his shoulder precautions.        General Comments      Exercises     Assessment/Plan    PT Assessment Patient needs continued PT services  PT Problem List Decreased strength;Decreased mobility;Decreased safety awareness;Decreased activity tolerance;Decreased balance;Decreased knowledge of use of DME;Decreased skin integrity;Decreased knowledge of precautions;Decreased range of motion       PT Treatment Interventions DME instruction;Therapeutic exercise;Gait training;Balance training;Stair training;Neuromuscular re-education;Functional mobility training;Therapeutic activities;Patient/family education;Modalities    PT Goals (Current goals can be found in the Care Plan section)  Acute Rehab PT Goals Patient Stated Goal: get better and shower PT Goal Formulation: With patient Time For Goal Achievement: 11/12/21 Potential to Achieve Goals: Good    Frequency Min 3X/week     Co-evaluation               AM-PAC PT "6  Clicks" Mobility  Outcome Measure Help needed turning from your back to your side while in a flat bed  without using bedrails?: A Little Help needed moving from lying on your back to sitting on the side of a flat bed without using bedrails?: A Lot Help needed moving to and from a bed to a chair (including a wheelchair)?: A Little Help needed standing up from a chair using your arms (e.g., wheelchair or bedside chair)?: A Little Help needed to walk in hospital room?: A Little Help needed climbing 3-5 steps with a railing? : A Lot 6 Click Score: 16    End of Session Equipment Utilized During Treatment: Gait belt Activity Tolerance: Patient tolerated treatment well Patient left: in bed;with call bell/phone within reach;with bed alarm set (sitting EOB eating breakfast, no chair alarms no deferred chair transfers, tele sitter present) Nurse Communication: Mobility status PT Visit Diagnosis: Unsteadiness on feet (R26.81);Muscle weakness (generalized) (M62.81);Difficulty in walking, not elsewhere classified (R26.2)    Time: 7322-5672 PT Time Calculation (min) (ACUTE ONLY): 25 min   Charges:   PT Evaluation $PT Eval Moderate Complexity: 1 Mod PT Treatments $Therapeutic Activity: 8-22 mins        Marisa Severin, PT, DPT Acute Rehabilitation Services Pager [phone] Office [phone]     Marguarite Arbour A Sabra Heck 10/29/2021, 9:42 AM

## 2021-10-29 NOTE — TOC CAGE-AID Note (Signed)
Transition of Care [facility]) - CAGE-AID Screening   Patient Details  Name: Edward Singleton MRN: [id number] Date of Birth: [birth year]  Transition of Care [facility]) CM/SW Contact:    Gaetano Hawthorne Tarpley-Carter, LCSWA Phone Number: 10/29/2021, 3:05 PM   Clinical Narrative: Pt participated in [place].  Pt stated he does not use substance or ETOH.  Pt stated he quit drinking 4 weeks ago.  Pt was  offered resources, due to history of drinking ETOH.  Pt is interested in outpt resources.  Demita Tobia Tarpley-Carter, MSW, LCSW-A Pronouns:  She/Her/Hers  Transitions of Care Clinical Social Worker Direct Number:  [phone] [email or web address]  CAGE-AID Screening: Substance Abuse Screening unable to be completed due to: : Patient unable to participate (Pt is confused at this time.)  Have You Ever Felt You Ought to Cut Down on Your Drinking or Drug Use?: No Have People Annoyed You By Critizing Your Drinking Or Drug Use?: No Have You Felt Bad Or Guilty About Your Drinking Or Drug Use?: No Have You Ever Had a Drink or Used Drugs First Thing In The Morning to Steady Your Nerves or to Get Rid of a Hangover?: No CAGE-AID Score: 0  Substance Abuse Education Offered: Yes (Pt stated he quit drinking 4 weeks ago.)  Substance abuse interventions: Scientist, clinical (histocompatibility and immunogenetics)

## 2021-10-29 NOTE — Progress Notes (Signed)
° °  ORTHOPAEDIC PROGRESS NOTE  s/p Procedure(s): REVERSE SHOULDER ARTHROPLASTY  SUBJECTIVE: Patient resting comfortably in hospital bed. He states he feels sore in his right upper arm. He still having some pain in his elbow/forearm. Swelling has improved. Confused this morning, but very pleasant. He was trying to remove his Prevena last night.   OBJECTIVE: PE: General: resting comfortably in hospital bed, NAD RUE: Prevena wound vac in place with good seal - reinforced. Approximately 60 cc of bloody drainage in canister.  Sling well fitting. He endorses axillary nerve sensation. Unable to appreciate deltoid motor function this was noted prior to surgery. Continues to have erythema of the right upper arm which is stable in appearance. (See images) Swelling of left forearm and hand have improved.  + motor function AIN, PIN, ulnar distribution. Actively flexes and extends his wrist. He endorses sensation intact in medial, radial, and ulnar distributions. Well perfused digits.         Vitals:   10/28/21 1700 10/28/21 2156  BP: 126/61 113/63  Pulse: 96 87  Resp:  17  Temp:  98 F (36.7 C)  SpO2:  95%   Cultures from right shoulder - continue to show no growth, results pending  ASSESSMENT: JILL RUPPE is a 71 y.o. male POD#2  PLAN: Weightbearing: NWB RUE  -  NO ROM of right shoulder for 4 weeks.  - Okay for wrist/hand exercises.  Insicional and dressing care: Dressings left intact until follow-up Continue Prevena wound VAC for 1 week Orthopedic device(s): Wound BWI:OMBTDHR, Sling Showering: Hold for now ABLA: Hgb 8.8 today, continue to monitor VTE prophylaxis: per primary team, no orthopedic contraindications Pain control: PRN pain medications, minimize narcotics as able. Will discontinue IV narcotics.  Follow - up plan: 1 week in office Dispo: TBD. OT recommending SNF. Patient okay for discharge from orthopedics standpoint once cleared by medicine team and  therapies.  Continue IV antibiotics with transition to amoxicillin at discharge as recommended by primary team. He is extremely high risk of infection, dislocation, loss of fixation and wound dehiscence.     Contact information:  Weekdays 8-5 Dr. Ophelia Charter, Noemi Chapel PA-C, After hours and holidays please check [email or web address] for group call information for [facility]   Noemi Chapel, PA-C 10/29/2021

## 2021-10-29 NOTE — TOC Progression Note (Signed)
Transition of Care [facility]) - Progression Note    Patient Details  Name: Edward Singleton MRN: [id number] Date of Birth: [birth year]  Transition of Care [facility]) CM/SW Contact  Joanne Chars, LCSW Phone Number: 10/29/2021, 3:55 PM  Clinical Narrative:   CSW met with pt regarding SNF recommendation.  Pt states that he does not think he will need SNF, believes he will be able to go home after a few days.  Pt appears somewhat confused upon more extended conversation, per flowsheets has not been fully oriented.  Permission given to speak with sister [name].  TOC will continue to follow.     Expected Discharge Plan: [place] (vs home with home health services) Barriers to Discharge: Continued Medical Work up  Expected Discharge Plan and Services Expected Discharge Plan: [place] (vs home with home health services)   Discharge Planning Services: CM Consult   Living arrangements for the past 2 months: [place]: [place] Date [place]: 10/27/21 Time [place]: 1109 Representative spoke with at [place]: [place] ([place]) Interventions    Readmission Risk Interventions Readmission Risk Prevention Plan 03/15/2019  Transportation Screening Complete  PCP or Specialist Appt within 3-5 Days Complete  HRI or [place] Complete  Social Work Consult for [place] Planning/Counseling Complete  Palliative Care Screening Complete  Medication Review Press photographer) Complete  Some recent data might be hidden

## 2021-10-30 ENCOUNTER — Encounter (HOSPITAL_COMMUNITY): Payer: Self-pay | Admitting: Orthopaedic Surgery

## 2021-10-30 LAB — CBC
HCT: 25.5 % — ABNORMAL LOW (ref 39.0–52.0)
Hemoglobin: 8.6 g/dL — ABNORMAL LOW (ref 13.0–17.0)
MCH: 34.4 pg — ABNORMAL HIGH (ref 26.0–34.0)
MCHC: 33.7 g/dL (ref 30.0–36.0)
MCV: 102 fL — ABNORMAL HIGH (ref 80.0–100.0)
Platelets: 651 10*3/uL — ABNORMAL HIGH (ref 150–400)
RBC: 2.5 MIL/uL — ABNORMAL LOW (ref 4.22–5.81)
RDW: 18.4 % — ABNORMAL HIGH (ref 11.5–15.5)
WBC: 14.4 10*3/uL — ABNORMAL HIGH (ref 4.0–10.5)
nRBC: 0.1 % (ref 0.0–0.2)

## 2021-10-30 LAB — GLUCOSE, CAPILLARY
Glucose-Capillary: 101 mg/dL — ABNORMAL HIGH (ref 70–99)
Glucose-Capillary: 108 mg/dL — ABNORMAL HIGH (ref 70–99)
Glucose-Capillary: 112 mg/dL — ABNORMAL HIGH (ref 70–99)
Glucose-Capillary: 140 mg/dL — ABNORMAL HIGH (ref 70–99)
Glucose-Capillary: 86 mg/dL (ref 70–99)
Glucose-Capillary: 88 mg/dL (ref 70–99)

## 2021-10-30 LAB — POTASSIUM: Potassium: 3.2 mmol/L — ABNORMAL LOW (ref 3.5–5.1)

## 2021-10-30 MED ORDER — POTASSIUM CHLORIDE CRYS ER 20 MEQ PO TBCR
40.0000 meq | EXTENDED_RELEASE_TABLET | Freq: Once | ORAL | Status: AC
  Administered 2021-10-30: 40 meq via ORAL
  Filled 2021-10-30: qty 2

## 2021-10-30 MED ORDER — POTASSIUM CHLORIDE CRYS ER 20 MEQ PO TBCR
40.0000 meq | EXTENDED_RELEASE_TABLET | Freq: Two times a day (BID) | ORAL | Status: DC
Start: 2021-10-30 — End: 2021-10-30

## 2021-10-30 NOTE — Progress Notes (Signed)
Occupational Therapy Treatment Patient Details Name: Edward Singleton MRN: [id number] DOB: 1951/03/10 Today's Date: 10/30/2021   History of present illness Pt is a 71 y/o M admitted on 10/27/21 after being found down in his house. Pt reports he had been drinking too much & fell. Pt was confused, hypothermic & hypotensive. Pt is being treated for septic 2/2 strep bacteremia & hypovolemic shoc, rhabdomylosis . Pt found to have closed comminuted R proximal humerus fx.  He was transferred to [facility] from [facility] on  11/17 pm and underwent reverse TSA on 11/18.  Wound VAC Placed on Rt UE.  PMH: chronic alcohol use, DM, alcoholic dementia, h/o pelvic fx, gout, HTN   OT comments  Pt. Seen for skilled OT  treatment.  Found without sling on.  States "when is this swelling gonna go away".  Educated on need for compliance with sling, positioning, and ROM for RUE.  Reports he "panics" when the sling moves around.  Able to complete digit/wrist/elbow rom.    pt. Then Allowed me to don sling for him without the waist portion.  Placed wash cloth at neck to also aide in comfort.  Pt. Verbalized understanding and agreed to wear the sling in conjunction with ice, elevation, and rom for RUE.  Current d/c recommendations remain appropriate.     Recommendations for follow up therapy are one component of a multi-disciplinary discharge planning process, led by the attending physician.  Recommendations may be updated based on patient status, additional functional criteria and insurance authorization.    Follow Up Recommendations  Skilled nursing-short term rehab (<3 hours/day)    Assistance Recommended at Discharge Frequent or constant Supervision/Assistance  Patient can return home with the following  A lot of help with bathing/dressing/bathroom;Assistance with cooking/housework;Assist for transportation;Direct supervision/assist for medications management;Direct supervision/assist for financial management   Equipment  Recommendations  None recommended by OT    Recommendations for Other Services      Precautions / Restrictions Precautions Precautions: Fall;Shoulder Type of Shoulder Precautions: ROM elbow, wrist, and hand.  Sling at all times, and no ROM of shoulder Shoulder Interventions: Shoulder sling/immobilizer;At all times Required Braces or Orthoses: Sling Restrictions RUE Weight Bearing: Non weight bearing       Mobility Bed Mobility                    Transfers                         Balance                                           ADL either performed or assessed with clinical judgement   ADL Overall ADL's : Needs assistance/impaired                 Upper Body Dressing : Total assistance;Bed level Upper Body Dressing Details (indicate cue type and reason): agreed to wear sling if i removed the waistband portion, extensive education on positioning for pain and edema management                   General ADL Comments: pt. found without sling on, states when it shifts he panics and does not like it.  agreed to let me don withouth waist band and application of a washcloth around neck portion for comfort.  tolerated well with elevation  in conjunction with rom.  he was wanting to know "what are we gonna do about all this swelling" i reviewed his compliance with positioning, sling, and rom all aide in that.  he agreed and states he will try moving forward:)    Extremity/Trunk Assessment              Vision       Perception     Praxis      Cognition Arousal/Alertness: Awake/alert Behavior During Therapy: WFL for tasks assessed/performed Overall Cognitive Status: Within Functional Limits for tasks assessed                                          Exercises General Exercises - Upper Extremity Elbow Flexion: PROM, Right, 10 reps, Supine Elbow Extension: PROM, Right, 10 reps, Supine Wrist Flexion: PROM,  AAROM, Right, 10 reps, Supine Wrist Extension: PROM, AAROM, Right, 10 reps, Supine Digit Composite Flexion: AROM, Right, 10 reps, Supine Composite Extension: PROM, AAROM, Right, 10 reps, Supine    Shoulder Instructions Shoulder Instructions Donning/doffing sling/immobilizer: Maximal assistance     General Comments  Pt. Thanked me at the end of session for explaining and assisting him with the sling in a way he could tolerate it.  Agreed he would be more compliant to get better and reduce pain and swelling    Pertinent Vitals/ Pain       Pain Assessment Pain Assessment: No/denies pain  Home Living                                          Prior Functioning/Environment              Frequency  Min 2X/week        Progress Toward Goals  OT Goals(current goals can now be found in the care plan section)  Progress towards OT goals: Progressing toward goals     Plan Discharge plan remains appropriate;Frequency remains appropriate    Co-evaluation                 AM-PAC OT "6 Clicks" Daily Activity     Outcome Measure   Help from another person eating meals?: A Lot Help from another person taking care of personal grooming?: A Lot Help from another person toileting, which includes using toliet, bedpan, or urinal?: Total Help from another person bathing (including washing, rinsing, drying)?: Total Help from another person to put on and taking off regular upper body clothing?: Total Help from another person to put on and taking off regular lower body clothing?: Total 6 Click Score: 8    End of Session    OT Visit Diagnosis: Cognitive communication deficit (R41.841) Pain - Right/Left: Right Pain - part of body: Shoulder   Activity Tolerance Patient tolerated treatment well   Patient Left in bed;with call bell/phone within reach   Nurse Communication Other (comment) (rn states ok work with pt. today)        Time: 3818-2993 OT Time  Calculation (min): 10 min  Charges: OT General Charges $OT Visit: 1 Visit OT Treatments $Therapeutic Exercise: 8-22 mins  Sonia Baller, COTA/L Acute Rehabilitation [phone]   Tanya Nones 10/30/2021, 11:59 AM

## 2021-10-30 NOTE — Progress Notes (Signed)
Was ordered to do an In and out cath, pt was informed and educated on the process and importance of it. Pt refused it. MD notified

## 2021-10-30 NOTE — Progress Notes (Addendum)
Patient pulled out his wound vac line. Reached out to on call MD. Will try and reattach.  Spoke w/on call PA Chaswell, and he stated to try and reattach, or cover open part of dressing.   Pt refused to let me reinforce dressing or cover open part. Pt given education about his decision.   Will monitor shoulder.

## 2021-10-30 NOTE — Progress Notes (Signed)
PROGRESS NOTE    Edward Singleton  OIZ:[id number] DOB: 10-27-1950 DOA: 10/26/2021 PCP: Windy Fast, MD    Brief Narrative:  Edward Singleton is a pleasant 71 y.o. male with medical history significant for alcohol abuse, chronic renal insufficiency, anemia, hypertension, and type 2 diabetes mellitus who has been transferred from [facility] for operative management of proximal right humerus fracture.  He had presented to [facility] ED on 10/18/2021 after he had not been seen for several days and was found down at home after police forced entry, had significant bruising on his right arm, was confused, hypotensive, and hypothermic.  Per report of his sister, [name], who is healthcare power of attorney, he had been binge drinking since the summer.   [facility] ED and Hospital Course: Upon arrival to the ED, patient was found to be hypotensive with systolic pressures in the 70s, hypothermic to 32.4 C.  He was found to be in rhabdomyolysis with AKI superimposed on CKD, had initial lactic acid greater than 8, elevated procalcitonin, proximal right humerus fracture, and prolonged QT interval.   He was fluid resuscitated, given warmed IV fluids initially, admitted to the ICU on 10/18/2021, was requiring Levophed, but was stabilized and off of Levophed by 10/20/2021.   He was seen by nephrology during the hospitalization, serum CK trended down, and his renal function returned to baseline.   He had a streptococcal bacteremia, was evaluated by ID, noted that this could possibly be related to a colon malignancy or contaminant, was treated with Rocephin, has since been transitioned to cefazolin, and it was recommended that he continue antibiotics through 11/03/2021 which could be changed to amoxicillin when he is discharged.   Orthopedic surgeon at [facility] discussed the case with Dr. Griffin Basil who agreed with transfer to [facility] with tentative plan for reverse arthroplasty on 10/27/2021.   1/18 plan to go to the OR today. 1/19 was  combative and pulling on everything.  Was given Ativan.  Now he is calmer.  During my assessment he is awake and oriented and calm. 1/20 sleeping , does not wake up for me 1/21 per nsg pt urinating , post void scan has not been done.   Consultants:  Orthopedics  Procedures:   Antimicrobials:  Cefazolin   Subjective: Pt awake, responds to my question reports doing "ok". Overnight issues noted.  Objective: Vitals:   10/29/21 1113 10/29/21 2015 10/30/21 0500 10/30/21 0550  BP: 110/71 (!) 149/71  130/60  Pulse: 100 89  88  Resp: 19 17  14   Temp: 98.4 F (36.9 C) 99.1 F (37.3 C)  98.7 F (37.1 C)  TempSrc: Oral   Oral  SpO2: 93% 91%  93%  Weight:   72.5 kg   Height:        Intake/Output Summary (Last 24 hours) at 10/30/2021 0835 Last data filed at 10/30/2021 0500 Gross per 24 hour  Intake 120 ml  Output 1250 ml  Net -1130 ml   Filed Weights   10/27/21 2330 10/30/21 0500  Weight: 72.6 kg 72.5 kg    Examination: Sleeping, nad Cta no wheezing Reg s1/s2 no gallop Soft being +bs No edema Unable to assess mood or cns   Data Reviewed: I have personally reviewed following labs and imaging studies  CBC: Recent Labs  Lab 10/25/21 0422 10/26/21 0601 10/27/21 0223 10/28/21 0256 10/29/21 0237  WBC 16.6* 12.9* 15.5* 18.6* 22.5*  HGB 10.3* 10.4* 10.3* 8.4* 8.8*  HCT 30.6* 31.1* 30.5* 26.4* 26.4*  MCV 98.1  98.4 101.0* 102.7* 103.1*  PLT 427* 552* 596* 578* 092*   Basic Metabolic Panel: Recent Labs  Lab 10/25/21 0422 10/26/21 0601 10/27/21 0223 10/28/21 0256 10/29/21 0237  NA 130* 131* 132* 134* 135  K 3.6 3.3* 3.9 4.3 3.2*  CL 96* 95* 99 102 99  CO2 24 24 21* 22 24  GLUCOSE 117* 106* 100* 133* 100*  BUN 17 18 19 21 18   CREATININE 1.66* 1.69* 1.95* 1.94* 1.80*  CALCIUM 8.7* 8.5* 8.5* 8.0* 8.3*  MG  --   --  1.7  --   --   PHOS  --   --  2.9  --   --    GFR: Estimated Creatinine Clearance: 37.5 mL/min (A) (by C-G formula based on SCr of 1.8 mg/dL  (H)). Liver Function Tests: No results for input(s): AST, ALT, ALKPHOS, BILITOT, PROT, ALBUMIN in the last 168 hours. No results for input(s): LIPASE, AMYLASE in the last 168 hours. No results for input(s): AMMONIA in the last 168 hours. Coagulation Profile: No results for input(s): INR, PROTIME in the last 168 hours. Cardiac Enzymes: No results for input(s): CKTOTAL, CKMB, CKMBINDEX, TROPONINI in the last 168 hours.  BNP (last 3 results) No results for input(s): PROBNP in the last 8760 hours. HbA1C: No results for input(s): HGBA1C in the last 72 hours. CBG: Recent Labs  Lab 10/29/21 1558 10/29/21 2019 10/29/21 2358 10/30/21 0339 10/30/21 0453  GLUCAP 101* 118* 115* 86 101*   Lipid Profile: No results for input(s): CHOL, HDL, LDLCALC, TRIG, CHOLHDL, LDLDIRECT in the last 72 hours. Thyroid Function Tests: No results for input(s): TSH, T4TOTAL, FREET4, T3FREE, THYROIDAB in the last 72 hours. Anemia Panel: No results for input(s): VITAMINB12, FOLATE, FERRITIN, TIBC, IRON, RETICCTPCT in the last 72 hours. Sepsis Labs: No results for input(s): PROCALCITON, LATICACIDVEN in the last 168 hours.  Recent Results (from the past 240 hour(s))  CULTURE, BLOOD (ROUTINE X 2) w Reflex to ID Panel     Status: None   Collection Time: 10/20/21  6:08 PM   Specimen: BLOOD  Result Value Ref Range Status   Specimen Description BLOOD LFOA  Final   Special Requests BOTTLES DRAWN AEROBIC AND ANAEROBIC BCAV  Final   Culture   Final    NO GROWTH 5 DAYS Performed at [facility], [address] [place], [place] [address]    Report Status 10/25/2021 FINAL  Final  CULTURE, BLOOD (ROUTINE X 2) w Reflex to ID Panel     Status: None   Collection Time: 10/20/21  6:23 PM   Specimen: BLOOD  Result Value Ref Range Status   Specimen Description BLOOD [facility]  Final   Special Requests BOTTLES DRAWN AEROBIC ONLY BCLV  Final   Culture   Final    NO GROWTH 5 DAYS Performed at [facility],  [address], [place], [place] [address]    Report Status 10/25/2021 FINAL  Final  MRSA Next Gen by PCR, Nasal     Status: None   Collection Time: 10/27/21  6:39 AM   Specimen: Nasal Mucosa; Nasal Swab  Result Value Ref Range Status   MRSA by PCR Next Gen NOT DETECTED NOT DETECTED Final    Comment: (NOTE) The GeneXpert MRSA Assay (FDA approved for NASAL specimens only), is one component of a comprehensive MRSA colonization surveillance program. It is not intended to diagnose MRSA infection nor to guide or monitor treatment for MRSA infections. Test performance is not FDA approved in patients less  than 62 years old. Performed at [facility], [place] [address] [place], [place] [address]   Aerobic/Anaerobic Culture w Gram Stain (surgical/deep wound)     Status: None (Preliminary result)   Collection Time: 10/27/21  2:53 PM   Specimen: PATH Other; Tissue  Result Value Ref Range Status   Specimen Description TISSUE  Final   Special Requests SHOULDER 1 SPEC A  Final   Gram Stain   Final    FEW WBC PRESENT, PREDOMINANTLY MONONUCLEAR NO ORGANISMS SEEN    Culture   Final    NO GROWTH 2 DAYS Performed at [facility], [place] [address] [place], [place] [address]    Report Status PENDING  Incomplete  Aerobic/Anaerobic Culture w Gram Stain (surgical/deep wound)     Status: None (Preliminary result)   Collection Time: 10/27/21  2:54 PM   Specimen: PATH Other; Tissue  Result Value Ref Range Status   Specimen Description TISSUE  Final   Special Requests SHOULDER 2 SPEC B  Final   Gram Stain   Final    FEW WBC PRESENT, PREDOMINANTLY MONONUCLEAR NO ORGANISMS SEEN    Culture   Final    NO GROWTH 2 DAYS Performed at [facility], [place] [address] [place], [place] [address]    Report Status PENDING  Incomplete         Radiology Studies: No results found.      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 Proximal right humerus fracture  - Presents in transfer from [facility] for orthopedic surgery consult, likely reverse arthoplasty  1/19 status post reverse shoulder arthroplasty POD #1 Management per Ortho team Hold for shower for now Wound VAC in place Continue with sling Dressing left intact until follow-up, continue VAC for 1 week Follow-up in office in 1 week with orthopedics 1/21 PT/OT rec SNF     2. Strep bacteremia   - Seen by ID at [facility], source unclear, subsequent cultures negative, Per ID-This bacteria can be associated with Colon malignancy and has the potential to cause endocarditis, but low bioburden and with staph epidermidis in the same culture contamination can be questioned  repeated  imaging of rt shoulder and arm and there was no abscess or collection- 1/20 patient was on ceftriaxone switched to cefazolin.  Will need antibiotics until 11/03/2021.   1/21 continue iv while in hospital and dc on amoxicillin on discharge to complete dose       3. Leukocytosis Wbc continues to go up. Continue IV antibiotics as above 1/21 wbc coming down.  Foley was dc'd1/20  afebrile Bcx tdn      4. CAD; PAD  Asymptomatic currently without anginal complaints 1/20 continue statin 1/21  resume asa       5. CKD IIIb  Close to baseline  Avoid nephrotoxin      6. Urinary retention  - Foley placed at [facility] and present on arrival here  1/20- will dc foley. Will do voiding trial today.  Will bladder scan 24 hrs , in and out cath if scan >220ml.     7. Hypokalemia; hyponatremia  Replacement K Na stable    7. Anemia  - He was transfused on 1/12  - Hgb stable in 10-range last few days with no apparent bleeding     8. Alcohol abuse   - Unlikely to experience withdrawal at this point, continue b-vitamins and encourage alcohol avoidance going forward      DVT prophylaxis: SCD Code Status: Full Family Communication: None at bedside Disposition Plan: TBD Status is: Inpatient  Remains inpatient appropriate because: IV treatment.  SNF pending. Wbc keeps going up            LOS: 4 days   Time spent: 35 minutes with more than 50% on [place], MD [facility] Pager [phone]  If 7PM-7AM, please contact night-coverage 10/30/2021, 8:35 AM

## 2021-10-30 NOTE — Progress Notes (Signed)
Pt able to use urinal x2 at 0230 AM

## 2021-10-31 DIAGNOSIS — K5900 Constipation, unspecified: Secondary | ICD-10-CM

## 2021-10-31 LAB — GLUCOSE, CAPILLARY
Glucose-Capillary: 103 mg/dL — ABNORMAL HIGH (ref 70–99)
Glucose-Capillary: 103 mg/dL — ABNORMAL HIGH (ref 70–99)
Glucose-Capillary: 122 mg/dL — ABNORMAL HIGH (ref 70–99)
Glucose-Capillary: 128 mg/dL — ABNORMAL HIGH (ref 70–99)
Glucose-Capillary: 86 mg/dL (ref 70–99)
Glucose-Capillary: 89 mg/dL (ref 70–99)

## 2021-10-31 MED ORDER — POLYETHYLENE GLYCOL 3350 17 G PO PACK
17.0000 g | PACK | Freq: Two times a day (BID) | ORAL | Status: DC
  Administered 2021-10-31: 17 g via ORAL
  Filled 2021-10-31 (×3): qty 1

## 2021-10-31 MED ORDER — SENNOSIDES-DOCUSATE SODIUM 8.6-50 MG PO TABS
2.0000 | ORAL_TABLET | Freq: Two times a day (BID) | ORAL | Status: DC
  Administered 2021-10-31 (×2): 2 via ORAL
  Filled 2021-10-31 (×4): qty 2

## 2021-10-31 MED ORDER — ASPIRIN EC 81 MG PO TBEC
81.0000 mg | DELAYED_RELEASE_TABLET | Freq: Every day | ORAL | Status: DC
  Administered 2021-10-31 – 2021-11-02 (×3): 81 mg via ORAL
  Filled 2021-10-31 (×3): qty 1

## 2021-10-31 NOTE — Progress Notes (Signed)
PROGRESS NOTE    Edward Singleton  PIR:[id number] DOB: [birth year] DOA: 10/26/2021 PCP: Windy Fast, MD    Brief Narrative:  Edward Singleton is a pleasant 71 y.o. male with medical history significant for alcohol abuse, chronic renal insufficiency, anemia, hypertension, and type 2 diabetes mellitus who has been transferred from [facility] for operative management of proximal right humerus fracture.  He had presented to [facility] ED on 10/18/2021 after he had not been seen for several days and was found down at home after police forced entry, had significant bruising on his right arm, was confused, hypotensive, and hypothermic.  Per report of his sister, [name], who is healthcare power of attorney, he had been binge drinking since the summer.   [facility] ED and Hospital Course: Upon arrival to the ED, patient was found to be hypotensive with systolic pressures in the 70s, hypothermic to 32.4 C.  He was found to be in rhabdomyolysis with AKI superimposed on CKD, had initial lactic acid greater than 8, elevated procalcitonin, proximal right humerus fracture, and prolonged QT interval.   He was fluid resuscitated, given warmed IV fluids initially, admitted to the ICU on 10/18/2021, was requiring Levophed, but was stabilized and off of Levophed by 10/20/2021.   He was seen by nephrology during the hospitalization, serum CK trended down, and his renal function returned to baseline.   He had a streptococcal bacteremia, was evaluated by ID, noted that this could possibly be related to a colon malignancy or contaminant, was treated with Rocephin, has since been transitioned to cefazolin, and it was recommended that he continue antibiotics through 11/03/2021 which could be changed to amoxicillin when he is discharged.   Orthopedic surgeon at [facility] discussed the case with Dr. Griffin Basil who agreed with transfer to [facility] with tentative plan for reverse arthroplasty on 10/27/2021.   1/18 plan to go to the OR today. 1/19 was  combative and pulling on everything.  Was given Ativan.  Now he is calmer.  During my assessment he is awake and oriented and calm. 1/20 sleeping , does not wake up for me 1/21 per nsg pt urinating , post void scan has not been done.  1/22 c/o constipation and with urinary tension since Foley was pulled and has been receiving in and out cath.  Consultants:  Orthopedics  Procedures:   Antimicrobials:  Cefazolin   Subjective: Pleasant and in good mood this a.m. for me.  No complaints other than above.  No chest pain no shortness of breath.  Objective: Vitals:   10/30/21 0856 10/30/21 1238 10/30/21 2155 10/31/21 0336  BP: (!) 120/54 132/69 (!) 146/76 (!) 146/99  Pulse: 95 84 84 87  Resp: 16 15 20 18   Temp: 97.7 F (36.5 C)  98.3 F (36.8 C) 97.6 F (36.4 C)  TempSrc: Oral  Oral Oral  SpO2: 90% 92% 99% 94%  Weight:      Height:        Intake/Output Summary (Last 24 hours) at 10/31/2021 6301 Last data filed at 10/31/2021 0500 Gross per 24 hour  Intake 255 ml  Output 700 ml  Net -445 ml   Filed Weights   10/27/21 2330 10/30/21 0500  Weight: 72.6 kg 72.5 kg    Examination: Pleasant, NAD CTA no wheezing Regular S1-S2 no gallops Soft benign positive bowel sounds No edema Right shoulder wound covered in place  Awake and oriented x3    Data Reviewed: I have personally reviewed following labs and imaging studies  CBC: Recent Labs  Lab 10/26/21 0601 10/27/21 0223 10/28/21 0256 10/29/21 0237 10/30/21 1235  WBC 12.9* 15.5* 18.6* 22.5* 14.4*  HGB 10.4* 10.3* 8.4* 8.8* 8.6*  HCT 31.1* 30.5* 26.4* 26.4* 25.5*  MCV 98.4 101.0* 102.7* 103.1* 102.0*  PLT 552* 596* 578* 660* 992*   Basic Metabolic Panel: Recent Labs  Lab 10/25/21 0422 10/26/21 0601 10/27/21 0223 10/28/21 0256 10/29/21 0237 10/30/21 1235  NA 130* 131* 132* 134* 135  --   K 3.6 3.3* 3.9 4.3 3.2* 3.2*  CL 96* 95* 99 102 99  --   CO2 24 24 21* 22 24  --   GLUCOSE 117* 106* 100* 133* 100*  --    BUN 17 18 19 21 18   --   CREATININE 1.66* 1.69* 1.95* 1.94* 1.80*  --   CALCIUM 8.7* 8.5* 8.5* 8.0* 8.3*  --   MG  --   --  1.7  --   --   --   PHOS  --   --  2.9  --   --   --    GFR: Estimated Creatinine Clearance: 37.5 mL/min (A) (by C-G formula based on SCr of 1.8 mg/dL (H)). Liver Function Tests: No results for input(s): AST, ALT, ALKPHOS, BILITOT, PROT, ALBUMIN in the last 168 hours. No results for input(s): LIPASE, AMYLASE in the last 168 hours. No results for input(s): AMMONIA in the last 168 hours. Coagulation Profile: No results for input(s): INR, PROTIME in the last 168 hours. Cardiac Enzymes: No results for input(s): CKTOTAL, CKMB, CKMBINDEX, TROPONINI in the last 168 hours.  BNP (last 3 results) No results for input(s): PROBNP in the last 8760 hours. HbA1C: No results for input(s): HGBA1C in the last 72 hours. CBG: Recent Labs  Lab 10/30/21 1610 10/30/21 2152 10/30/21 2343 10/31/21 0331 10/31/21 0748  GLUCAP 140* 88 108* 86 122*   Lipid Profile: No results for input(s): CHOL, HDL, LDLCALC, TRIG, CHOLHDL, LDLDIRECT in the last 72 hours. Thyroid Function Tests: No results for input(s): TSH, T4TOTAL, FREET4, T3FREE, THYROIDAB in the last 72 hours. Anemia Panel: No results for input(s): VITAMINB12, FOLATE, FERRITIN, TIBC, IRON, RETICCTPCT in the last 72 hours. Sepsis Labs: No results for input(s): PROCALCITON, LATICACIDVEN in the last 168 hours.  Recent Results (from the past 240 hour(s))  MRSA Next Gen by PCR, Nasal     Status: None   Collection Time: 10/27/21  6:39 AM   Specimen: Nasal Mucosa; Nasal Swab  Result Value Ref Range Status   MRSA by PCR Next Gen NOT DETECTED NOT DETECTED Final    Comment: (NOTE) The GeneXpert MRSA Assay (FDA approved for NASAL specimens only), is one component of a comprehensive MRSA colonization surveillance program. It is not intended to diagnose MRSA infection nor to guide or monitor treatment for MRSA infections. Test  performance is not FDA approved in patients less than 75 years old. Performed at [facility], [place] [address], [place], [place] [address]   Aerobic/Anaerobic Culture w Gram Stain (surgical/deep wound)     Status: None (Preliminary result)   Collection Time: 10/27/21  2:53 PM   Specimen: PATH Other; Tissue  Result Value Ref Range Status   Specimen Description TISSUE  Final   Special Requests SHOULDER 1 SPEC A  Final   Gram Stain   Final    FEW WBC PRESENT, PREDOMINANTLY MONONUCLEAR NO ORGANISMS SEEN    Culture   Final    NO GROWTH 3 DAYS Performed at [facility]  Lab, [address] [place], [place] [address]    Report Status PENDING  Incomplete  Aerobic/Anaerobic Culture w Gram Stain (surgical/deep wound)     Status: None (Preliminary result)   Collection Time: 10/27/21  2:54 PM   Specimen: PATH Other; Tissue  Result Value Ref Range Status   Specimen Description TISSUE  Final   Special Requests SHOULDER 2 SPEC B  Final   Gram Stain   Final    FEW WBC PRESENT, PREDOMINANTLY MONONUCLEAR NO ORGANISMS SEEN    Culture   Final    NO GROWTH 3 DAYS Performed at [facility], [place] [name], [place], [place] [address]    Report Status PENDING  Incomplete         Radiology Studies: No results found.      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 Proximal right humerus fracture  - Presents in transfer from [facility] for orthopedic surgery consult, likely reverse arthoplasty  1/19 status post reverse shoulder arthroplasty POD #1 Management per Ortho team Hold for shower for now Wound VAC in place Continue with sling Dressing left intact until follow-up, continue VAC for 1 week Follow-up in office in 1 week with orthopedics 1/22 sling in place  PT OT recommend SNF.         2. Strep bacteremia   - Seen by ID at [facility], source unclear, subsequent cultures negative, Per ID-This bacteria can be associated with Colon malignancy and has the potential to cause endocarditis, but low bioburden and with staph epidermidis in the same culture contamination can be questioned  repeated  imaging of rt shoulder and arm and there was no abscess or collection- 1/20 patient was on ceftriaxone switched to cefazolin.  Will need antibiotics until 11/03/2021.   1/22 continue IV while in hospital and DC amoxicillin on discharge to complete course (11/03/2021)      3. Leukocytosis Wbc continues to go up. Continue IV antibiotics as above Leukocytosis was improving Afebrile      4. CAD; PAD  Asymptomatic currently without anginal complaints 1/22 continue statin and aspirin       5. CKD IIIb  Close to baseline  Avoid nephrotoxin      6. Urinary retention  - Foley placed at [facility] and present on arrival here  1/22 Foley was discontinued on the 20th.  He is having urinary retention requiring in and out cath  Will continue with In-N-Out cath today if he continues to retain we will place Foley and  Need to have more aggressive bowel regimen to help with urinary retention  Ck renal function in am   7.consitpation Start aggressive bowel regimen    8. Hypokalemia; hyponatremia  Replacement K Na  stable    9. Anemia  - He was transfused on 1/12  - Hgb stable in 10-range last few days with no apparent bleeding     10. Alcohol abuse   - Unlikely to experience withdrawal at this point, continue b-vitamins and encourage alcohol avoidance going forward      DVT prophylaxis: SCD Code Status: Full Family Communication: None at bedside Disposition Plan: TBD Status is: Inpatient  Remains inpatient appropriate because: IV treatment.  SNF pending. Wbc keeps going up            LOS: 5 days   Time spent: 45 minutes with more than 50% on [place], MD [facility] Pager [phone]  If 7PM-7AM, please contact night-coverage 10/31/2021, 8:11 AM

## 2021-10-31 NOTE — Progress Notes (Signed)
Occupational Therapy Treatment Patient Details Name: Edward Singleton MRN: [id number] DOB: 02-28-1951 Today's Date: 10/31/2021   History of present illness Pt is a 71 y/o M admitted on 10/27/21 after being found down in his house. Pt reports he had been drinking too much & fell. Pt was confused, hypothermic & hypotensive. Pt is being treated for septic 2/2 strep bacteremia & hypovolemic shoc, rhabdomylosis . Pt found to have closed comminuted R proximal humerus fx.  He was transferred to [facility] from [facility] on  11/17 pm and underwent reverse TSA on 11/18.  Wound VAC Placed on Rt UE.  PMH: chronic alcohol use, DM, alcoholic dementia, h/o pelvic fx, gout, HTN   OT comments  Patient received in supine with sling partially on. Patient instructed in AAROM and AROM exercise to RUE elbow and hand to address HEP.  Patient tolerated therapy well with mild to mod complaints of RUE shoulder pain.  Patient's sling readjusted to better and ice pack applied. Acute OT to continue to follow.    Recommendations for follow up therapy are one component of a multi-disciplinary discharge planning process, led by the attending physician.  Recommendations may be updated based on patient status, additional functional criteria and insurance authorization.    Follow Up Recommendations  Skilled nursing-short term rehab (<3 hours/day)    Assistance Recommended at Discharge Frequent or constant Supervision/Assistance  Patient can return home with the following  A lot of help with bathing/dressing/bathroom;Assistance with cooking/housework;Assist for transportation;Direct supervision/assist for medications management;Direct supervision/assist for financial management   Equipment Recommendations  None recommended by OT    Recommendations for Other Services      Precautions / Restrictions Precautions Precautions: Fall;Shoulder Type of Shoulder Precautions: ROM elbow, wrist, and hand.  Sling at all times, and no ROM of  shoulder Shoulder Interventions: Shoulder sling/immobilizer;At all times Precaution Booklet Issued: No Precaution Comments: unable to recall shoulder precautions Required Braces or Orthoses: Sling Restrictions Other Position/Activity Restrictions: sling on at all times       Mobility Bed Mobility                    Transfers                         Balance                                           ADL either performed or assessed with clinical judgement   ADL                                              Extremity/Trunk Assessment Upper Extremity Assessment RUE Deficits / Details: No RUE shoulder ROM RUE Coordination: decreased fine motor;decreased gross motor LUE Deficits / Details: generally WFL ROM, but MMT grossly 4-/5            Vision       Perception     Praxis      Cognition Arousal/Alertness: Awake/alert Behavior During Therapy: WFL for tasks assessed/performed Overall Cognitive Status: Within Functional Limits for tasks assessed Area of Impairment: Orientation, Attention, Memory, Following commands, Safety/judgement, Awareness, Problem solving                 Orientation  Level: Disoriented to, Situation Current Attention Level: Sustained Memory: Decreased short-term memory, Decreased recall of precautions Following Commands: Follows one step commands consistently Safety/Judgement: Decreased awareness of deficits, Decreased awareness of safety   Problem Solving: Slow processing, Decreased initiation, Difficulty sequencing, Requires verbal cues, Requires tactile cues General Comments: able to recall month and year but not day of week.  Knew he was in hospital but could not name it        Exercises Exercises: General Upper Extremity General Exercises - Upper Extremity Elbow Flexion: AAROM, Right, 10 reps, Supine Elbow Extension: AAROM, Right, 10 reps, Supine Wrist Flexion: AAROM, AROM,  Right, 10 reps, Supine Wrist Extension: AROM, AAROM, Right, 10 reps, Supine Digit Composite Flexion: AROM, Right, 10 reps, Supine Composite Extension: PROM, AAROM, Right, 10 reps, Supine Other Exercises Other Exercises: Sling readjusted    Shoulder Instructions Shoulder Instructions Donning/doffing sling/immobilizer: Maximal assistance     General Comments      Pertinent Vitals/ Pain       Pain Assessment Pain Assessment: Faces Faces Pain Scale: Hurts little more Pain Location: RUE Pain Descriptors / Indicators: Discomfort, Grimacing Pain Intervention(s): Monitored during session, Premedicated before session, Repositioned  Home Living                                          Prior Functioning/Environment              Frequency  Min 2X/week        Progress Toward Goals  OT Goals(current goals can now be found in the care plan section)  Progress towards OT goals: Progressing toward goals  Acute Rehab OT Goals Patient Stated Goal: get better OT Goal Formulation: With patient Time For Goal Achievement: 11/11/21 Potential to Achieve Goals: Good ADL Goals Pt Will Perform Eating: with set-up;with supervision;sitting Pt Will Perform Grooming: with set-up;with supervision;sitting Pt Will Perform Upper Body Bathing: with mod assist;sitting Pt Will Perform Lower Body Bathing: with mod assist;sit to/from stand Pt Will Perform Upper Body Dressing: with mod assist;sitting Pt Will Perform Lower Body Dressing: with mod assist;sit to/from stand Pt Will Transfer to Toilet: with mod assist;stand pivot transfer;bedside commode Pt Will Perform Toileting - Clothing Manipulation and hygiene: with max assist;sit to/from stand Pt/caregiver will Perform Home Exercise Program: Right Upper extremity;With Supervision;With written HEP provided Additional ADL Goal #1: Pt will require min cues to adhere to shoulder precautions  Plan Discharge plan remains  appropriate;Frequency remains appropriate    Co-evaluation                 AM-PAC OT "6 Clicks" Daily Activity     Outcome Measure   Help from another person eating meals?: A Lot Help from another person taking care of personal grooming?: A Lot Help from another person toileting, which includes using toliet, bedpan, or urinal?: Total Help from another person bathing (including washing, rinsing, drying)?: Total Help from another person to put on and taking off regular upper body clothing?: Total Help from another person to put on and taking off regular lower body clothing?: Total 6 Click Score: 8    End of Session Equipment Utilized During Treatment: Other (comment) (sling for RUE shoulder)  OT Visit Diagnosis: Cognitive communication deficit (R41.841) Pain - Right/Left: Right Pain - part of body: Shoulder   Activity Tolerance Patient tolerated treatment well   Patient Left in bed;with call bell/phone within reach;with  bed alarm set   Nurse Communication Other (comment) (informed nursing on sling repositioned)        Time: 9539-6728 OT Time Calculation (min): 18 min  Charges: OT General Charges $OT Visit: 1 Visit OT Treatments $Therapeutic Exercise: 8-22 mins  Lodema Hong, [place]  Pager [phone] Office [place] 10/31/2021, 2:58 PM

## 2021-10-31 NOTE — Progress Notes (Addendum)
Called regarding incisional wound vac. Apparently tubing had been pulled off accidentally sometime yesterday. New incisional wound vac placed. Incision without purulent drainage. Patient tolerated change well. New canister placed too. Seal intact with good suction. Please call ortho provider if any further wound vac issues. Patient can be transitioned from in-house wound vac canister to [place] at discharge.   Merlene Pulling, PA-C

## 2021-10-31 NOTE — Progress Notes (Signed)
Bladder scan revealed 585- pt allowed in/out cath and pulled out 600cc straw colored urine-

## 2021-10-31 NOTE — Progress Notes (Signed)
Reeducated on proper sling placement and usage- as well as elevating extremity on a pillow to decrease swelling and pain.

## 2021-11-01 LAB — GLUCOSE, CAPILLARY
Glucose-Capillary: 90 mg/dL (ref 70–99)
Glucose-Capillary: 107 mg/dL — ABNORMAL HIGH (ref 70–99)
Glucose-Capillary: 91 mg/dL (ref 70–99)
Glucose-Capillary: 139 mg/dL — ABNORMAL HIGH (ref 70–99)
Glucose-Capillary: 90 mg/dL (ref 70–99)

## 2021-11-01 LAB — AEROBIC/ANAEROBIC CULTURE W GRAM STAIN (SURGICAL/DEEP WOUND)
Culture: NO GROWTH
Culture: NO GROWTH

## 2021-11-01 LAB — BASIC METABOLIC PANEL
Anion gap: 9 (ref 5–15)
BUN: 13 mg/dL (ref 8–23)
CO2: 21 mmol/L — ABNORMAL LOW (ref 22–32)
Calcium: 7.7 mg/dL — ABNORMAL LOW (ref 8.9–10.3)
Chloride: 102 mmol/L (ref 98–111)
Creatinine, Ser: 1.68 mg/dL — ABNORMAL HIGH (ref 0.61–1.24)
GFR, Estimated: 44 mL/min — ABNORMAL LOW (ref >60)
Glucose, Bld: 98 mg/dL (ref 70–99)
Potassium: 3.2 mmol/L — ABNORMAL LOW (ref 3.5–5.1)
Sodium: 132 mmol/L — ABNORMAL LOW (ref 135–145)

## 2021-11-01 MED ORDER — POTASSIUM CHLORIDE CRYS ER 20 MEQ PO TBCR
40.0000 meq | EXTENDED_RELEASE_TABLET | Freq: Two times a day (BID) | ORAL | Status: AC
  Administered 2021-11-01 (×2): 40 meq via ORAL
  Filled 2021-11-01 (×2): qty 2

## 2021-11-01 NOTE — Progress Notes (Signed)
Physical Therapy Treatment Patient Details Name: Edward Singleton MRN: [id number] DOB: [birth year] Today's Date: 11/01/2021   History of Present Illness Pt is a 71 y/o M admitted on 10/27/21 after being found down in his house. Pt reports he had been drinking too much & fell. Pt was confused, hypothermic & hypotensive. Pt is being treated for septic 2/2 strep bacteremia & hypovolemic shoc, rhabdomylosis . Pt found to have closed comminuted R proximal humerus fx.  He was transferred to [facility] from [facility] on  11/17 pm and underwent reverse TSA on 11/18.  Wound VAC Placed on Rt UE.  PMH: chronic alcohol use, DM, alcoholic dementia, h/o pelvic fx, gout, HTN    PT Comments    Patient progressing well towards PT goals. Pt pleasant and more aware of injury today, sling donned upon arrival. Requires Mod assist for bed mobility due to NWB RUE. Session focused on gait training today using IV pole for part of way due to imbalance progressing to no UE support. Noted to have 2-3/4 DOE. VSS on RA today. Needs reminders he cannot get up alone. Will follow.     Recommendations for follow up therapy are one component of a multi-disciplinary discharge planning process, led by the attending physician.  Recommendations may be updated based on patient status, additional functional criteria and insurance authorization.  Follow Up Recommendations  Skilled nursing-short term rehab (<3 hours/day)     Assistance Recommended at Discharge Intermittent Supervision/Assistance  Patient can return home with the following A little help with walking and/or transfers;A little help with bathing/dressing/bathroom;Direct supervision/assist for financial management;Help with stairs or ramp for entrance;Assist for transportation;Direct supervision/assist for medications management;Assistance with cooking/housework   Equipment Recommendations  None recommended by PT    Recommendations for Other Services       Precautions / Restrictions  Precautions Precautions: Fall;Shoulder Type of Shoulder Precautions: ROM elbow, wrist, and hand.  Sling at all times, and no ROM of shoulder Shoulder Interventions: Shoulder sling/immobilizer;At all times Required Braces or Orthoses: Sling Restrictions Weight Bearing Restrictions: Yes RUE Weight Bearing: Non weight bearing Other Position/Activity Restrictions: sling on at all times     Mobility  Bed Mobility Overal bed mobility: Needs Assistance Bed Mobility: Sidelying to Sit, Sit to Supine   Sidelying to sit: Mod assist, HOB elevated   Sit to supine: Min guard, HOB elevated   General bed mobility comments: Assist with trunk and scooting bottom to get to EOB    Transfers Overall transfer level: Needs assistance Equipment used: 1 person hand held assist Transfers: Sit to/from Stand Sit to Stand: Min assist           General transfer comment: ASsist to power to standing and for balance; stood from EOB x1, from toilet x1.    Ambulation/Gait Ambulation/Gait assistance: Min guard Gait Distance (Feet): 175 Feet Assistive device: IV Pole Gait Pattern/deviations: Step-through pattern, Decreased stride length, Wide base of support Gait velocity: decreased     General Gait Details: Slow, guarded and mildly unsteady gait with wide BoS, IV pole for support. No tremors today. 2-3/4 DOE. VSS on RA.   Stairs             Wheelchair Mobility    Modified Rankin (Stroke Patients Only)       Balance Overall balance assessment: Needs assistance Sitting-balance support: Feet supported, No upper extremity supported Sitting balance-Leahy Scale: Fair     Standing balance support: During functional activity Standing balance-Leahy Scale: Fair Standing balance comment: Requires MIN  GUARD fot static stand balance and Min A-Min guard for walking                            Cognition Arousal/Alertness: Awake/alert Behavior During Therapy: WFL for tasks  assessed/performed Overall Cognitive Status: Impaired/Different from baseline Area of Impairment: Awareness, Memory, Safety/judgement                     Memory: Decreased recall of precautions   Safety/Judgement: Decreased awareness of deficits, Decreased awareness of safety Awareness: Emergent            Exercises      General Comments        Pertinent Vitals/Pain Pain Assessment Pain Assessment: Faces Faces Pain Scale: Hurts little more Pain Location: RUE with movement Pain Descriptors / Indicators: Discomfort, Grimacing Pain Intervention(s): Monitored during session, Repositioned    Home Living                          Prior Function            PT Goals (current goals can now be found in the care plan section) Progress towards PT goals: Progressing toward goals    Frequency    Min 3X/week      PT Plan Current plan remains appropriate    Co-evaluation              AM-PAC PT "6 Clicks" Mobility   Outcome Measure  Help needed turning from your back to your side while in a flat bed without using bedrails?: A Little Help needed moving from lying on your back to sitting on the side of a flat bed without using bedrails?: A Lot Help needed moving to and from a bed to a chair (including a wheelchair)?: A Little Help needed standing up from a chair using your arms (e.g., wheelchair or bedside chair)?: A Little Help needed to walk in hospital room?: A Little Help needed climbing 3-5 steps with a railing? : A Little 6 Click Score: 17    End of Session Equipment Utilized During Treatment: Gait belt Activity Tolerance: Patient tolerated treatment well Patient left: in bed;with call bell/phone within reach;with bed alarm set Nurse Communication: Mobility status PT Visit Diagnosis: Unsteadiness on feet (R26.81);Muscle weakness (generalized) (M62.81);Difficulty in walking, not elsewhere classified (R26.2);Pain Pain - Right/Left:  Right Pain - part of body: Shoulder     Time: 1414-1440 PT Time Calculation (min) (ACUTE ONLY): 26 min  Charges:  $Gait Training: 23-37 mins                     Marisa Severin, PT, DPT Acute Rehabilitation Services Pager [phone] Office [place] 11/01/2021, 3:28 PM

## 2021-11-01 NOTE — Progress Notes (Signed)
PROGRESS NOTE    Edward Singleton  WCH:[id number] DOB: April 19, 1951 DOA: 10/26/2021 PCP: Windy Fast, MD    Brief Narrative:  Edward Singleton is a pleasant 71 y.o. male with medical history significant for alcohol abuse, chronic renal insufficiency, anemia, hypertension, and type 2 diabetes mellitus who has been transferred from [facility] for operative management of proximal right humerus fracture.  He had presented to [facility] ED on 10/18/2021 after he had not been seen for several days and was found down at home after police forced entry, had significant bruising on his right arm, was confused, hypotensive, and hypothermic.  Per report of his sister, Edward Singleton, who is healthcare power of attorney, he had been binge drinking since the summer.   [facility] ED and Hospital Course: Upon arrival to the ED, patient was found to be hypotensive with systolic pressures in the 70s, hypothermic to 32.4 C.  He was found to be in rhabdomyolysis with AKI superimposed on CKD, had initial lactic acid greater than 8, elevated procalcitonin, proximal right humerus fracture, and prolonged QT interval.   He was fluid resuscitated, given warmed IV fluids initially, admitted to the ICU on 10/18/2021, was requiring Levophed, but was stabilized and off of Levophed by 10/20/2021.   He was seen by nephrology during the hospitalization, serum CK trended down, and his renal function returned to baseline.   He had a streptococcal bacteremia, was evaluated by ID, noted that this could possibly be related to a colon malignancy or contaminant, was treated with Rocephin, has since been transitioned to cefazolin, and it was recommended that he continue antibiotics through 11/03/2021 which could be changed to amoxicillin when he is discharged.   Orthopedic surgeon at [facility] discussed the case with Dr. Griffin Basil who agreed with transfer to [facility] with tentative plan for reverse arthroplasty on 10/27/2021.   1/18 plan to go to the OR today. 1/19 was  combative and pulling on everything.  Was given Ativan.  Now he is calmer.  During my assessment he is awake and oriented and calm. 1/20 sleeping , does not wake up for me 1/21 per nsg pt urinating , post void scan has not been done.  1/22 c/o constipation and with urinary tension since Foley was pulled and has been receiving in and out cath. 1/23 patient in good mood this AM.  Discussed rehab and he is agreeable.  Had to reinsert Foley due to urinary retention during our voiding trial.  Apparently overnight tubing of wound VAC was pulled off and Ortho's fix that.  Consultants:  Orthopedics  Procedures:   Antimicrobials:  Cefazolin   Subjective: Denies shortness of breath or chest pain.  Objective: Vitals:   10/31/21 1555 10/31/21 2106 11/01/21 0624 11/01/21 0811  BP: (!) 115/59 123/78 131/73 (!) 148/78  Pulse: 80 84 87 85  Resp: 18 16 16 18   Temp: 98.6 F (37 C) 98.2 F (36.8 C) 98.3 F (36.8 C) 98.4 F (36.9 C)  TempSrc: Oral     SpO2: 90% 95% 93% 92%  Weight:      Height:        Intake/Output Summary (Last 24 hours) at 11/01/2021 1028 Last data filed at 11/01/2021 0525 Gross per 24 hour  Intake --  Output 307 ml  Net -307 ml   Filed Weights   10/27/21 2330 10/30/21 0500  Weight: 72.6 kg 72.5 kg    Examination: Calm, pleasant, NAD CTA no wheezing Regular S1-S2 no gallops Soft benign positive bowel sounds No  edema lower extremity Right upper elbow wound VAC in place Awake oriented x3 grossly intact    Data Reviewed: I have personally reviewed following labs and imaging studies  CBC: Recent Labs  Lab 10/26/21 0601 10/27/21 0223 10/28/21 0256 10/29/21 0237 10/30/21 1235  WBC 12.9* 15.5* 18.6* 22.5* 14.4*  HGB 10.4* 10.3* 8.4* 8.8* 8.6*  HCT 31.1* 30.5* 26.4* 26.4* 25.5*  MCV 98.4 101.0* 102.7* 103.1* 102.0*  PLT 552* 596* 578* 660* 144*   Basic Metabolic Panel: Recent Labs  Lab 10/26/21 0601 10/27/21 0223 10/28/21 0256 10/29/21 0237  10/30/21 1235 11/01/21 0450  NA 131* 132* 134* 135  --  132*  K 3.3* 3.9 4.3 3.2* 3.2* 3.2*  CL 95* 99 102 99  --  102  CO2 24 21* 22 24  --  21*  GLUCOSE 106* 100* 133* 100*  --  98  BUN 18 19 21 18   --  13  CREATININE 1.69* 1.95* 1.94* 1.80*  --  1.68*  CALCIUM 8.5* 8.5* 8.0* 8.3*  --  7.7*  MG  --  1.7  --   --   --   --   PHOS  --  2.9  --   --   --   --    GFR: Estimated Creatinine Clearance: 40.1 mL/min (A) (by C-G formula based on SCr of 1.68 mg/dL (H)). Liver Function Tests: No results for input(s): AST, ALT, ALKPHOS, BILITOT, PROT, ALBUMIN in the last 168 hours. No results for input(s): LIPASE, AMYLASE in the last 168 hours. No results for input(s): AMMONIA in the last 168 hours. Coagulation Profile: No results for input(s): INR, PROTIME in the last 168 hours. Cardiac Enzymes: No results for input(s): CKTOTAL, CKMB, CKMBINDEX, TROPONINI in the last 168 hours.  BNP (last 3 results) No results for input(s): PROBNP in the last 8760 hours. HbA1C: No results for input(s): HGBA1C in the last 72 hours. CBG: Recent Labs  Lab 10/31/21 1621 10/31/21 1959 10/31/21 2335 11/01/21 0355 11/01/21 0810  GLUCAP 128* 89 103* 90 107*   Lipid Profile: No results for input(s): CHOL, HDL, LDLCALC, TRIG, CHOLHDL, LDLDIRECT in the last 72 hours. Thyroid Function Tests: No results for input(s): TSH, T4TOTAL, FREET4, T3FREE, THYROIDAB in the last 72 hours. Anemia Panel: No results for input(s): VITAMINB12, FOLATE, FERRITIN, TIBC, IRON, RETICCTPCT in the last 72 hours. Sepsis Labs: No results for input(s): PROCALCITON, LATICACIDVEN in the last 168 hours.  Recent Results (from the past 240 hour(s))  MRSA Next Gen by PCR, Nasal     Status: None   Collection Time: 10/27/21  6:39 AM   Specimen: Nasal Mucosa; Nasal Swab  Result Value Ref Range Status   MRSA by PCR Next Gen NOT DETECTED NOT DETECTED Final    Comment: (NOTE) The GeneXpert MRSA Assay (FDA approved for NASAL specimens  only), is one component of a comprehensive MRSA colonization surveillance program. It is not intended to diagnose MRSA infection nor to guide or monitor treatment for MRSA infections. Test performance is not FDA approved in patients less than 52 years old. Performed at [facility], [place] [address], [place], [place] [address]   Aerobic/Anaerobic Culture w Gram Stain (surgical/deep wound)     Status: None (Preliminary result)   Collection Time: 10/27/21  2:53 PM   Specimen: PATH Other; Tissue  Result Value Ref Range Status   Specimen Description TISSUE  Final   Special Requests SHOULDER 1 SPEC A  Final   Gram Stain   Final  FEW WBC PRESENT, PREDOMINANTLY MONONUCLEAR NO ORGANISMS SEEN    Culture   Final    NO GROWTH 4 DAYS Performed at [facility], [place] [address], [place], [place] [address]    Report Status PENDING  Incomplete  Aerobic/Anaerobic Culture w Gram Stain (surgical/deep wound)     Status: None (Preliminary result)   Collection Time: 10/27/21  2:54 PM   Specimen: PATH Other; Tissue  Result Value Ref Range Status   Specimen Description TISSUE  Final   Special Requests SHOULDER 2 SPEC B  Final   Gram Stain   Final    FEW WBC PRESENT, PREDOMINANTLY MONONUCLEAR NO ORGANISMS SEEN    Culture   Final    NO GROWTH 4 DAYS Performed at [facility], [place] [address], [place], [place] [address]    Report Status PENDING  Incomplete         Radiology Studies: No results found.      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 Proximal right humerus fracture  - Presents in transfer from [facility] for orthopedic surgery consult, likely reverse arthoplasty  status post reverse shoulder arthroplasty 1/18 Management per Ortho team Hold for shower for now Dressing left intact until follow-up, continue VAC for 1 week Follow-up in office in 1 week with orthopedics 1/23 patient can be transition from in-house wound VAC canister to prevent at discharge  Minimize narcotics if able  Discontinue IV narcotics  continue with sling in place  PT OT recommend SNF        2. Strep bacteremia   - Seen by ID at [facility], source unclear, subsequent cultures negative, Per ID-This bacteria can be associated with Colon malignancy and has the potential to cause endocarditis, but low bioburden and with staph epidermidis in the same culture contamination can be questioned  repeated  imaging of rt shoulder and arm and there was no abscess or collection- 1/20 patient was on ceftriaxone switched to cefazolin.  Will need antibiotics until 11/03/2021.   1/23 continue IV while in hospital and DC to amoxicillin on discharge to complete course (11/03/2021)      3. Leukocytosis Wbc continues to go up. Continue IV antibiotics as above 1/23 leukocytosis down improving  Afebrile        4. CAD; PAD  Asymptomatic currently without anginal complaints /23 continue statin and aspirin          5. CKD IIIb  At baseline Avoid nephrotoxin  6. Urinary retention  - Foley placed at [facility] and present on arrival here  1/22 Foley was  discontinued on the 20th.  He is having urinary retention requiring in and out cath  Will continue with In-N-Out cath today if he continues to retain we will place Foley and  Need to have more aggressive bowel regimen to help with urinary retention  1/23 had to replace Foley due to failure voiding trial   7.consitpation continue aggressive bowel regimen    8. Hypokalemia; hyponatremia  Will replace potassium Monitor levels   9. Anemia  - He was transfused on 1/12  - Hgb stable in 10-range last few days with no apparent bleeding     10. Alcohol abuse   - Unlikely to experience withdrawal at this point, continue b-vitamins and encourage alcohol avoidance going forward      DVT prophylaxis: SCD Code Status: Full Family Communication: Updated Sister Disposition Plan: SNF pending Status is: Inpatient   Remains inpatient appropriate because: unsafe discharge  SNF pending.            LOS: 6 days   Time spent: 35 minutes with more than 50% on [place], MD [facility] Pager [phone]  If 7PM-7AM, please contact night-coverage 11/01/2021, 10:28 AM

## 2021-11-01 NOTE — Progress Notes (Signed)
°  Mobility Specialist Criteria Algorithm Info.   11/01/21 1057  Mobility  Activity Refused mobility   Patient very reluctant to participate this morning. Despite max encouragement and education on acute therapy services, ambulation/activity frequency, and pain management he declined stating "I prefer to be premedicated prior to therapy". Will check back as time permits  03/09/1039 45:91 AM  Edward Singleton, [place], [place]  LWUZR:[phone] Office: [phone]

## 2021-11-01 NOTE — NC FL2 (Signed)
[place] LEVEL OF CARE SCREENING TOOL     IDENTIFICATION  Patient Name: Edward Singleton Birthdate: 10-Jul-1951 Sex: male Admission Date (Current Location): 10/26/2021  [facility] and [place] Number:  Herbalist and Address:  [facility], [place] [address], [place], [place] [address]      Provider Number: [id number]  Attending Physician Name and Address:  Nolberto Hanlon, MD  Relative Name and Phone Number:  Chauncy Passy Sister   [phone]    Current Level of Care: Hospital Recommended Level of Care: [place] Prior Approval Number:    Date Approved/Denied:   PASRR Number: [id number]  Discharge Plan: SNF    Current Diagnoses: Patient Active Problem List   Diagnosis Date Noted   Streptococcal bacteremia 10/26/2021   Closed fracture of right proximal humerus 10/26/2021   Urinary retention 10/26/2021   Hyponatremia 27/78/2423   Metabolic acidosis    Shock ([place])    Rhabdomyolysis 10/18/2021   Delayed emergence from anesthesia 03/18/2019   Hypokalemia 53/61/4431   Alcoholic ketoacidosis 54/00/8676   Alcohol withdrawal syndrome with complication ([place]) 19/50/9326   Diabetes ([place]) 02/21/2019   Left rotator cuff tear arthropathy 12/28/2015   Hyperlipidemia 12/24/2015   Tobacco abuse 11/06/2015   Mood disturbance 11/06/2015   CAD (coronary artery disease) 10/16/2015   PAD (peripheral artery disease) ([place]) 10/16/2015   Preventative health care 08/13/2015   Renal artery stenosis ([place]) 07/30/2015   Acute renal failure superimposed on stage 3b chronic kidney disease ([place]) 07/30/2015   Kidney atrophy 07/30/2015   Chronic renal failure, stage 3b ([place]) 07/30/2015   Acute renal failure (ARF) ([place]) 07/23/2015   Insomnia 07/22/2015   Anemia 07/15/2015   History of substance abuse ([place]) 07/15/2015   BPH (benign prostatic hyperplasia) 07/15/2015   Gout 07/15/2015   Kidney stones 05/11/2015   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[place])  04/08/2015   HTN (hypertension) 11/17/2014   Alcohol abuse 11/17/2014    Orientation RESPIRATION BLADDER Height & Weight     Self, Time, Place  Normal Continent, Indwelling catheter Weight: 159 lb 13.3 oz (72.5 kg) Height:  5\' 8"  (172.7 cm)  BEHAVIORAL SYMPTOMS/MOOD NEUROLOGICAL BOWEL NUTRITION STATUS      Continent Diet (see discharge summary)  AMBULATORY STATUS COMMUNICATION OF NEEDS Skin   Limited Assist Verbally Surgical wounds                       Personal Care Assistance Level of Assistance  Bathing, Feeding, Dressing Bathing Assistance: Maximum assistance Feeding assistance: Limited assistance Dressing Assistance: Maximum assistance     Functional Limitations Info  Sight, Hearing, Speech   Hearing Info: Adequate Speech Info: Adequate    SPECIAL CARE FACTORS FREQUENCY  PT (By licensed PT), OT (By licensed OT)     PT Frequency: 5x week OT Frequency: 5x week            Contractures Contractures Info: Not present    Additional Factors Info  Code Status, Allergies, Insulin Sliding Scale Code Status Info: full Allergies Info: NKA   Insulin Sliding Scale Info: Novolog, 0-9 units q4 hours.  See discharge summary.       Current Medications (11/01/2021):  This is the current hospital active medication list Current Facility-Administered Medications  Medication Dose Route Frequency Provider Last Rate Last Admin   acetaminophen (TYLENOL) tablet 650 mg  650 mg Oral Q6H PRN Ethelda Chick, PA-C   650 mg at 10/31/21 1053   aspirin  EC tablet 81 mg  81 mg Oral Daily Nolberto Hanlon, MD   81 mg at 11/01/21 1829   bisacodyl (DULCOLAX) suppository 10 mg  10 mg Rectal Daily PRN Ethelda Chick, PA-C       ceFAZolin (ANCEF) IVPB 2g/100 mL premix  2 g Intravenous Q8H McBane, Maylene Roes, PA-C 200 mL/hr at 11/01/21 0509 2 g at 11/01/21 9371   Chlorhexidine Gluconate Cloth 2 % PADS 6 each  6 each Topical Daily Nolberto Hanlon, MD   6 each at 10/31/21 0827    diphenhydrAMINE (BENADRYL) 12.5 MG/5ML elixir 12.5-25 mg  12.5-25 mg Oral Q4H PRN Ethelda Chick, PA-C   25 mg at 69/67/89 3810   folic acid (FOLVITE) tablet 1 mg  1 mg Oral Daily Ethelda Chick, PA-C   1 mg at 11/01/21 1751   insulin aspart (novoLOG) injection 0-9 Units  0-9 Units Subcutaneous Q4H Ethelda Chick, PA-C   1 Units at 10/31/21 1643   menthol-cetylpyridinium (CEPACOL) lozenge 3 mg  1 lozenge Oral PRN McBane, Maylene Roes, PA-C       Or   phenol (CHLORASEPTIC) mouth spray 1 spray  1 spray Mouth/Throat PRN McBane, Maylene Roes, PA-C       methocarbamol (ROBAXIN) tablet 500 mg  500 mg Oral Q6H PRN Ethelda Chick, PA-C   500 mg at 11/01/21 0155   Or   methocarbamol (ROBAXIN) 500 mg in dextrose 5 % 50 mL IVPB  500 mg Intravenous Q6H PRN McBane, Maylene Roes, PA-C       metoCLOPramide (REGLAN) tablet 5-10 mg  5-10 mg Oral Q8H PRN McBane, Maylene Roes, PA-C       Or   metoCLOPramide (REGLAN) injection 5-10 mg  5-10 mg Intravenous Q8H PRN McBane, Caroline N, PA-C       ondansetron (ZOFRAN) tablet 4 mg  4 mg Oral Q6H PRN McBane, Maylene Roes, PA-C       Or   ondansetron (ZOFRAN) injection 4 mg  4 mg Intravenous Q6H PRN McBane, Maylene Roes, PA-C       oxyCODONE (Oxy IR/ROXICODONE) immediate release tablet 5 mg  5 mg Oral Q6H PRN Ethelda Chick, PA-C   5 mg at 11/01/21 0258   oxyCODONE (Oxy IR/ROXICODONE) immediate release tablet 7.5 mg  7.5 mg Oral Q6H PRN Ethelda Chick, PA-C   7.5 mg at 10/30/21 1730   polyethylene glycol (MIRALAX / GLYCOLAX) packet 17 g  17 g Oral Daily PRN McBane, Caroline N, PA-C       polyethylene glycol (MIRALAX / GLYCOLAX) packet 17 g  17 g Oral BID Nolberto Hanlon, MD   17 g at 10/31/21 1053   potassium chloride SA (KLOR-CON M) CR tablet 40 mEq  40 mEq Oral BID Nolberto Hanlon, MD       rosuvastatin (CRESTOR) tablet 20 mg  20 mg Oral Daily Ethelda Chick, PA-C   20 mg at 11/01/21 5277   senna-docusate (Senokot-S) tablet 2 tablet  2 tablet Oral BID Nolberto Hanlon, MD   2 tablet at 10/31/21 2156   thiamine tablet 100 mg  100 mg Oral Daily Ethelda Chick, PA-C   100 mg at 11/01/21 8242     Discharge Medications: Please see discharge summary for a list of discharge medications.  Relevant Imaging Results:  Relevant Lab Results:   Additional Information PNT:[id number]  Joanne Chars, LCSW

## 2021-11-01 NOTE — TOC Progression Note (Signed)
Transition of Care [facility]) - Progression Note    Patient Details  Name: VAHE PIENTA MRN: [id number] Date of Birth: December 25, 1950  [facility]) CM/SW Contact  Joanne Chars, LCSW Phone Number: 11/01/2021, 11:02 AM  Clinical Narrative:   CSW spoke with pt sister Chauncy Passy who said that pt definitely needs SNF rehab prior to coming home.  Pt and sister both live towards [place] and facility there would be her first choice.  CSW spoke with pt who is now also agreeable to SNF, choice document given.  Discussed options and pt asked CSW to talk with sister about facility choice.  Permission given to send out referral in hub.  FL2 completed and referral sent out in hub for SNF.    Expected Discharge Plan: [place] (vs home with home health services) Barriers to Discharge: Continued Medical Work up  Expected Discharge Plan and Services Expected Discharge Plan: [place] (vs home with home health services)   Discharge Planning Services: CM Consult   Living arrangements for the past 2 months: [place]: [place] Date [place]: 10/27/21 Time [place]: 1109 Representative spoke with at [place]: [place] ([place]) Interventions    Readmission Risk Interventions Readmission Risk Prevention Plan 03/15/2019  Transportation Screening Complete  PCP or Specialist Appt within 3-5 Days Complete  HRI or [place] Complete  Social Work Consult for [place] Planning/Counseling Complete  Palliative Care Screening Complete  Medication Review Press photographer) Complete  Some recent data might be hidden

## 2021-11-01 NOTE — Final Progress Note (Signed)
Bladder scanned 372ml and attempted to in/out cath but was unable to advance catheter.

## 2021-11-02 ENCOUNTER — Inpatient Hospital Stay (HOSPITAL_COMMUNITY): Payer: No Typology Code available for payment source

## 2021-11-02 LAB — RESP PANEL BY RT-PCR (FLU A&B, COVID) ARPGX2
Influenza A by PCR: NEGATIVE
Influenza B by PCR: NEGATIVE
SARS Coronavirus 2 by RT PCR: NEGATIVE

## 2021-11-02 LAB — POTASSIUM: Potassium: 3.9 mmol/L (ref 3.5–5.1)

## 2021-11-02 LAB — GLUCOSE, CAPILLARY
Glucose-Capillary: 96 mg/dL (ref 70–99)
Glucose-Capillary: 82 mg/dL (ref 70–99)
Glucose-Capillary: 92 mg/dL (ref 70–99)
Glucose-Capillary: 104 mg/dL — ABNORMAL HIGH (ref 70–99)
Glucose-Capillary: 106 mg/dL — ABNORMAL HIGH (ref 70–99)
Glucose-Capillary: 98 mg/dL (ref 70–99)

## 2021-11-02 MED ORDER — ASPIRIN 81 MG PO TBEC: 81.0000 mg | DELAYED_RELEASE_TABLET | Freq: Every day | ORAL | 11 refills | Status: DC

## 2021-11-02 MED ORDER — AMOXICILLIN-POT CLAVULANATE 875-125 MG PO TABS: 1.0000 | ORAL_TABLET | Freq: Two times a day (BID) | ORAL | 0 refills | Status: AC

## 2021-11-02 MED ORDER — BISACODYL 10 MG RE SUPP: 10.0000 mg | Freq: Every day | RECTAL | 0 refills | Status: DC | PRN

## 2021-11-02 MED ORDER — HYDROCODONE-ACETAMINOPHEN 5-325 MG PO TABS: 1.0000 | ORAL_TABLET | Freq: Four times a day (QID) | ORAL | 0 refills | Status: DC | PRN

## 2021-11-02 MED ORDER — AMOXICILLIN-POT CLAVULANATE 875-125 MG PO TABS
1.0000 | ORAL_TABLET | Freq: Two times a day (BID) | ORAL | Status: DC
  Administered 2021-11-02: 13:00:00 1 via ORAL
  Filled 2021-11-02: qty 1

## 2021-11-02 MED ORDER — SENNOSIDES-DOCUSATE SODIUM 8.6-50 MG PO TABS: 2.0000 | ORAL_TABLET | Freq: Two times a day (BID) | ORAL | Status: DC

## 2021-11-02 MED ORDER — METHOCARBAMOL 500 MG PO TABS: 500.0000 mg | ORAL_TABLET | Freq: Four times a day (QID) | ORAL | Status: DC | PRN

## 2021-11-02 MED ORDER — POLYETHYLENE GLYCOL 3350 17 G PO PACK: 17.0000 g | PACK | Freq: Two times a day (BID) | ORAL | 0 refills | Status: DC

## 2021-11-02 NOTE — Discharge Instructions (Signed)
Ophelia Charter MD, MPH Noemi Chapel, PA-C [place] [address] [phone] (tel)   [phone] (fax)   [place] may leave the operative dressing in place until your follow-up appointment. KEEP THE INCISIONS CLEAN AND DRY. There may be a small amount of fluid/bleeding leaking at the surgical site. This is normal after surgery.  If it fills with liquid or blood please call us immediately to change it for you. Use the provided ice machine or Ice packs as often as possible for the first 3-4 days, then as needed for pain relief.   Keep a layer of cloth or a shirt between your skin and the cooling unit to prevent frost bite as it can get very cold.  SHOWERING: - You may shower on Post-Op Day #2.  - The dressing is water resistant but do not scrub it as it may start to peel up.   - You may remove the sling for showering, but keep a water resistant pillow under the arm to keep both the  elbow and shoulder away from the body (mimicking the abduction sling).  - Gently pat the area dry.  - Do not soak the shoulder in water. Do not go swimming in the pool or ocean until your sutures are removed. - KEEP THE INCISIONS CLEAN AND DRY.  EXERCISES Wear the sling at all times You may remove the sling for showering, but keep the arm across the chest or in a secondary sling.     FOLLOW-UP If you develop a Fever (>101.5), Redness or Drainage from the surgical incision site, please call our office to arrange for an evaluation. Please call the office to schedule a follow-up appointment for a wound check, 10-14 days post-operatively.  IF YOU HAVE ANY QUESTIONS, PLEASE FEEL FREE TO CALL OUR OFFICE.  HELPFUL INFORMATION  If you had a block, it will wear off between 8-24 hrs postop typically.  This is period when your pain may go from nearly zero to the pain you would have had post-op without the  block.  This is an abrupt transition but nothing dangerous is happening.  You may take an extra dose of narcotic when this happens.  Your arm will be in a sling following surgery. You will be in this sling for the next 4 weeks.  I will let you know the exact duration at your follow-up visit.  You may be more comfortable sleeping in a semi-seated position the first few nights following surgery.  Keep a pillow propped under the elbow and forearm for comfort.  If you have a recliner type of chair it might be beneficial.  If not that is fine too, but it would be helpful to sleep propped up with pillows behind your operated shoulder as well under your elbow and forearm.  This will reduce pulling on the suture lines.  When dressing, put your operative arm in the sleeve first.  When getting undressed, take your operative arm out last.  Loose fitting, button-down shirts are recommended.  In most states it is against the law to drive while your arm is in a sling. And certainly against the law to drive while taking narcotics.  You may return to work/school in the next couple of days when you feel up to it. Desk work and typing in the sling is fine.  We suggest you use the pain medication the first night prior to going to  bed, in order to ease any pain when the anesthesia wears off. You should avoid taking pain medications on an empty stomach as it will make you nauseous.  Do not drink alcoholic beverages or take illicit drugs when taking pain medications.  Pain medication may make you constipated.  Below are a few solutions to try in this order: Decrease the amount of pain medication if you arent having pain. Drink lots of decaffeinated fluids. Drink prune juice and/or each dried prunes  If the first 3 dont work start with additional solutions Take Colace - an over-the-counter stool softener Take Senokot - an over-the-counter laxative Take Miralax - a stronger over-the-counter laxative   Dental  Antibiotics:  In most cases prophylactic antibiotics for Dental procdeures after total joint surgery are not necessary.  Exceptions are as follows:  1. History of prior total joint infection  2. Severely immunocompromised (Organ Transplant, cancer chemotherapy, Rheumatoid biologic meds such as [place])  3. Poorly controlled diabetes (A1C &gt; 8.0, blood glucose over 200)  If you have one of these conditions, contact your surgeon for an antibiotic prescription, prior to your dental procedure.   For more information including helpful videos and documents visit our website:   [email or web address]

## 2021-11-02 NOTE — Progress Notes (Signed)
Patient swabbed for respiratory panel and specimen sent to lab.

## 2021-11-02 NOTE — Progress Notes (Signed)
Report called to Glenard Haring, RN at [facility].  Awaiting PTAR.

## 2021-11-02 NOTE — Discharge Summary (Addendum)
Edward Singleton DDU:[id number] DOB: [birth year] DOA: 10/26/2021  PCP: Windy Fast, MD  Admit date: 10/26/2021 Discharge date: 11/02/2021  Admitted From: Home Disposition: SNF  Recommendations for Outpatient Follow-up:  Follow up with PCP in 1 week Please obtain BMP/CBC in one week Please follow up ID in 1 to 2 weeks Follow-up with orthopedic surgery in 2 weeks for suture removal     Discharge Condition:Stable CODE STATUS:full  Diet recommendation:  Carb Modified Brief/Interim Summary: Per CBJ:SEGBTD Edward Singleton is a pleasant 71 y.o. male with medical history significant for alcohol abuse, chronic renal insufficiency, anemia, hypertension, and type 2 diabetes mellitus who has been transferred from [facility] for operative management of proximal right humerus fracture.  He had presented to [facility] ED on 10/18/2021 after he had not been seen for several days and was found down at home after police forced entry, had significant bruising on his right arm, was confused, hypotensive, and hypothermic.  Per report of his sister, Edward Singleton, who is healthcare power of attorney, he had been binge drinking since the summer.   [facility] ED and Hospital Course: Upon arrival to the ED, patient was found to be hypotensive with systolic pressures in the 70s, hypothermic to 32.4 C.  He was found to be in rhabdomyolysis with AKI superimposed on CKD, had initial lactic acid greater than 8, elevated procalcitonin, proximal right humerus fracture, and prolonged QT interval.   He was fluid resuscitated, given warmed IV fluids initially, admitted to the ICU on 10/18/2021, was requiring Levophed, but was stabilized and off of Levophed by 10/20/2021.   He was seen by nephrology during the hospitalization, serum CK trended down, and his renal function returned to baseline.   He had a streptococcal bacteremia, was evaluated by ID. He was transferred to [facility] for arthroplasty to be done by orthopedic surgery .  He underwent surgery on  10/27/2021.  At the beginning post surgery he has been confused combative and was pulling on his wound VAC couple of times.  Normally he becomes confused at night.  His mood has improved.  Also patient had Foley placed and did a voiding trial which he failed and Foley was replaced.  Will need voiding trial in 3 days.  He is medically stable to be discharged.  He has been cleared by orthopedic surgery to be discharged.  1. Proximal right humerus fracture  - Presents in transfer from [facility] for orthopedic surgery consult, likely reverse arthoplasty  status post reverse shoulder arthroplasty 1/18 Weightbearing: NWB RUE  -           NO ROM of right shoulder for 4 weeks.  -           Okay for wrist/hand exercises.  Insicional and dressing care: Prevena removed. New Aquacel placed.  Orthopedic device(s): Sling Showering: Okay to shower with assistance. Keep dressing CDI.  VTE prophylaxis: no orthopedic contraindications. Okay to resume aspirin Pain control: PRN pain medications, minimize narcotics as able. Follow - up plan: with orthopedic surgery in 2 weeks in office for suture removal           2. Strep bacteremia   - Seen by ID at [facility], source unclear, subsequent cultures negative, Per ID-This bacteria can be associated with Colon malignancy and has the potential to cause endocarditis, but low bioburden and with staph epidermidis in the same culture contamination can be questioned  repeated  imaging of rt shoulder and arm and there was no abscess or collection- Was treated with  Cefazolin while inpatient.  Will need to complete antibiotic course with last dose on 11/03/2021, will be discharged on amoxicillin to complete course. F/U with ID as outpatient in 1- 2 weeks.          3. Leukocytosis improved           4. CAD; PAD  Asymptomatic currently without anginal complaints continue statin and aspirin                5. CKD IIIb  At baseline Avoid nephrotoxin  Encourage po   hydration         6. Urinary retention  - Foley placed at [facility] and present on arrival here  Foley was discontinued on the 20th.  He is having urinary retention requiring in and out cath  Failed voiding trial , foley reinserted on 1//23. Do voiding trial in 3 days. Needs aggressive bowel regimen to be continued prior to voiding trial.     7.consitpation continue aggressive bowel regimen     8. Hypokalemia; hyponatremia  Replaced stable   9. Anemia  - He was transfused on 1/12  H&H stable    10. Alcohol abuse  Was placed on CIWA protocol No further withdrawal Counseled extensively during his hospitalization about alcohol cessation  11.DM II with hyperglycemia Was on RISS On metformin, resume when he takes more po intake. Can do RISS if not taking po intake.    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)   Anemia   Acute renal failure superimposed on stage 3b chronic kidney disease (HCC)   Chronic renal failure, stage 3b (HCC)   CAD (coronary artery disease)   PAD (peripheral artery disease) (HCC)   Hypokalemia   Rhabdomyolysis   Streptococcal bacteremia   Urinary retention   Hyponatremia    Discharge Instructions  Discharge Instructions     Diet - low sodium heart healthy   Complete by: As directed    Diet Carb Modified   Complete by: As directed    Discharge wound care:   Complete by: As directed    As above   Increase activity slowly   Complete by: As directed       Allergies as of 11/02/2021   No Known Allergies      Medication List     TAKE these medications    amoxicillin-clavulanate 875-125 MG tablet Commonly known as: AUGMENTIN Take 1 tablet by mouth every 12 (twelve) hours for 2 days.   aspirin 81 MG EC tablet Take 1 tablet (81 mg total) by mouth daily. Swallow whole. Start taking on: November 03, 2021   bisacodyl 10 MG  suppository Commonly known as: DULCOLAX Place 1 suppository (10 mg total) rectally daily as needed for moderate constipation.   folic acid 1 MG tablet Commonly known as: FOLVITE Take 1 mg by mouth daily.   HYDROcodone-acetaminophen 5-325 MG tablet Commonly known as: Norco Take 1 tablet by mouth every 6 (six) hours as needed for severe pain.   metFORMIN 500 MG tablet Commonly known as: GLUCOPHAGE Take 500 mg by mouth 2 (two) times daily.   methocarbamol 500 MG tablet Commonly known as: ROBAXIN Take 1 tablet (500 mg total) by mouth every 6 (six) hours as needed for muscle spasms.   polyethylene glycol 17 g packet Commonly known as: MIRALAX / GLYCOLAX Take 17 g by mouth 2 (two) times daily.  rosuvastatin 20 MG tablet Commonly known as: CRESTOR Take 20 mg by mouth daily.   senna-docusate 8.6-50 MG tablet Commonly known as: Senokot-S Take 2 tablets by mouth 2 (two) times daily.   thiamine 100 MG tablet Commonly known as: Vitamin B-1 Take 100 mg by mouth daily.               Discharge Care Instructions  (From admission, onward)           Start     Ordered   11/02/21 0000  Discharge wound care:       Comments: As above   11/02/21 1223            Follow-up Information     Windy Fast, MD Follow up.   Specialty: Internal Medicine Contact information: [address] [place] [address]         Varkey, Dax T, MD Follow up in 2 week(s).   Specialty: Orthopedic Surgery Why: For suture removal Contact information: [address] [place] [address] [phone]         Tsosie Billing, MD Follow up in 1 week(s).   Specialty: Infectious Diseases Why: 1-2 weeks Contact information: [place] [address] [phone]                No Known Allergies  Consultations: Orthopedic surgery, infectious disease   Procedures/Studies: CT ABDOMEN PELVIS WO CONTRAST  Result Date:  10/18/2021 CLINICAL DATA:  Abdominal pain, acute, nonlocalized. Found on floor. EXAM: CT ABDOMEN AND PELVIS WITHOUT CONTRAST TECHNIQUE: Multidetector CT imaging of the abdomen and pelvis was performed following the standard protocol without IV contrast. COMPARISON:  05/11/2015 FINDINGS: Lower chest: Small to moderate-sized hiatal hernia. Fluid in the distal esophagus may reflect reflux. No effusions. Coronary artery and aortic calcifications. Hepatobiliary: No focal hepatic abnormality. Gallbladder unremarkable. Pancreas: No focal abnormality or ductal dilatation. Spleen: Prior splenectomy Adrenals/Urinary Tract: Left kidney is atrophic. 5.3 cm cyst off the midpole of the left kidney. Bilateral nephrolithiasis. No hydronephrosis. Small scattered low-density lesions in the right kidney likely reflects cysts. Adrenal glands unremarkable. Foley catheter in the bladder which is decompressed. Stomach/Bowel: Sigmoid diverticulosis. No active diverticulitis. Mild distention of the stomach. Small bowel is decompressed. Vascular/Lymphatic: Heavily calcified aorta and branch vessels. No aneurysm or adenopathy. Reproductive: No visible focal abnormality. Other: No free fluid or free air. Musculoskeletal: Old healed fractures of the left superior and inferior pubic rami. No acute bony abnormality. IMPRESSION: Bilateral nephrolithiasis.  No hydronephrosis. Small to moderate-sized hiatal hernia. Fluid in the distal esophagus may be related to reflux or dysmotility. Sigmoid diverticulosis.  No active diverticulitis. Heavily calcified aorta. Electronically Signed   By: Rolm Baptise M.D.   On: 10/18/2021 20:26   DG Chest 1 View  Result Date: 10/20/2021 CLINICAL DATA:  Shortness of breath EXAM: CHEST  1 VIEW COMPARISON:  10/19/2021 FINDINGS: Right IJ line with tip at the SVC. Generous lung volumes with mild interstitial coarsening attributed to COPD. Normal heart size and mediastinal contours. There is no edema, consolidation,  effusion, or pneumothorax. IMPRESSION: No active disease. Electronically Signed   By: Jorje Guild M.D.   On: 10/20/2021 04:02   CT HEAD WO CONTRAST (5MM)  Result Date: 10/18/2021 CLINICAL DATA:  Head trauma, minor (Age >= 65y).  Found on floor. EXAM: CT HEAD WITHOUT CONTRAST TECHNIQUE: Contiguous axial images were obtained from the base of the skull through the vertex without intravenous contrast. 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 There is atrophy and chronic small vessel disease changes. No acute intracranial abnormality. Specifically, no hemorrhage, hydrocephalus, mass lesion, acute infarction, or significant intracranial injury. Vascular: No hyperdense vessel or unexpected calcification. Skull: No acute calvarial abnormality. 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 Atrophy, chronic microvascular disease. No acute intracranial abnormality. Electronically Signed   By: Rolm Baptise M.D.   On: 10/18/2021 20:18   CT Cervical Spine Wo Contrast  Result Date: 10/18/2021 CLINICAL DATA:  Neck trauma (Age >= 65y).  Found on floor. EXAM: CT CERVICAL SPINE WITHOUT CONTRAST TECHNIQUE: Multidetector CT imaging of the cervical spine was performed without intravenous contrast. Multiplanar CT image reconstructions were also generated. COMPARISON:  03/14/2019 FINDINGS: Alignment: No subluxation. Skull base and vertebrae: No acute fracture. No primary bone lesion or focal pathologic process. Soft tissues and spinal canal: No prevertebral fluid or swelling. No visible canal hematoma. Disc levels: Degenerative disc disease at C5-6 and C6-7 with disc space narrowing and spurring. Mild to moderate degenerative facet disease bilaterally, right greater than left. Upper chest: No acute findings Other: None IMPRESSION: No acute bony abnormality. Electronically Signed   By: Rolm Baptise M.D.   On:  10/18/2021 20:20   CT HUMERUS RIGHT WO CONTRAST  Result Date: 10/22/2021 CLINICAL DATA:  Chronic alcohol abuse.  Shoulder trauma. EXAM: CT OF THE RIGHT HUMERUS WITHOUT CONTRAST CT OF THE RIGHT FOREARM WITHOUT CONTRAST TECHNIQUE: Multidetector CT imaging was performed of the right humerus according to the standard protocol. Multiplanar CT image reconstructions were also generated. Multidetector CT imaging was performed of the right forearm according to the standard protocol. Multiplanar CT image reconstructions were also generated. RADIATION DOSE REDUCTION: This exam was performed according to the departmental dose-optimization program which includes automated exposure control, adjustment of the mA and/or kV according to patient size and/or use of iterative reconstruction technique. COMPARISON:  None. FINDINGS: Bones/Joint/Cartilage Generalized osteopenia. Severely comminuted and displaced fracture of the surgical neck of right proximal humerus. 4 cm of lateral displacement, 2 cm of superior displacement. Numerous fracture fragments are located along the anterior humeral head. Severely comminuted fracture of the greater tuberosity and lesser tuberosity. Fracture cleft extends to the superior articular surface. No glenohumeral dislocation. Mild arthropathy of the acromioclavicular joint. No acute fracture or dislocation of the radius and ulna. No elbow fracture or dislocation. 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 Ligaments Ligaments are suboptimally evaluated by CT. Muscles and Tendons Focal area of atrophy in the biceps muscle likely reflecting prior injury. Soft tissue edema in the biceps muscle concerning for muscle strain. Soft tissue No fluid collection or hematoma. No soft tissue mass. Severe soft tissue edema throughout the upper arm and forearm with skin thickening which may reflect reactive edema versus cellulitis. No drainable fluid collection. IMPRESSION: 1. Severely  comminuted and displaced fracture of the surgical neck of right proximal humerus. 4 cm of lateral displacement, 2 cm of superior displacement. Numerous fracture fragments are located along the anterior humeral head. Severely comminuted fracture of the greater tuberosity and lesser tuberosity. Fracture cleft extends to the superior articular surface. 2.  No acute osseous injury of the right forearm. 3. 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 Electronically Signed   By: Kathreen Devoid M.D.   On: 10/22/2021 11:57   CT SHOULDER RIGHT WO CONTRAST  Result Date: 10/18/2021 CLINICAL DATA:  Proximal humeral fracture. EXAM: CT  OF THE UPPER RIGHT EXTREMITY WITHOUT CONTRAST TECHNIQUE: Multidetector CT imaging of the upper right extremity was performed according to the standard protocol. COMPARISON:  Radiograph dated 10/18/2021. FINDINGS: Bones/Joint/Cartilage There is a displaced and comminuted fracture of the proximal right humerus involving the humeral neck and proximal humeral diaphysis. Multiple displaced fracture fragments noted. There is proximal migration of the humeral shaft in relation to the head of the humerus. There is lateral positioning of the humeral diaphysis in relation to the head of the humerus. There is comminuted fractures of the humeral head. The bones are osteopenic. There is no dislocation. Ligaments Suboptimally assessed by CT. Muscles and Tendons There is intramuscular edema. No fluid collection or large hematoma. Soft tissues Diffuse subcutaneous edema of the right upper extremity and right axilla. No fluid collection or large hematoma. IMPRESSION: 1. Displaced and comminuted fracture of the proximal right humerus involving the humeral neck and proximal humeral diaphysis. 2. Comminuted fracture of the humeral head. Electronically Signed   By: Anner Crete M.D.   On: 10/18/2021 23:03   CT FOREARM RIGHT WO CONTRAST  Result Date: 10/22/2021 CLINICAL DATA:   Chronic alcohol abuse.  Shoulder trauma. EXAM: CT OF THE RIGHT HUMERUS WITHOUT CONTRAST CT OF THE RIGHT FOREARM WITHOUT CONTRAST TECHNIQUE: Multidetector CT imaging was performed of the right humerus according to the standard protocol. Multiplanar CT image reconstructions were also generated. Multidetector CT imaging was performed of the right forearm according to the standard protocol. Multiplanar CT image reconstructions were also generated. RADIATION DOSE REDUCTION: This exam was performed according to the departmental dose-optimization program which includes automated exposure control, adjustment of the mA and/or kV according to patient size and/or use of iterative reconstruction technique. COMPARISON:  None. FINDINGS: Bones/Joint/Cartilage Generalized osteopenia. Severely comminuted and displaced fracture of the surgical neck of right proximal humerus. 4 cm of lateral displacement, 2 cm of superior displacement. Numerous fracture fragments are located along the anterior humeral head. Severely comminuted fracture of the greater tuberosity and lesser tuberosity. Fracture cleft extends to the superior articular surface. No glenohumeral dislocation. Mild arthropathy of the acromioclavicular joint. No acute fracture or dislocation of the radius and ulna. No elbow fracture or dislocation. 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 Ligaments Ligaments are suboptimally evaluated by CT. Muscles and Tendons Focal area of atrophy in the biceps muscle likely reflecting prior injury. Soft tissue edema in the biceps muscle concerning for muscle strain. Soft tissue No fluid collection or hematoma. No soft tissue mass. Severe soft tissue edema throughout the upper arm and forearm with skin thickening which may reflect reactive edema versus cellulitis. No drainable fluid collection. IMPRESSION: 1. Severely comminuted and displaced fracture of the surgical neck of right proximal humerus. 4 cm of  lateral displacement, 2 cm of superior displacement. Numerous fracture fragments are located along the anterior humeral head. Severely comminuted fracture of the greater tuberosity and lesser tuberosity. Fracture cleft extends to the superior articular surface. 2.  No acute osseous injury of the right forearm. 3. 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 Electronically Signed   By: Kathreen Devoid M.D.   On: 10/22/2021 11:57   DG CHEST PORT 1 VIEW  Result Date: 10/26/2021 CLINICAL DATA:  Preoperative assessment, right humeral fracture EXAM: PORTABLE CHEST 1 VIEW COMPARISON:  10/20/2021 FINDINGS: Single frontal view of the chest demonstrates a stable cardiac silhouette. No acute airspace disease, effusion, or pneumothorax. Stable emphysema. No acute bony abnormality. Stable bilateral healed rib fractures. IMPRESSION: 1. Stable  emphysema.  No acute intrathoracic process. Electronically Signed   By: Randa Ngo M.D.   On: 10/26/2021 23:56   DG Chest Port 1 View  Result Date: 10/19/2021 CLINICAL DATA:  Central line placement EXAM: PORTABLE CHEST 1 VIEW COMPARISON:  10/18/2021 FINDINGS: Right central line tip in the SVC. No pneumothorax. Heart and mediastinal contours are within normal limits. No focal opacities or effusions. No acute bony abnormality. IMPRESSION: Right central line tip in the SVC.  No pneumothorax. No active cardiopulmonary disease. Electronically Signed   By: Rolm Baptise M.D.   On: 10/19/2021 01:20   DG Chest Portable 1 View  Result Date: 10/18/2021 CLINICAL DATA:  Shortness of breath.  Right upper arm bruising EXAM: PORTABLE CHEST 1 VIEW COMPARISON:  03/15/2019 FINDINGS: Heart size within normal limits. Atherosclerotic calcification of the aortic knob. No focal airspace consolidation, pleural effusion, or pneumothorax. Chronic left-sided rib fractures. Remote ununited left clavicular fracture. Acute comminuted, displaced, and angulated proximal right humeral  fracture. IMPRESSION: 1. No active cardiopulmonary disease. 2. Acute comminuted, displaced, and angulated proximal right humeral fracture. Electronically Signed   By: Davina Poke D.O.   On: 10/18/2021 18:20   DG Shoulder Right Port  Result Date: 11/02/2021 CLINICAL DATA:  Postop check EXAM: RIGHT SHOULDER - 1 VIEW COMPARISON:  10/28/2021 FINDINGS: Postop right shoulder arthroplasty. Hardware in satisfactory position. Multiple fracture fragments are seen around the proximal humerus unchanged from the prior study. IMPRESSION: Satisfactory right shoulder arthroplasty for fracture repair. Electronically Signed   By: Franchot Gallo M.D.   On: 11/02/2021 10:15   DG Shoulder Right Port  Result Date: 10/27/2021 CLINICAL DATA:  Status post right shoulder replacement EXAM: RIGHT SHOULDER - 1 VIEW COMPARISON:  10/18/2021 FINDINGS: Shoulder prosthesis is now seen in satisfactory position. Bony fragments are noted along the proximal humerus consistent with the prior injury. No acute bony abnormality is noted. IMPRESSION: Status post right shoulder replacement. Electronically Signed   By: Inez Catalina M.D.   On: 10/27/2021 19:06   DG Humerus Right  Result Date: 11/02/2021 CLINICAL DATA:  Postop check EXAM: RIGHT HUMERUS - 2+ VIEW COMPARISON:  10/28/2021 FINDINGS: Postop reverse shoulder arthroplasty. Hardware in satisfactory position. Comminuted fracture fragments are seen in the soft tissues around the proximal humerus. No change from the prior study. No acute complication identified. IMPRESSION: Satisfactory right shoulder arthroplasty for fracture repair Electronically Signed   By: Franchot Gallo M.D.   On: 11/02/2021 10:17   DG Humerus Right  Result Date: 10/28/2021 CLINICAL DATA:  RIGHT shoulder reverse arthroplasty following traumatic fracture EXAM: RIGHT HUMERUS - 2+ VIEW COMPARISON:  Radiograph 1 9 23  FINDINGS: Interval reverse arthroplasty for repair of humeral neck fracture. Comminution about the  fracture plane. Humerus returned to anatomical orientation. Prosthetic appears intact and located. Expected soft tissue changes superior to the joint. IMPRESSION: Reverse total arthroplasty for repair of humeral neck fracture. No complicating features Electronically Signed   By: Suzy Bouchard M.D.   On: 10/28/2021 10:10   DG Humerus Right  Result Date: 10/18/2021 CLINICAL DATA:  Right upper extremity bruising.  Found down. EXAM: RIGHT HUMERUS - 2+ VIEW COMPARISON:  None. FINDINGS: Acute comminuted fracture of the proximal right humerus with approximately 90 degrees of varus angulation. Dominant butterfly fragment measuring up to 7.5 cm which is medially displaced relative to the humeral shaft. Humeral head appears to be in articulation with the glenoid without evidence of dislocation. AC joint intact. Degenerative changes of the elbow. Prominent soft tissue swelling  the fracture site. IMPRESSION: Acute comminuted, displaced, and angulated fracture of the proximal right humerus as described. Electronically Signed   By: Davina Poke D.O.   On: 10/18/2021 18:22   ECHOCARDIOGRAM COMPLETE  Result Date: 10/19/2021    ECHOCARDIOGRAM REPORT   Patient Name:   Edward Singleton Date of Exam: 10/19/2021 Medical Rec #:  [id number]       Height:       68.0 in Accession #:    [id number]      Weight:       160.0 lb Date of Birth:  08-Apr-1951        BSA:          1.859 m Patient Age:    71 years        BP:           121/62 mmHg Patient Gender: M               HR:           80 bpm. Exam Location:  [facility] Procedure: 2D Echo, Color Doppler and Cardiac Doppler Indications:     I42.9 Cardiomyopathy-unspecified  History:         Patient has no prior history of Echocardiogram examinations.                  Risk Factors:Hypertension, Diabetes, HCL and Current Smoker. Hx                  of alcohol abuse.  Sonographer:     Charmayne Sheer Referring Phys:  [id number] BRITTON L R[place] Diagnosing Phys: Harrell Gave End MD  Sonographer  Comments: Technically difficult study due to poor echo windows and no apical window. IMPRESSIONS  1. Left ventricular ejection fraction, by estimation, is >55%. The left ventricle has normal function. Left ventricular endocardial border not optimally defined to evaluate regional wall motion. Left ventricular diastolic parameters are indeterminate.  2. Right ventricular systolic function is normal. The right ventricular size is normal. Tricuspid regurgitation signal is inadequate for assessing PA pressure.  3. The mitral valve was not well visualized. Unable to accurately assess mitral valve regurgitation.  4. The aortic valve is tricuspid. There is mild calcification of the aortic valve. There is mild thickening of the aortic valve. Aortic valve regurgitation not well assessed. Aortic valve gradient could not be assessed.  5. The inferior vena cava is normal in size with <50% respiratory variability, suggesting right atrial pressure of 8 mmHg. FINDINGS  Left Ventricle: Left ventricular ejection fraction, by estimation, is >55%. The left ventricle has normal function. Left ventricular endocardial border not optimally defined to evaluate regional wall motion. The left ventricular internal cavity size was  normal in size. There is no left ventricular hypertrophy. Left ventricular diastolic parameters are indeterminate. Right Ventricle: The right ventricular size is normal. No increase in right ventricular wall thickness. Right ventricular systolic function is normal. Tricuspid regurgitation signal is inadequate for assessing PA pressure. Left Atrium: Left atrial size was not well visualized. Right Atrium: Right atrial size was not well visualized. Pericardium: There is no evidence of pericardial effusion. Mitral Valve: The mitral valve was not well visualized. Mild mitral annular calcification. Unable to accurately assess mitral valve regurgitation. Tricuspid Valve: The tricuspid valve is not well visualized. Tricuspid  valve regurgitation is trivial. Aortic Valve: The aortic valve is tricuspid. There is mild calcification of the aortic valve. There is mild thickening of the aortic valve. Aortic valve regurgitation not well assessed.  Aortic valve gradient could not be assessed. Pulmonic Valve: The pulmonic valve was not well visualized. Pulmonic valve regurgitation is not visualized. No evidence of pulmonic stenosis. Aorta: The aortic root is normal in size and structure. Pulmonary Artery: The pulmonary artery is not well seen. Venous: The inferior vena cava is normal in size with less than 50% respiratory variability, suggesting right atrial pressure of 8 mmHg. IAS/Shunts: No atrial level shunt detected by color flow Doppler.  LEFT VENTRICLE PLAX 2D LVIDd:         3.83 cm LVIDs:         2.75 cm LV PW:         0.88 cm LV IVS:        0.73 cm LVOT diam:     2.40 cm LVOT Area:     4.52 cm  LEFT ATRIUM         Index LA diam:    3.80 cm 2.04 cm/m                        PULMONIC VALVE AORTA                 PV Vmax:       1.09 m/s Ao Root diam: 3.70 cm PV Vmean:      72.300 cm/s                       PV VTI:        0.185 m                       PV Peak grad:  4.8 mmHg                       PV Mean grad:  2.0 mmHg   SHUNTS Systemic Diam: 2.40 cm Nelva Bush MD Electronically signed by Nelva Bush MD Signature Date/Time: 10/19/2021/3:40:12 PM    Final       Subjective: Has no complaints.  He is very thankful about his care.  Denies shortness of breath, chest pain or any other symptoms.  Discharge Exam: Vitals:   11/01/21 1934 11/02/21 0730  BP: (!) 142/85 (!) 142/78  Pulse: 93 82  Resp: 14 18  Temp: 98.4 F (36.9 C) 98 F (36.7 C)  SpO2: 96% 94%   Vitals:   11/01/21 1602 11/01/21 1934 11/02/21 0600 11/02/21 0730  BP: 118/67 (!) 142/85  (!) 142/78  Pulse: 94 93  82  Resp: 17 14  18   Temp: 98.9 F (37.2 C) 98.4 F (36.9 C)  98 F (36.7 C)  TempSrc:  Oral  Oral  SpO2: 93% 96%  94%  Weight:   72 kg    Height:        General: Pt is alert, awake, not in acute distress Cardiovascular: RRR, S1/S2 +, no rubs, no gallops Respiratory: CTA bilaterally, no wheezing, no rhonchi Abdominal: Soft, NT, ND, bowel sounds + Extremities: no edema, no cyanosis    The results of significant diagnostics from this hospitalization (including imaging, microbiology, ancillary and laboratory) are listed below for reference.     Microbiology: Recent Results (from the past 240 hour(s))  MRSA Next Gen by PCR, Nasal     Status: None   Collection Time: 10/27/21  6:39 AM   Specimen: Nasal Mucosa; Nasal Swab  Result Value Ref Range Status   MRSA by PCR Next Gen NOT  DETECTED NOT DETECTED Final    Comment: (NOTE) The GeneXpert MRSA Assay (FDA approved for NASAL specimens only), is one component of a comprehensive MRSA colonization surveillance program. It is not intended to diagnose MRSA infection nor to guide or monitor treatment for MRSA infections. Test performance is not FDA approved in patients less than 47 years old. Performed at [facility], [place] [address], [place], [place] [address]   Aerobic/Anaerobic Culture w Gram Stain (surgical/deep wound)     Status: None   Collection Time: 10/27/21  2:53 PM   Specimen: PATH Other; Tissue  Result Value Ref Range Status   Specimen Description TISSUE  Final   Special Requests SHOULDER 1 SPEC A  Final   Gram Stain   Final    FEW WBC PRESENT, PREDOMINANTLY MONONUCLEAR NO ORGANISMS SEEN    Culture   Final    No growth aerobically or anaerobically. Performed at [facility], [place] [address] [place], [place] [address]    Report Status 11/01/2021 FINAL  Final  Aerobic/Anaerobic Culture w Gram Stain (surgical/deep wound)     Status: None   Collection Time: 10/27/21  2:54 PM   Specimen: PATH Other; Tissue  Result Value Ref Range Status   Specimen Description TISSUE  Final   Special Requests SHOULDER 2 SPEC B  Final   Gram Stain   Final    FEW  WBC PRESENT, PREDOMINANTLY MONONUCLEAR NO ORGANISMS SEEN    Culture   Final    No growth aerobically or anaerobically. Performed at [facility], [place] [address], [place], [place] [address]    Report Status 11/01/2021 FINAL  Final  Resp Panel by RT-PCR (Flu A&B, Covid) Nasopharyngeal Swab     Status: None   Collection Time: 11/02/21 10:17 AM   Specimen: Nasopharyngeal Swab; Nasopharyngeal(NP) swabs in vial transport medium  Result Value Ref Range Status   SARS Coronavirus 2 by RT PCR NEGATIVE NEGATIVE Final    Comment: (NOTE) SARS-CoV-2 target nucleic acids are NOT DETECTED.  The SARS-CoV-2 RNA is generally detectable in upper respiratory specimens during the acute phase of infection. The lowest concentration of SARS-CoV-2 viral copies this assay can detect is 138 copies/mL. A negative result does not preclude SARS-Cov-2 infection and should not be used as the sole basis for treatment or other patient management decisions. A negative result may occur with  improper specimen collection/handling, submission of specimen other than nasopharyngeal swab, presence of viral mutation(s) within the areas targeted by this assay, and inadequate number of viral copies(<138 copies/mL). A negative result must be combined with clinical observations, patient history, and epidemiological information. The expected result is Negative.  Fact Sheet for Patients:  EntrepreneurPulse.com.au  Fact Sheet for Healthcare Providers:  IncredibleEmployment.be  This test is no t yet approved or cleared by the [place] FDA and  has been authorized for detection and/or diagnosis of SARS-CoV-2 by FDA under an Emergency Use Authorization (EUA). This EUA will remain  in effect (meaning this test can be used) for the duration of the COVID-19 declaration under Section 564(b)(1) of the Act, 21 U.S.C.section 360bbb-3(b)(1), unless the authorization is terminated  or  revoked sooner.       Influenza A by PCR NEGATIVE NEGATIVE Final   Influenza B by PCR NEGATIVE NEGATIVE Final    Comment: (NOTE) The Xpert Xpress SARS-CoV-2/FLU/RSV plus assay is intended as an aid in the diagnosis of influenza from Nasopharyngeal swab specimens and should not be used as a  sole basis for treatment. Nasal washings and aspirates are unacceptable for Xpert Xpress SARS-CoV-2/FLU/RSV testing.  Fact Sheet for Patients: EntrepreneurPulse.com.au  Fact Sheet for Healthcare Providers: IncredibleEmployment.be  This test is not yet approved or cleared by the [place] FDA and has been authorized for detection and/or diagnosis of SARS-CoV-2 by FDA under an Emergency Use Authorization (EUA). This EUA will remain in effect (meaning this test can be used) for the duration of the COVID-19 declaration under Section 564(b)(1) of the Act, 21 U.S.C. section 360bbb-3(b)(1), unless the authorization is terminated or revoked.  Performed at [facility], [place] [address] [place], [place] [address]      Labs: BNP (last 3 results) No results for input(s): BNP in the last 8760 hours. Basic Metabolic Panel: Recent Labs  Lab 10/27/21 0223 10/28/21 0256 10/29/21 0237 10/30/21 1235 11/01/21 0450 11/02/21 0238  NA 132* 134* 135  --  132*  --   K 3.9 4.3 3.2* 3.2* 3.2* 3.9  CL 99 102 99  --  102  --   CO2 21* 22 24  --  21*  --   GLUCOSE 100* 133* 100*  --  98  --   BUN 19 21 18   --  13  --   CREATININE 1.95* 1.94* 1.80*  --  1.68*  --   CALCIUM 8.5* 8.0* 8.3*  --  7.7*  --   MG 1.7  --   --   --   --   --   PHOS 2.9  --   --   --   --   --    Liver Function Tests: No results for input(s): AST, ALT, ALKPHOS, BILITOT, PROT, ALBUMIN in the last 168 hours. No results for input(s): LIPASE, AMYLASE in the last 168 hours. No results for input(s): AMMONIA in the last 168 hours. CBC: Recent Labs  Lab 10/27/21 0223 10/28/21 0256  10/29/21 0237 10/30/21 1235  WBC 15.5* 18.6* 22.5* 14.4*  HGB 10.3* 8.4* 8.8* 8.6*  HCT 30.5* 26.4* 26.4* 25.5*  MCV 101.0* 102.7* 103.1* 102.0*  PLT 596* 578* 660* 651*   Cardiac Enzymes: No results for input(s): CKTOTAL, CKMB, CKMBINDEX, TROPONINI in the last 168 hours. BNP: Invalid input(s): POCBNP CBG: Recent Labs  Lab 11/01/21 1931 11/02/21 0103 11/02/21 0332 11/02/21 0728 11/02/21 1135  GLUCAP 90 96 82 92 104*   D-Dimer No results for input(s): DDIMER in the last 72 hours. Hgb A1c No results for input(s): HGBA1C in the last 72 hours. Lipid Profile No results for input(s): CHOL, HDL, LDLCALC, TRIG, CHOLHDL, LDLDIRECT in the last 72 hours. Thyroid function studies No results for input(s): TSH, T4TOTAL, T3FREE, THYROIDAB in the last 72 hours.  Invalid input(s): FREET3 Anemia work up No results for input(s): VITAMINB12, FOLATE, FERRITIN, TIBC, IRON, RETICCTPCT in the last 72 hours. Urinalysis    Component Value Date/Time   COLORURINE YELLOW (A) 10/18/2021 1755   APPEARANCEUR HAZY (A) 10/18/2021 1755   APPEARANCEUR Clear 05/11/2015 1054   LABSPEC 1.012 10/18/2021 1755   LABSPEC 1.014 01/15/2014 1320   PHURINE 5.0 10/18/2021 1755   GLUCOSEU NEGATIVE 10/18/2021 1755   GLUCOSEU Negative 01/15/2014 1320   HGBUR MODERATE (A) 10/18/2021 1755   BILIRUBINUR NEGATIVE 10/18/2021 1755   BILIRUBINUR Negative 05/11/2015 1054   BILIRUBINUR Negative 01/15/2014 1320   KETONESUR NEGATIVE 10/18/2021 1755   PROTEINUR 100 (A) 10/18/2021 1755   UROBILINOGEN 1.0 11/29/2014 1534   NITRITE NEGATIVE 10/18/2021 1755   LEUKOCYTESUR NEGATIVE 10/18/2021 1755  LEUKOCYTESUR Negative 01/15/2014 1320   Sepsis Labs Invalid input(s): PROCALCITONIN,  WBC,  LACTICIDVEN Microbiology Recent Results (from the past 240 hour(s))  MRSA Next Gen by PCR, Nasal     Status: None   Collection Time: 10/27/21  6:39 AM   Specimen: Nasal Mucosa; Nasal Swab  Result Value Ref Range Status   MRSA by PCR  Next Gen NOT DETECTED NOT DETECTED Final    Comment: (NOTE) The GeneXpert MRSA Assay (FDA approved for NASAL specimens only), is one component of a comprehensive MRSA colonization surveillance program. It is not intended to diagnose MRSA infection nor to guide or monitor treatment for MRSA infections. Test performance is not FDA approved in patients less than 98 years old. Performed at [facility], [place] [address], [place], [place] [address]   Aerobic/Anaerobic Culture w Gram Stain (surgical/deep wound)     Status: None   Collection Time: 10/27/21  2:53 PM   Specimen: PATH Other; Tissue  Result Value Ref Range Status   Specimen Description TISSUE  Final   Special Requests SHOULDER 1 SPEC A  Final   Gram Stain   Final    FEW WBC PRESENT, PREDOMINANTLY MONONUCLEAR NO ORGANISMS SEEN    Culture   Final    No growth aerobically or anaerobically. Performed at [facility], [place] [address], [place], [place] [address]    Report Status 11/01/2021 FINAL  Final  Aerobic/Anaerobic Culture w Gram Stain (surgical/deep wound)     Status: None   Collection Time: 10/27/21  2:54 PM   Specimen: PATH Other; Tissue  Result Value Ref Range Status   Specimen Description TISSUE  Final   Special Requests SHOULDER 2 SPEC B  Final   Gram Stain   Final    FEW WBC PRESENT, PREDOMINANTLY MONONUCLEAR NO ORGANISMS SEEN    Culture   Final    No growth aerobically or anaerobically. Performed at [facility], [place] [address], [place], [place] [address]    Report Status 11/01/2021 FINAL  Final  Resp Panel by RT-PCR (Flu A&B, Covid) Nasopharyngeal Swab     Status: None   Collection Time: 11/02/21 10:17 AM   Specimen: Nasopharyngeal Swab; Nasopharyngeal(NP) swabs in vial transport medium  Result Value Ref Range Status   SARS Coronavirus 2 by RT PCR NEGATIVE NEGATIVE Final    Comment: (NOTE) SARS-CoV-2 target nucleic acids are NOT DETECTED.  The SARS-CoV-2 RNA is generally detectable in  upper respiratory specimens during the acute phase of infection. The lowest concentration of SARS-CoV-2 viral copies this assay can detect is 138 copies/mL. A negative result does not preclude SARS-Cov-2 infection and should not be used as the sole basis for treatment or other patient management decisions. A negative result may occur with  improper specimen collection/handling, submission of specimen other than nasopharyngeal swab, presence of viral mutation(s) within the areas targeted by this assay, and inadequate number of viral copies(<138 copies/mL). A negative result must be combined with clinical observations, patient history, and epidemiological information. The expected result is Negative.  Fact Sheet for Patients:  EntrepreneurPulse.com.au  Fact Sheet for Healthcare Providers:  IncredibleEmployment.be  This test is no t yet approved or cleared by the [place] FDA and  has been authorized for detection and/or diagnosis of SARS-CoV-2 by FDA under an Emergency Use Authorization (EUA). This EUA will remain  in effect (meaning this test can be used) for the duration of the COVID-19 declaration under Section 564(b)(1) of the Act, 21  U.S.C.section 360bbb-3(b)(1), unless the authorization is terminated  or revoked sooner.       Influenza A by PCR NEGATIVE NEGATIVE Final   Influenza B by PCR NEGATIVE NEGATIVE Final    Comment: (NOTE) The Xpert Xpress SARS-CoV-2/FLU/RSV plus assay is intended as an aid in the diagnosis of influenza from Nasopharyngeal swab specimens and should not be used as a sole basis for treatment. Nasal washings and aspirates are unacceptable for Xpert Xpress SARS-CoV-2/FLU/RSV testing.  Fact Sheet for Patients: EntrepreneurPulse.com.au  Fact Sheet for Healthcare Providers: IncredibleEmployment.be  This test is not yet approved or cleared by the [place] FDA and has been  authorized for detection and/or diagnosis of SARS-CoV-2 by FDA under an Emergency Use Authorization (EUA). This EUA will remain in effect (meaning this test can be used) for the duration of the COVID-19 declaration under Section 564(b)(1) of the Act, 21 U.S.C. section 360bbb-3(b)(1), unless the authorization is terminated or revoked.  Performed at [facility], [place] [address] [place], [place] [address]      Time coordinating discharge: Over 30 minutes  SIGNED:   Nolberto Hanlon, MD  [facility] 11/02/2021, 12:34 PM Pager   If 7PM-7AM, please contact night-coverage [email or web address] Password [email or web address]

## 2021-11-02 NOTE — TOC Progression Note (Signed)
Transition of Care [facility]) - Progression Note    Patient Details  Name: Edward Singleton MRN: [id number] Date of Birth: November 24, 1950  Transition of Care [facility]) CM/SW Contact  Edward Chars, LCSW Phone Number: 11/02/2021, 1:05 PM  Clinical Narrative:   CSW spoke with pt regarding offer at [facility] and pt would like to accept.  States he was there before and had a good experience.  CSW spoke with sister Edward Singleton who is also in favor of accepting this offer.  CSW spoke with Edward Singleton at [facility] and she will review the chart.  (Pt face sheet showed VA coverage and medicare A and B yesterday, but today the Medicare is no longer listed)  1205: CSW received call from Edward Singleton at [facility].  She was able to verify the medicare.  Can accept pt today.      Expected Discharge Plan: [place] (vs home with home health services) Barriers to Discharge: Barriers Resolved  Expected Discharge Plan and Services Expected Discharge Plan: [place] (vs home with home health services)   Discharge Planning Services: CM Consult   Living arrangements for the past 2 months: Single Family Home Expected Discharge Date: 11/02/21                           [facility] Agency: [place] Date [place]: 10/27/21 Time [place]: 1109 Representative spoke with at [place]: Edward Singleton ([place]) Interventions    Readmission Risk Interventions Readmission Risk Prevention Plan 03/15/2019  Transportation Screening Complete  PCP or Specialist Appt within 3-5 Days Complete  HRI or [place] Complete  Social Work Consult for [place] Planning/Counseling Complete  Palliative Care Screening Complete  Medication Review Press photographer) Complete  Some recent data might be hidden

## 2021-11-02 NOTE — Progress Notes (Signed)
° °  ORTHOPAEDIC PROGRESS NOTE  s/p Procedure(s): REVERSE SHOULDER ARTHROPLASTY on 1/18 with Dr. Griffin Basil  SUBJECTIVE: Patient more alert this morning. States his pain comes and goes. Prevena had to be replaced over the weekend.   OBJECTIVE: PE: General: sitting up in hospital bed, NAD RUE: Prevena wound vac in place with good seal - no drainage noted in the canister. Wound vac removed. Incision - clean, dry, intact. Aquacel placed.  Sling re-adjusted.  Unable to appreciate deltoid motor function this was noted prior to surgery. When testing axillary nerve sensation, states he feels "numb" on both RUE and LUE. He axillary nerve function was questionable prior to surgery. Continues to have erythema of the right upper arm which is stable in appearance to previous images - worse at the elbow. Swelling of left forearm and hand have improved.  + motor function AIN, PIN, ulnar distribution. Actively flexes and extends his wrist. He endorses sensation intact in medial, radial, and ulnar distributions. Well perfused digits.     Vitals:   11/01/21 1934 11/02/21 0730  BP: (!) 142/85 (!) 142/78  Pulse: 93 82  Resp: 14 18  Temp: 98.4 F (36.9 C) 98 F (36.7 C)  SpO2: 96% 94%   Cultures from right shoulder - no growth.   Post-op images: stable post-op images  ASSESSMENT: VERTIS SCHEIB is a 71 y.o. male POD#6  PLAN: Weightbearing: NWB RUE  -  NO ROM of right shoulder for 4 weeks.  - Okay for wrist/hand exercises.  Insicional and dressing care: Prevena removed. New Aquacel placed.  Orthopedic device(s): Sling Showering: Okay to shower with assistance. Keep dressing CDI.  VTE prophylaxis: per primary team, no orthopedic contraindications. Okay to resume aspirin Pain control: PRN pain medications, minimize narcotics as able. Will discontinue IV narcotics.  Follow - up plan: 2 weeks in office for suture removal Dispo: TBD. PT/OT recommending SNF. Patient okay for discharge from orthopedics  standpoint once cleared by medicine team and therapies.  Continue IV antibiotics with transition to amoxicillin at discharge as recommended by primary team. He is extremely high risk of infection, dislocation, loss of fixation and wound dehiscence.    Discharge medications printed and placed in patient's chart.   Contact information:  Weekdays 8-5 Dr. Ophelia Charter, Noemi Chapel PA-C, After hours and holidays please check [email or web address] for group call information for [facility]   Noemi Chapel, PA-C 11/02/2021

## 2021-11-02 NOTE — Progress Notes (Signed)
Physical Therapy Treatment Patient Details Name: Edward Singleton MRN: [id number] DOB: [birth year] Today's Date: 11/02/2021   History of Present Illness Pt is a 71 y/o M admitted on 10/27/21 after being found down in his house. Pt reports he had been drinking too much & fell. Pt was confused, hypothermic & hypotensive. Pt is being treated for septic 2/2 strep bacteremia & hypovolemic shoc, rhabdomylosis . Pt found to have closed comminuted R proximal humerus fx.  He was transferred to [facility] from [facility] on  11/17 pm and underwent reverse TSA on 11/18.  Wound VAC Placed on Rt UE.  PMH: chronic alcohol use, DM, alcoholic dementia, h/o pelvic fx, gout, HTN    PT Comments    Patient progressing well towards PT goals. Pt irritable upon arrival today not recalling therapist from yesterday or walking in the hallway. Tolerated gait training today without need for UE support; noted to have dyspnea on exertion requiring seated rest break. Some mild balance deficits noted. Pt with cognitive deficits relating to awareness, memory and safety putting pt at increased risk for falls. Plans to d/c to SNF today. Will follow if still in the hospital.    Recommendations for follow up therapy are one component of a multi-disciplinary discharge planning process, led by the attending physician.  Recommendations may be updated based on patient status, additional functional criteria and insurance authorization.  Follow Up Recommendations  Skilled nursing-short term rehab (<3 hours/day)     Assistance Recommended at Discharge Intermittent Supervision/Assistance  Patient can return home with the following A little help with walking and/or transfers;A little help with bathing/dressing/bathroom;Direct supervision/assist for financial management;Help with stairs or ramp for entrance;Assist for transportation;Direct supervision/assist for medications management;Assistance with cooking/housework   Equipment Recommendations  None  recommended by PT    Recommendations for Other Services       Precautions / Restrictions Precautions Precautions: Fall;Shoulder Type of Shoulder Precautions: ROM elbow, wrist, and hand.  Sling at all times, and no ROM of shoulder Shoulder Interventions: Shoulder sling/immobilizer;At all times Precaution Booklet Issued: No Precaution Comments: unable to recall shoulder precautions Required Braces or Orthoses: Sling Restrictions Weight Bearing Restrictions: Yes RUE Weight Bearing: Non weight bearing Other Position/Activity Restrictions: sling on at all times     Mobility  Bed Mobility               General bed mobility comments: Sitting EOB upon PT arrival.    Transfers Overall transfer level: Needs assistance Equipment used: None Transfers: Sit to/from Stand Sit to Stand: Min assist           General transfer comment: Min A to steady in standing, stood from EOB x1, from chair x1.    Ambulation/Gait Ambulation/Gait assistance: Min guard Gait Distance (Feet): 120 Feet (+ 100) Assistive device: None Gait Pattern/deviations: Step-through pattern, Decreased stride length, Wide base of support Gait velocity: decreased     General Gait Details: Slow, mostly steady gait without UE support; 1 seated rest break due to 2-3/4 DOE. VSS on RA.   Stairs             Wheelchair Mobility    Modified Rankin (Stroke Patients Only)       Balance Overall balance assessment: Needs assistance Sitting-balance support: Feet supported, No upper extremity supported Sitting balance-Leahy Scale: Good     Standing balance support: During functional activity Standing balance-Leahy Scale: Fair Standing balance comment: Requires UE support to assist donning shoes in standing, Min A.  Cognition Arousal/Alertness: Awake/alert Behavior During Therapy: WFL for tasks assessed/performed Overall Cognitive Status: Impaired/Different from  baseline Area of Impairment: Memory, Awareness, Safety/judgement                     Memory: Decreased short-term memory, Decreased recall of precautions   Safety/Judgement: Decreased awareness of deficits, Decreased awareness of safety Awareness: Emergent   General Comments: Pt irritable upon arrival; "why yall doing me like this?" Reports he is supposed to d/c today. Mood more positive at end of session. Does not recall this therapist despite seeing him yesterday and making a "date" with him for today.        Exercises      General Comments General comments (skin integrity, edema, etc.): Sling off upon PT arrival; donned prior to session.      Pertinent Vitals/Pain Pain Assessment Pain Assessment: Faces Faces Pain Scale: Hurts little more Pain Location: RUE with movement Pain Descriptors / Indicators: Discomfort Pain Intervention(s): Monitored during session, Repositioned    Home Living                          Prior Function            PT Goals (current goals can now be found in the care plan section) Progress towards PT goals: Progressing toward goals    Frequency    Min 3X/week      PT Plan Current plan remains appropriate    Co-evaluation              AM-PAC PT "6 Clicks" Mobility   Outcome Measure  Help needed turning from your back to your side while in a flat bed without using bedrails?: A Little Help needed moving from lying on your back to sitting on the side of a flat bed without using bedrails?: A Little Help needed moving to and from a bed to a chair (including a wheelchair)?: A Little Help needed standing up from a chair using your arms (e.g., wheelchair or bedside chair)?: A Little Help needed to walk in hospital room?: A Little Help needed climbing 3-5 steps with a railing? : A Little 6 Click Score: 18    End of Session Equipment Utilized During Treatment: Other (comment) (sling) Activity Tolerance: Patient  tolerated treatment well Patient left: in bed;with call bell/phone within reach;with bed alarm set (sitting EOB) Nurse Communication: Mobility status PT Visit Diagnosis: Unsteadiness on feet (R26.81);Muscle weakness (generalized) (M62.81);Difficulty in walking, not elsewhere classified (R26.2);Pain Pain - Right/Left: Right Pain - part of body: Shoulder     Time: 8466-5993 PT Time Calculation (min) (ACUTE ONLY): 15 min  Charges:  $Gait Training: 8-22 mins                     Marisa Severin, PT, DPT Acute Rehabilitation Services Pager [phone] Office [place] 11/02/2021, 1:05 PM

## 2021-11-02 NOTE — TOC Transition Note (Signed)
Transition of Care [facility]) - CM/SW Discharge Note   Patient Details  Name: Edward Singleton MRN: [id number] Date of Birth: 05-14-1951  Transition of Care [facility]) CM/SW Contact:  Joanne Chars, LCSW Phone Number: 11/02/2021, 1:02 PM   Clinical Narrative:   Pt discharging to [facility].  RN call report to [phone], ask for 700 hall RN.    Final next level of care: Skilled Nursing Facility Barriers to Discharge: Barriers Resolved   Patient Goals and CMS Choice     Choice offered to / list presented to : Patient  Discharge Placement              Patient chooses bed at:  ([facility]) Patient to be transferred to facility by: [facility] Name of family member notified: sister [name] Patient and family notified of of transfer: 11/02/21  Discharge Plan and Services   Discharge Planning Services: CM Consult                        [place]: [place] Date [place]: 10/27/21 Time [place]: 1109 Representative spoke with at [place]: [place] ([place]) Interventions     Readmission Risk Interventions Readmission Risk Prevention Plan 03/15/2019  Transportation Screening Complete  PCP or Specialist Appt within 3-5 Days Complete  HRI or [place] Complete  Social Work Consult for [place] Planning/Counseling Complete  Palliative Care Screening Complete  Medication Review Press photographer) Complete  Some recent data might be hidden

## 2021-11-02 NOTE — Progress Notes (Signed)
Mobility Specialist Criteria Algorithm Info.   11/02/21 1548  Mobility  Bed Position Semi-fowlers  Activity Ambulated with assistance in hallway;Dangled on edge of bed  Range of Motion/Exercises Active;All extremities  Level of Assistance Standby assist, set-up cues, supervision of patient - no hands on  Assistive Device None  RUE Weight Bearing NWB  Distance Ambulated (ft) 120 ft  Activity Response Tolerated well   Patient received in bed anxious for discharge. Eventually agreed to participate in mobility with encouragement from RN. Ambulated in hallway at supervision with steady gait. Tolerated ambulation well without complaint or incident. Was left dangling EOB with all needs met.   11/02/2021 4:06 PM  Edward Singleton, [place], [place]  ZWRKY:[phone] Office: [phone]

## 2021-11-04 MED FILL — Hydromorphone HCl Preservative Free (PF) Inj 2 MG/ML: INTRAMUSCULAR | Qty: 1 | Status: AC

## 2021-11-15 DIAGNOSIS — F419 Anxiety disorder, unspecified: Secondary | ICD-10-CM | POA: Insufficient documentation

## 2021-11-15 DIAGNOSIS — I4581 Long QT syndrome: Secondary | ICD-10-CM | POA: Insufficient documentation

## 2021-11-16 ENCOUNTER — Ambulatory Visit: Payer: No Typology Code available for payment source | Attending: Infectious Diseases | Admitting: Infectious Diseases

## 2021-11-16 ENCOUNTER — Other Ambulatory Visit: Payer: Self-pay

## 2021-11-16 ENCOUNTER — Encounter: Payer: Self-pay | Admitting: Infectious Diseases

## 2021-11-16 VITALS — BP 126/80 | HR 92 | Temp 97.8°F | Resp 20 | Ht 66.5 in | Wt 129.4 lb

## 2021-11-16 DIAGNOSIS — R7881 Bacteremia: Secondary | ICD-10-CM | POA: Diagnosis present

## 2021-11-16 DIAGNOSIS — B954 Other streptococcus as the cause of diseases classified elsewhere: Secondary | ICD-10-CM | POA: Diagnosis not present

## 2021-11-16 DIAGNOSIS — D649 Anemia, unspecified: Secondary | ICD-10-CM | POA: Insufficient documentation

## 2021-11-16 NOTE — Progress Notes (Signed)
NAME: Edward Singleton  DOB: 10-03-1951  MRN: [id number]  Date/Time: 11/16/2021 8:38 AM  Subjective:  Patient is here from skilled nursing facility.  His sister is also here. Edward Singleton is a 71 y.o. male is here for follow up after recent hospitalization.  Patient was seen at [facility] between 10/18/2021 until 10/26/2021 after a fall and found on the floor in his house.  He had a long history of alcohol use.  He was found to have a comminuted fracture of the right femur with extensive ecchymosis and edema of the right arm.  He was found to be hypotensive and hypothermic while in the hospital.  He also had blood culture positive for Streptococcus gallolyticus.  It was in 1 set.  He also had Staph epidermidis in the same blood culture.  A true infection versus contamination was questioned.  Repeat blood cultures from 10/20/2021 were negative.  was treated with IV ceftriaxone, followed by cefazolin for 2 weeks.[place] Kitchen  He was seen by orthopedics and he was transferred to [facility] on 10/26/2021 and underwent right reverse total shoulder arthroplasty, right shoulder open external fixation of the tuberosities, right shoulder open reduction total fixation of humeral shaft fracture by Dr. Griffin Basil on 10/27/2021.  He was discharged to skilled nursing facility on 11/02/2021.  He was discharged with a Foley catheter because of urinary retention.  A voiding trial was done and the nursing home and the Foley has been removed now.  Was supposed to have completed amoxicillin on 11/03/2021. He is here for follow-up Patient is currently taking Keflex which is going to finish on 11/15/2021.  This was given in the nursing home for cellulitis of the arm. 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   Diabetes mellitus without complication ([place])    Gout    Gout    Hypercholesteremia    Hypertension    Kidney stones    Left flank pain, chronic    Pelvic fracture ([place]) 11/17/2014    Past Surgical History:  Procedure Laterality Date   ESOPHAGOGASTRODUODENOSCOPY Left 03/18/2019   Procedure: ESOPHAGOGASTRODUODENOSCOPY (EGD);  Surgeon: Jonathon Bellows, MD;  Location: [facility] ENDOSCOPY;  Service: Gastroenterology;  Laterality: Left;   left renal stent placement  2013   LITHOTRIPSY     ORIF HUMERUS FRACTURE Left 11/18/2014   Procedure: OPEN REDUCTION INTERNAL FIXATION (ORIF) DISTAL HUMERUS FRACTURE;  Surgeon: Rozanna Box, MD;  Location: [facility];  Service: Orthopedics;  Laterality: Left;   REVERSE SHOULDER ARTHROPLASTY Right 10/27/2021   Procedure: REVERSE SHOULDER ARTHROPLASTY;  Surgeon: Hiram Gash, MD;  Location: [facility];  Service: Orthopedics;  Laterality: Right;   SPLENECTOMY      Social History   Socioeconomic History   Marital status: Single    Spouse name: Not on file   Number of children: Not on file   Years of education: Not on file   Highest education level: Not on file  Occupational History   Not on file  Tobacco Use   Smoking status: Every Day    Packs/day: 1.00    Years: 50.00    Pack years: 50.00  Types: Cigarettes   Smokeless tobacco: Former    Types: Nurse, children's Use: Never used  Substance and Sexual Activity   Alcohol use: Not Currently    Alcohol/week: 42.0 standard drinks    Types: 42 Cans of beer per week    Comment: quit june 4th   Drug use: No   Sexual activity: Not on file  Other Topics Concern   Not on file  Social History Narrative   Not on file   Social Determinants of Health   Financial Resource Strain: Not on file  Food Insecurity: Not on file  Transportation Needs: Not on file  Physical Activity: Not on file  Stress: Not on file  Social Connections: Not on file  Intimate Partner Violence:  Not on file    Family History  Problem Relation Age of Onset   Heart disease Mother    Heart disease Father    Diabetes Father    Nephrolithiasis Paternal Grandfather    Kidney disease Neg Hx    Prostate cancer Neg Hx    No Known Allergies I?crestor Percocet prn Aspirin Metformin Methocarbamol Senna Thiamine cephalexin Current Outpatient Medications  Medication Sig Dispense Refill   aspirin EC 81 MG EC tablet Take 1 tablet (81 mg total) by mouth daily. Swallow whole. 30 tablet 11   bisacodyl (DULCOLAX) 10 MG suppository Place 1 suppository (10 mg total) rectally daily as needed for moderate constipation. 12 suppository 0   folic acid (FOLVITE) 1 MG tablet Take 1 mg by mouth daily.     HYDROcodone-acetaminophen (NORCO) 5-325 MG tablet Take 1 tablet by mouth every 6 (six) hours as needed for severe pain. 30 tablet 0   metFORMIN (GLUCOPHAGE) 500 MG tablet Take 500 mg by mouth 2 (two) times daily.     methocarbamol (ROBAXIN) 500 MG tablet Take 1 tablet (500 mg total) by mouth every 6 (six) hours as needed for muscle spasms.     polyethylene glycol (MIRALAX / GLYCOLAX) 17 g packet Take 17 g by mouth 2 (two) times daily. 14 each 0   rosuvastatin (CRESTOR) 20 MG tablet Take 20 mg by mouth daily.     senna-docusate (SENOKOT-S) 8.6-50 MG tablet Take 2 tablets by mouth 2 (two) times daily.     thiamine (VITAMIN B-1) 100 MG tablet Take 100 mg by mouth daily.     No current facility-administered medications for this visit.     Abtx:  Anti-infectives (From admission, onward)    None       REVIEW OF SYSTEMS:  Const: negative fever, negative chills, has weight loss Eyes: negative diplopia or visual changes, negative eye pain ENT: negative coryza, negative sore throat Resp: negative cough, hemoptysis, dyspnea Cards: negative for chest pain, palpitations, lower extremity edema GU: negative for frequency, dysuria and hematuria, no urinary retention currently GI: Negative for  abdominal pain, diarrhea, bleeding, constipation Skin: negative for rash and pruritus Heme: negative for easy bruising and gum/nose bleeding MS: General weakness Neurolo:negative for headaches, dizziness, vertigo, memory problems  Psych: negative for feelings of anxiety, depression  Endocrine: negative for thyroid, diabetes Allergy/Immunology- negative for any medication or food allergies  Objective:  VITALS:  BP 126/80 (BP Location: Left Arm, Patient Position: Sitting, Cuff Size: Normal)    Pulse 92    Temp 97.8 F (36.6 C) (Oral)    Resp 20    Ht 5' 6.5" (1.689 m)    Wt 129 lb 6.4 oz (58.7 kg)  SpO2 94%    BMI 20.57 kg/m  PHYSICAL EXAM:  General: Alert, cooperative, no distress, appears stated age.  Pale Head: Normocephalic, without obvious abnormality, atraumatic. Eyes: Conjunctivae clear, anicteric sclerae. Pupils are equal ENT Nares normal. No drainage or sinus tenderness. Lips, mucosa, and tongue normal. No Thrush Neck: Supple, symmetrical, no adenopathy, thyroid: non tender no carotid bruit and no JVD. Back: No CVA tenderness. Lungs: Clear to auscultation bilaterally. No Wheezing or Rhonchi. No rales. Heart: Regular rate and rhythm, no murmur, rub or gallop. Abdomen: Soft,  Extremities: Right arm in sling Swelling and bruising and ecchymosis is completely resolved Surgical site looks  fine with no erythema or discharge sutures still present     Skin: No rashes or lesions. Or bruising Lymph: Cervical, supraclavicular normal. Neurologic: Grossly non-focal Pertinent Labs none  ? Impression/Recommendation Recent streptococcal gallolyticus bacteremia.  Staph epidermidis also was in the 1 set of blood culture .contamination was questioned.  Treated with 2 weeks of IV antibiotics and currently on p.o. antibiotics which he will finish today No further antibiotics or work-up needed   Recent fracture of the femur and underwent reverse right shoulder arthroplasty and  ORIF Patient to follow-up with Ortho for suture removal. ? __Anemia stable Would recommend colonoscopy.  Patient follows at [facility].  History of alcohol abuse.  None since the past month.  _________________________________________________ Discussed with patient, and sister in detail.  Follow-up as needed.  Note:  This document was prepared using Dragon voice recognition software and may include unintentional dictation errors.

## 2021-11-26 DIAGNOSIS — Z7982 Long term (current) use of aspirin: Secondary | ICD-10-CM | POA: Insufficient documentation

## 2022-04-22 ENCOUNTER — Emergency Department: Payer: No Typology Code available for payment source

## 2022-04-22 ENCOUNTER — Emergency Department
Admission: EM | Admit: 2022-04-22 | Discharge: 2022-04-23 | Disposition: A | Discharge status: Home / Self Care | Payer: No Typology Code available for payment source | Attending: Emergency Medicine | Admitting: Emergency Medicine

## 2022-04-22 DIAGNOSIS — X58XXXA Exposure to other specified factors, initial encounter: Secondary | ICD-10-CM | POA: Diagnosis not present

## 2022-04-22 DIAGNOSIS — M25511 Pain in right shoulder: Secondary | ICD-10-CM | POA: Insufficient documentation

## 2022-04-22 DIAGNOSIS — E119 Type 2 diabetes mellitus without complications: Secondary | ICD-10-CM | POA: Insufficient documentation

## 2022-04-22 DIAGNOSIS — R0602 Shortness of breath: Secondary | ICD-10-CM | POA: Insufficient documentation

## 2022-04-22 DIAGNOSIS — I10 Essential (primary) hypertension: Secondary | ICD-10-CM | POA: Insufficient documentation

## 2022-04-22 DIAGNOSIS — F0390 Unspecified dementia without behavioral disturbance: Secondary | ICD-10-CM | POA: Insufficient documentation

## 2022-04-22 DIAGNOSIS — Y9389 Activity, other specified: Secondary | ICD-10-CM | POA: Diagnosis not present

## 2022-04-22 MED ORDER — FENTANYL CITRATE PF 50 MCG/ML IJ SOSY
50.0000 ug | PREFILLED_SYRINGE | Freq: Once | INTRAMUSCULAR | Status: DC
  Filled 2022-04-22: qty 1

## 2022-04-22 MED ORDER — FENTANYL CITRATE PF 50 MCG/ML IJ SOSY
50.0000 ug | PREFILLED_SYRINGE | Freq: Once | INTRAMUSCULAR | Status: AC
  Administered 2022-04-22: 50 ug via INTRAMUSCULAR

## 2022-04-22 NOTE — ED Triage Notes (Signed)
Pt had a full reverse right shoulder replacement in Jan 2023. States he picked up a watermelon this evening and felt intense pain of the right shoulder and now has limited mobility. Strong right radial pulse. Denies any numbness or tingling.

## 2022-04-22 NOTE — ED Provider Notes (Signed)
[facility] Provider Note    Event Date/Time   First MD Initiated Contact with Patient 04/22/22 2123     (approximate)   History   Shoulder Pain   HPI  Edward Singleton is a 71 y.o. male presents to the emergency department for treatment and evaluation of right shoulder pain.  Patient has a longstanding surgical history of fracture and revision with the most recent surgery being January 2023 where he had a reverse shoulder arthroplasty.  Today, he was carrying a watermelon and went to turn to shut the door and developed severe pain in the right shoulder.  He has not been able to raise his arm since the injury.  Past Medical History:  Diagnosis Date   Alcohol abuse    Atrophic kidney    Benign enlargement of prostate    Dementia ([place])    due to alcohol   Diabetes mellitus without complication ([facility])    Gout    Gout    Hypercholesteremia    Hypertension    Kidney stones    Left flank pain, chronic    Pelvic fracture ([place]) 11/17/2014     Physical Exam   Triage Vital Signs: ED Triage Vitals  Enc Vitals Group     BP 04/22/22 1955 (!) 190/93     Pulse Rate 04/22/22 1955 (!) 102     Resp 04/22/22 1955 18     Temp 04/22/22 1955 (!) 97.5 F (36.4 C)     Temp Source 04/22/22 1955 Oral     SpO2 04/22/22 1955 95 %     Weight --      Height --      Head Circumference --      [facility] --      Pain Score 04/22/22 1956 10     Pain Loc --      Pain Edu? --      Excl. in [place]? --     Most recent vital signs: Vitals:   04/22/22 1955  BP: (!) 190/93  Pulse: (!) 102  Resp: 18  Temp: (!) 97.5 F (36.4 C)  SpO2: 95%    General: Awake, no distress.  CV:  Good peripheral perfusion.  Resp:  Normal effort.  Abd:  No distention.  Other:  Step off of right shoulder, which may be baseline.   ED Results / Procedures / Treatments   Labs (all labs ordered are listed, but only abnormal results are displayed) Labs Reviewed  COMPREHENSIVE METABOLIC  PANEL  ETHANOL  CBC WITH DIFFERENTIAL/PLATELET  TROPONIN I (HIGH SENSITIVITY)     EKG  Pending.   RADIOLOGY  Image of the right shoulder shows interval humeral stem loosening after reverse shoulder arthroplasty.  I have independently reviewed and interpreted imaging as well as reviewed report from radiology.  PROCEDURES:  Critical Care performed: No  Procedures   MEDICATIONS ORDERED IN ED:  Medications  fentaNYL (SUBLIMAZE) injection 50 mcg (50 mcg Intramuscular Given 04/22/22 2256)     IMPRESSION / MDM / ASSESSMENT AND PLAN / ED COURSE   I reviewed the triage vital signs and the nursing notes.  Differential diagnosis includes, but is not limited to: Shoulder dislocation, hardware displacement, shoulder strain  Patient's presentation is most consistent with acute complicated illness / injury requiring diagnostic workup.  71 year old male presenting to the emergency department for treatment and evaluation of right shoulder pain.  See HPI for further details.  Image does show some concern for interval humeral  stem loosening.  Results discussed with the patient.  He has not been told that this has been happening prior to now.  Patient requesting pain medication.  Sister who is his medical power of attorney states that he has a history of addiction and would prefer to avoid any type of narcotic.  Patient disagrees and states that he must have something due to the amount of pain.  Patient is alert and oriented at this time and has capacity to make medical decisions.  Fentanyl 50 mcg IM ordered.  Case discussed with Dr. Sabra Heck who was able to review the images.  Recommendation for sling and follow-up with orthopedic surgeon who performed the initial surgery.  This was discussed with the patient and family.  Patient and sister who is power of attorney with the additional sister in the room who strongly disagree with patient being discharged.  They do not feel that he will be able  to take care of himself.  He lives alone and has no one that can be with him.  Initially, patient stated that he did not want to be admitted and would prefer to go home if he could have some type of pain control.  This was the initial plan until sister came out to advise that the patient was feeling short of breath.  Patient now states that he has had shortness of breath for the past 2 nights which has awakened him and he has had 2 sit up all night long.  He has had periods of intermittent chest pressure but had not wanted to tell anybody.  Patient now prefers admission.  Images and labs ordered.  Will handoff to oncoming shift for final disposition.      FINAL CLINICAL IMPRESSION(S) / ED DIAGNOSES   Final diagnoses:  Shortness of breath  Acute pain of right shoulder     Rx / DC Orders   ED Discharge Orders     None        Note:  This document was prepared using Dragon voice recognition software and may include unintentional dictation errors.   Victorino Dike, FNP 04/22/22 2354    Rada Hay, MD 04/22/22 2358

## 2022-04-23 LAB — COMPREHENSIVE METABOLIC PANEL
ALT: 11 U/L (ref 0–44)
AST: 20 U/L (ref 15–41)
Albumin: 4.5 g/dL (ref 3.5–5.0)
Alkaline Phosphatase: 123 U/L (ref 38–126)
Anion gap: 10 (ref 5–15)
BUN: 20 mg/dL (ref 8–23)
CO2: 24 mmol/L (ref 22–32)
Calcium: 9.6 mg/dL (ref 8.9–10.3)
Chloride: 101 mmol/L (ref 98–111)
Creatinine, Ser: 1.86 mg/dL — ABNORMAL HIGH (ref 0.61–1.24)
GFR, Estimated: 38 mL/min — ABNORMAL LOW (ref >60)
Glucose, Bld: 111 mg/dL — ABNORMAL HIGH (ref 70–99)
Potassium: 4.2 mmol/L (ref 3.5–5.1)
Sodium: 135 mmol/L (ref 135–145)
Total Bilirubin: 0.7 mg/dL (ref 0.3–1.2)
Total Protein: 7.9 g/dL (ref 6.5–8.1)

## 2022-04-23 LAB — CBC WITH DIFFERENTIAL/PLATELET
Abs Immature Granulocytes: 0.05 10*3/uL (ref 0.00–0.07)
Basophils Absolute: 0.1 10*3/uL (ref 0.0–0.1)
Basophils Relative: 1 %
Eosinophils Absolute: 0.3 10*3/uL (ref 0.0–0.5)
Eosinophils Relative: 2 %
HCT: 35.9 % — ABNORMAL LOW (ref 39.0–52.0)
Hemoglobin: 11.1 g/dL — ABNORMAL LOW (ref 13.0–17.0)
Immature Granulocytes: 0 %
Lymphocytes Relative: 23 %
Lymphs Abs: 3.5 10*3/uL (ref 0.7–4.0)
MCH: 28.3 pg (ref 26.0–34.0)
MCHC: 30.9 g/dL (ref 30.0–36.0)
MCV: 91.6 fL (ref 80.0–100.0)
Monocytes Absolute: 1.5 10*3/uL — ABNORMAL HIGH (ref 0.1–1.0)
Monocytes Relative: 10 %
Neutro Abs: 9.8 10*3/uL — ABNORMAL HIGH (ref 1.7–7.7)
Neutrophils Relative %: 64 %
Platelets: 523 10*3/uL — ABNORMAL HIGH (ref 150–400)
RBC: 3.92 MIL/uL — ABNORMAL LOW (ref 4.22–5.81)
RDW: 15.9 % — ABNORMAL HIGH (ref 11.5–15.5)
WBC: 15.2 10*3/uL — ABNORMAL HIGH (ref 4.0–10.5)
nRBC: 0 % (ref 0.0–0.2)

## 2022-04-23 LAB — TROPONIN I (HIGH SENSITIVITY): Troponin I (High Sensitivity): 18 ng/L — ABNORMAL HIGH (ref <18)

## 2022-04-23 LAB — ETHANOL: Alcohol, Ethyl (B): <10 mg/dL (ref <10)

## 2022-04-23 MED ORDER — OXYCODONE-ACETAMINOPHEN 5-325 MG PO TABS: 1.0000 | ORAL_TABLET | ORAL | 0 refills | Status: DC | PRN

## 2022-04-23 MED ORDER — OXYCODONE-ACETAMINOPHEN 5-325 MG PO TABS
1.0000 | ORAL_TABLET | Freq: Once | ORAL | Status: AC
  Administered 2022-04-23: 1 via ORAL
  Filled 2022-04-23: qty 1

## 2022-04-23 NOTE — ED Provider Notes (Signed)
-----------------------------------------   12:53 AM on 04/23/2022 ----------------------------------------- Care assumed from NP [place].  Patient had initially reported severe pain in his right shoulder after attempting to lift a watermelon.  Imaging showed possible loosening of hardware, which was discussed with Dr. Sabra Heck of orthopedics.  He recommends patient be placed in a sling and follow-up with his surgeon at [facility].  Family then reported that patient has been feeling slightly short of breath for the past couple of nights and additional work-up was initiated.  EKG shows no evidence of arrhythmia or ischemia, troponin very slightly elevated but this is likely secondary to his chronic kidney disease.  Creatinine is comparable to previous, mild leukocytosis noted but no symptoms to suggest infection with chest x-ray showing no signs of pneumonia.  On reassessment, patient is very well-appearing and breathing comfortably with oxygen saturations of 95% on room air.  I had long discussion with patient and family due to their concerns about his current living situation, especially given today's issues with his shoulder.  They are concerned he will have difficulty caring for himself at home given limited mobility in his right arm.  I offered observation here in the ED with plan for discussion with social work in the morning and potential home health, however patient and family declined.  They are requesting patient be discharged home and will follow-up with his orthopedic surgeon in [place].  He will be prescribed short course of pain medication, patient and family counseled to return to the ED for new or worsening symptoms.  ED ECG REPORT I, Blake Divine, the attending physician, personally viewed and interpreted this ECG.   Date: 04/23/2022  EKG Time: 23:36  Rate: 86  Rhythm: normal sinus rhythm  Axis: Normal  Intervals: Prolonged QT  ST&T Change: None      Blake Divine,  MD 04/23/22 [phone]

## 2022-04-28 ENCOUNTER — Other Ambulatory Visit: Payer: Self-pay | Admitting: Orthopaedic Surgery

## 2022-04-28 DIAGNOSIS — T84039A Mechanical loosening of unspecified internal prosthetic joint, initial encounter: Secondary | ICD-10-CM

## 2022-05-04 ENCOUNTER — Ambulatory Visit
Admission: RE | Admit: 2022-05-04 | Discharge: 2022-05-04 | Disposition: A | Discharge status: Home / Self Care | Payer: Medicare Other | Source: Ambulatory Visit | Attending: Orthopaedic Surgery | Admitting: Orthopaedic Surgery

## 2022-05-04 DIAGNOSIS — T84039A Mechanical loosening of unspecified internal prosthetic joint, initial encounter: Secondary | ICD-10-CM

## 2022-05-09 NOTE — Progress Notes (Signed)
Please place orders for PAT appointment scheduled 05/09/22.

## 2022-05-09 NOTE — Progress Notes (Addendum)
Interview completed over the phone with patient sister [name] who is POA. Patient has dementia r/t alcohol and is not able to provide a medical history well per [name]. When writer called and asked about appointment scheduled today at 49 sister was not aware of appointment and states she only knew about the surgery tomorrow. She is not able to get patient in today as she is at work and cannot get off. Patient lives alone and cannot drive. Labs will need to drawn DOS. [name] was able to answer most questions. Went over instructions verbally and emailed with instructions to call back with any questions. [name] verbalized understanding.  COVID Vaccine Completed:yes   Date of COVID positive in last 90 days: no  PCP - Windy Fast, MD Cardiologist - n/a  Chest x-ray - 04/22/22 Epic EKG - 04/22/22 Epic Stress Test - 10/23/15 Epic ECHO - 10/19/21 Epic Cardiac Cath - n/a Pacemaker/ICD device last checked: n/a Spinal Cord Stimulator: n/a  Bowel Prep - no  Sleep Study - n/a CPAP -   Fasting Blood Sugar - no checks at home per sister Checks Blood Sugar  times a day  Blood Thinner Instructions: Aspirin Instructions: ASA 81, no instructions per sister [name]) Last Dose:  Activity level: Can go up a flight of stairs and perform activities of daily living without stopping and without symptoms of chest pain. 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 Patient was also instructed that they will need to review over the PAT instructions again at home before surgery.

## 2022-05-09 NOTE — Patient Instructions (Signed)
SURGICAL WAITING ROOM VISITATION Patients having surgery or a procedure may have no more than 2 support people in the waiting area - these visitors may rotate.   Children under the age of 57 must have an adult with them who is not the patient. If the patient needs to stay at the hospital during part of their recovery, the visitor guidelines for inpatient rooms apply. Pre-op nurse will coordinate an appropriate time for 1 support person to accompany patient in pre-op.  This support person may not rotate.    Please refer to the [facility] website for the visitor guidelines for Inpatients (after your surgery is over and you are in a regular room).    Your procedure is scheduled on: 05/11/22   Report to [facility] Main Entrance    Report to admitting at 11:05 AM   Call this number if you have problems the morning of surgery [phone]   Do not eat food :After Midnight.   After Midnight you may have the following liquids until 10:35 AM DAY OF SURGERY  Water Non-Citrus Juices (without pulp, NO RED) Carbonated Beverages Black Coffee (NO MILK/CREAM OR CREAMERS, sugar ok)  Clear Tea (NO MILK/CREAM OR CREAMERS, sugar ok) regular and decaf                             Plain Jell-O (NO RED)                                           Fruit ices (not with fruit pulp, NO RED)                                     Popsicles (NO RED)                                                               Sports drinks like Gatorade (NO RED)                 The day of surgery:  Drink ONE (1) Pre-Surgery Clear Ensure or G2 at AM the morning of surgery. Drink in one sitting. Do not sip.  This drink was given to you during your hospital  pre-op appointment visit. Nothing else to drink after completing the  Pre-Surgery Clear Ensure or G2.          If you have questions, please contact your surgeon's office.   FOLLOW BOWEL PREP AND ANY ADDITIONAL PRE OP INSTRUCTIONS YOU RECEIVED FROM YOUR SURGEON'S  OFFICE!!!     Oral Hygiene is also important to reduce your risk of infection.                                    Remember - BRUSH YOUR TEETH THE MORNING OF SURGERY WITH YOUR REGULAR TOOTHPASTE   Do NOT smoke after Midnight   Take these medicines the morning of surgery with A SIP OF WATER: Doxycycline, Rosuvastatin  DO NOT TAKE ANY ORAL DIABETIC MEDICATIONS  DAY OF YOUR SURGERY  How to Manage Your Diabetes Before and After Surgery  Why is it important to control my blood sugar before and after surgery? Improving blood sugar levels before and after surgery helps healing and can limit problems. A way of improving blood sugar control is eating a healthy diet by:  Eating less sugar and carbohydrates  Increasing activity/exercise  Talking with your doctor about reaching your blood sugar goals High blood sugars (greater than 180 mg/dL) can raise your risk of infections and slow your recovery, so you will need to focus on controlling your diabetes during the weeks before surgery. Make sure that the doctor who takes care of your diabetes knows about your planned surgery including the date and location.  How do I manage my blood sugar before surgery? Check your blood sugar at least 4 times a day, starting 2 days before surgery, to make sure that the level is not too high or low. Check your blood sugar the morning of your surgery when you wake up and every 2 hours until you get to the Short Stay unit. If your blood sugar is less than 70 mg/dL, you will need to treat for low blood sugar: Do not take insulin. Treat a low blood sugar (less than 70 mg/dL) with  cup of clear juice (cranberry or apple), 4 glucose tablets, OR glucose gel. Recheck blood sugar in 15 minutes after treatment (to make sure it is greater than 70 mg/dL). If your blood sugar is not greater than 70 mg/dL on recheck, call [phone] for further instructions. Report your blood sugar to the short stay nurse when you get to Short  Stay.  If you are admitted to the hospital after surgery: Your blood sugar will be checked by the staff and you will probably be given insulin after surgery (instead of oral diabetes medicines) to make sure you have good blood sugar levels. The goal for blood sugar control after surgery is 80-180 mg/dL.   WHAT DO I DO ABOUT MY DIABETES MEDICATION?  Do not take oral diabetes medicines (pills) the morning of surgery.  THE DAY BEFORE SURGERY, take Metformin as prescribed.      THE MORNING OF SURGERY, do not take Metformin as prescribed.  Reviewed and Endorsed by [facility] Patient Education Committee, August 2015   Bring CPAP mask and tubing day of surgery.                              You may not have any metal on your body including jewelry, and body piercing             Do not wear lotions, powders, cologne, or deodorant              Men may shave face and neck.   Do not bring valuables to the hospital. [facility].   Contacts, dentures or bridgework may not be worn into surgery.   Bring small overnight bag day of surgery.   DO NOT [place]. PHARMACY WILL DISPENSE MEDICATIONS LISTED ON YOUR MEDICATION LIST TO YOU DURING YOUR ADMISSION [place]!    Special Instructions: Bring a copy of your healthcare power of attorney and living will documents         the day of surgery if you  haven't scanned them before.              Please read over the following fact sheets you were given: IF YOU HAVE QUESTIONS ABOUT YOUR PRE-OP INSTRUCTIONS PLEASE CALL [facility] - Preparing for Surgery Before surgery, you can play an important role.  Because skin is not sterile, your skin needs to be as free of germs as possible.  You can reduce the number of germs on your skin by washing with CHG (chlorahexidine gluconate) soap before surgery.  CHG is an antiseptic cleaner which kills germs  and bonds with the skin to continue killing germs even after washing. Please DO NOT use if you have an allergy to CHG or antibacterial soaps.  If your skin becomes reddened/irritated stop using the CHG and inform your nurse when you arrive at Short Stay. Do not shave (including legs and underarms) for at least 48 hours prior to the first CHG shower.  You may shave your face/neck.  Please follow these instructions carefully:  1.  Shower with CHG Soap the night before surgery and the  morning of surgery.  2.  If you choose to wash your hair, wash your hair first as usual with your normal  shampoo.  3.  After you shampoo, rinse your hair and body thoroughly to remove the shampoo.                             4.  Use CHG as you would any other liquid soap.  You can apply chg directly to the skin and wash.  Gently with a scrungie or clean washcloth.  5.  Apply the CHG Soap to your body ONLY FROM THE NECK DOWN.   Do   not use on face/ open                           Wound or open sores. Avoid contact with eyes, ears mouth and   genitals (private parts).                       Wash face,  Genitals (private parts) with your normal soap.             6.  Wash thoroughly, paying special attention to the area where your    surgery  will be performed.  7.  Thoroughly rinse your body with warm water from the neck down.  8.  DO NOT shower/wash with your normal soap after using and rinsing off the CHG Soap.                9.  Pat yourself dry with a clean towel.            10.  Wear clean pajamas.            11.  Place clean sheets on your bed the night of your first shower and do not  sleep with pets. Day of Surgery : Do not apply any lotions/deodorants the morning of surgery.  Please wear clean clothes to the hospital/surgery center.  FAILURE TO FOLLOW THESE INSTRUCTIONS MAY RESULT IN THE CANCELLATION OF YOUR SURGERY  PATIENT SIGNATURE_________________________________  NURSE  SIGNATURE__________________________________  ________________________________________________________________________  WHAT IS A BLOOD TRANSFUSION? Blood Transfusion Information  A transfusion is the replacement of blood or some of its parts. Blood is made up of multiple  cells which provide different functions. Red blood cells carry oxygen and are used for blood loss replacement. White blood cells fight against infection. Platelets control bleeding. Plasma helps clot blood. Other blood products are available for specialized needs, such as hemophilia or other clotting disorders. BEFORE THE TRANSFUSION  Who gives blood for transfusions?  Healthy volunteers who are fully evaluated to make sure their blood is safe. This is blood bank blood. Transfusion therapy is the safest it has ever been in the practice of medicine. Before blood is taken from a donor, a complete history is taken to make sure that person has no history of diseases nor engages in risky social behavior (examples are intravenous drug use or sexual activity with multiple partners). The donor's travel history is screened to minimize risk of transmitting infections, such as malaria. The donated blood is tested for signs of infectious diseases, such as HIV and hepatitis. The blood is then tested to be sure it is compatible with you in order to minimize the chance of a transfusion reaction. If you or a relative donates blood, this is often done in anticipation of surgery and is not appropriate for emergency situations. It takes many days to process the donated blood. RISKS AND COMPLICATIONS Although transfusion therapy is very safe and saves many lives, the main dangers of transfusion include:  Getting an infectious disease. Developing a transfusion reaction. This is an allergic reaction to something in the blood you were given. Every precaution is taken to prevent this. The decision to have a blood transfusion has been considered carefully  by your caregiver before blood is given. Blood is not given unless the benefits outweigh the risks. AFTER THE TRANSFUSION Right after receiving a blood transfusion, you will usually feel much better and more energetic. This is especially true if your red blood cells have gotten low (anemic). The transfusion raises the level of the red blood cells which carry oxygen, and this usually causes an energy increase. The nurse administering the transfusion will monitor you carefully for complications. HOME CARE INSTRUCTIONS  No special instructions are needed after a transfusion. You may find your energy is better. Speak with your caregiver about any limitations on activity for underlying diseases you may have. SEEK MEDICAL CARE IF:  Your condition is not improving after your transfusion. You develop redness or irritation at the intravenous (IV) site. SEEK IMMEDIATE MEDICAL CARE IF:  Any of the following symptoms occur over the next 12 hours: Shaking chills. You have a temperature by mouth above 102 F (38.9 C), not controlled by medicine. Chest, back, or muscle pain. People around you feel you are not acting correctly or are confused. Shortness of breath or difficulty breathing. Dizziness and fainting. You get a rash or develop hives. You have a decrease in urine output. Your urine turns a dark color or changes to pink, red, or brown. Any of the following symptoms occur over the next 10 days: You have a temperature by mouth above 102 F (38.9 C), not controlled by medicine. Shortness of breath. Weakness after normal activity. The white part of the eye turns yellow (jaundice). You have a decrease in the amount of urine or are urinating less often. Your urine turns a dark color or changes to pink, red, or brown. Document Released: 09/23/2000 Document Revised: 12/19/2011 Document Reviewed: 05/12/2008 [facility] Patient Information 2014 [place],  [place].  _______________________________________________________________________

## 2022-05-10 ENCOUNTER — Encounter (HOSPITAL_COMMUNITY)
Admission: RE | Admit: 2022-05-10 | Discharge: 2022-05-10 | Disposition: A | Discharge status: Home / Self Care | Payer: Medicare Other | Source: Ambulatory Visit | Attending: Orthopaedic Surgery | Admitting: Orthopaedic Surgery

## 2022-05-10 ENCOUNTER — Encounter (HOSPITAL_COMMUNITY): Payer: Self-pay

## 2022-05-10 DIAGNOSIS — I25118 Atherosclerotic heart disease of native coronary artery with other forms of angina pectoris: Secondary | ICD-10-CM

## 2022-05-10 DIAGNOSIS — E119 Type 2 diabetes mellitus without complications: Secondary | ICD-10-CM

## 2022-05-10 DIAGNOSIS — F1010 Alcohol abuse, uncomplicated: Secondary | ICD-10-CM

## 2022-05-10 DIAGNOSIS — Z01818 Encounter for other preprocedural examination: Secondary | ICD-10-CM

## 2022-05-10 HISTORY — DX: Headache, unspecified: R51.9

## 2022-05-10 HISTORY — DX: Pneumonia, unspecified organism: J18.9

## 2022-05-10 HISTORY — DX: Long QT syndrome: I45.81

## 2022-05-10 HISTORY — DX: White matter disease, unspecified: R90.82

## 2022-05-10 HISTORY — DX: Atherosclerotic heart disease of native coronary artery without angina pectoris: I25.10

## 2022-05-10 NOTE — H&P (Signed)
PREOPERATIVE H&P  Chief Complaint: INFECTED RIGHT TOTAL SHOULDER  HPI: Edward Singleton is a 71 y.o. male who presents for preoperative history and physical prior to scheduled surgery, Procedure(s): REVISION TOTAL SHOULDER TO REVERSE TOTAL SHOULDER.   Patient has a past medical history significant for alcohol abuse, diabetes, HTN, HLD.   Patient has a complex medical history. He was admitted to the [facility] for alcoholic ketosis, hypothermia, shock, and rhabdomyolysis in January 2023. Blood cultures positive for Streptococcus Galloyticus. He was found to have displaced right proximal humerus fracture. He was transferred to [facility] for operative management. See consult note from [place] on 10/27/21; "This patient has an extraordinarily high risk of complication.  His x-ray nerve function is questionable.  He has an extraordinarily comminuted fracture and will require longstem revision implants.  Additionally his overall health condition makes him at high risk for infection as this is overall trauma to this extremity.  We discussed his redness in his upper arm with Dr. Mack Guise who reports that it has been improving.  We feel that based on his overall picture it is unlikely to be a cellulitis and more likely to be related to his state found down on this extremity.  Additionally he has been on antibiotics for some time and will continue till the 25th.  We think that additional time waiting to have surgery would not be in the patient's best interest as it is risk of infection may not decrease however his overall morbidity will increase significantly waiting for additional days.  Dislocation and infection are his most likely risks as his fracture fixation failure.  We will attempt to do a reverse total shoulder arthroplasty however if he has instability events we may have to resect and go towards a hemiarthroplasty.  We discussed all this with his sister his power of attorney."  Patient initially did okay. He was noncompliant with restrictions and usage of his sling however. 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 His symptoms worsened two weeks ago. He was carrying a watermelon on July 14th with his right arm, he went to open the door with his left arm and felt a shock from his left shoulder and into his right.  After that he has had increasing pain in the right shoulder.  He went to the emergency room.  Workup showed some signs of periprosthetic loosening.  He was discharged home with pain medications.  His sister was upset that he was discharged with narcotics due to his recent history of alcohol abuse.  He was not drinking when this happened.  They tested his ethanol level in the hospital.  He is frustrated by his right shoulder and he feels like he is now unable to do things that he was before.  The pain is getting worse.  Symptoms are rated as moderate to severe, and have been worsening.  This is significantly impairing activities of daily living.    Please see clinic note for further details on this patient's care.    He has elected for surgical management.   Past Medical History:  Diagnosis Date   Alcohol abuse    Atrophic kidney    Benign enlargement of prostate    Coronary artery disease    Dementia ([place])    due to alcohol   Diabetes mellitus without complication ([place])    Gout    Gout    Headache    Hypercholesteremia    Hypertension  Kidney stones    Left flank pain, chronic    Pelvic fracture ([place]) 11/17/2014   Pneumonia    Prolonged QT syndrome    White matter disease    Past Surgical History:  Procedure Laterality Date   ESOPHAGOGASTRODUODENOSCOPY Left 03/18/2019   Procedure: ESOPHAGOGASTRODUODENOSCOPY (EGD);  Surgeon: Jonathon Bellows, MD;  Location: [facility] ENDOSCOPY;  Service: Gastroenterology;  Laterality: Left;   left renal stent placement  2013   LITHOTRIPSY     ORIF HUMERUS FRACTURE Left 11/18/2014   Procedure: OPEN  REDUCTION INTERNAL FIXATION (ORIF) DISTAL HUMERUS FRACTURE;  Surgeon: Rozanna Box, MD;  Location: [place];  Service: Orthopedics;  Laterality: Left;   REVERSE SHOULDER ARTHROPLASTY Right 10/27/2021   Procedure: REVERSE SHOULDER ARTHROPLASTY;  Surgeon: Hiram Gash, MD;  Location: [place];  Service: Orthopedics;  Laterality: Right;   SPLENECTOMY     Social History   Socioeconomic History   Marital status: Single    Spouse name: Not on file   Number of children: Not on file   Years of education: Not on file   Highest education level: Not on file  Occupational History   Not on file  Tobacco Use   Smoking status: Every Day    Packs/day: 1.00    Years: 50.00    Total pack years: 50.00    Types: Cigarettes   Smokeless tobacco: Former    Types: Nurse, children's Use: Never used  Substance and Sexual Activity   Alcohol use: Not Currently    Alcohol/week: 42.0 standard drinks of alcohol    Types: 42 Cans of beer per week    Comment: quit june 4th   Drug use: No   Sexual activity: Not on file  Other Topics Concern   Not on file  Social History Narrative   Not on file   Social Determinants of Health   Financial Resource Strain: Not on file  Food Insecurity: Not on file  Transportation Needs: Not on file  Physical Activity: Not on file  Stress: Not on file  Social Connections: Not on file   Family History  Problem Relation Age of Onset   Heart disease Mother    Heart disease Father    Diabetes Father    Nephrolithiasis Paternal Grandfather    Kidney disease Neg Hx    Prostate cancer Neg Hx    No Known Allergies Prior to Admission medications   Medication Sig Start Date End Date Taking? Authorizing Provider  aspirin EC 81 MG EC tablet Take 1 tablet (81 mg total) by mouth daily. Swallow whole. 11/03/21  Yes Nolberto Hanlon, MD  celecoxib (CELEBREX) 100 MG capsule Take 100 mg by mouth 2 (two) times daily. 04/27/22  Yes [provider]  folic acid (FOLVITE) 1  MG tablet Take 1 mg by mouth daily.   Yes [provider]  metFORMIN (GLUCOPHAGE) 500 MG tablet Take 500 mg by mouth 2 (two) times daily. 06/04/19  Yes [provider]  rosuvastatin (CRESTOR) 20 MG tablet Take 20 mg by mouth daily.   Yes [provider]  thiamine (VITAMIN B-1) 100 MG tablet Take 100 mg by mouth daily.   Yes [provider]  bisacodyl (DULCOLAX) 10 MG suppository Place 1 suppository (10 mg total) rectally daily as needed for moderate constipation. Patient not taking: Reported on 05/09/2022 11/02/21   Nolberto Hanlon, MD  doxycycline (VIBRA-TABS) 100 MG tablet Take 100 mg by mouth 2 (two) times daily. 04/27/22  [provider]  HYDROcodone-acetaminophen (NORCO) 5-325 MG tablet Take 1 tablet by mouth every 6 (six) hours as needed for severe pain. Patient not taking: Reported on 05/09/2022 11/02/21   Ethelda Chick, PA-C  methocarbamol (ROBAXIN) 500 MG tablet Take 1 tablet (500 mg total) by mouth every 6 (six) hours as needed for muscle spasms. Patient not taking: Reported on 05/09/2022 11/02/21   Nolberto Hanlon, MD  oxyCODONE-acetaminophen (PERCOCET) 5-325 MG tablet Take 1 tablet by mouth every 4 (four) hours as needed for severe pain. Patient not taking: Reported on 05/09/2022 04/23/22 04/23/23  Blake Divine, MD  polyethylene glycol (MIRALAX / GLYCOLAX) 17 g packet Take 17 g by mouth 2 (two) times daily. Patient not taking: Reported on 05/09/2022 11/02/21   Nolberto Hanlon, MD  senna-docusate (SENOKOT-S) 8.6-50 MG tablet Take 2 tablets by mouth 2 (two) times daily. Patient not taking: Reported on 05/09/2022 11/02/21   Nolberto Hanlon, MD    ROS: All other systems have been reviewed and were otherwise negative with the exception of those mentioned in the HPI and as above.  Physical Exam: General: Alert, no acute distress Cardiovascular: No pedal edema Respiratory: No cyanosis, no use of accessory musculature GI: No organomegaly, abdomen is soft and  non-tender Skin: No lesions in the area of chief complaint Neurologic: Sensation intact distally Psychiatric: Patient is competent for consent with normal mood and affect Lymphatic: No axillary or cervical lymphadenopathy  MUSCULOSKELETAL:  Axillary nerve does not appear to be firing.  His sensation is altered compared to the contralateral side.  Passive forward elevation to only 60 degrees.  Forward elevation and external rotation of 0 degrees.  Active motion only to about 30 degrees.  He has some redness and swelling just proximal to his antecubital fossa.  This is slightly tender.  Distal motor and sensory function are intact.   Imaging: CT of the right shoulder demonstrates humeral component loosening of the reverse right shoulder arthroplasty  BMI: There is no height or weight on file to calculate BMI.  Lab Results  Component Value Date   ALBUMIN 4.5 04/22/2022   Diabetes:   Patient has a diagnosis of diabetes,  Lab Results  Component Value Date   HGBA1C 6.2 (H) 10/19/2021   Smoking Status: Social History   Tobacco Use  Smoking Status Every Day   Packs/day: 1.00   Years: 50.00   Total pack years: 50.00   Types: Cigarettes  Smokeless Tobacco Former   Types: Chew   Ready to quit: Not Answered Counseling given: Not Answered  Infection of the implants, special circumstances     Assessment: INFECTED RIGHT TOTAL SHOULDER  Plan: Plan for Procedure(s): REVISION TOTAL SHOULDER TO REVERSE TOTAL SHOULDER possible hemiarthroplasty   This patient has an extraordinarily high risk of complication.   The risks benefits and alternatives were discussed with the patient including but not limited to the risks of nonoperative treatment, versus surgical intervention including infection, bleeding, nerve injury,  blood clots, cardiopulmonary complications, morbidity, mortality, among others, and they were willing to proceed.   We additionally specifically discussed risks of  axillary nerve injury, infection, periprosthetic fracture, dislocation, continued pain and longevity of implants prior to beginning procedure, need for additional surgery.   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   Patient will be admitted for inpatient treatment for surgery, pain control, OT, prophylactic antibiotics, VTE prophylaxis, and discharge planning. We will consult infectious disease. He  may need PICC line placement. The patient is planning to be discharged home with outpatient PT.   The patient acknowledged the explanation, agreed to proceed with the plan and consent was signed.   Operative Plan: Revision reverse total shoulder arthroplasty versus hemiarthroplasty Discharge Medications: Tylenol, Celebrex, Robaxin, Zofran DVT Prophylaxis: Aspirin Physical Therapy: Hold for now Special Discharge needs: Sling. [place], PA-C  05/10/2022 5:03 PM

## 2022-05-11 ENCOUNTER — Other Ambulatory Visit: Payer: Self-pay

## 2022-05-11 ENCOUNTER — Inpatient Hospital Stay (HOSPITAL_COMMUNITY)
Admission: RE | Admit: 2022-05-11 | Discharge: 2022-05-14 | DRG: 496 | Disposition: A | Discharge status: Skilled Nursing Facility | Payer: Medicare Other | Attending: Orthopaedic Surgery | Admitting: Orthopaedic Surgery

## 2022-05-11 ENCOUNTER — Inpatient Hospital Stay (HOSPITAL_COMMUNITY): Payer: Medicare Other

## 2022-05-11 ENCOUNTER — Inpatient Hospital Stay (HOSPITAL_COMMUNITY): Payer: Medicare Other | Admitting: Physician Assistant

## 2022-05-11 ENCOUNTER — Encounter (HOSPITAL_COMMUNITY)
Admission: RE | Disposition: A | Discharge status: Skilled Nursing Facility | Payer: Self-pay | Source: Home / Self Care | Attending: Orthopaedic Surgery

## 2022-05-11 ENCOUNTER — Encounter (HOSPITAL_COMMUNITY): Payer: Self-pay | Admitting: Orthopaedic Surgery

## 2022-05-11 ENCOUNTER — Inpatient Hospital Stay (HOSPITAL_COMMUNITY): Payer: Medicare Other | Admitting: Anesthesiology

## 2022-05-11 DIAGNOSIS — I10 Essential (primary) hypertension: Secondary | ICD-10-CM

## 2022-05-11 DIAGNOSIS — Z01818 Encounter for other preprocedural examination: Secondary | ICD-10-CM

## 2022-05-11 DIAGNOSIS — Z7982 Long term (current) use of aspirin: Secondary | ICD-10-CM

## 2022-05-11 DIAGNOSIS — T84038A Mechanical loosening of other internal prosthetic joint, initial encounter: Secondary | ICD-10-CM | POA: Diagnosis present

## 2022-05-11 DIAGNOSIS — I129 Hypertensive chronic kidney disease with stage 1 through stage 4 chronic kidney disease, or unspecified chronic kidney disease: Secondary | ICD-10-CM | POA: Diagnosis present

## 2022-05-11 DIAGNOSIS — Z9081 Acquired absence of spleen: Secondary | ICD-10-CM | POA: Diagnosis not present

## 2022-05-11 DIAGNOSIS — Z833 Family history of diabetes mellitus: Secondary | ICD-10-CM | POA: Diagnosis not present

## 2022-05-11 DIAGNOSIS — M109 Gout, unspecified: Secondary | ICD-10-CM | POA: Diagnosis present

## 2022-05-11 DIAGNOSIS — T8459XA Infection and inflammatory reaction due to other internal joint prosthesis, initial encounter: Secondary | ICD-10-CM

## 2022-05-11 DIAGNOSIS — E785 Hyperlipidemia, unspecified: Secondary | ICD-10-CM | POA: Diagnosis present

## 2022-05-11 DIAGNOSIS — F17210 Nicotine dependence, cigarettes, uncomplicated: Secondary | ICD-10-CM | POA: Diagnosis not present

## 2022-05-11 DIAGNOSIS — G5681 Other specified mononeuropathies of right upper limb: Secondary | ICD-10-CM | POA: Diagnosis present

## 2022-05-11 DIAGNOSIS — N1832 Chronic kidney disease, stage 3b: Secondary | ICD-10-CM | POA: Diagnosis not present

## 2022-05-11 DIAGNOSIS — M65111 Other infective (teno)synovitis, right shoulder: Secondary | ICD-10-CM | POA: Diagnosis present

## 2022-05-11 DIAGNOSIS — M00811 Arthritis due to other bacteria, right shoulder: Secondary | ICD-10-CM | POA: Diagnosis not present

## 2022-05-11 DIAGNOSIS — Z87442 Personal history of urinary calculi: Secondary | ICD-10-CM | POA: Diagnosis not present

## 2022-05-11 DIAGNOSIS — E1151 Type 2 diabetes mellitus with diabetic peripheral angiopathy without gangrene: Secondary | ICD-10-CM | POA: Diagnosis present

## 2022-05-11 DIAGNOSIS — R64 Cachexia: Secondary | ICD-10-CM | POA: Diagnosis present

## 2022-05-11 DIAGNOSIS — Z9889 Other specified postprocedural states: Secondary | ICD-10-CM | POA: Diagnosis not present

## 2022-05-11 DIAGNOSIS — Z6824 Body mass index (BMI) 24.0-24.9, adult: Secondary | ICD-10-CM | POA: Diagnosis not present

## 2022-05-11 DIAGNOSIS — Z8249 Family history of ischemic heart disease and other diseases of the circulatory system: Secondary | ICD-10-CM | POA: Diagnosis not present

## 2022-05-11 DIAGNOSIS — E1122 Type 2 diabetes mellitus with diabetic chronic kidney disease: Secondary | ICD-10-CM | POA: Diagnosis present

## 2022-05-11 DIAGNOSIS — Z7984 Long term (current) use of oral hypoglycemic drugs: Secondary | ICD-10-CM

## 2022-05-11 DIAGNOSIS — Z8619 Personal history of other infectious and parasitic diseases: Secondary | ICD-10-CM | POA: Diagnosis not present

## 2022-05-11 DIAGNOSIS — E119 Type 2 diabetes mellitus without complications: Secondary | ICD-10-CM

## 2022-05-11 DIAGNOSIS — Y792 Prosthetic and other implants, materials and accessory orthopedic devices associated with adverse incidents: Secondary | ICD-10-CM | POA: Diagnosis present

## 2022-05-11 DIAGNOSIS — Z791 Long term (current) use of non-steroidal anti-inflammatories (NSAID): Secondary | ICD-10-CM

## 2022-05-11 DIAGNOSIS — I2510 Atherosclerotic heart disease of native coronary artery without angina pectoris: Secondary | ICD-10-CM | POA: Diagnosis present

## 2022-05-11 DIAGNOSIS — M009 Pyogenic arthritis, unspecified: Secondary | ICD-10-CM | POA: Diagnosis not present

## 2022-05-11 DIAGNOSIS — F1010 Alcohol abuse, uncomplicated: Secondary | ICD-10-CM | POA: Diagnosis not present

## 2022-05-11 DIAGNOSIS — I739 Peripheral vascular disease, unspecified: Secondary | ICD-10-CM | POA: Diagnosis present

## 2022-05-11 DIAGNOSIS — E7800 Pure hypercholesterolemia, unspecified: Secondary | ICD-10-CM | POA: Diagnosis present

## 2022-05-11 DIAGNOSIS — Z79899 Other long term (current) drug therapy: Secondary | ICD-10-CM | POA: Diagnosis not present

## 2022-05-11 DIAGNOSIS — I150 Renovascular hypertension: Secondary | ICD-10-CM | POA: Diagnosis not present

## 2022-05-11 HISTORY — DX: Pyogenic arthritis, unspecified: M00.9

## 2022-05-11 HISTORY — PX: REVISION TOTAL SHOULDER TO REVERSE TOTAL SHOULDER: SHX6313

## 2022-05-11 LAB — COMPREHENSIVE METABOLIC PANEL
ALT: 11 U/L (ref 0–44)
AST: 18 U/L (ref 15–41)
Albumin: 3.4 g/dL — ABNORMAL LOW (ref 3.5–5.0)
Alkaline Phosphatase: 99 U/L (ref 38–126)
Anion gap: 10 (ref 5–15)
BUN: 28 mg/dL — ABNORMAL HIGH (ref 8–23)
CO2: 26 mmol/L (ref 22–32)
Calcium: 9 mg/dL (ref 8.9–10.3)
Chloride: 100 mmol/L (ref 98–111)
Creatinine, Ser: 2.21 mg/dL — ABNORMAL HIGH (ref 0.61–1.24)
GFR, Estimated: 31 mL/min — ABNORMAL LOW (ref >60)
Glucose, Bld: 233 mg/dL — ABNORMAL HIGH (ref 70–99)
Potassium: 4.6 mmol/L (ref 3.5–5.1)
Sodium: 136 mmol/L (ref 135–145)
Total Bilirubin: 0.6 mg/dL (ref 0.3–1.2)
Total Protein: 6.5 g/dL (ref 6.5–8.1)

## 2022-05-11 LAB — CBC
HCT: 31 % — ABNORMAL LOW (ref 39.0–52.0)
Hemoglobin: 9.8 g/dL — ABNORMAL LOW (ref 13.0–17.0)
MCH: 29.4 pg (ref 26.0–34.0)
MCHC: 31.6 g/dL (ref 30.0–36.0)
MCV: 93.1 fL (ref 80.0–100.0)
Platelets: 462 10*3/uL — ABNORMAL HIGH (ref 150–400)
RBC: 3.33 MIL/uL — ABNORMAL LOW (ref 4.22–5.81)
RDW: 16.2 % — ABNORMAL HIGH (ref 11.5–15.5)
WBC: 13.2 10*3/uL — ABNORMAL HIGH (ref 4.0–10.5)
nRBC: 0 % (ref 0.0–0.2)

## 2022-05-11 LAB — GLUCOSE, CAPILLARY
Glucose-Capillary: 95 mg/dL (ref 70–99)
Glucose-Capillary: 89 mg/dL (ref 70–99)
Glucose-Capillary: 194 mg/dL — ABNORMAL HIGH (ref 70–99)

## 2022-05-11 LAB — SEDIMENTATION RATE: Sed Rate: 13 mm/hr (ref 0–16)

## 2022-05-11 LAB — HEMOGLOBIN A1C
Hgb A1c MFr Bld: 6.1 % — ABNORMAL HIGH (ref 4.8–5.6)
Mean Plasma Glucose: 128.37 mg/dL

## 2022-05-11 LAB — C-REACTIVE PROTEIN: CRP: <0.5 mg/dL (ref <1.0)

## 2022-05-11 LAB — TYPE AND SCREEN
ABO/RH(D): A POS
Antibody Screen: NEGATIVE

## 2022-05-11 LAB — SURGICAL PCR SCREEN
MRSA, PCR: NEGATIVE
Staphylococcus aureus: NEGATIVE

## 2022-05-11 SURGERY — REVISION, REVERSE TOTAL ARTHROPLASTY, SHOULDER
Anesthesia: General | Site: Shoulder | Laterality: Right

## 2022-05-11 MED ORDER — ORAL CARE MOUTH RINSE
15.0000 mL | Freq: Once | OROMUCOSAL | Status: AC
Start: 2022-05-11 — End: 2022-05-11

## 2022-05-11 MED ORDER — CEFAZOLIN SODIUM-DEXTROSE 2-4 GM/100ML-% IV SOLN
2.0000 g | INTRAVENOUS | Status: AC
  Administered 2022-05-11: 2 g via INTRAVENOUS
  Filled 2022-05-11: qty 100

## 2022-05-11 MED ORDER — KETOROLAC TROMETHAMINE 30 MG/ML IJ SOLN
15.0000 mg | Freq: Once | INTRAMUSCULAR | Status: AC | PRN
  Administered 2022-05-11: 15 mg via INTRAVENOUS

## 2022-05-11 MED ORDER — METHOCARBAMOL 500 MG IVPB - SIMPLE MED
INTRAVENOUS | Status: AC
  Filled 2022-05-11: qty 55

## 2022-05-11 MED ORDER — BUPIVACAINE HCL (PF) 0.25 % IJ SOLN
INTRAMUSCULAR | Status: AC
  Filled 2022-05-11: qty 30

## 2022-05-11 MED ORDER — ENOXAPARIN SODIUM 30 MG/0.3ML IJ SOSY
30.0000 mg | PREFILLED_SYRINGE | INTRAMUSCULAR | Status: DC
Start: 2022-05-12 — End: 2022-05-14
  Administered 2022-05-12 – 2022-05-14 (×3): 30 mg via SUBCUTANEOUS
  Filled 2022-05-11 (×3): qty 0.3

## 2022-05-11 MED ORDER — GABAPENTIN 300 MG PO CAPS
300.0000 mg | ORAL_CAPSULE | Freq: Once | ORAL | Status: AC
  Administered 2022-05-11: 300 mg via ORAL
  Filled 2022-05-11: qty 1

## 2022-05-11 MED ORDER — LIDOCAINE HCL (PF) 2 % IJ SOLN
INTRAMUSCULAR | Status: AC
  Filled 2022-05-11: qty 5

## 2022-05-11 MED ORDER — SODIUM CHLORIDE 0.9 % IV SOLN
2.0000 g | INTRAVENOUS | Status: DC
  Administered 2022-05-11 – 2022-05-13 (×3): 2 g via INTRAVENOUS
  Filled 2022-05-11 (×3): qty 20

## 2022-05-11 MED ORDER — ONDANSETRON HCL 4 MG/2ML IJ SOLN: 4.0000 mg | Freq: Four times a day (QID) | INTRAMUSCULAR | Status: DC | PRN

## 2022-05-11 MED ORDER — METOCLOPRAMIDE HCL 5 MG PO TABS: 5.0000 mg | ORAL_TABLET | Freq: Three times a day (TID) | ORAL | Status: DC | PRN

## 2022-05-11 MED ORDER — FENTANYL CITRATE (PF) 100 MCG/2ML IJ SOLN
INTRAMUSCULAR | Status: AC
  Filled 2022-05-11: qty 2

## 2022-05-11 MED ORDER — ADULT MULTIVITAMIN W/MINERALS CH
1.0000 | ORAL_TABLET | Freq: Every day | ORAL | Status: DC
  Administered 2022-05-11 – 2022-05-14 (×4): 1 via ORAL
  Filled 2022-05-11 (×4): qty 1

## 2022-05-11 MED ORDER — CHLORHEXIDINE GLUCONATE 0.12 % MT SOLN
15.0000 mL | Freq: Once | OROMUCOSAL | Status: AC
  Administered 2022-05-11: 15 mL via OROMUCOSAL

## 2022-05-11 MED ORDER — HYDROMORPHONE HCL 1 MG/ML IJ SOLN
INTRAMUSCULAR | Status: AC
  Filled 2022-05-11: qty 1

## 2022-05-11 MED ORDER — METHOCARBAMOL 500 MG PO TABS
500.0000 mg | ORAL_TABLET | Freq: Four times a day (QID) | ORAL | Status: DC | PRN
  Administered 2022-05-12 – 2022-05-14 (×3): 500 mg via ORAL
  Filled 2022-05-11 (×4): qty 1

## 2022-05-11 MED ORDER — AMLODIPINE BESYLATE 5 MG PO TABS
5.0000 mg | ORAL_TABLET | Freq: Every day | ORAL | Status: DC
  Administered 2022-05-11: 5 mg via ORAL
  Filled 2022-05-11: qty 1

## 2022-05-11 MED ORDER — BISACODYL 5 MG PO TBEC: 5.0000 mg | DELAYED_RELEASE_TABLET | Freq: Every day | ORAL | Status: DC | PRN

## 2022-05-11 MED ORDER — ACETAMINOPHEN 500 MG PO TABS
1000.0000 mg | ORAL_TABLET | Freq: Once | ORAL | Status: AC
  Administered 2022-05-11: 1000 mg via ORAL
  Filled 2022-05-11: qty 2

## 2022-05-11 MED ORDER — ONDANSETRON HCL 4 MG/2ML IJ SOLN: 4.0000 mg | Freq: Once | INTRAMUSCULAR | Status: DC | PRN

## 2022-05-11 MED ORDER — OXYCODONE HCL 5 MG PO TABS
ORAL_TABLET | ORAL | Status: AC
  Filled 2022-05-11: qty 1

## 2022-05-11 MED ORDER — LORAZEPAM 2 MG/ML IJ SOLN: 1.0000 mg | INTRAMUSCULAR | Status: DC | PRN

## 2022-05-11 MED ORDER — ONDANSETRON HCL 4 MG/2ML IJ SOLN
INTRAMUSCULAR | Status: DC | PRN
  Administered 2022-05-11: 4 mg via INTRAVENOUS

## 2022-05-11 MED ORDER — DOCUSATE SODIUM 100 MG PO CAPS
100.0000 mg | ORAL_CAPSULE | Freq: Two times a day (BID) | ORAL | Status: DC
  Administered 2022-05-11 – 2022-05-14 (×5): 100 mg via ORAL
  Filled 2022-05-11 (×6): qty 1

## 2022-05-11 MED ORDER — TRAMADOL HCL 50 MG PO TABS
50.0000 mg | ORAL_TABLET | Freq: Four times a day (QID) | ORAL | Status: DC
  Administered 2022-05-11 – 2022-05-14 (×12): 50 mg via ORAL
  Filled 2022-05-11 (×12): qty 1

## 2022-05-11 MED ORDER — KETOROLAC TROMETHAMINE 15 MG/ML IJ SOLN
7.5000 mg | Freq: Four times a day (QID) | INTRAMUSCULAR | Status: AC
  Administered 2022-05-11 – 2022-05-12 (×3): 7.5 mg via INTRAVENOUS
  Filled 2022-05-11 (×3): qty 1

## 2022-05-11 MED ORDER — SUGAMMADEX SODIUM 200 MG/2ML IV SOLN
INTRAVENOUS | Status: DC | PRN
  Administered 2022-05-11: 200 mg via INTRAVENOUS

## 2022-05-11 MED ORDER — TOBRAMYCIN SULFATE 1.2 G IJ SOLR
INTRAMUSCULAR | Status: DC | PRN
  Administered 2022-05-11: 1.2 g via TOPICAL

## 2022-05-11 MED ORDER — CELECOXIB 200 MG PO CAPS
200.0000 mg | ORAL_CAPSULE | Freq: Two times a day (BID) | ORAL | Status: DC
Start: 2022-05-11 — End: 2022-05-14
  Administered 2022-05-12 – 2022-05-14 (×5): 200 mg via ORAL
  Filled 2022-05-11 (×6): qty 1

## 2022-05-11 MED ORDER — PHENYLEPHRINE HCL-NACL 20-0.9 MG/250ML-% IV SOLN
INTRAVENOUS | Status: AC
  Filled 2022-05-11: qty 250

## 2022-05-11 MED ORDER — BUPIVACAINE LIPOSOME 1.3 % IJ SUSP
INTRAMUSCULAR | Status: DC | PRN
  Administered 2022-05-11: 10 mL via PERINEURAL

## 2022-05-11 MED ORDER — METHOCARBAMOL 500 MG IVPB - SIMPLE MED
500.0000 mg | Freq: Four times a day (QID) | INTRAVENOUS | Status: DC | PRN
  Administered 2022-05-11: 500 mg via INTRAVENOUS

## 2022-05-11 MED ORDER — DEXAMETHASONE SODIUM PHOSPHATE 10 MG/ML IJ SOLN
INTRAMUSCULAR | Status: DC | PRN
  Administered 2022-05-11: 5 mg via INTRAVENOUS

## 2022-05-11 MED ORDER — FENTANYL CITRATE PF 50 MCG/ML IJ SOSY
50.0000 ug | PREFILLED_SYRINGE | INTRAMUSCULAR | Status: DC
  Administered 2022-05-11 (×2): 50 ug via INTRAVENOUS
  Filled 2022-05-11: qty 2

## 2022-05-11 MED ORDER — FENTANYL CITRATE (PF) 100 MCG/2ML IJ SOLN
INTRAMUSCULAR | Status: DC | PRN
  Administered 2022-05-11 (×2): 50 ug via INTRAVENOUS

## 2022-05-11 MED ORDER — OXYCODONE HCL 5 MG PO TABS
5.0000 mg | ORAL_TABLET | Freq: Once | ORAL | Status: AC | PRN
  Administered 2022-05-11: 5 mg via ORAL

## 2022-05-11 MED ORDER — KETOROLAC TROMETHAMINE 30 MG/ML IJ SOLN
INTRAMUSCULAR | Status: AC
  Filled 2022-05-11: qty 1

## 2022-05-11 MED ORDER — DIPHENHYDRAMINE HCL 12.5 MG/5ML PO ELIX: 12.5000 mg | ORAL_SOLUTION | ORAL | Status: DC | PRN

## 2022-05-11 MED ORDER — LIDOCAINE HCL (CARDIAC) PF 100 MG/5ML IV SOSY
PREFILLED_SYRINGE | INTRAVENOUS | Status: DC | PRN
  Administered 2022-05-11: 100 mg via INTRAVENOUS

## 2022-05-11 MED ORDER — 0.9 % SODIUM CHLORIDE (POUR BTL) OPTIME
TOPICAL | Status: DC | PRN
  Administered 2022-05-11: 1000 mL

## 2022-05-11 MED ORDER — FOLIC ACID 1 MG PO TABS
1.0000 mg | ORAL_TABLET | Freq: Every day | ORAL | Status: DC
  Administered 2022-05-12 – 2022-05-14 (×3): 1 mg via ORAL
  Filled 2022-05-11 (×3): qty 1

## 2022-05-11 MED ORDER — PROPOFOL 10 MG/ML IV BOLUS
INTRAVENOUS | Status: AC
  Filled 2022-05-11: qty 20

## 2022-05-11 MED ORDER — ACETAMINOPHEN 500 MG PO TABS
500.0000 mg | ORAL_TABLET | Freq: Four times a day (QID) | ORAL | Status: AC
  Administered 2022-05-11 – 2022-05-12 (×4): 500 mg via ORAL
  Filled 2022-05-11 (×4): qty 1

## 2022-05-11 MED ORDER — VANCOMYCIN HCL 1500 MG/300ML IV SOLN
1500.0000 mg | Freq: Once | INTRAVENOUS | Status: AC
  Administered 2022-05-11: 1500 mg via INTRAVENOUS
  Filled 2022-05-11 (×2): qty 300

## 2022-05-11 MED ORDER — KETOROLAC TROMETHAMINE 15 MG/ML IJ SOLN
INTRAMUSCULAR | Status: AC
  Filled 2022-05-11: qty 1

## 2022-05-11 MED ORDER — PHENYLEPHRINE HCL-NACL 20-0.9 MG/250ML-% IV SOLN
INTRAVENOUS | Status: DC | PRN
  Administered 2022-05-11: 50 ug/min via INTRAVENOUS

## 2022-05-11 MED ORDER — SODIUM CHLORIDE 0.9 % IR SOLN
Status: DC | PRN
  Administered 2022-05-11: 1000 mL

## 2022-05-11 MED ORDER — THIAMINE HCL 100 MG/ML IJ SOLN
100.0000 mg | Freq: Every day | INTRAMUSCULAR | Status: DC
Start: 2022-05-11 — End: 2022-05-14
  Filled 2022-05-11 (×2): qty 2

## 2022-05-11 MED ORDER — ONDANSETRON HCL 4 MG/2ML IJ SOLN
INTRAMUSCULAR | Status: AC
Start: 2022-05-11 — End: ?
  Filled 2022-05-11: qty 2

## 2022-05-11 MED ORDER — SENNOSIDES-DOCUSATE SODIUM 8.6-50 MG PO TABS: 1.0000 | ORAL_TABLET | Freq: Every evening | ORAL | Status: DC | PRN

## 2022-05-11 MED ORDER — THIAMINE HCL 100 MG PO TABS
100.0000 mg | ORAL_TABLET | Freq: Every day | ORAL | Status: DC
  Administered 2022-05-11 – 2022-05-14 (×4): 100 mg via ORAL
  Filled 2022-05-11 (×4): qty 1

## 2022-05-11 MED ORDER — FLEET ENEMA 7-19 GM/118ML RE ENEM: 1.0000 | ENEMA | Freq: Once | RECTAL | Status: DC | PRN

## 2022-05-11 MED ORDER — HYDROMORPHONE HCL 1 MG/ML IJ SOLN
0.2500 mg | INTRAMUSCULAR | Status: DC | PRN
  Administered 2022-05-11 (×2): 0.5 mg via INTRAVENOUS

## 2022-05-11 MED ORDER — LORAZEPAM 1 MG PO TABS: 1.0000 mg | ORAL_TABLET | ORAL | Status: DC | PRN

## 2022-05-11 MED ORDER — HYDROCODONE-ACETAMINOPHEN 7.5-325 MG PO TABS
1.0000 | ORAL_TABLET | ORAL | Status: DC | PRN
  Administered 2022-05-11 – 2022-05-14 (×8): 1 via ORAL
  Filled 2022-05-11 (×8): qty 1

## 2022-05-11 MED ORDER — HYDRALAZINE HCL 20 MG/ML IJ SOLN: 10.0000 mg | Freq: Three times a day (TID) | INTRAMUSCULAR | Status: DC | PRN

## 2022-05-11 MED ORDER — TOBRAMYCIN SULFATE 1.2 G IJ SOLR
INTRAMUSCULAR | Status: AC
  Filled 2022-05-11: qty 1.2

## 2022-05-11 MED ORDER — ROCURONIUM BROMIDE 10 MG/ML (PF) SYRINGE
PREFILLED_SYRINGE | INTRAVENOUS | Status: AC
  Filled 2022-05-11: qty 10

## 2022-05-11 MED ORDER — METOCLOPRAMIDE HCL 5 MG/ML IJ SOLN: 5.0000 mg | Freq: Three times a day (TID) | INTRAMUSCULAR | Status: DC | PRN

## 2022-05-11 MED ORDER — LACTATED RINGERS IV SOLN: INTRAVENOUS | Status: DC

## 2022-05-11 MED ORDER — TRANEXAMIC ACID-NACL 1000-0.7 MG/100ML-% IV SOLN
1000.0000 mg | INTRAVENOUS | Status: AC
  Administered 2022-05-11: 1000 mg via INTRAVENOUS
  Filled 2022-05-11: qty 100

## 2022-05-11 MED ORDER — VANCOMYCIN HCL IN DEXTROSE 1-5 GM/200ML-% IV SOLN: 1000.0000 mg | Freq: Two times a day (BID) | INTRAVENOUS | Status: DC

## 2022-05-11 MED ORDER — ROSUVASTATIN CALCIUM 20 MG PO TABS
20.0000 mg | ORAL_TABLET | Freq: Every day | ORAL | Status: DC
  Administered 2022-05-11: 20 mg via ORAL
  Filled 2022-05-11: qty 1

## 2022-05-11 MED ORDER — OXYCODONE HCL 5 MG/5ML PO SOLN: 5.0000 mg | Freq: Once | ORAL | Status: AC | PRN

## 2022-05-11 MED ORDER — ONDANSETRON HCL 4 MG PO TABS: 4.0000 mg | ORAL_TABLET | Freq: Four times a day (QID) | ORAL | Status: DC | PRN

## 2022-05-11 MED ORDER — PROPOFOL 10 MG/ML IV BOLUS
INTRAVENOUS | Status: DC | PRN
  Administered 2022-05-11: 150 mg via INTRAVENOUS

## 2022-05-11 MED ORDER — BUPIVACAINE HCL (PF) 0.5 % IJ SOLN
INTRAMUSCULAR | Status: DC | PRN
  Administered 2022-05-11: 15 mL via PERINEURAL

## 2022-05-11 MED ORDER — VANCOMYCIN HCL 1000 MG IV SOLR
INTRAVENOUS | Status: AC
  Filled 2022-05-11: qty 20

## 2022-05-11 MED ORDER — VANCOMYCIN HCL 1000 MG IV SOLR
INTRAVENOUS | Status: DC | PRN
  Administered 2022-05-11: 1000 mg via TOPICAL

## 2022-05-11 MED ORDER — ROCURONIUM BROMIDE 10 MG/ML (PF) SYRINGE
PREFILLED_SYRINGE | INTRAVENOUS | Status: DC | PRN
  Administered 2022-05-11: 50 mg via INTRAVENOUS
  Administered 2022-05-11: 20 mg via INTRAVENOUS

## 2022-05-11 MED ORDER — VANCOMYCIN HCL 500 MG/100ML IV SOLN
500.0000 mg | INTRAVENOUS | Status: DC
Start: 2022-05-13 — End: 2022-05-12

## 2022-05-11 MED ORDER — LACTATED RINGERS IV SOLN
INTRAVENOUS | Status: DC
Start: 2022-05-11 — End: 2022-05-14

## 2022-05-11 MED ORDER — DEXAMETHASONE SODIUM PHOSPHATE 10 MG/ML IJ SOLN
INTRAMUSCULAR | Status: AC
  Filled 2022-05-11: qty 1

## 2022-05-11 MED ORDER — MIDAZOLAM HCL 2 MG/2ML IJ SOLN
1.0000 mg | INTRAMUSCULAR | Status: DC
  Administered 2022-05-11: 1 mg via INTRAVENOUS
  Filled 2022-05-11: qty 2

## 2022-05-11 SURGICAL SUPPLY — 88 items
AID PSTN UNV HD RSTRNT DISP (MISCELLANEOUS) ×1
APL SKNCLS STERI-STRIP NONHPOA (GAUZE/BANDAGES/DRESSINGS)
AQUALIS EXTRACTION 127 (Miscellaneous) ×2 IMPLANT
BAG COUNTER SPONGE SURGICOUNT (BAG) ×2 IMPLANT
BAG SPNG CNTER NS LX DISP (BAG) ×1
BENZOIN TINCTURE PRP APPL 2/3 (GAUZE/BANDAGES/DRESSINGS) ×1 IMPLANT
BLADE SAW SAG 29X58X.64 (BLADE) IMPLANT
BLADE SAW SGTL 73X25 THK (BLADE) IMPLANT
BONE CEMENT GENTAMICIN (Cement) ×2 IMPLANT
BRUSH FEMORAL CANAL (MISCELLANEOUS) IMPLANT
CEMENT BONE GENTAMICIN 40 (Cement) IMPLANT
CLSR STERI-STRIP ANTIMIC 1/2X4 (GAUZE/BANDAGES/DRESSINGS) IMPLANT
COVER BACK TABLE 60X90IN (DRAPES) IMPLANT
COVER SURGICAL LIGHT HANDLE (MISCELLANEOUS) ×2 IMPLANT
DRAPE C-ARM 42X120 X-RAY (DRAPES) IMPLANT
DRAPE INCISE IOBAN 66X45 STRL (DRAPES) ×2 IMPLANT
DRAPE ORTHO SPLIT 77X108 STRL (DRAPES) ×4
DRAPE SHEET LG 3/4 BI-LAMINATE (DRAPES) ×1 IMPLANT
DRAPE SURG ORHT 6 SPLT 77X108 (DRAPES) ×2 IMPLANT
DRAPE TOP 10253 STERILE (DRAPES) IMPLANT
DRAPE U-SHAPE 47X51 STRL (DRAPES) ×2 IMPLANT
DRSG AQUACEL AG ADV 3.5X10 (GAUZE/BANDAGES/DRESSINGS) ×1 IMPLANT
DRSG MEPILEX BORDER 4X8 (GAUZE/BANDAGES/DRESSINGS) IMPLANT
DRSG PAD ABDOMINAL 8X10 ST (GAUZE/BANDAGES/DRESSINGS) ×1 IMPLANT
DURAPREP 26ML APPLICATOR (WOUND CARE) ×4 IMPLANT
ELECT BLADE 6.5 .24CM SHAFT (ELECTRODE) IMPLANT
ELECT NDL TIP 2.8 STRL (NEEDLE) ×1 IMPLANT
ELECT NEEDLE TIP 2.8 STRL (NEEDLE) ×2 IMPLANT
ELECT REM PT RETURN 15FT ADLT (MISCELLANEOUS) ×2 IMPLANT
EVACUATOR 1/8 PVC DRAIN (DRAIN) IMPLANT
FACESHIELD WRAPAROUND (MASK) ×6 IMPLANT
FACESHIELD WRAPAROUND OR TEAM (MASK) ×2 IMPLANT
GAUZE SPONGE 4X4 12PLY STRL (GAUZE/BANDAGES/DRESSINGS) ×1 IMPLANT
GAUZE XEROFORM 1X8 LF (GAUZE/BANDAGES/DRESSINGS) ×1 IMPLANT
GLOVE BIO SURGEON STRL SZ 6.5 (GLOVE) ×2 IMPLANT
GLOVE BIOGEL PI IND STRL 6.5 (GLOVE) ×1 IMPLANT
GLOVE BIOGEL PI INDICATOR 6.5 (GLOVE) ×1
GLOVE BIOGEL PI ORTHO PRO SZ8 (GLOVE) ×2
GLOVE PI ORTHO PRO STRL SZ8 (GLOVE) ×2 IMPLANT
GOWN STRL REUS W/ TWL LRG LVL3 (GOWN DISPOSABLE) ×2 IMPLANT
GOWN STRL REUS W/TWL LRG LVL3 (GOWN DISPOSABLE) ×4
HANDPIECE INTERPULSE COAX TIP (DISPOSABLE)
HEAD HUM AEQUALIS 54X4X23 (Head) ×1 IMPLANT
JET LAVAGE IRRISEPT WOUND (IRRIGATION / IRRIGATOR) ×2
KIT BASIN OR (CUSTOM PROCEDURE TRAY) ×2 IMPLANT
KIT STABILIZATION SHOULDER (MISCELLANEOUS) ×2 IMPLANT
KIT TURNOVER KIT A (KITS) ×1 IMPLANT
LAVAGE JET IRRISEPT WOUND (IRRIGATION / IRRIGATOR) IMPLANT
MANIFOLD NEPTUNE II (INSTRUMENTS) ×2 IMPLANT
NDL 1/2 CIR CATGUT .05X1.09 (NEEDLE) ×1 IMPLANT
NEEDLE 1/2 CIR CATGUT .05X1.09 (NEEDLE) ×2 IMPLANT
NS IRRIG 1000ML POUR BTL (IV SOLUTION) ×2 IMPLANT
OSTEOTOME AQUALIS EXTRACTN 127 (Miscellaneous) IMPLANT
PACK SHOULDER (CUSTOM PROCEDURE TRAY) ×2 IMPLANT
PAD ARMBOARD 7.5X6 YLW CONV (MISCELLANEOUS) ×4 IMPLANT
RESTRAINT HEAD UNIVERSAL NS (MISCELLANEOUS) ×1 IMPLANT
RETRIEVER SUT HEWSON (MISCELLANEOUS) IMPLANT
SET HNDPC FAN SPRY TIP SCT (DISPOSABLE) IMPLANT
SLING ARM FOAM STRAP LRG (SOFTGOODS) ×1 IMPLANT
SLING ARM FOAM STRAP MED (SOFTGOODS) IMPLANT
SLING ARM FOAM STRAP XLG (SOFTGOODS) IMPLANT
SLING ARM IMMOBILIZER LRG (SOFTGOODS) IMPLANT
SLING ARM IMMOBILIZER MED (SOFTGOODS) IMPLANT
SMARTMIX MINI TOWER (MISCELLANEOUS)
SPONGE T-LAP 4X18 ~~LOC~~+RFID (SPONGE) IMPLANT
STEM HUMERAL SZ4BX104 132.5D (Joint) ×1 IMPLANT
SUPPORT WRAP ARM LG (MISCELLANEOUS) ×2 IMPLANT
SUT ETHIBOND 2 OS 4 DA (SUTURE) ×2 IMPLANT
SUT ETHILON 2 0 PS N (SUTURE) ×2 IMPLANT
SUT FIBERWIRE #2 38 T-5 BLUE (SUTURE)
SUT FIBERWIRE #5 38 CONV NDL (SUTURE) ×4
SUT MNCRL AB 4-0 PS2 18 (SUTURE) IMPLANT
SUT PDS AB 1 CT  36 (SUTURE) ×2
SUT PDS AB 1 CT 36 (SUTURE) IMPLANT
SUT VIC AB 0 CT1 27 (SUTURE)
SUT VIC AB 0 CT1 27XBRD ANBCTR (SUTURE) ×1 IMPLANT
SUT VIC AB 2-0 CT1 27 (SUTURE)
SUT VIC AB 2-0 CT1 TAPERPNT 27 (SUTURE) ×1 IMPLANT
SUT VIC AB 3-0 SH 27 (SUTURE) ×2
SUT VIC AB 3-0 SH 27X BRD (SUTURE) ×1 IMPLANT
SUTURE FIBERWR #2 38 T-5 BLUE (SUTURE) IMPLANT
SUTURE FIBERWR #5 38 CONV NDL (SUTURE) IMPLANT
TOWEL OR 17X26 10 PK STRL BLUE (TOWEL DISPOSABLE) ×2 IMPLANT
TOWEL OR NON WOVEN STRL DISP B (DISPOSABLE) ×2 IMPLANT
TOWER CARTRIDGE SMART MIX (DISPOSABLE) ×1 IMPLANT
TOWER SMARTMIX MINI (MISCELLANEOUS) IMPLANT
TUBE SUCTION HIGH CAP CLEAR NV (SUCTIONS) ×2 IMPLANT
WATER STERILE IRR 1000ML POUR (IV SOLUTION) ×2 IMPLANT

## 2022-05-11 NOTE — Anesthesia Procedure Notes (Signed)
Procedure Name: Intubation Date/Time: 05/11/2022 1:56 PM  Performed by: Lind Covert, CRNAPre-anesthesia Checklist: Patient identified, Emergency Drugs available, Suction available, Patient being monitored and Timeout performed Patient Re-evaluated:Patient Re-evaluated prior to induction Oxygen Delivery Method: Circle system utilized Preoxygenation: Pre-oxygenation with 100% oxygen Induction Type: IV induction Ventilation: Mask ventilation without difficulty Laryngoscope Size: Mac and 4 Grade View: Grade I Tube type: Oral Tube size: 7.5 mm Number of attempts: 1 Airway Equipment and Method: Stylet Placement Confirmation: ETT inserted through vocal cords under direct vision, positive ETCO2 and breath sounds checked- equal and bilateral Secured at: 23 cm Tube secured with: Tape Dental Injury: Teeth and Oropharynx as per pre-operative assessment

## 2022-05-11 NOTE — Interval H&P Note (Signed)
All questions answered

## 2022-05-11 NOTE — Progress Notes (Signed)
Call to Dr. Kalman Shan re: patient's BP post block. Annie Main, CRNA at bedside with RN and patient. No new orders per Dr. Kalman Shan, appreciated the notification.

## 2022-05-11 NOTE — Anesthesia Preprocedure Evaluation (Addendum)
Anesthesia Evaluation  Patient identified by MRN, date of birth, ID band Patient awake    Reviewed: Allergy & Precautions, NPO status , Patient's Chart, lab work & pertinent test results  Airway Mallampati: II  TM Distance: >3 FB Neck ROM: Full    Dental no notable dental hx.    Pulmonary Current Smoker and Patient abstained from smoking.,    Pulmonary exam normal breath sounds clear to auscultation       Cardiovascular hypertension, Normal cardiovascular exam Rhythm:Regular Rate:Normal  Untreated HTN   Neuro/Psych Dementia negative neurological ROS     GI/Hepatic negative GI ROS, (+)     substance abuse  alcohol use,   Endo/Other  diabetes  Renal/GU negative Renal ROS  negative genitourinary   Musculoskeletal negative musculoskeletal ROS (+)   Abdominal   Peds negative pediatric ROS (+)  Hematology negative hematology ROS (+)   Anesthesia Other Findings   Reproductive/Obstetrics negative OB ROS                            Anesthesia Physical Anesthesia Plan  ASA: 3  Anesthesia Plan: General   Post-op Pain Management: Regional block*   Induction: Intravenous  PONV Risk Score and Plan: 1 and Ondansetron and Treatment may vary due to age or medical condition  Airway Management Planned: Oral ETT  Additional Equipment:   Intra-op Plan:   Post-operative Plan: Extubation in OR  Informed Consent: I have reviewed the patients History and Physical, chart, labs and discussed the procedure including the risks, benefits and alternatives for the proposed anesthesia with the patient or authorized representative who has indicated his/her understanding and acceptance.     Dental advisory given  Plan Discussed with: CRNA and Surgeon  Anesthesia Plan Comments:         Anesthesia Quick Evaluation

## 2022-05-11 NOTE — Progress Notes (Addendum)
Pharmacy Antibiotic Note  Edward Singleton is a 71 y.o. male admitted on 05/11/2022 with INFECTED RIGHT TOTAL SHOULDER.  Pharmacy has been consulted for vancomycin dosing. S/p explant, synovectomy and hemiarthroplasty with antibiotic coated cement Gross purulence was noted within the shoulder SCr 2.21 Plan: Vancomycin 1500 mg IV loading dose Vancomycin 500 mg IV q36hrs for est AUC 533.8 Css min 13.1 using SCr 2.21  F/u renal function, WBC, temp, culture data Ceftriaxone 2 gm IV q24 per MD  Height: '5\' 6"'$  (167.6 cm) Weight: 68.9 kg (152 lb) IBW/kg (Calculated) : 63.8  Temp (24hrs), Avg:97.4 F (36.3 C), Min:96.9 F (36.1 C), Max:98.2 F (36.8 C)  No results for input(s): "WBC", "CREATININE", "LATICACIDVEN", "VANCOTROUGH", "VANCOPEAK", "VANCORANDOM", "GENTTROUGH", "GENTPEAK", "GENTRANDOM", "TOBRATROUGH", "TOBRAPEAK", "TOBRARND", "AMIKACINPEAK", "AMIKACINTROU", "AMIKACIN" in the last 168 hours.  Estimated Creatinine Clearance: 33.3 mL/min (A) (by C-G formula based on SCr of 1.86 mg/dL (H)).    No Known Allergies  Antimicrobials this admission: 8/2 Vanc>> 8/2 CTX>> 8/2 cefazolin 2 gm x 1 preop 8/2 tobra & vanc powder in OR Dose adjustments this admission:  Microbiology results: 8/2 MRSA/MSSA neg 8/2 surgical/deep wound cx x3: sent  Thank you for allowing pharmacy to be a part of this patient's care.  Eudelia Bunch, Pharm.D 05/11/2022 8:22 PM

## 2022-05-11 NOTE — Anesthesia Procedure Notes (Signed)
Anesthesia Regional Block: Interscalene brachial plexus block   Pre-Anesthetic Checklist: , timeout performed,  Correct Patient, Correct Site, Correct Laterality,  Correct Procedure, Correct Position, site marked,  Risks and benefits discussed,  Surgical consent,  Pre-op evaluation,  At surgeon's request and post-op pain management  Laterality: Right  Prep: chloraprep       Needles:  Injection technique: Single-shot  Needle Type: Echogenic Stimulator Needle     Needle Length: 9cm      Additional Needles:   Procedures:,,,, ultrasound used (permanent image in chart),,     Nerve Stimulator or Paresthesia:  Response: 0.55 mA  Additional Responses:   Narrative:  Start time: 05/11/2022 12:50 PM End time: 05/11/2022 12:59 PM Injection made incrementally with aspirations every 5 mL.  Performed by: Personally  Anesthesiologist: Myrtie Soman, MD  Additional Notes: Patient tolerated the procedure well without complications

## 2022-05-11 NOTE — Anesthesia Procedure Notes (Signed)
Anesthesia Procedure Image    

## 2022-05-11 NOTE — Transfer of Care (Signed)
Immediate Anesthesia Transfer of Care Note  Patient: Edward Singleton  Procedure(s) Performed: REVISION TOTAL SHOULDER TO REVERSE TOTAL SHOULDER (Right: Shoulder)  Patient Location: PACU  Anesthesia Type:General  Level of Consciousness: sedated  Airway & Oxygen Therapy: Patient Spontanous Breathing and Patient connected to face mask oxygen  Post-op Assessment: Report given to RN and Post -op Vital signs reviewed and stable  Post vital signs: Reviewed and stable  Last Vitals:  Vitals Value Taken Time  BP    Temp    Pulse    Resp    SpO2      Last Pain:  Vitals:   05/11/22 1326  TempSrc:   PainSc: 1       Patients Stated Pain Goal: 1 (88/91/69 4503)  Complications: No notable events documented.

## 2022-05-11 NOTE — Consult Note (Signed)
Initial Consultation Note   Patient: Edward Singleton JOI:[id number] DOB: 01-13-1951 PCP: Windy Fast, MD DOA: 05/11/2022 DOS: the patient was seen and examined on 05/11/2022 Primary service: Hiram Gash, MD  Referring physician: Dr. Griffin Basil Reason for consult: DM, CKD, HTN  Assessment and Plan: Infected right total shoulder     - admitted to orthopedic service; started on abx, plan per them including pain management  HTN     - doesn't look like he's on anything chronically     - schedule norvasc     - PRN hydralazine  DM2     - A1c, DM diet, glucose checks     - his last A1c was from 10/19/21 and it showed 6.2; he's only on metformin at home and his last glucose from today was 89; right now hold on SSI; will trend glucose and if it starts to stay elevated, add SSI  CAD PAD     - continue home regimen  CKD3b     - at baseline, follow  Hx of EtOH abuse     - CIWA  HLD      - continue statin  [facility] will continue to follow the patient.  HPI: Edward Singleton is a 71 y.o. male with past medical history of  CAD, PAD, DM2. Presenting with right shoulder pain. Admitted to the orthopedic service for an infected right total shoulder. He has been followed outpatient and has been on abx for some time now. Conservative management was not successful as he has had increased limitation due to shoulder pain. He is now s/p removal of infected implants and right should hemiarthroplasty. He is unable to tell me much about his chronic conditions except that he does not take insulin for his DM. He reports his sister helps him with his medications. [facility] has been consulted for management of his chronic conditions.   Review of Systems: As mentioned in the history of present illness. All other systems reviewed and are negative. 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    Diabetes mellitus without complication ([place])    Gout    Gout    Headache    Hypercholesteremia    Hypertension    Kidney stones    Left flank pain, chronic    Pelvic fracture ([place]) 11/17/2014   Pneumonia    Prolonged QT syndrome    Septic arthritis of shoulder, right ([place]) 05/11/2022   White matter disease    Past Surgical History:  Procedure Laterality Date   ESOPHAGOGASTRODUODENOSCOPY Left 03/18/2019   Procedure: ESOPHAGOGASTRODUODENOSCOPY (EGD);  Surgeon: Jonathon Bellows, MD;  Location: [facility] ENDOSCOPY;  Service: Gastroenterology;  Laterality: Left;   left renal stent placement  2013   LITHOTRIPSY     ORIF HUMERUS FRACTURE Left 11/18/2014   Procedure: OPEN REDUCTION INTERNAL FIXATION (ORIF) DISTAL HUMERUS FRACTURE;  Surgeon: Rozanna Box, MD;  Location: [place];  Service: Orthopedics;  Laterality: Left;   REVERSE SHOULDER ARTHROPLASTY Right 10/27/2021   Procedure: REVERSE SHOULDER ARTHROPLASTY;  Surgeon: Hiram Gash, MD;  Location: [place];  Service: Orthopedics;  Laterality: Right;   SPLENECTOMY     Social History:  reports that he has been smoking cigarettes. He has a 50.00 pack-year smoking history. He has quit using smokeless tobacco.  His smokeless tobacco use included chew.  He reports that he does not currently use alcohol after a past usage of about 42.0 standard drinks of alcohol per week. He reports that he does not use drugs.  No Known Allergies  Family History  Problem Relation Age of Onset   Heart disease Mother    Heart disease Father    Diabetes Father    Nephrolithiasis Paternal Grandfather    Kidney disease Neg Hx    Prostate cancer Neg Hx     Prior to Admission medications   Medication Sig Start Date End Date Taking? Authorizing Provider  aspirin EC 81 MG EC tablet Take 1 tablet (81 mg total) by mouth daily. Swallow whole. 11/03/21  Yes Nolberto Hanlon, MD  metFORMIN (GLUCOPHAGE) 500 MG tablet Take 500 mg by mouth 2 (two) times daily. 06/04/19  Yes [provider]  rosuvastatin (CRESTOR) 20 MG tablet Take 20 mg by mouth daily.   Yes [provider]  thiamine (VITAMIN B-1) 100 MG tablet Take 100 mg by mouth daily.   Yes [provider]  bisacodyl (DULCOLAX) 10 MG suppository Place 1 suppository (10 mg total) rectally daily as needed for moderate constipation. Patient not taking: Reported on 05/09/2022 11/02/21   Nolberto Hanlon, MD  celecoxib (CELEBREX) 100 MG capsule Take 100 mg by mouth 2 (two) times daily. 04/27/22   [provider]  doxycycline (VIBRA-TABS) 100 MG tablet Take 100 mg by mouth 2 (two) times daily. 04/27/22   [provider]  folic acid (FOLVITE) 1 MG tablet Take 1 mg by mouth daily.    [provider]  HYDROcodone-acetaminophen (NORCO) 5-325 MG tablet Take 1 tablet by mouth every 6 (six) hours as needed for severe pain. Patient not taking: Reported on 05/09/2022 11/02/21   Ethelda Chick, PA-C  methocarbamol (ROBAXIN) 500 MG tablet Take 1 tablet (500 mg total) by mouth every 6 (six) hours as needed for muscle spasms. Patient not taking: Reported on 05/09/2022 11/02/21   Nolberto Hanlon, MD  oxyCODONE-acetaminophen (PERCOCET) 5-325 MG tablet Take 1 tablet by mouth every 4 (four) hours as needed for severe pain. Patient not taking: Reported on 05/09/2022 04/23/22 04/23/23  Blake Divine, MD  polyethylene glycol (MIRALAX / GLYCOLAX) 17 g packet Take 17 g by mouth 2 (two) times daily. Patient not taking: Reported on 05/09/2022 11/02/21   Nolberto Hanlon, MD  senna-docusate (SENOKOT-S) 8.6-50 MG tablet Take 2 tablets by mouth 2 (two) times daily. Patient not taking: Reported on 05/09/2022 11/02/21   Nolberto Hanlon, MD    Physical Exam: Vitals:   05/11/22 1645 05/11/22 1700 05/11/22 1715 05/11/22 1742  BP: (!) 147/85 (!) 159/88 (!) 154/95 (!) 184/92  Pulse: 68 70 65 74  Resp: '13 13 14 14  '$ Temp:  (!) 97.2 F (36.2 C)  97.7 F (36.5 C)  TempSrc:    Oral  SpO2: 99% 93% 97% 97%  Weight:      Height:        General: 71 y.o. male resting in bed in NAD Eyes: PERRL, normal sclera ENMT: Nares patent w/o discharge, orophaynx clear, dentition normal, ears w/o discharge/lesions/ulcers Neck: Supple, trachea midline Cardiovascular: RRR, +S1, S2, no m/g/r, equal pulses throughout Respiratory: CTABL, no w/r/r, normal WOB GI: BS+, NDNT, no masses noted, no organomegaly noted MSK: No c/c; right arm in sling, surgical bandaging CDI Neuro: A&O x 3, no focal deficits Psyc: Appropriate interaction and affect, calm/cooperative  Data Reviewed:   There are no new results to review at this time.  Family Communication: None at bedside Primary team communication: w/ Ortho PA Thank you very much for involving [place] in the care of your patient.  Author: Jonnie Finner, DO 05/11/2022 6:46 PM  For on call review [email or web address].

## 2022-05-11 NOTE — Op Note (Signed)
Orthopaedic Surgery Operative Note (CSN: [id number])  Edward Singleton  1950-12-16 Date of Surgery: 05/11/2022   Diagnoses:  INFECTED RIGHT TOTAL SHOULDER  Procedure: Right removal of infected implants with synovectomy  Right shoulder hemiarthroplasty   Operative Finding Successful completion of planned procedure.  Patient had extraordinary complex initial presentation with a cellulitic arm, fracture dislocation and nerve injury.  We had decided to go forward with a reverse shoulder arthroplasty knowing there is a high risk of infection.  Unfortunately despite the patient doing well for about 6 months he started to have new pain.  CT and x-rays demonstrated loosening consistent with infection.  We discussed the case with the patient's power of attorney his sister and plan for a explant, synovectomy and hemiarthroplasty with antibiotic coated cement.  Gross purulence was noted within the shoulder.  We removed all metal implants and were able to remove any of visible suture.  He had very little tissue but due to his cachexia as well as his axillary nerve injury.  Based on all of this and his inability to fire his deltoid we felt that a hemiarthroplasty was appropriate.  He is at high risk of wound dehiscence due to his lack of soft tissue coverage.  If he fails the surgery he would have a resection arthroplasty.  Post-operative plan: The patient will be NWB in sling.  The patient will be will be admitted to observation due to medical complexity, monitoring and pain management.  DVT prophylaxis Lovenox 40 mg/day until mobilizing and then consider transition in clinic to alternative medicines.  Pain control with PRN pain medication preferring oral medicines.  Follow up plan will be scheduled in approximately 7 days for incision check and XR.  No physical therapy  Implants: Tornier 4 long B stem, 54 x 23 humeral head  Post-Op Diagnosis: Same Surgeons:Primary: Hiram Gash, MD Assistants:Caroline  McBane PA-C Location: [facility] ROOM 06 Anesthesia: General with Exparel Interscalene Antibiotics: Ancef 2g preop, Vancomycin '1000mg'$  locally, 1.2 g tobramycin powder Tourniquet time: None Estimated Blood Loss: 622 Complications: None Specimens: 3 for culture, right shoulder 1 2 and 3 hold for 3 weeks for C acnes Implants: Implant Name Type Inv. Item Serial No. Manufacturer Lot No. LRB No. Used Action  BONE CEMENT GENTAMICIN - [id number] Cement BONE CEMENT GENTAMICIN  [employer]PU[employer] [id number] Right 1 Implanted  HEAD Oren Binet [id number] - [id number] Head HEAD Oren Binet [id number] [employer]  Right 1 Implanted  STEM HUMERAL [id number] 132.5D - [id number] Joint STEM HUMERAL [id number] 132.5D [id number] [employer]  Right 1 Implanted    Indications for Surgery:   Edward Singleton is a 71 y.o. male with history as above.  Benefits and risks of operative and nonoperative management were discussed prior to surgery with patient/guardian(s) and informed consent form was completed.  Patient extremely high risk of continued infection, wound dehiscence and poor function due to is actually nerve palsy.    Procedure:   The patient was identified in the preoperative holding area where the surgical site was marked. Block placed by anesthesia with exparel.  The patient was taken to the OR where a procedural timeout was called and the above noted anesthesia was induced.  The patient was positioned beachchair on allen table with spider arm positioner.  Preoperative antibiotics were dosed.  The patient's right shoulder was prepped and draped in the usual sterile fashion.  A second preoperative timeout was called.       We began by using the previous deltopectoral  incision.  Went through skin sharply achieving hemostasis we progressed.  We identified the previous stitches placed in the deltopectoral interval and opened bluntly.  We able to identify the conjoined tendon.  We stayed lateral to this.  Went  hard to bone on the implant.  At that point we peeled the subscap repair as well as the capsule off as a full layer.  There is very little translation of the subscapularis.  We did placed a stitches for eventual capsular repair.  At this point we are able to identify the implants.  We dislocated the implant and were able to easily extract the humeral stem.  It was grossly purulent and there was significant synovitis.  We took multiple specimens as above.  Complete synovectomy was performed.  We then removed the glenoid implants without issue.  There was no significant bone loss and our vault was intact.  This point we continue her synovectomy performing an excisional debridement of bone, synovium, implants, muscle and fascia.  We irrigated with 4 liters of normal saline until there is no obvious contaminant.  We debrided the intramedullary canal of the humerus using a curette back to a bleeding bony surface.  We placed Irrisept and irrigated again.  At this point we felt that a hemiarthroplasty was in the patient's best interest as a spacer.  As the patient had no axillary nerve function we felt that a metal spacer would be indicated as it would have more durability and we did not plan on more surgery for the patient.  We selected and trialed a Tornier 4 long stem with a 54 spacer to perform essentially as a CTA head.  We prepared the canal and used a modified cement technique wrapping her cement around the implant itself before holding in place in the canal.  We do not feel that true typical cementation would be appropriate as this may make for residual foreign body as the patient is already infected.  We held our implant in place with cement for 15 minutes until the cement was set.  The construct was relatively stable.  We placed 2 FiberWire sutures to try and repair the anterior capsule to avoid anterior instability if possible.  That said the soft tissue covering the implant was quite atrophic as the  patient was quite cachectic and skin and tissue was minimal.  We carefully closed the deltopectoral interval with 3-4 interrupted figure-of-eight #1 PDS sutures before closing the skin with limited Vicryl suture 3 oh and then nylon su  Sterile dressing and sling were placed.  Noemi Chapel, PA-C, present and scrubbed throughout the case, critical for completion in a timely fashion, and for retraction, instrumentation, closure.

## 2022-05-12 ENCOUNTER — Inpatient Hospital Stay: Payer: Self-pay

## 2022-05-12 DIAGNOSIS — E119 Type 2 diabetes mellitus without complications: Secondary | ICD-10-CM

## 2022-05-12 DIAGNOSIS — I150 Renovascular hypertension: Secondary | ICD-10-CM | POA: Diagnosis not present

## 2022-05-12 DIAGNOSIS — M009 Pyogenic arthritis, unspecified: Secondary | ICD-10-CM

## 2022-05-12 DIAGNOSIS — F1010 Alcohol abuse, uncomplicated: Secondary | ICD-10-CM

## 2022-05-12 DIAGNOSIS — M00811 Arthritis due to other bacteria, right shoulder: Secondary | ICD-10-CM | POA: Diagnosis not present

## 2022-05-12 DIAGNOSIS — N1832 Chronic kidney disease, stage 3b: Secondary | ICD-10-CM

## 2022-05-12 DIAGNOSIS — T8459XA Infection and inflammatory reaction due to other internal joint prosthesis, initial encounter: Principal | ICD-10-CM

## 2022-05-12 LAB — BASIC METABOLIC PANEL
Anion gap: 11 (ref 5–15)
BUN: 31 mg/dL — ABNORMAL HIGH (ref 8–23)
CO2: 26 mmol/L (ref 22–32)
Calcium: 9 mg/dL (ref 8.9–10.3)
Chloride: 98 mmol/L (ref 98–111)
Creatinine, Ser: 2.09 mg/dL — ABNORMAL HIGH (ref 0.61–1.24)
GFR, Estimated: 33 mL/min — ABNORMAL LOW (ref >60)
Glucose, Bld: 130 mg/dL — ABNORMAL HIGH (ref 70–99)
Potassium: 4.4 mmol/L (ref 3.5–5.1)
Sodium: 135 mmol/L (ref 135–145)

## 2022-05-12 LAB — CBC
HCT: 28.9 % — ABNORMAL LOW (ref 39.0–52.0)
Hemoglobin: 9 g/dL — ABNORMAL LOW (ref 13.0–17.0)
MCH: 28.9 pg (ref 26.0–34.0)
MCHC: 31.1 g/dL (ref 30.0–36.0)
MCV: 92.9 fL (ref 80.0–100.0)
Platelets: 424 10*3/uL — ABNORMAL HIGH (ref 150–400)
RBC: 3.11 MIL/uL — ABNORMAL LOW (ref 4.22–5.81)
RDW: 16.1 % — ABNORMAL HIGH (ref 11.5–15.5)
WBC: 10.1 10*3/uL (ref 4.0–10.5)
nRBC: 0 % (ref 0.0–0.2)

## 2022-05-12 LAB — CK: Total CK: 242 U/L (ref 49–397)

## 2022-05-12 LAB — GLUCOSE, CAPILLARY
Glucose-Capillary: 116 mg/dL — ABNORMAL HIGH (ref 70–99)
Glucose-Capillary: 111 mg/dL — ABNORMAL HIGH (ref 70–99)
Glucose-Capillary: 95 mg/dL (ref 70–99)
Glucose-Capillary: 109 mg/dL — ABNORMAL HIGH (ref 70–99)

## 2022-05-12 MED ORDER — HYDRALAZINE HCL 25 MG PO TABS
25.0000 mg | ORAL_TABLET | ORAL | Status: DC | PRN
  Filled 2022-05-12: qty 1

## 2022-05-12 MED ORDER — SODIUM CHLORIDE 0.9 % IV SOLN
500.0000 mg | INTRAVENOUS | Status: DC
  Administered 2022-05-12: 500 mg via INTRAVENOUS
  Filled 2022-05-12 (×2): qty 10

## 2022-05-12 MED ORDER — AMLODIPINE BESYLATE 10 MG PO TABS
10.0000 mg | ORAL_TABLET | Freq: Every day | ORAL | Status: DC
  Administered 2022-05-12 – 2022-05-14 (×3): 10 mg via ORAL
  Filled 2022-05-12 (×3): qty 1

## 2022-05-12 MED ORDER — INSULIN ASPART 100 UNIT/ML IJ SOLN: 0.0000 [IU] | Freq: Every day | INTRAMUSCULAR | Status: DC

## 2022-05-12 MED ORDER — LABETALOL HCL 5 MG/ML IV SOLN
10.0000 mg | INTRAVENOUS | Status: DC | PRN
  Filled 2022-05-12: qty 4

## 2022-05-12 MED ORDER — SODIUM CHLORIDE 0.9% FLUSH: 10.0000 mL | INTRAVENOUS | Status: DC | PRN

## 2022-05-12 MED ORDER — CHLORHEXIDINE GLUCONATE CLOTH 2 % EX PADS
6.0000 | MEDICATED_PAD | Freq: Every day | CUTANEOUS | Status: DC
  Administered 2022-05-12 – 2022-05-14 (×3): 6 via TOPICAL

## 2022-05-12 MED ORDER — SODIUM CHLORIDE 0.9% FLUSH
10.0000 mL | Freq: Two times a day (BID) | INTRAVENOUS | Status: DC
  Administered 2022-05-13 – 2022-05-14 (×2): 10 mL

## 2022-05-12 MED ORDER — INSULIN ASPART 100 UNIT/ML IJ SOLN: 0.0000 [IU] | Freq: Three times a day (TID) | INTRAMUSCULAR | Status: DC

## 2022-05-12 NOTE — Progress Notes (Signed)
PHARMACY CONSULT NOTE FOR:  OUTPATIENT  PARENTERAL ANTIBIOTIC THERAPY (OPAT)  Indication: R shoulder PJI  Regimen: Daptomycin 500 mg IV every 24 hours+ Ceftriaxone 2 gm IV Q 24 hours  End date: 06/22/22   IV antibiotic discharge orders are pended. To discharging provider:  please sign these orders via discharge navigator,  Select New Orders & click on the button choice - Manage This Unsigned Work.     Thank you for allowing pharmacy to be a part of this patient's care.  Jimmy Footman, PharmD, BCPS, BCIDP Infectious Diseases Clinical Pharmacist Phone: [phone] 05/12/2022, 11:14 AM

## 2022-05-12 NOTE — Progress Notes (Signed)
Hospitalist Consult Note   Edward Singleton  UDT:[id number]  DOB: 1951/08/13  DOA: 05/11/2022  PCP: Windy Fast, MD  Requesting provider: Dr. Griffin Basil  Reason for consultation: DM, CKD, HTN   History of Present Illness: Edward Singleton is a 71 y.o. male with past medical history of  CAD, PAD, DM2. Presenting with right shoulder pain. Admitted to the orthopedic service for an infected right total shoulder. He has been followed outpatient and has been on abx for some time now. Conservative management was not successful as he has had increased limitation due to shoulder pain. He is now s/p removal of infected implants and right shoulder hemiarthroplasty.  Patient is a poor historian at baseline and states that his family helps him with medications at home.  Assessment and Plan:  Infected right total shoulder - admitted to orthopedic service; started on abx, plan per primary team including pain management   HTN - continue norvasc; BP not at goal this morning, will increase dose -Further adjustments as necessary - Continue as needed hydralazine or labetalol as well   DM2 - A1c 6.1% on admission - transient elevation on 8/2 s/p decadron, CBG should stabilize easily - okay to continue diet control for now - SSI for 24hrs but if not needed will d/c likely    CAD PAD - on asa, crestor at home    CKD3b - patient has history of CKD3b. Baseline creat ~1.9, eGFR 31-38 - currently near baseline, continue following intermittently    Hx of EtOH abuse - negative etoh on admission; states last drink "6 months ago" - does not appear to have any s/s withdrawal at this time - okay to d/c CIWA   HLD - continue statin  Review of Systems: Review of Systems  Constitutional: Negative.   HENT: Negative.    Eyes: Negative.   Respiratory: Negative.    Cardiovascular: Negative.   Gastrointestinal: Negative.   Genitourinary: Negative.   Musculoskeletal: Negative.   Skin:  Negative.   Neurological: Negative.   Endo/Heme/Allergies: Negative.   Psychiatric/Behavioral: Negative.      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   Diabetes mellitus without complication ([place])    Gout    Gout    Headache    Hypercholesteremia    Hypertension    Kidney stones    Left flank pain, chronic    Pelvic fracture ([place]) 11/17/2014   Pneumonia    Prolonged QT syndrome    Septic arthritis of shoulder, right ([place]) 05/11/2022   White matter disease     Past Surgical History: Past Surgical History:  Procedure Laterality Date   ESOPHAGOGASTRODUODENOSCOPY Left 03/18/2019   Procedure: ESOPHAGOGASTRODUODENOSCOPY (EGD);  Surgeon: Jonathon Bellows, MD;  Location: [facility];  Service: Gastroenterology;  Laterality: Left;   left renal stent placement  2013   LITHOTRIPSY     ORIF HUMERUS FRACTURE Left 11/18/2014   Procedure: OPEN REDUCTION INTERNAL FIXATION (ORIF) DISTAL HUMERUS FRACTURE;  Surgeon: Rozanna Box, MD;  Location: [place];  Service: Orthopedics;  Laterality: Left;   REVERSE SHOULDER ARTHROPLASTY Right 10/27/2021   Procedure: REVERSE SHOULDER ARTHROPLASTY;  Surgeon: Hiram Gash, MD;  Location: [place];  Service: Orthopedics;  Laterality: Right;  SPLENECTOMY      Allergies:  No Known Allergies  Social History:  reports that he has been smoking cigarettes. He has a 50.00 pack-year smoking history. He has quit using smokeless tobacco.  His smokeless tobacco use included chew. He reports that he does not currently use alcohol after a past usage of about 42.0 standard drinks of alcohol per week. He reports that he does not use drugs.  Family History: Family History  Problem Relation Age of Onset   Heart disease Mother    Heart disease Father    Diabetes Father    Nephrolithiasis Paternal Grandfather    Kidney  disease Neg Hx    Prostate cancer Neg Hx    Objective: Physical Exam: Vitals:   05/11/22 2130 05/12/22 0202 05/12/22 0558 05/12/22 0952  BP: (!) 151/98 (!) 145/91 (!) 149/88 109/68  Pulse: 81 76 73 75  Resp: $Remo'17 17 17 17  'xiUQx$ Temp: 97.8 F (36.6 C) (!) 97.5 F (36.4 C) 97.8 F (36.6 C) 98.2 F (36.8 C)  TempSrc:      SpO2: 94% 99% 95% 90%  Weight:      Height:       Physical Exam Constitutional:      Appearance: Normal appearance.  HENT:     Head: Normocephalic and atraumatic.     Mouth/Throat:     Mouth: Mucous membranes are moist.  Eyes:     Extraocular Movements: Extraocular movements intact.  Cardiovascular:     Rate and Rhythm: Normal rate and regular rhythm.  Pulmonary:     Effort: Pulmonary effort is normal.     Breath sounds: Normal breath sounds.  Abdominal:     General: Bowel sounds are normal. There is no distension.     Palpations: Abdomen is soft.     Tenderness: There is no abdominal tenderness.  Musculoskeletal:     Cervical back: Normal range of motion and neck supple.     Comments: Right shoulder noted in sling with surgical dressing over right shoulder  Skin:    General: Skin is warm and dry.  Neurological:     General: No focal deficit present.     Mental Status: He is alert.  Psychiatric:        Mood and Affect: Mood normal.        Behavior: Behavior normal.     Data reviewed:  I have personally reviewed following labs and imaging studies Results for orders placed or performed during the hospital encounter of 05/11/22 (from the past 48 hour(s))  Surgical pcr screen     Status: None   Collection Time: 05/11/22 11:08 AM   Specimen: Nasal Mucosa; Nasal Swab  Result Value Ref Range   MRSA, PCR NEGATIVE NEGATIVE   Staphylococcus aureus NEGATIVE NEGATIVE    Comment: (NOTE) The Xpert SA Assay (FDA approved for NASAL specimens in patients 91 years of age and older), is one component of a comprehensive surveillance program. It is not intended to  diagnose infection nor to guide or monitor treatment. Performed at [facility], [place] [address], [place], [place] [address]   Glucose, capillary     Status: None   Collection Time: 05/11/22 11:24 AM  Result Value Ref Range   Glucose-Capillary 95 70 - 99 mg/dL    Comment: Glucose reference range applies only to samples taken after fasting for at least 8 hours.  Type and screen [place]     Status: None   Collection Time:  05/11/22 12:20 PM  Result Value Ref Range   ABO/RH(D) A POS    Antibody Screen NEG    Sample Expiration      05/14/2022,2359 Performed at [facility], [place] [address] [place], [place] [address]   Aerobic/Anaerobic Culture w Gram Stain (surgical/deep wound)     Status: None (Preliminary result)   Collection Time: 05/11/22  3:13 PM   Specimen: Joint, Other; Tissue  Result Value Ref Range   Specimen Description      TISSUE Performed at [facility], [place] [address] [place], [place] [address]    Special Requests      NONE RIGHT SHOULDER 1 Performed at [place] [address], [place], [place] [address]    Gram Stain      RARE WBC PRESENT, PREDOMINANTLY MONONUCLEAR NO ORGANISMS SEEN Performed at [facility], [place] [address] [place], [place] [address]    Culture PENDING    Report Status PENDING   Aerobic/Anaerobic Culture w Gram Stain (surgical/deep wound)     Status: None (Preliminary result)   Collection Time: 05/11/22  3:15 PM   Specimen: Joint, Other; Tissue  Result Value Ref Range   Specimen Description      TISSUE Performed at [facility], [place] [address] [place], [place] [address]    Special Requests      NONE RIGHT SHOULDER 2 Performed at [place] [address], [place], [place] [address]    Gram Stain      NO WBC SEEN NO ORGANISMS SEEN Performed at [facility], [place] [address]  [place], [place] [address]    Culture PENDING    Report Status PENDING   Aerobic/Anaerobic Culture w Gram Stain (surgical/deep wound)     Status: None (Preliminary result)   Collection Time: 05/11/22  3:16 PM   Specimen: Joint, Other; Tissue  Result Value Ref Range   Specimen Description      TISSUE Performed at [facility], [place] [address] [place], [place] [address]    Special Requests      NONE RIGHT SHOULDER 3 Performed at [place] [address] [place], [place] [address]    Gram Stain      NO WBC SEEN NO ORGANISMS SEEN Performed at [facility], [place] [address], [place],  [address]    Culture PENDING    Report Status PENDING   Glucose, capillary     Status: None   Collection Time: 05/11/22  3:58 PM  Result Value Ref Range   Glucose-Capillary 89 70 - 99 mg/dL    Comment: Glucose reference range applies only to samples taken after fasting for at least 8 hours.  Hemoglobin A1c per protocol     Status: Abnormal   Collection Time: 05/11/22  8:24 PM  Result Value Ref Range   Hgb A1c MFr Bld 6.1 (H) 4.8 - 5.6 %    Comment: (NOTE) Pre diabetes:          5.7%-6.4%  Diabetes:              >6.4%  Glycemic control for   <7.0% adults with diabetes    Mean Plasma Glucose 128.37 mg/dL    Comment: Performed at [place] [address], [place],  [address]  Comprehensive metabolic panel per protocol     Status: Abnormal   Collection Time: 05/11/22  8:24 PM  Result  Value Ref Range   Sodium 136 135 - 145 mmol/L   Potassium 4.6 3.5 - 5.1 mmol/L   Chloride 100 98 - 111 mmol/L   CO2 26 22 - 32 mmol/L   Glucose, Bld 233 (H) 70 - 99 mg/dL    Comment: Glucose reference range applies only to samples taken after fasting for at least 8 hours.   BUN 28 (H) 8 - 23 mg/dL   Creatinine, Ser 2.21 (H) 0.61 - 1.24 mg/dL   Calcium 9.0 8.9 - 10.3 mg/dL   Total Protein 6.5 6.5 - 8.1 g/dL   Albumin 3.4 (L) 3.5 - 5.0 g/dL   AST  18 15 - 41 U/L   ALT 11 0 - 44 U/L   Alkaline Phosphatase 99 38 - 126 U/L   Total Bilirubin 0.6 0.3 - 1.2 mg/dL   GFR, Estimated 31 (L) >60 mL/min    Comment: (NOTE) Calculated using the CKD-EPI Creatinine Equation (2021)    Anion gap 10 5 - 15    Comment: Performed at [facility], [place] [address] [place], [place] [address]  CBC per protocol     Status: Abnormal   Collection Time: 05/11/22  8:24 PM  Result Value Ref Range   WBC 13.2 (H) 4.0 - 10.5 K/uL   RBC 3.33 (L) 4.22 - 5.81 MIL/uL   Hemoglobin 9.8 (L) 13.0 - 17.0 g/dL   HCT 31.0 (L) 39.0 - 52.0 %   MCV 93.1 80.0 - 100.0 fL   MCH 29.4 26.0 - 34.0 pg   MCHC 31.6 30.0 - 36.0 g/dL   RDW 16.2 (H) 11.5 - 15.5 %   Platelets 462 (H) 150 - 400 K/uL   nRBC 0.0 0.0 - 0.2 %    Comment: Performed at [facility], [place] [address] [place], [place] [address]  C-reactive protein     Status: None   Collection Time: 05/11/22  8:24 PM  Result Value Ref Range   CRP <0.5 <1.0 mg/dL    Comment: Performed at [facility], [place] [address] [place], [place] [address]  Sedimentation rate     Status: None   Collection Time: 05/11/22  8:24 PM  Result Value Ref Range   Sed Rate 13 0 - 16 mm/hr    Comment: Performed at [facility], [place] [address] [place], [place] [address]  Glucose, capillary     Status: Abnormal   Collection Time: 05/11/22 10:37 PM  Result Value Ref Range   Glucose-Capillary 194 (H) 70 - 99 mg/dL    Comment: Glucose reference range applies only to samples taken after fasting for at least 8 hours.  CBC     Status: Abnormal   Collection Time: 05/12/22  2:42 AM  Result Value Ref Range   WBC 10.1 4.0 - 10.5 K/uL   RBC 3.11 (L) 4.22 - 5.81 MIL/uL   Hemoglobin 9.0 (L) 13.0 - 17.0 g/dL   HCT 28.9 (L) 39.0 - 52.0 %   MCV 92.9 80.0 - 100.0 fL   MCH 28.9 26.0 - 34.0 pg   MCHC 31.1 30.0 - 36.0 g/dL   RDW 16.1 (H) 11.5 - 15.5 %   Platelets 424 (H) 150 - 400 K/uL   nRBC  0.0 0.0 - 0.2 %    Comment: Performed at [facility], [place] [address], [place], [place] [address]  Basic metabolic panel     Status: Abnormal   Collection Time: 05/12/22  2:42 AM  Result Value Ref Range   Sodium 135 135 - 145 mmol/L   Potassium 4.4 3.5 - 5.1 mmol/L   Chloride 98 98 - 111 mmol/L   CO2 26 22 - 32 mmol/L   Glucose, Bld 130 (H) 70 - 99 mg/dL    Comment: Glucose reference range applies only to samples taken after fasting for at least 8 hours.   BUN 31 (H) 8 - 23 mg/dL   Creatinine, Ser 2.09 (H) 0.61 - 1.24 mg/dL   Calcium 9.0 8.9 - 10.3 mg/dL   GFR, Estimated 33 (L) >60 mL/min    Comment: (NOTE) Calculated using the CKD-EPI Creatinine Equation (2021)    Anion gap 11 5 - 15    Comment: Performed at [facility], [place] [address], [place], [place] [address]  Glucose, capillary     Status: Abnormal   Collection Time: 05/12/22  7:49 AM  Result Value Ref Range   Glucose-Capillary 116 (H) 70 - 99 mg/dL    Comment: Glucose reference range applies only to samples taken after fasting for at least 8 hours.    Labs:  CBC: Recent Labs  Lab 05/11/22 2024 05/12/22 0242  WBC 13.2* 10.1  HGB 9.8* 9.0*  HCT 31.0* 28.9*  MCV 93.1 92.9  PLT 462* 424*    Basic Metabolic Panel: Recent Labs  Lab 05/11/22 2024 05/12/22 0242  NA 136 135  K 4.6 4.4  CL 100 98  CO2 26 26  GLUCOSE 233* 130*  BUN 28* 31*  CREATININE 2.21* 2.09*  CALCIUM 9.0 9.0   GFR Estimated Creatinine Clearance: 29.7 mL/min (A) (by C-G formula based on SCr of 2.09 mg/dL (H)). Liver Function Tests: Recent Labs  Lab 05/11/22 2024  AST 18  ALT 11  ALKPHOS 99  BILITOT 0.6  PROT 6.5  ALBUMIN 3.4*   No results for input(s): "LIPASE", "AMYLASE" in the last 168 hours. No results for input(s): "AMMONIA" in the last 168 hours. Coagulation profile No results for input(s): "INR", "PROTIME" in the last 168 hours.  Cardiac Enzymes: No results for input(s): "CKTOTAL",  "CKMB", "CKMBINDEX", "TROPONINI" in the last 168 hours. BNP: Invalid input(s): "POCBNP" CBG: Recent Labs  Lab 05/11/22 1124 05/11/22 1558 05/11/22 2237 05/12/22 0749  GLUCAP 95 89 194* 116*   D-Dimer No results for input(s): "DDIMER" in the last 72 hours. Hgb A1c Recent Labs    05/11/22 2024  HGBA1C 6.1*   Lipid Profile No results for input(s): "CHOL", "HDL", "LDLCALC", "TRIG", "CHOLHDL", "LDLDIRECT" in the last 72 hours. Thyroid function studies No results for input(s): "TSH", "T4TOTAL", "T3FREE", "THYROIDAB" in the last 72 hours.  Invalid input(s): "FREET3" Anemia work up No results for input(s): "VITAMINB12", "FOLATE", "FERRITIN", "TIBC", "IRON", "RETICCTPCT" in the last 72 hours. Urinalysis    Component Value Date/Time   COLORURINE YELLOW (A) 10/18/2021 1755   APPEARANCEUR HAZY (A) 10/18/2021 1755   APPEARANCEUR Clear 05/11/2015 1054   LABSPEC 1.012 10/18/2021 1755   LABSPEC 1.014 01/15/2014 1320   PHURINE 5.0 10/18/2021 1755   GLUCOSEU NEGATIVE 10/18/2021 1755   GLUCOSEU Negative 01/15/2014 1320   HGBUR MODERATE (A) 10/18/2021 1755   BILIRUBINUR NEGATIVE 10/18/2021 1755   BILIRUBINUR Negative 05/11/2015 1054   BILIRUBINUR Negative 01/15/2014 1320   KETONESUR NEGATIVE 10/18/2021 1755   PROTEINUR 100 (A) 10/18/2021 1755   UROBILINOGEN 1.0 11/29/2014 1534   NITRITE NEGATIVE 10/18/2021 1755   LEUKOCYTESUR NEGATIVE 10/18/2021 1755   LEUKOCYTESUR Negative 01/15/2014 1320    Microbiology Recent Results (from the past  240 hour(s))  Surgical pcr screen     Status: None   Collection Time: 05/11/22 11:08 AM   Specimen: Nasal Mucosa; Nasal Swab  Result Value Ref Range Status   MRSA, PCR NEGATIVE NEGATIVE Final   Staphylococcus aureus NEGATIVE NEGATIVE Final    Comment: (NOTE) The Xpert SA Assay (FDA approved for NASAL specimens in patients 43 years of age and older), is one component of a comprehensive surveillance program. It is not intended to diagnose  infection nor to guide or monitor treatment. Performed at [facility], [place] [address], [place], [place] [address]   Aerobic/Anaerobic Culture w Gram Stain (surgical/deep wound)     Status: None (Preliminary result)   Collection Time: 05/11/22  3:13 PM   Specimen: Joint, Other; Tissue  Result Value Ref Range Status   Specimen Description   Final    TISSUE Performed at [place] [address] [place], [place] [address]    Special Requests   Final    NONE RIGHT SHOULDER 1 Performed at [place] [address] [place], [place] [address]    Gram Stain   Final    RARE WBC PRESENT, PREDOMINANTLY MONONUCLEAR NO ORGANISMS SEEN Performed at [facility], [place] [address], [place], [place] [address]    Culture PENDING  Incomplete   Report Status PENDING  Incomplete  Aerobic/Anaerobic Culture w Gram Stain (surgical/deep wound)     Status: None (Preliminary result)   Collection Time: 05/11/22  3:15 PM   Specimen: Joint, Other; Tissue  Result Value Ref Range Status   Specimen Description   Final    TISSUE Performed at [facility], [place] [address] [place], [place] [address]    Special Requests   Final    NONE RIGHT SHOULDER 2 Performed at [place] [address], [place], [place] [address]    Gram Stain   Final    NO WBC SEEN NO ORGANISMS SEEN Performed at [facility], [place] [address], [place], [place] [address]    Culture PENDING  Incomplete   Report Status PENDING  Incomplete  Aerobic/Anaerobic Culture w Gram Stain (surgical/deep wound)     Status: None (Preliminary result)   Collection Time: 05/11/22  3:16 PM   Specimen: Joint, Other; Tissue  Result Value Ref Range Status   Specimen Description   Final    TISSUE Performed at [facility], [place] [address] [place] [address], [place], [place] [address]    Special Requests   Final    NONE RIGHT SHOULDER  3 Performed at [place] [address], [place], [place] [address]    Gram Stain   Final    NO WBC SEEN NO ORGANISMS SEEN Performed at [facility], [place] [address], [place], [place] [address]    Culture PENDING  Incomplete   Report Status PENDING  Incomplete    Inpatient Medications:   Scheduled Meds:  acetaminophen  500 mg Oral Q6H   amLODipine  10 mg Oral Daily   celecoxib  200 mg Oral BID   docusate sodium  100 mg Oral BID   enoxaparin (LOVENOX) injection  30 mg Subcutaneous Y60Y   folic acid  1 mg Oral Daily   insulin aspart  0-5 Units Subcutaneous QHS   insulin aspart  0-9 Units Subcutaneous TID WC   ketorolac  7.5 mg Intravenous Q6H   multivitamin with minerals  1 tablet Oral Daily  rosuvastatin  20 mg Oral Daily   thiamine  100 mg Oral Daily   Or   thiamine  100 mg Intravenous Daily   traMADol  50 mg Oral Q6H   PRN Meds: bisacodyl, diphenhydrAMINE, hydrALAZINE, HYDROcodone-acetaminophen, LORazepam **OR** LORazepam, methocarbamol **OR** methocarbamol (ROBAXIN) IV, metoCLOPramide **OR** metoCLOPramide (REGLAN) injection, ondansetron **OR** ondansetron (ZOFRAN) IV, senna-docusate, sodium phosphate Continuous Infusions:  cefTRIAXone (ROCEPHIN)  IV 2 g (05/11/22 1827)   lactated ringers 10 mL/hr at 05/12/22 0551   methocarbamol (ROBAXIN) IV Stopped (05/11/22 1813)   [START ON 05/13/2022] vancomycin      Radiological Exams on Admission: DG Shoulder Right Port  Result Date: 05/11/2022 CLINICAL DATA:  Postop check. EXAM: RIGHT SHOULDER - 1 VIEW COMPARISON:  Shoulder radiographs 04/22/2022 FINDINGS: The components of the previous reverse shoulder arthroplasty have been removed, and a hemiarthroplasty has been performed with placement of a new femoral component. No acute fracture or dislocation is identified. IMPRESSION: Interval right shoulder hemiarthroplasty. Electronically Signed   By: [place] Bores M.D.   On: 05/11/2022 16:33    Thank you for  this consultation. [facility] hospitalist team will follow the patient with you.   Time spent: Greater than 50% of the 35 minute visit was spent in counseling/coordination of care for the patient as laid out in the A&P.   Dwyane Dee M.D. [facility] 05/12/2022, 10:57 AM Pager: Secure chat

## 2022-05-12 NOTE — Progress Notes (Signed)
   ORTHOPAEDIC PROGRESS NOTE  s/p Procedure(s): REVISION TOTAL SHOULDER TO REVERSE TOTAL SHOULDER  SUBJECTIVE: Reports mild pain about operative site. His pain has improved since last night. Had some drainage from his incision over night which was reinforced. No chest pain. No SOB. No nausea/vomiting. No other complaints.  OBJECTIVE: PE: General: sitting up in hospital bed, NAD Cardiac: regular rate Pulmonary: no increased work of breathing RUE: sling in place and well fitting. Dressing with bloody drainage noted at the distal aspect of the aquacel. Dressing was removed. There was no active drainage noted when dressing removed. Nylon sutures in place. No surrounding erythema. New aquacel was placed. Did see a small amount of drainage on the new dressing, this was reinforced. Continues to have no axillary nerve sensation. This was noted prior to surgery. full and painless ROM throughout hand with DPC of 0. + Motor in  AIN, PIN, Ulnar distributions. Has difficulty with AIN and ulnar motor function compared to contralateral side but was able to perform testing. He endorses distal sensation. Warm well perfused hand.    Vitals:   05/12/22 0202 05/12/22 0558  BP: (!) 145/91 (!) 149/88  Pulse: 76 73  Resp: 17 17  Temp: (!) 97.5 F (36.4 C) 97.8 F (36.6 C)  SpO2: 99% 95%    ASSESSMENT: Edward Singleton is a 70 y.o. male doing well postoperatively. POD#1  PLAN: Weightbearing: NWB RUE - NO PASSIVE OR ACTIVE ROM OF THE RIGHT SHOULDER - Sling at all times - Okay for ROM wrist/hand excercies Insicional and dressing care: Reinforce dressings as needed Orthopedic device(s):  Sling Showering: Hold for now VTE prophylaxis: Lovenox while inpatient. Will transition to Aspirin on discharge Pain control: Avoid all narcotics as able. Recovering alcoholic.  Follow - up plan: 1 week in office  Infectious Disease:  Appreciate ID consultation. Recommended daptomycin and ceftriaxone x 6weeks. Order  for PICC line has been placed/ Medical comorbidities: Appreciate hospitalist team assistance with this patient.   Dispo: TBD. PT/OT evals pending. ID has consulted - recommending IV antibiotics x 6 weeks. PICC line to be placed. Patient's sister, POA, prefers patient to be discharged to SNF. TOC following.   Contact information:  Weekdays 8-5 Dr. Ophelia Charter, Noemi Chapel PA-C, After hours and holidays please check [email or web address] for group call information for [facility]   Noemi Chapel, PA-C 05/12/2022

## 2022-05-12 NOTE — Anesthesia Postprocedure Evaluation (Signed)
Anesthesia Post Note  Patient: Edward Singleton  Procedure(s) Performed: REVISION TOTAL SHOULDER TO REVERSE TOTAL SHOULDER (Right: Shoulder)     Patient location during evaluation: PACU Anesthesia Type: General Level of consciousness: awake and alert Pain management: pain level controlled Vital Signs Assessment: post-procedure vital signs reviewed and stable Respiratory status: spontaneous breathing, nonlabored ventilation, respiratory function stable and patient connected to nasal cannula oxygen Cardiovascular status: blood pressure returned to baseline and stable Postop Assessment: no apparent nausea or vomiting Anesthetic complications: no   No notable events documented.  Last Vitals:  Vitals:   05/12/22 0202 05/12/22 0558  BP: (!) 145/91 (!) 149/88  Pulse: 76 73  Resp: 17 17  Temp: (!) 36.4 C 36.6 C  SpO2: 99% 95%    Last Pain:  Vitals:   05/11/22 2020  TempSrc:   PainSc: Asleep                 Curley Hogen S

## 2022-05-12 NOTE — Consult Note (Signed)
[facility]    Date of Admission:  05/11/2022     Reason for Consult: PJI     Referring Physician: Dr Everardo Pacific  Current antibiotics: Vancomycin Ceftriaxone   ASSESSMENT:    71 y.o. 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  Baseline inflammatory markers are WNL. Cultures from OR are NGTD.  Diabetes: A1c is 6.1 and controlled on oral medications.  CKD3: Creatinine at baseline with CrCl ~30-40. Hx of EtOH use disorder  RECOMMENDATIONS:    Continue ceftriaxone 2gm daily Will stop vancomycin given his CKD to avoid toxicity Start daptomycin per pharamacy Lab monitoring Check CK Follow up cultures Will have PICC line placed Will hold statin temporarily while on daptomycin Will follow for dispo planning   Diagnosis: Right shoulder PJI  Culture Result: NGTD  No Known Allergies  OPAT Orders Discharge antibiotics to be given via PICC line Discharge antibiotics: Per pharmacy protocol  Ceftriaxone 2gm daily Daptomycin 500mg  daily  Duration: 6 weeks End Date: 06/22/22  [facility] Care Per Protocol:  Home health RN for IV administration and teaching; PICC line care and labs.    Labs weekly while on IV antibiotics: _xxx_ CBC with differential __ BMP _xxx_ CMP _xxx_ CRP _xxx_ ESR __ Vancomycin trough _xxx_ CK  _xxx_ Please pull PIC at completion of IV antibiotics __ Please leave PIC in place until doctor has seen patient or been notified  Fax weekly labs to [phone]  Clinic Follow Up Appt: 06/03/22 at 4pm with Dr 06/05/22    Principal Problem:   Septic arthritis of shoulder, right (HCC) Active Problems:   HTN (hypertension)   Alcohol abuse   Chronic renal failure, stage 3b (HCC)   CAD (coronary artery disease)    PAD (peripheral artery disease) (HCC)   Hyperlipidemia   Diabetes (HCC)   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   MEDICATIONS:    Scheduled Meds:  acetaminophen  500 mg Oral Q6H   amLODipine  10 mg Oral Daily   celecoxib  200 mg Oral BID   docusate sodium  100 mg Oral BID   enoxaparin (LOVENOX) injection  30 mg Subcutaneous Q24H   folic acid  1 mg Oral Daily   insulin aspart  0-5 Units Subcutaneous QHS   insulin aspart  0-9 Units Subcutaneous TID WC   ketorolac  7.5 mg Intravenous Q6H   multivitamin with minerals  1 tablet Oral Daily   rosuvastatin  20 mg Oral Daily   thiamine  100 mg Oral Daily   Or   thiamine  100 mg Intravenous Daily   traMADol  50 mg Oral Q6H   Continuous Infusions:  cefTRIAXone (ROCEPHIN)  IV 2 g (05/11/22 1827)   lactated ringers 10 mL/hr at 05/12/22 0551   methocarbamol (ROBAXIN) IV Stopped (05/11/22 1813)   [START ON 05/13/2022] vancomycin     PRN Meds:.bisacodyl, diphenhydrAMINE, hydrALAZINE, HYDROcodone-acetaminophen, labetalol, LORazepam **OR** LORazepam, methocarbamol **OR** methocarbamol (ROBAXIN) IV, metoCLOPramide **OR** metoCLOPramide (REGLAN) injection, ondansetron **OR** ondansetron (ZOFRAN) IV, senna-docusate, sodium phosphate  HPI:    Edward Singleton is a 71 y.o. male with PMHx CAD, PAD, DM, EtOH who is admitted for right shoulder PJI.  He was admitted in January 2023 for alcoholic ketosis, shock, rhabdo, and Strep gallolyticus bacteremia.  At that time, found to have a displaced right proximal humerus fracture.  Transferred to [facility] for operative management.  He was seen by orthopedics and deemed to be a high risk surgical patient due to his co-morbidities and possible cellulitis of right arm.  However, due to his overall picture and nerve injury prior to surgery the patient proceeded with reverse total shoulder arthroplasty on 10/27/21.  He did okay for a several months but recently developed increased  pain in his right shoulder.  Work up showed signs of periprosthetic loosening.  He elected for surgical management and went yesterday afternoon to the OR with Dr Griffin Basil.  He underwent removal of infected implants with synovectomy and subsequent right shoulder hemiarthroplasty with metal spacer as there are no current plans for him to have more surgery.  Purulence was encountered in the OR and patient has been started on Vancomycin and Ceftriaxone.    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   Diabetes mellitus without complication ([place])    Gout    Gout    Headache    Hypercholesteremia    Hypertension    Kidney stones    Left flank pain, chronic    Pelvic fracture ([place]) 11/17/2014   Pneumonia    Prolonged QT syndrome    Septic arthritis of shoulder, right ([place]) 05/11/2022   White matter disease     Social History   Tobacco Use   Smoking status: Every Day    Packs/day: 1.00    Years: 50.00    Total pack years: 50.00    Types: Cigarettes   Smokeless tobacco: Former    Types: Nurse, children's Use: Never used  Substance Use Topics   Alcohol use: Not Currently    Alcohol/week: 42.0 standard drinks of alcohol    Types: 42 Cans of beer per week    Comment: quit june 4th   Drug use: No    Family History  Problem Relation Age of Onset   Heart disease Mother    Heart disease Father    Diabetes Father    Nephrolithiasis Paternal Grandfather    Kidney disease Neg Hx    Prostate cancer Neg Hx     No Known Allergies  Review of Systems  All other systems reviewed and are negative. Except as noted above in the HPI.   OBJECTIVE:   Blood pressure 109/68, pulse 75, temperature 98.2 F (36.8 C), resp. rate 17, height $RemoveBe'5\' 6"'ohKLQBQms$  (1.676 m), weight 68.9 kg, SpO2 90 %. Body mass index is 24.53 kg/m.  Physical Exam Constitutional:       General: He is not in acute distress.    Appearance: Normal appearance.  HENT:     Head: Normocephalic and atraumatic.  Eyes:     Extraocular Movements: Extraocular movements intact.     Conjunctiva/sclera: Conjunctivae normal.  Pulmonary:     Effort: Pulmonary effort is normal. No respiratory distress.  Abdominal:     General: There is no distension.     Palpations: Abdomen is soft.  Musculoskeletal:     Cervical back: Normal range of motion and neck supple.     Comments: Right arm in sling. Bandages over incision site with some bloody drainage on the bandage.   Skin:    General: Skin  is warm and dry.  Neurological:     General: No focal deficit present.     Mental Status: He is alert and oriented to person, place, and time.  Psychiatric:        Mood and Affect: Mood normal.        Behavior: Behavior normal.      Lab Results: Lab Results  Component Value Date   WBC 10.1 05/12/2022   HGB 9.0 (L) 05/12/2022   HCT 28.9 (L) 05/12/2022   MCV 92.9 05/12/2022   PLT 424 (H) 05/12/2022    Lab Results  Component Value Date   NA 135 05/12/2022   K 4.4 05/12/2022   CO2 26 05/12/2022   GLUCOSE 130 (H) 05/12/2022   BUN 31 (H) 05/12/2022   CREATININE 2.09 (H) 05/12/2022   CALCIUM 9.0 05/12/2022   GFRNONAA 33 (L) 05/12/2022   GFRAA 26 (L) 03/19/2019    Lab Results  Component Value Date   ALT 11 05/11/2022   AST 18 05/11/2022   ALKPHOS 99 05/11/2022   BILITOT 0.6 05/11/2022       Component Value Date/Time   CRP <0.5 05/11/2022 2024       Component Value Date/Time   ESRSEDRATE 13 05/11/2022 2024    I have reviewed the micro and lab results in Epic.  Imaging: [place] EKG SITE RITE  Result Date: 05/12/2022 If Site Rite image not attached, placement could not be confirmed due to current cardiac rhythm.  DG Shoulder Right Port  Result Date: 05/11/2022 CLINICAL DATA:  Postop check. EXAM: RIGHT SHOULDER - 1 VIEW COMPARISON:  Shoulder radiographs 04/22/2022 FINDINGS: The  components of the previous reverse shoulder arthroplasty have been removed, and a hemiarthroplasty has been performed with placement of a new femoral component. No acute fracture or dislocation is identified. IMPRESSION: Interval right shoulder hemiarthroplasty. Electronically Signed   By: Logan Bores M.D.   On: 05/11/2022 16:33     Imaging independently reviewed in Epic.  [facility] [phone] pager 05/12/2022, 11:04 AM  I have personally spent 80 minutes involved in face-to-face and non-face-to-face activities for this patient on the day of the visit. Professional time spent includes the following activities: Preparing to see the patient (review of tests), Obtaining and/or reviewing separately obtained history (admission/discharge record), Performing a medically appropriate examination and/or evaluation , Ordering medications/tests/procedures, referring and communicating with other health care professionals, Documenting clinical information in the EMR, Independently interpreting results (not separately reported), Communicating results to the patient/family/caregiver, Counseling and educating the patient/family/caregiver and Care coordination (not separately reported).

## 2022-05-12 NOTE — Evaluation (Signed)
Occupational Therapy Evaluation Patient Details Name: Edward Singleton MRN: [id number] DOB: 02-29-1951 Today's Date: 05/12/2022   History of Present Illness 71 yo male admitted 05/11/22 for revision of Right reverse Total shoulder due to infection. 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   Clinical Impression   Patient is a 71 year old male who was admitted for above. Patient was educated on ice machine, ROM and WB restrictions, sling positioning, and pillow positioning to support RUE in bed and when OOB. Patient was noted to have decreased standing balcne with LOB when transitioning to bathroom with no AD on this date. Patient lives at home alone with only PRN support from sisters who both work. Patient is not at a level to transition home alone safely at this time. Patient would benefit from short term rehab to increase safety with ADLs while maintaining ROM and WB restrictions.      Recommendations for follow up therapy are one component of a multi-disciplinary discharge planning process, led by the attending physician.  Recommendations may be updated based on patient status, additional functional criteria and insurance authorization.   Follow Up Recommendations  Skilled nursing-short term rehab (<3 hours/day)    Assistance Recommended at Discharge Frequent or constant Supervision/Assistance  Patient can return home with the following A lot of help with bathing/dressing/bathroom;Assistance with cooking/housework;Direct supervision/assist for financial management;Assist for transportation;A little help with walking and/or transfers;Help with stairs or ramp for entrance;Direct supervision/assist for medications management    Functional Status Assessment  Patient has had a recent decline in their functional status and demonstrates the ability to make significant improvements in function in a reasonable and predictable amount of time.  Equipment Recommendations        Recommendations for Other Services       Precautions / Restrictions Precautions Precautions: Fall;Shoulder Type of Shoulder Precautions: RUE no ROM shoulder, OK for hand wrist and elbow passive/active Shoulder Interventions: Off for dressing/bathing/exercises;At all times;Shoulder sling/immobilizer Precaution Comments: RUE no ROM Required Braces or Orthoses: Sling Restrictions Weight Bearing Restrictions: Yes RUE Weight Bearing: Non weight bearing      Mobility Bed Mobility Overal bed mobility: Needs Assistance Bed Mobility: Supine to Sit     Supine to sit: Min guard          Transfers                          Balance Overall balance assessment: Mild deficits observed, not formally tested                                         ADL either performed or assessed with clinical judgement   ADL Overall ADL's : Needs assistance/impaired Eating/Feeding: Set up;Sitting   Grooming: Minimal assistance;Sitting   Upper Body Bathing: Maximal assistance;Sitting   Lower Body Bathing: Moderate assistance;Sit to/from stand;Sitting/lateral leans   Upper Body Dressing : Maximal assistance;Sitting Upper Body Dressing Details (indicate cue type and reason): education on how to don shirt over RUE first then LUE. Lower Body Dressing: Maximal assistance;Sit to/from stand Lower Body Dressing Details (indicate cue type and reason): noted to have some unsteadiness in standing Toilet Transfer: Minimal assistance Toilet Transfer Details (indicate cue type and reason): with no AD noted to have one LOB with turning Toileting- Clothing Manipulation and Hygiene: Minimal assistance;Sit to/from stand  Vision Patient Visual Report: No change from baseline       Perception     Praxis      Pertinent Vitals/Pain Pain Assessment Pain Assessment: Faces Faces Pain Scale: Hurts a little bit Pain Location: shoulder Pain Descriptors / Indicators:  Discomfort, Grimacing Pain Intervention(s): Monitored during session     Hand Dominance Right   Extremity/Trunk Assessment Upper Extremity Assessment Upper Extremity Assessment: RUE deficits/detail RUE Deficits / Details: able to move fingers and wrist AROM. no ROM of shoulder per MD order. noted to have drainage on aquasael dressing   Lower Extremity Assessment Lower Extremity Assessment: Defer to PT evaluation   Cervical / Trunk Assessment Cervical / Trunk Assessment: Normal   Communication Communication Communication: No difficulties   Cognition Arousal/Alertness: Awake/alert Behavior During Therapy: WFL for tasks assessed/performed Overall Cognitive Status: Within Functional Limits for tasks assessed                                 General Comments: agreeable to rehab     General Comments       Exercises     Shoulder Instructions      Home Living Family/patient expects to be discharged to:: Skilled nursing facility Living Arrangements: Alone                               Additional Comments: Per chart, pt sister lays out medications, gets his gorceries and transports him to appointments. patient lives at home alone with no AD. sisters work during the day.      Prior Functioning/Environment Prior Level of Function : Independent/Modified Independent             Mobility Comments: uses a cane, does not drive ADLs Comments: IADLs        OT Problem List: Decreased strength;Decreased activity tolerance;Impaired balance (sitting and/or standing);Pain;Impaired UE functional use;Decreased range of motion;Decreased safety awareness;Decreased knowledge of precautions;Decreased knowledge of use of DME or AE      OT Treatment/Interventions: Self-care/ADL training;Therapeutic exercise;Neuromuscular education;Energy conservation;DME and/or AE instruction;Therapeutic activities;Balance training;Patient/family education    OT Goals(Current  goals can be found in the care plan section) Acute Rehab OT Goals Patient Stated Goal: to get better OT Goal Formulation: With patient Time For Goal Achievement: 05/26/22 Potential to Achieve Goals: Fair  OT Frequency: Min 2X/week    Co-evaluation              AM-PAC OT "6 Clicks" Daily Activity     Outcome Measure Help from another person eating meals?: A Little Help from another person taking care of personal grooming?: A Little Help from another person toileting, which includes using toliet, bedpan, or urinal?: A Lot Help from another person bathing (including washing, rinsing, drying)?: A Lot Help from another person to put on and taking off regular upper body clothing?: A Lot Help from another person to put on and taking off regular lower body clothing?: A Lot 6 Click Score: 14   End of Session Equipment Utilized During Treatment: Other (comment) (sling)  Activity Tolerance: Patient tolerated treatment well Patient left: in chair;with call bell/phone within reach (with PT)  OT Visit Diagnosis: Unsteadiness on feet (R26.81);Other abnormalities of gait and mobility (R26.89);Muscle weakness (generalized) (M62.81)                Time: 0950-1020 OT Time Calculation (min): 30 min Charges:  OT General Charges $OT Visit: 1 Visit OT Evaluation $OT Eval Moderate Complexity: 1 Mod  Jackelyn Poling OTR/L, MS Acute Rehabilitation Department Office# [phone] Pager# [phone]   Marcellina Millin 05/12/2022, 11:08 AM

## 2022-05-12 NOTE — Evaluation (Signed)
Physical Therapy Evaluation Patient Details Name: Edward Singleton MRN: [id number] DOB: 12-Jan-1951 Today's Date: 05/12/2022  History of Present Illness  71 yo male admitted 05/11/22 for revision of Right reverse Total shoulder due to infection. 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  Clinical Impression  The patient  admitted for above procedure. Patient lives alone with support of 2 sisters. Patient is a fall risk now with R shoulder surgery revision. Patient will benefit and is agreeable from short rehab stay prior to returning home.  Pt admitted with above diagnosis.  Pt currently with functional limitations due to the deficits listed below (see PT Problem List). Pt will benefit from skilled PT to increase their independence and safety with mobility to allow discharge to the venue listed below.           Recommendations for follow up therapy are one component of a multi-disciplinary discharge planning process, led by the attending physician.  Recommendations may be updated based on patient status, additional functional criteria and insurance authorization.  Follow Up Recommendations Skilled nursing-short term rehab (<3 hours/day) Can patient physically be transported by private vehicle: Yes    Assistance Recommended at Discharge    Patient can return home with the following  A little help with walking and/or transfers;Assistance with cooking/housework;Assist for transportation;A little help with bathing/dressing/bathroom    Equipment Recommendations None recommended by PT  Recommendations for Other Services       Functional Status Assessment       Precautions / Restrictions Precautions Precautions: Fall Precaution Comments: RUE no ROM Required Braces or Orthoses: Sling Restrictions RUE Weight Bearing: Non weight bearing      Mobility  Bed Mobility Overal bed mobility: Needs Assistance Bed Mobility: Sit to Supine       Sit to supine:  Supervision   General bed mobility comments: no assistance, guards RUE in sling    Transfers Overall transfer level: Needs assistance Equipment used: None Transfers: Sit to/from Stand Sit to Stand: Min guard                Ambulation/Gait Ambulation/Gait assistance: Min guard, Min assist Gait Distance (Feet): 100 Feet Assistive device: None Gait Pattern/deviations: Step-through pattern, Drifts right/left       General Gait Details: very close guard, especially with truns, mildly off balance with turns and maneuvres in room around objects  Stairs            Wheelchair Mobility    Modified Rankin (Stroke Patients Only)       Balance Overall balance assessment: Mild deficits observed, not formally tested                                           Pertinent Vitals/Pain Pain Assessment Faces Pain Scale: Hurts a little bit Pain Location: R shoulder Pain Descriptors / Indicators: Discomfort, Grimacing Pain Intervention(s): Monitored during session, Ice applied    Home Living Family/patient expects to be discharged to:: Skilled nursing facility Living Arrangements: Alone                 Additional Comments: Per chart, pt sister lays out medications, gets his gorceries and transports him to appointments. patient lives at home alone with no AD. sisters work during the day.    Prior Function Prior Level of Function : Independent/Modified Independent  Mobility Comments: uses a cane, does not drive ADLs Comments: IADLs     Hand Dominance   Dominant Hand: Right    Extremity/Trunk Assessment        Lower Extremity Assessment Lower Extremity Assessment: Overall WFL for tasks assessed    Cervical / Trunk Assessment Cervical / Trunk Assessment: Normal  Communication   Communication: No difficulties  Cognition Arousal/Alertness: Awake/alert Behavior During Therapy: WFL for tasks assessed/performed Overall  Cognitive Status: Within Functional Limits for tasks assessed                                 General Comments: agreeable to rehab        General Comments      Exercises     Assessment/Plan    PT Assessment Patient needs continued PT services  PT Problem List Decreased balance;Decreased cognition;Decreased knowledge of precautions;Decreased range of motion;Decreased mobility;Decreased activity tolerance;Decreased safety awareness       PT Treatment Interventions DME instruction;Therapeutic activities;Gait training;Therapeutic exercise;Patient/family education;Functional mobility training    PT Goals (Current goals can be found in the Care Plan section)  Acute Rehab PT Goals Patient Stated Goal: wants to return home, will go to rehb PT Goal Formulation: With patient Time For Goal Achievement: 05/26/22 Potential to Achieve Goals: Good    Frequency Min 2X/week     Co-evaluation               AM-PAC PT "6 Clicks" Mobility  Outcome Measure Help needed turning from your back to your side while in a flat bed without using bedrails?: None Help needed moving from lying on your back to sitting on the side of a flat bed without using bedrails?: None Help needed moving to and from a bed to a chair (including a wheelchair)?: None Help needed standing up from a chair using your arms (e.g., wheelchair or bedside chair)?: A Little Help needed to walk in hospital room?: A Little Help needed climbing 3-5 steps with a railing? : A Lot 6 Click Score: 20    End of Session   Activity Tolerance: Patient tolerated treatment well Patient left: in bed;with bed alarm set Nurse Communication: Mobility status PT Visit Diagnosis: Unsteadiness on feet (R26.81);History of falling (Z91.81)    Time: 8250-0370 PT Time Calculation (min) (ACUTE ONLY): 16 min   Charges:   PT Evaluation $PT Eval Low Complexity: Learned [place] Office [phone] Weekend [phone]    Claretha Cooper 05/12/2022, 10:42 AM

## 2022-05-12 NOTE — TOC Initial Note (Addendum)
Transition of Care [facility]) - Initial/Assessment Note    Patient Details  Name: Edward Singleton MRN: [id number] Date of Birth: [birth year]  Transition of Care [facility]) CM/SW Contact:    Lennart Pall, LCSW Phone Number: 05/12/2022, 11:04 AM  Clinical Narrative:                 Met with pt and spoke with sister, [name], today to introduce TOC/CSW role and discuss dc planning needs.  Pt very pleasant and really defers to his sister for final dc planning decisions.  They both feel that the best plan is for SNF rehab again as he has done in the past in order to regain functioning and complete IV abx  regimen.  They prefer a facility in [place] if possible.  Will begin SNF bed search. Regarding pt's long term struggle with ETOH use/ abuse, pt and sister report he has been doing "much better" since his surgery this past January. Pt is aware of resources in the area but is very focused on his current rehab needs for his shoulder and infection management.  Will place resource info on the AVS to be available to him as he chooses.  Expected Discharge Plan: Skilled Nursing Facility Barriers to Discharge: Continued Medical Work up, SNF Pending bed offer   Patient Goals and CMS Choice Patient states their goals for this hospitalization and ongoing recovery are:: return home following SNF rehab and completion of IV abx   Choice offered to / list presented to : Patient  Expected Discharge Plan and Services Expected Discharge Plan: [place] In-house Referral: Clinical Social Work   Post Acute Care Choice: [place] Living arrangements for the past 2 months: [place]                 DME Arranged: N/A DME Agency: NA                  Prior Living Arrangements/Services Living arrangements for the past 2 months: [place] Lives with:: Self Patient language and need for interpreter reviewed:: Yes Do you feel safe going back to the place where you live?:  Yes      Need for Family Participation in Patient Care: No (Comment) Care giver support system in place?: No (comment)   Criminal Activity/Legal Involvement Pertinent to Current Situation/Hospitalization: No - Comment as needed  Activities of Daily Living Home Assistive Devices/Equipment: Cane (specify quad or straight), Dentures (specify type) ADL Screening (condition at time of admission) Patient's cognitive ability adequate to safely complete daily activities?: Yes Is the patient deaf or have difficulty hearing?: No Does the patient have difficulty seeing, even when wearing glasses/contacts?: No Does the patient have difficulty concentrating, remembering, or making decisions?: Yes Patient able to express need for assistance with ADLs?: Yes Does the patient have difficulty dressing or bathing?: No Independently performs ADLs?: Yes (appropriate for developmental age) Communication: Independent Dressing (OT): Independent Grooming: Independent Feeding: Independent Bathing: Independent Toileting: Independent In/Out Bed: Independent Walks in Home: Independent Does the patient have difficulty walking or climbing stairs?: Yes Weakness of Legs: None Weakness of Arms/Hands: Right  Permission Sought/Granted Permission sought to share information with : Family Supports Permission granted to share information with : Yes, Verbal Permission Granted  Share Information with NAME: Chauncy Passy     Permission granted to share info w Relationship: sister  Permission granted to share info w Contact Information: [phone]  Emotional Assessment Appearance:: Appears stated age Attitude/Demeanor/Rapport: Engaged,  Gracious Affect (typically observed): Accepting, Pleasant Orientation: : Oriented to Self, Oriented to Place, Oriented to  Time, Oriented to Situation Alcohol / Substance Use: Alcohol Use Psych Involvement: No (comment)  Admission diagnosis:  Septic arthritis of shoulder, right ([place])  [M00.9] 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 [Z98.890, Z86.19] Patient Active Problem List   Diagnosis Date Noted   Septic arthritis of shoulder, right ([place]) 05/11/2022   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 05/11/2022   Streptococcal bacteremia 10/26/2021   Closed fracture of right proximal humerus 10/26/2021   Urinary retention 10/26/2021   Hyponatremia 37/06/6437   Metabolic acidosis    Shock ([place])    Rhabdomyolysis 10/18/2021   Delayed emergence from anesthesia 03/18/2019   Hypokalemia 38/18/4037   Alcoholic ketoacidosis 54/36/0677   Alcohol withdrawal syndrome with complication ([place]) 03/40/3524   Diabetes ([place]) 02/21/2019   Left rotator cuff tear arthropathy 12/28/2015   Hyperlipidemia 12/24/2015   Tobacco abuse 11/06/2015   Mood disturbance 11/06/2015   CAD (coronary artery disease) 10/16/2015   PAD (peripheral artery disease) ([place]) 10/16/2015   Preventative health care 08/13/2015   Renal artery stenosis ([place]) 07/30/2015   Acute renal failure superimposed on stage 3b chronic kidney disease ([place]) 07/30/2015   Kidney atrophy 07/30/2015   Chronic renal failure, stage 3b ([place]) 07/30/2015   Acute renal failure (ARF) ([place]) 07/23/2015   Insomnia 07/22/2015   Anemia 07/15/2015   History of substance abuse ([place]) 07/15/2015   BPH (benign prostatic hyperplasia) 07/15/2015   Gout 07/15/2015   Kidney stones 05/11/2015   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 [place] [address] Phone: [phone] Fax: [place], [place] - [address] [place] [address] Phone: [phone] Fax: [phone]     Social Determinants of Health (SDOH)  Interventions    Readmission Risk Interventions    05/12/2022   11:02 AM  Readmission Risk Prevention Plan  Transportation Screening Complete  PCP or Specialist Appt within 5-7 Days Complete  Home Care Screening Complete  Medication Review (RN CM) Complete

## 2022-05-12 NOTE — NC FL2 (Signed)
[place] LEVEL OF CARE SCREENING TOOL     IDENTIFICATION  Patient Name: Edward Singleton Birthdate: [birth year] Sex: male Admission Date (Current Location): 05/11/2022  [facility] and [place] Number:  Herbalist and Address:  [facility],  [place], [place]      Provider Number: [id number]  Attending Physician Name and Address:  Hiram Gash, MD  Relative Name and Phone Number:  Chauncy Passy, sister, [phone]    Current Level of Care: Hospital Recommended Level of Care: 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 05/11/2022   Streptococcal bacteremia 10/26/2021   Closed fracture of right proximal humerus 10/26/2021   Urinary retention 10/26/2021   Hyponatremia 46/50/3546   Metabolic acidosis    Shock ([place])    Rhabdomyolysis 10/18/2021   Delayed emergence from anesthesia 03/18/2019   Hypokalemia 56/81/2751   Alcoholic ketoacidosis 70/10/7492   Alcohol withdrawal syndrome with complication ([place]) 49/67/5916   Diabetes ([place]) 02/21/2019   Left rotator cuff tear arthropathy 12/28/2015   Hyperlipidemia 12/24/2015   Tobacco abuse 11/06/2015   Mood disturbance 11/06/2015   CAD (coronary artery disease) 10/16/2015   PAD (peripheral artery disease) ([place]) 10/16/2015   Preventative health care 08/13/2015   Renal artery stenosis ([place]) 07/30/2015   Acute renal failure superimposed on stage 3b chronic kidney disease ([place]) 07/30/2015   Kidney atrophy 07/30/2015   Chronic renal failure, stage 3b ([place]) 07/30/2015   Acute renal failure (ARF) ([place]) 07/23/2015   Insomnia 07/22/2015   Anemia 07/15/2015   History of substance abuse ([place]) 07/15/2015    BPH (benign prostatic hyperplasia) 07/15/2015   Gout 07/15/2015   Kidney stones 05/11/2015   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   HTN (hypertension) 11/17/2014   Alcohol abuse 11/17/2014    Orientation RESPIRATION BLADDER Height & Weight     Self, Time, Situation, Place  Normal Continent Weight: 152 lb (68.9 kg) Height:  '5\' 6"'$  (167.6 cm)  BEHAVIORAL SYMPTOMS/MOOD NEUROLOGICAL BOWEL NUTRITION STATUS      Continent Diet (regular)  AMBULATORY STATUS COMMUNICATION OF NEEDS Skin   Limited Assist Verbally Other (Comment) (surgical incision only)                       Personal Care Assistance Level of Assistance  Bathing, Dressing Bathing Assistance: Limited assistance   Dressing Assistance: Limited assistance     Functional Limitations Info             [place]  PT (By licensed PT), OT (By licensed OT)     PT Frequency: 5x/wk OT Frequency: 5x/wk            Contractures Contractures Info: Not present    Additional Factors Info  Code Status, Allergies, Psychotropic (IV abx) Code Status Info: full Allergies Info: NKDA Psychotropic Info: see MAR         Current Medications (05/12/2022):  This is the current hospital active medication list Current Facility-Administered Medications  Medication Dose Route Frequency Provider Last Rate Last Admin   acetaminophen (TYLENOL) tablet 500 mg  500 mg Oral Q6H McBane, Caroline N, PA-C   500 mg at 05/12/22 [phone]  amLODipine (NORVASC) tablet 10 mg  10 mg Oral Daily Dwyane Dee, MD       bisacodyl (DULCOLAX) EC tablet 5 mg  5 mg Oral Daily PRN McBane, Maylene Roes, PA-C       cefTRIAXone (ROCEPHIN) 2 g in sodium chloride 0.9 % 100 mL IVPB  2 g Intravenous Q24H Ethelda Chick, PA-C 200 mL/hr at 05/11/22 1827 2 g at 05/11/22 1827   celecoxib (CELEBREX) capsule 200 mg  200 mg Oral BID McBane, Caroline N, PA-C       DAPTOmycin (CUBICIN) 500 mg in sodium chloride 0.9 % IVPB  500 mg Intravenous Q24H  Mignon Pine, DO       diphenhydrAMINE (BENADRYL) 12.5 MG/5ML elixir 12.5-25 mg  12.5-25 mg Oral Q4H PRN McBane, Maylene Roes, PA-C       docusate sodium (COLACE) capsule 100 mg  100 mg Oral BID Ethelda Chick, PA-C   100 mg at 05/11/22 2218   enoxaparin (LOVENOX) injection 30 mg  30 mg Subcutaneous Q24H Ethelda Chick, PA-C   30 mg at 78/58/85 0277   folic acid (FOLVITE) tablet 1 mg  1 mg Oral Daily McBane, Caroline N, PA-C       hydrALAZINE (APRESOLINE) tablet 25 mg  25 mg Oral Q4H PRN Dwyane Dee, MD       HYDROcodone-acetaminophen (NORCO) 7.5-325 MG per tablet 1 tablet  1 tablet Oral Q4H PRN Ethelda Chick, PA-C   1 tablet at 05/11/22 1935   insulin aspart (novoLOG) injection 0-5 Units  0-5 Units Subcutaneous QHS Kyle, Tyrone A, DO       insulin aspart (novoLOG) injection 0-9 Units  0-9 Units Subcutaneous TID WC Kyle, Tyrone A, DO       ketorolac (TORADOL) 15 MG/ML injection 7.5 mg  7.5 mg Intravenous Q6H McBane, Caroline N, PA-C   7.5 mg at 05/12/22 0555   labetalol (NORMODYNE) injection 10 mg  10 mg Intravenous Q4H PRN Dwyane Dee, MD       lactated ringers infusion   Intravenous Continuous Ethelda Chick, PA-C 10 mL/hr at 05/12/22 0551 New Bag at 05/12/22 0551   methocarbamol (ROBAXIN) tablet 500 mg  500 mg Oral Q6H PRN Ethelda Chick, PA-C       Or   methocarbamol (ROBAXIN) 500 mg in dextrose 5 % 50 mL IVPB  500 mg Intravenous Q6H PRN Ethelda Chick, PA-C   Stopping previously hung infusion at 05/11/22 1813   metoCLOPramide (REGLAN) tablet 5-10 mg  5-10 mg Oral Q8H PRN McBane, Maylene Roes, PA-C       Or   metoCLOPramide (REGLAN) injection 5-10 mg  5-10 mg Intravenous Q8H PRN McBane, Maylene Roes, PA-C       multivitamin with minerals tablet 1 tablet  1 tablet Oral Daily Kyle, Tyrone A, DO   1 tablet at 05/11/22 2219   ondansetron (ZOFRAN) tablet 4 mg  4 mg Oral Q6H PRN McBane, Maylene Roes, PA-C       Or   ondansetron (ZOFRAN) injection 4 mg  4 mg Intravenous  Q6H PRN McBane, Maylene Roes, PA-C       senna-docusate (Senokot-S) tablet 1 tablet  1 tablet Oral QHS PRN McBane, Maylene Roes, PA-C       sodium phosphate (FLEET) 7-19 GM/118ML enema 1 enema  1 enema Rectal Once PRN McBane, Maylene Roes, PA-C       thiamine (VITAMIN B1) tablet 100 mg  100 mg Oral Daily Kyle, Tyrone A, DO  100 mg at 05/11/22 2216   Or   thiamine (VITAMIN B1) injection 100 mg  100 mg Intravenous Daily Kyle, Tyrone A, DO       traMADol (ULTRAM) tablet 50 mg  50 mg Oral Q6H McBane, Caroline N, PA-C   50 mg at 05/12/22 0555   [START ON 05/13/2022] vancomycin (VANCOREADY) IVPB 500 mg/100 mL  500 mg Intravenous Q36H Eudelia Bunch, [facility]         Discharge Medications: Please see discharge summary for a list of discharge medications.  Relevant Imaging Results:  Relevant Lab Results:   Additional Information MQK:[id number];  daptomycin and ceftriaxone will be plan x 6weeks  Chazz Philson, LCSW

## 2022-05-12 NOTE — Progress Notes (Signed)
Peripherally Inserted Central Catheter Placement  The IV Nurse has discussed with the patient and/or persons authorized to consent for the patient, the purpose of this procedure and the potential benefits and risks involved with this procedure.  The benefits include less needle sticks, lab draws from the catheter, and the patient may be discharged home with the catheter. Risks include, but not limited to, infection, bleeding, blood clot (thrombus formation), and puncture of an artery; nerve damage and irregular heartbeat and possibility to perform a PICC exchange if needed/ordered by physician.  Alternatives to this procedure were also discussed.  Bard Power PICC patient education guide, fact sheet on infection prevention and patient information card has been provided to patient /or left at bedside.    PICC Placement Documentation  PICC Single Lumen 05/12/22 Left Brachial 42 cm 0 cm (Active)  Indication for Insertion or Continuance of Line Home intravenous therapies (PICC only) 05/12/22 1600  Exposed Catheter (cm) 0 cm 05/12/22 1600  Site Assessment Clean, Dry, Intact 05/12/22 1600  Line Status Flushed;Saline locked;Blood return noted 05/12/22 1600  Dressing Type Transparent;Securing device 05/12/22 1600  Dressing Status Antimicrobial disc in place;Clean, Dry, Intact 05/12/22 1600  Safety Lock Not Applicable 93/81/01 7510  Line Care Connections checked and tightened 05/12/22 1600  Dressing Intervention New dressing 05/12/22 1600  Dressing Change Due 05/19/22 05/12/22 1600       Holley Bouche Renee 05/12/2022, 4:18 PM

## 2022-05-13 ENCOUNTER — Encounter (HOSPITAL_COMMUNITY): Payer: Self-pay | Admitting: Orthopaedic Surgery

## 2022-05-13 DIAGNOSIS — Z9889 Other specified postprocedural states: Secondary | ICD-10-CM

## 2022-05-13 DIAGNOSIS — F1010 Alcohol abuse, uncomplicated: Secondary | ICD-10-CM | POA: Diagnosis not present

## 2022-05-13 DIAGNOSIS — M009 Pyogenic arthritis, unspecified: Secondary | ICD-10-CM | POA: Diagnosis not present

## 2022-05-13 DIAGNOSIS — I150 Renovascular hypertension: Secondary | ICD-10-CM | POA: Diagnosis not present

## 2022-05-13 DIAGNOSIS — Z8619 Personal history of other infectious and parasitic diseases: Secondary | ICD-10-CM

## 2022-05-13 DIAGNOSIS — N1832 Chronic kidney disease, stage 3b: Secondary | ICD-10-CM | POA: Diagnosis not present

## 2022-05-13 DIAGNOSIS — E119 Type 2 diabetes mellitus without complications: Secondary | ICD-10-CM | POA: Diagnosis not present

## 2022-05-13 LAB — CBC
HCT: 23.5 % — ABNORMAL LOW (ref 39.0–52.0)
Hemoglobin: 7.4 g/dL — ABNORMAL LOW (ref 13.0–17.0)
MCH: 29.5 pg (ref 26.0–34.0)
MCHC: 31.5 g/dL (ref 30.0–36.0)
MCV: 93.6 fL (ref 80.0–100.0)
Platelets: 351 10*3/uL (ref 150–400)
RBC: 2.51 MIL/uL — ABNORMAL LOW (ref 4.22–5.81)
RDW: 16.6 % — ABNORMAL HIGH (ref 11.5–15.5)
WBC: 18.9 10*3/uL — ABNORMAL HIGH (ref 4.0–10.5)
nRBC: 0 % (ref 0.0–0.2)

## 2022-05-13 LAB — GLUCOSE, CAPILLARY
Glucose-Capillary: 81 mg/dL (ref 70–99)
Glucose-Capillary: 104 mg/dL — ABNORMAL HIGH (ref 70–99)
Glucose-Capillary: 123 mg/dL — ABNORMAL HIGH (ref 70–99)

## 2022-05-13 LAB — CREATININE, SERUM
Creatinine, Ser: 2.37 mg/dL — ABNORMAL HIGH (ref 0.61–1.24)
GFR, Estimated: 29 mL/min — ABNORMAL LOW (ref >60)

## 2022-05-13 MED ORDER — ROSUVASTATIN CALCIUM 20 MG PO TABS: 20.0000 mg | ORAL_TABLET | Freq: Every day | ORAL | 0 refills | Status: DC

## 2022-05-13 MED ORDER — ACETAMINOPHEN ER 650 MG PO TBCR: 650.0000 mg | EXTENDED_RELEASE_TABLET | Freq: Three times a day (TID) | ORAL | 0 refills | Status: DC | PRN

## 2022-05-13 MED ORDER — SODIUM CHLORIDE 0.9 % IV SOLN
500.0000 mg | INTRAVENOUS | Status: DC
  Filled 2022-05-13: qty 10

## 2022-05-13 MED ORDER — DAPTOMYCIN IV (FOR PTA / DISCHARGE USE ONLY): 500.0000 mg | INTRAVENOUS | 0 refills | Status: DC

## 2022-05-13 MED ORDER — ONDANSETRON HCL 4 MG PO TABS: 4.0000 mg | ORAL_TABLET | Freq: Three times a day (TID) | ORAL | 0 refills | Status: AC | PRN

## 2022-05-13 MED ORDER — ASPIRIN 81 MG PO CHEW: 81.0000 mg | CHEWABLE_TABLET | Freq: Two times a day (BID) | ORAL | 0 refills | Status: AC

## 2022-05-13 MED ORDER — CEFTRIAXONE IV (FOR PTA / DISCHARGE USE ONLY): 2.0000 g | INTRAVENOUS | 0 refills | Status: DC

## 2022-05-13 NOTE — Discharge Instructions (Signed)
Ophelia Charter MD, MPH Noemi Chapel, PA-C [facility] [address], [address] [phone] (tel)   [phone] (fax)   [place] may leave the operative dressing in place until your follow-up appointment. KEEP THE INCISIONS CLEAN AND DRY. There may be a small amount of fluid/bleeding leaking at the surgical site. This is normal after surgery.  If it fills with liquid or blood please call us immediately to change it for you. Use the provided ice machine or Ice packs as often as possible for the first 3-4 days, then as needed for pain relief.   Keep a layer of cloth or a shirt between your skin and the cooling unit to prevent frost bite as it can get very cold.  SHOWERING: - You may shower on Post-Op Day #2.  - The dressing is water resistant but do not scrub it as it may start to peel up.   - You may remove the sling for showering - Gently pat the area dry.  - Do not soak the shoulder in water.  - Do not go swimming in the pool or ocean until your incision has completely healed (about 4-6 weeks after surgery) - KEEP THE INCISIONS CLEAN AND DRY.  EXERCISES Wear the sling at all times  You may remove the sling for showering, but keep the arm across the chest or in a secondary sling.    NO range of motion OF THE SHOULDER, KEEP SHOULDER BY YOUR SIDE AT ALL TIMES No physical therapy at this time for the shoulder  It is normal for your fingers/hand to become more swollen after surgery and discolored from bruising.   This will resolve over the first few weeks usually after surgery. Please continue to ambulate and do not stay sitting or lying for too long.  Perform foot and wrist pumps to assist in circulation.  REGIONAL ANESTHESIA (NERVE BLOCKS) The anesthesia team may have performed a nerve block for you this is a great tool used to minimize pain.   The block may start wearing off overnight  (between 8-24 hours postop) When the block wears off, your pain may go from nearly zero to the pain you would have had postop without the block. This is an abrupt transition but nothing dangerous is happening.   This can be a challenging period but utilize your as needed pain medications to try and manage this period. We suggest you use the pain medication the first night prior to going to bed, to ease this transition.  You may take an extra dose of narcotic when this happens if needed   POST-OP MEDICATIONS- Multimodal approach to pain control In general your pain will be controlled with a combination of substances.  Prescriptions unless otherwise discussed are electronically sent to your pharmacy.  This is a carefully made plan we use to minimize narcotic use.     Celebrex - Anti-inflammatory medication taken on a scheduled basis Acetaminophen - Non-narcotic pain medicine taken on a scheduled basis  Aspirin '81mg'$  - This medicine is used to minimize the risk of blood clots after surgery. Zofran -  take as needed for nausea   FOLLOW-UP If you develop a Fever (>101.5), Redness or Drainage from the surgical incision site, please call our office to arrange for an evaluation. Please call the office to schedule a follow-up appointment for a wound check, 7-10 days post-operatively.  IF YOU HAVE ANY QUESTIONS, PLEASE FEEL FREE  TO CALL OUR OFFICE.  HELPFUL INFORMATION  Your arm will be in a sling following surgery. You will be in this sling for the next 4-6 weeks.   You may be more comfortable sleeping in a semi-seated position the first few nights following surgery.  Keep a pillow propped under the elbow and forearm for comfort.  If you have a recliner type of chair it might be beneficial.  If not that is fine too, but it would be helpful to sleep propped up with pillows behind your operated shoulder as well under your elbow and forearm.  This will reduce pulling on the suture lines.  When  dressing, put your operative arm in the sleeve first.  When getting undressed, take your operative arm out last.  Loose fitting, button-down shirts are recommended.  In most states it is against the law to drive while your arm is in a sling. And certainly against the law to drive while taking narcotics.  You may return to work/school in the next couple of days when you feel up to it. Desk work and typing in the sling is fine.  We suggest you use the pain medication the first night prior to going to bed, in order to ease any pain when the anesthesia wears off. You should avoid taking pain medications on an empty stomach as it will make you nauseous.  You should wean off your narcotic medicines as soon as you are able.     Most patients will be off or using minimal narcotics before their first postop appointment.   Do not drink alcoholic beverages or take illicit drugs when taking pain medications.  Pain medication may make you constipated.  Below are a few solutions to try in this order: Decrease the amount of pain medication if you aren't having pain. Drink lots of decaffeinated fluids. Drink prune juice and/or each dried prunes  If the first 3 don't work start with additional solutions Take Colace - an over-the-counter stool softener Take Senokot - an over-the-counter laxative Take Miralax - a stronger over-the-counter laxative   Dental Antibiotics:  In most cases prophylactic antibiotics for Dental procdeures after total joint surgery are not necessary.  Exceptions are as follows:  1. History of prior total joint infection  2. Severely immunocompromised (Organ Transplant, cancer chemotherapy, Rheumatoid biologic meds such as [place])  3. Poorly controlled diabetes (A1C &gt; 8.0, blood glucose over 200)  If you have one of these conditions, contact your surgeon for an antibiotic prescription, prior to your dental procedure.   For more information including helpful videos and  documents visit our website:   [email or web address]

## 2022-05-13 NOTE — Progress Notes (Signed)
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   OT comments  Reiterated shoulder precautions and introduced patient to ice cuff and cooler in order to manage edema and pain. Patient verbalized understanding. Still recommend short term rehab at discharge.   Recommendations for follow up therapy are one component of a multi-disciplinary discharge planning process, led by the attending physician.  Recommendations may be updated based on patient status, additional functional criteria and insurance authorization.    Follow Up Recommendations  Skilled nursing-short term rehab (<3 hours/day)    Assistance Recommended at Discharge Frequent or constant Supervision/Assistance  Patient can return home with the following  A lot of help with bathing/dressing/bathroom;A little help with walking and/or transfers;Assistance with cooking/housework   Equipment Recommendations  Other (comment) (defer)    Recommendations for Other Services      Precautions / Restrictions Precautions Precautions: Fall;Shoulder Type of Shoulder Precautions: RUE no ROM shoulder, OK for hand wrist and elbow passive/active Shoulder Interventions: Off for dressing/bathing/exercises;At all times;Shoulder sling/immobilizer Precaution Booklet Issued: Yes (comment) Precaution Comments: RUE no ROM Required Braces or Orthoses: Sling Restrictions Weight Bearing Restrictions: Yes RUE Weight Bearing: Non weight bearing       Mobility Bed Mobility Overal bed mobility: Needs Assistance Bed Mobility: Supine to Sit     Supine to sit: Supervision          Transfers Overall transfer level: Needs assistance Equipment used: None Transfers: Sit to/from  Stand Sit to Stand: Supervision                 Balance Overall balance assessment: Mild deficits observed, not formally tested                                         ADL either performed or assessed with clinical judgement   ADL Overall ADL's : Needs assistance/impaired                 Upper Body Dressing : Maximal assistance;Sitting Upper Body Dressing Details (indicate cue type and reason): max assist to manage hospital gown and to apply ice cuff. Educated patient on placement and use of cooler in order to educate his caregivers at next venue. he verbalized understanding. Will need assistance to manage ice cuff and cooler.                 Functional mobility during ADLs: Supervision/safety General ADL Comments: Supervision to trasnfer out of bed and transfer to recliner. No overt loss of balance. Needs assistance for IV pole. Reiterated shoulder precautions and positioning. Educatedon the need for ice and edema control as patient reports pain when therapist entered room. Reports ice cuff hasn't been used and "they said not to use that till I go home." Therapist debunked that and asked patient to use it frequently.    Extremity/Trunk Assessment Upper Extremity Assessment RUE Deficits / Details: able to move fingers and wrist AROM. no ROM of shoulder per MD order. noted to have drainage on aquasael dressing            Vision Patient Visual Report: No change from baseline     Perception     Praxis  Cognition Arousal/Alertness: Awake/alert Behavior During Therapy: WFL for tasks assessed/performed Overall Cognitive Status: Within Functional Limits for tasks assessed                                          Exercises      Shoulder Instructions       General Comments      Pertinent Vitals/ Pain       Pain Assessment Pain Assessment: 0-10 Pain Score: 7  Pain Location: R shoulder Pain Descriptors / Indicators:  Discomfort, Grimacing Pain Intervention(s): Limited activity within patient's tolerance, Ice applied  Home Living                                          Prior Functioning/Environment              Frequency  Min 2X/week        Progress Toward Goals  OT Goals(current goals can now be found in the care plan section)  Progress towards OT goals: Progressing toward goals  Acute Rehab OT Goals Patient Stated Goal: to get better OT Goal Formulation: With patient Time For Goal Achievement: 05/26/22 Potential to Achieve Goals: [place] Discharge plan remains appropriate    Co-evaluation                 AM-PAC OT "6 Clicks" Daily Activity     Outcome Measure   Help from another person eating meals?: A Little Help from another person taking care of personal grooming?: A Little Help from another person toileting, which includes using toliet, bedpan, or urinal?: A Little Help from another person bathing (including washing, rinsing, drying)?: A Lot Help from another person to put on and taking off regular upper body clothing?: A Lot Help from another person to put on and taking off regular lower body clothing?: A Lot 6 Click Score: 15    End of Session Equipment Utilized During Treatment: Other (comment)  OT Visit Diagnosis: Unsteadiness on feet (R26.81);Other abnormalities of gait and mobility (R26.89);Muscle weakness (generalized) (M62.81)   Activity Tolerance Patient tolerated treatment well   Patient Left in chair;with call bell/phone within reach;with chair alarm set   Nurse Communication          Time: [phone] OT Time Calculation (min): 22 min  Charges: OT General Charges $OT Visit: 1 Visit OT Treatments $Self Care/Home Management : 8-22 mins  Derl Barrow, OTR/L [place]  Office [phone] Pager: [place] 05/13/2022, 10:05 AM

## 2022-05-13 NOTE — Progress Notes (Signed)
Hospitalist Consult Note   Edward Singleton  VQX:[id number]  DOB: 09-Oct-1951  DOA: 05/11/2022  PCP: Windy Fast, MD  Requesting provider: Dr. Griffin Basil  Reason for consultation: DM, CKD, HTN   History of Present Illness: Edward Singleton is a 71 y.o. male with past medical history of  CAD, PAD, DM2. Presenting with right shoulder pain. Admitted to the orthopedic service for an infected right total shoulder. He has been followed outpatient and has been on abx for some time now. Conservative management was not successful as he has had increased limitation due to shoulder pain. He is now s/p removal of infected implants and right shoulder hemiarthroplasty.  Patient is a poor historian at baseline and states that his family helps him with medications at home.  Interval History: No events overnight. Some R shoulder pain as expected today.  Blood pressure remains stable and controlled  Assessment and Plan:  Infected right total shoulder - admitted to orthopedic service; started on abx, plan per primary team including pain management   HTN - continue norvasc; BP now better after adjustment and no PRNs needed - would continue amlodipine 10 mg daily at discharge  - Continue as needed hydralazine or labetalol as well   DM2 - A1c 6.1% on admission - transient elevation on 8/2 s/p decadron, CBGs have stabilized again - he should be okay to just continue on diet control at discharge; no SSI needed   CAD PAD - on asa, crestor at home    CKD3b - patient has history of CKD3b. Baseline creat ~1.9, eGFR 31-38 - currently near baseline, continue following intermittently    Hx of EtOH abuse - negative etoh on admission; states last drink "6 months ago" - does not appear to have any s/s withdrawal at this time - okay to d/c CIWA   HLD - continue statin  Review of Systems: Review of Systems  Constitutional: Negative.   HENT: Negative.    Eyes: Negative.   Respiratory:  Negative.    Cardiovascular: Negative.   Gastrointestinal: Negative.   Genitourinary: Negative.   Musculoskeletal: Negative.   Skin: Negative.   Neurological: Negative.   Endo/Heme/Allergies: Negative.   Psychiatric/Behavioral: Negative.      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   Diabetes mellitus without complication ([place])    Gout    Gout    Headache    Hypercholesteremia    Hypertension    Kidney stones    Left flank pain, chronic    Pelvic fracture ([place]) 11/17/2014   Pneumonia    Prolonged QT syndrome    Septic arthritis of shoulder, right ([place]) 05/11/2022   White matter disease     Past Surgical History: Past Surgical History:  Procedure Laterality Date   ESOPHAGOGASTRODUODENOSCOPY Left 03/18/2019   Procedure: ESOPHAGOGASTRODUODENOSCOPY (EGD);  Surgeon: Jonathon Bellows, MD;  Location: [facility] ENDOSCOPY;  Service: Gastroenterology;  Laterality: Left;   left renal stent placement  2013   LITHOTRIPSY     ORIF HUMERUS FRACTURE Left 11/18/2014   Procedure: OPEN REDUCTION INTERNAL FIXATION (ORIF) DISTAL HUMERUS FRACTURE;  Surgeon: Rozanna Box, MD;  Location: [place];  Service: Orthopedics;  Laterality: Left;   REVERSE SHOULDER ARTHROPLASTY Right 10/27/2021  Procedure: REVERSE SHOULDER ARTHROPLASTY;  Surgeon: Hiram Gash, MD;  Location: [place];  Service: Orthopedics;  Laterality: Right;   SPLENECTOMY      Allergies:  No Known Allergies  Social History:  reports that he has been smoking cigarettes. He has a 50.00 pack-year smoking history. He has quit using smokeless tobacco.  His smokeless tobacco use included chew. He reports that he does not currently use alcohol after a past usage of about 42.0 standard drinks of alcohol per week. He reports that he does not use drugs.  Family History: Family History  Problem  Relation Age of Onset   Heart disease Mother    Heart disease Father    Diabetes Father    Nephrolithiasis Paternal Grandfather    Kidney disease Neg Hx    Prostate cancer Neg Hx    Objective: Physical Exam: Vitals:   05/12/22 2119 05/12/22 2153 05/13/22 0608 05/13/22 0625  BP: 100/78  131/82   Pulse: 71  73   Resp: 17  17   Temp: 97.7 F (36.5 C)  98.7 F (37.1 C)   TempSrc: Oral  Oral   SpO2: (!) 84% 91% (!) 84% 91%  Weight:      Height:       Physical Exam Constitutional:      Appearance: Normal appearance.  HENT:     Head: Normocephalic and atraumatic.     Mouth/Throat:     Mouth: Mucous membranes are moist.  Eyes:     Extraocular Movements: Extraocular movements intact.  Cardiovascular:     Rate and Rhythm: Normal rate and regular rhythm.  Pulmonary:     Effort: Pulmonary effort is normal.     Breath sounds: Normal breath sounds.  Abdominal:     General: Bowel sounds are normal. There is no distension.     Palpations: Abdomen is soft.     Tenderness: There is no abdominal tenderness.  Musculoskeletal:     Cervical back: Normal range of motion and neck supple.     Comments: Right shoulder noted in sling with surgical dressing over right shoulder  Skin:    General: Skin is warm and dry.  Neurological:     General: No focal deficit present.     Mental Status: He is alert.  Psychiatric:        Mood and Affect: Mood normal.        Behavior: Behavior normal.     Data reviewed:  I have personally reviewed following labs and imaging studies Results for orders placed or performed during the hospital encounter of 05/11/22 (from the past 48 hour(s))  Aerobic/Anaerobic Culture w Gram Stain (surgical/deep wound)     Status: None (Preliminary result)   Collection Time: 05/11/22  3:13 PM   Specimen: Joint, Other; Tissue  Result Value Ref Range   Specimen Description      TISSUE Performed at [facility], [place] [address] [place], [place]  [address]    Special Requests      NONE RIGHT SHOULDER 1 Performed at [place] [address], [place], [place] [address]    Gram Stain      RARE WBC PRESENT, PREDOMINANTLY MONONUCLEAR NO ORGANISMS SEEN    Culture      NO GROWTH 2 DAYS Performed at [facility], [place] [address], [place], [place] [address]    Report Status PENDING   Aerobic/Anaerobic Culture w Gram Stain (surgical/deep wound)     Status: None (  Preliminary result)   Collection Time: 05/11/22  3:15 PM   Specimen: Joint, Other; Tissue  Result Value Ref Range   Specimen Description      TISSUE Performed at [facility], [place] [address] [place], [place] [address]    Special Requests      NONE RIGHT SHOULDER 2 Performed at [place] [address], [place], [place] [address]    Gram Stain NO WBC SEEN NO ORGANISMS SEEN     Culture      NO GROWTH 2 DAYS Performed at [facility], [place] [address] [place], [place] [address]    Report Status PENDING   Aerobic/Anaerobic Culture w Gram Stain (surgical/deep wound)     Status: None (Preliminary result)   Collection Time: 05/11/22  3:16 PM   Specimen: Joint, Other; Tissue  Result Value Ref Range   Specimen Description      TISSUE Performed at [facility], [place] [address], [place], [place] [address]    Special Requests      NONE RIGHT SHOULDER 3 Performed at [place] [address], [place], [place] [address]    Gram Stain NO WBC SEEN NO ORGANISMS SEEN     Culture      NO GROWTH 2 DAYS Performed at [facility], [place] [address] [place], [place] [address]    Report Status PENDING   Glucose, capillary     Status: None   Collection Time: 05/11/22  3:58 PM  Result Value Ref Range   Glucose-Capillary 89 70 - 99 mg/dL    Comment: Glucose reference range applies only to samples taken after fasting for at least 8 hours.  Hemoglobin A1c per  protocol     Status: Abnormal   Collection Time: 05/11/22  8:24 PM  Result Value Ref Range   Hgb A1c MFr Bld 6.1 (H) 4.8 - 5.6 %    Comment: (NOTE) Pre diabetes:          5.7%-6.4%  Diabetes:              >6.4%  Glycemic control for   <7.0% adults with diabetes    Mean Plasma Glucose 128.37 mg/dL    Comment: Performed at [place] [address] [place], [place] [address]  Comprehensive metabolic panel per protocol     Status: Abnormal   Collection Time: 05/11/22  8:24 PM  Result Value Ref Range   Sodium 136 135 - 145 mmol/L   Potassium 4.6 3.5 - 5.1 mmol/L   Chloride 100 98 - 111 mmol/L   CO2 26 22 - 32 mmol/L   Glucose, Bld 233 (H) 70 - 99 mg/dL    Comment: Glucose reference range applies only to samples taken after fasting for at least 8 hours.   BUN 28 (H) 8 - 23 mg/dL   Creatinine, Ser 2.21 (H) 0.61 - 1.24 mg/dL   Calcium 9.0 8.9 - 10.3 mg/dL   Total Protein 6.5 6.5 - 8.1 g/dL   Albumin 3.4 (L) 3.5 - 5.0 g/dL   AST 18 15 - 41 U/L   ALT 11 0 - 44 U/L   Alkaline Phosphatase 99 38 - 126 U/L   Total Bilirubin 0.6 0.3 - 1.2 mg/dL   GFR, Estimated 31 (L) >60 mL/min    Comment: (NOTE) Calculated using the CKD-EPI Creatinine Equation (2021)    Anion gap 10 5 - 15    Comment: Performed at  [facility], [place] [address] [place], [place] [address]  CBC per protocol     Status: Abnormal   Collection Time: 05/11/22  8:24 PM  Result Value Ref Range   WBC 13.2 (H) 4.0 - 10.5 K/uL   RBC 3.33 (L) 4.22 - 5.81 MIL/uL   Hemoglobin 9.8 (L) 13.0 - 17.0 g/dL   HCT 31.0 (L) 39.0 - 52.0 %   MCV 93.1 80.0 - 100.0 fL   MCH 29.4 26.0 - 34.0 pg   MCHC 31.6 30.0 - 36.0 g/dL   RDW 16.2 (H) 11.5 - 15.5 %   Platelets 462 (H) 150 - 400 K/uL   nRBC 0.0 0.0 - 0.2 %    Comment: Performed at [facility], [place] [address] [place], [place] [address]  C-reactive protein     Status: None   Collection Time: 05/11/22  8:24 PM  Result Value Ref Range    CRP <0.5 <1.0 mg/dL    Comment: Performed at [facility], [place] [address], [place], [place] [address]  Sedimentation rate     Status: None   Collection Time: 05/11/22  8:24 PM  Result Value Ref Range   Sed Rate 13 0 - 16 mm/hr    Comment: Performed at [facility], [place] [address] [place], [place] [address]  Glucose, capillary     Status: Abnormal   Collection Time: 05/11/22 10:37 PM  Result Value Ref Range   Glucose-Capillary 194 (H) 70 - 99 mg/dL    Comment: Glucose reference range applies only to samples taken after fasting for at least 8 hours.  CBC     Status: Abnormal   Collection Time: 05/12/22  2:42 AM  Result Value Ref Range   WBC 10.1 4.0 - 10.5 K/uL   RBC 3.11 (L) 4.22 - 5.81 MIL/uL   Hemoglobin 9.0 (L) 13.0 - 17.0 g/dL   HCT 28.9 (L) 39.0 - 52.0 %   MCV 92.9 80.0 - 100.0 fL   MCH 28.9 26.0 - 34.0 pg   MCHC 31.1 30.0 - 36.0 g/dL   RDW 16.1 (H) 11.5 - 15.5 %   Platelets 424 (H) 150 - 400 K/uL   nRBC 0.0 0.0 - 0.2 %    Comment: Performed at [facility], [place] [address], [place], [place] [address]  Basic metabolic panel     Status: Abnormal   Collection Time: 05/12/22  2:42 AM  Result Value Ref Range   Sodium 135 135 - 145 mmol/L   Potassium 4.4 3.5 - 5.1 mmol/L   Chloride 98 98 - 111 mmol/L   CO2 26 22 - 32 mmol/L   Glucose, Bld 130 (H) 70 - 99 mg/dL    Comment: Glucose reference range applies only to samples taken after fasting for at least 8 hours.   BUN 31 (H) 8 - 23 mg/dL   Creatinine, Ser 2.09 (H) 0.61 - 1.24 mg/dL   Calcium 9.0 8.9 - 10.3 mg/dL   GFR, Estimated 33 (L) >60 mL/min    Comment: (NOTE) Calculated using the CKD-EPI Creatinine Equation (2021)    Anion gap 11 5 - 15    Comment: Performed at [facility], [place] [address], [place], [place] [address]  Glucose, capillary     Status: Abnormal   Collection Time: 05/12/22  7:49 AM  Result Value Ref Range   Glucose-Capillary 116 (H) 70 - 99  mg/dL    Comment: Glucose reference range applies  only to samples taken after fasting for at least 8 hours.  CK     Status: None   Collection Time: 05/12/22 11:54 AM  Result Value Ref Range   Total CK 242 49 - 397 U/L    Comment: Performed at [facility], [place] [address] [place], [place] [address]  Glucose, capillary     Status: Abnormal   Collection Time: 05/12/22 12:24 PM  Result Value Ref Range   Glucose-Capillary 111 (H) 70 - 99 mg/dL    Comment: Glucose reference range applies only to samples taken after fasting for at least 8 hours.  Glucose, capillary     Status: None   Collection Time: 05/12/22  5:01 PM  Result Value Ref Range   Glucose-Capillary 95 70 - 99 mg/dL    Comment: Glucose reference range applies only to samples taken after fasting for at least 8 hours.  Glucose, capillary     Status: Abnormal   Collection Time: 05/12/22  9:21 PM  Result Value Ref Range   Glucose-Capillary 109 (H) 70 - 99 mg/dL    Comment: Glucose reference range applies only to samples taken after fasting for at least 8 hours.  CBC     Status: Abnormal   Collection Time: 05/13/22  2:42 AM  Result Value Ref Range   WBC 18.9 (H) 4.0 - 10.5 K/uL   RBC 2.51 (L) 4.22 - 5.81 MIL/uL   Hemoglobin 7.4 (L) 13.0 - 17.0 g/dL   HCT 23.5 (L) 39.0 - 52.0 %   MCV 93.6 80.0 - 100.0 fL   MCH 29.5 26.0 - 34.0 pg   MCHC 31.5 30.0 - 36.0 g/dL   RDW 16.6 (H) 11.5 - 15.5 %   Platelets 351 150 - 400 K/uL   nRBC 0.0 0.0 - 0.2 %    Comment: Performed at [facility], [place] [address], [place], [place] [address]  Glucose, capillary     Status: None   Collection Time: 05/13/22  7:50 AM  Result Value Ref Range   Glucose-Capillary 81 70 - 99 mg/dL    Comment: Glucose reference range applies only to samples taken after fasting for at least 8 hours.  Glucose, capillary     Status: Abnormal   Collection Time: 05/13/22 11:32 AM  Result Value Ref Range   Glucose-Capillary 104 (H) 70 -  99 mg/dL    Comment: Glucose reference range applies only to samples taken after fasting for at least 8 hours.  Creatinine, serum     Status: Abnormal   Collection Time: 05/13/22 12:50 PM  Result Value Ref Range   Creatinine, Ser 2.37 (H) 0.61 - 1.24 mg/dL   GFR, Estimated 29 (L) >60 mL/min    Comment: (NOTE) Calculated using the CKD-EPI Creatinine Equation (2021) Performed at [facility], [place] [address], [place], [place] [address]     Labs:  CBC: Recent Labs  Lab 05/11/22 2024 05/12/22 0242 05/13/22 0242  WBC 13.2* 10.1 18.9*  HGB 9.8* 9.0* 7.4*  HCT 31.0* 28.9* 23.5*  MCV 93.1 92.9 93.6  PLT 462* 424* 351     Basic Metabolic Panel: Recent Labs  Lab 05/11/22 2024 05/12/22 0242 05/13/22 1250  NA 136 135  --   K 4.6 4.4  --   CL 100 98  --   CO2 26 26  --   GLUCOSE 233* 130*  --   BUN 28* 31*  --   CREATININE 2.21* 2.09* 2.37*  CALCIUM 9.0  9.0  --     GFR Estimated Creatinine Clearance: 26.2 mL/min (A) (by C-G formula based on SCr of 2.37 mg/dL (H)). Liver Function Tests: Recent Labs  Lab 05/11/22 2024  AST 18  ALT 11  ALKPHOS 99  BILITOT 0.6  PROT 6.5  ALBUMIN 3.4*    No results for input(s): "LIPASE", "AMYLASE" in the last 168 hours. No results for input(s): "AMMONIA" in the last 168 hours. Coagulation profile No results for input(s): "INR", "PROTIME" in the last 168 hours.  Cardiac Enzymes: Recent Labs  Lab 05/12/22 1154  CKTOTAL 242   BNP: Invalid input(s): "POCBNP" CBG: Recent Labs  Lab 05/12/22 1224 05/12/22 1701 05/12/22 2121 05/13/22 0750 05/13/22 1132  GLUCAP 111* 95 109* 81 104*    D-Dimer No results for input(s): "DDIMER" in the last 72 hours. Hgb A1c Recent Labs    05/11/22 2024  HGBA1C 6.1*    Lipid Profile No results for input(s): "CHOL", "HDL", "LDLCALC", "TRIG", "CHOLHDL", "LDLDIRECT" in the last 72 hours. Thyroid function studies No results for input(s): "TSH", "T4TOTAL", "T3FREE",  "THYROIDAB" in the last 72 hours.  Invalid input(s): "FREET3" Anemia work up No results for input(s): "VITAMINB12", "FOLATE", "FERRITIN", "TIBC", "IRON", "RETICCTPCT" in the last 72 hours. Urinalysis    Component Value Date/Time   COLORURINE YELLOW (A) 10/18/2021 1755   APPEARANCEUR HAZY (A) 10/18/2021 1755   APPEARANCEUR Clear 05/11/2015 1054   LABSPEC 1.012 10/18/2021 1755   LABSPEC 1.014 01/15/2014 1320   PHURINE 5.0 10/18/2021 1755   GLUCOSEU NEGATIVE 10/18/2021 1755   GLUCOSEU Negative 01/15/2014 1320   HGBUR MODERATE (A) 10/18/2021 1755   BILIRUBINUR NEGATIVE 10/18/2021 1755   BILIRUBINUR Negative 05/11/2015 1054   BILIRUBINUR Negative 01/15/2014 1320   KETONESUR NEGATIVE 10/18/2021 1755   PROTEINUR 100 (A) 10/18/2021 1755   UROBILINOGEN 1.0 11/29/2014 1534   NITRITE NEGATIVE 10/18/2021 1755   LEUKOCYTESUR NEGATIVE 10/18/2021 1755   LEUKOCYTESUR Negative 01/15/2014 1320    Microbiology Recent Results (from the past 240 hour(s))  Surgical pcr screen     Status: None   Collection Time: 05/11/22 11:08 AM   Specimen: Nasal Mucosa; Nasal Swab  Result Value Ref Range Status   MRSA, PCR NEGATIVE NEGATIVE Final   Staphylococcus aureus NEGATIVE NEGATIVE Final    Comment: (NOTE) The Xpert SA Assay (FDA approved for NASAL specimens in patients 41 years of age and older), is one component of a comprehensive surveillance program. It is not intended to diagnose infection nor to guide or monitor treatment. Performed at [facility], [place] [address] [place], [place] [address]   Aerobic/Anaerobic Culture w Gram Stain (surgical/deep wound)     Status: None (Preliminary result)   Collection Time: 05/11/22  3:13 PM   Specimen: Joint, Other; Tissue  Result Value Ref Range Status   Specimen Description   Final    TISSUE Performed at [place] [address], [place], [place] [address]    Special Requests   Final    NONE RIGHT SHOULDER  1 Performed at [place] [address] [place], [place] [address]    Gram Stain   Final    RARE WBC PRESENT, PREDOMINANTLY MONONUCLEAR NO ORGANISMS SEEN    Culture   Final    NO GROWTH 2 DAYS Performed at [facility], [place] [address], [place], [place] [address]    Report Status PENDING  Incomplete  Aerobic/Anaerobic Culture w Gram Stain (surgical/deep wound)     Status:  None (Preliminary result)   Collection Time: 05/11/22  3:15 PM   Specimen: Joint, Other; Tissue  Result Value Ref Range Status   Specimen Description   Final    TISSUE Performed at [place] [address] [place], [place] [address]    Special Requests   Final    NONE RIGHT SHOULDER 2 Performed at [place] [address], [place], [place] [address]    Gram Stain NO WBC SEEN NO ORGANISMS SEEN   Final   Culture   Final    NO GROWTH 2 DAYS Performed at [facility], [place] [address] [place], [place] [address]    Report Status PENDING  Incomplete  Aerobic/Anaerobic Culture w Gram Stain (surgical/deep wound)     Status: None (Preliminary result)   Collection Time: 05/11/22  3:16 PM   Specimen: Joint, Other; Tissue  Result Value Ref Range Status   Specimen Description   Final    TISSUE Performed at [facility], [place] [address] [place], [place] [address]    Special Requests   Final    NONE RIGHT SHOULDER 3 Performed at [place] [address] [place], [place] [address]    Gram Stain NO WBC SEEN NO ORGANISMS SEEN   Final   Culture   Final    NO GROWTH 2 DAYS Performed at [facility], [place] [address] [place], [place] [address]    Report Status PENDING  Incomplete    Inpatient Medications:   Scheduled Meds:  amLODipine  10 mg Oral Daily   celecoxib  200 mg Oral BID   Chlorhexidine Gluconate Cloth  6 each Topical Daily   docusate sodium  100 mg Oral BID   enoxaparin  (LOVENOX) injection  30 mg Subcutaneous P53I   folic acid  1 mg Oral Daily   insulin aspart  0-5 Units Subcutaneous QHS   insulin aspart  0-9 Units Subcutaneous TID WC   multivitamin with minerals  1 tablet Oral Daily   sodium chloride flush  10-40 mL Intracatheter Q12H   thiamine  100 mg Oral Daily   Or   thiamine  100 mg Intravenous Daily   traMADol  50 mg Oral Q6H   PRN Meds: bisacodyl, diphenhydrAMINE, hydrALAZINE, HYDROcodone-acetaminophen, labetalol, methocarbamol **OR** methocarbamol (ROBAXIN) IV, metoCLOPramide **OR** metoCLOPramide (REGLAN) injection, ondansetron **OR** ondansetron (ZOFRAN) IV, senna-docusate, sodium chloride flush, sodium phosphate Continuous Infusions:  cefTRIAXone (ROCEPHIN)  IV 2 g (05/12/22 1741)   [START ON 05/14/2022] DAPTOmycin (CUBICIN) 500 mg in sodium chloride 0.9 % IVPB     lactated ringers 10 mL/hr at 05/12/22 1741   methocarbamol (ROBAXIN) IV Stopped (05/11/22 1813)    Radiological Exams on Admission: [place] EKG SITE RITE  Result Date: 05/12/2022 If Site Rite image not attached, placement could not be confirmed due to current cardiac rhythm.  DG Shoulder Right Port  Result Date: 05/11/2022 CLINICAL DATA:  Postop check. EXAM: RIGHT SHOULDER - 1 VIEW COMPARISON:  Shoulder radiographs 04/22/2022 FINDINGS: The components of the previous reverse shoulder arthroplasty have been removed, and a hemiarthroplasty has been performed with placement of a new femoral component. No acute fracture or dislocation is identified. IMPRESSION: Interval right shoulder hemiarthroplasty. Electronically Signed   By: Logan Bores M.D.   On: 05/11/2022 16:33    Thank you for this consultation. [facility] hospitalist team will follow the patient with you.   Time spent: Greater than 50% of the 35 minute visit was  spent in counseling/coordination of care for the patient as laid out in the A&P.   Dwyane Dee M.D. [facility] 05/13/2022, 1:32 PM Pager: Secure chat

## 2022-05-13 NOTE — Progress Notes (Signed)
[place] for Infectious Disease  Date of Admission:  05/11/2022           Reason for visit: PJI                                      Current antibiotics: Daptomycin Ceftriaxone  ASSESSMENT:    71 y.o. 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  Baseline inflammatory markers are WNL. Cultures from OR are NGTD.  Diabetes: A1c is 6.1 and controlled on oral medications.  CKD3: Creatinine at baseline with CrCl ~30-40. 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    MEDICATIONS:    Scheduled Meds:  amLODipine  10 mg Oral Daily   celecoxib  200 mg Oral BID   Chlorhexidine Gluconate Cloth  6 each Topical Daily   docusate sodium  100 mg Oral BID   enoxaparin (LOVENOX) injection  30 mg Subcutaneous H20N   folic acid  1 mg Oral Daily   insulin aspart  0-5 Units Subcutaneous QHS   insulin aspart  0-9 Units Subcutaneous TID WC   multivitamin with minerals  1 tablet Oral Daily   sodium chloride flush  10-40 mL Intracatheter Q12H   thiamine  100 mg Oral Daily   Or   thiamine  100 mg Intravenous Daily   traMADol  50 mg Oral Q6H   Continuous Infusions:  cefTRIAXone (ROCEPHIN)  IV 2 g (05/12/22  1741)   DAPTOmycin (CUBICIN) 500 mg in sodium chloride 0.9 % IVPB 500 mg (05/12/22 1930)   lactated ringers 10 mL/hr at 05/12/22 1741   methocarbamol (ROBAXIN) IV Stopped (05/11/22 1813)   PRN Meds:.bisacodyl, diphenhydrAMINE, hydrALAZINE, HYDROcodone-acetaminophen, labetalol, methocarbamol **OR** methocarbamol (ROBAXIN) IV, metoCLOPramide **OR** metoCLOPramide (REGLAN) injection, ondansetron **OR** ondansetron (ZOFRAN) IV, senna-docusate, sodium chloride flush, sodium phosphate  SUBJECTIVE:   24 hour events:  No acute events  Planning to go to SNF tmrw.  No new complaints.    Review of Systems  All other systems reviewed and are negative.     OBJECTIVE:   Blood pressure 131/82, pulse 73, temperature 98.7 F (37.1 C), temperature source Oral, resp. rate 17, height '5\' 6"'$  (1.676 m), weight 68.9 kg, SpO2 91 %. Body mass index is 24.53 kg/m.  Physical Exam Constitutional:      Appearance: Normal appearance.  HENT:     Head: Normocephalic and atraumatic.  Eyes:     Extraocular Movements: Extraocular movements intact.     Conjunctiva/sclera: Conjunctivae normal.  Pulmonary:     Effort: Pulmonary effort is normal. No respiratory distress.  Musculoskeletal:     Cervical back: Normal range of motion and  neck supple.     Comments: Right arm in sling. Left PICC in place.  Neurological:     General: No focal deficit present.     Mental Status: He is alert and oriented to person, place, and time.  Psychiatric:        Mood and Affect: Mood normal.        Behavior: Behavior normal.      Lab Results: Lab Results  Component Value Date   WBC 18.9 (H) 05/13/2022   HGB 7.4 (L) 05/13/2022   HCT 23.5 (L) 05/13/2022   MCV 93.6 05/13/2022   PLT 351 05/13/2022    Lab Results  Component Value Date   NA 135 05/12/2022   K 4.4 05/12/2022   CO2 26 05/12/2022   GLUCOSE 130 (H) 05/12/2022   BUN 31 (H) 05/12/2022   CREATININE 2.09 (H) 05/12/2022   CALCIUM 9.0 05/12/2022    GFRNONAA 33 (L) 05/12/2022   GFRAA 26 (L) 03/19/2019    Lab Results  Component Value Date   ALT 11 05/11/2022   AST 18 05/11/2022   ALKPHOS 99 05/11/2022   BILITOT 0.6 05/11/2022       Component Value Date/Time   CRP <0.5 05/11/2022 2024       Component Value Date/Time   ESRSEDRATE 13 05/11/2022 2024     I have reviewed the micro and lab results in Epic.  Imaging: [place] EKG SITE RITE  Result Date: 05/12/2022 If Site Rite image not attached, placement could not be confirmed due to current cardiac rhythm.  DG Shoulder Right Port  Result Date: 05/11/2022 CLINICAL DATA:  Postop check. EXAM: RIGHT SHOULDER - 1 VIEW COMPARISON:  Shoulder radiographs 04/22/2022 FINDINGS: The components of the previous reverse shoulder arthroplasty have been removed, and a hemiarthroplasty has been performed with placement of a new femoral component. No acute fracture or dislocation is identified. IMPRESSION: Interval right shoulder hemiarthroplasty. Electronically Signed   By: Logan Bores M.D.   On: 05/11/2022 16:33     Imaging independently reviewed in Epic.    Raynelle Highland for Infectious Disease [facility] [phone] pager 05/13/2022, 9:53 AM

## 2022-05-13 NOTE — Discharge Summary (Signed)
Patient ID: ERYCK NEGRON MRN: [id number] DOB/AGE: 04-06-1951 71 y.o.  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   Past Medical History:  Diagnosis Date   Alcohol abuse    Atrophic kidney    Benign enlargement of prostate    Coronary artery disease    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  He tolerated procedure well.  He was kept for monitoring overnight for pain control, medical monitoring postop, Infectious Disease consultation, PICC line placement, and PT/OT.  It was recommended patient discharge to SNF. He was found to be stable for DC to SNF on 05/14/22.  Patient was instructed on specific activity restrictions and all questions were answered.  Consults: Infectious Disease, Hospitalist team, PT/OT  Significant Diagnostic Studies: Cultures  Treatments: Surgery, PICC line placement  Discharge Exam: General: sitting up in hospital bed, NAD Cardiac: regular rate Pulmonary: no increased work of breathing RUE:  sling in place and well fitting. Dressing with bloody drainage noted at the distal aspect of the aquacel. Dressing was removed. There was no active drainage noted when dressing removed. Nylon sutures in place. No surrounding erythema. New aquacel was placed. Continues to have no axillary nerve sensation. This was noted prior to surgery. full and painless ROM throughout hand with DPC of 0. + Motor in  AIN, PIN, Ulnar distributions. Has difficulty with AIN and ulnar motor function compared to contralateral side but was able to perform testing. He endorses distal sensation. Warm well perfused hand.   Disposition: SNF  Discharge Instructions     Home infusion instructions   Complete by: As directed    Instructions: Flushing of vascular access device: 0.9% NaCl pre/post medication administration and prn patency; Heparin 100 u/ml, 44m for implanted ports and Heparin 10u/ml, 536mfor all other central venous catheters.      Allergies as of 05/13/2022   No Known Allergies      Medication List     STOP taking these medications    aspirin EC 81 MG tablet Replaced by: aspirin 81 MG chewable tablet   doxycycline 100 MG tablet Commonly known as: VIBRA-TABS   HYDROcodone-acetaminophen 5-325 MG tablet Commonly known as: Norco   oxyCODONE-acetaminophen 5-325 MG tablet Commonly known as: Percocet       TAKE these medications    acetaminophen 650 MG CR tablet Commonly known as: Tylenol 8 Hour Take 1 tablet (650 mg total) by mouth every 8 (eight) hours  as needed for pain.   aspirin 81 MG chewable tablet Commonly known as: Aspirin Childrens Chew 1 tablet (81 mg total) by mouth 2 (two) times daily. For 6 weeks for DVT prophylaxis after surgery Replaces: aspirin EC 81 MG tablet   bisacodyl 10 MG suppository Commonly known as: DULCOLAX Place 1 suppository (10 mg total) rectally daily as needed for moderate constipation.   cefTRIAXone  IVPB Commonly known as: ROCEPHIN Inject 2 g into the  vein daily. Indication:  R shoulder PJI First Dose: Yes Last Day of Therapy:  06/22/22  Labs - Once weekly:  CBC/D and BMP, Labs - Every other week:  ESR and CRP   celecoxib 100 MG capsule Commonly known as: CELEBREX Take 100 mg by mouth 2 (two) times daily.   daptomycin  IVPB Commonly known as: CUBICIN Inject 500 mg into the vein daily. Indication:  R  Shoulder PJI  First Dose: Yes Last Day of Therapy:  06/22/22  Labs - Once weekly:  CBC/D, BMP, and CPK Labs - Every other week:  ESR and CRP   folic acid 1 MG tablet Commonly known as: FOLVITE Take 1 mg by mouth daily.   metFORMIN 500 MG tablet Commonly known as: GLUCOPHAGE Take 500 mg by mouth 2 (two) times daily.   methocarbamol 500 MG tablet Commonly known as: ROBAXIN Take 1 tablet (500 mg total) by mouth every 6 (six) hours as needed for muscle spasms.   ondansetron 4 MG tablet Commonly known as: Zofran Take 1 tablet (4 mg total) by mouth every 8 (eight) hours as needed for up to 7 days for nausea or vomiting.   polyethylene glycol 17 g packet Commonly known as: MIRALAX / GLYCOLAX Take 17 g by mouth 2 (two) times daily.   rosuvastatin 20 MG tablet Commonly known as: CRESTOR Take 1 tablet (20 mg total) by mouth daily. Hold while on daptomycin Start taking on: June 23, 2022 What changed:  additional instructions These instructions start on June 23, 2022. If you are unsure what to do until then, ask your doctor or other care provider.   senna-docusate 8.6-50 MG tablet Commonly known as: Senokot-S Take 2 tablets by mouth 2 (two) times daily.   thiamine 100 MG tablet Commonly known as: Vitamin B-1 Take 100 mg by mouth daily.               Home Infusion Instuctions  (From admission, onward)           Start     Ordered   05/13/22 0000  Home infusion instructions       Question:  Instructions  Answer:  Flushing of vascular access device: 0.9% NaCl pre/post medication administration and prn  patency; Heparin 100 u/ml, 75m for implanted ports and Heparin 10u/ml, 555mfor all other central venous catheters.   05/13/22 0641            Contact information for after-discharge care     Destination     [facility] [place] [facility] Preferred SNF .   Service: Skilled Nursing Contact information: [id number]                    CaNoemi ChapelPA-C

## 2022-05-13 NOTE — Plan of Care (Signed)
  Problem: Education: Goal: Knowledge of General Education information will improve Description: Including pain rating scale, medication(s)/side effects and non-pharmacologic comfort measures Outcome: Progressing   Problem: Pain Managment: Goal: General experience of comfort will improve Outcome: Progressing   

## 2022-05-13 NOTE — TOC Progression Note (Signed)
Transition of Care [facility]) - Progression Note    Patient Details  Name: Edward Singleton MRN: [id number] Date of Birth: 1951/09/03  Transition of [facility]) CM/SW Contact  Lennart Pall, LCSW Phone Number: 05/13/2022, 2:47 PM  Clinical Narrative:     Pt has accepted SNF bed at [facility] who can admit pt tomorrow and have confirmed they can manage the IV abx.  Will alert weekend TOC of planned dc.    Expected Discharge Plan: [place] Barriers to Discharge: Continued Medical Work up, SNF Pending bed offer  Expected Discharge Plan and Services Expected Discharge Plan: [place] In-house Referral: Clinical Social Work   Post Acute Care Choice: [place] Living arrangements for the past 2 months: Single Family Home                 DME Arranged: N/A DME Agency: NA                   Social Determinants of Health (SDOH) Interventions    Readmission Risk Interventions    05/12/2022   11:02 AM  Readmission Risk Prevention Plan  Transportation Screening Complete  PCP or Specialist Appt within 5-7 Days Complete  Home Care Screening Complete  Medication Review (RN CM) Complete

## 2022-05-13 NOTE — Progress Notes (Signed)
   ORTHOPAEDIC PROGRESS NOTE  s/p Procedure(s): REVISION TOTAL SHOULDER TO REVERSE TOTAL SHOULDER on 05/11/22   SUBJECTIVE: Reports mild pain about operative site. He is agreeable to SNF. No chest pain. No SOB. No nausea/vomiting. No other complaints.  OBJECTIVE: PE: General: sitting up in hospital bed, NAD Cardiac: regular rate Pulmonary: no increased work of breathing RUE: sling in place and well fitting. Dressing with bloody drainage noted at the distal aspect of the aquacel. Dressing was removed. There was no active drainage noted when dressing removed. Nylon sutures in place. No surrounding erythema. New aquacel was placed. Continues to have no axillary nerve sensation. This was noted prior to surgery. full and painless ROM throughout hand with DPC of 0. + Motor in  AIN, PIN, Ulnar distributions. Has difficulty with AIN and ulnar motor function compared to contralateral side but was able to perform testing. He endorses distal sensation. Warm well perfused hand.    Vitals:   05/13/22 0608 05/13/22 0625  BP: 131/82   Pulse: 73   Resp: 17   Temp: 98.7 F (37.1 C)   SpO2: (!) 84% 91%    ASSESSMENT: ELL TISO is a 71 y.o. male doing well postoperatively. POD#2  PLAN: Weightbearing: NWB RUE - NO PASSIVE OR ACTIVE ROM OF THE RIGHT SHOULDER - Sling at all times - Okay for ROM wrist/hand excercies Insicional and dressing care: Reinforce dressings as needed Orthopedic device(s):  Sling Showering: Hold for now VTE prophylaxis: Lovenox while inpatient. Will transition to Aspirin on discharge Pain control: Avoid all narcotics as able. Recovering alcoholic.  Follow - up plan: 1 week in office  Infectious Disease:  Appreciate ID consultation. Recommended daptomycin and ceftriaxone x 6weeks. Order for PICC line has been placed. Medical comorbidities: Appreciate hospitalist team assistance with this patient.   Dispo: ID has consulted - recommending IV antibiotics x 6 weeks. PICC  line to be placed. Patient's sister, POA, prefers patient to be discharged to SNF. TOC following. Plan to discharge to SNF tomorrow. Discharge medications printed and placed in patient's chart.   Contact information:  Weekdays 8-5 Dr. Ophelia Charter, Noemi Chapel PA-C, After hours and holidays please check [email or web address] for group call information for [facility]   Noemi Chapel, PA-C 05/13/2022

## 2022-05-14 LAB — CREATININE, SERUM
Creatinine, Ser: 1.72 mg/dL — ABNORMAL HIGH (ref 0.61–1.24)
GFR, Estimated: 42 mL/min — ABNORMAL LOW (ref >60)

## 2022-05-14 LAB — GLUCOSE, CAPILLARY
Glucose-Capillary: 107 mg/dL — ABNORMAL HIGH (ref 70–99)
Glucose-Capillary: 156 mg/dL — ABNORMAL HIGH (ref 70–99)

## 2022-05-14 MED ORDER — HEPARIN SOD (PORK) LOCK FLUSH 100 UNIT/ML IV SOLN
250.0000 [IU] | INTRAVENOUS | Status: AC | PRN
Start: 2022-05-14 — End: 2022-05-14
  Administered 2022-05-14: 250 [IU]
  Filled 2022-05-14: qty 2.5

## 2022-05-14 MED ORDER — AMLODIPINE BESYLATE 10 MG PO TABS: 10.0000 mg | ORAL_TABLET | Freq: Every day | ORAL | Status: DC

## 2022-05-14 NOTE — Progress Notes (Signed)
The patient is alert and oriented and has been seen by his physician. The orders for discharge were written. PICC line has been flushed and capped by IV team. Martin Majestic over discharge instructions with patient. Aquacel dressing is clean, dry, and intact. He is being discharged to [facility] SNF via EMS with all of his belongings.

## 2022-05-14 NOTE — Plan of Care (Signed)
  Problem: Education: Goal: Knowledge of General Education information will improve Description: Including pain rating scale, medication(s)/side effects and non-pharmacologic comfort measures 05/14/2022 0156 by Jerrol Banana, RN Outcome: Progressing 05/14/2022 0041 by Jerrol Banana, RN Outcome: Progressing   Problem: Health Behavior/Discharge Planning: Goal: Ability to manage health-related needs will improve Outcome: Progressing   Problem: Activity: Goal: Risk for activity intolerance will decrease Outcome: Progressing   Problem: Nutrition: Goal: Adequate nutrition will be maintained 05/14/2022 0156 by Jerrol Banana, RN Outcome: Progressing 05/14/2022 0041 by Jerrol Banana, RN Outcome: Progressing   Problem: Coping: Goal: Level of anxiety will decrease 05/14/2022 0156 by Jerrol Banana, RN Outcome: Progressing 05/14/2022 0041 by Jerrol Banana, RN Outcome: Progressing

## 2022-05-14 NOTE — Progress Notes (Signed)
Called report to Corrin Parker, LPN at [facility].

## 2022-05-14 NOTE — TOC Transition Note (Addendum)
Transition of Care [facility]) - CM/SW Discharge Note   Patient Details  Name: Edward Singleton MRN: [id number] Date of Birth: October 26, 1950  Transition of Care [facility]) CM/SW Contact:  Ross Ludwig, LCSW Phone Number: 05/14/2022, 10:08 AM   Clinical Narrative:     Patient to be d/c'ed today to [facility] room 601A in [place].  Patient and family agreeable to plans will transport via ems RN to call report [phone].  CSW updated patient's sister [name] who is aware that patient is discharging today.  Final next level of care: Skilled Nursing Facility Barriers to Discharge: Barriers Resolved   Patient Goals and CMS Choice Patient states their goals for this hospitalization and ongoing recovery are:: To go to SNF then return back home with home health. CMS Medicare.gov Compare Post Acute Care list provided to:: Patient Choice offered to / list presented to : Patient  Discharge Placement PASRR number recieved: 05/12/22            Patient chooses bed at: [facility] Patient to be transferred to facility by: [facility] Name of family member notified: Patient's sister [name] Patient and family notified of of transfer: 05/14/22  Discharge Plan and Services In-house Referral: Clinical Social Work   Post Acute Care Choice: [place]          DME Arranged: N/A DME Agency: NA                  Social Determinants of Health ([place]) Interventions     Readmission Risk Interventions    05/12/2022   11:02 AM  Readmission Risk Prevention Plan  Transportation Screening Complete  PCP or Specialist Appt within 5-7 Days Complete  Home Care Screening Complete  Medication Review (RN CM) Complete

## 2022-05-14 NOTE — Plan of Care (Signed)

## 2022-05-14 NOTE — Plan of Care (Signed)
  Problem: Education: Goal: Knowledge of General Education information will improve Description Including pain rating scale, medication(s)/side effects and non-pharmacologic comfort measures Outcome: Progressing   Problem: Health Behavior/Discharge Planning: Goal: Ability to manage health-related needs will improve Outcome: Progressing   

## 2022-05-19 ENCOUNTER — Telehealth: Payer: Self-pay | Admitting: Internal Medicine

## 2022-05-19 NOTE — Telephone Encounter (Signed)
error 

## 2022-06-01 ENCOUNTER — Telehealth: Payer: Self-pay

## 2022-06-01 LAB — AEROBIC/ANAEROBIC CULTURE W GRAM STAIN (SURGICAL/DEEP WOUND)
Culture: NO GROWTH
Culture: NO GROWTH
Gram Stain: NONE SEEN

## 2022-06-01 NOTE — Telephone Encounter (Signed)
Returned call to patients sister Edward Singleton) about a concern she has. Edward Singleton stated that her brother (Edward Singleton) is currently at [facility] facility [phone]) and they informed her that his [place] insurance will not pay for his IV antibiotics and she will have to pay $1000 before he can be released home tomorrow. They also told her she has to come to the facility to learn how to administered the antibiotics to him at home and Edward Singleton stated that she is not comfortable doing that and they told her that the patient can do it himself which is not feasible and the [place] insurance told the facility that as well. They told her they can administer the medicine at the facility, but she has to pay for the antibiotics out of pocket as well as $200 (a total of $1400) a day for the patient to continue to stay there. Edward Singleton stated that she can not afford any of this and wanted to know what she should do.

## 2022-06-01 NOTE — Telephone Encounter (Signed)
Dr. Juleen China - do you mind giving the NP a call? If not, I can... just not sure if I know how to answer her questions regarding LFTs, etc. Thank you!

## 2022-06-01 NOTE — Telephone Encounter (Signed)
Received voicemail from Liz Malady, NP with [facility] to discuss lab results.   She states patient's WBC was 18.2, ESR 98, CRP >28.6, and LFTs are elevated. She would like a call back at [phone]. Will route to pharmacy team to review labs and discuss with Liz Malady, NP.   Beryle Flock, RN

## 2022-06-01 NOTE — Telephone Encounter (Signed)
[name], NP at [facility] called to follow up regarding labs. I advised Edward Singleton that a message has been sent to [name] and he would give her call when he is available. Call back number is [name], [name]

## 2022-06-02 ENCOUNTER — Other Ambulatory Visit: Payer: Self-pay | Admitting: Pharmacist

## 2022-06-02 ENCOUNTER — Telehealth: Payer: Self-pay | Admitting: Pharmacist

## 2022-06-02 ENCOUNTER — Other Ambulatory Visit (HOSPITAL_COMMUNITY): Payer: Self-pay

## 2022-06-02 DIAGNOSIS — T8450XD Infection and inflammatory reaction due to unspecified internal joint prosthesis, subsequent encounter: Secondary | ICD-10-CM

## 2022-06-02 LAB — AEROBIC/ANAEROBIC CULTURE W GRAM STAIN (SURGICAL/DEEP WOUND)
Culture: NO GROWTH
Gram Stain: NONE SEEN

## 2022-06-02 MED ORDER — CEFADROXIL 500 MG PO CAPS: 1000.0000 mg | ORAL_CAPSULE | Freq: Two times a day (BID) | ORAL | 0 refills | Status: DC

## 2022-06-02 MED ORDER — DOXYCYCLINE HYCLATE 100 MG PO TABS: 100.0000 mg | ORAL_TABLET | Freq: Two times a day (BID) | ORAL | 0 refills | Status: DC

## 2022-06-02 NOTE — Telephone Encounter (Signed)
Called Faith at [phone] regarding oral antibiotic orders. Gave verbal for her to start linezolid 600 mg PO BID and cefadroxil 1gm PO BID. Gave stop date of 9/13 for now since that was the stop date for his IV antibiotics. I told her that we would most likely continue antibiotics for suppression after 9/13, but that we would call with updated orders if needed after his appointment with Dr. Juleen China tomorrow.   Of note, his SCr has fluctuated over the last few months. Most recent SCr on 8/5 was 1.72. CrCl is estimated to be ~39 ml/min. Dose adjustment starts at 40 ml/min. Since he continues to need treatment dosing for right shoulder PJI will push dose and keep at standard dosing of 1gm PO BID (instead of decreasing to 500 mg PO BID).   Loye Vento L. Chiana Wamser, PharmD, BCIDP, AAHIVP, CPP Clinical Pharmacist Practitioner Infectious Diseases [place] [facility] Infectious Disease 06/02/2022, 9:42 AM

## 2022-06-02 NOTE — Telephone Encounter (Signed)
Patient's sister called stating that linezolid was too expensive and patient could not afford copay. Per Dr. Juleen China, ok to replace linezolid with doxycycline 100 mg PO BID and to continue plan with cefadroxil 1gm PO BID. Relayed information to sister. Called Liz Malady, NP at [phone] and relayed updated information to her for her documentation. Sent prescriptions to [facility] per sister's request. Will update Faith if anything changes after his appointment tomorrow with Dr. Juleen China.   Edward Singleton, PharmD, BCIDP, AAHIVP, CPP Clinical Pharmacist Practitioner Infectious Diseases [place] [facility] Infectious Disease 06/02/2022, 4:10 PM

## 2022-06-02 NOTE — Telephone Encounter (Signed)
Received voicemail from Liz Malady, NP requesting call back from pharmacy staff for PO antibiotic orders. Message forwarded to pharmacist.   Beryle Flock, RN

## 2022-06-03 ENCOUNTER — Encounter: Payer: Self-pay | Admitting: Internal Medicine

## 2022-06-03 ENCOUNTER — Other Ambulatory Visit: Payer: Self-pay

## 2022-06-03 ENCOUNTER — Ambulatory Visit: Payer: No Typology Code available for payment source | Admitting: Internal Medicine

## 2022-06-03 ENCOUNTER — Ambulatory Visit (INDEPENDENT_AMBULATORY_CARE_PROVIDER_SITE_OTHER): Payer: No Typology Code available for payment source | Admitting: Internal Medicine

## 2022-06-03 VITALS — BP 157/88 | HR 81 | Resp 16 | Ht 66.0 in | Wt 128.0 lb

## 2022-06-03 DIAGNOSIS — B955 Unspecified streptococcus as the cause of diseases classified elsewhere: Secondary | ICD-10-CM | POA: Diagnosis not present

## 2022-06-03 DIAGNOSIS — R7881 Bacteremia: Secondary | ICD-10-CM

## 2022-06-03 DIAGNOSIS — M00811 Arthritis due to other bacteria, right shoulder: Secondary | ICD-10-CM

## 2022-06-03 DIAGNOSIS — T8450XA Infection and inflammatory reaction due to unspecified internal joint prosthesis, initial encounter: Secondary | ICD-10-CM

## 2022-06-03 DIAGNOSIS — R634 Abnormal weight loss: Secondary | ICD-10-CM | POA: Diagnosis not present

## 2022-06-03 DIAGNOSIS — M009 Pyogenic arthritis, unspecified: Secondary | ICD-10-CM

## 2022-06-03 DIAGNOSIS — Z96611 Presence of right artificial shoulder joint: Secondary | ICD-10-CM | POA: Diagnosis not present

## 2022-06-03 DIAGNOSIS — T8450XD Infection and inflammatory reaction due to unspecified internal joint prosthesis, subsequent encounter: Secondary | ICD-10-CM

## 2022-06-03 MED ORDER — CEFADROXIL 500 MG PO CAPS
1000.0000 mg | ORAL_CAPSULE | Freq: Two times a day (BID) | ORAL | 0 refills | Status: DC
Start: 2022-06-03 — End: 2022-10-11

## 2022-06-03 MED ORDER — DOXYCYCLINE HYCLATE 100 MG PO TABS: 100.0000 mg | ORAL_TABLET | Freq: Two times a day (BID) | ORAL | 0 refills | Status: DC

## 2022-06-03 NOTE — Progress Notes (Signed)
[facility]  CHIEF COMPLAINT:    Follow up for shoulder infection  SUBJECTIVE:    Edward Singleton is a 71 y.o. male with PMHx as below who presents to the clinic for right shoulder PJI.   Patient here today for follow up.  He is here today with his sister.  He initially underwent reverse shoulder arthroplasty 10/27/21 and did okay.  This was in the setting of a recent Strep gallolyticus bacteremia.    He recently developed PJI and is status post removal of infected implants and hemiarthroplasty with metal spacer 05/11/22 with Dr Griffin Basil.  There are no plans to perform another 2nd stage exchange.  His baseline inflammatory markers were normal.  His OR cultures were negative.  He had a PICC line placed prior to discharge to SNF and sent out on daptomycin and ceftriaxone.  He was supposed to be on IV antibiotics until 06/22/22 (6 weeks), however, issues arose at his SNF where the [place] would not approve PICC line at home given history of EtOH use disorder and his sister was unable to provide enough help to administer IV therapy at home.    As such, it was decided to discharge him from SNF on Linezolid and Cefadroxil.  However, Linezolid proved to be cost prohibitive as well.  Thus, he was discharged yesterday on Cefadroxil 1gm BID and Doxycycline $RemoveBeforeD'100mg'hfSNVKBunLOoej$  BID.  Additionally, his labs were abnormal this week at the SNF.  WBC increased from 10 to 18, AST/ALT were in the 50's, and creatinine bumped up to 1.7 although baseline appears to be about 1.9.  His CK was normal.  Blood cultures were obtained and PICC line removed.    He saw Dr Rich Fuchs office today.  Sutures were removed.  Overall, his shoulder incision appears to be doing well.  The main concern is that he has lost about 25 pounds in the 3 weeks he was at the nursing home.  He reports the food was terrible and that he did not eat but they are also concerned that something else is going on.  He also has chronic SOB  from his many years of tobacco use.    Please see A&P for the details of today's visit and status of the patient's medical problems.   Patient's Medications  New Prescriptions   No medications on file  Previous Medications   ACETAMINOPHEN (TYLENOL 8 HOUR) 650 MG CR TABLET    Take 1 tablet (650 mg total) by mouth every 8 (eight) hours as needed for pain.   ALLOPURINOL (ZYLOPRIM) 100 MG TABLET    Take 1 tablet every day by oral route.   AMLODIPINE (NORVASC) 5 MG TABLET    Take 1 tablet every day by oral route.   ASPIRIN (ASPIRIN CHILDRENS) 81 MG CHEWABLE TABLET    Chew 1 tablet (81 mg total) by mouth 2 (two) times daily. For 6 weeks for DVT prophylaxis after surgery   BISACODYL (DULCOLAX) 10 MG SUPPOSITORY    Place 1 suppository (10 mg total) rectally daily as needed for moderate constipation.   CELECOXIB (CELEBREX) 100 MG CAPSULE    Take 100 mg by mouth 2 (two) times daily.   FOLIC ACID (FOLVITE) 1 MG TABLET    Take 1 mg by mouth daily.   METFORMIN (GLUCOPHAGE) 500 MG TABLET    Take 500 mg by mouth 2 (two) times daily.   METHOCARBAMOL (ROBAXIN) 500 MG TABLET    Take 1  tablet (500 mg total) by mouth every 6 (six) hours as needed for muscle spasms.   POLYETHYLENE GLYCOL (MIRALAX / GLYCOLAX) 17 G PACKET    Take 17 g by mouth 2 (two) times daily.   ROSUVASTATIN (CRESTOR) 20 MG TABLET    Take 1 tablet (20 mg total) by mouth daily. Hold while on daptomycin   SENNA-DOCUSATE (SENOKOT-S) 8.6-50 MG TABLET    Take 2 tablets by mouth 2 (two) times daily.   THIAMINE (VITAMIN B-1) 100 MG TABLET    Take 100 mg by mouth daily.  Modified Medications   Modified Medication Previous Medication   CEFADROXIL (DURICEF) 500 MG CAPSULE cefadroxil (DURICEF) 500 MG capsule      Take 2 capsules (1,000 mg total) by mouth 2 (two) times daily.    Take 2 capsules (1,000 mg total) by mouth 2 (two) times daily.   DOXYCYCLINE (VIBRA-TABS) 100 MG TABLET doxycycline (VIBRA-TABS) 100 MG tablet      Take 1 tablet (100 mg total)  by mouth 2 (two) times daily.    Take 1 tablet (100 mg total) by mouth 2 (two) times daily.  Discontinued Medications   AMLODIPINE (NORVASC) 10 MG TABLET    Take 1 tablet (10 mg total) by mouth daily.   CEFTRIAXONE (ROCEPHIN) IVPB    Inject 2 g into the vein daily. Indication:  R shoulder PJI First Dose: Yes Last Day of Therapy:  06/22/22  Labs - Once weekly:  CBC/D and BMP, Labs - Every other week:  ESR and CRP   DAPTOMYCIN (CUBICIN) IVPB    Inject 500 mg into the vein daily. 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0 standard drinks of alcohol    Types: 42 Cans of beer per week    Comment: quit june 4th   Drug use: No    Family History  Problem Relation Age of Onset   Heart disease Mother    Heart disease Father    Diabetes Father    Nephrolithiasis Paternal Grandfather    Kidney disease Neg Hx    Prostate cancer Neg Hx     No Known Allergies  Review of Systems  All other systems reviewed and are negative.  Except as noted above.   OBJECTIVE:  Vitals:    06/03/22 1531  BP: (!) 157/88  Pulse: 81  Resp: 16  SpO2: 92%  Weight: 128 lb (58.1 kg)  Height: $Remove'5\' 6"'JLLpUtF$  (1.676 m)   Body mass index is 20.66 kg/m.  Physical Exam Constitutional:      General: He is not in acute distress.    Appearance: Normal appearance.  HENT:     Head: Normocephalic and atraumatic.  Eyes:     Extraocular Movements: Extraocular movements intact.     Conjunctiva/sclera: Conjunctivae normal.  Abdominal:     General: There is no distension.     Palpations: Abdomen is soft.  Musculoskeletal:     Comments: Right shoulder incision well approximated.  No drainage.  Left PICC line removed.   Skin:    General: Skin is warm and dry.  Neurological:     General: No focal deficit present.     Mental Status: He is alert and oriented to person, place, and time.  Psychiatric:        Mood and Affect: Mood normal.        Behavior: Behavior normal.      Labs and Microbiology:    Latest Ref Rng & Units 05/13/2022    2:42 AM 05/12/2022    2:42 AM 05/11/2022    8:24 PM  CBC  WBC 4.0 - 10.5 K/uL 18.9  10.1  13.2   Hemoglobin 13.0 - 17.0 g/dL 7.4  9.0  9.8   Hematocrit 39.0 - 52.0 % 23.5  28.9  31.0   Platelets 150 - 400 K/uL 351  424  462       Latest Ref Rng & Units 05/14/2022    4:27 AM 05/13/2022   12:50 PM 05/12/2022    2:42 AM  CMP  Glucose 70 - 99 mg/dL   130   BUN 8 - 23 mg/dL   31   Creatinine 0.61 - 1.24 mg/dL 1.72  2.37  2.09   Sodium 135 - 145 mmol/L   135   Potassium 3.5 - 5.1 mmol/L   4.4   Chloride 98 - 111 mmol/L   98   CO2 22 - 32 mmol/L   26   Calcium 8.9 - 10.3 mg/dL   9.0       ASSESSMENT & PLAN:    Septic arthritis of shoulder, right (HCC) Will continue with current antibiotics of cefadroxil 1 gm BID and doxycycline $RemoveBeforeD'100mg'CRbpOtAJEcYcVt$  BID given issues with trying to do IV antibiotics at home.  I sent in new prescriptions to his pharmacy in [place] as [facility].    He will complete 6  weeks of initial antibioitics on ~ 06/22/22.  Given that he essentially underwent a 1-stage procedure will then plan to continue this regimen through  08/03/22 as recent data has shown 12 weeks to be greater than 6 weeks for this type of procedure.  Following this, he may then require suppressive therapy as there are no plans for further surgery and relapsed infection would likely be detrimental for him.  Will check labs today.  Follow up in 4 weeks.   Streptococcal bacteremia Patient had Strep gallolyticus bacteremia in January 2023.  Was advised to have colonoscopy following this but does not appear to have been done.  Combined with his significant weight loss raises concern for a GI malignancy.  I have referred to GI for this purpose.  They are interested in seeing someone  in [place] if possible.    Orders Placed This Encounter  Procedures   CBC w/Diff   Basic Metabolic Panel (BMET)   Sedimentation rate   C-reactive protein   Ambulatory referral to Gastroenterology    Referral Priority:   Urgent    Referral Type:   Consultation    Referral Reason:   Specialty Services Required    Number of Visits Requested:   [facility] 06/03/2022, 4:40 PM  I have personally spent 40 minutes involved in face-to-face and non-face-to-face activities for this patient on the day of the visit. Professional time spent includes the following activities: Preparing to see the patient (review of tests), Obtaining and/or reviewing separately obtained history (admission/discharge record), Performing a medically appropriate examination and/or evaluation , Ordering medications/tests/procedures, referring and communicating with other health care professionals, Documenting clinical information in the EMR, Independently interpreting results (not separately reported), Communicating results to the patient/family/caregiver, Counseling and  educating the patient/family/caregiver and Care coordination (not separately reported).

## 2022-06-03 NOTE — Assessment & Plan Note (Signed)
Patient had Strep gallolyticus bacteremia in January 2023.  Was advised to have colonoscopy following this but does not appear to have been done.  Combined with his significant weight loss raises concern for a GI malignancy.  I have referred to GI for this purpose.  They are interested in seeing someone in [place] if possible.

## 2022-06-03 NOTE — Assessment & Plan Note (Signed)
Will continue with current antibiotics of cefadroxil 1 gm BID and doxycycline '100mg'$  BID given issues with trying to do IV antibiotics at home.  I sent in new prescriptions to his pharmacy in [place] as [facility].    He will complete 6 weeks of initial antibioitics on ~ 06/22/22.  Given that he essentially underwent a 1-stage procedure will then plan to continue this regimen through  08/03/22 as recent data has shown 12 weeks to be greater than 6 weeks for this type of procedure.  Following this, he may then require suppressive therapy as there are no plans for further surgery and relapsed infection would likely be detrimental for him.  Will check labs today.  Follow up in 4 weeks.

## 2022-06-04 LAB — CBC WITH DIFFERENTIAL/PLATELET
Absolute Monocytes: 1526 cells/uL — ABNORMAL HIGH (ref 200–950)
Basophils Absolute: 64 cells/uL (ref 0–200)
Basophils Relative: 0.4 %
Eosinophils Absolute: 922 cells/uL — ABNORMAL HIGH (ref 15–500)
Eosinophils Relative: 5.8 %
HCT: 25.4 % — ABNORMAL LOW (ref 38.5–50.0)
Hemoglobin: 8.3 g/dL — ABNORMAL LOW (ref 13.2–17.1)
Lymphs Abs: 1177 cells/uL (ref 850–3900)
MCH: 28.5 pg (ref 27.0–33.0)
MCHC: 32.7 g/dL (ref 32.0–36.0)
MCV: 87.3 fL (ref 80.0–100.0)
MPV: 10 fL (ref 7.5–12.5)
Monocytes Relative: 9.6 %
Neutro Abs: 12211 cells/uL — ABNORMAL HIGH (ref 1500–7800)
Neutrophils Relative %: 76.8 %
Platelets: 795 10*3/uL — ABNORMAL HIGH (ref 140–400)
RBC: 2.91 10*6/uL — ABNORMAL LOW (ref 4.20–5.80)
RDW: 15.1 % — ABNORMAL HIGH (ref 11.0–15.0)
Total Lymphocyte: 7.4 %
WBC: 15.9 10*3/uL — ABNORMAL HIGH (ref 3.8–10.8)

## 2022-06-04 LAB — BASIC METABOLIC PANEL
BUN/Creatinine Ratio: 14 (calc) (ref 6–22)
BUN: 31 mg/dL — ABNORMAL HIGH (ref 7–25)
CO2: 24 mmol/L (ref 20–32)
Calcium: 9.4 mg/dL (ref 8.6–10.3)
Chloride: 96 mmol/L — ABNORMAL LOW (ref 98–110)
Creat: 2.23 mg/dL — ABNORMAL HIGH (ref 0.70–1.28)
Glucose, Bld: 156 mg/dL — ABNORMAL HIGH (ref 65–99)
Potassium: 4.5 mmol/L (ref 3.5–5.3)
Sodium: 135 mmol/L (ref 135–146)

## 2022-06-04 LAB — C-REACTIVE PROTEIN: CRP: 311.7 mg/L — ABNORMAL HIGH (ref <8.0)

## 2022-06-04 LAB — SEDIMENTATION RATE: Sed Rate: 79 mm/h — ABNORMAL HIGH (ref 0–20)

## 2022-06-08 ENCOUNTER — Telehealth: Payer: Self-pay

## 2022-06-08 NOTE — Telephone Encounter (Signed)
Liz Malady, NP with [facility] called to let our office know the patient's blood culture results were negative and showed no growth. Faith will also fax results to our office for our records.  Edward Singleton T Brooks Sailors

## 2022-06-20 ENCOUNTER — Other Ambulatory Visit: Payer: Self-pay

## 2022-06-20 ENCOUNTER — Ambulatory Visit (INDEPENDENT_AMBULATORY_CARE_PROVIDER_SITE_OTHER): Payer: Medicare Other | Admitting: Gastroenterology

## 2022-06-20 ENCOUNTER — Encounter: Payer: Self-pay | Admitting: Gastroenterology

## 2022-06-20 VITALS — BP 178/80 | HR 78 | Temp 97.9°F | Ht 64.5 in | Wt 126.8 lb

## 2022-06-20 DIAGNOSIS — I25118 Atherosclerotic heart disease of native coronary artery with other forms of angina pectoris: Secondary | ICD-10-CM | POA: Diagnosis not present

## 2022-06-20 DIAGNOSIS — R7881 Bacteremia: Secondary | ICD-10-CM

## 2022-06-20 DIAGNOSIS — R634 Abnormal weight loss: Secondary | ICD-10-CM | POA: Diagnosis not present

## 2022-06-20 DIAGNOSIS — B955 Unspecified streptococcus as the cause of diseases classified elsewhere: Secondary | ICD-10-CM

## 2022-06-20 NOTE — Progress Notes (Unsigned)
Jonathon Bellows MD, MRCP(U.K) [address]  [place]  [place], [place] [address]  Main: [phone]  Fax: [phone]   Gastroenterology Consultation  Referring Provider:     Dr. Juleen China Primary Care Physician:  Windy Fast, MD Primary Gastroenterologist:  Dr. Jonathon Bellows  Reason for Consultation:    Colonoscopy in the setting of Streptococcus Gallolyticus bacteremia        HPI:   Edward Singleton is a 71 y.o. y/o male referred for consultation & management  by Dr. Dr. Juleen China.  He underwent arthroplasty in January 2023 to have his implants removed as he developed Streptococcus  bacteremia.  Treated with IV antibiotics.  He has been referred to me to undergo a colonoscopy due to unintentional weight loss and rule out a mass in the colon that might have precipitated bacteremia.  He is today here with his sister with his healthcare power of attorney holder.  The patient denies any symptoms.  Denies any change in bowel habits no constipation no diarrhea no abdominal pain.  Has lost weight but he blames it to the nature of the food that he was served at the facility he was which was not appetizing.   Past Medical History:  Diagnosis Date   Alcohol abuse    Atrophic kidney    Benign enlargement of prostate    Coronary artery disease    Dementia ([facility])    due to alcohol   Diabetes mellitus without complication ([facility])    Gout    Gout    Headache    Hypercholesteremia    Hypertension    Kidney stones    Left flank pain, chronic    Pelvic fracture ([facility]) 11/17/2014   Pneumonia    Prolonged QT syndrome    Septic arthritis of shoulder, right ([facility]) 05/11/2022   White matter disease     Past Surgical History:  Procedure Laterality Date   ESOPHAGOGASTRODUODENOSCOPY Left 03/18/2019   Procedure: ESOPHAGOGASTRODUODENOSCOPY (EGD);  Surgeon: Jonathon Bellows, MD;  Location: [facility] ENDOSCOPY;  Service: Gastroenterology;  Laterality: Left;   left renal stent placement  2013   LITHOTRIPSY     ORIF  HUMERUS FRACTURE Left 11/18/2014   Procedure: OPEN REDUCTION INTERNAL FIXATION (ORIF) DISTAL HUMERUS FRACTURE;  Surgeon: Rozanna Box, MD;  Location: [facility];  Service: Orthopedics;  Laterality: Left;   REVERSE SHOULDER ARTHROPLASTY Right 10/27/2021   Procedure: REVERSE SHOULDER ARTHROPLASTY;  Surgeon: Hiram Gash, MD;  Location: [facility];  Service: Orthopedics;  Laterality: Right;   REVISION TOTAL SHOULDER TO REVERSE TOTAL SHOULDER Right 05/11/2022   Procedure: REVISION TOTAL SHOULDER TO REVERSE TOTAL SHOULDER;  Surgeon: Hiram Gash, MD;  Location: [facility];  Service: Orthopedics;  Laterality: Right;   SPLENECTOMY      Prior to Admission medications   Medication Sig Start Date End Date Taking? Authorizing Provider  acetaminophen (TYLENOL 8 HOUR) 650 MG CR tablet Take 1 tablet (650 mg total) by mouth every 8 (eight) hours as needed for pain. 05/13/22   McBane, Maylene Roes, PA-C  allopurinol (ZYLOPRIM) 100 MG tablet Take 1 tablet every day by oral route.    [provider]  amLODipine (NORVASC) 5 MG tablet Take 1 tablet every day by oral route.    [provider]  aspirin (ASPIRIN CHILDRENS) 81 MG chewable tablet Chew 1 tablet (81 mg total) by mouth 2 (two) times daily. For 6 weeks for DVT prophylaxis after surgery 05/13/22 06/24/22  Ethelda Chick, PA-C  bisacodyl (  DULCOLAX) 10 MG suppository Place 1 suppository (10 mg total) rectally daily as needed for moderate constipation. 11/02/21   Nolberto Hanlon, MD  cefadroxil (DURICEF) 500 MG capsule Take 2 capsules (1,000 mg total) by mouth 2 (two) times daily. 06/03/22   Mignon Pine, DO  celecoxib (CELEBREX) 100 MG capsule Take 100 mg by mouth 2 (two) times daily. 04/27/22   [provider]  doxycycline (VIBRA-TABS) 100 MG tablet Take 1 tablet (100 mg total) by mouth 2 (two) times daily. 06/03/22   Mignon Pine, DO  folic acid (FOLVITE) 1 MG tablet Take 1 mg by mouth daily.    [provider]  metFORMIN (GLUCOPHAGE)  500 MG tablet Take 500 mg by mouth 2 (two) times daily. 06/04/19   [provider]  methocarbamol (ROBAXIN) 500 MG tablet Take 1 tablet (500 mg total) by mouth every 6 (six) hours as needed for muscle spasms. Patient not taking: Reported on 05/09/2022 11/02/21   Nolberto Hanlon, MD  polyethylene glycol (MIRALAX / GLYCOLAX) 17 g packet Take 17 g by mouth 2 (two) times daily. Patient not taking: Reported on 05/09/2022 11/02/21   Nolberto Hanlon, MD  rosuvastatin (CRESTOR) 20 MG tablet Take 1 tablet (20 mg total) by mouth daily. Hold while on daptomycin 06/23/22   McBane, Maylene Roes, PA-C  senna-docusate (SENOKOT-S) 8.6-50 MG tablet Take 2 tablets by mouth 2 (two) times daily. 11/02/21   Nolberto Hanlon, MD  thiamine (VITAMIN B-1) 100 MG tablet Take 100 mg by mouth daily.    [provider]    Family History  Problem Relation Age of Onset   Heart disease Mother    Heart disease Father    Diabetes Father    Nephrolithiasis Paternal Grandfather    Kidney disease Neg Hx    Prostate cancer Neg Hx      Social History   Tobacco Use   Smoking status: Every Day    Packs/day: 1.00    Years: 50.00    Total pack years: 50.00    Types: Cigarettes   Smokeless tobacco: Former    Types: Nurse, children's Use: Never used  Substance Use Topics   Alcohol use: Not Currently    Alcohol/week: 42.0 standard drinks of alcohol    Types: 42 Cans of beer per week    Comment: quit june 4th   Drug use: No    Allergies as of 06/20/2022   (No Known Allergies)    Review of Systems:    All systems reviewed and negative except where noted in HPI.   Physical Exam:  BP (!) 178/80   Pulse 78   Temp 97.9 F (36.6 C) (Oral)   Ht 5' 4.5" (1.638 m)   Wt 126 lb 12.8 oz (57.5 kg)   BMI 21.43 kg/m  No LMP for male patient. Psych:  Alert and cooperative. Normal mood and affect. General:   Alert,  Well-developed, well-nourished, pleasant and cooperative in NAD Head:  Normocephalic and  atraumatic. Eyes:  Sclera clear, no icterus.   Conjunctiva pink. Ears:  Normal auditory acuity. Neurologic:  Alert and oriented x3;  grossly normal neurologically. Psych:  Alert and cooperative. Normal mood and affect.  Imaging Studies: No results found.  Assessment and Plan:   Edward Singleton is a 71 y.o. y/o male has been referred for a colonoscopy after the patient had an episode of streptococcal bacteremia after joint replacement.  He also has unintentional weight loss. Part of the  problem for the unintentional weight loss may be poor oral intake as he found the food and appetizing at the center he was staying at.  Obviously other reasons need to be evaluated in view of his age such as malignancy   Plan  EGD+colonoscopy and if no cause for weight loss found and continues to lose weight can consider getting CT chest/abdomen/pelvis with DR Juleen China or Windy Fast, MD to check for malignancies outside the GI tract .   I have discussed alternative options, risks & benefits,  which include, but are not limited to, bleeding, infection, perforation,respiratory complication & drug reaction.  The patient agrees with this plan & written consent will be obtained.     Follow up in as needed  Dr Jonathon Bellows MD,MRCP(U.K)

## 2022-06-22 ENCOUNTER — Telehealth: Payer: Self-pay | Admitting: Internal Medicine

## 2022-06-22 NOTE — Telephone Encounter (Signed)
Edward Singleton's sister, [name], wanted to make sure you knew she was very appreciative of everything you have done for them. [facility] but he has been treated by their Infectious Disease clinic. She thanks you and is grateful for the care they received from you.

## 2022-06-30 ENCOUNTER — Other Ambulatory Visit: Payer: Self-pay | Admitting: Internal Medicine

## 2022-06-30 ENCOUNTER — Telehealth: Payer: Self-pay

## 2022-06-30 DIAGNOSIS — T8450XD Infection and inflammatory reaction due to unspecified internal joint prosthesis, subsequent encounter: Secondary | ICD-10-CM

## 2022-06-30 NOTE — Telephone Encounter (Signed)
Called patient's sister regarding refill request for Doxy. [name] called 9/13 stating [facility] would be [facility]. Called to confirm patient has been seen by [facility] ID clinic and refills have been sent into pharmacy. [name] was able to confirm this.  Would like to voice appreciation for Dr. Alcario Drought care. Would also like to inform him that Theo is scheduled for colonoscopy next week. Leatrice Jewels, RMA

## 2022-07-01 ENCOUNTER — Ambulatory Visit: Payer: Medicare Other | Admitting: Internal Medicine

## 2022-07-05 ENCOUNTER — Encounter: Payer: Self-pay | Admitting: Gastroenterology

## 2022-07-06 ENCOUNTER — Encounter
Admission: RE | Disposition: A | Discharge status: Home / Self Care | Payer: Self-pay | Source: Home / Self Care | Attending: Gastroenterology

## 2022-07-06 ENCOUNTER — Ambulatory Visit: Payer: Medicare Other | Admitting: Anesthesiology

## 2022-07-06 ENCOUNTER — Encounter: Payer: Self-pay | Admitting: Gastroenterology

## 2022-07-06 ENCOUNTER — Ambulatory Visit
Admission: RE | Admit: 2022-07-06 | Discharge: 2022-07-06 | Disposition: A | Discharge status: Home / Self Care | Payer: Medicare Other | Attending: Gastroenterology | Admitting: Gastroenterology

## 2022-07-06 DIAGNOSIS — B955 Unspecified streptococcus as the cause of diseases classified elsewhere: Secondary | ICD-10-CM

## 2022-07-06 DIAGNOSIS — I10 Essential (primary) hypertension: Secondary | ICD-10-CM | POA: Diagnosis not present

## 2022-07-06 DIAGNOSIS — K449 Diaphragmatic hernia without obstruction or gangrene: Secondary | ICD-10-CM | POA: Diagnosis not present

## 2022-07-06 DIAGNOSIS — E119 Type 2 diabetes mellitus without complications: Secondary | ICD-10-CM | POA: Diagnosis not present

## 2022-07-06 DIAGNOSIS — I2510 Atherosclerotic heart disease of native coronary artery without angina pectoris: Secondary | ICD-10-CM | POA: Insufficient documentation

## 2022-07-06 DIAGNOSIS — F17210 Nicotine dependence, cigarettes, uncomplicated: Secondary | ICD-10-CM | POA: Insufficient documentation

## 2022-07-06 DIAGNOSIS — K5730 Diverticulosis of large intestine without perforation or abscess without bleeding: Secondary | ICD-10-CM | POA: Insufficient documentation

## 2022-07-06 DIAGNOSIS — D120 Benign neoplasm of cecum: Secondary | ICD-10-CM | POA: Diagnosis not present

## 2022-07-06 DIAGNOSIS — D126 Benign neoplasm of colon, unspecified: Secondary | ICD-10-CM | POA: Diagnosis not present

## 2022-07-06 DIAGNOSIS — R634 Abnormal weight loss: Secondary | ICD-10-CM | POA: Diagnosis present

## 2022-07-06 DIAGNOSIS — F0390 Unspecified dementia without behavioral disturbance: Secondary | ICD-10-CM | POA: Diagnosis not present

## 2022-07-06 DIAGNOSIS — Z7984 Long term (current) use of oral hypoglycemic drugs: Secondary | ICD-10-CM | POA: Diagnosis not present

## 2022-07-06 DIAGNOSIS — I739 Peripheral vascular disease, unspecified: Secondary | ICD-10-CM | POA: Diagnosis not present

## 2022-07-06 HISTORY — PX: ESOPHAGOGASTRODUODENOSCOPY: SHX5428

## 2022-07-06 HISTORY — PX: COLONOSCOPY WITH PROPOFOL: SHX5780

## 2022-07-06 SURGERY — COLONOSCOPY WITH PROPOFOL
Anesthesia: General

## 2022-07-06 MED ORDER — PROPOFOL 1000 MG/100ML IV EMUL
INTRAVENOUS | Status: AC
  Filled 2022-07-06: qty 100

## 2022-07-06 MED ORDER — PROPOFOL 10 MG/ML IV BOLUS
INTRAVENOUS | Status: DC | PRN
  Administered 2022-07-06: 30 mg via INTRAVENOUS
  Administered 2022-07-06: 50 mg via INTRAVENOUS
  Administered 2022-07-06: 20 mg via INTRAVENOUS

## 2022-07-06 MED ORDER — LIDOCAINE HCL (CARDIAC) PF 100 MG/5ML IV SOSY
PREFILLED_SYRINGE | INTRAVENOUS | Status: DC | PRN
  Administered 2022-07-06: 50 mg via INTRAVENOUS

## 2022-07-06 MED ORDER — STERILE WATER FOR IRRIGATION IR SOLN
Status: DC | PRN
  Administered 2022-07-06: 60 mL

## 2022-07-06 MED ORDER — PROPOFOL 500 MG/50ML IV EMUL
INTRAVENOUS | Status: DC | PRN
  Administered 2022-07-06: 150 ug/kg/min via INTRAVENOUS

## 2022-07-06 MED ORDER — MIDAZOLAM HCL 2 MG/2ML IJ SOLN
INTRAMUSCULAR | Status: DC | PRN
  Administered 2022-07-06: 2 mg via INTRAVENOUS

## 2022-07-06 MED ORDER — SODIUM CHLORIDE 0.9 % IV SOLN: INTRAVENOUS | Status: DC

## 2022-07-06 MED ORDER — KETAMINE HCL 50 MG/5ML IJ SOSY
PREFILLED_SYRINGE | INTRAMUSCULAR | Status: AC
  Filled 2022-07-06: qty 5

## 2022-07-06 MED ORDER — MIDAZOLAM HCL 2 MG/2ML IJ SOLN
INTRAMUSCULAR | Status: AC
  Filled 2022-07-06: qty 2

## 2022-07-06 NOTE — H&P (Signed)
Jonathon Bellows, MD [address] [place], [place], [place], [address] [name] [place], [place], [place], [address] Phone: [phone]  Fax: [phone]  Primary Care Physician:  Windy Fast, MD   Pre-Procedure History & Physical: HPI:  Edward Singleton is a 71 y.o. male is here for an endoscopy and colonoscopy    Past Medical History:  Diagnosis Date   Alcohol abuse    Atrophic kidney    Benign enlargement of prostate    Coronary artery disease    Dementia ([place])    due to alcohol   Diabetes mellitus without complication ([place])    Gout    Gout    Headache    Hypercholesteremia    Hypertension    Kidney stones    Left flank pain, chronic    Pelvic fracture ([place]) 11/17/2014   Pneumonia    Prolonged QT syndrome    Septic arthritis of shoulder, right ([place]) 05/11/2022   White matter disease     Past Surgical History:  Procedure Laterality Date   ESOPHAGOGASTRODUODENOSCOPY Left 03/18/2019   Procedure: ESOPHAGOGASTRODUODENOSCOPY (EGD);  Surgeon: Jonathon Bellows, MD;  Location: [facility] ENDOSCOPY;  Service: Gastroenterology;  Laterality: Left;   FRACTURE SURGERY     left renal stent placement  10/11/2011   LITHOTRIPSY     ORIF HUMERUS FRACTURE Left 11/18/2014   Procedure: OPEN REDUCTION INTERNAL FIXATION (ORIF) DISTAL HUMERUS FRACTURE;  Surgeon: Rozanna Box, MD;  Location: [place];  Service: Orthopedics;  Laterality: Left;   REVERSE SHOULDER ARTHROPLASTY Right 10/27/2021   Procedure: REVERSE SHOULDER ARTHROPLASTY;  Surgeon: Hiram Gash, MD;  Location: [facility];  Service: Orthopedics;  Laterality: Right;   REVISION TOTAL SHOULDER TO REVERSE TOTAL SHOULDER Right 05/11/2022   Procedure: REVISION TOTAL SHOULDER TO REVERSE TOTAL SHOULDER;  Surgeon: Hiram Gash, MD;  Location: [facility];  Service: Orthopedics;  Laterality: Right;   SPLENECTOMY      Prior to Admission medications   Medication Sig Start Date End Date Taking? Authorizing Provider  cefadroxil (DURICEF) 500 MG  capsule Take 2 capsules (1,000 mg total) by mouth 2 (two) times daily. 06/03/22  Yes Mignon Pine, DO  folic acid (FOLVITE) 1 MG tablet Take 1 mg by mouth daily.   Yes [provider]  metFORMIN (GLUCOPHAGE) 500 MG tablet Take 500 mg by mouth 2 (two) times daily. 06/04/19  Yes [provider]  rosuvastatin (CRESTOR) 20 MG tablet Take 1 tablet (20 mg total) by mouth daily. Hold while on daptomycin 06/23/22  Yes McBane, Maylene Roes, PA-C  thiamine (VITAMIN B-1) 100 MG tablet Take 100 mg by mouth daily.   Yes [provider]  celecoxib (CELEBREX) 100 MG capsule Take 100 mg by mouth 2 (two) times daily. 04/27/22   [provider]  doxycycline (VIBRA-TABS) 100 MG tablet Take 1 tablet (100 mg total) by mouth 2 (two) times daily. Patient not taking: Reported on 07/06/2022 06/03/22   Mignon Pine, DO    Allergies as of 06/21/2022   (No Known Allergies)    Family History  Problem Relation Age of Onset   Heart disease Mother    Heart disease Father    Diabetes Father    Nephrolithiasis Paternal Grandfather    Kidney disease Neg Hx    Prostate cancer Neg Hx     Social History   Socioeconomic History   Marital status: Single    Spouse name: Not on file   Number of children: Not  on file   Years of education: Not on file   Highest education level: Not on file  Occupational History   Not on file  Tobacco Use   Smoking status: Every Day    Packs/day: 1.00    Years: 50.00    Total pack years: 50.00    Types: Cigarettes   Smokeless tobacco: Former    Types: Nurse, children's Use: Never used  Substance and Sexual Activity   Alcohol use: Not Currently    Alcohol/week: 42.0 standard drinks of alcohol    Types: 42 Cans of beer per week    Comment: quit june 4th   Drug use: No   Sexual activity: Not on file  Other Topics Concern   Not on file  Social History Narrative   Not on file   Social Determinants of Health   Financial Resource  Strain: Not on file  Food Insecurity: Not on file  Transportation Needs: Not on file  Physical Activity: Not on file  Stress: Not on file  Social Connections: Not on file  Intimate Partner Violence: Not on file    Review of Systems: See HPI, otherwise negative ROS  Physical Exam: BP (!) 156/80   Pulse 79   Temp (!) 96.7 F (35.9 C)   Resp 16   Ht '5\' 6"'$  (1.676 m)   Wt 59.1 kg   BMI 21.03 kg/m  General:   Alert,  pleasant and cooperative in NAD Head:  Normocephalic and atraumatic. Neck:  Supple; no masses or thyromegaly. Lungs:  Clear throughout to auscultation, normal respiratory effort.    Heart:  +S1, +S2, Regular rate and rhythm, No edema. Abdomen:  Soft, nontender and nondistended. Normal bowel sounds, without guarding, and without rebound.   Neurologic:  Alert and  oriented x4;  grossly normal neurologically.  Impression/Plan: Edward Singleton is here for an endoscopy and colonoscopy  to be performed for  evaluation of weight loss.     Risks, benefits, limitations, and alternatives regarding endoscopy have been reviewed with the patient.  Questions have been answered.  All parties agreeable.   Jonathon Bellows, MD  07/06/2022, 8:26 AM

## 2022-07-06 NOTE — Anesthesia Postprocedure Evaluation (Signed)
Anesthesia Post Note  Patient: Edward Singleton  Procedure(s) Performed: COLONOSCOPY WITH PROPOFOL ESOPHAGOGASTRODUODENOSCOPY (EGD)  Patient location during evaluation: Endoscopy Anesthesia Type: General Level of consciousness: awake and alert Pain management: pain level controlled Vital Signs Assessment: post-procedure vital signs reviewed and stable Respiratory status: spontaneous breathing, nonlabored ventilation and respiratory function stable Cardiovascular status: blood pressure returned to baseline and stable Postop Assessment: no apparent nausea or vomiting Anesthetic complications: no   No notable events documented.   Last Vitals:  Vitals:   07/06/22 0903 07/06/22 0918  BP:  (!) 149/70  Pulse:    Resp:    Temp: (!) 35.9 C     Last Pain:  Vitals:   07/06/22 0918  TempSrc:   PainSc: 0-No pain                 [place] Ouch

## 2022-07-06 NOTE — Op Note (Signed)
[facility] Gastroenterology Patient Name: Edward Singleton Procedure Date: 07/06/2022 8:28 AM MRN: [id number] Account #: [id number] Date of Birth: 1951/03/21 Admit Type: Outpatient Age: 71 Room: [facility] ENDO ROOM 3 Gender: Male Note Status: Finalized Instrument Name: Upper Endoscope [id number] Procedure:             Upper GI endoscopy Indications:           Weight loss Providers:             Jonathon Bellows MD, MD Referring MD:          Windy Fast (Referring MD) Medicines:             Monitored Anesthesia Care Complications:         No immediate complications. Procedure:             Pre-Anesthesia Assessment:                        - Prior to the procedure, a History and Physical was                         performed, and patient medications, allergies and                         sensitivities were reviewed. The patient's tolerance                         of previous anesthesia was reviewed.                        - The risks and benefits of the procedure and the                         sedation options and risks were discussed with the                         patient. All questions were answered and informed                         consent was obtained.                        - ASA Grade Assessment: II - A patient with mild                         systemic disease.                        After obtaining informed consent, the endoscope was                         passed under direct vision. Throughout the procedure,                         the patient's blood pressure, pulse, and oxygen                         saturations were monitored continuously. The Endoscope                         was introduced through  the mouth, and advanced to the                         third part of duodenum. The upper GI endoscopy was                         accomplished with ease. The patient tolerated the                         procedure well. Findings:      A medium-sized hiatal hernia was  present.      The esophagus was normal.      The examined duodenum was normal.      The cardia and gastric fundus were normal on retroflexion. Impression:            - Medium-sized hiatal hernia.                        - Normal esophagus.                        - Normal examined duodenum.                        - No specimens collected. Recommendation:        - Perform a colonoscopy today. Procedure Code(s):     --- Professional ---                        [phone], Esophagogastroduodenoscopy, flexible,                         transoral; diagnostic, including collection of                         specimen(s) by brushing or washing, when performed                         (separate procedure) Diagnosis Code(s):     --- Professional ---                        K44.9, Diaphragmatic hernia without obstruction or                         gangrene                        R63.4, Abnormal weight loss CPT copyright 2019 American Medical Association. All rights reserved. The codes documented in this report are preliminary and upon coder review may  be revised to meet current compliance requirements. Jonathon Bellows, MD Jonathon Bellows MD, MD 07/06/2022 8:45:29 AM This report has been signed electronically. Number of Addenda: 0 Note Initiated On: 07/06/2022 8:28 AM Estimated Blood Loss:  Estimated blood loss: none.      [facility]

## 2022-07-06 NOTE — Anesthesia Preprocedure Evaluation (Addendum)
Anesthesia Evaluation  Patient identified by MRN, date of birth, ID band Patient awake    Reviewed: Allergy & Precautions, NPO status , Patient's Chart, lab work & pertinent test results  Airway Mallampati: II  TM Distance: >3 FB Neck ROM: Full    Dental no notable dental hx.    Pulmonary Current Smoker and Patient abstained from smoking.,    Pulmonary exam normal breath sounds clear to auscultation       Cardiovascular hypertension, Pt. on medications + CAD and + Peripheral Vascular Disease  Normal cardiovascular exam Rhythm:Regular Rate:Normal  Untreated HTN   Neuro/Psych Dementia negative neurological ROS     GI/Hepatic negative GI ROS, Neg liver ROS,   Endo/Other  diabetes, Type 2, Oral Hypoglycemic Agents  Renal/GU Renal InsufficiencyRenal disease  negative genitourinary   Musculoskeletal  (+) Arthritis ,   Abdominal   Peds negative pediatric ROS (+)  Hematology  (+) Blood dyscrasia, anemia , thrombocytosis   Anesthesia Other Findings   Reproductive/Obstetrics negative OB ROS                            Anesthesia Physical  Anesthesia Plan  ASA: 3  Anesthesia Plan: General   Post-op Pain Management: Minimal or no pain anticipated   Induction: Intravenous  PONV Risk Score and Plan: 1 and Propofol infusion and TIVA  Airway Management Planned: Natural Airway  Additional Equipment:   Intra-op Plan:   Post-operative Plan:   Informed Consent: I have reviewed the patients History and Physical, chart, labs and discussed the procedure including the risks, benefits and alternatives for the proposed anesthesia with the patient or authorized representative who has indicated his/her understanding and acceptance.     Dental advisory given  Plan Discussed with: CRNA and Surgeon  Anesthesia Plan Comments:        Anesthesia Quick Evaluation

## 2022-07-06 NOTE — Transfer of Care (Signed)
Immediate Anesthesia Transfer of Care Note  Patient: Edward Singleton  Procedure(s) Performed: COLONOSCOPY WITH PROPOFOL ESOPHAGOGASTRODUODENOSCOPY (EGD)  Patient Location: PACU  Anesthesia Type:MAC  Level of Consciousness: drowsy  Airway & Oxygen Therapy: Patient Spontanous Breathing  Post-op Assessment: Report given to RN and Post -op Vital signs reviewed and stable  Post vital signs: Reviewed and stable  Last Vitals:  Vitals Value Taken Time  BP 125/62 07/06/22 0903  Temp    Pulse 70 07/06/22 0904  Resp 31 07/06/22 0904  SpO2 95 % 07/06/22 0904  Vitals shown include unvalidated device data.  Last Pain: There were no vitals filed for this visit.       Complications: No notable events documented.

## 2022-07-06 NOTE — Op Note (Signed)
[facility] Gastroenterology Patient Name: Edward Singleton Procedure Date: 07/06/2022 8:27 AM MRN: [id number] Account #: [id number] Date of Birth: 30-Apr-1951 Admit Type: Outpatient Age: 71 Room: [facility] ENDO ROOM 3 Gender: Male Note Status: Finalized Instrument Name: Jasper Riling [id number] Procedure:             Colonoscopy Indications:           Weight loss Providers:             Jonathon Bellows MD, MD Referring MD:          Windy Fast (Referring MD) Medicines:             Monitored Anesthesia Care Complications:         No immediate complications. Procedure:             Pre-Anesthesia Assessment:                        - Prior to the procedure, a History and Physical was                         performed, and patient medications, allergies and                         sensitivities were reviewed. The patient's tolerance                         of previous anesthesia was reviewed.                        - The risks and benefits of the procedure and the                         sedation options and risks were discussed with the                         patient. All questions were answered and informed                         consent was obtained.                        - ASA Grade Assessment: II - A patient with mild                         systemic disease.                        After obtaining informed consent, the colonoscope was                         passed under direct vision. Throughout the procedure,                         the patient's blood pressure, pulse, and oxygen                         saturations were monitored continuously. The                         Colonoscope was introduced through the anus and  advanced to the the cecum, identified by the                         appendiceal orifice. The colonoscopy was performed                         with ease. The patient tolerated the procedure well.                         The quality of  the bowel preparation was excellent. Findings:      The perianal and digital rectal examinations were normal.      A 7 mm polyp was found in the cecum. The polyp was sessile. The polyp       was removed with a cold snare. Resection and retrieval were complete.      The exam was otherwise without abnormality on direct and retroflexion       views.      Multiple small-mouthed diverticula were found in the sigmoid colon.      The exam was otherwise without abnormality on direct and retroflexion       views. Impression:            - One 7 mm polyp in the cecum, removed with a cold                         snare. Resected and retrieved.                        - The examination was otherwise normal on direct and                         retroflexion views.                        - Diverticulosis in the sigmoid colon.                        - The examination was otherwise normal on direct and                         retroflexion views. Recommendation:        - Discharge patient to home (with escort).                        - Resume previous diet.                        - Continue present medications.                        - Await pathology results.                        - Repeat colonoscopy for surveillance based on                         pathology results. Procedure Code(s):     --- Professional ---                        [id number], Colonoscopy, flexible; with removal of  tumor(s), polyp(s), or other lesion(s) by snare                         technique Diagnosis Code(s):     --- Professional ---                        K63.5, Polyp of colon                        R63.4, Abnormal weight loss                        K57.30, Diverticulosis of large intestine without                         perforation or abscess without bleeding CPT copyright 2019 American Medical Association. All rights reserved. The codes documented in this report are preliminary and upon coder review may   be revised to meet current compliance requirements. Jonathon Bellows, MD Jonathon Bellows MD, MD 07/06/2022 9:01:22 AM This report has been signed electronically. Number of Addenda: 0 Note Initiated On: 07/06/2022 8:27 AM Scope Withdrawal Time: 0 hours 7 minutes 13 seconds  Total Procedure Duration: 0 hours 12 minutes 19 seconds  Estimated Blood Loss:  Estimated blood loss: none.      [facility]

## 2022-07-06 NOTE — Anesthesia Procedure Notes (Signed)
Date/Time: 07/06/2022 8:49 AM  Performed by: Beverely Low, CRNAPre-anesthesia Checklist: Patient identified, Emergency Drugs available, Patient being monitored, Suction available and Timeout performed Patient Re-evaluated:Patient Re-evaluated prior to induction Oxygen Delivery Method: Nasal cannula Induction Type: IV induction

## 2022-07-07 ENCOUNTER — Encounter: Payer: Self-pay | Admitting: Gastroenterology

## 2022-07-07 LAB — SURGICAL PATHOLOGY

## 2022-07-08 ENCOUNTER — Encounter: Payer: Self-pay | Admitting: Gastroenterology

## 2022-10-10 ENCOUNTER — Emergency Department: Payer: No Typology Code available for payment source

## 2022-10-10 ENCOUNTER — Other Ambulatory Visit: Payer: Self-pay

## 2022-10-10 DIAGNOSIS — Z87442 Personal history of urinary calculi: Secondary | ICD-10-CM

## 2022-10-10 DIAGNOSIS — E7800 Pure hypercholesterolemia, unspecified: Secondary | ICD-10-CM | POA: Diagnosis present

## 2022-10-10 DIAGNOSIS — F17210 Nicotine dependence, cigarettes, uncomplicated: Secondary | ICD-10-CM | POA: Diagnosis present

## 2022-10-10 DIAGNOSIS — J441 Chronic obstructive pulmonary disease with (acute) exacerbation: Secondary | ICD-10-CM | POA: Diagnosis not present

## 2022-10-10 DIAGNOSIS — E876 Hypokalemia: Secondary | ICD-10-CM | POA: Diagnosis not present

## 2022-10-10 DIAGNOSIS — J439 Emphysema, unspecified: Secondary | ICD-10-CM | POA: Diagnosis present

## 2022-10-10 DIAGNOSIS — I129 Hypertensive chronic kidney disease with stage 1 through stage 4 chronic kidney disease, or unspecified chronic kidney disease: Secondary | ICD-10-CM | POA: Diagnosis present

## 2022-10-10 DIAGNOSIS — I2510 Atherosclerotic heart disease of native coronary artery without angina pectoris: Secondary | ICD-10-CM | POA: Diagnosis present

## 2022-10-10 DIAGNOSIS — Z7984 Long term (current) use of oral hypoglycemic drugs: Secondary | ICD-10-CM

## 2022-10-10 DIAGNOSIS — F411 Generalized anxiety disorder: Secondary | ICD-10-CM | POA: Diagnosis present

## 2022-10-10 DIAGNOSIS — Z8249 Family history of ischemic heart disease and other diseases of the circulatory system: Secondary | ICD-10-CM

## 2022-10-10 DIAGNOSIS — R338 Other retention of urine: Secondary | ICD-10-CM | POA: Diagnosis present

## 2022-10-10 DIAGNOSIS — D75839 Thrombocytosis, unspecified: Secondary | ICD-10-CM | POA: Diagnosis present

## 2022-10-10 DIAGNOSIS — F0393 Unspecified dementia, unspecified severity, with mood disturbance: Secondary | ICD-10-CM | POA: Diagnosis present

## 2022-10-10 DIAGNOSIS — E538 Deficiency of other specified B group vitamins: Secondary | ICD-10-CM | POA: Diagnosis present

## 2022-10-10 DIAGNOSIS — Z9181 History of falling: Secondary | ICD-10-CM

## 2022-10-10 DIAGNOSIS — D631 Anemia in chronic kidney disease: Secondary | ICD-10-CM | POA: Diagnosis present

## 2022-10-10 DIAGNOSIS — M109 Gout, unspecified: Secondary | ICD-10-CM | POA: Diagnosis present

## 2022-10-10 DIAGNOSIS — E8889 Other specified metabolic disorders: Secondary | ICD-10-CM | POA: Diagnosis present

## 2022-10-10 DIAGNOSIS — Z96611 Presence of right artificial shoulder joint: Secondary | ICD-10-CM | POA: Diagnosis present

## 2022-10-10 DIAGNOSIS — F0394 Unspecified dementia, unspecified severity, with anxiety: Secondary | ICD-10-CM | POA: Diagnosis present

## 2022-10-10 DIAGNOSIS — F32A Depression, unspecified: Secondary | ICD-10-CM | POA: Diagnosis present

## 2022-10-10 DIAGNOSIS — T380X5A Adverse effect of glucocorticoids and synthetic analogues, initial encounter: Secondary | ICD-10-CM | POA: Diagnosis not present

## 2022-10-10 DIAGNOSIS — E1122 Type 2 diabetes mellitus with diabetic chronic kidney disease: Secondary | ICD-10-CM | POA: Diagnosis present

## 2022-10-10 DIAGNOSIS — U071 COVID-19: Principal | ICD-10-CM | POA: Diagnosis present

## 2022-10-10 DIAGNOSIS — N1832 Chronic kidney disease, stage 3b: Secondary | ICD-10-CM | POA: Diagnosis present

## 2022-10-10 DIAGNOSIS — Z66 Do not resuscitate: Secondary | ICD-10-CM | POA: Diagnosis present

## 2022-10-10 DIAGNOSIS — J9691 Respiratory failure, unspecified with hypoxia: Secondary | ICD-10-CM | POA: Diagnosis present

## 2022-10-10 DIAGNOSIS — E871 Hypo-osmolality and hyponatremia: Secondary | ICD-10-CM | POA: Diagnosis present

## 2022-10-10 DIAGNOSIS — D72829 Elevated white blood cell count, unspecified: Secondary | ICD-10-CM | POA: Diagnosis not present

## 2022-10-10 DIAGNOSIS — Z79899 Other long term (current) drug therapy: Secondary | ICD-10-CM

## 2022-10-10 DIAGNOSIS — E1151 Type 2 diabetes mellitus with diabetic peripheral angiopathy without gangrene: Secondary | ICD-10-CM | POA: Diagnosis present

## 2022-10-10 DIAGNOSIS — R296 Repeated falls: Secondary | ICD-10-CM | POA: Diagnosis present

## 2022-10-10 DIAGNOSIS — F1010 Alcohol abuse, uncomplicated: Secondary | ICD-10-CM | POA: Diagnosis present

## 2022-10-10 DIAGNOSIS — D509 Iron deficiency anemia, unspecified: Secondary | ICD-10-CM | POA: Diagnosis present

## 2022-10-10 DIAGNOSIS — Z792 Long term (current) use of antibiotics: Secondary | ICD-10-CM

## 2022-10-10 DIAGNOSIS — Z9081 Acquired absence of spleen: Secondary | ICD-10-CM

## 2022-10-10 DIAGNOSIS — N401 Enlarged prostate with lower urinary tract symptoms: Secondary | ICD-10-CM | POA: Diagnosis present

## 2022-10-10 DIAGNOSIS — Z833 Family history of diabetes mellitus: Secondary | ICD-10-CM

## 2022-10-10 LAB — COMPREHENSIVE METABOLIC PANEL
ALT: 11 U/L (ref 0–44)
AST: 21 U/L (ref 15–41)
Albumin: 4.1 g/dL (ref 3.5–5.0)
Alkaline Phosphatase: 104 U/L (ref 38–126)
Anion gap: 15 (ref 5–15)
BUN: 21 mg/dL (ref 8–23)
CO2: 16 mmol/L — ABNORMAL LOW (ref 22–32)
Calcium: 9.1 mg/dL (ref 8.9–10.3)
Chloride: 98 mmol/L (ref 98–111)
Creatinine, Ser: 2.07 mg/dL — ABNORMAL HIGH (ref 0.61–1.24)
GFR, Estimated: 34 mL/min — ABNORMAL LOW (ref >60)
Glucose, Bld: 90 mg/dL (ref 70–99)
Potassium: 4 mmol/L (ref 3.5–5.1)
Sodium: 129 mmol/L — ABNORMAL LOW (ref 135–145)
Total Bilirubin: 0.6 mg/dL (ref 0.3–1.2)
Total Protein: 7.3 g/dL (ref 6.5–8.1)

## 2022-10-10 LAB — CBC WITH DIFFERENTIAL/PLATELET
Abs Immature Granulocytes: 0.04 10*3/uL (ref 0.00–0.07)
Basophils Absolute: 0 10*3/uL (ref 0.0–0.1)
Basophils Relative: 1 %
Eosinophils Absolute: 0 10*3/uL (ref 0.0–0.5)
Eosinophils Relative: 0 %
HCT: 25.2 % — ABNORMAL LOW (ref 39.0–52.0)
Hemoglobin: 7.4 g/dL — ABNORMAL LOW (ref 13.0–17.0)
Immature Granulocytes: 1 %
Lymphocytes Relative: 19 %
Lymphs Abs: 1.5 10*3/uL (ref 0.7–4.0)
MCH: 25.9 pg — ABNORMAL LOW (ref 26.0–34.0)
MCHC: 29.4 g/dL — ABNORMAL LOW (ref 30.0–36.0)
MCV: 88.1 fL (ref 80.0–100.0)
Monocytes Absolute: 1.1 10*3/uL — ABNORMAL HIGH (ref 0.1–1.0)
Monocytes Relative: 14 %
Neutro Abs: 5 10*3/uL (ref 1.7–7.7)
Neutrophils Relative %: 65 %
Platelets: 664 10*3/uL — ABNORMAL HIGH (ref 150–400)
RBC: 2.86 MIL/uL — ABNORMAL LOW (ref 4.22–5.81)
RDW: 20.1 % — ABNORMAL HIGH (ref 11.5–15.5)
WBC: 7.7 10*3/uL (ref 4.0–10.5)
nRBC: 1.2 % — ABNORMAL HIGH (ref 0.0–0.2)

## 2022-10-10 LAB — TROPONIN I (HIGH SENSITIVITY): Troponin I (High Sensitivity): 10 ng/L (ref <18)

## 2022-10-10 LAB — RESP PANEL BY RT-PCR (RSV, FLU A&B, COVID)  RVPGX2
Influenza A by PCR: NEGATIVE
Influenza B by PCR: NEGATIVE
Resp Syncytial Virus by PCR: NEGATIVE
SARS Coronavirus 2 by RT PCR: POSITIVE — AB

## 2022-10-10 NOTE — ED Provider Triage Note (Addendum)
Emergency Medicine Provider Triage Evaluation Note  Edward Singleton, a 72 y.o. male  was evaluated in triage.  Pt complains of new O2 requirement in a patient with history of COPD, chronic smoker, alcoholic dementia, HTN, and CKD.  Patient reported about a week of feeling increased weakness and shortness of breath with exertion.  Patient's sister who is his healthcare POA/POA provides the bulk of the history.  Patient presents to the ED via EMS, after he was found to have O2 sats in the high 80s on room air..  Review of Systems  Positive: Weakness, SOB Negative: NVD  Physical Exam  BP 117/64   Pulse 68   Temp 98.4 F (36.9 C) (Oral)   Resp 18   Wt 54.9 kg   SpO2 96%   BMI 19.53 kg/m  Gen:   Awake, no distress   Resp:  Normal effort  MSK:   Moves extremities without difficulty.  Patient with a right humerus basis secondary to failed reverse shoulder replacement. Other:    Medical Decision Making  Medically screening exam initiated at 8:18 PM.  Appropriate orders placed.  Edward Singleton was informed that the remainder of the evaluation will be completed by another provider, this initial triage assessment does not replace that evaluation, and the importance of remaining in the ED until their evaluation is complete.  Geriatric patient to the ED for evaluation of generalized weakness, and increased SOB with new O2 requirement.   Melvenia Needles, PA-C 10/10/22 2020    Melvenia Needles, [place] 10/10/22 2348

## 2022-10-10 NOTE — ED Triage Notes (Signed)
BIB EMS c/o SOB 88% on RA placed on 2l o2 pt alert and oriented x 4 states SOB worse with exertion. PT sister POA good historian for pt.   PA in room to assess during triage.

## 2022-10-10 NOTE — ED Triage Notes (Signed)
Pt not on O2 at home. Chronic smoker. COPD

## 2022-10-10 NOTE — ED Triage Notes (Signed)
Pt in via EMS from home with c/o SOB. Pt with hx of COPD. Pt started with SOB Friday, worse with exertion. 131/67, 92%RA, Pt was given breathing treatment and after sats were 98% on RA.

## 2022-10-11 ENCOUNTER — Inpatient Hospital Stay
Admission: EM | Admit: 2022-10-11 | Discharge: 2022-10-15 | DRG: 177 | Disposition: A | Discharge status: Home / Self Care | Payer: No Typology Code available for payment source | Attending: Student | Admitting: Student

## 2022-10-11 ENCOUNTER — Inpatient Hospital Stay: Payer: No Typology Code available for payment source

## 2022-10-11 DIAGNOSIS — R338 Other retention of urine: Secondary | ICD-10-CM | POA: Diagnosis present

## 2022-10-11 DIAGNOSIS — F32A Depression, unspecified: Secondary | ICD-10-CM | POA: Diagnosis present

## 2022-10-11 DIAGNOSIS — F17210 Nicotine dependence, cigarettes, uncomplicated: Secondary | ICD-10-CM | POA: Diagnosis present

## 2022-10-11 DIAGNOSIS — E1122 Type 2 diabetes mellitus with diabetic chronic kidney disease: Secondary | ICD-10-CM | POA: Diagnosis present

## 2022-10-11 DIAGNOSIS — E1151 Type 2 diabetes mellitus with diabetic peripheral angiopathy without gangrene: Secondary | ICD-10-CM | POA: Diagnosis present

## 2022-10-11 DIAGNOSIS — J439 Emphysema, unspecified: Secondary | ICD-10-CM | POA: Diagnosis present

## 2022-10-11 DIAGNOSIS — N1832 Chronic kidney disease, stage 3b: Secondary | ICD-10-CM | POA: Diagnosis present

## 2022-10-11 DIAGNOSIS — R531 Weakness: Secondary | ICD-10-CM

## 2022-10-11 DIAGNOSIS — I129 Hypertensive chronic kidney disease with stage 1 through stage 4 chronic kidney disease, or unspecified chronic kidney disease: Secondary | ICD-10-CM | POA: Diagnosis present

## 2022-10-11 DIAGNOSIS — F0393 Unspecified dementia, unspecified severity, with mood disturbance: Secondary | ICD-10-CM | POA: Diagnosis present

## 2022-10-11 DIAGNOSIS — E876 Hypokalemia: Secondary | ICD-10-CM | POA: Diagnosis not present

## 2022-10-11 DIAGNOSIS — D649 Anemia, unspecified: Secondary | ICD-10-CM

## 2022-10-11 DIAGNOSIS — E7800 Pure hypercholesterolemia, unspecified: Secondary | ICD-10-CM | POA: Diagnosis present

## 2022-10-11 DIAGNOSIS — F411 Generalized anxiety disorder: Secondary | ICD-10-CM | POA: Diagnosis not present

## 2022-10-11 DIAGNOSIS — D631 Anemia in chronic kidney disease: Secondary | ICD-10-CM | POA: Diagnosis present

## 2022-10-11 DIAGNOSIS — T380X5A Adverse effect of glucocorticoids and synthetic analogues, initial encounter: Secondary | ICD-10-CM | POA: Diagnosis not present

## 2022-10-11 DIAGNOSIS — J9691 Respiratory failure, unspecified with hypoxia: Secondary | ICD-10-CM | POA: Diagnosis present

## 2022-10-11 DIAGNOSIS — E8889 Other specified metabolic disorders: Secondary | ICD-10-CM | POA: Diagnosis present

## 2022-10-11 DIAGNOSIS — E871 Hypo-osmolality and hyponatremia: Secondary | ICD-10-CM

## 2022-10-11 DIAGNOSIS — R0902 Hypoxemia: Secondary | ICD-10-CM

## 2022-10-11 DIAGNOSIS — Z66 Do not resuscitate: Secondary | ICD-10-CM | POA: Diagnosis present

## 2022-10-11 DIAGNOSIS — N189 Chronic kidney disease, unspecified: Secondary | ICD-10-CM

## 2022-10-11 DIAGNOSIS — U071 COVID-19: Principal | ICD-10-CM

## 2022-10-11 DIAGNOSIS — D509 Iron deficiency anemia, unspecified: Secondary | ICD-10-CM | POA: Diagnosis present

## 2022-10-11 DIAGNOSIS — F0394 Unspecified dementia, unspecified severity, with anxiety: Secondary | ICD-10-CM | POA: Diagnosis present

## 2022-10-11 DIAGNOSIS — F1010 Alcohol abuse, uncomplicated: Secondary | ICD-10-CM | POA: Diagnosis present

## 2022-10-11 DIAGNOSIS — I2510 Atherosclerotic heart disease of native coronary artery without angina pectoris: Secondary | ICD-10-CM | POA: Diagnosis present

## 2022-10-11 DIAGNOSIS — J441 Chronic obstructive pulmonary disease with (acute) exacerbation: Secondary | ICD-10-CM | POA: Diagnosis present

## 2022-10-11 LAB — LACTATE DEHYDROGENASE: LDH: 233 U/L — ABNORMAL HIGH (ref 98–192)

## 2022-10-11 LAB — TROPONIN I (HIGH SENSITIVITY): Troponin I (High Sensitivity): 10 ng/L (ref <18)

## 2022-10-11 LAB — BRAIN NATRIURETIC PEPTIDE: B Natriuretic Peptide: 43.4 pg/mL (ref 0.0–100.0)

## 2022-10-11 LAB — CBG MONITORING, ED
Glucose-Capillary: 117 mg/dL — ABNORMAL HIGH (ref 70–99)
Glucose-Capillary: 161 mg/dL — ABNORMAL HIGH (ref 70–99)
Glucose-Capillary: 131 mg/dL — ABNORMAL HIGH (ref 70–99)
Glucose-Capillary: 138 mg/dL — ABNORMAL HIGH (ref 70–99)

## 2022-10-11 LAB — RETICULOCYTES
Immature Retic Fract: 26.3 % — ABNORMAL HIGH (ref 2.3–15.9)
RBC.: 2.71 MIL/uL — ABNORMAL LOW (ref 4.22–5.81)
Retic Count, Absolute: 55.8 10*3/uL (ref 19.0–186.0)
Retic Ct Pct: 2.1 % (ref 0.4–3.1)

## 2022-10-11 LAB — FOLATE: Folate: >40 ng/mL (ref >5.9)

## 2022-10-11 LAB — LACTIC ACID, PLASMA
Lactic Acid, Venous: 2.6 mmol/L (ref 0.5–1.9)
Lactic Acid, Venous: 1 mmol/L (ref 0.5–1.9)

## 2022-10-11 LAB — RESPIRATORY PANEL BY PCR

## 2022-10-11 LAB — BLOOD GAS, VENOUS
Acid-base deficit: 6.2 mmol/L — ABNORMAL HIGH (ref 0.0–2.0)
Bicarbonate: 18.6 mmol/L — ABNORMAL LOW (ref 20.0–28.0)
O2 Saturation: 52.8 %
Patient temperature: 37
pCO2, Ven: 33 mmHg — ABNORMAL LOW (ref 44–60)
pH, Ven: 7.36 (ref 7.25–7.43)
pO2, Ven: 37 mmHg (ref 32–45)

## 2022-10-11 LAB — VITAMIN B12: Vitamin B-12: 141 pg/mL — ABNORMAL LOW (ref 180–914)

## 2022-10-11 LAB — C-REACTIVE PROTEIN: CRP: <0.5 mg/dL (ref <1.0)

## 2022-10-11 LAB — IRON AND TIBC
Iron: 31 ug/dL — ABNORMAL LOW (ref 45–182)
Saturation Ratios: 7 % — ABNORMAL LOW (ref 17.9–39.5)
TIBC: 427 ug/dL (ref 250–450)
UIBC: 396 ug/dL

## 2022-10-11 LAB — PROCALCITONIN: Procalcitonin: 0.12 ng/mL

## 2022-10-11 LAB — D-DIMER, QUANTITATIVE: D-Dimer, Quant: 15.96 ug/mL-FEU — ABNORMAL HIGH (ref 0.00–0.50)

## 2022-10-11 LAB — OSMOLALITY: Osmolality: 287 mOsm/kg (ref 275–295)

## 2022-10-11 LAB — FIBRINOGEN: Fibrinogen: 333 mg/dL (ref 210–475)

## 2022-10-11 LAB — HEPATITIS B SURFACE ANTIGEN: Hepatitis B Surface Ag: NONREACTIVE

## 2022-10-11 LAB — FERRITIN: Ferritin: 14 ng/mL — ABNORMAL LOW (ref 24–336)

## 2022-10-11 MED ORDER — FOLIC ACID 1 MG PO TABS
1.0000 mg | ORAL_TABLET | Freq: Every day | ORAL | Status: DC
  Administered 2022-10-11 – 2022-10-15 (×5): 1 mg via ORAL
  Filled 2022-10-11 (×5): qty 1

## 2022-10-11 MED ORDER — IPRATROPIUM-ALBUTEROL 0.5-2.5 (3) MG/3ML IN SOLN
3.0000 mL | Freq: Once | RESPIRATORY_TRACT | Status: AC
  Administered 2022-10-11: 3 mL via RESPIRATORY_TRACT
  Filled 2022-10-11: qty 3

## 2022-10-11 MED ORDER — SODIUM CHLORIDE 0.9 % IV SOLN: INTRAVENOUS | Status: DC

## 2022-10-11 MED ORDER — ALUM & MAG HYDROXIDE-SIMETH 200-200-20 MG/5ML PO SUSP
30.0000 mL | Freq: Once | ORAL | Status: AC
  Administered 2022-10-11: 30 mL via ORAL
  Filled 2022-10-11: qty 30

## 2022-10-11 MED ORDER — FOLIC ACID 1 MG PO TABS: 1.0000 mg | ORAL_TABLET | Freq: Every day | ORAL | Status: DC

## 2022-10-11 MED ORDER — TRAZODONE HCL 50 MG PO TABS
50.0000 mg | ORAL_TABLET | Freq: Every day | ORAL | Status: DC
  Administered 2022-10-11 – 2022-10-14 (×4): 50 mg via ORAL
  Filled 2022-10-11 (×4): qty 1

## 2022-10-11 MED ORDER — AMLODIPINE BESYLATE 5 MG PO TABS
5.0000 mg | ORAL_TABLET | Freq: Every day | ORAL | Status: DC
  Administered 2022-10-11: 5 mg via ORAL
  Filled 2022-10-11: qty 1

## 2022-10-11 MED ORDER — DEXAMETHASONE SODIUM PHOSPHATE 10 MG/ML IJ SOLN
6.0000 mg | INTRAMUSCULAR | Status: DC
  Administered 2022-10-11 – 2022-10-14 (×4): 6 mg via INTRAVENOUS
  Filled 2022-10-11 (×4): qty 1

## 2022-10-11 MED ORDER — HEPARIN SODIUM (PORCINE) 5000 UNIT/ML IJ SOLN
5000.0000 [IU] | Freq: Three times a day (TID) | INTRAMUSCULAR | Status: DC
  Administered 2022-10-11 – 2022-10-13 (×7): 5000 [IU] via SUBCUTANEOUS
  Filled 2022-10-11 (×8): qty 1

## 2022-10-11 MED ORDER — THIAMINE HCL 100 MG/ML IJ SOLN
100.0000 mg | Freq: Every day | INTRAMUSCULAR | Status: DC
  Filled 2022-10-11 (×5): qty 2

## 2022-10-11 MED ORDER — NIRMATRELVIR/RITONAVIR (PAXLOVID) TABLET (RENAL DOSING)
2.0000 | ORAL_TABLET | Freq: Two times a day (BID) | ORAL | Status: DC
  Administered 2022-10-11 – 2022-10-15 (×9): 2 via ORAL
  Filled 2022-10-11 (×2): qty 20

## 2022-10-11 MED ORDER — POLYETHYLENE GLYCOL 3350 17 G PO PACK: 17.0000 g | PACK | Freq: Every day | ORAL | Status: DC | PRN

## 2022-10-11 MED ORDER — CYANOCOBALAMIN 1000 MCG/ML IJ SOLN
1000.0000 ug | Freq: Every day | INTRAMUSCULAR | Status: DC
  Administered 2022-10-11 – 2022-10-13 (×3): 1000 ug via INTRAMUSCULAR
  Filled 2022-10-11 (×5): qty 1

## 2022-10-11 MED ORDER — INSULIN ASPART 100 UNIT/ML IJ SOLN
0.0000 [IU] | Freq: Three times a day (TID) | INTRAMUSCULAR | Status: DC
  Administered 2022-10-11 – 2022-10-13 (×3): 1 [IU] via SUBCUTANEOUS
  Filled 2022-10-11 (×2): qty 1

## 2022-10-11 MED ORDER — THIAMINE MONONITRATE 100 MG PO TABS
100.0000 mg | ORAL_TABLET | Freq: Every day | ORAL | Status: DC
  Administered 2022-10-11 – 2022-10-15 (×5): 100 mg via ORAL
  Filled 2022-10-11 (×5): qty 1

## 2022-10-11 MED ORDER — METOPROLOL SUCCINATE ER 25 MG PO TB24
25.0000 mg | ORAL_TABLET | Freq: Every day | ORAL | Status: DC
  Administered 2022-10-11 – 2022-10-15 (×4): 25 mg via ORAL
  Filled 2022-10-11 (×5): qty 1

## 2022-10-11 MED ORDER — LORAZEPAM 2 MG/ML IJ SOLN: 1.0000 mg | INTRAMUSCULAR | Status: AC | PRN

## 2022-10-11 MED ORDER — METHYLPREDNISOLONE SODIUM SUCC 125 MG IJ SOLR: 1.0000 mg/kg | Freq: Two times a day (BID) | INTRAMUSCULAR | Status: DC

## 2022-10-11 MED ORDER — GUAIFENESIN-DM 100-10 MG/5ML PO SYRP
10.0000 mL | ORAL_SOLUTION | ORAL | Status: DC | PRN
  Administered 2022-10-14: 10 mL via ORAL
  Filled 2022-10-11: qty 10

## 2022-10-11 MED ORDER — VITAMIN B-12 1000 MCG PO TABS: 1000.0000 ug | ORAL_TABLET | Freq: Every day | ORAL | Status: DC

## 2022-10-11 MED ORDER — LORAZEPAM 1 MG PO TABS: 1.0000 mg | ORAL_TABLET | ORAL | Status: AC | PRN

## 2022-10-11 MED ORDER — VITAMIN C 500 MG PO TABS
500.0000 mg | ORAL_TABLET | Freq: Every day | ORAL | Status: DC
  Administered 2022-10-12 – 2022-10-15 (×4): 500 mg via ORAL
  Filled 2022-10-11 (×5): qty 1

## 2022-10-11 MED ORDER — CEFADROXIL 500 MG PO CAPS
500.0000 mg | ORAL_CAPSULE | Freq: Two times a day (BID) | ORAL | Status: DC
  Administered 2022-10-11 – 2022-10-15 (×10): 500 mg via ORAL
  Filled 2022-10-11 (×10): qty 1

## 2022-10-11 MED ORDER — ESCITALOPRAM OXALATE 10 MG PO TABS
10.0000 mg | ORAL_TABLET | Freq: Every day | ORAL | Status: DC
  Administered 2022-10-11 – 2022-10-15 (×5): 10 mg via ORAL
  Filled 2022-10-11 (×6): qty 1

## 2022-10-11 MED ORDER — HYDROCOD POLI-CHLORPHE POLI ER 10-8 MG/5ML PO SUER: 5.0000 mL | Freq: Two times a day (BID) | ORAL | Status: DC | PRN

## 2022-10-11 MED ORDER — ROSUVASTATIN CALCIUM 20 MG PO TABS
20.0000 mg | ORAL_TABLET | Freq: Every day | ORAL | Status: DC
  Administered 2022-10-11 – 2022-10-15 (×5): 20 mg via ORAL
  Filled 2022-10-11 (×6): qty 1

## 2022-10-11 MED ORDER — PREDNISONE 20 MG PO TABS: 50.0000 mg | ORAL_TABLET | Freq: Every day | ORAL | Status: DC

## 2022-10-11 MED ORDER — SODIUM BICARBONATE 650 MG PO TABS
650.0000 mg | ORAL_TABLET | Freq: Two times a day (BID) | ORAL | Status: DC
  Administered 2022-10-11 (×2): 650 mg via ORAL
  Filled 2022-10-11 (×2): qty 1

## 2022-10-11 MED ORDER — SODIUM CHLORIDE 0.9 % IV BOLUS
500.0000 mL | Freq: Once | INTRAVENOUS | Status: AC
  Administered 2022-10-11: 500 mL via INTRAVENOUS

## 2022-10-11 MED ORDER — ACETAMINOPHEN 325 MG PO TABS: 650.0000 mg | ORAL_TABLET | Freq: Four times a day (QID) | ORAL | Status: DC | PRN

## 2022-10-11 MED ORDER — ASPIRIN 81 MG PO TBEC
81.0000 mg | DELAYED_RELEASE_TABLET | Freq: Every day | ORAL | Status: DC
  Administered 2022-10-11 – 2022-10-13 (×3): 81 mg via ORAL
  Filled 2022-10-11 (×3): qty 1

## 2022-10-11 MED ORDER — ADULT MULTIVITAMIN W/MINERALS CH
1.0000 | ORAL_TABLET | Freq: Every day | ORAL | Status: DC
  Administered 2022-10-11 – 2022-10-15 (×5): 1 via ORAL
  Filled 2022-10-11 (×5): qty 1

## 2022-10-11 MED ORDER — POLYSACCHARIDE IRON COMPLEX 150 MG PO CAPS
150.0000 mg | ORAL_CAPSULE | Freq: Every day | ORAL | Status: DC
  Administered 2022-10-12 – 2022-10-15 (×4): 150 mg via ORAL
  Filled 2022-10-11 (×5): qty 1

## 2022-10-11 MED ORDER — ALLOPURINOL 100 MG PO TABS
150.0000 mg | ORAL_TABLET | Freq: Every day | ORAL | Status: DC
  Administered 2022-10-11 – 2022-10-15 (×5): 150 mg via ORAL
  Filled 2022-10-11 (×2): qty 2
  Filled 2022-10-11: qty 0.5
  Filled 2022-10-11: qty 2
  Filled 2022-10-11: qty 0.5
  Filled 2022-10-11: qty 2

## 2022-10-11 MED ORDER — TAMSULOSIN HCL 0.4 MG PO CAPS
0.4000 mg | ORAL_CAPSULE | Freq: Every day | ORAL | Status: DC
  Administered 2022-10-12 – 2022-10-14 (×3): 0.4 mg via ORAL
  Filled 2022-10-11 (×6): qty 1

## 2022-10-11 MED ORDER — SODIUM BICARBONATE 650 MG PO TABS
650.0000 mg | ORAL_TABLET | Freq: Three times a day (TID) | ORAL | Status: DC
  Administered 2022-10-11 – 2022-10-15 (×11): 650 mg via ORAL
  Filled 2022-10-11 (×12): qty 1

## 2022-10-11 NOTE — ED Notes (Signed)
Pt assisted to bathroom and back in bed by this rn.

## 2022-10-11 NOTE — ED Notes (Signed)
Pt given breakfast tray

## 2022-10-11 NOTE — ED Notes (Signed)
Pt having no output with external catheter in place. Patient complaining of discomfort and the urge to urinate, but is unable to void. Bladder scan performed, pt retaining 327m. MD KDwyane Deenotified.

## 2022-10-11 NOTE — ED Notes (Signed)
Pt requesting to stand while using the urinal. Pt extremely short of breath attempting to stand. Discussed with pt the safety in not trying to stand.Pt voiced understanding. Pt repositioned in bed to use urinal while in bed. Pt given call light to use if further assistance is needed.

## 2022-10-11 NOTE — H&P (Addendum)
History and Physical    Patient: Edward Singleton SWF:[id number] DOB: January 29, 1951 DOA: 10/11/2022 DOS: the patient was seen and examined on 10/11/2022 PCP: Windy Fast, MD  Patient coming from: Home  Chief Complaint:  Chief Complaint  Patient presents with   Shortness of Breath   HPI: Edward Singleton is a 72 y.o. 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  Patient presented from home on account of acute shortness of breath.  Symptoms consistent with acute COPD exacerbation.  Despite use of his rescue inhalers at home, symptoms did not improve and hence his admission.  He reports recent URI symptoms.  Denies any known sick contacts.  On presentation, patient was noted to be saturating 88% on room air.  Symptoms did improve on 2 L of oxygen.  Workup in the ED revealed patient had COVID infection.  Chest x-ray shows central lobar emphysema changes.  No acute cardiopulmonary process however noted  Review of Systems: As mentioned in the history of present illness. All other systems reviewed and are negative. Past Medical History:  Diagnosis Date   Alcohol abuse    Atrophic kidney    Benign enlargement of prostate    Coronary artery disease    Dementia ([place])    due to alcohol   Diabetes mellitus without complication ([place])    Gout    Gout    Headache    Hypercholesteremia    Hypertension    Kidney stones    Left flank pain, chronic    Pelvic fracture ([place]) 11/17/2014   Pneumonia    Prolonged QT syndrome    Septic arthritis of shoulder, right ([place]) 05/11/2022   White matter disease    Past Surgical History:  Procedure Laterality Date   COLONOSCOPY WITH PROPOFOL N/A 07/06/2022   Procedure: COLONOSCOPY WITH PROPOFOL;  Surgeon: Jonathon Bellows, MD;  Location: [facility] ENDOSCOPY;  Service: Gastroenterology;  Laterality: N/A;    ESOPHAGOGASTRODUODENOSCOPY Left 03/18/2019   Procedure: ESOPHAGOGASTRODUODENOSCOPY (EGD);  Surgeon: Jonathon Bellows, MD;  Location: [facility] ENDOSCOPY;  Service: Gastroenterology;  Laterality: Left;   ESOPHAGOGASTRODUODENOSCOPY N/A 07/06/2022   Procedure: ESOPHAGOGASTRODUODENOSCOPY (EGD);  Surgeon: Jonathon Bellows, MD;  Location: [facility] ENDOSCOPY;  Service: Gastroenterology;  Laterality: N/A;   FRACTURE SURGERY     left renal stent placement  10/11/2011   LITHOTRIPSY     ORIF HUMERUS FRACTURE Left 11/18/2014   Procedure: OPEN REDUCTION INTERNAL FIXATION (ORIF) DISTAL HUMERUS FRACTURE;  Surgeon: Rozanna Box, MD;  Location: [place];  Service: Orthopedics;  Laterality: Left;   REVERSE SHOULDER ARTHROPLASTY Right 10/27/2021   Procedure: REVERSE SHOULDER ARTHROPLASTY;  Surgeon: Hiram Gash, MD;  Location: [place];  Service: Orthopedics;  Laterality: Right;   REVISION TOTAL SHOULDER TO REVERSE TOTAL SHOULDER Right 05/11/2022   Procedure: REVISION TOTAL SHOULDER TO REVERSE TOTAL SHOULDER;  Surgeon: Hiram Gash, MD;  Location: [facility];  Service: Orthopedics;  Laterality: Right;   SPLENECTOMY     Social History:  reports that he has been smoking cigarettes. He has a 50.00 pack-year smoking history. He has quit using smokeless tobacco.  His smokeless tobacco use included chew. He reports that he does not currently use alcohol after a past usage of about 42.0 standard drinks of alcohol per week. He reports that he does not use drugs.  No Known Allergies  Family History  Problem Relation Age of Onset   Heart  disease Mother    Heart disease Father    Diabetes Father    Nephrolithiasis Paternal Grandfather    Kidney disease Neg Hx    Prostate cancer Neg Hx     Prior to Admission medications   Medication Sig Start Date End Date Taking? Authorizing Provider  allopurinol (ZYLOPRIM) 300 MG tablet Take 150 mg by mouth daily. 07/11/22  Yes [provider]  amLODipine (NORVASC) 5 MG tablet Take 5 mg by mouth  daily. 07/11/22  Yes [provider]  cefadroxil (DURICEF) 500 MG capsule Take 500 mg by mouth 2 (two) times daily. 08/12/22  Yes [provider]  escitalopram (LEXAPRO) 10 MG tablet Take 10 mg by mouth daily. 07/11/22  Yes [provider]  metoprolol succinate (TOPROL-XL) 50 MG 24 hr tablet Take 0.5 tablets by mouth daily. 07/11/22  Yes [provider]  rosuvastatin (CRESTOR) 40 MG tablet Take 20 mg by mouth daily. 07/11/22  Yes [provider]  traZODone (DESYREL) 50 MG tablet Take 1 tablet by mouth at bedtime. 07/11/22  Yes [provider]  cefadroxil (DURICEF) 500 MG capsule Take 2 capsules (1,000 mg total) by mouth 2 (two) times daily. 06/03/22   Mignon Pine, DO  celecoxib (CELEBREX) 100 MG capsule Take 100 mg by mouth 2 (two) times daily. 04/27/22   [provider]  doxycycline (VIBRA-TABS) 100 MG tablet Take 1 tablet (100 mg total) by mouth 2 (two) times daily. Patient not taking: Reported on 07/06/2022 06/03/22   Mignon Pine, DO  folic acid (FOLVITE) 1 MG tablet Take 1 mg by mouth daily.    [provider]  metFORMIN (GLUCOPHAGE) 500 MG tablet Take 500 mg by mouth 2 (two) times daily. 06/04/19   [provider]  rosuvastatin (CRESTOR) 20 MG tablet Take 1 tablet (20 mg total) by mouth daily. Hold while on daptomycin 06/23/22   McBane, Maylene Roes, PA-C  thiamine (VITAMIN B-1) 100 MG tablet Take 100 mg by mouth daily.    [provider]    Physical Exam: Vitals:   10/10/22 1904 10/10/22 1911 10/11/22 0000  BP:  117/64 (!) 115/56  Pulse:  68 66  Resp:  18 20  Temp:  98.4 F (36.9 C) 97.6 F (36.4 C)  TempSrc:  Oral Oral  SpO2:  96% 92%  Weight: 54.9 kg     Frail but well looking gentleman.  Not in any acute respiratory distress at this time. HEENT: Oral mucosa moist.  No nasal discharge noted. Cardiovascular: S1-S2 with no murmur. Chest: Clinically diminished.  No wheezes appreciated at this  time. Abdomen soft nontender Extremities without pedal edema. CNS: No obvious cranial nerve deficits.  Limited motion of right arm. Data Reviewed: Labs significant for sodium 129, potassium 4.0, bicarb 16, glucose 90, BUN 21 creatinine 2.07, WBC 7.7 hemoglobin 7.4 hematocrit 25.2 platelet count of 664.  Neutrophil count of 65.  SARS Covid positive.  cystic centrilobular emphysematous changes predominantly within the upper lungs, similar to prior. No acute airspace opacity. Flattening of the diaphragms and moderate hyperinflation. No pleural effusion or pneumothorax. Moderate multilevel degenerative disc changes of the thoracic spine. Old healed posterior left eighth rib fracture. Status post right shoulder arthroplasty.   IMPRESSION: 1. No acute cardiopulmonary process. 2. Centrilobular emphysematous changes, similar to prior. Assessment and Plan: 72 year old gentleman with acute COPD exacerbation secondary to COVID infection.  Patient was initiated on Paxlovid in the ED.  Have also initiated treatment with Decadron.  Acute hypoxic respiratory  insufficiency secondary to COPD exacerbation.  COPD exacerbation likely as result of COVID infection.  COPD be optimized with bronchodilators and steroids.  No indication for empiric antibiotics at this time.  Will monitor for improvement.  If symptoms does not improve, consider pulmonology consult.  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  Recent humeral fracture with multiple surgeries and reinfections.  Long QTc syndrome: EKG on this admission shows QTc within limits.  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  Patient will continue with same.  Frequent falls at home: Patient denies any recent falls.  Sister who is his primary caretaker however concerned about right.  X-ray of the  right humerus has been ordered and pending.  HTN-resume home medications as appropriate.  Acute metabolic acidosis: Etiology is unclear.  Lactate pending.  Replete with p.o. bicarb.  Follow-up BMP in AM.  If worsening, will consider IV bicarb drip.  Type 2 diabetes mellitus: On metformin at home . SSI for optimization of blood glucose   CAD/PAD- continue home regimen   CKD stage III- at baseline, follow  Anemia of chronic disease: Anemia panel has been sent and pending.  No indication for PRBC transfusion at this time.  Will monitor H&H.  No obvious evidence of bleeding at this time.  Hold anticoagulation for now due to high risk of bleeding.   Hx of EtOH abuse -denies any recent exposure.  CIWA protocol as needed     Advance Care Planning: CODE STATUS DNR: Patient presented with DNR papers.  Consults: none  Family Communication: Discussed at lenth plan of care with sister  Severity of Illness: The appropriate patient status for this patient is Inpatient.  Inpatient status is judged to be reasonable and necessary in order to provide the required intensity of service to ensure the patient's safety. The patient's presenting symptoms, physical exam findings, and initial radiographic and laboratory data in the context of their medical condition is felt to place them at decreased risk for further clinical deterioration. Furthermore, it is anticipated that the patient will be medically stable for discharge from the hospital within 2 midnights of admission.   Author: Artist Beach, MD 10/11/2022 1:14 AM  For on call review [email or web address].

## 2022-10-11 NOTE — ED Provider Notes (Signed)
[facility] Provider Note    Event Date/Time   First MD Initiated Contact with Patient 10/11/22 [phone]     (approximate)   History   Shortness of Breath   HPI  Edward Singleton is a 72 y.o. male brought to the ED via EMS from home with a chief complaint of cough and shortness of breath.  History of COPD not on home oxygen.  Symptoms x 3 days, worse with exertion.  Room air saturation 88%; placed on 2 L nasal cannula oxygen with improvement.  History of anemia of unknown etiology.  Endorses chest tightness and shortness of breath with nonproductive cough.  Denies abdominal pain, nausea, vomiting or dizziness.     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   Diabetes mellitus without complication ([place])    Gout    Gout    Headache    Hypercholesteremia    Hypertension    Kidney stones    Left flank pain, chronic    Pelvic fracture ([place]) 11/17/2014   Pneumonia    Prolonged QT syndrome    Septic arthritis of shoulder, right ([place]) 05/11/2022   White matter disease      Active Problem List   Patient Active Problem List   Diagnosis Date Noted   Unintentional weight loss    Adenomatous polyp of colon    Septic arthritis of shoulder, right ([place]) 05/11/2022   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 05/11/2022   Long term (current) use of aspirin 11/26/2021   Anxiety disorder, unspecified 11/15/2021   Long Q-T syndrome 11/15/2021   Streptococcal bacteremia 10/26/2021   Closed fracture of right proximal humerus 10/26/2021   Urinary retention 10/26/2021   Hyponatremia 66/44/0347   Metabolic acidosis    Shock ([place])    Rhabdomyolysis 10/18/2021   Delayed emergence from anesthesia 03/18/2019   Hypokalemia 42/59/5638   Alcoholic ketoacidosis 75/64/3329   Alcohol  withdrawal syndrome with complication ([place]) 51/88/4166   Diabetes ([place]) 02/21/2019   Left rotator cuff tear arthropathy 12/28/2015   Hyperlipidemia 12/24/2015   Tobacco abuse 11/06/2015   Mood disturbance 11/06/2015   CAD (coronary artery disease) 10/16/2015   PAD (peripheral artery disease) ([place]) 10/16/2015   Preventative health care 08/13/2015   Renal artery stenosis ([place]) 07/30/2015   Acute renal failure superimposed on stage 3b chronic kidney disease ([place]) 07/30/2015   Kidney atrophy 07/30/2015   Chronic renal failure, stage 3b ([place]) 07/30/2015   Acute renal failure (ARF) ([place]) 07/23/2015   Insomnia 07/22/2015   Anemia 07/15/2015   History of substance abuse ([place]) 07/15/2015   BPH (benign prostatic hyperplasia) 07/15/2015   Gout 07/15/2015   Kidney stones 05/11/2015   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   HTN (hypertension) 11/17/2014   Alcohol abuse 11/17/2014     Past Surgical History   Past Surgical History:  Procedure Laterality Date   COLONOSCOPY WITH PROPOFOL N/A 07/06/2022   Procedure: COLONOSCOPY WITH PROPOFOL;  Surgeon: Jonathon Bellows, MD;  Location: [facility] ENDOSCOPY;  Service: Gastroenterology;  Laterality: N/A;   ESOPHAGOGASTRODUODENOSCOPY Left 03/18/2019   Procedure: ESOPHAGOGASTRODUODENOSCOPY (EGD);  Surgeon: Jonathon Bellows, MD;  Location: [facility] ENDOSCOPY;  Service: Gastroenterology;  Laterality: Left;   ESOPHAGOGASTRODUODENOSCOPY N/A 07/06/2022   Procedure: ESOPHAGOGASTRODUODENOSCOPY (EGD);  Surgeon: Jonathon Bellows, MD;  Location: [facility] ENDOSCOPY;  Service: Gastroenterology;  Laterality: N/A;   FRACTURE SURGERY     left renal stent placement  10/11/2011   LITHOTRIPSY     ORIF HUMERUS FRACTURE Left 11/18/2014   Procedure: OPEN REDUCTION INTERNAL FIXATION (ORIF) DISTAL HUMERUS FRACTURE;  Surgeon: Rozanna Box, MD;  Location: [place];  Service: Orthopedics;  Laterality: Left;   REVERSE SHOULDER ARTHROPLASTY Right 10/27/2021   Procedure: REVERSE SHOULDER ARTHROPLASTY;   Surgeon: Hiram Gash, MD;  Location: [place];  Service: Orthopedics;  Laterality: Right;   REVISION TOTAL SHOULDER TO REVERSE TOTAL SHOULDER Right 05/11/2022   Procedure: REVISION TOTAL SHOULDER TO REVERSE TOTAL SHOULDER;  Surgeon: Hiram Gash, MD;  Location: [facility];  Service: Orthopedics;  Laterality: Right;   SPLENECTOMY       Home Medications   Prior to Admission medications   Medication Sig Start Date End Date Taking? Authorizing Provider  cefadroxil (DURICEF) 500 MG capsule Take 2 capsules (1,000 mg total) by mouth 2 (two) times daily. 06/03/22   Mignon Pine, DO  celecoxib (CELEBREX) 100 MG capsule Take 100 mg by mouth 2 (two) times daily. 04/27/22   [provider]  doxycycline (VIBRA-TABS) 100 MG tablet Take 1 tablet (100 mg total) by mouth 2 (two) times daily. Patient not taking: Reported on 07/06/2022 06/03/22   Mignon Pine, DO  folic acid (FOLVITE) 1 MG tablet Take 1 mg by mouth daily.    [provider]  metFORMIN (GLUCOPHAGE) 500 MG tablet Take 500 mg by mouth 2 (two) times daily. 06/04/19   [provider]  rosuvastatin (CRESTOR) 20 MG tablet Take 1 tablet (20 mg total) by mouth daily. Hold while on daptomycin 06/23/22   McBane, Maylene Roes, PA-C  thiamine (VITAMIN B-1) 100 MG tablet Take 100 mg by mouth daily.    [provider]     Allergies  Patient has no known allergies.   Family History   Family History  Problem Relation Age of Onset   Heart disease Mother    Heart disease Father    Diabetes Father    Nephrolithiasis Paternal Grandfather    Kidney disease Neg Hx    Prostate cancer Neg Hx      Physical Exam  Triage Vital Signs: ED Triage Vitals  Enc Vitals Group     BP 10/10/22 1911 117/64     Pulse Rate 10/10/22 1911 68     Resp 10/10/22 1911 18     Temp 10/10/22 1911 98.4 F (36.9 C)     Temp Source 10/10/22 1911 Oral     SpO2 10/10/22 1911 96 %     Weight 10/10/22 1904 121 lb (54.9 kg)     Height --       Head Circumference --      Peak Flow --      Pain Score 10/10/22 1903 0     Pain Loc --      Pain Edu? --      Excl. in [place]? --     Updated Vital Signs: BP (!) 115/56 (BP Location: Left Arm)   Pulse 66   Temp 97.6 F (36.4 C) (Oral)   Resp 20   Wt 54.9 kg   SpO2 92%   BMI 19.53 kg/m    General: Awake, mild distress.  Pale. CV:  RRR.  Good peripheral perfusion.  Resp:  Normal effort.  Diminished, otherwise CTAB. Abd:  Nontender.  No distention.  Other:  Bilateral calves are supple and nontender.   ED Results / Procedures / Treatments  Labs (all labs ordered are listed, but only abnormal results are displayed) Labs Reviewed  RESP PANEL BY RT-PCR (RSV, FLU A&B, COVID)  RVPGX2 - Abnormal; Notable for the following components:      Result Value   SARS Coronavirus 2 by RT PCR POSITIVE (*)    All other components within normal limits  CBC WITH DIFFERENTIAL/PLATELET - Abnormal; Notable for the following components:   RBC 2.86 (*)    Hemoglobin 7.4 (*)    HCT 25.2 (*)    MCH 25.9 (*)    MCHC 29.4 (*)    RDW 20.1 (*)    Platelets 664 (*)    nRBC 1.2 (*)    Monocytes Absolute 1.1 (*)    All other components within normal limits  COMPREHENSIVE METABOLIC PANEL - Abnormal; Notable for the following components:   Sodium 129 (*)    CO2 16 (*)    Creatinine, Ser 2.07 (*)    GFR, Estimated 34 (*)    All other components within normal limits  BRAIN NATRIURETIC PEPTIDE  C-REACTIVE PROTEIN  D-DIMER, QUANTITATIVE  FERRITIN  FIBRINOGEN  HEPATITIS B SURFACE ANTIGEN  LACTATE DEHYDROGENASE  PROCALCITONIN  ABO/RH  TYPE AND SCREEN  TROPONIN I (HIGH SENSITIVITY)  TROPONIN I (HIGH SENSITIVITY)     EKG  ED ECG REPORT I, Tifany Hirsch J, the attending physician, personally viewed and interpreted this ECG.   Date: 10/11/2022  EKG Time: 1921  Rate: 73  Rhythm: normal sinus rhythm  Axis: Normal  Intervals:none  ST&T Change: Nonspecific    RADIOLOGY I have independently  visualized and interpreted patient's x-ray as well as noted the radiology interpretation:  X-ray: No acute cardiopulmonary process  Official radiology report(s): DG Chest 2 View  Result Date: 10/10/2022 CLINICAL DATA:  Shortness of breath. EXAM: CHEST - 2 VIEW COMPARISON:  AP chest 04/22/2022, 10/26/2021 FINDINGS: Cardiac silhouette and mediastinal contours are within normal limits. There are cystic centrilobular emphysematous changes predominantly within the upper lungs, similar to prior. No acute airspace opacity. Flattening of the diaphragms and moderate hyperinflation. No pleural effusion or pneumothorax. Moderate multilevel degenerative disc changes of the thoracic spine. Old healed posterior left eighth rib fracture. Status post right shoulder arthroplasty. IMPRESSION: 1. No acute cardiopulmonary process. 2. Centrilobular emphysematous changes, similar to prior. Electronically Signed   By: Yvonne Kendall M.D.   On: 10/10/2022 20:02     PROCEDURES:  Critical Care performed: Yes, see critical care procedure note(s)  CRITICAL CARE Performed by: Paulette Blanch   Total critical care time: 30 minutes  Critical care time was exclusive of separately billable procedures and treating other patients.  Critical care was necessary to treat or prevent imminent or life-threatening deterioration.  Critical care was time spent personally by me on the following activities: development of treatment plan with patient and/or surrogate as well as nursing, discussions with consultants, evaluation of patient's response to treatment, examination of patient, obtaining history from patient or surrogate, ordering and performing treatments and interventions, ordering and review of laboratory studies, ordering and review of radiographic studies, pulse oximetry and re-evaluation of patient's condition.   Marland Kitchen1-3 Lead EKG Interpretation  Performed by: Paulette Blanch, MD Authorized by: Paulette Blanch, MD     Interpretation:  normal     ECG rate:  75   ECG rate assessment: normal     Rhythm: sinus rhythm     Ectopy:  none     Conduction: normal   Comments:     Patient placed on cardiac monitor to evaluate for arrhythmias    MEDICATIONS ORDERED IN ED: Medications  nirmatrelvir/ritonavir (renal dosing) (PAXLOVID) 2 tablet (has no administration in time range)  methylPREDNISolone sodium succinate (SOLU-MEDROL) 125 mg/2 mL injection 55 mg (has no administration in time range)    Followed by  predniSONE (DELTASONE) tablet 50 mg (has no administration in time range)  ipratropium-albuterol (DUONEB) 0.5-2.5 (3) MG/3ML nebulizer solution 3 mL (has no administration in time range)     IMPRESSION / MDM / ASSESSMENT AND PLAN / ED COURSE  I reviewed the triage vital signs and the nursing notes.                             72 year old male presenting with cough, hypoxia, shortness of breath, chest tightness and generalized weakness. Differential includes, but is not limited to, viral syndrome, bronchitis including COPD exacerbation, pneumonia, reactive airway disease including asthma, CHF including exacerbation with or without pulmonary/interstitial edema, pneumothorax, ACS, thoracic trauma, and pulmonary embolism.  I have personally reviewed patient's records and note a PCP office visit on 07/11/2022.  Patient's presentation is most consistent with acute presentation with potential threat to life or bodily function.  The patient is on the cardiac monitor to evaluate for evidence of arrhythmia and/or significant heart rate changes.  Laboratory results demonstrate stable anemia H/H 7.4/25, stable creatinine 2.07, initial troponin and chest x-ray unremarkable.  Patient is COVID-positive.  He has been vaccinated.  Will administer IV Solu-Medrol, DuoNeb, renal dose Paxlovid.  Will consult hospitalist services for evaluation and admission.      FINAL CLINICAL IMPRESSION(S) / ED DIAGNOSES   Final diagnoses:  COVID-19   Hypoxia  Anemia, unspecified type  Generalized weakness  Chronic kidney disease, unspecified CKD stage  Hyponatremia     Rx / DC Orders   ED Discharge Orders     None        Note:  This document was prepared using Dragon voice recognition software and may include unintentional dictation errors.   Paulette Blanch, MD 10/11/22 [phone]

## 2022-10-11 NOTE — Progress Notes (Signed)
[facility] Progress Note  Patient: Edward Singleton    ZHY:[id number]  DOA: 10/11/2022     Date of Service: the patient was seen and examined on 10/11/2022  Chief Complaint  Patient presents with   Shortness of Breath   Brief hospital course:  HPI: Edward Singleton is a 72 y.o. 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  Patient presented from home on account of acute shortness of breath.  Symptoms consistent with acute COPD exacerbation.  Despite use of his rescue inhalers at home, symptoms did not improve and hence his admission.  He reports recent URI symptoms.  Denies any known sick contacts.  On presentation, patient was noted to be saturating 88% on room air.  Symptoms did improve on 2 L of oxygen.   Workup in the ED revealed patient had COVID infection.  Chest x-ray shows central lobar emphysema changes.  No acute cardiopulmonary process however noted   Assessment and Plan: Acute hypoxic respiratory insufficiency secondary to COPD exacerbation.  COPD exacerbation likely as result of COVID infection.  COPD be optimized with bronchodilators and steroids.   No indication for empiric antibiotics at this time.  Will monitor for improvement.  If symptoms does not improve, consider pulmonology consult.   COVID-19 infection: Patient is on Paxlovid   Elevated creatinine, baseline creatinine 1.7, presented with creatinine 2.06 on admission CKD stage IIIb Monitor creatinine level daily  Metabolic ketosis, CO2 level 16 Started oral bicarbonate.  Monitor BMP daily  Urinary retention, Foley catheter was inserted. Bladder scan showed 344-hour urine Started Flomax  Hyponatremia could be secondary to EtOH abuse Check serum osmolality Monitor sodium level daily   Known history of alcohol abuse with multiple falls in  the past and subsequent splenic injury resulting in splenectomy.  Recent humeral fracture with multiple surgeries and reinfections.   Long QTc syndrome: EKG on this admission shows QTc within limits.   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  Patient will continue with same.   Frequent falls at home: Patient denies any recent falls.  Sister who is his primary caretaker however concerned about right.   X-ray of the right humerus: Right shoulder arthroplasty with additional findings suspicious for lucency along the humeral component.   Recommended to follow with orthopedic surgery as an outpatient  HTN-resume home medications as appropriate.     Type 2 diabetes mellitus: On metformin at home . SSI for optimization of blood glucose   CAD/PAD- continue home regimen    Anemia of chronic disease, iron deficiency and vitamin B12 deficiency Transferrin saturation 7% Continue oral iron supplement with vitamin C  No indication for PRBC transfusion at this time.  Will monitor H&H.  No obvious evidence of bleeding at this time.   Vitamin B12 level 141, target >400, started vitamin B12 1000 mcg IM injection daily during hospital stay followed by oral supplement.  Need to follow with PCP to repeat iron profile and B12 level after 3 to 6 months.   Thrombocytosis, most likely reactive, monitor platelet count daily  Hx of EtOH abuse -denies any recent exposure.  CIWA protocol as needed   Body mass index is 19.53 kg/m.  Interventions:       Diet: Carb modified DVT Prophylaxis: Subcutaneous Heparin    Advance goals of care discussion: DNR  Family Communication: family was NOT present  at bedside, at the time of interview.  The pt provided permission to discuss medical plan with the family. Opportunity was given to ask question and all questions were answered satisfactorily.   Disposition:  Pt is from Home, admitted with COVID viral infection, still  has COVID infection on oxygen, which precludes a safe discharge. Discharge to home, when stable, may need 1-2 more days.  Subjective: No significant events overnight, patient admitted overnight due to COVID viral infection and respiratory failure, we will continue to monitor.  Patient denies any other active issues.  Physical Exam: General:  alert oriented to time, place, and person.  Appear in mild distress, affect appropriate Eyes: PERRLA ENT: Oral Mucosa Clear, moist  Neck: no JVD,  Cardiovascular: S1 and S2 Present, no Murmur,  Respiratory: increased respiratory effort, Bilateral Air entry equal and Decreased, mild Crackles, no wheezes Abdomen: Bowel Sound present, Soft and no tenderness,  Skin: no rashes Extremities: no Pedal edema, no calf tenderness Neurologic: without any new focal findings Gait not checked due to patient safety concerns  Vitals:   10/11/22 1100 10/11/22 1240 10/11/22 1300 10/11/22 1555  BP: (!) 143/70 133/64  (!) 141/75  Pulse: 81 78  78  Resp: (!) 21 18  (!) 21  Temp:   98.1 F (36.7 C) 98.2 F (36.8 C)  TempSrc:      SpO2: 100% 100%  100%  Weight:        Intake/Output Summary (Last 24 hours) at 10/11/2022 1608 Last data filed at 10/11/2022 0086 Gross per 24 hour  Intake 500 ml  Output --  Net 500 ml   Filed Weights   10/10/22 1904  Weight: 54.9 kg    Data Reviewed: I have personally reviewed and interpreted daily labs, tele strips, imagings as discussed above. I reviewed all nursing notes, pharmacy notes, vitals, pertinent old records I have discussed plan of care as described above with RN and patient/family.  CBC: Recent Labs  Lab 10/10/22 1915  WBC 7.7  NEUTROABS 5.0  HGB 7.4*  HCT 25.2*  MCV 88.1  PLT 761*   Basic Metabolic Panel: Recent Labs  Lab 10/10/22 1915  NA 129*  K 4.0  CL 98  CO2 16*  GLUCOSE 90  BUN 21  CREATININE 2.07*  CALCIUM 9.1    Studies: DG Humerus Right  Result Date: 10/11/2022 CLINICAL DATA:   Atraumatic right shoulder pain. EXAM: RIGHT HUMERUS - 2+ VIEW COMPARISON:  May 11, 2022 FINDINGS: Right shoulder arthroplasty is seen. A thin, circumferential lucency is seen surrounding the humeral component within the proximal to mid humeral shaft. This represents a new finding when compared to the prior study. Multiple chronic right-sided rib fractures are noted. Soft tissues are unremarkable. IMPRESSION: Right shoulder arthroplasty with additional findings suspicious for lucency along the humeral component, as described above. Electronically Signed   By: Virgina Norfolk M.D.   On: 10/11/2022 02:48   DG Chest 2 View  Result Date: 10/10/2022 CLINICAL DATA:  Shortness of breath. EXAM: CHEST - 2 VIEW COMPARISON:  AP chest 04/22/2022, 10/26/2021 FINDINGS: Cardiac silhouette and mediastinal contours are within normal limits. There are cystic centrilobular emphysematous changes predominantly within the upper lungs, similar to prior. No acute airspace opacity. Flattening of the diaphragms and moderate hyperinflation. No pleural effusion or pneumothorax. Moderate multilevel degenerative disc changes of the thoracic spine. Old healed posterior left eighth rib fracture. Status post right shoulder arthroplasty. IMPRESSION: 1. No acute cardiopulmonary process. 2. Centrilobular emphysematous changes, similar to prior.  Electronically Signed   By: Yvonne Kendall M.D.   On: 10/10/2022 20:02    Scheduled Meds:  allopurinol  150 mg Oral Daily   amLODipine  5 mg Oral Daily   aspirin EC  81 mg Oral Daily   cefadroxil  500 mg Oral BID   dexamethasone (DECADRON) injection  6 mg Intravenous Q24H   escitalopram  10 mg Oral Daily   folic acid  1 mg Oral Daily   insulin aspart  0-6 Units Subcutaneous TID WC   metoprolol succinate  25 mg Oral Daily   multivitamin with minerals  1 tablet Oral Daily   nirmatrelvir/ritonavir (renal dosing)  2 tablet Oral BID   rosuvastatin  20 mg Oral Daily   sodium bicarbonate  650 mg  Oral BID   thiamine  100 mg Oral Daily   Or   thiamine  100 mg Intravenous Daily   traZODone  50 mg Oral QHS   Continuous Infusions:  sodium chloride 75 mL/hr at 10/11/22 0817   PRN Meds: acetaminophen, chlorpheniramine-HYDROcodone, guaiFENesin-dextromethorphan, LORazepam **OR** LORazepam, polyethylene glycol  Time spent: 35 minutes  Author: Val Riles. MD [facility] 10/11/2022 4:08 PM  To reach On-call, see care teams to locate the attending and reach out to them via [email or web address]. If 7PM-7AM, please contact night-coverage If you still have difficulty reaching the attending provider, please page the [facility] (Director on Call) for [facility] on amion for assistance.

## 2022-10-12 ENCOUNTER — Encounter: Payer: Self-pay | Admitting: Internal Medicine

## 2022-10-12 DIAGNOSIS — J441 Chronic obstructive pulmonary disease with (acute) exacerbation: Secondary | ICD-10-CM | POA: Diagnosis not present

## 2022-10-12 LAB — CBC WITH DIFFERENTIAL/PLATELET
Abs Immature Granulocytes: 0.07 K/uL (ref 0.00–0.07)
Basophils Absolute: 0 K/uL (ref 0.0–0.1)
Basophils Relative: 0 %
Eosinophils Absolute: 0 K/uL (ref 0.0–0.5)
Eosinophils Relative: 0 %
HCT: 19.1 % — ABNORMAL LOW (ref 39.0–52.0)
Hemoglobin: 5.7 g/dL — ABNORMAL LOW (ref 13.0–17.0)
Immature Granulocytes: 1 %
Lymphocytes Relative: 7 %
Lymphs Abs: 0.6 K/uL — ABNORMAL LOW (ref 0.7–4.0)
MCH: 25.6 pg — ABNORMAL LOW (ref 26.0–34.0)
MCHC: 29.8 g/dL — ABNORMAL LOW (ref 30.0–36.0)
MCV: 85.7 fL (ref 80.0–100.0)
Monocytes Absolute: 0.5 K/uL (ref 0.1–1.0)
Monocytes Relative: 6 %
Neutro Abs: 7.1 K/uL (ref 1.7–7.7)
Neutrophils Relative %: 86 %
Platelets: 600 K/uL — ABNORMAL HIGH (ref 150–400)
RBC: 2.23 MIL/uL — ABNORMAL LOW (ref 4.22–5.81)
RDW: 19.7 % — ABNORMAL HIGH (ref 11.5–15.5)
WBC: 8.2 K/uL (ref 4.0–10.5)
nRBC: 1.1 % — ABNORMAL HIGH (ref 0.0–0.2)

## 2022-10-12 LAB — COMPREHENSIVE METABOLIC PANEL WITH GFR
ALT: 12 U/L (ref 0–44)
AST: 20 U/L (ref 15–41)
Albumin: 3.2 g/dL — ABNORMAL LOW (ref 3.5–5.0)
Alkaline Phosphatase: 77 U/L (ref 38–126)
Anion gap: 10 (ref 5–15)
BUN: 28 mg/dL — ABNORMAL HIGH (ref 8–23)
CO2: 19 mmol/L — ABNORMAL LOW (ref 22–32)
Calcium: 8.2 mg/dL — ABNORMAL LOW (ref 8.9–10.3)
Chloride: 103 mmol/L (ref 98–111)
Creatinine, Ser: 1.43 mg/dL — ABNORMAL HIGH (ref 0.61–1.24)
GFR, Estimated: 53 mL/min — ABNORMAL LOW
Glucose, Bld: 130 mg/dL — ABNORMAL HIGH (ref 70–99)
Potassium: 3.4 mmol/L — ABNORMAL LOW (ref 3.5–5.1)
Sodium: 132 mmol/L — ABNORMAL LOW (ref 135–145)
Total Bilirubin: 0.7 mg/dL (ref 0.3–1.2)
Total Protein: 6 g/dL — ABNORMAL LOW (ref 6.5–8.1)

## 2022-10-12 LAB — HEMOGLOBIN AND HEMATOCRIT, BLOOD
HCT: 21.8 % — ABNORMAL LOW (ref 39.0–52.0)
HCT: 24.5 % — ABNORMAL LOW (ref 39.0–52.0)
Hemoglobin: 6.3 g/dL — ABNORMAL LOW (ref 13.0–17.0)
Hemoglobin: 7.8 g/dL — ABNORMAL LOW (ref 13.0–17.0)

## 2022-10-12 LAB — CBG MONITORING, ED: Glucose-Capillary: 137 mg/dL — ABNORMAL HIGH (ref 70–99)

## 2022-10-12 LAB — PHOSPHORUS: Phosphorus: 3.2 mg/dL (ref 2.5–4.6)

## 2022-10-12 LAB — MAGNESIUM: Magnesium: 2.2 mg/dL (ref 1.7–2.4)

## 2022-10-12 LAB — PREPARE RBC (CROSSMATCH)

## 2022-10-12 LAB — GLUCOSE, CAPILLARY
Glucose-Capillary: 133 mg/dL — ABNORMAL HIGH (ref 70–99)
Glucose-Capillary: 125 mg/dL — ABNORMAL HIGH (ref 70–99)

## 2022-10-12 LAB — C-REACTIVE PROTEIN: CRP: 0.5 mg/dL

## 2022-10-12 MED ORDER — POTASSIUM CHLORIDE CRYS ER 20 MEQ PO TBCR
40.0000 meq | EXTENDED_RELEASE_TABLET | Freq: Once | ORAL | Status: AC
  Administered 2022-10-12: 40 meq via ORAL
  Filled 2022-10-12: qty 2

## 2022-10-12 MED ORDER — SODIUM CHLORIDE 0.9% IV SOLUTION: Freq: Once | INTRAVENOUS | Status: AC

## 2022-10-12 MED ORDER — AMLODIPINE BESYLATE 5 MG PO TABS
5.0000 mg | ORAL_TABLET | Freq: Every day | ORAL | Status: DC
  Administered 2022-10-13 – 2022-10-15 (×3): 5 mg via ORAL
  Filled 2022-10-12 (×3): qty 1

## 2022-10-12 NOTE — ED Notes (Signed)
Dr. Dwyane Dee made aware of HGB 5.7 - awaiting new orders.

## 2022-10-12 NOTE — Progress Notes (Signed)
Patient refused to answer admission questions; states he is leaving.

## 2022-10-12 NOTE — Progress Notes (Signed)
[facility] Progress Note  Patient: NESBIT MICHON    EGB:[id number]  DOA: 10/11/2022     Date of Service: the patient was seen and examined on 10/12/2022  Chief Complaint  Patient presents with   Shortness of Breath   Brief hospital course:  HPI: DAYN BARICH is a 72 y.o. 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  Patient presented from home on account of acute shortness of breath.  Symptoms consistent with acute COPD exacerbation.  Despite use of his rescue inhalers at home, symptoms did not improve and hence his admission.  He reports recent URI symptoms.  Denies any known sick contacts.  On presentation, patient was noted to be saturating 88% on room air.  Symptoms did improve on 2 L of oxygen.   Workup in the ED revealed patient had COVID infection.  Chest x-ray shows central lobar emphysema changes.  No acute cardiopulmonary process however noted   Assessment and Plan: Acute hypoxic respiratory insufficiency secondary to COPD exacerbation.  COPD exacerbation likely as result of COVID infection.  COPD be optimized with bronchodilators and steroids.   No indication for empiric antibiotics at this time.   Will monitor for improvement.    COVID-19 infection: Patient is on Paxlovid   Elevated creatinine, baseline creatinine 1.7, presented with creatinine 2.06 on admission CKD stage IIIb Cr 1.43 improving Monitor creatinine level daily  Metabolic ketosis, CO2 level 16 Co 16--19  Started oral bicarbonate.  Monitor BMP daily  Hypokalemia, mild, potassium repleted.  Urinary retention, Foley catheter was inserted. 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  Recent humeral fracture with multiple surgeries and reinfections.   Long QTc syndrome: EKG on this admission shows QTc within limits.   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  Patient will continue with same.   Frequent falls at home: Patient denies any recent falls.  Sister who is his primary caretaker however concerned about right.   X-ray of the right humerus: Right shoulder arthroplasty with additional findings suspicious for lucency along the humeral component.   Recommended to follow with orthopedic surgery as an outpatient  HTN-resume home medications as appropriate.     Type 2 diabetes mellitus: On metformin at home . SSI for optimization of blood glucose   CAD/PAD- continue home regimen    Anemia of chronic disease, iron deficiency and vitamin B12 deficiency Transferrin saturation 7% Continue oral iron supplement with vitamin C  No indication for PRBC transfusion at this time.  Will monitor H&H.  No obvious evidence of bleeding at this time.   Vitamin B12 level 141, target >400, started vitamin B12 1000 mcg IM injection daily during hospital stay followed by oral supplement.  Need to follow with PCP to repeat iron profile and B12 level after 3 to 6 months. 1/3 Hb 6.3, dropped, transfuse 1 unit of PRBC Check FOBT  Thrombocytosis, most likely reactive, monitor platelet count daily  Hx of EtOH abuse -denies any recent exposure.  CIWA protocol as needed   Body mass index is 19.53 kg/m.  Interventions:       Diet: Carb modified DVT Prophylaxis: Subcutaneous Heparin  Advance goals of care discussion: DNR  Family Communication: family was NOT present at bedside, at the time of interview.  The pt provided permission to discuss medical plan with the family. Opportunity was given to ask question and all questions were answered satisfactorily.    Disposition:  Pt is from Home, admitted with COVID viral infection, still has COVID infection on oxygen, Hb dropped, received 1 unit of PRBC, which precludes a safe discharge. Discharge to home, when stable, may need 1-2 more days.  Subjective: No significant events overnight, patient still has shortness of breath, requiring supplemental O2 evaluation, denies any chest pain or palpitation, no shortness of breath.  No any other active issues. Patient's hemoglobin dropped, no signs of bleeding.  Patient agreed with blood transfusion.  Physical Exam: General:  alert oriented to time, place, and person.  Appear in mild distress, affect appropriate Eyes: PERRLA ENT: Oral Mucosa Clear, moist  Neck: no JVD,  Cardiovascular: S1 and S2 Present, no Murmur,  Respiratory: increased respiratory effort, Bilateral Air entry equal and Decreased, mild Crackles, no wheezes Abdomen: Bowel Sound present, Soft and no tenderness,  Skin: no rashes Extremities: no Pedal edema, no calf tenderness Neurologic: without any new focal findings Gait not checked due to patient safety concerns  Vitals:   10/12/22 0950 10/12/22 1110 10/12/22 1210 10/12/22 1232  BP: 112/66 135/87 133/67 118/72  Pulse: 75 72 71 64  Resp: (!) '22  20 20  '$ Temp:  97.6 F (36.4 C) 97.7 F (36.5 C) 98.2 F (36.8 C)  TempSrc:  Oral  Oral  SpO2: 97% 94% 96% 99%  Weight:        Intake/Output Summary (Last 24 hours) at 10/12/2022 1530 Last data filed at 10/12/2022 2706 Gross per 24 hour  Intake --  Output 1500 ml  Net -1500 ml   Filed Weights   10/10/22 1904  Weight: 54.9 kg    Data Reviewed: I have personally reviewed and interpreted daily labs, tele strips, imagings as discussed above. I reviewed all nursing notes, pharmacy notes, vitals, pertinent old records I have discussed plan of care as described above with RN and patient/family.  CBC: Recent Labs  Lab 10/10/22 1915 10/12/22 0538 10/12/22 0950  WBC 7.7 8.2  --    NEUTROABS 5.0 7.1  --   HGB 7.4* 5.7* 6.3*  HCT 25.2* 19.1* 21.8*  MCV 88.1 85.7  --   PLT 664* 600*  --    Basic Metabolic Panel: Recent Labs  Lab 10/10/22 1915 10/12/22 0538  NA 129* 132*  K 4.0 3.4*  CL 98 103  CO2 16* 19*  GLUCOSE 90 130*  BUN 21 28*  CREATININE 2.07* 1.43*  CALCIUM 9.1 8.2*  MG  --  2.2  PHOS  --  3.2    Studies: No results found.  Scheduled Meds:  allopurinol  150 mg Oral Daily   [START ON 10/13/2022] amLODipine  5 mg Oral Daily   vitamin C  500 mg Oral Daily   aspirin EC  81 mg Oral Daily   cefadroxil  500 mg Oral BID   cyanocobalamin  1,000 mcg Intramuscular Q1200   Followed by   Derrill Memo ON 10/18/2022] vitamin B-12  1,000 mcg Oral Daily   dexamethasone (DECADRON) injection  6 mg Intravenous Q24H   escitalopram  10 mg Oral Daily   folic acid  1 mg Oral Daily   heparin injection (subcutaneous)  5,000 Units Subcutaneous Q8H   insulin aspart  0-6 Units Subcutaneous TID  WC   iron polysaccharides  150 mg Oral Daily   metoprolol succinate  25 mg Oral Daily   multivitamin with minerals  1 tablet Oral Daily   nirmatrelvir/ritonavir (renal dosing)  2 tablet Oral BID   rosuvastatin  20 mg Oral Daily   sodium bicarbonate  650 mg Oral TID   tamsulosin  0.4 mg Oral QPC supper   thiamine  100 mg Oral Daily   Or   thiamine  100 mg Intravenous Daily   traZODone  50 mg Oral QHS   Continuous Infusions:  sodium chloride 75 mL/hr at 10/12/22 0829   PRN Meds: acetaminophen, chlorpheniramine-HYDROcodone, guaiFENesin-dextromethorphan, LORazepam **OR** LORazepam, polyethylene glycol  Time spent: 50 minutes  Author: Val Riles. MD [facility] 10/12/2022 3:30 PM  To reach On-call, see care teams to locate the attending and reach out to them via [email or web address]. If 7PM-7AM, please contact night-coverage If you still have difficulty reaching the attending provider, please page the [facility] (Director on Call) for [facility] on amion for assistance.

## 2022-10-13 DIAGNOSIS — J441 Chronic obstructive pulmonary disease with (acute) exacerbation: Secondary | ICD-10-CM | POA: Diagnosis not present

## 2022-10-13 LAB — CBC WITH DIFFERENTIAL/PLATELET
Abs Immature Granulocytes: 0.16 10*3/uL — ABNORMAL HIGH (ref 0.00–0.07)
Basophils Absolute: 0 10*3/uL (ref 0.0–0.1)
Basophils Relative: 0 %
Eosinophils Absolute: 0 10*3/uL (ref 0.0–0.5)
Eosinophils Relative: 0 %
HCT: 25.4 % — ABNORMAL LOW (ref 39.0–52.0)
Hemoglobin: 7.8 g/dL — ABNORMAL LOW (ref 13.0–17.0)
Immature Granulocytes: 1 %
Lymphocytes Relative: 4 %
Lymphs Abs: 0.6 10*3/uL — ABNORMAL LOW (ref 0.7–4.0)
MCH: 26.1 pg (ref 26.0–34.0)
MCHC: 30.7 g/dL (ref 30.0–36.0)
MCV: 84.9 fL (ref 80.0–100.0)
Monocytes Absolute: 0.5 10*3/uL (ref 0.1–1.0)
Monocytes Relative: 3 %
Neutro Abs: 15.2 10*3/uL — ABNORMAL HIGH (ref 1.7–7.7)
Neutrophils Relative %: 92 %
Platelets: 654 10*3/uL — ABNORMAL HIGH (ref 150–400)
RBC: 2.99 MIL/uL — ABNORMAL LOW (ref 4.22–5.81)
RDW: 18.5 % — ABNORMAL HIGH (ref 11.5–15.5)
WBC: 16.4 10*3/uL — ABNORMAL HIGH (ref 4.0–10.5)
nRBC: 0.7 % — ABNORMAL HIGH (ref 0.0–0.2)

## 2022-10-13 LAB — GLUCOSE, CAPILLARY
Glucose-Capillary: 101 mg/dL — ABNORMAL HIGH (ref 70–99)
Glucose-Capillary: 140 mg/dL — ABNORMAL HIGH (ref 70–99)
Glucose-Capillary: 157 mg/dL — ABNORMAL HIGH (ref 70–99)
Glucose-Capillary: 120 mg/dL — ABNORMAL HIGH (ref 70–99)
Glucose-Capillary: 145 mg/dL — ABNORMAL HIGH (ref 70–99)

## 2022-10-13 LAB — COMPREHENSIVE METABOLIC PANEL
ALT: 10 U/L (ref 0–44)
AST: 15 U/L (ref 15–41)
Albumin: 3.3 g/dL — ABNORMAL LOW (ref 3.5–5.0)
Alkaline Phosphatase: 91 U/L (ref 38–126)
Anion gap: 9 (ref 5–15)
BUN: 28 mg/dL — ABNORMAL HIGH (ref 8–23)
CO2: 22 mmol/L (ref 22–32)
Calcium: 8.5 mg/dL — ABNORMAL LOW (ref 8.9–10.3)
Chloride: 106 mmol/L (ref 98–111)
Creatinine, Ser: 1.38 mg/dL — ABNORMAL HIGH (ref 0.61–1.24)
GFR, Estimated: 55 mL/min — ABNORMAL LOW (ref >60)
Glucose, Bld: 140 mg/dL — ABNORMAL HIGH (ref 70–99)
Potassium: 3.6 mmol/L (ref 3.5–5.1)
Sodium: 137 mmol/L (ref 135–145)
Total Bilirubin: 1 mg/dL (ref 0.3–1.2)
Total Protein: 6 g/dL — ABNORMAL LOW (ref 6.5–8.1)

## 2022-10-13 LAB — HEMOGLOBIN A1C
Hgb A1c MFr Bld: 5.8 % — ABNORMAL HIGH (ref 4.8–5.6)
Mean Plasma Glucose: 120 mg/dL

## 2022-10-13 LAB — PHOSPHORUS: Phosphorus: 2.7 mg/dL (ref 2.5–4.6)

## 2022-10-13 LAB — C-REACTIVE PROTEIN: CRP: <0.5 mg/dL (ref <1.0)

## 2022-10-13 LAB — MAGNESIUM: Magnesium: 2.2 mg/dL (ref 1.7–2.4)

## 2022-10-13 MED ORDER — CYANOCOBALAMIN 1000 MCG PO TABS: 1000.0000 ug | ORAL_TABLET | Freq: Every day | ORAL | 0 refills | Status: AC

## 2022-10-13 MED ORDER — POLYETHYLENE GLYCOL 3350 17 G PO PACK
17.0000 g | PACK | Freq: Every day | ORAL | Status: DC
  Administered 2022-10-13 – 2022-10-15 (×3): 17 g via ORAL
  Filled 2022-10-13 (×3): qty 1

## 2022-10-13 MED ORDER — NIRMATRELVIR/RITONAVIR (PAXLOVID) TABLET (RENAL DOSING): 2.0000 | ORAL_TABLET | Freq: Two times a day (BID) | ORAL | 0 refills | Status: DC

## 2022-10-13 MED ORDER — CHLORHEXIDINE GLUCONATE CLOTH 2 % EX PADS
6.0000 | MEDICATED_PAD | Freq: Every day | CUTANEOUS | Status: DC
  Administered 2022-10-13 – 2022-10-15 (×2): 6 via TOPICAL

## 2022-10-13 MED ORDER — GUAIFENESIN-DM 100-10 MG/5ML PO SYRP: 10.0000 mL | ORAL_SOLUTION | ORAL | 0 refills | Status: DC | PRN

## 2022-10-13 MED ORDER — POLYSACCHARIDE IRON COMPLEX 150 MG PO CAPS: 150.0000 mg | ORAL_CAPSULE | Freq: Every day | ORAL | 0 refills | Status: DC

## 2022-10-13 MED ORDER — TAMSULOSIN HCL 0.4 MG PO CAPS: 0.4000 mg | ORAL_CAPSULE | Freq: Every day | ORAL | 2 refills | Status: AC

## 2022-10-13 MED ORDER — DEXAMETHASONE 6 MG PO TABS: 6.0000 mg | ORAL_TABLET | Freq: Every day | ORAL | 0 refills | Status: DC

## 2022-10-13 MED ORDER — PANTOPRAZOLE SODIUM 40 MG PO TBEC
40.0000 mg | DELAYED_RELEASE_TABLET | Freq: Every day | ORAL | Status: DC
  Administered 2022-10-13 – 2022-10-15 (×3): 40 mg via ORAL
  Filled 2022-10-13 (×3): qty 1

## 2022-10-13 MED ORDER — BISACODYL 10 MG RE SUPP
10.0000 mg | Freq: Every day | RECTAL | Status: DC | PRN
  Filled 2022-10-13: qty 1

## 2022-10-13 MED ORDER — BISACODYL 5 MG PO TBEC
5.0000 mg | DELAYED_RELEASE_TABLET | Freq: Every day | ORAL | Status: DC | PRN
  Administered 2022-10-14: 5 mg via ORAL
  Filled 2022-10-13: qty 1

## 2022-10-13 NOTE — Plan of Care (Signed)
  Problem: Education: Goal: Knowledge of risk factors and measures for prevention of condition will improve Outcome: Progressing   Problem: Coping: Goal: Psychosocial and spiritual needs will be supported Outcome: Progressing   Problem: Respiratory: Goal: Will maintain a patent airway Outcome: Progressing Goal: Complications related to the disease process, condition or treatment will be avoided or minimized Outcome: Progressing   Problem: Education: Goal: Ability to describe self-care measures that may prevent or decrease complications (Diabetes Survival Skills Education) will improve Outcome: Progressing Goal: Individualized Educational Video(s) Outcome: Progressing   Problem: Coping: Goal: Ability to adjust to condition or change in health will improve Outcome: Progressing   Problem: Fluid Volume: Goal: Ability to maintain a balanced intake and output will improve Outcome: Progressing   Problem: Health Behavior/Discharge Planning: Goal: Ability to identify and utilize available resources and services will improve Outcome: Progressing Goal: Ability to manage health-related needs will improve Outcome: Progressing   Problem: Metabolic: Goal: Ability to maintain appropriate glucose levels will improve Outcome: Progressing   Problem: Nutritional: Goal: Maintenance of adequate nutrition will improve Outcome: Progressing Goal: Progress toward achieving an optimal weight will improve Outcome: Progressing   Problem: Skin Integrity: Goal: Risk for impaired skin integrity will decrease Outcome: Progressing   Problem: Tissue Perfusion: Goal: Adequacy of tissue perfusion will improve Outcome: Progressing   Problem: Education: Goal: Knowledge of General Education information will improve Description: Including pain rating scale, medication(s)/side effects and non-pharmacologic comfort measures Outcome: Progressing   Problem: Health Behavior/Discharge Planning: Goal:  Ability to manage health-related needs will improve Outcome: Progressing   Problem: Clinical Measurements: Goal: Ability to maintain clinical measurements within normal limits will improve Outcome: Progressing Goal: Will remain free from infection Outcome: Progressing Goal: Diagnostic test results will improve Outcome: Progressing Goal: Respiratory complications will improve Outcome: Progressing Goal: Cardiovascular complication will be avoided Outcome: Progressing   Problem: Activity: Goal: Risk for activity intolerance will decrease Outcome: Progressing   Problem: Nutrition: Goal: Adequate nutrition will be maintained Outcome: Progressing   Problem: Elimination: Goal: Will not experience complications related to bowel motility Outcome: Progressing Goal: Will not experience complications related to urinary retention Outcome: Progressing   Problem: Pain Managment: Goal: General experience of comfort will improve Outcome: Progressing

## 2022-10-13 NOTE — Progress Notes (Signed)
SATURATION QUALIFICATIONS: (This note is used to comply with regulatory documentation for home oxygen)  Patient Saturations on Room Air at Rest = 94 %  Patient Saturations on Room Air while Ambulating = 86%  Patient Saturations on 2 Liters of oxygen while Ambulating = 87%  Please briefly explain why patient needs home oxygen:

## 2022-10-13 NOTE — Progress Notes (Signed)
[facility] Progress Note  Patient: Edward Singleton    BJY:[id number]  DOA: 10/11/2022     Date of Service: the patient was seen and examined on 10/13/2022  Chief Complaint  Patient presents with   Shortness of Breath   Brief hospital course:  HPI: Edward Singleton is a 72 y.o. 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  Patient presented from home on account of acute shortness of breath.  Symptoms consistent with acute COPD exacerbation.  Despite use of his rescue inhalers at home, symptoms did not improve and hence his admission.  He reports recent URI symptoms.  Denies any known sick contacts.  On presentation, patient was noted to be saturating 88% on room air.  Symptoms did improve on 2 L of oxygen.   Workup in the ED revealed patient had COVID infection.  Chest x-ray shows central lobar emphysema changes.  No acute cardiopulmonary process however noted   Assessment and Plan: Acute hypoxic respiratory insufficiency secondary to COPD exacerbation.  COPD exacerbation likely as result of COVID infection.  COPD be optimized with bronchodilators and steroids.   No indication for empiric antibiotics at this time.   Will monitor for improvement.  Leukocytosis most likely due to steroids.  Repeat CBC after 1 week as an outpatient and follow with PCP  COVID-19 infection: Patient is on Paxlovid   Elevated creatinine, baseline creatinine 1.7, presented with creatinine 2.06 on admission CKD stage IIIb Cr 1.43 improving Monitor creatinine level daily  Metabolic ketosis, CO2 level 16 Co 16--19 --22 improved Started oral bicarbonate.  Monitor BMP daily  Hypokalemia, mild, potassium repleted.  Resolved  Urinary retention, Foley catheter was inserted. Bladder scan showed 344-hour urine Started Flomax, recommended  follow-up with urology in 1 week for further management as an outpatient Foley catheter was discontinued on 1/4, follow voiding trial   Hyponatremia could be secondary to EtOH abuse. 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  Recent humeral fracture with multiple surgeries and reinfections.   Long QTc syndrome: EKG on this admission shows QTc within limits.   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  Patient will continue with same.   Frequent falls at home: Patient denies any recent falls.  Sister who is his primary caretaker however concerned about right.   X-ray of the right humerus: Right shoulder arthroplasty with additional findings suspicious for lucency along the humeral component.   Recommended to follow with orthopedic surgery as an outpatient  HTN-resume home medications as appropriate.     Type 2 diabetes mellitus: On metformin at home . SSI for optimization of blood glucose   CAD/PAD- continue home regimen    Anemia of chronic disease, iron deficiency and vitamin B12 deficiency Transferrin saturation 7% Continue oral iron supplement with vitamin C  No indication for PRBC transfusion at this time.  Will monitor H&H.  No obvious evidence of bleeding at this time.   Vitamin B12 level 141, target >400, started vitamin B12 1000 mcg IM injection daily during hospital stay followed by oral supplement.  Need to follow with PCP to repeat iron profile and B12 level after 3 to 6 months. 1/3 Hb 6.3, dropped, transfuse 1 unit of PRBC Hb 7.8 posttransfusion  Patient denies any GI bleeding, started pantoprazole 40 mg p.o. daily as prophylactic Check FOBT   Thrombocytosis, most likely reactive, monitor platelet count daily  Hx of EtOH abuse -denies any recent exposure.  CIWA protocol as  needed   Body mass index is 19.53 kg/m.  Interventions:       Diet: Carb modified DVT Prophylaxis: Subcutaneous Heparin    Advance goals of care discussion: DNR  Family Communication: family was NOT present at bedside, at the time of interview.  The pt provided permission to discuss medical plan with the family. Opportunity was given to ask question and all questions were answered satisfactorily.  1/4 discussed with patient's sister over the phone  Disposition:  Pt is from Home, admitted with COVID viral infection, still has COVID infection on oxygen, Hb dropped, received 1 unit of PRBC, which precludes a safe discharge. Discharge to home, when stable, may need 1-2 more days.  Subjective: No significant events overnight, patient is feeling better, denies any worsening of shortness of breath, requesting to be discharged home.  Patient is still requiring supplemental O2 admission 2 L, O2 saturation dropped.  Discussed with patient's sister who is POA, she spoke to him and he agreed to stay 1 more night.  We will monitor CBC and O2 saturation, plan for DC tomorrow a.m. if remains stable.   Physical Exam: General:  alert oriented to time, place, and person.  Appear in mild distress, affect appropriate Eyes: PERRLA ENT: Oral Mucosa Clear, moist  Neck: no JVD,  Cardiovascular: S1 and S2 Present, no Murmur,  Respiratory: increased respiratory effort, Bilateral Air entry equal and Decreased, mild Crackles, no wheezes Abdomen: Bowel Sound present, Soft and no tenderness,  Skin: no rashes Extremities: no Pedal edema, no calf tenderness Neurologic: without any new focal findings Gait not checked due to patient safety concerns  Vitals:   10/13/22 0600 10/13/22 0815 10/13/22 0900 10/13/22 1158  BP:  126/77  113/66  Pulse:  70  72  Resp:  16  16  Temp:  (!) 97.5 F (36.4 C)  (!) 97.5 F (36.4 C)  TempSrc:      SpO2: 91% 97% 96% 95%  Weight:        Intake/Output Summary (Last 24  hours) at 10/13/2022 1345 Last data filed at 10/13/2022 1100 Gross per 24 hour  Intake 360 ml  Output 2600 ml  Net -2240 ml   Filed Weights   10/10/22 1904  Weight: 54.9 kg    Data Reviewed: I have personally reviewed and interpreted daily labs, tele strips, imagings as discussed above. I reviewed all nursing notes, pharmacy notes, vitals, pertinent old records I have discussed plan of care as described above with RN and patient/family.  CBC: Recent Labs  Lab 10/10/22 1915 10/12/22 0538 10/12/22 0950 10/12/22 2012 10/13/22 0656  WBC 7.7 8.2  --   --  16.4*  NEUTROABS 5.0 7.1  --   --  15.2*  HGB 7.4* 5.7* 6.3* 7.8* 7.8*  HCT 25.2* 19.1* 21.8* 24.5* 25.4*  MCV 88.1 85.7  --   --  84.9  PLT 664* 600*  --   --  482*   Basic Metabolic Panel: Recent Labs  Lab 10/10/22 1915 10/12/22 0538 10/13/22 0656  NA 129* 132* 137  K 4.0 3.4* 3.6  CL 98 103 106  CO2 16* 19* 22  GLUCOSE 90 130* 140*  BUN 21 28* 28*  CREATININE 2.07* 1.43* 1.38*  CALCIUM 9.1 8.2* 8.5*  MG  --  2.2 2.2  PHOS  --  3.2 2.7    Studies: No results found.  Scheduled Meds:  allopurinol  150 mg Oral Daily   amLODipine  5 mg Oral Daily   vitamin C  500 mg Oral Daily   cefadroxil  500 mg Oral BID   Chlorhexidine Gluconate Cloth  6 each Topical Daily   cyanocobalamin  1,000 mcg Intramuscular Q1200   Followed by   Derrill Memo ON 10/18/2022] vitamin B-12  1,000 mcg Oral Daily   dexamethasone (DECADRON) injection  6 mg Intravenous Q24H   escitalopram  10 mg Oral Daily   folic acid  1 mg Oral Daily   heparin injection (subcutaneous)  5,000 Units Subcutaneous Q8H   insulin aspart  0-6 Units Subcutaneous TID WC   iron polysaccharides  150 mg Oral Daily   metoprolol succinate  25 mg Oral Daily   multivitamin with minerals  1 tablet Oral Daily   nirmatrelvir/ritonavir (renal dosing)  2 tablet Oral BID   rosuvastatin  20 mg Oral Daily   sodium bicarbonate  650 mg Oral TID   tamsulosin  0.4 mg Oral QPC supper    thiamine  100 mg Oral Daily   Or   thiamine  100 mg Intravenous Daily   traZODone  50 mg Oral QHS   Continuous Infusions:  sodium chloride 75 mL/hr at 10/13/22 0856   PRN Meds: acetaminophen, chlorpheniramine-HYDROcodone, guaiFENesin-dextromethorphan, LORazepam **OR** LORazepam, polyethylene glycol  Time spent: 50 minutes  Author: Val Riles. MD [facility] 10/13/2022 1:45 PM  To reach On-call, see care teams to locate the attending and reach out to them via [email or web address]. If 7PM-7AM, please contact night-coverage If you still have difficulty reaching the attending provider, please page the [facility] (Director on Call) for [facility] on amion for assistance.

## 2022-10-14 DIAGNOSIS — F411 Generalized anxiety disorder: Secondary | ICD-10-CM

## 2022-10-14 DIAGNOSIS — J441 Chronic obstructive pulmonary disease with (acute) exacerbation: Secondary | ICD-10-CM | POA: Diagnosis not present

## 2022-10-14 LAB — CBC WITH DIFFERENTIAL/PLATELET
Abs Immature Granulocytes: 0.27 10*3/uL — ABNORMAL HIGH (ref 0.00–0.07)
Basophils Absolute: 0 10*3/uL (ref 0.0–0.1)
Basophils Relative: 0 %
Eosinophils Absolute: 0 10*3/uL (ref 0.0–0.5)
Eosinophils Relative: 0 %
HCT: 22.5 % — ABNORMAL LOW (ref 39.0–52.0)
Hemoglobin: 7 g/dL — ABNORMAL LOW (ref 13.0–17.0)
Immature Granulocytes: 1 %
Lymphocytes Relative: 3 %
Lymphs Abs: 0.6 10*3/uL — ABNORMAL LOW (ref 0.7–4.0)
MCH: 26.3 pg (ref 26.0–34.0)
MCHC: 31.1 g/dL (ref 30.0–36.0)
MCV: 84.6 fL (ref 80.0–100.0)
Monocytes Absolute: 0.7 10*3/uL (ref 0.1–1.0)
Monocytes Relative: 4 %
Neutro Abs: 18 10*3/uL — ABNORMAL HIGH (ref 1.7–7.7)
Neutrophils Relative %: 92 %
Platelets: 599 10*3/uL — ABNORMAL HIGH (ref 150–400)
RBC: 2.66 MIL/uL — ABNORMAL LOW (ref 4.22–5.81)
RDW: 18.5 % — ABNORMAL HIGH (ref 11.5–15.5)
WBC: 19.5 10*3/uL — ABNORMAL HIGH (ref 4.0–10.5)
nRBC: 0.5 % — ABNORMAL HIGH (ref 0.0–0.2)

## 2022-10-14 LAB — MAGNESIUM: Magnesium: 2.1 mg/dL (ref 1.7–2.4)

## 2022-10-14 LAB — COMPREHENSIVE METABOLIC PANEL
ALT: 11 U/L (ref 0–44)
AST: 16 U/L (ref 15–41)
Albumin: 3 g/dL — ABNORMAL LOW (ref 3.5–5.0)
Alkaline Phosphatase: 81 U/L (ref 38–126)
Anion gap: 6 (ref 5–15)
BUN: 27 mg/dL — ABNORMAL HIGH (ref 8–23)
CO2: 23 mmol/L (ref 22–32)
Calcium: 7.9 mg/dL — ABNORMAL LOW (ref 8.9–10.3)
Chloride: 104 mmol/L (ref 98–111)
Creatinine, Ser: 1.22 mg/dL (ref 0.61–1.24)
GFR, Estimated: >60 mL/min (ref >60)
Glucose, Bld: 127 mg/dL — ABNORMAL HIGH (ref 70–99)
Potassium: 3 mmol/L — ABNORMAL LOW (ref 3.5–5.1)
Sodium: 133 mmol/L — ABNORMAL LOW (ref 135–145)
Total Bilirubin: 0.7 mg/dL (ref 0.3–1.2)
Total Protein: 5.4 g/dL — ABNORMAL LOW (ref 6.5–8.1)

## 2022-10-14 LAB — GLUCOSE, CAPILLARY
Glucose-Capillary: 130 mg/dL — ABNORMAL HIGH (ref 70–99)
Glucose-Capillary: 137 mg/dL — ABNORMAL HIGH (ref 70–99)
Glucose-Capillary: 166 mg/dL — ABNORMAL HIGH (ref 70–99)
Glucose-Capillary: 160 mg/dL — ABNORMAL HIGH (ref 70–99)

## 2022-10-14 LAB — RETICULOCYTES
Immature Retic Fract: 41.9 % — ABNORMAL HIGH (ref 2.3–15.9)
RBC.: 2.64 MIL/uL — ABNORMAL LOW (ref 4.22–5.81)
Retic Count, Absolute: 50.4 10*3/uL (ref 19.0–186.0)
Retic Ct Pct: 1.9 % (ref 0.4–3.1)

## 2022-10-14 LAB — C-REACTIVE PROTEIN: CRP: <0.5 mg/dL (ref <1.0)

## 2022-10-14 LAB — PREPARE RBC (CROSSMATCH)

## 2022-10-14 LAB — PHOSPHORUS: Phosphorus: 2.1 mg/dL — ABNORMAL LOW (ref 2.5–4.6)

## 2022-10-14 MED ORDER — ENOXAPARIN SODIUM 40 MG/0.4ML IJ SOSY
40.0000 mg | PREFILLED_SYRINGE | INTRAMUSCULAR | Status: DC
  Administered 2022-10-15: 40 mg via SUBCUTANEOUS
  Filled 2022-10-14: qty 0.4

## 2022-10-14 MED ORDER — CALCIUM CARBONATE ANTACID 500 MG PO CHEW
1.0000 | CHEWABLE_TABLET | Freq: Three times a day (TID) | ORAL | Status: DC | PRN
  Filled 2022-10-14: qty 1

## 2022-10-14 MED ORDER — POTASSIUM PHOSPHATES 15 MMOLE/5ML IV SOLN
30.0000 mmol | Freq: Once | INTRAVENOUS | Status: AC
  Administered 2022-10-14: 30 mmol via INTRAVENOUS
  Filled 2022-10-14: qty 10

## 2022-10-14 MED ORDER — GABAPENTIN 100 MG PO CAPS: 100.0000 mg | ORAL_CAPSULE | Freq: Three times a day (TID) | ORAL | Status: DC | PRN

## 2022-10-14 MED ORDER — POTASSIUM CHLORIDE CRYS ER 20 MEQ PO TBCR
40.0000 meq | EXTENDED_RELEASE_TABLET | Freq: Once | ORAL | Status: AC
  Administered 2022-10-14: 40 meq via ORAL
  Filled 2022-10-14: qty 2

## 2022-10-14 MED ORDER — SODIUM CHLORIDE 0.9% IV SOLUTION: Freq: Once | INTRAVENOUS | Status: AC

## 2022-10-14 MED ORDER — POTASSIUM CHLORIDE 10 MEQ/100ML IV SOLN
10.0000 meq | INTRAVENOUS | Status: AC
  Administered 2022-10-14 (×4): 10 meq via INTRAVENOUS
  Filled 2022-10-14 (×3): qty 100

## 2022-10-14 NOTE — Progress Notes (Signed)
SATURATION QUALIFICATIONS:   Patient Saturations on Room Air at Rest = 92%  Patient Saturations on Room Air while Ambulating = 88%   Please briefly explain why patient needs home oxygen: RN walked with the pt around the room but only for few steps. Pt refused to walked further. No SOB was noted. MD made-aware.

## 2022-10-14 NOTE — Progress Notes (Signed)
Pt refused all the treatment including telemetry and IV K and IV K Phosphorus, and wanted to go home. Pt said he called his sister and agreed to go home, but RN called the sister [name] and she dont feel like pt is safe to go home with Hgb 7.0 and low potassium. MD made-aware.

## 2022-10-14 NOTE — Consult Note (Signed)
[place] [facility] Consult   Reason for Consult:  depression and anxiety Referring Physician:  Dr. Dwyane Dee Patient Identification: Edward Singleton MRN:  [id number] Principal Diagnosis: COPD exacerbation [facility]) Diagnosis:  Principal Problem:   COPD exacerbation ([place]) Active Problems:   Anxiety state   Total Time spent with patient: 45 minutes  Subjective:   Edward Singleton is a 72 y.o. male patient admitted with COPD and COVID.  HPI:  72 yo male admitted for COPD and COVID issues, consult placed for depression and anxiety.  On assessment, he is calmly lying in bed looking out the window.  When asked about his depression, he stated, "I ain't got none."  High anxiety with no panic attacks.  Sleep is "fair", appetite is "ok".  Denies psychosis, paranoia, and cravings for substances.  He is frustrated as he feels "fine" and does not understand why he has to stay in the hospital despite explanations.    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   Diabetes mellitus without complication ([place])    Gout    Gout    Headache    Hypercholesteremia    Hypertension    Kidney stones    Left flank pain, chronic    Pelvic fracture ([place]) 11/17/2014   Pneumonia    Prolonged QT syndrome    Septic arthritis of shoulder, right ([place]) 05/11/2022   White matter disease     Past Surgical History:  Procedure Laterality Date   COLONOSCOPY WITH PROPOFOL N/A 07/06/2022   Procedure: COLONOSCOPY WITH PROPOFOL;  Surgeon: Jonathon Bellows, MD;  Location: [facility] ENDOSCOPY;  Service: Gastroenterology;  Laterality: N/A;   ESOPHAGOGASTRODUODENOSCOPY Left 03/18/2019   Procedure: ESOPHAGOGASTRODUODENOSCOPY  (EGD);  Surgeon: Jonathon Bellows, MD;  Location[facility] ENDOSCOPY;  Service: Gastroenterology;  Laterality: Left;   ESOPHAGOGASTRODUODENOSCOPY N/A 07/06/2022   Procedure: ESOPHAGOGASTRODUODENOSCOPY (EGD);  Surgeon: Jonathon Bellows, MD;  Location: [facility] ENDOSCOPY;  Service: Gastroenterology;  Laterality: N/A;   FRACTURE SURGERY     left renal stent placement  10/11/2011   LITHOTRIPSY     ORIF HUMERUS FRACTURE Left 11/18/2014   Procedure: OPEN REDUCTION INTERNAL FIXATION (ORIF) DISTAL HUMERUS FRACTURE;  Surgeon: Rozanna Box, MD;  Location: [place];  Service: Orthopedics;  Laterality: Left;   REVERSE SHOULDER ARTHROPLASTY Right 10/27/2021   Procedure: REVERSE SHOULDER ARTHROPLASTY;  Surgeon: Hiram Gash, MD;  Location: [place];  Service: Orthopedics;  Laterality: Right;   REVISION TOTAL SHOULDER TO REVERSE TOTAL SHOULDER Right 05/11/2022   Procedure: REVISION TOTAL SHOULDER TO REVERSE TOTAL SHOULDER;  Surgeon: Hiram Gash, MD;  Location: [facility];  Service: Orthopedics;  Laterality: Right;   SPLENECTOMY     Family History:  Family History  Problem Relation Age of Onset   Heart disease Mother    Heart disease Father    Diabetes Father    Nephrolithiasis Paternal Grandfather    Kidney disease Neg Hx    Prostate cancer Neg Hx    Family Psychiatric  History: none Social History:  Social History   Substance and Sexual Activity  Alcohol Use Not Currently   Alcohol/week: 42.0 standard drinks of alcohol  Types: 42 Cans of beer per week   Comment: quit june 4th     Social History   Substance and Sexual Activity  Drug Use No    Social History   Socioeconomic History   Marital status: Single    Spouse name: Not on file   Number of children: Not on file   Years of education: Not on file   Highest education level: Not on file  Occupational History   Not on file  Tobacco Use   Smoking status: Every Day    Packs/day: 1.00    Years: 50.00    Total pack years: 50.00    Types: Cigarettes    Smokeless tobacco: Former    Types: Nurse, children's Use: Never used  Substance and Sexual Activity   Alcohol use: Not Currently    Alcohol/week: 42.0 standard drinks of alcohol    Types: 42 Cans of beer per week    Comment: quit june 4th   Drug use: No   Sexual activity: Not on file  Other Topics Concern   Not on file  Social History Narrative   Not on file   Social Determinants of Health   Financial Resource Strain: Not on file  Food Insecurity: No Food Insecurity (10/12/2022)   Hunger Vital Sign    Worried About Running Out of Food in the Last Year: Never true    Ran Out of Food in the Last Year: Never true  Transportation Needs: No Transportation Needs (10/12/2022)   PRAPARE - Hydrologist (Medical): No    Lack of Transportation (Non-Medical): No  Physical Activity: Not on file  Stress: Not on file  Social Connections: Not on file   Additional Social History:    Allergies:  No Known Allergies  Labs:  Results for orders placed or performed during the hospital encounter of 10/11/22 (from the past 48 hour(s))  Glucose, capillary     Status: Abnormal   Collection Time: 10/12/22  4:14 PM  Result Value Ref Range   Glucose-Capillary 125 (H) 70 - 99 mg/dL    Comment: Glucose reference range applies only to samples taken after fasting for at least 8 hours.  Hemoglobin and hematocrit, blood     Status: Abnormal   Collection Time: 10/12/22  8:12 PM  Result Value Ref Range   Hemoglobin 7.8 (L) 13.0 - 17.0 g/dL   HCT 24.5 (L) 39.0 - 52.0 %    Comment: Performed at [facility], [address] [place], [place] [address]  Glucose, capillary     Status: Abnormal   Collection Time: 10/13/22  4:58 AM  Result Value Ref Range   Glucose-Capillary 101 (H) 70 - 99 mg/dL    Comment: Glucose reference range applies only to samples taken after fasting for at least 8 hours.  C-reactive protein     Status: None   Collection Time: 10/13/22   6:56 AM  Result Value Ref Range   CRP <0.5 <1.0 mg/dL    Comment: Performed at [facility], [place] [address], [place], [place] [address]  CBC with Differential/Platelet     Status: Abnormal   Collection Time: 10/13/22  6:56 AM  Result Value Ref Range   WBC 16.4 (H) 4.0 - 10.5 K/uL   RBC 2.99 (L) 4.22 - 5.81 MIL/uL   Hemoglobin 7.8 (L) 13.0 - 17.0 g/dL   HCT 25.4 (L) 39.0 - 52.0 %   MCV 84.9  80.0 - 100.0 fL   MCH 26.1 26.0 - 34.0 pg   MCHC 30.7 30.0 - 36.0 g/dL   RDW 18.5 (H) 11.5 - 15.5 %   Platelets 654 (H) 150 - 400 K/uL   nRBC 0.7 (H) 0.0 - 0.2 %   Neutrophils Relative % 92 %   Neutro Abs 15.2 (H) 1.7 - 7.7 K/uL   Lymphocytes Relative 4 %   Lymphs Abs 0.6 (L) 0.7 - 4.0 K/uL   Monocytes Relative 3 %   Monocytes Absolute 0.5 0.1 - 1.0 K/uL   Eosinophils Relative 0 %   Eosinophils Absolute 0.0 0.0 - 0.5 K/uL   Basophils Relative 0 %   Basophils Absolute 0.0 0.0 - 0.1 K/uL   Immature Granulocytes 1 %   Abs Immature Granulocytes 0.16 (H) 0.00 - 0.07 K/uL    Comment: Performed at [facility], [address] [place], [place] [address]  Comprehensive metabolic panel     Status: Abnormal   Collection Time: 10/13/22  6:56 AM  Result Value Ref Range   Sodium 137 135 - 145 mmol/L   Potassium 3.6 3.5 - 5.1 mmol/L   Chloride 106 98 - 111 mmol/L   CO2 22 22 - 32 mmol/L   Glucose, Bld 140 (H) 70 - 99 mg/dL    Comment: Glucose reference range applies only to samples taken after fasting for at least 8 hours.   BUN 28 (H) 8 - 23 mg/dL   Creatinine, Ser 1.38 (H) 0.61 - 1.24 mg/dL   Calcium 8.5 (L) 8.9 - 10.3 mg/dL   Total Protein 6.0 (L) 6.5 - 8.1 g/dL   Albumin 3.3 (L) 3.5 - 5.0 g/dL   AST 15 15 - 41 U/L   ALT 10 0 - 44 U/L   Alkaline Phosphatase 91 38 - 126 U/L   Total Bilirubin 1.0 0.3 - 1.2 mg/dL   GFR, Estimated 55 (L) >60 mL/min    Comment: (NOTE) Calculated using the CKD-EPI Creatinine Equation (2021)    Anion gap 9 5 - 15    Comment: Performed at [facility], [address] [place], [place] [address]  Magnesium     Status: None   Collection Time: 10/13/22  6:56 AM  Result Value Ref Range   Magnesium 2.2 1.7 - 2.4 mg/dL    Comment: Performed at [facility], [address] [place], [place] [address]  Phosphorus     Status: None   Collection Time: 10/13/22  6:56 AM  Result Value Ref Range   Phosphorus 2.7 2.5 - 4.6 mg/dL    Comment: Performed at [facility], [address] [place], [place] [address]  Glucose, capillary     Status: Abnormal   Collection Time: 10/13/22  8:16 AM  Result Value Ref Range   Glucose-Capillary 140 (H) 70 - 99 mg/dL    Comment: Glucose reference range applies only to samples taken after fasting for at least 8 hours.  Glucose, capillary     Status: Abnormal   Collection Time: 10/13/22 11:58 AM  Result Value Ref Range   Glucose-Capillary 157 (H) 70 - 99 mg/dL    Comment: Glucose reference range applies only to samples taken after fasting for at least 8 hours.  Glucose, capillary     Status: Abnormal   Collection Time: 10/13/22  3:37 PM  Result Value Ref Range   Glucose-Capillary 120 (H) 70 - 99 mg/dL    Comment: Glucose reference range applies only to samples  taken after fasting for at least 8 hours.  Glucose, capillary     Status: Abnormal   Collection Time: 10/13/22  9:50 PM  Result Value Ref Range   Glucose-Capillary 145 (H) 70 - 99 mg/dL    Comment: Glucose reference range applies only to samples taken after fasting for at least 8 hours.  Reticulocytes     Status: Abnormal   Collection Time: 10/14/22  4:27 AM  Result Value Ref Range   Retic Ct Pct 1.9 0.4 - 3.1 %   RBC. 2.64 (L) 4.22 - 5.81 MIL/uL   Retic Count, Absolute 50.4 19.0 - 186.0 K/uL   Immature Retic Fract 41.9 (H) 2.3 - 15.9 %    Comment: Performed at [facility], [address] [place], [place] [address]  CBC with Differential/Platelet     Status: Abnormal   Collection Time: 10/14/22  4:34 AM   Result Value Ref Range   WBC 19.5 (H) 4.0 - 10.5 K/uL   RBC 2.66 (L) 4.22 - 5.81 MIL/uL   Hemoglobin 7.0 (L) 13.0 - 17.0 g/dL   HCT 22.5 (L) 39.0 - 52.0 %   MCV 84.6 80.0 - 100.0 fL   MCH 26.3 26.0 - 34.0 pg   MCHC 31.1 30.0 - 36.0 g/dL   RDW 18.5 (H) 11.5 - 15.5 %   Platelets 599 (H) 150 - 400 K/uL   nRBC 0.5 (H) 0.0 - 0.2 %   Neutrophils Relative % 92 %   Neutro Abs 18.0 (H) 1.7 - 7.7 K/uL   Lymphocytes Relative 3 %   Lymphs Abs 0.6 (L) 0.7 - 4.0 K/uL   Monocytes Relative 4 %   Monocytes Absolute 0.7 0.1 - 1.0 K/uL   Eosinophils Relative 0 %   Eosinophils Absolute 0.0 0.0 - 0.5 K/uL   Basophils Relative 0 %   Basophils Absolute 0.0 0.0 - 0.1 K/uL   Immature Granulocytes 1 %   Abs Immature Granulocytes 0.27 (H) 0.00 - 0.07 K/uL    Comment: Performed at [facility], [address] [place], [place] [address]  Comprehensive metabolic panel     Status: Abnormal   Collection Time: 10/14/22  4:34 AM  Result Value Ref Range   Sodium 133 (L) 135 - 145 mmol/L   Potassium 3.0 (L) 3.5 - 5.1 mmol/L   Chloride 104 98 - 111 mmol/L   CO2 23 22 - 32 mmol/L   Glucose, Bld 127 (H) 70 - 99 mg/dL    Comment: Glucose reference range applies only to samples taken after fasting for at least 8 hours.   BUN 27 (H) 8 - 23 mg/dL   Creatinine, Ser 1.22 0.61 - 1.24 mg/dL   Calcium 7.9 (L) 8.9 - 10.3 mg/dL   Total Protein 5.4 (L) 6.5 - 8.1 g/dL   Albumin 3.0 (L) 3.5 - 5.0 g/dL   AST 16 15 - 41 U/L   ALT 11 0 - 44 U/L   Alkaline Phosphatase 81 38 - 126 U/L   Total Bilirubin 0.7 0.3 - 1.2 mg/dL   GFR, Estimated >60 >60 mL/min    Comment: (NOTE) Calculated using the CKD-EPI Creatinine Equation (2021)    Anion gap 6 5 - 15    Comment: Performed at [facility], [address], [place], [place] [address]  Magnesium     Status: None   Collection Time: 10/14/22  4:34 AM  Result Value Ref Range   Magnesium 2.1 1.7 - 2.4 mg/dL    Comment:  Performed at [facility], [address] [place], [place] [address]  Phosphorus     Status: Abnormal   Collection Time: 10/14/22  4:34 AM  Result Value Ref Range   Phosphorus 2.1 (L) 2.5 - 4.6 mg/dL    Comment: Performed at [facility], [address] [place], [place] [address]  Glucose, capillary     Status: Abnormal   Collection Time: 10/14/22  8:04 AM  Result Value Ref Range   Glucose-Capillary 130 (H) 70 - 99 mg/dL    Comment: Glucose reference range applies only to samples taken after fasting for at least 8 hours.  Glucose, capillary     Status: Abnormal   Collection Time: 10/14/22 12:09 PM  Result Value Ref Range   Glucose-Capillary 137 (H) 70 - 99 mg/dL    Comment: Glucose reference range applies only to samples taken after fasting for at least 8 hours.    Current Facility-Administered Medications  Medication Dose Route Frequency Provider Last Rate Last Admin   0.9 %  sodium chloride infusion   Intravenous Continuous Acheampong, Warnell Bureau, MD 75 mL/hr at 10/13/22 2223 New Bag at 10/13/22 2223   acetaminophen (TYLENOL) tablet 650 mg  650 mg Oral Q6H PRN Artist [place], MD       allopurinol (ZYLOPRIM) tablet 150 mg  150 mg Oral Daily Acheampong, Warnell Bureau, MD   150 mg at 10/14/22 7989   amLODipine (NORVASC) tablet 5 mg  5 mg Oral Daily Val Riles, MD   5 mg at 10/14/22 2119   ascorbic acid (VITAMIN C) tablet 500 mg  500 mg Oral Daily Val Riles, MD   500 mg at 10/14/22 4174   bisacodyl (DULCOLAX) EC tablet 5 mg  5 mg Oral Daily PRN Val Riles, MD   5 mg at 10/14/22 1000   bisacodyl (DULCOLAX) suppository 10 mg  10 mg Rectal Daily PRN Val Riles, MD       cefadroxil (DURICEF) capsule 500 mg  500 mg Oral BID Artist [place], MD   500 mg at 10/14/22 0825   Chlorhexidine Gluconate Cloth 2 % PADS 6 each  6 each Topical Daily Acheampong, Warnell Bureau, MD   6 each at 10/13/22 1121   chlorpheniramine-HYDROcodone (TUSSIONEX) 10-8 MG/5ML suspension 5 mL  5 mL Oral Q12H PRN Acheampong, Warnell Bureau, MD        cyanocobalamin (VITAMIN B12) injection 1,000 mcg  1,000 mcg Intramuscular Q1200 Val Riles, MD   1,000 mcg at 10/13/22 1121   Followed by   Derrill Memo ON 10/18/2022] cyanocobalamin (VITAMIN B12) tablet 1,000 mcg  1,000 mcg Oral Daily Val Riles, MD       dexamethasone (DECADRON) injection 6 mg  6 mg Intravenous Q24H Artist [place], MD   6 mg at 10/14/22 0102   escitalopram (LEXAPRO) tablet 10 mg  10 mg Oral Daily Acheampong, Warnell Bureau, MD   10 mg at 05/24/47 1856   folic acid (FOLVITE) tablet 1 mg  1 mg Oral Daily Acheampong, Warnell Bureau, MD   1 mg at 10/14/22 3149   guaiFENesin-dextromethorphan (ROBITUSSIN DM) 100-10 MG/5ML syrup 10 mL  10 mL Oral Q4H PRN Artist [place], MD       heparin injection 5,000 Units  5,000 Units Subcutaneous Q8H Val Riles, MD   5,000 Units at 10/13/22 2217   insulin aspart (novoLOG) injection 0-6 Units  0-6 Units Subcutaneous TID WC Artist [place], MD   1 Units at 10/13/22 1323   iron  polysaccharides (NIFEREX) capsule 150 mg  150 mg Oral Daily Val Riles, MD   150 mg at 10/14/22 0825   metoprolol succinate (TOPROL-XL) 24 hr tablet 25 mg  25 mg Oral Daily Val Riles, MD   25 mg at 10/14/22 1610   multivitamin with minerals tablet 1 tablet  1 tablet Oral Daily Acheampong, Warnell Bureau, MD   1 tablet at 10/14/22 9604   nirmatrelvir/ritonavir (renal dosing) (PAXLOVID) 2 tablet  2 tablet Oral BID Artist [place], MD   2 tablet at 10/14/22 0824   pantoprazole (PROTONIX) EC tablet 40 mg  40 mg Oral Daily Val Riles, MD   40 mg at 10/14/22 5409   polyethylene glycol (MIRALAX / GLYCOLAX) packet 17 g  17 g Oral Daily Val Riles, MD   17 g at 10/14/22 8119   potassium chloride 10 mEq in 100 mL IVPB  10 mEq Intravenous Q1 Hr x 4 Val Riles, MD 100 mL/hr at 10/14/22 1453 10 mEq at 10/14/22 1453   potassium chloride SA (KLOR-CON M) CR tablet 40 mEq  40 mEq Oral Once Val Riles, MD       potassium PHOSPHATE 30 mmol in dextrose 5 % 500 mL infusion  30  mmol Intravenous Once Val Riles, MD 85 mL/hr at 10/14/22 1207 30 mmol at 10/14/22 1207   rosuvastatin (CRESTOR) tablet 20 mg  20 mg Oral Daily Acheampong, Warnell Bureau, MD   20 mg at 10/14/22 1478   sodium bicarbonate tablet 650 mg  650 mg Oral TID Val Riles, MD   650 mg at 10/14/22 2956   tamsulosin (FLOMAX) capsule 0.4 mg  0.4 mg Oral QPC supper Val Riles, MD   0.4 mg at 10/13/22 1842   thiamine (VITAMIN B1) tablet 100 mg  100 mg Oral Daily Artist [place], MD   100 mg at 10/14/22 2130   Or   thiamine (VITAMIN B1) injection 100 mg  100 mg Intravenous Daily Acheampong, Warnell Bureau, MD       traZODone (DESYREL) tablet 50 mg  50 mg Oral QHS Artist [place], MD   50 mg at 10/13/22 2217    Musculoskeletal: Strength & Muscle Tone: within normal limits Gait & Station: normal Patient leans: N/A Psychiatric Specialty Exam: Physical Exam Vitals reviewed.  HENT:     Head: Normocephalic.  Pulmonary:     Effort: Pulmonary effort is normal.  Musculoskeletal:        General: Normal range of motion.     Cervical back: Normal range of motion.  Neurological:     General: No focal deficit present.     Mental Status: He is alert and oriented to person, place, and time.  Psychiatric:        Attention and Perception: Attention and perception normal.        Mood and Affect: Mood is anxious.        Speech: Speech normal.        Behavior: Behavior normal. Behavior is cooperative.        Thought Content: Thought content normal.        Cognition and Memory: Cognition and memory normal.        Judgment: Judgment normal.     Review of Systems  Psychiatric/Behavioral:  The patient is nervous/anxious.   All other systems reviewed and are negative.   Blood pressure 132/66, pulse 71, temperature 98 F (36.7 C), resp. rate 14, weight 54.9 kg, SpO2 92 %.Body mass index is 19.53 kg/m.  General  Appearance: Casual  Eye Contact:  Good  Speech:  Normal Rate  Volume:  Normal  Mood:  Anxious and  Irritable  Affect:  Congruent  Thought Process:  Coherent  Orientation:  Full (Time, Place, and Person)  Thought Content:  Logical  Suicidal Thoughts:  No  Homicidal Thoughts:  No  Memory:  Immediate;   Fair Recent;   Fair Remote;   Fair  Judgement:  Fair  Insight:  Fair  Psychomotor Activity:  Normal  Concentration:  Concentration: Fair and Attention Span: Fair  Recall:  [employer] of Knowledge:  Fair  Language:  Good  Akathisia:  No  Handed:  Right  AIMS (if indicated):     Assets:  Housing Leisure Time Resilience Social Support  ADL's:  Intact  Cognition:  WNL  Sleep:        Physical Exam: Physical Exam Vitals reviewed.  HENT:     Head: Normocephalic.  Pulmonary:     Effort: Pulmonary effort is normal.  Musculoskeletal:        General: Normal range of motion.     Cervical back: Normal range of motion.  Neurological:     General: No focal deficit present.     Mental Status: He is alert and oriented to person, place, and time.  Psychiatric:        Attention and Perception: Attention and perception normal.        Mood and Affect: Mood is anxious.        Speech: Speech normal.        Behavior: Behavior normal. Behavior is cooperative.        Thought Content: Thought content normal.        Cognition and Memory: Cognition and memory normal.        Judgment: Judgment normal.    Review of Systems  Psychiatric/Behavioral:  The patient is nervous/anxious.   All other systems reviewed and are negative.  Blood pressure 132/66, pulse 71, temperature 98 F (36.7 C), resp. rate 14, weight 54.9 kg, SpO2 92 %. Body mass index is 19.53 kg/m.  Treatment Plan Summary: Anxiety state: Gabapentin 100 mg TID PRN Continue Lexapro 10 mg daily  Disposition: No evidence of imminent risk to self or others at present.   Patient does not meet criteria for psychiatric inpatient admission. Supportive therapy provided about ongoing stressors.  Waylan Boga, NP 10/14/2022 3:34  PM

## 2022-10-14 NOTE — Progress Notes (Signed)
Pt still continues to refuse Tele, but RN was able to complete the IV infusion of  K and Ph, 1 unit of blood is currently running. MD made-aware.

## 2022-10-14 NOTE — Progress Notes (Signed)
[facility] Progress Note  Patient: Edward Singleton    HER:[id number]  DOA: 10/11/2022     Date of Service: the patient was seen and examined on 10/14/2022  Chief Complaint  Patient presents with   Shortness of Breath   Brief hospital course:  HPI: Edward Singleton is a 72 y.o. 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  Patient presented from home on account of acute shortness of breath.  Symptoms consistent with acute COPD exacerbation.  Despite use of his rescue inhalers at home, symptoms did not improve and hence his admission.  He reports recent URI symptoms.  Denies any known sick contacts.  On presentation, patient was noted to be saturating 88% on room air.  Symptoms did improve on 2 L of oxygen.   Workup in the ED revealed patient had COVID infection.  Chest x-ray shows central lobar emphysema changes.  No acute cardiopulmonary process however noted   Assessment and Plan: Acute hypoxic respiratory insufficiency secondary to COPD exacerbation.   COPD exacerbation likely as result of COVID infection. COPD be optimized with bronchodilators and steroids.  No indication for empiric antibiotics at this time.  Respiratory failure resolved, patient is saturating well on room air. Leukocytosis most likely due to steroids.   Repeat CBC after 1 week as an outpatient and follow with PCP  COVID-19 infection: Patient is on Paxlovid D/c'd dexamethasone on 1/5  Elevated creatinine, baseline creatinine 1.7, presented with creatinine 2.06 on admission CKD stage IIIb Cr 1.43--1.22 improving Monitor creatinine level daily  Metabolic ketosis, CO2 level 16 Co 16--19 --22 --23 improved Started oral bicarbonate.  Monitor BMP daily  Hypokalemia, potassium repleted.   Monitor and replete as needed.  Hypophosphatemia, Phos  repleted.   Urinary retention, Foley catheter was inserted. Bladder scan showed 344-hour urine Started Flomax, recommended follow-up with urology in 1 week for further management as an outpatient Foley catheter was discontinued on 1/4, passed voiding trial   Hyponatremia could be secondary to EtOH abuse.  serum osmolality 287 wnl Monitor sodium level daily   Known history of alcohol abuse with multiple falls in the past and subsequent splenic injury resulting in splenectomy.  Recent humeral fracture with multiple surgeries and reinfections.   Long QTc syndrome: EKG on this admission shows QTc within limits.   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  Patient will continue with same.   Frequent falls at home: Patient denies any recent falls.  Sister who is his primary caretaker however concerned about right.   X-ray of the right humerus: Right shoulder arthroplasty with additional findings suspicious for lucency along the humeral component.   Recommended to follow with orthopedic surgery as an outpatient  HTN-resume home medications as appropriate.     Type 2 diabetes mellitus: On metformin at home . SSI for optimization of blood glucose   CAD/PAD- continue home regimen    Anemia of chronic disease, iron deficiency and vitamin B12 deficiency Transferrin saturation 7% Continue oral iron supplement with vitamin C  Vitamin B12 level 141, target >400, started vitamin B12 1000 mcg IM injection daily during hospital stay followed by oral supplement.  Need to follow with PCP to repeat iron profile and B12 level after 3 to 6 months. 1/3 Hb 6.3, dropped, transfuse 1 unit of PRBC Hb 7.8 posttransfusion 1/5 Hb 7.0, no active bleeding, transfuse  1 unit PRBC Patient denies any GI bleeding, started pantoprazole 40 mg p.o. daily as prophylactic Check FOBT   Thrombocytosis, most likely reactive, monitor platelet count daily  Hx of EtOH abuse  -denies any recent exposure.  CIWA protocol as needed   Anxiety and depression, 1/5 patient was unable to understand his condition and wanted to leave AMA.  Discussed with patient's sister who is legal POA recommended to keep him for the management until he needs to stay in the hospital. Consulted psych for comanagement Continue Lexapro,  Body mass index is 19.53 kg/m.  Interventions:       Diet: Carb modified DVT Prophylaxis: Subcutaneous Lovenox   Advance goals of care discussion: DNR  Family Communication: family was NOT present at bedside, at the time of interview.  The pt provided permission to discuss medical plan with the family. Opportunity was given to ask question and all questions were answered satisfactorily.  1/4 discussed with patient's sister over the phone  Disposition:  Pt is from Home, admitted with COVID viral infection, hypokalemia and low hemoglobin.  Patient will receive 1 unit of PRBC transfusion today, electrolytes repleted.  We will repeat labs tomorrow, FOBT pending, which precludes a safe discharge. Discharge to home, when stable, may need 1-2 more days.  Subjective: No significant events overnight, patient is feeling better, denies any worsening of shortness of breath, requesting to be discharged home.  Patient was ready to sign AMA but I discussed with patient's sister who is legal POA so patient cannot sign AMA, We will continue to manage as discussed above.   Physical Exam: General:  alert oriented to time, place, and person.  Appear in mild distress, affect anxious Eyes: PERRLA ENT: Oral Mucosa Clear, moist  Neck: no JVD,  Cardiovascular: S1 and S2 Present, no Murmur,  Respiratory: increased respiratory effort, Bilateral Air entry equal and Decreased, mild Crackles, no wheezes Abdomen: Bowel Sound present, Soft and no tenderness,  Skin: no rashes Extremities: no Pedal edema, no calf tenderness Neurologic: without any new focal findings Gait  not checked due to patient safety concerns  Vitals:   10/13/22 1619 10/13/22 2003 10/14/22 0612 10/14/22 0832  BP: (!) 116/54 129/68 132/65 132/66  Pulse: 70 74 71 71  Resp: '16 18 18 14  '$ Temp: 97.8 F (36.6 C) (!) 97.4 F (36.3 C) 97.8 F (36.6 C) 98 F (36.7 C)  TempSrc:  Oral    SpO2: 98% 97% 95% 92%  Weight:        Intake/Output Summary (Last 24 hours) at 10/14/2022 1602 Last data filed at 10/14/2022 1213 Gross per 24 hour  Intake 250 ml  Output 2025 ml  Net -1775 ml   Filed Weights   10/10/22 1904  Weight: 54.9 kg    Data Reviewed: I have personally reviewed and interpreted daily labs, tele strips, imagings as discussed above. I reviewed all nursing notes, pharmacy notes, vitals, pertinent old records I have discussed plan of care as described above with RN and patient/family.  CBC: Recent Labs  Lab 10/10/22 1915 10/12/22 0538 10/12/22 0950 10/12/22 2012 10/13/22 0656 10/14/22 0434  WBC 7.7 8.2  --   --  16.4* 19.5*  NEUTROABS 5.0 7.1  --   --  15.2* 18.0*  HGB 7.4* 5.7* 6.3* 7.8* 7.8* 7.0*  HCT 25.2* 19.1* 21.8* 24.5* 25.4* 22.5*  MCV 88.1 85.7  --   --  84.9 84.6  PLT 664* 600*  --   --  654* 599*   Basic  Metabolic Panel: Recent Labs  Lab 10/10/22 1915 10/12/22 0538 10/13/22 0656 10/14/22 0434  NA 129* 132* 137 133*  K 4.0 3.4* 3.6 3.0*  CL 98 103 106 104  CO2 16* 19* 22 23  GLUCOSE 90 130* 140* 127*  BUN 21 28* 28* 27*  CREATININE 2.07* 1.43* 1.38* 1.22  CALCIUM 9.1 8.2* 8.5* 7.9*  MG  --  2.2 2.2 2.1  PHOS  --  3.2 2.7 2.1*    Studies: No results found.  Scheduled Meds:  sodium chloride   Intravenous Once   allopurinol  150 mg Oral Daily   amLODipine  5 mg Oral Daily   vitamin C  500 mg Oral Daily   cefadroxil  500 mg Oral BID   Chlorhexidine Gluconate Cloth  6 each Topical Daily   cyanocobalamin  1,000 mcg Intramuscular Q1200   Followed by   Derrill Memo ON 10/18/2022] vitamin B-12  1,000 mcg Oral Daily   dexamethasone (DECADRON) injection   6 mg Intravenous Q24H   escitalopram  10 mg Oral Daily   folic acid  1 mg Oral Daily   heparin injection (subcutaneous)  5,000 Units Subcutaneous Q8H   insulin aspart  0-6 Units Subcutaneous TID WC   iron polysaccharides  150 mg Oral Daily   metoprolol succinate  25 mg Oral Daily   multivitamin with minerals  1 tablet Oral Daily   nirmatrelvir/ritonavir (renal dosing)  2 tablet Oral BID   pantoprazole  40 mg Oral Daily   polyethylene glycol  17 g Oral Daily   rosuvastatin  20 mg Oral Daily   sodium bicarbonate  650 mg Oral TID   tamsulosin  0.4 mg Oral QPC supper   thiamine  100 mg Oral Daily   Or   thiamine  100 mg Intravenous Daily   traZODone  50 mg Oral QHS   Continuous Infusions:  sodium chloride 75 mL/hr at 10/13/22 2223   potassium chloride 10 mEq (10/14/22 1558)   potassium PHOSPHATE IVPB (in mmol) 30 mmol (10/14/22 1207)   PRN Meds: acetaminophen, bisacodyl, bisacodyl, chlorpheniramine-HYDROcodone, gabapentin, guaiFENesin-dextromethorphan  Time spent: 50 minutes  Author: Val Riles. MD [facility] 10/14/2022 4:02 PM  To reach On-call, see care teams to locate the attending and reach out to them via [email or web address]. If 7PM-7AM, please contact night-coverage If you still have difficulty reaching the attending provider, please page the [facility] (Director on Call) for [facility] on amion for assistance.

## 2022-10-15 DIAGNOSIS — J441 Chronic obstructive pulmonary disease with (acute) exacerbation: Secondary | ICD-10-CM | POA: Diagnosis not present

## 2022-10-15 LAB — CBC WITH DIFFERENTIAL/PLATELET
Abs Immature Granulocytes: 0.22 10*3/uL — ABNORMAL HIGH (ref 0.00–0.07)
Basophils Absolute: 0 10*3/uL (ref 0.0–0.1)
Basophils Relative: 0 %
Eosinophils Absolute: 0.1 10*3/uL (ref 0.0–0.5)
Eosinophils Relative: 0 %
HCT: 29.4 % — ABNORMAL LOW (ref 39.0–52.0)
Hemoglobin: 9.4 g/dL — ABNORMAL LOW (ref 13.0–17.0)
Immature Granulocytes: 1 %
Lymphocytes Relative: 4 %
Lymphs Abs: 0.6 10*3/uL — ABNORMAL LOW (ref 0.7–4.0)
MCH: 26.9 pg (ref 26.0–34.0)
MCHC: 32 g/dL (ref 30.0–36.0)
MCV: 84 fL (ref 80.0–100.0)
Monocytes Absolute: 1 10*3/uL (ref 0.1–1.0)
Monocytes Relative: 6 %
Neutro Abs: 14.4 10*3/uL — ABNORMAL HIGH (ref 1.7–7.7)
Neutrophils Relative %: 89 %
Platelets: 603 10*3/uL — ABNORMAL HIGH (ref 150–400)
RBC: 3.5 MIL/uL — ABNORMAL LOW (ref 4.22–5.81)
RDW: 17.3 % — ABNORMAL HIGH (ref 11.5–15.5)
WBC: 16.2 10*3/uL — ABNORMAL HIGH (ref 4.0–10.5)
nRBC: 0.6 % — ABNORMAL HIGH (ref 0.0–0.2)

## 2022-10-15 LAB — TYPE AND SCREEN
ABO/RH(D): A POS
Antibody Screen: NEGATIVE
Unit division: 0
Unit division: 0

## 2022-10-15 LAB — COMPREHENSIVE METABOLIC PANEL
ALT: 13 U/L (ref 0–44)
AST: 16 U/L (ref 15–41)
Albumin: 3 g/dL — ABNORMAL LOW (ref 3.5–5.0)
Alkaline Phosphatase: 83 U/L (ref 38–126)
Anion gap: 8 (ref 5–15)
BUN: 25 mg/dL — ABNORMAL HIGH (ref 8–23)
CO2: 23 mmol/L (ref 22–32)
Calcium: 8.3 mg/dL — ABNORMAL LOW (ref 8.9–10.3)
Chloride: 104 mmol/L (ref 98–111)
Creatinine, Ser: 1.2 mg/dL (ref 0.61–1.24)
GFR, Estimated: >60 mL/min (ref >60)
Glucose, Bld: 125 mg/dL — ABNORMAL HIGH (ref 70–99)
Potassium: 3.8 mmol/L (ref 3.5–5.1)
Sodium: 135 mmol/L (ref 135–145)
Total Bilirubin: 0.7 mg/dL (ref 0.3–1.2)
Total Protein: 5.5 g/dL — ABNORMAL LOW (ref 6.5–8.1)

## 2022-10-15 LAB — BPAM RBC
Blood Product Expiration Date: 202401312359
Blood Product Expiration Date: 202401312359
ISSUE DATE / TIME: 202401031157
ISSUE DATE / TIME: 202401051712
Unit Type and Rh: 6200
Unit Type and Rh: 6200

## 2022-10-15 LAB — GLUCOSE, CAPILLARY
Glucose-Capillary: 103 mg/dL — ABNORMAL HIGH (ref 70–99)
Glucose-Capillary: 115 mg/dL — ABNORMAL HIGH (ref 70–99)

## 2022-10-15 LAB — PHOSPHORUS: Phosphorus: 3.2 mg/dL (ref 2.5–4.6)

## 2022-10-15 LAB — C-REACTIVE PROTEIN: CRP: 0.5 mg/dL (ref <1.0)

## 2022-10-15 LAB — MAGNESIUM: Magnesium: 1.9 mg/dL (ref 1.7–2.4)

## 2022-10-15 MED ORDER — PANTOPRAZOLE SODIUM 40 MG PO TBEC: 40.0000 mg | DELAYED_RELEASE_TABLET | Freq: Every day | ORAL | 2 refills | Status: DC

## 2022-10-15 NOTE — Progress Notes (Signed)
Patient has continued to refuse telemetry. Has also refused to use a top hat or provide needed stool specimen. Patient refused iv fluids at bedtime but was able to convince pt to continue IV infusion. Made aware of benefits & need,

## 2022-10-15 NOTE — Progress Notes (Signed)
Patient discharged home. Sister at the bedside to transport home. IV removed intact, gauze applied to site.

## 2022-10-15 NOTE — Discharge Summary (Signed)
[facility] Discharge Summary   Patient: Edward Singleton:[id number]  PCP: Windy Fast, MD  Date of admission: 10/11/2022   Date of discharge:  10/15/2022     Discharge Diagnoses:  Principal Problem:   COPD exacerbation ([place]) Active Problems:   Anxiety state   Admitted From: Home  Disposition:  Home   Recommendations for Outpatient Follow-up:  PCP: in 1 week, repeat CBC in 1 week to check Hb, repeat chest x-ray after 4 weeks, repeat vitamin B12 level and iron profile after 3 to 6 months. Follow-up with urology in 1 week for BPH, Foley catheter was removed, Flomax was started. Follow with orthopedics for shoulder infection as per schedule. F/u GI for anemia w/up as an out patient, refused for FOBT as inpatient Follow up LABS/TEST:  CBC in 1 wk, CXRin 4 wks    Diet recommendation: Heart healthy/carb modified  Activity: The patient is advised to gradually reintroduce usual activities, as tolerated  Discharge Condition: stable  Code Status: Full code   History of present illness: As per the H and P dictated on admission Hospital Course:  Edward Singleton is a 72 y.o. 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  Patient presented from home on account of acute shortness of breath.  Symptoms consistent with acute COPD exacerbation.  Despite use of his rescue inhalers at home, symptoms did not improve and hence his admission.  He reports recent URI symptoms.  Denies any known sick contacts.  On presentation, patient was noted to be saturating 88% on room air.  Symptoms did improve on 2 L of oxygen. Workup in the ED revealed patient had COVID infection.  Chest x-ray shows central lobar emphysema changes.  No acute cardiopulmonary process however noted  Assessment and Plan: # Acute hypoxic respiratory  insufficiency secondary to COPD exacerbation.   COPD exacerbation likely as result of COVID infection. COPD be optimized with bronchodilators and steroids.  No indication for empiric antibiotics at this time.  Respiratory failure resolved, patient is saturating well on room air. Leukocytosis most likely due to steroids.  Repeat CBC after 1 week as an outpatient and follow with PCP # COVID-19 infection: s/p Paxlovid completed 5-day course. D/c'd dexamethasone on 1/5 # Elevated creatinine, baseline creatinine 1.7, presented with creatinine 2.06 on admission CKD stage IIIb, Cr 1.43--1.20 improving, continue oral hydration. # Metabolic ketosis, CO2 level 16--23 resolved after oral bicarbonate given during hospital stay. # Hypokalemia, potassium repleted. Resolved # Hypophosphatemia, Phos repleted.  Resolved # Urinary retention, Foley catheter was inserted. Bladder scan showed 344-hour urine Started Flomax, Foley catheter was discontinued on 1/4, passed voiding trial.  Recommend to follow with urology as an outpatient for further management. # Hyponatremia could be secondary to EtOH abuse. serum osmolality 287 wnl. 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  Recent humeral fracture with multiple surgeries and reinfections.  Recommended to follow-up with orthopedics as an outpatient for left humeral infection. # Long QTc syndrome: EKG on this admission shows QTc within limits. # History of splenectomy: Patient is on chronic antibiotic therapy with cephalosporin.  Frequent falls at home: Patient denies any recent falls.  Sister who is his primary caretaker however concerned about right.  X-ray of the right humerus: Right shoulder arthroplasty with additional findings suspicious for lucency along  the humeral component.  Recommended to follow with orthopedic surgery as an outpatient # HTN-resume home medications as  appropriate. # Type 2 diabetes mellitus: On metformin at home, resumed on discharge, continue diabetic diet. # CAD/PAD- continue home regimen # Anemia of chronic disease, iron deficiency and vitamin B12 deficiency Transferrin saturation 7%, Continue oral iron supplement with vitamin C  Vitamin B12 level 141, target >400, started vitamin B12 1000 mcg IM injection daily during hospital stay followed by oral supplement.  Need to follow with PCP to repeat iron profile and B12 level after 3 to 6 months. 1/3 Hb 6.3, dropped, transfuse 1 unit of PRBC Hb 7.8 posttransfusion 1/5 Hb 7.0, no active bleeding, transfuse 1 unit PRBC Patient denies any GI bleeding, started pantoprazole 40 mg p.o. daily as prophylactic Check FOBT.  Patient refused for FOBT check as per RN.  But patient stated that he never refused for FOBT.  I do not know whom to believe. # Thrombocytosis, most likely reactive, monitor platelet count daily # Hx of EtOH abuse -denies any recent exposure.  CIWA protocol as needed # Anxiety and depression, on 1/5 patient was unable to understand his condition and wanted to leave AMA.  Discussed with patient's sister who is legal POA recommended to keep him for the management until he needs to stay in the hospital. Consulted psych for comanagement Continue Lexapro, and patient was started on gabapentin as needed for anxiety, during hospital stay but no prescription was given.  Patient was advised to follow-up with psych as an outpatient. Body mass index is 19.53 kg/m.  Interventions:    Patient was ambulatory without any assistance. On the day of the discharge the patient's vitals were stable, and no other acute medical condition were reported by patient. the patient was felt safe to be discharge at Home.  Consultants: Psych Procedures: None  Discharge Exam: General: Appear in no distress, no Rash; Oral Mucosa Clear, moist. Cardiovascular: S1 and S2 Present, no Murmur, Respiratory: normal  respiratory effort, Bilateral Air entry present and no Crackles, no wheezes Abdomen: Bowel Sound present, Soft and no tenderness, no hernia Extremities: no Pedal edema, no calf tenderness Neurology: alert and oriented to time, place, and person affect appropriate.  Filed Weights   10/10/22 1904  Weight: 54.9 kg   Vitals:   10/15/22 0442 10/15/22 0811  BP: 139/72 133/64  Pulse: 70 68  Resp: 20 20  Temp: 97.8 F (36.6 C) 98.8 F (37.1 C)  SpO2: 93% 97%    DISCHARGE MEDICATION: Allergies as of 10/15/2022   No Known Allergies      Medication List     STOP taking these medications    celecoxib 100 MG capsule Commonly known as: CELEBREX       TAKE these medications    allopurinol 300 MG tablet Commonly known as: ZYLOPRIM Take 150 mg by mouth daily.   amLODipine 5 MG tablet Commonly known as: NORVASC Take 5 mg by mouth daily.   cefadroxil 500 MG capsule Commonly known as: DURICEF Take 500 mg by mouth 2 (two) times daily.   cyanocobalamin 1000 MCG tablet Take 1 tablet (1,000 mcg total) by mouth daily. Start taking on: October 18, 2022   escitalopram 10 MG tablet Commonly known as: LEXAPRO Take 10 mg by mouth daily.   folic acid 1 MG tablet Commonly known as: FOLVITE Take 1 mg by mouth daily.   guaiFENesin-dextromethorphan 100-10 MG/5ML syrup Commonly known as: ROBITUSSIN DM Take 10 mLs by mouth every 4 (  four) hours as needed for cough.   iron polysaccharides 150 MG capsule Commonly known as: NIFEREX Take 1 capsule (150 mg total) by mouth daily.   metFORMIN 500 MG tablet Commonly known as: GLUCOPHAGE Take 500 mg by mouth 2 (two) times daily.   metoprolol succinate 50 MG 24 hr tablet Commonly known as: TOPROL-XL Take 0.5 tablets by mouth daily.   pantoprazole 40 MG tablet Commonly known as: Protonix Take 1 tablet (40 mg total) by mouth daily.   rosuvastatin 40 MG tablet Commonly known as: CRESTOR Take 20 mg by mouth daily.   tamsulosin 0.4 MG  Caps capsule Commonly known as: FLOMAX Take 1 capsule (0.4 mg total) by mouth daily after supper.   thiamine 100 MG tablet Commonly known as: Vitamin B-1 Take 100 mg by mouth daily.   traZODone 50 MG tablet Commonly known as: DESYREL Take 1 tablet by mouth at bedtime.               Durable Medical Equipment  (From admission, onward)           Start     Ordered   10/13/22 1324  For home use only DME oxygen  Once       Question Answer Comment  Length of Need 6 Months   Mode or (Route) Nasal cannula   Liters per Minute 2   Frequency Continuous (stationary and portable oxygen unit needed)   Oxygen delivery system Gas      10/13/22 1323           No Known Allergies Discharge Instructions     Ambulatory Referral for Lung Cancer Scre   Complete by: As directed    Call MD for:  difficulty breathing, headache or visual disturbances   Complete by: As directed    Call MD for:  temperature >100.4   Complete by: As directed    Diet - low sodium heart healthy   Complete by: As directed    Discharge instructions   Complete by: As directed    Follow-up with PCP in 1 week, repeat CBC in 1 week to check Hb, repeat chest x-ray after 4 weeks, repeat vitamin B12 level and iron profile after 3 to 6 months. Follow-up with urology in 1 week for BPH, Foley catheter was removed, Flomax was started. Follow with orthopedics for shoulder infection as per schedule. F/u GI for anemia w/up as an out patient, refused for FOBT as inpatient.   Increase activity slowly   Complete by: As directed        The results of significant diagnostics from this hospitalization (including imaging, microbiology, ancillary and laboratory) are listed below for reference.    Significant Diagnostic Studies: DG Humerus Right  Result Date: 10/11/2022 CLINICAL DATA:  Atraumatic right shoulder pain. EXAM: RIGHT HUMERUS - 2+ VIEW COMPARISON:  May 11, 2022 FINDINGS: Right shoulder arthroplasty is seen.  A thin, circumferential lucency is seen surrounding the humeral component within the proximal to mid humeral shaft. This represents a new finding when compared to the prior study. Multiple chronic right-sided rib fractures are noted. Soft tissues are unremarkable. IMPRESSION: Right shoulder arthroplasty with additional findings suspicious for lucency along the humeral component, as described above. Electronically Signed   By: Virgina Norfolk M.D.   On: 10/11/2022 02:48   DG Chest 2 View  Result Date: 10/10/2022 CLINICAL DATA:  Shortness of breath. EXAM: CHEST - 2 VIEW COMPARISON:  AP chest 04/22/2022, 10/26/2021 FINDINGS: Cardiac silhouette and mediastinal contours are  within normal limits. There are cystic centrilobular emphysematous changes predominantly within the upper lungs, similar to prior. No acute airspace opacity. Flattening of the diaphragms and moderate hyperinflation. No pleural effusion or pneumothorax. Moderate multilevel degenerative disc changes of the thoracic spine. Old healed posterior left eighth rib fracture. Status post right shoulder arthroplasty. IMPRESSION: 1. No acute cardiopulmonary process. 2. Centrilobular emphysematous changes, similar to prior. Electronically Signed   By: Yvonne Kendall M.D.   On: 10/10/2022 20:02    Microbiology: Recent Results (from the past 240 hour(s))  Resp panel by RT-PCR (RSV, Flu A&B, Covid) Anterior Nasal Swab     Status: Abnormal   Collection Time: 10/10/22  7:15 PM   Specimen: Anterior Nasal Swab  Result Value Ref Range Status   SARS Coronavirus 2 by RT PCR POSITIVE (A) NEGATIVE Final    Comment: (NOTE) SARS-CoV-2 target nucleic acids are DETECTED.  The SARS-CoV-2 RNA is generally detectable in upper respiratory specimens during the acute phase of infection. Positive results are indicative of the presence of the identified virus, but do not rule out bacterial infection or co-infection with other pathogens not detected by the test.  Clinical correlation with patient history and other diagnostic information is necessary to determine patient infection status. The expected result is Negative.  Fact Sheet for Patients: EntrepreneurPulse.com.au  Fact Sheet for Healthcare Providers: IncredibleEmployment.be  This test is not yet approved or cleared by the [place] FDA and  has been authorized for detection and/or diagnosis of SARS-CoV-2 by FDA under an Emergency Use Authorization (EUA).  This EUA will remain in effect (meaning this test can be used) for the duration of  the COVID-19 declaration under Section 564(b)(1) of the A ct, 21 U.S.C. section 360bbb-3(b)(1), unless the authorization is terminated or revoked sooner.     Influenza A by PCR NEGATIVE NEGATIVE Final   Influenza B by PCR NEGATIVE NEGATIVE Final    Comment: (NOTE) The Xpert Xpress SARS-CoV-2/FLU/RSV plus assay is intended as an aid in the diagnosis of influenza from Nasopharyngeal swab specimens and should not be used as a sole basis for treatment. Nasal washings and aspirates are unacceptable for Xpert Xpress SARS-CoV-2/FLU/RSV testing.  Fact Sheet for Patients: EntrepreneurPulse.com.au  Fact Sheet for Healthcare Providers: IncredibleEmployment.be  This test is not yet approved or cleared by the [place] FDA and has been authorized for detection and/or diagnosis of SARS-CoV-2 by FDA under an Emergency Use Authorization (EUA). This EUA will remain in effect (meaning this test can be used) for the duration of the COVID-19 declaration under Section 564(b)(1) of the Act, 21 U.S.C. section 360bbb-3(b)(1), unless the authorization is terminated or revoked.     Resp Syncytial Virus by PCR NEGATIVE NEGATIVE Final    Comment: (NOTE) Fact Sheet for Patients: EntrepreneurPulse.com.au  Fact Sheet for Healthcare  Providers: IncredibleEmployment.be  This test is not yet approved or cleared by the [place] FDA and has been authorized for detection and/or diagnosis of SARS-CoV-2 by FDA under an Emergency Use Authorization (EUA). This EUA will remain in effect (meaning this test can be used) for the duration of the COVID-19 declaration under Section 564(b)(1) of the Act, 21 U.S.C. section 360bbb-3(b)(1), unless the authorization is terminated or revoked.  Performed at [facility], [address] [place], [place] [address]   Culture, blood (Routine X 2) w Reflex to ID Panel     Status: None (Preliminary result)   Collection Time: 10/11/22  2:40 AM   Specimen: BLOOD RIGHT ARM  Result Value Ref Range Status   Specimen Description BLOOD RIGHT ARM  Final   Special Requests   Final    BOTTLES DRAWN AEROBIC AND ANAEROBIC Blood Culture adequate volume   Culture   Final    NO GROWTH 4 DAYS Performed at [facility], [address], [place], [place] [address]    Report Status PENDING  Incomplete  Culture, blood (Routine X 2) w Reflex to ID Panel     Status: None (Preliminary result)   Collection Time: 10/11/22  3:15 AM   Specimen: BLOOD LEFT ARM  Result Value Ref Range Status   Specimen Description BLOOD LEFT ARM  Final   Special Requests   Final    BOTTLES DRAWN AEROBIC AND ANAEROBIC Blood Culture results may not be optimal due to an inadequate volume of blood received in culture bottles   Culture   Final    NO GROWTH 4 DAYS Performed at [facility], [address] [place], [place] [address]    Report Status PENDING  Incomplete  Respiratory (~20 pathogens) panel by PCR     Status: None   Collection Time: 10/11/22  5:50 AM  Result Value Ref Range Status   Adenovirus NOT DETECTED NOT DETECTED Final   Coronavirus 229E NOT DETECTED NOT DETECTED Final    Comment: (NOTE) The Coronavirus on the Respiratory Panel, DOES NOT test for the novel   Coronavirus (2019 nCoV)    Coronavirus HKU1 NOT DETECTED NOT DETECTED Final   Coronavirus NL63 NOT DETECTED NOT DETECTED Final   Coronavirus OC43 NOT DETECTED NOT DETECTED Final   Metapneumovirus NOT DETECTED NOT DETECTED Final   Rhinovirus / Enterovirus NOT DETECTED NOT DETECTED Final   Influenza A NOT DETECTED NOT DETECTED Final   Influenza B NOT DETECTED NOT DETECTED Final   Parainfluenza Virus 1 NOT DETECTED NOT DETECTED Final   Parainfluenza Virus 2 NOT DETECTED NOT DETECTED Final   Parainfluenza Virus 3 NOT DETECTED NOT DETECTED Final   Parainfluenza Virus 4 NOT DETECTED NOT DETECTED Final   Respiratory Syncytial Virus NOT DETECTED NOT DETECTED Final   Bordetella pertussis NOT DETECTED NOT DETECTED Final   Bordetella Parapertussis NOT DETECTED NOT DETECTED Final   Chlamydophila pneumoniae NOT DETECTED NOT DETECTED Final   Mycoplasma pneumoniae NOT DETECTED NOT DETECTED Final    Comment: Performed at [facility], [place] [address], [place], [place] [address]     Labs: CBC: Recent Labs  Lab 10/10/22 1915 10/12/22 8315 10/12/22 0950 10/12/22 2012 10/13/22 0656 10/14/22 0434 10/15/22 0437  WBC 7.7 8.2  --   --  16.4* 19.5* 16.2*  NEUTROABS 5.0 7.1  --   --  15.2* 18.0* 14.4*  HGB 7.4* 5.7* 6.3* 7.8* 7.8* 7.0* 9.4*  HCT 25.2* 19.1* 21.8* 24.5* 25.4* 22.5* 29.4*  MCV 88.1 85.7  --   --  84.9 84.6 84.0  PLT 664* 600*  --   --  654* 599* 176*   Basic Metabolic Panel: Recent Labs  Lab 10/10/22 1915 10/12/22 0538 10/13/22 0656 10/14/22 0434 10/15/22 0437  NA 129* 132* 137 133* 135  K 4.0 3.4* 3.6 3.0* 3.8  CL 98 103 106 104 104  CO2 16* 19* '22 23 23  '$ GLUCOSE 90 130* 140* 127* 125*  BUN 21 28* 28* 27* 25*  CREATININE 2.07* 1.43* 1.38* 1.22 1.20  CALCIUM 9.1 8.2* 8.5* 7.9* 8.3*  MG  --  2.2 2.2 2.1 1.9  PHOS  --  3.2 2.7 2.1* 3.2  Liver Function Tests: Recent Labs  Lab 10/10/22 1915 10/12/22 0538 10/13/22 0656 10/14/22 0434 10/15/22 0437  AST '21 20  15 16 16  '$ ALT '11 12 10 11 13  '$ ALKPHOS 104 77 91 81 83  BILITOT 0.6 0.7 1.0 0.7 0.7  PROT 7.3 6.0* 6.0* 5.4* 5.5*  ALBUMIN 4.1 3.2* 3.3* 3.0* 3.0*   No results for input(s): "LIPASE", "AMYLASE" in the last 168 hours. No results for input(s): "AMMONIA" in the last 168 hours. Cardiac Enzymes: No results for input(s): "CKTOTAL", "CKMB", "CKMBINDEX", "TROPONINI" in the last 168 hours. BNP (last 3 results) Recent Labs    10/11/22 0350  BNP 43.4   CBG: Recent Labs  Lab 10/14/22 1209 10/14/22 1639 10/14/22 2053 10/15/22 0809 10/15/22 1136  GLUCAP 137* 166* 160* 103* 115*    Time spent: 35 minutes  Signed:  Val Riles  [facility] 10/15/2022 11:49 AM

## 2022-10-15 NOTE — Progress Notes (Signed)
SATURATION QUALIFICATIONS: (This note is used to comply with regulatory documentation for home oxygen)  Patient Saturations on Room Air at Rest = 93%  Patient Saturations on Room Air while Ambulating = 90%  Patient Saturations on 2L Liters of oxygen while Ambulating = 93%  Please briefly explain why patient needs home oxygen: patient feels he needs O2 at home supplemental. The patient desaturated with ambulation and is SOB when ambulating, unable to walk long distance without sitting down to catch his breath.

## 2022-10-15 NOTE — Progress Notes (Addendum)
Notified by RN that patient requesting home o2 but does not have qualifying sats. Called Jasmine with [employer] who stated they can provide home o2 - patient would have to pay around $250 a month for it.  Notified RN. Will order if patient would like.  1:39- Per MD, patient not safe for home o2 and no need for home o2. Per MD, sister states patient smokes in the home and has Dementia which would be unsafe for home o2.   Oleh Genin, [place]

## 2022-10-16 LAB — CULTURE, BLOOD (ROUTINE X 2)
Culture: NO GROWTH
Culture: NO GROWTH
Special Requests: ADEQUATE

## 2023-08-25 ENCOUNTER — Encounter: Payer: Self-pay | Admitting: Emergency Medicine

## 2023-08-25 ENCOUNTER — Observation Stay
Admission: EM | Admit: 2023-08-25 | Discharge: 2023-08-27 | Disposition: A | Discharge status: Home / Self Care | Payer: No Typology Code available for payment source | Attending: Internal Medicine | Admitting: Internal Medicine

## 2023-08-25 ENCOUNTER — Other Ambulatory Visit: Payer: Self-pay

## 2023-08-25 DIAGNOSIS — J449 Chronic obstructive pulmonary disease, unspecified: Secondary | ICD-10-CM | POA: Diagnosis present

## 2023-08-25 DIAGNOSIS — E785 Hyperlipidemia, unspecified: Secondary | ICD-10-CM | POA: Diagnosis present

## 2023-08-25 DIAGNOSIS — Z720 Tobacco use: Secondary | ICD-10-CM | POA: Diagnosis present

## 2023-08-25 DIAGNOSIS — I129 Hypertensive chronic kidney disease with stage 1 through stage 4 chronic kidney disease, or unspecified chronic kidney disease: Secondary | ICD-10-CM | POA: Diagnosis not present

## 2023-08-25 DIAGNOSIS — D649 Anemia, unspecified: Principal | ICD-10-CM | POA: Diagnosis present

## 2023-08-25 DIAGNOSIS — D509 Iron deficiency anemia, unspecified: Principal | ICD-10-CM | POA: Diagnosis present

## 2023-08-25 DIAGNOSIS — R197 Diarrhea, unspecified: Secondary | ICD-10-CM | POA: Diagnosis not present

## 2023-08-25 DIAGNOSIS — I2510 Atherosclerotic heart disease of native coronary artery without angina pectoris: Secondary | ICD-10-CM | POA: Diagnosis not present

## 2023-08-25 DIAGNOSIS — N179 Acute kidney failure, unspecified: Secondary | ICD-10-CM | POA: Diagnosis present

## 2023-08-25 DIAGNOSIS — E1129 Type 2 diabetes mellitus with other diabetic kidney complication: Secondary | ICD-10-CM | POA: Diagnosis present

## 2023-08-25 DIAGNOSIS — F17210 Nicotine dependence, cigarettes, uncomplicated: Secondary | ICD-10-CM | POA: Diagnosis not present

## 2023-08-25 DIAGNOSIS — I10 Essential (primary) hypertension: Secondary | ICD-10-CM | POA: Diagnosis not present

## 2023-08-25 DIAGNOSIS — E1122 Type 2 diabetes mellitus with diabetic chronic kidney disease: Secondary | ICD-10-CM | POA: Insufficient documentation

## 2023-08-25 DIAGNOSIS — M009 Pyogenic arthritis, unspecified: Secondary | ICD-10-CM | POA: Diagnosis present

## 2023-08-25 DIAGNOSIS — J42 Unspecified chronic bronchitis: Secondary | ICD-10-CM

## 2023-08-25 DIAGNOSIS — Z7982 Long term (current) use of aspirin: Secondary | ICD-10-CM | POA: Insufficient documentation

## 2023-08-25 DIAGNOSIS — Z79899 Other long term (current) drug therapy: Secondary | ICD-10-CM | POA: Insufficient documentation

## 2023-08-25 DIAGNOSIS — F0390 Unspecified dementia without behavioral disturbance: Secondary | ICD-10-CM | POA: Diagnosis not present

## 2023-08-25 DIAGNOSIS — F1010 Alcohol abuse, uncomplicated: Secondary | ICD-10-CM | POA: Diagnosis present

## 2023-08-25 DIAGNOSIS — N1832 Chronic kidney disease, stage 3b: Secondary | ICD-10-CM

## 2023-08-25 DIAGNOSIS — Z96611 Presence of right artificial shoulder joint: Secondary | ICD-10-CM | POA: Insufficient documentation

## 2023-08-25 DIAGNOSIS — F418 Other specified anxiety disorders: Secondary | ICD-10-CM | POA: Diagnosis present

## 2023-08-25 DIAGNOSIS — M109 Gout, unspecified: Secondary | ICD-10-CM | POA: Diagnosis present

## 2023-08-25 DIAGNOSIS — Z7984 Long term (current) use of oral hypoglycemic drugs: Secondary | ICD-10-CM | POA: Insufficient documentation

## 2023-08-25 LAB — COMPREHENSIVE METABOLIC PANEL
ALT: 13 U/L (ref 0–44)
AST: 22 U/L (ref 15–41)
Albumin: 3.8 g/dL (ref 3.5–5.0)
Alkaline Phosphatase: 79 U/L (ref 38–126)
Anion gap: 12 (ref 5–15)
BUN: 15 mg/dL (ref 8–23)
CO2: 20 mmol/L — ABNORMAL LOW (ref 22–32)
Calcium: 9.2 mg/dL (ref 8.9–10.3)
Chloride: 99 mmol/L (ref 98–111)
Creatinine, Ser: 1.71 mg/dL — ABNORMAL HIGH (ref 0.61–1.24)
GFR, Estimated: 42 mL/min — ABNORMAL LOW (ref >60)
Glucose, Bld: 98 mg/dL (ref 70–99)
Potassium: 4.8 mmol/L (ref 3.5–5.1)
Sodium: 131 mmol/L — ABNORMAL LOW (ref 135–145)
Total Bilirubin: 0.5 mg/dL (ref <1.2)
Total Protein: 6.5 g/dL (ref 6.5–8.1)

## 2023-08-25 LAB — CBC
HCT: 19.1 % — ABNORMAL LOW (ref 39.0–52.0)
HCT: 22.9 % — ABNORMAL LOW (ref 39.0–52.0)
Hemoglobin: 6 g/dL — ABNORMAL LOW (ref 13.0–17.0)
Hemoglobin: 7.2 g/dL — ABNORMAL LOW (ref 13.0–17.0)
MCH: 28.7 pg (ref 26.0–34.0)
MCH: 28.6 pg (ref 26.0–34.0)
MCHC: 31.4 g/dL (ref 30.0–36.0)
MCHC: 31.4 g/dL (ref 30.0–36.0)
MCV: 91.4 fL (ref 80.0–100.0)
MCV: 90.9 fL (ref 80.0–100.0)
Platelets: 422 10*3/uL — ABNORMAL HIGH (ref 150–400)
Platelets: 390 10*3/uL (ref 150–400)
RBC: 2.09 MIL/uL — ABNORMAL LOW (ref 4.22–5.81)
RBC: 2.52 MIL/uL — ABNORMAL LOW (ref 4.22–5.81)
RDW: 19.4 % — ABNORMAL HIGH (ref 11.5–15.5)
RDW: 18.1 % — ABNORMAL HIGH (ref 11.5–15.5)
WBC: 9 10*3/uL (ref 4.0–10.5)
WBC: 10 10*3/uL (ref 4.0–10.5)
nRBC: 1.1 % — ABNORMAL HIGH (ref 0.0–0.2)
nRBC: 0.6 % — ABNORMAL HIGH (ref 0.0–0.2)

## 2023-08-25 LAB — PROTIME-INR
INR: 1 (ref 0.8–1.2)
Prothrombin Time: 13.8 s (ref 11.4–15.2)

## 2023-08-25 LAB — GLUCOSE, CAPILLARY: Glucose-Capillary: 100 mg/dL — ABNORMAL HIGH (ref 70–99)

## 2023-08-25 LAB — RETICULOCYTES
Immature Retic Fract: 35.1 % — ABNORMAL HIGH (ref 2.3–15.9)
RBC.: 2.08 MIL/uL — ABNORMAL LOW (ref 4.22–5.81)
Retic Count, Absolute: 74.5 10*3/uL (ref 19.0–186.0)
Retic Ct Pct: 3.6 % — ABNORMAL HIGH (ref 0.4–3.1)

## 2023-08-25 LAB — APTT: aPTT: 24 s (ref 24–36)

## 2023-08-25 MED ORDER — FOLIC ACID 1 MG PO TABS
1.0000 mg | ORAL_TABLET | Freq: Every day | ORAL | Status: DC
  Administered 2023-08-25 – 2023-08-27 (×3): 1 mg via ORAL
  Filled 2023-08-25 (×3): qty 1

## 2023-08-25 MED ORDER — PANTOPRAZOLE SODIUM 40 MG IV SOLR
40.0000 mg | Freq: Two times a day (BID) | INTRAVENOUS | Status: DC
  Administered 2023-08-25 – 2023-08-27 (×4): 40 mg via INTRAVENOUS
  Filled 2023-08-25 (×4): qty 10

## 2023-08-25 MED ORDER — ALBUTEROL SULFATE (2.5 MG/3ML) 0.083% IN NEBU: 2.5000 mg | INHALATION_SOLUTION | RESPIRATORY_TRACT | Status: DC | PRN

## 2023-08-25 MED ORDER — SODIUM CHLORIDE 0.9 % IV SOLN: INTRAVENOUS | Status: AC

## 2023-08-25 MED ORDER — ONDANSETRON HCL 4 MG/2ML IJ SOLN
4.0000 mg | Freq: Three times a day (TID) | INTRAMUSCULAR | Status: DC | PRN
  Administered 2023-08-25: 4 mg via INTRAVENOUS
  Filled 2023-08-25: qty 2

## 2023-08-25 MED ORDER — SODIUM CHLORIDE 0.9 % IV SOLN
10.0000 mL/h | Freq: Once | INTRAVENOUS | Status: AC
  Administered 2023-08-25: 10 mL/h via INTRAVENOUS

## 2023-08-25 MED ORDER — LORAZEPAM 1 MG PO TABS: 1.0000 mg | ORAL_TABLET | ORAL | Status: DC | PRN

## 2023-08-25 MED ORDER — LORAZEPAM 2 MG/ML IJ SOLN: 0.0000 mg | Freq: Two times a day (BID) | INTRAMUSCULAR | Status: DC

## 2023-08-25 MED ORDER — THIAMINE MONONITRATE 100 MG PO TABS
100.0000 mg | ORAL_TABLET | Freq: Every day | ORAL | Status: DC
  Administered 2023-08-26 – 2023-08-27 (×2): 100 mg via ORAL
  Filled 2023-08-25 (×3): qty 1

## 2023-08-25 MED ORDER — ADULT MULTIVITAMIN W/MINERALS CH
1.0000 | ORAL_TABLET | Freq: Every day | ORAL | Status: DC
  Administered 2023-08-25 – 2023-08-27 (×3): 1 via ORAL
  Filled 2023-08-25 (×3): qty 1

## 2023-08-25 MED ORDER — LORAZEPAM 2 MG/ML IJ SOLN
0.0000 mg | Freq: Four times a day (QID) | INTRAMUSCULAR | Status: DC
  Administered 2023-08-25: 2 mg via INTRAVENOUS
  Filled 2023-08-25: qty 1

## 2023-08-25 MED ORDER — INSULIN ASPART 100 UNIT/ML IJ SOLN: 0.0000 [IU] | Freq: Every day | INTRAMUSCULAR | Status: DC

## 2023-08-25 MED ORDER — NICOTINE 21 MG/24HR TD PT24
21.0000 mg | MEDICATED_PATCH | Freq: Every day | TRANSDERMAL | Status: DC
  Administered 2023-08-25 – 2023-08-27 (×3): 21 mg via TRANSDERMAL
  Filled 2023-08-25 (×3): qty 1

## 2023-08-25 MED ORDER — INSULIN ASPART 100 UNIT/ML IJ SOLN: 0.0000 [IU] | Freq: Three times a day (TID) | INTRAMUSCULAR | Status: DC

## 2023-08-25 MED ORDER — ACETAMINOPHEN 325 MG PO TABS: 650.0000 mg | ORAL_TABLET | Freq: Four times a day (QID) | ORAL | Status: DC | PRN

## 2023-08-25 MED ORDER — HYDRALAZINE HCL 20 MG/ML IJ SOLN: 5.0000 mg | INTRAMUSCULAR | Status: DC | PRN

## 2023-08-25 MED ORDER — THIAMINE HCL 100 MG/ML IJ SOLN
100.0000 mg | Freq: Every day | INTRAMUSCULAR | Status: DC
Start: 2023-08-25 — End: 2023-08-27
  Administered 2023-08-25: 100 mg via INTRAVENOUS
  Filled 2023-08-25: qty 2

## 2023-08-25 MED ORDER — LORAZEPAM 2 MG/ML IJ SOLN: 1.0000 mg | INTRAMUSCULAR | Status: DC | PRN

## 2023-08-25 MED ORDER — DM-GUAIFENESIN ER 30-600 MG PO TB12: 1.0000 | ORAL_TABLET | Freq: Two times a day (BID) | ORAL | Status: DC | PRN

## 2023-08-25 NOTE — ED Notes (Signed)
Lab called to obtain labs after RN and CNA unsuccessful

## 2023-08-25 NOTE — ED Notes (Signed)
Pt has emesis x2 vomit appears liquid with small mucous brown particles pt gown changed bed changed pt noted to desat post admin ativan pt placed on 2 L o2 via Power pt sat increase to 96%

## 2023-08-25 NOTE — ED Notes (Signed)
Family reports pt home medications from triage list changes include Duricef 250mg  BID Metformin 500mg  bid and B12 daily   Remove  - Robitussin , Iron, Protonix from home medications pharm tech to remove   Both [address] and [address] floor notified of pt bed assignement change unknown to Presenter, broadcasting until contacting floor [address] to provide update on pt current status

## 2023-08-25 NOTE — ED Provider Notes (Signed)
[facility] Provider Note    Event Date/Time   First MD Initiated Contact with Patient 08/25/23 1824     (approximate)   History   Abnormal Labs   HPI  Edward Singleton is a 72 y.o. male with a history of CAD, PAD, diabetes, COPD, alcohol abuse, splenectomy, and anemia of chronic disease who presents with a low hemoglobin.  The patient states that he was at the [place] today for a PMD and infectious disease appointment.  His primary care doctor noted he appeared quite pale and ordered stat labs which show that he was significantly anemic.  He was told to come to the ER for transfusion and admission.  The patient states that he has not had any dizziness or weakness, but does feel more short of breath than normal even with minimal exertion such as going from one room to another or in and out of a vehicle.  This is new over the last few weeks.  The patient also reports diarrhea over about the last month and intermittent vomiting for about the same time period.  The vomiting is worse after he first lies down.  He denies any blood in the vomitus or stool.  RV the past medical records.  The patient was most recently admitted to the hospital service in January due to COPD exacerbation and respiratory failure secondary to COVID.  He was noted to have anemia of chronic disease and was transfused PRBCs during that admission.   Physical Exam   Triage Vital Signs: ED Triage Vitals [08/25/23 1724]  Encounter Vitals Group     BP (!) 101/50     Systolic BP Percentile      Diastolic BP Percentile      Pulse Rate 67     Resp 18     Temp 98 F (36.7 C)     Temp Source Oral     SpO2 97 %     Weight      Height      Head Circumference      Peak Flow      Pain Score 0     Pain Loc      Pain Education      Exclude from Growth Chart     Most recent vital signs: Vitals:   08/25/23 2000 08/25/23 2006  BP: 128/68 128/68  Pulse: 74 75  Resp: 15 15  Temp:  98 F (36.7 C)   SpO2: 96%      General: Awake, no distress.  CV:  Good peripheral perfusion.  Resp:  Normal effort.  Lungs CTAB. Abd:  Soft and nontender.  No distention.  Other:  Pale appearing.  Conjunctival pallor.   ED Results / Procedures / Treatments   Labs (all labs ordered are listed, but only abnormal results are displayed) Labs Reviewed  COMPREHENSIVE METABOLIC PANEL - Abnormal; Notable for the following components:      Result Value   Sodium 131 (*)    CO2 20 (*)    Creatinine, Ser 1.71 (*)    GFR, Estimated 42 (*)    All other components within normal limits  CBC - Abnormal; Notable for the following components:   RBC 2.09 (*)    Hemoglobin 6.0 (*)    HCT 19.1 (*)    RDW 19.4 (*)    Platelets 422 (*)    nRBC 1.1 (*)    All other components within normal limits  PROTIME-INR  APTT  CBC  CBC  CBC  BASIC METABOLIC PANEL  POC OCCULT BLOOD, ED  TYPE AND SCREEN  PREPARE RBC (CROSSMATCH)     EKG     RADIOLOGY     PROCEDURES:  Critical Care performed: No  Procedures   MEDICATIONS ORDERED IN ED: Medications  0.9 %  sodium chloride infusion (has no administration in time range)  0.9 %  sodium chloride infusion ( Intravenous New Bag/Given 08/25/23 2011)  pantoprazole (PROTONIX) injection 40 mg (has no administration in time range)  nicotine (NICODERM CQ - dosed in mg/24 hours) patch 21 mg (has no administration in time range)  albuterol (PROVENTIL) (2.5 MG/3ML) 0.083% nebulizer solution 2.5 mg (has no administration in time range)  dextromethorphan-guaiFENesin (MUCINEX DM) 30-600 MG per 12 hr tablet 1 tablet (has no administration in time range)  ondansetron (ZOFRAN) injection 4 mg (has no administration in time range)  hydrALAZINE (APRESOLINE) injection 5 mg (has no administration in time range)  acetaminophen (TYLENOL) tablet 650 mg (has no administration in time range)  insulin aspart (novoLOG) injection 0-9 Units (has no administration in time range)   insulin aspart (novoLOG) injection 0-5 Units (has no administration in time range)     IMPRESSION / MDM / ASSESSMENT AND PLAN / ED COURSE  I reviewed the triage vital signs and the nursing notes.  72 year old male with PMH as noted above including anemia of chronic disease requiring transfusions previously presents with a low hemoglobin on outpatient labs today as well as increased shortness of breath with minimal exertion.  He also has had some diarrhea and vomiting for the last several weeks although nonbloody.  Exam is significant for pallor.  Vital signs are normal.  Lab workup here confirms hemoglobin of 6 with normal MCV.  CMP shows creatinine slightly elevated from baseline.  Differential diagnosis includes, but is not limited to, anemia of chronic disease, less likely iron deficiency or other anemia.  We will order a PRBC transfusion and admit to the hospitalist service for further management.  Patient's presentation is most consistent with acute presentation with potential threat to life or bodily function.  The patient is on the cardiac monitor to evaluate for evidence of arrhythmia and/or significant heart rate changes.  ----------------------------------------- 8:17 PM on 08/25/2023 -----------------------------------------  I consulted Dr. Clyde Lundborg from the hospitalist service; based on our discussion he agrees to evaluate the patient for admission.  FINAL CLINICAL IMPRESSION(S) / ED DIAGNOSES   Final diagnoses:  Symptomatic anemia     Rx / DC Orders   ED Discharge Orders     None        Note:  This document was prepared using Dragon voice recognition software and may include unintentional dictation errors.    Dionne Bucy, MD 08/25/23 2017

## 2023-08-25 NOTE — ED Notes (Signed)
Hard copy completion rbc form complete

## 2023-08-25 NOTE — ED Triage Notes (Signed)
Pt presents after being at the [place] today.  Hemoglobin was reported to be 6.  Pt is very pale.  Has not seen any dark or bloody stools but has had diarrhea and vomiting.

## 2023-08-25 NOTE — H&P (Signed)
History and Physical    Edward Singleton:[id number] DOB: [birth year] DOA: 08/25/2023  Referring MD/NP/PA:   PCP: [name], MD   Patient coming from:  The patient is coming from home.     Chief Complaint: Abnormal lab with low hemoglobin   HPI: Edward Singleton is a 72 y.o. 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   Per his sister at bedside, pt was at the [place] today for infectious disease appointment for his chronic right shoulder septic joint issue. His primary care doctor noted pt appeared quite pale and ordered stat labs which show that he was significantly anemic.  He was told to come to the ER for transfusion and admission.  Pt reports having dark stool sometimes, but he is not sure if that is blood.  Patient is takes aspirin 81 mg.  Patient also reports nausea, vomiting, diarrhea in the past several days.  He has multiple episodes of nonbilious nonbloody vomiting and watery diarrhea each day.  No abdominal pain.  No fever or chills.  Patient has mild shortness of breath on exertion, no cough, chest pain.  No symptoms of UTI.  Denies dizziness or lightheadedness.  Data reviewed independently and ED Course: pt was found to have hemoglobin 9.4 on 10/15/2022 -->6.0, WBC 9.0, worsening renal function with creatinine 1.71, BUN 15, GFR 42 (recent baseline creatinine 1.20 on 10/15/2022), temperature normal, blood pressure 121/66, heart rate 67, RR 16, oxygen saturation 96% on room air.  Patient is placed on MedSurg bed for observation.  Dr. Norma Fredrickson for GI is consulted.  EKG: Not done in ED, will get one.      Review of Systems:   General: no fevers, chills, no body weight gain, fatigue HEENT: no blurry vision, hearing changes or sore  throat Respiratory: no dyspnea, coughing, wheezing CV: no chest pain, no palpitations GI: has nausea, vomiting, diarrhea, no constipation, abdominal pain,  GU: no dysuria, burning on urination, increased urinary frequency, hematuria  Ext: no leg edema Neuro: no unilateral weakness, numbness, or tingling, no vision change or hearing loss Skin: no rash, no skin tear. MSK: No muscle spasm, no deformity, no limitation of range of movement in spin Heme: No easy bruising.  Travel history: No recent long distant travel.   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   White matter disease     Past Surgical History:  Procedure Laterality Date   COLONOSCOPY WITH PROPOFOL N/A 07/06/2022   Procedure: COLONOSCOPY WITH PROPOFOL;  Surgeon: [name], MD;  Location: [facility] ENDOSCOPY;  Service: Gastroenterology;  Laterality: N/A;   ESOPHAGOGASTRODUODENOSCOPY Left 03/18/2019   Procedure: ESOPHAGOGASTRODUODENOSCOPY (EGD);  Surgeon: [name], MD;  Location: [facility] ENDOSCOPY;  Service: Gastroenterology;  Laterality: Left;   ESOPHAGOGASTRODUODENOSCOPY N/A 07/06/2022   Procedure: ESOPHAGOGASTRODUODENOSCOPY (EGD);  Surgeon: [name], MD;  Location: [facility] ENDOSCOPY;  Service: Gastroenterology;  Laterality: N/A;   FRACTURE SURGERY     left renal stent placement  10/11/2011   LITHOTRIPSY     ORIF HUMERUS FRACTURE Left 11/18/2014   Procedure: OPEN REDUCTION INTERNAL FIXATION (ORIF) DISTAL HUMERUS FRACTURE;  Surgeon: Budd Palmer, MD;  Location: [facility] OR;   Service: Orthopedics;  Laterality: Left;   REVERSE SHOULDER ARTHROPLASTY Right 10/27/2021   Procedure: REVERSE SHOULDER ARTHROPLASTY;  Surgeon: Bjorn Pippin, MD;  Location: [facility] OR;  Service: Orthopedics;  Laterality: Right;   REVISION TOTAL SHOULDER TO REVERSE TOTAL SHOULDER Right 05/11/2022   Procedure: REVISION TOTAL SHOULDER TO REVERSE TOTAL SHOULDER;  Surgeon: Bjorn Pippin, MD;  Location: [facility] ORS;  Service: Orthopedics;  Laterality: Right;   SPLENECTOMY      Social History:  reports that he has been smoking cigarettes. He has a 50 pack-year smoking history. He has quit using smokeless tobacco.  His smokeless tobacco use included chew. He reports that he does not currently use alcohol after a past usage of about 42.0 standard drinks of alcohol per week. He reports that he does not use drugs.  Family History:  Family History  Problem Relation Age of Onset   Heart disease Mother    Heart disease Father    Diabetes Father    Nephrolithiasis Paternal Grandfather    Kidney disease Neg Hx    Prostate cancer Neg Hx      Prior to Admission medications   Medication Sig Start Date End Date Taking? Authorizing Provider  allopurinol (ZYLOPRIM) 300 MG tablet Take 150 mg by mouth daily. 07/11/22   [provider]  amLODipine (NORVASC) 5 MG tablet Take 5 mg by mouth daily. 07/11/22   [provider]  cefadroxil (DURICEF) 500 MG capsule Take 500 mg by mouth 2 (two) times daily. 08/12/22   [provider]  escitalopram (LEXAPRO) 10 MG tablet Take 10 mg by mouth daily. 07/11/22   [provider]  folic acid (FOLVITE) 1 MG tablet Take 1 mg by mouth daily.    [provider]  guaiFENesin-dextromethorphan (ROBITUSSIN DM) 100-10 MG/5ML syrup Take 10 mLs by mouth every 4 (four) hours as needed for cough. 10/13/22   Gillis Santa, MD  iron polysaccharides (NIFEREX) 150 MG capsule Take 1 capsule (150 mg total) by mouth daily. 10/14/22 01/12/23  Gillis Santa, MD  metFORMIN  (GLUCOPHAGE) 500 MG tablet Take 500 mg by mouth 2 (two) times daily. 06/04/19   [provider]  metoprolol succinate (TOPROL-XL) 50 MG 24 hr tablet Take 0.5 tablets by mouth daily. 07/11/22   [provider]  pantoprazole (PROTONIX) 40 MG tablet Take 1 tablet (40 mg total) by mouth daily. 10/15/22 01/13/23  Gillis Santa, MD  rosuvastatin (CRESTOR) 40 MG tablet Take 20 mg by mouth daily. 07/11/22   [provider]  thiamine (VITAMIN B-1) 100 MG tablet Take 100 mg by mouth daily.    [provider]  traZODone (DESYREL) 50 MG tablet Take 1 tablet by mouth at bedtime. 07/11/22   [provider]    Physical Exam: Vitals:   08/25/23 2200 08/25/23 2239 08/25/23 2249 08/26/23 0018  BP:   126/63 (!) 117/54  Pulse: 79  78 78  Resp: 16  20 16   Temp:   98.2 F (36.8 C) 98.2 F (36.8 C)  TempSrc:   Oral  SpO2: 98%  96% 90%  Weight:  62.3 kg    Height:  5\' 6"  (1.676 m)     General: Not in acute distress. Pale looking HEENT:       Eyes: PERRL, EOMI, no jaundice       ENT: No discharge from the ears and nose, no pharynx injection, no tonsillar enlargement.        Neck: No JVD, no bruit, no mass felt. Heme: No neck lymph node enlargement. Cardiac: S1/S2, RRR, No murmurs, No gallops or rubs. Respiratory: No rales, wheezing, rhonchi or rubs. GI: Soft, nondistended, nontender, no rebound pain, no organomegaly, BS present. GU: No hematuria Ext: No pitting leg edema bilaterally. 1+DP/PT pulse bilaterally. Musculoskeletal: No joint deformities, No joint redness or warmth, no limitation of ROM in spin. Skin: No rashes.  Neuro: Alert, oriented X3, cranial nerves II-XII grossly intact, moves all extremities normally.  Psych: Patient is not psychotic, no suicidal or hemocidal ideation.  Labs on Admission: I have personally reviewed following labs and imaging studies  CBC: Recent Labs  Lab 08/25/23 1754 08/25/23 2136  WBC 9.0 10.0  HGB 6.0* 7.2*  HCT 19.1*  22.9*  MCV 91.4 90.9  PLT 422* 390   Basic Metabolic Panel: Recent Labs  Lab 08/25/23 1754  NA 131*  K 4.8  CL 99  CO2 20*  GLUCOSE 98  BUN 15  CREATININE 1.71*  CALCIUM 9.2   GFR: Estimated Creatinine Clearance: 34.9 mL/min (A) (by C-G formula based on SCr of 1.71 mg/dL (H)). Liver Function Tests: Recent Labs  Lab 08/25/23 1754  AST 22  ALT 13  ALKPHOS 79  BILITOT 0.5  PROT 6.5  ALBUMIN 3.8   No results for input(s): "LIPASE", "AMYLASE" in the last 168 hours. No results for input(s): "AMMONIA" in the last 168 hours. Coagulation Profile: Recent Labs  Lab 08/25/23 2136  INR 1.0   Cardiac Enzymes: No results for input(s): "CKTOTAL", "CKMB", "CKMBINDEX", "TROPONINI" in the last 168 hours. BNP (last 3 results) No results for input(s): "PROBNP" in the last 8760 hours. HbA1C: No results for input(s): "HGBA1C" in the last 72 hours. CBG: Recent Labs  Lab 08/25/23 2316  GLUCAP 100*   Lipid Profile: No results for input(s): "CHOL", "HDL", "LDLCALC", "TRIG", "CHOLHDL", "LDLDIRECT" in the last 72 hours. Thyroid Function Tests: No results for input(s): "TSH", "T4TOTAL", "FREET4", "T3FREE", "THYROIDAB" in the last 72 hours. Anemia Panel: Recent Labs    08/25/23 1730  FOLATE >40.0  FERRITIN 5*  TIBC 456*  IRON 16*  RETICCTPCT 3.6*   Urine analysis:    Component Value Date/Time   COLORURINE YELLOW (A) 10/18/2021 1755   APPEARANCEUR HAZY (A) 10/18/2021 1755   APPEARANCEUR Clear 05/11/2015 1054   LABSPEC 1.012 10/18/2021 1755   LABSPEC 1.014 01/15/2014 1320   PHURINE 5.0 10/18/2021 1755   GLUCOSEU NEGATIVE 10/18/2021 1755   GLUCOSEU Negative 01/15/2014 1320   HGBUR MODERATE (A) 10/18/2021 1755   BILIRUBINUR NEGATIVE 10/18/2021 1755   BILIRUBINUR Negative 05/11/2015 1054   BILIRUBINUR Negative 01/15/2014 1320   KETONESUR NEGATIVE 10/18/2021 1755   PROTEINUR 100 (A) 10/18/2021 1755   UROBILINOGEN 1.0 11/29/2014 1534   NITRITE NEGATIVE 10/18/2021 1755    LEUKOCYTESUR NEGATIVE 10/18/2021 1755   LEUKOCYTESUR Negative 01/15/2014 1320   Sepsis Labs: @LABRCNTIP (procalcitonin:4,lacticidven:4) )No results found for this or any previous visit (from the past 240 hour(s)).   Radiological Exams on Admission: No results found.    Assessment/Plan Principal Problem:   Symptomatic anemia Active Problems:  Iron deficiency anemia   HTN (hypertension)   Hyperlipidemia   CAD (coronary artery disease)   Type II diabetes mellitus with renal manifestations (HCC)   COPD (chronic obstructive pulmonary disease) (HCC)   Acute renal failure superimposed on stage 3b chronic kidney disease (HCC)   Gout   Diarrhea   Septic joint of right shoulder region [facility])   Depression with anxiety   Alcohol abuse   Tobacco abuse   Assessment and Plan:  Symptomatic anemia and hx of iron deficiency anemia: Hgb 9.4 --> 6.0.  Possibly due to GI bleeding.  Message sent to Dr. Norma Fredrickson of GI for consultation.  - will place in med-surg bed obs -Hold aspirin - transfuse 2 units of blood now - IVF: NS at 75 mL/hr - Start IV pantoprazole 40 mg bid - Check anemia panel  -Continue iron supplement - Zofran IV for nausea - Avoid NSAIDs and SQ heparin - Maintain IV access (2 large bore IVs if possible). - Monitor closely and follow q6h cbc, transfuse as necessary, if Hgb<7.0 - LaB: INR, PTT and type screen  HTN (hypertension) -Hold blood pressure medications including amlodipine and metoprolol due to high risk of developing hypotension -I hydralazine as needed  Hyperlipidemia -Crestor  CAD (coronary artery disease) -Hold aspirin -Crestor  Type II diabetes mellitus with renal manifestations (HCC): Recent A1c 5.8, well-controlled.  Patient taking metformin -Sliding scale insulin  COPD (chronic obstructive pulmonary disease) (HCC): Stable -Bronchodilators and as needed Mucinex  Acute renal failure superimposed on stage 3b chronic kidney disease (HCC): Recent  baseline creatinine 1.20 on 1/60/24.  His creatinine is 1.71, BUN 15, GFR 42.  Likely due to dehydration secondary to diarrhea -IV fluid as above -Avoid using renal toxic medications  Gout -Allopurinol  Diarrhea -Follow-up C. 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 -Continue home Duricef  Depression with anxiety -Continue home medications  Alcohol abuse and Tobacco abuse -CIWA protocol -Nicotine patch       DVT ppx: SCD  Code Status: DNR per his sister who is POA  Family Communication:   Yes, patient's sister  at bed side.     Disposition Plan:  Anticipate discharge back to previous environment  Consults called:  Dr. Norma Fredrickson of GI  Admission status and Level of care: Telemetry Medical:  for obs    Dispo: The patient is from: Home              Anticipated d/c is to: Home              Anticipated d/c date is: 1 day              Patient currently is not medically stable to d/c.    Severity of Illness:  The appropriate patient status for this patient is OBSERVATION. Observation status is judged to be reasonable and necessary in order to provide the required intensity of service to ensure the patient's safety. The patient's presenting symptoms, physical exam findings, and initial radiographic and laboratory data in the context of their medical condition is felt to place them at decreased risk for further clinical deterioration. Furthermore, it is anticipated that the patient will be medically stable for discharge from the hospital within 2 midnights of admission.        Date of Service 08/26/2023    Lorretta Harp [facility]   If 7PM-7AM, please contact night-coverage [email or web address] 08/26/2023, 12:30 AM

## 2023-08-26 DIAGNOSIS — D649 Anemia, unspecified: Secondary | ICD-10-CM

## 2023-08-26 LAB — PREPARE RBC (CROSSMATCH)

## 2023-08-26 LAB — BASIC METABOLIC PANEL
Anion gap: 9 (ref 5–15)
BUN: 14 mg/dL (ref 8–23)
CO2: 25 mmol/L (ref 22–32)
Calcium: 9 mg/dL (ref 8.9–10.3)
Chloride: 101 mmol/L (ref 98–111)
Creatinine, Ser: 1.61 mg/dL — ABNORMAL HIGH (ref 0.61–1.24)
GFR, Estimated: 45 mL/min — ABNORMAL LOW (ref >60)
Glucose, Bld: 91 mg/dL (ref 70–99)
Potassium: 3.9 mmol/L (ref 3.5–5.1)
Sodium: 135 mmol/L (ref 135–145)

## 2023-08-26 LAB — GLUCOSE, CAPILLARY
Glucose-Capillary: 79 mg/dL (ref 70–99)
Glucose-Capillary: 97 mg/dL (ref 70–99)
Glucose-Capillary: 86 mg/dL (ref 70–99)
Glucose-Capillary: 78 mg/dL (ref 70–99)

## 2023-08-26 LAB — CBC
HCT: 22.2 % — ABNORMAL LOW (ref 39.0–52.0)
HCT: 27.9 % — ABNORMAL LOW (ref 39.0–52.0)
HCT: 26.4 % — ABNORMAL LOW (ref 39.0–52.0)
Hemoglobin: 7.3 g/dL — ABNORMAL LOW (ref 13.0–17.0)
Hemoglobin: 9 g/dL — ABNORMAL LOW (ref 13.0–17.0)
Hemoglobin: 8.7 g/dL — ABNORMAL LOW (ref 13.0–17.0)
MCH: 28.7 pg (ref 26.0–34.0)
MCH: 29.1 pg (ref 26.0–34.0)
MCH: 29.6 pg (ref 26.0–34.0)
MCHC: 32.9 g/dL (ref 30.0–36.0)
MCHC: 32.3 g/dL (ref 30.0–36.0)
MCHC: 33 g/dL (ref 30.0–36.0)
MCV: 87.4 fL (ref 80.0–100.0)
MCV: 90.3 fL (ref 80.0–100.0)
MCV: 89.8 fL (ref 80.0–100.0)
Platelets: 382 10*3/uL (ref 150–400)
Platelets: 393 10*3/uL (ref 150–400)
Platelets: 384 10*3/uL (ref 150–400)
RBC: 2.54 MIL/uL — ABNORMAL LOW (ref 4.22–5.81)
RBC: 3.09 MIL/uL — ABNORMAL LOW (ref 4.22–5.81)
RBC: 2.94 MIL/uL — ABNORMAL LOW (ref 4.22–5.81)
RDW: 18 % — ABNORMAL HIGH (ref 11.5–15.5)
RDW: 17.7 % — ABNORMAL HIGH (ref 11.5–15.5)
RDW: 17.9 % — ABNORMAL HIGH (ref 11.5–15.5)
WBC: 11.5 10*3/uL — ABNORMAL HIGH (ref 4.0–10.5)
WBC: 8.8 10*3/uL (ref 4.0–10.5)
WBC: 8.5 10*3/uL (ref 4.0–10.5)
nRBC: 0.6 % — ABNORMAL HIGH (ref 0.0–0.2)
nRBC: 0.6 % — ABNORMAL HIGH (ref 0.0–0.2)
nRBC: 0.6 % — ABNORMAL HIGH (ref 0.0–0.2)

## 2023-08-26 LAB — FERRITIN: Ferritin: 5 ng/mL — ABNORMAL LOW (ref 24–336)

## 2023-08-26 LAB — IRON AND TIBC
Iron: 16 ug/dL — ABNORMAL LOW (ref 45–182)
Saturation Ratios: 4 % — ABNORMAL LOW (ref 17.9–39.5)
TIBC: 456 ug/dL — ABNORMAL HIGH (ref 250–450)
UIBC: 440 ug/dL

## 2023-08-26 LAB — FOLATE: Folate: >40 ng/mL (ref >5.9)

## 2023-08-26 LAB — VITAMIN B12: Vitamin B-12: 902 pg/mL (ref 180–914)

## 2023-08-26 MED ORDER — SODIUM CHLORIDE 0.9% IV SOLUTION: Freq: Once | INTRAVENOUS | Status: DC

## 2023-08-26 MED ORDER — POLYSACCHARIDE IRON COMPLEX 150 MG PO CAPS: 150.0000 mg | ORAL_CAPSULE | Freq: Every day | ORAL | Status: DC

## 2023-08-26 MED ORDER — SODIUM CHLORIDE 0.9% FLUSH
10.0000 mL | Freq: Two times a day (BID) | INTRAVENOUS | Status: DC
  Administered 2023-08-26 – 2023-08-27 (×3): 10 mL

## 2023-08-26 MED ORDER — ESCITALOPRAM OXALATE 10 MG PO TABS
10.0000 mg | ORAL_TABLET | Freq: Every day | ORAL | Status: DC
  Administered 2023-08-26 – 2023-08-27 (×2): 10 mg via ORAL
  Filled 2023-08-26 (×2): qty 1

## 2023-08-26 MED ORDER — TRAZODONE HCL 50 MG PO TABS
50.0000 mg | ORAL_TABLET | Freq: Every day | ORAL | Status: DC
  Administered 2023-08-26: 50 mg via ORAL
  Filled 2023-08-26: qty 1

## 2023-08-26 MED ORDER — ALLOPURINOL 300 MG PO TABS
150.0000 mg | ORAL_TABLET | Freq: Every day | ORAL | Status: DC
  Administered 2023-08-26 – 2023-08-27 (×2): 150 mg via ORAL
  Filled 2023-08-26 (×2): qty 1

## 2023-08-26 MED ORDER — CHLORHEXIDINE GLUCONATE CLOTH 2 % EX PADS
6.0000 | MEDICATED_PAD | Freq: Every day | CUTANEOUS | Status: DC
  Administered 2023-08-26 – 2023-08-27 (×2): 6 via TOPICAL

## 2023-08-26 MED ORDER — ROSUVASTATIN CALCIUM 10 MG PO TABS
20.0000 mg | ORAL_TABLET | Freq: Every day | ORAL | Status: DC
  Administered 2023-08-26 – 2023-08-27 (×2): 20 mg via ORAL
  Filled 2023-08-26 (×2): qty 2

## 2023-08-26 MED ORDER — CEFADROXIL 500 MG PO CAPS
500.0000 mg | ORAL_CAPSULE | Freq: Every day | ORAL | Status: DC
Start: 2023-08-26 — End: 2023-08-27
  Administered 2023-08-26 – 2023-08-27 (×2): 500 mg via ORAL
  Filled 2023-08-26 (×2): qty 1

## 2023-08-26 NOTE — Consult Note (Signed)
[facility] GI Inpatient Consult Note   Jamey Reas, M.D.  Reason for Consult: Iron deficiency anemia   Attending Requesting Consult: Lorretta Harp, M.D.   History of Present Illness: ARTEEN MORAITIS is a 72 y.o. male with history of alcohol abuse, splenectomy, infected shoulder arthroplasty who presented to the emergency room yesterday on advice of his primary care doctor for a drop in his hemoglobin.  Patient was essentially asymptomatic without symptoms of dizziness or weakness but did have some shortness of breath.  He has had some infrequent nausea and vomiting which is nonbloody.  He has a history of COPD.  Although he has complained of stool being dark, he he denies any frank melena or hematochezia.  He has not had any hematemesis.  He has undergone an endoluminal evaluation in September 2023 by Dr. [name] revealing a hiatal hernia on EGD (hiatal hernia only) and colonoscopy which revealed sigmoid diverticulosis and a small sigmoid tubular adenoma.  The workup was being done at that time for unexplained weight loss and a possible colon mass on x-ray.  Of course, this was ruled out. Currently patient has no symptoms of nausea, vomiting or abdominal pain. His bowels have not moved since hospital admission. He is on contact precautions but there has been no stool sample yet to submit. He would like to be discharged to home.  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   Diabetes mellitus without complication (HCC)    Gout    Gout    Headache    Hypercholesteremia    Hypertension    Kidney stones    Left flank pain, chronic    Pelvic fracture (HCC) 11/17/2014   Pneumonia    Prolonged QT syndrome    Septic arthritis of shoulder, right (HCC) 05/11/2022   White matter disease     Problem List: Patient Active Problem  List   Diagnosis Date Noted   Symptomatic anemia 08/25/2023   Iron deficiency anemia 08/25/2023   Depression with anxiety 08/25/2023   Type II diabetes mellitus with renal manifestations (HCC) 08/25/2023   COPD (chronic obstructive pulmonary disease) (HCC) 08/25/2023   Anxiety state 10/14/2022   COPD exacerbation (HCC) 10/11/2022   Unintentional weight loss    Adenomatous polyp of colon    Septic joint of right shoulder region (HCC) 05/11/2022   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 05/11/2022   Long term (current) use of aspirin 11/26/2021   Anxiety disorder, unspecified 11/15/2021   Long Q-T syndrome 11/15/2021   Streptococcal bacteremia 10/26/2021   Closed fracture of right proximal humerus 10/26/2021   Urinary retention 10/26/2021   Hyponatremia 10/26/2021   Metabolic acidosis    Shock (HCC)    Rhabdomyolysis 10/18/2021   Delayed emergence from anesthesia 03/18/2019   Hypokalemia 03/14/2019   Alcoholic ketoacidosis 02/21/2019   Alcohol withdrawal syndrome with complication (HCC) 02/21/2019   Diabetes (HCC) 02/21/2019   Left rotator cuff tear arthropathy 12/28/2015   Hyperlipidemia 12/24/2015   Tobacco abuse 11/06/2015   Mood disturbance 11/06/2015   CAD (coronary artery disease) 10/16/2015   PAD (peripheral artery disease) (HCC) 10/16/2015   Preventative health care 08/13/2015   Renal artery stenosis (HCC) 07/30/2015   Acute renal failure superimposed on stage 3b  chronic kidney disease (HCC) 07/30/2015   Kidney atrophy 07/30/2015   Chronic renal failure, stage 3b (HCC) 07/30/2015   Acute renal failure (ARF) (HCC) 07/23/2015   Insomnia 07/22/2015   Diarrhea 07/22/2015   Anemia 07/15/2015   History of substance abuse (HCC) 07/15/2015   BPH (benign prostatic hyperplasia) 07/15/2015   Gout 07/15/2015   Kidney stones 05/11/2015   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) 04/08/2015   HTN (hypertension) 11/17/2014   Alcohol abuse 11/17/2014     Past Surgical History: Past Surgical History:  Procedure Laterality Date   COLONOSCOPY WITH PROPOFOL N/A 07/06/2022   Procedure: COLONOSCOPY WITH PROPOFOL;  Surgeon: [name], MD;  Location: [facility] ENDOSCOPY;  Service: Gastroenterology;  Laterality: N/A;   ESOPHAGOGASTRODUODENOSCOPY Left 03/18/2019   Procedure: ESOPHAGOGASTRODUODENOSCOPY (EGD);  Surgeon: [name], MD;  Location: [facility] ENDOSCOPY;  Service: Gastroenterology;  Laterality: Left;   ESOPHAGOGASTRODUODENOSCOPY N/A 07/06/2022   Procedure: ESOPHAGOGASTRODUODENOSCOPY (EGD);  Surgeon: [name], MD;  Location: [facility] ENDOSCOPY;  Service: Gastroenterology;  Laterality: N/A;   FRACTURE SURGERY     left renal stent placement  10/11/2011   LITHOTRIPSY     ORIF HUMERUS FRACTURE Left 11/18/2014   Procedure: OPEN REDUCTION INTERNAL FIXATION (ORIF) DISTAL HUMERUS FRACTURE;  Surgeon: Budd Palmer, MD;  Location: [facility] OR;  Service: Orthopedics;  Laterality: Left;   REVERSE SHOULDER ARTHROPLASTY Right 10/27/2021   Procedure: REVERSE SHOULDER ARTHROPLASTY;  Surgeon: Bjorn Pippin, MD;  Location: [facility] OR;  Service: Orthopedics;  Laterality: Right;   REVISION TOTAL SHOULDER TO REVERSE TOTAL SHOULDER Right 05/11/2022   Procedure: REVISION TOTAL SHOULDER TO REVERSE TOTAL SHOULDER;  Surgeon: Bjorn Pippin, MD;  Location: [facility] ORS;  Service: Orthopedics;  Laterality: Right;   SPLENECTOMY      Allergies: No Known Allergies  Home Medications: Medications Prior to Admission  Medication Sig Dispense Refill Last Dose   allopurinol (ZYLOPRIM) 300 MG tablet Take 150 mg by mouth daily.      amLODipine (NORVASC) 5 MG tablet Take 5 mg by mouth daily.      cefadroxil (DURICEF) 500 MG capsule Take 250 mg by mouth 2 (two) times daily.      escitalopram (LEXAPRO) 10 MG tablet Take 10 mg by mouth daily.      folic acid (FOLVITE) 1 MG tablet Take 1 mg by mouth daily.      metFORMIN (GLUCOPHAGE) 500 MG tablet Take 500 mg by mouth 2 (two) times daily.       metoprolol succinate (TOPROL-XL) 50 MG 24 hr tablet Take 0.5 tablets by mouth daily.      rosuvastatin (CRESTOR) 40 MG tablet Take 20 mg by mouth daily.      thiamine (VITAMIN B-1) 100 MG tablet Take 100 mg by mouth daily.      traZODone (DESYREL) 50 MG tablet Take 1 tablet by mouth at bedtime.      Home medication reconciliation was completed with the patient.   Scheduled Inpatient Medications:    sodium chloride   Intravenous Once   allopurinol  150 mg Oral Daily   cefadroxil  500 mg Oral Daily   Chlorhexidine Gluconate Cloth  6 each Topical Daily   escitalopram  10 mg Oral Daily   folic acid  1 mg Oral Daily   insulin aspart  0-5 Units Subcutaneous QHS   insulin aspart  0-9 Units Subcutaneous TID WC   LORazepam  0-4 mg Intravenous Q6H   Followed by   Melene Muller ON 08/27/2023] LORazepam  0-4 mg Intravenous Q12H  multivitamin with minerals  1 tablet Oral Daily   nicotine  21 mg Transdermal Daily   pantoprazole (PROTONIX) IV  40 mg Intravenous Q12H   rosuvastatin  20 mg Oral Daily   sodium chloride flush  10-40 mL Intracatheter Q12H   thiamine  100 mg Oral Daily   Or   thiamine  100 mg Intravenous Daily   traZODone  50 mg Oral QHS    Continuous Inpatient Infusions:    sodium chloride 75 mL/hr at 08/26/23 0427    PRN Inpatient Medications:  acetaminophen, albuterol, dextromethorphan-guaiFENesin, hydrALAZINE, LORazepam **OR** LORazepam, ondansetron (ZOFRAN) IV  Family History: family history includes Diabetes in his father; Heart disease in his father and mother; Nephrolithiasis in his paternal grandfather.   GI Family History: Negative  Social History:   reports that he has been smoking cigarettes. He has a 50 pack-year smoking history. He has quit using smokeless tobacco.  His smokeless tobacco use included chew. He reports that he does not currently use alcohol after a past usage of about 42.0 standard drinks of alcohol per week. He reports that he does not use drugs. The  patient denies ETOH, tobacco, or drug use.    Review of Systems: Review of Systems - Negative except HPI  Physical Examination: BP 120/61 (BP Location: Left Arm)   Pulse 68   Temp 97.7 F (36.5 C)   Resp 16   Ht 5\' 6"  (1.676 m)   Wt 62.3 kg   SpO2 91%   BMI 22.18 kg/m  Physical Exam Constitutional:      General: He is not in acute distress.    Appearance: Normal appearance. He is normal weight. He is not ill-appearing or diaphoretic.  HENT:     Head: Normocephalic and atraumatic.     Nose: Nose normal.     Mouth/Throat:     Mouth: Mucous membranes are moist.     Pharynx: Oropharynx is clear. No oropharyngeal exudate or posterior oropharyngeal erythema.  Eyes:     General: No scleral icterus.       Right eye: No discharge.        Left eye: No discharge.     Extraocular Movements: Extraocular movements intact.  Cardiovascular:     Rate and Rhythm: Normal rate.     Heart sounds: Heart sounds not distant. No murmur heard. Chest:     Chest wall: No mass.  Abdominal:     General: Abdomen is flat. Bowel sounds are normal. There is no distension.     Palpations: Abdomen is soft. There is no shifting dullness.     Tenderness: There is no abdominal tenderness.  Musculoskeletal:     Cervical back: Normal range of motion.  Lymphadenopathy:     Upper Body:     Right upper body: No supraclavicular adenopathy.     Left upper body: No supraclavicular adenopathy.  Neurological:     Mental Status: He is alert.     Data: Lab Results  Component Value Date   WBC 8.8 08/26/2023   HGB 9.0 (L) 08/26/2023   HCT 27.9 (L) 08/26/2023   MCV 90.3 08/26/2023   PLT 393 08/26/2023   Recent Labs  Lab 08/25/23 2136 08/26/23 0153 08/26/23 0813  HGB 7.2* 7.3* 9.0*   Lab Results  Component Value Date   NA 135 08/26/2023   K 3.9 08/26/2023   CL 101 08/26/2023   CO2 25 08/26/2023   BUN 14 08/26/2023   CREATININE 1.61 (H) 08/26/2023  GLU 82 08/11/2015   Lab Results  Component  Value Date   ALT 13 08/25/2023   AST 22 08/25/2023   ALKPHOS 79 08/25/2023   BILITOT 0.5 08/25/2023   Recent Labs  Lab 08/25/23 2136  APTT 24  INR 1.0      Latest Ref Rng & Units 08/26/2023    8:13 AM 08/26/2023    1:53 AM 08/25/2023    9:36 PM  CBC  WBC 4.0 - 10.5 K/uL 8.8  11.5  10.0   Hemoglobin 13.0 - 17.0 g/dL 9.0  7.3  7.2   Hematocrit 39.0 - 52.0 % 27.9  22.2  22.9   Platelets 150 - 400 K/uL 393  382  390     STUDIES: No results found. @IMAGES @  Assessment: Asymptomatic anemia - S/p 2 u prbcs.  Chronic anemia - No gastrointestinal etiology is likely. CAD, stable Type II DM Hx of adenomatous colon polyp, Sep 2023 - Colonoscopy Hiatal hernia - per EGD 06/2022 - EGD.  COVID-19 status:   Recommendations:  Nothing to do from a GI standpoint. Advance diet. Discharge home ok from a GI standpoint. Case discussed with attending, Dr. Denton Lank.  Thank you for the consult. Please call with questions or concerns.  Rosina Lowenstein, "Mellody Dance MD [facility] [address] [place], [place] [address] [phone]  08/26/2023 12:40 PM

## 2023-08-26 NOTE — Progress Notes (Signed)
Progress Note   Patient: Edward Singleton [id number]:[id number] DOB: 1951-02-01 DOA: 08/25/2023     0 DOS: the patient was seen and examined on 08/26/2023   Brief hospital course: HPI on admission 08/25/23:  "Edward Singleton is a 72 y.o. 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 ..." See H&P for full HPI on admission and ED course.  Pt admitted with acute on chronic anemia with Hbg initially 6.0. Transfused 2 units pRBC's on admission. Gastroenterology consulted for evaluation.  Further hospital course and management as outlined below.   Assessment and Plan:  Symptomatic anemia  Hx of iron deficiency anemia:  Hgb 9.4 in Jan --> 6.0 on admission.   Possibly due to GI bleeding.   S/p Transfusion 2 units pRBC's on admission 11/15 Hbg trend 6 >> 7.2 >> 7.3 >> 9.0 >> 8.7 --GI, Dr. Norma Fredrickson consulted --Clear liquids for now, diet per GI --Hold aspirin --Maintenance IVF: NS at 75 mL/hr --IV pantoprazole 40 mg bid --Follow anemia panel --Continue iron supplement --Zofran IV for nausea --Avoid NSAIDs and SQ heparin --Maintain IV access (2 large bore IVs if possible). --Monitor closely and follow q6h cbc, transfuse as necessary, if Hgb<7.0   HTN (hypertension) --Holding medications (amlodipine and metoprolol) due to high risk of developing hypotension --IV hydralazine PRN   Hyperlipidemia --Crestor   CAD (coronary artery disease) --Hold aspirin --Crestor   Type II diabetes mellitus with renal manifestations (HCC): Recent A1c 5.8, well-controlled.  Patient taking metformin -Sliding scale insulin   COPD (chronic obstructive pulmonary disease) (HCC): Stable --Bronchodilators and as needed Mucinex   Acute renal failure superimposed on  stage 3b chronic kidney disease (HCC): Recent baseline creatinine 1.20 on 1/60/24.  His creatinine is 1.71, BUN 15, GFR 42.  Likely due to dehydration secondary to diarrhea --IV fluid as above --Avoid using renal toxic medications   Gout --Allopurinol   Diarrhea --Follow-up C. 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 --Continue home Duricef   Depression with anxiety --Continue home medications   Alcohol abuse  --CIWA protocol  Tobacco abuse --Nicotine patch      Subjective: Pt awake resting in bed this AM. Reports feeling somewhat better after blood transfusions.  Was having all watery diarrhea at home.  Denies fever/chills.  Physical Exam: Vitals:   08/26/23 0051 08/26/23 0110 08/26/23 0417 08/26/23 0741  BP: (!) 108/47 (!) 109/47 (!) 126/48 120/61  Pulse: 75 76 72 68  Resp: 16 16 16 16   Temp: 98.6 F (37 C) 98 F (36.7 C) 98.4 F (36.9 C) 97.7 F (36.5 C)  TempSrc: Oral Oral Oral   SpO2: 90% 90% 95% 91%  Weight:      Height:       General exam: awake, alert, no acute distress HEENT: atraumatic, clear conjunctiva, anicteric sclera, moist mucus membranes, hearing grossly normal  Respiratory system: CTA, no wheezes, rales or rhonchi, normal respiratory effort. Cardiovascular system: normal S1/S2, RRR, no pedal edema.   Gastrointestinal system: soft, NT, ND, no HSM felt, +bowel sounds. Central nervous system: A&O x3. no gross focal neurologic deficits, normal speech Extremities: moves all, no edema, normal tone Skin: dry, intact, normal temperature Psychiatry: normal mood, congruent affect, judgement and insight appear normal   Data Reviewed:  Notable labs ---   Hbg 6.0 >> 7.2 >> 7.3 >> 9.0 >> 8.7 Cr 1.71 >> 1.61  Family Communication: none  Disposition: Status is: Observation The patient remains OBS appropriate and will d/c before 2 midnights. Remains admitted for pending GI evaluation    Planned Discharge  Destination: Home    Time spent: 42 minutes  Author: Pennie Banter, DO 08/26/2023 2:15 PM  For on call review [email or web address].

## 2023-08-26 NOTE — Plan of Care (Signed)
  Problem: Elimination: Goal: Will not experience complications related to bowel motility Outcome: Progressing Goal: Will not experience complications related to urinary retention Outcome: Progressing   Problem: Pain Management: Goal: General experience of comfort will improve Outcome: Progressing   Problem: Safety: Goal: Ability to remain free from injury will improve Outcome: Progressing

## 2023-08-27 DIAGNOSIS — D649 Anemia, unspecified: Secondary | ICD-10-CM | POA: Diagnosis not present

## 2023-08-27 LAB — TYPE AND SCREEN
ABO/RH(D): A POS
Antibody Screen: NEGATIVE
Unit division: 0
Unit division: 0

## 2023-08-27 LAB — GLUCOSE, CAPILLARY
Glucose-Capillary: 87 mg/dL (ref 70–99)
Glucose-Capillary: 78 mg/dL (ref 70–99)

## 2023-08-27 LAB — CBC
HCT: 25.8 % — ABNORMAL LOW (ref 39.0–52.0)
Hemoglobin: 8.7 g/dL — ABNORMAL LOW (ref 13.0–17.0)
MCH: 29.6 pg (ref 26.0–34.0)
MCHC: 33.7 g/dL (ref 30.0–36.0)
MCV: 87.8 fL (ref 80.0–100.0)
Platelets: 403 10*3/uL — ABNORMAL HIGH (ref 150–400)
RBC: 2.94 MIL/uL — ABNORMAL LOW (ref 4.22–5.81)
RDW: 17.7 % — ABNORMAL HIGH (ref 11.5–15.5)
WBC: 7.3 10*3/uL (ref 4.0–10.5)
nRBC: 0.7 % — ABNORMAL HIGH (ref 0.0–0.2)

## 2023-08-27 LAB — BASIC METABOLIC PANEL
Anion gap: 9 (ref 5–15)
BUN: 13 mg/dL (ref 8–23)
CO2: 24 mmol/L (ref 22–32)
Calcium: 9 mg/dL (ref 8.9–10.3)
Chloride: 100 mmol/L (ref 98–111)
Creatinine, Ser: 1.69 mg/dL — ABNORMAL HIGH (ref 0.61–1.24)
GFR, Estimated: 43 mL/min — ABNORMAL LOW (ref >60)
Glucose, Bld: 81 mg/dL (ref 70–99)
Potassium: 3.8 mmol/L (ref 3.5–5.1)
Sodium: 133 mmol/L — ABNORMAL LOW (ref 135–145)

## 2023-08-27 LAB — BPAM RBC
Blood Product Expiration Date: 202412112359
Blood Product Expiration Date: 202412122359
ISSUE DATE / TIME: 202411160045
ISSUE DATE / TIME: 202411151954
Unit Type and Rh: 6200
Unit Type and Rh: 6200

## 2023-08-27 LAB — MAGNESIUM: Magnesium: 2.4 mg/dL (ref 1.7–2.4)

## 2023-08-27 NOTE — Progress Notes (Signed)
PT Cancellation Note  Patient Details Name: Edward Singleton MRN: [id number] DOB: 01-04-1951   Cancelled Treatment:    Reason Eval/Treat Not Completed: Other (comment) Pt refused mobility/evaluation with PT. Did appear frustrated by questions and stated he had been up and ambulating with the RW (at the RN insistence per pt). He stated he felt fine mobilizing and that he was back at his baseline, MD notified and PT to sign off.    Olga Coaster PT, DPT 12:02 PM,08/27/23

## 2023-08-27 NOTE — Plan of Care (Signed)
Problem: Education: Goal: Ability to describe self-care measures that may prevent or decrease complications (Diabetes Survival Skills Education) will improve 08/27/2023 1243 by Genevie Ann, RN Outcome: Adequate for Discharge 08/27/2023 1243 by Genevie Ann, RN Outcome: Adequate for Discharge 08/27/2023 1144 by Genevie Ann, RN Outcome: Progressing 08/27/2023 1144 by Genevie Ann, RN Outcome: Progressing Goal: Individualized Educational Video(s) 08/27/2023 1243 by Genevie Ann, RN Outcome: Adequate for Discharge 08/27/2023 1243 by Genevie Ann, RN Outcome: Adequate for Discharge 08/27/2023 1144 by Genevie Ann, RN Outcome: Progressing   Problem: Coping: Goal: Ability to adjust to condition or change in health will improve 08/27/2023 1243 by Genevie Ann, RN Outcome: Adequate for Discharge 08/27/2023 1243 by Genevie Ann, RN Outcome: Adequate for Discharge   Problem: Fluid Volume: Goal: Ability to maintain a balanced intake and output will improve 08/27/2023 1243 by Genevie Ann, RN Outcome: Adequate for Discharge 08/27/2023 1243 by Genevie Ann, RN Outcome: Adequate for Discharge   Problem: Health Behavior/Discharge Planning: Goal: Ability to identify and utilize available resources and services will improve 08/27/2023 1243 by Genevie Ann, RN Outcome: Adequate for Discharge 08/27/2023 1243 by Genevie Ann, RN Outcome: Adequate for Discharge Goal: Ability to manage health-related needs will improve 08/27/2023 1243 by Genevie Ann, RN Outcome: Adequate for Discharge 08/27/2023 1243 by Genevie Ann, RN Outcome: Adequate for Discharge   Problem: Metabolic: Goal: Ability to maintain appropriate glucose levels will improve 08/27/2023 1243 by Genevie Ann, RN Outcome: Adequate for Discharge 08/27/2023 1243 by Genevie Ann, RN Outcome: Adequate for Discharge   Problem:  Nutritional: Goal: Maintenance of adequate nutrition will improve 08/27/2023 1243 by Genevie Ann, RN Outcome: Adequate for Discharge 08/27/2023 1243 by Genevie Ann, RN Outcome: Adequate for Discharge Goal: Progress toward achieving an optimal weight will improve 08/27/2023 1243 by Genevie Ann, RN Outcome: Adequate for Discharge 08/27/2023 1243 by Genevie Ann, RN Outcome: Adequate for Discharge   Problem: Skin Integrity: Goal: Risk for impaired skin integrity will decrease 08/27/2023 1243 by Genevie Ann, RN Outcome: Adequate for Discharge 08/27/2023 1243 by Genevie Ann, RN Outcome: Adequate for Discharge   Problem: Tissue Perfusion: Goal: Adequacy of tissue perfusion will improve 08/27/2023 1243 by Genevie Ann, RN Outcome: Adequate for Discharge 08/27/2023 1243 by Genevie Ann, RN Outcome: Adequate for Discharge   Problem: Education: Goal: Knowledge of General Education information will improve Description: Including pain rating scale, medication(s)/side effects and non-pharmacologic comfort measures 08/27/2023 1243 by Genevie Ann, RN Outcome: Adequate for Discharge 08/27/2023 1243 by Genevie Ann, RN Outcome: Adequate for Discharge   Problem: Health Behavior/Discharge Planning: Goal: Ability to manage health-related needs will improve 08/27/2023 1243 by Genevie Ann, RN Outcome: Adequate for Discharge 08/27/2023 1243 by Genevie Ann, RN Outcome: Adequate for Discharge   Problem: Clinical Measurements: Goal: Ability to maintain clinical measurements within normal limits will improve 08/27/2023 1243 by Genevie Ann, RN Outcome: Adequate for Discharge 08/27/2023 1243 by Genevie Ann, RN Outcome: Adequate for Discharge Goal: Will remain free from infection 08/27/2023 1243 by Genevie Ann, RN Outcome: Adequate for Discharge 08/27/2023 1243 by Genevie Ann, RN Outcome: Adequate for  Discharge Goal: Diagnostic test results will improve 08/27/2023 1243 by Genevie Ann, RN Outcome: Adequate for Discharge 08/27/2023 1243 by Genevie Ann, RN Outcome: Adequate for Discharge Goal: Respiratory complications will improve 08/27/2023 1243 by Genevie Ann, RN Outcome: Adequate  for Discharge 08/27/2023 1243 by Genevie Ann, RN Outcome: Adequate for Discharge Goal: Cardiovascular complication will be avoided 08/27/2023 1243 by Genevie Ann, RN Outcome: Adequate for Discharge 08/27/2023 1243 by Genevie Ann, RN Outcome: Adequate for Discharge   Problem: Activity: Goal: Risk for activity intolerance will decrease 08/27/2023 1243 by Genevie Ann, RN Outcome: Adequate for Discharge 08/27/2023 1243 by Genevie Ann, RN Outcome: Adequate for Discharge   Problem: Nutrition: Goal: Adequate nutrition will be maintained 08/27/2023 1243 by Genevie Ann, RN Outcome: Adequate for Discharge 08/27/2023 1243 by Genevie Ann, RN Outcome: Adequate for Discharge   Problem: Coping: Goal: Level of anxiety will decrease 08/27/2023 1243 by Genevie Ann, RN Outcome: Adequate for Discharge 08/27/2023 1243 by Genevie Ann, RN Outcome: Adequate for Discharge   Problem: Elimination: Goal: Will not experience complications related to bowel motility 08/27/2023 1243 by Genevie Ann, RN Outcome: Adequate for Discharge 08/27/2023 1243 by Genevie Ann, RN Outcome: Adequate for Discharge Goal: Will not experience complications related to urinary retention 08/27/2023 1243 by Genevie Ann, RN Outcome: Adequate for Discharge 08/27/2023 1243 by Genevie Ann, RN Outcome: Adequate for Discharge   Problem: Pain Management: Goal: General experience of comfort will improve 08/27/2023 1243 by Genevie Ann, RN Outcome: Adequate for Discharge 08/27/2023 1243 by Genevie Ann, RN Outcome: Adequate for Discharge    Problem: Safety: Goal: Ability to remain free from injury will improve 08/27/2023 1243 by Genevie Ann, RN Outcome: Adequate for Discharge 08/27/2023 1243 by Genevie Ann, RN Outcome: Adequate for Discharge   Problem: Skin Integrity: Goal: Risk for impaired skin integrity will decrease Outcome: Adequate for Discharge

## 2023-08-27 NOTE — Discharge Summary (Signed)
Physician Discharge Summary   Patient: Edward Singleton MRN: [id number] DOB: 1951-08-12  Admit date:     08/25/2023  Discharge date: 08/27/23  Discharge Physician: Pennie Banter   PCP: [name], MD   Recommendations at discharge:   Follow with Primary Care within 1 week Repeat CBC within 1 week Follow up outpatient with Hematology. Referral sent for management of Iron Deficiency Anemia. Follow up as scheduled with your other specialists including Infectious Disease, Orthopedics  Discharge Diagnoses: Principal Problem:   Symptomatic anemia Active Problems:   Iron deficiency anemia   HTN (hypertension)   Hyperlipidemia   CAD (coronary artery disease)   Type II diabetes mellitus with renal manifestations (HCC)   COPD (chronic obstructive pulmonary disease) (HCC)   Acute renal failure superimposed on stage 3b chronic kidney disease (HCC)   Gout   Diarrhea   Septic joint of right shoulder region [facility])   Depression with anxiety   Alcohol abuse   Tobacco abuse  Resolved Problems:   * No resolved hospital problems. [facility] Course:  HPI on admission 08/25/23:  "Edward Singleton is a 72 y.o. 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 ..." See H&P for full HPI on admission and ED course.   Pt admitted with acute on chronic anemia with Hbg initially 6.0. Transfused 2 units pRBC's on admission. Gastroenterology consulted for evaluation.   Further hospital course and management as outlined below.  11/17 -- pt doing well, has not complaints, asking to go home.  Hbg has remained stable and patient has shown no signs of bleeding since admission.  GI recommended no evaluation, attribute his severe anemia to severe chronic iron  deficiency.  Pt is medically stable and requesting for discharge home today.  PT evaluation was ordered, but pt declined, citing his mobility at baseline and felt not needed.     Assessment and Plan:  Symptomatic anemia  Severe iron deficiency anemia:  Hgb 9.4 in Jan --> 6.0 on admission.   No clear history of bloody, maroon or dark/tarry stools or hematochezia, no hematemesis or hematuria. S/p Transfusion 2 units pRBC's on admission 11/15 Hbg trend 6 >> 7.2 >> 7.3 >> 9.0 >> 8.7 >> 8.7 stable --GI, Dr. Norma Fredrickson consulted - see consult note.  No endoscopic evaluation felt indicated.  --Diet resumed per GI on 11/16 - tolerating --Referral sent to Hematology outpatient for ongoing management of iron deficiency    HTN (hypertension) --Resume home metoprolol at d/c   Hyperlipidemia --Crestor   CAD (coronary artery disease) --Per med history, not on aspirin --Continue Crestor   Type II diabetes mellitus with renal manifestations (HCC):  Recent A1c 5.8, well-controlled.   Resume metformin at d/c Covered with sliding scale insulin during admission   COPD: Stable --Bronchodilators and as needed Mucinex   Acute renal failure superimposed on CKD stage 3b  Recent baseline creatinine 1.20 on 1/60/24.   On admission, Cr 1.71 Likely due to dehydration secondary to diarrhea --Improved with IV fluids  --Repeat BMP at follow up   Gout - stable --Allopurinol   Diarrhea - resolved.   --C diff test was not sent as diarrhea resolved.  Septic joint of right shoulder region [facility]): This is chronic issue. --Continue home Duricef --Follow up as scheduled with  ID and Ortho   Depression with anxiety --Continue home medications   Alcohol abuse -- no withdrawal symptoms occurred --CIWA protocol -- scores remained zero   Tobacco abuse --Nicotine patch       Consultants: GI Procedures performed: None  Disposition: Home Diet recommendation:  Cardiac diet DISCHARGE  MEDICATION: Allergies as of 08/27/2023   No Known Allergies      Medication List     STOP taking these medications    amLODipine 5 MG tablet Commonly known as: NORVASC       TAKE these medications    allopurinol 300 MG tablet Commonly known as: ZYLOPRIM Take 150 mg by mouth daily.   cefadroxil 500 MG capsule Commonly known as: DURICEF Take 250 mg by mouth 2 (two) times daily.   escitalopram 10 MG tablet Commonly known as: LEXAPRO Take 10 mg by mouth daily.   folic acid 1 MG tablet Commonly known as: FOLVITE Take 1 mg by mouth daily.   metFORMIN 500 MG tablet Commonly known as: GLUCOPHAGE Take 500 mg by mouth 2 (two) times daily.   metoprolol succinate 50 MG 24 hr tablet Commonly known as: TOPROL-XL Take 0.5 tablets by mouth daily.   rosuvastatin 40 MG tablet Commonly known as: CRESTOR Take 20 mg by mouth daily.   thiamine 100 MG tablet Commonly known as: Vitamin B-1 Take 100 mg by mouth daily.   traZODone 50 MG tablet Commonly known as: DESYREL Take 1 tablet by mouth at bedtime.        Discharge Exam: Filed Weights   08/25/23 2239  Weight: 62.3 kg   General exam: awake, alert, no acute distress HEENT: atraumatic, clear conjunctiva, anicteric sclera, moist mucus membranes, hearing grossly normal  Respiratory system: CTAB, no wheezes, rales or rhonchi, normal respiratory effort. Cardiovascular system: normal S1/S2, RRR, no pedal edema.   Gastrointestinal system: soft, NT, ND Central nervous system: A&O x 3. no gross focal neurologic deficits, normal speech Extremities: moves all, no edema, normal tone Skin: dry, intact, normal temperature Psychiatry: normal mood, congruent affect, judgement and insight appear normal   Condition at discharge: stable  The results of significant diagnostics from this hospitalization (including imaging, microbiology, ancillary and laboratory) are listed below for reference.   Imaging Studies: No results  found.  Microbiology: Results for orders placed or performed during the hospital encounter of 10/11/22  Resp panel by RT-PCR (RSV, Flu A&B, Covid) Anterior Nasal Swab     Status: Abnormal   Collection Time: 10/10/22  7:15 PM   Specimen: Anterior Nasal Swab  Result Value Ref Range Status   SARS Coronavirus 2 by RT PCR POSITIVE (A) NEGATIVE Final    Comment: (NOTE) SARS-CoV-2 target nucleic acids are DETECTED.  The SARS-CoV-2 RNA is generally detectable in upper respiratory specimens during the acute phase of infection. Positive results are indicative of the presence of the identified virus, but do not rule out bacterial infection or co-infection with other pathogens not detected by the test. Clinical correlation with patient history and other diagnostic information is necessary to determine patient infection status. The expected result is Negative.  Fact Sheet for Patients: BloggerCourse.com  Fact Sheet for Healthcare Providers: SeriousBroker.it  This test is not yet approved or cleared by the [place] FDA and  has been authorized for detection and/or diagnosis of SARS-CoV-2 by FDA under an Emergency Use Authorization (EUA).  This EUA will remain in effect (meaning this test can be used) for the duration of  the COVID-19 declaration under  Section 564(b)(1) of the A ct, 21 U.S.C. section 360bbb-3(b)(1), unless the authorization is terminated or revoked sooner.     Influenza A by PCR NEGATIVE NEGATIVE Final   Influenza B by PCR NEGATIVE NEGATIVE Final    Comment: (NOTE) The Xpert Xpress SARS-CoV-2/FLU/RSV plus assay is intended as an aid in the diagnosis of influenza from Nasopharyngeal swab specimens and should not be used as a sole basis for treatment. Nasal washings and aspirates are unacceptable for Xpert Xpress SARS-CoV-2/FLU/RSV testing.  Fact Sheet for Patients: BloggerCourse.com  Fact  Sheet for Healthcare Providers: SeriousBroker.it  This test is not yet approved or cleared by the [place] FDA and has been authorized for detection and/or diagnosis of SARS-CoV-2 by FDA under an Emergency Use Authorization (EUA). This EUA will remain in effect (meaning this test can be used) for the duration of the COVID-19 declaration under Section 564(b)(1) of the Act, 21 U.S.C. section 360bbb-3(b)(1), unless the authorization is terminated or revoked.     Resp Syncytial Virus by PCR NEGATIVE NEGATIVE Final    Comment: (NOTE) Fact Sheet for Patients: BloggerCourse.com  Fact Sheet for Healthcare Providers: SeriousBroker.it  This test is not yet approved or cleared by the [place] FDA and has been authorized for detection and/or diagnosis of SARS-CoV-2 by FDA under an Emergency Use Authorization (EUA). This EUA will remain in effect (meaning this test can be used) for the duration of the COVID-19 declaration under Section 564(b)(1) of the Act, 21 U.S.C. section 360bbb-3(b)(1), unless the authorization is terminated or revoked.  Performed at [facility], [address] [place], [place] [address]   Culture, blood (Routine X 2) w Reflex to ID Panel     Status: None   Collection Time: 10/11/22  2:40 AM   Specimen: BLOOD RIGHT ARM  Result Value Ref Range Status   Specimen Description BLOOD RIGHT ARM  Final   Special Requests   Final    BOTTLES DRAWN AEROBIC AND ANAEROBIC Blood Culture adequate volume   Culture   Final    NO GROWTH 5 DAYS Performed at [facility], [address] [place], [place] [address]    Report Status 10/16/2022 FINAL  Final  Culture, blood (Routine X 2) w Reflex to ID Panel     Status: None   Collection Time: 10/11/22  3:15 AM   Specimen: BLOOD LEFT ARM  Result Value Ref Range Status   Specimen Description BLOOD LEFT ARM  Final   Special Requests    Final    BOTTLES DRAWN AEROBIC AND ANAEROBIC Blood Culture results may not be optimal due to an inadequate volume of blood received in culture bottles   Culture   Final    NO GROWTH 5 DAYS Performed at [facility], [address] [place], [place] [address]    Report Status 10/16/2022 FINAL  Final  Respiratory (~20 pathogens) panel by PCR     Status: None   Collection Time: 10/11/22  5:50 AM  Result Value Ref Range Status   Adenovirus NOT DETECTED NOT DETECTED Final   Coronavirus 229E NOT DETECTED NOT DETECTED Final    Comment: (NOTE) The Coronavirus on the Respiratory Panel, DOES NOT test for the novel  Coronavirus (2019 nCoV)    Coronavirus HKU1 NOT DETECTED NOT DETECTED Final   Coronavirus NL63 NOT DETECTED NOT DETECTED Final   Coronavirus OC43 NOT DETECTED NOT DETECTED Final   Metapneumovirus NOT DETECTED NOT DETECTED Final   Rhinovirus / Enterovirus NOT DETECTED NOT  DETECTED Final   Influenza A NOT DETECTED NOT DETECTED Final   Influenza B NOT DETECTED NOT DETECTED Final   Parainfluenza Virus 1 NOT DETECTED NOT DETECTED Final   Parainfluenza Virus 2 NOT DETECTED NOT DETECTED Final   Parainfluenza Virus 3 NOT DETECTED NOT DETECTED Final   Parainfluenza Virus 4 NOT DETECTED NOT DETECTED Final   Respiratory Syncytial Virus NOT DETECTED NOT DETECTED Final   Bordetella pertussis NOT DETECTED NOT DETECTED Final   Bordetella Parapertussis NOT DETECTED NOT DETECTED Final   Chlamydophila pneumoniae NOT DETECTED NOT DETECTED Final   Mycoplasma pneumoniae NOT DETECTED NOT DETECTED Final    Comment: Performed at [facility], [address], [place], [place] [address]    Labs: CBC: Recent Labs  Lab 08/25/23 2136 08/26/23 0153 08/26/23 0813 08/26/23 1409 08/27/23 0559  WBC 10.0 11.5* 8.8 8.5 7.3  HGB 7.2* 7.3* 9.0* 8.7* 8.7*  HCT 22.9* 22.2* 27.9* 26.4* 25.8*  MCV 90.9 87.4 90.3 89.8 87.8  PLT 390 382 393 384 403*   Basic Metabolic Panel: Recent Labs   Lab 08/25/23 1754 08/26/23 0813 08/27/23 0559  NA 131* 135 133*  K 4.8 3.9 3.8  CL 99 101 100  CO2 20* 25 24  GLUCOSE 98 91 81  BUN 15 14 13   CREATININE 1.71* 1.61* 1.69*  CALCIUM 9.2 9.0 9.0  MG  --   --  2.4   Liver Function Tests: Recent Labs  Lab 08/25/23 1754  AST 22  ALT 13  ALKPHOS 79  BILITOT 0.5  PROT 6.5  ALBUMIN 3.8   CBG: Recent Labs  Lab 08/26/23 1138 08/26/23 1710 08/26/23 2109 08/27/23 0808 08/27/23 1213  GLUCAP 97 86 78 87 78    Discharge time spent: less than 30 minutes.  Signed: Pennie Banter, DO [facility] 08/27/2023

## 2023-08-27 NOTE — Progress Notes (Signed)
Discharge instructions, RX's and follow up appts explained and provided to patient and sister verbalized understanding. Patient left floor via wheelchair accompanied by staff no c/o pain or shortness of breath at d/c.  Amiley Shishido, Kae Heller, RN

## 2023-08-27 NOTE — Plan of Care (Signed)
  Problem: Education: Goal: Ability to describe self-care measures that may prevent or decrease complications (Diabetes Survival Skills Education) will improve Outcome: Progressing   

## 2023-08-27 NOTE — Plan of Care (Signed)

## 2023-08-27 NOTE — Plan of Care (Signed)
  Problem: Education: Goal: Ability to describe self-care measures that may prevent or decrease complications (Diabetes Survival Skills Education) will improve 08/27/2023 1144 by Genevie Ann, RN Outcome: Progressing 08/27/2023 1144 by Genevie Ann, RN Outcome: Progressing Goal: Individualized Educational Video(s) Outcome: Progressing

## 2023-11-30 ENCOUNTER — Ambulatory Visit: Payer: No Typology Code available for payment source | Admitting: Gastroenterology

## 2023-11-30 ENCOUNTER — Telehealth: Payer: Self-pay | Admitting: Gastroenterology

## 2023-11-30 NOTE — Telephone Encounter (Signed)
The patient sister Noreene Larsson) called in and left a voicemail requesting to reschedule her brother appointment. She said that they have a major family emergency.  I called her back to let her know that we got her message, and he is rescheduled for 12/05/23 at 2:30 pm.

## 2023-12-05 ENCOUNTER — Other Ambulatory Visit: Payer: Self-pay | Admitting: *Deleted

## 2023-12-05 ENCOUNTER — Encounter: Payer: Self-pay | Admitting: Gastroenterology

## 2023-12-05 ENCOUNTER — Ambulatory Visit (INDEPENDENT_AMBULATORY_CARE_PROVIDER_SITE_OTHER): Payer: No Typology Code available for payment source | Admitting: Gastroenterology

## 2023-12-05 VITALS — BP 116/67 | HR 64 | Temp 98.2°F | Ht 67.5 in | Wt 145.1 lb

## 2023-12-05 DIAGNOSIS — D509 Iron deficiency anemia, unspecified: Secondary | ICD-10-CM

## 2023-12-05 DIAGNOSIS — D508 Other iron deficiency anemias: Secondary | ICD-10-CM

## 2023-12-05 NOTE — Progress Notes (Unsigned)
 [name] MD, MRCP(U.K) [address]  [address]  [place], [place] [address]  Main: [phone]  Fax: [phone]   Primary Care Physician: [name], MD  Primary Gastroenterologist:  Dr. [name]   Chief Complaint  Patient presents with   Establish Care    HPI: Edward Singleton is a 73 y.o. male   Summary of history :  Initially seen back in 06/29/2022 for a colonoscopy.He underwent arthroplasty in January 2023 to have his implants removed as he developed Streptococcus bacteremia. Treated with IV antibiotics. He has been referred to me to undergo a colonoscopy due to unintentional weight loss and rule out a mass in the colon that might have precipitated bacteremia. He is today here with his sister with his healthcare power of attorney holder. The patient denies any symptoms. Denies any change in bowel habits no constipation no diarrhea no abdominal pain. Has lost weight but he blames it to the nature of the food that he was served at the facility he was which was not appetizing.   Interval history 06/20/2022-12/05/2023   07/06/2022: Colonoscopy: 7 mm polyp in the cecum resected diverticulosis of the sigmoid colon.  Upper endoscopy also performed on same day: Medium sized hiatal hernia.  The polyp was a tubular adenoma In January 2025 was found to have a hemoglobin of 6 g sent to the emergency room received PRBC transfusion was recommended to have EGD and colonoscopy but patient refused and was discharge from the hospital.  Received IV iron.  He denies any overt blood loss denies any nasal bleeds blood in the urine blood in the stool.  Has symptoms of reflux when he lays flat.  His medical record does not show he is on a PPI.  Denies any other symptoms.   Current Outpatient Medications  Medication Sig Dispense Refill   allopurinol (ZYLOPRIM) 300 MG tablet Take 150 mg by mouth daily.     cefadroxil (DURICEF) 500 MG capsule Take 250 mg by mouth 2 (two) times daily.      escitalopram (LEXAPRO) 10 MG tablet Take 10 mg by mouth daily.     folic acid (FOLVITE) 1 MG tablet Take 1 mg by mouth daily.     metFORMIN (GLUCOPHAGE) 500 MG tablet Take 500 mg by mouth 2 (two) times daily.     metoprolol succinate (TOPROL-XL) 50 MG 24 hr tablet Take 0.5 tablets by mouth daily.     rosuvastatin (CRESTOR) 40 MG tablet Take 20 mg by mouth daily.     thiamine (VITAMIN B-1) 100 MG tablet Take 100 mg by mouth daily.     traZODone (DESYREL) 50 MG tablet Take 1 tablet by mouth at bedtime.     No current facility-administered medications for this visit.    Allergies as of 12/05/2023   (No Known Allergies)      ROS:  General: Negative for anorexia, weight loss, fever, chills, fatigue, weakness. ENT: Negative for hoarseness, difficulty swallowing , nasal congestion. CV: Negative for chest pain, angina, palpitations, dyspnea on exertion, peripheral edema.  Respiratory: Negative for dyspnea at rest, dyspnea on exertion, cough, sputum, wheezing.  GI: See history of present illness. GU:  Negative for dysuria, hematuria, urinary incontinence, urinary frequency, nocturnal urination.  Endo: Negative for unusual weight change.    Physical Examination:   BP 116/67   Pulse 64   Temp 98.2 F (36.8 C) (Oral)   Ht 5' 7.5" (1.715 m)   Wt 145 lb 2 oz (65.8 kg)  BMI 22.39 kg/m   General: Well-nourished, well-developed in no acute distress.  Eyes: No icterus. Conjunctivae pink. Mouth: Oropharyngeal mucosa moist and pink , no lesions erythema or exudate. Lungs: Clear to auscultation bilaterally. Non-labored. Extremities: No lower extremity edema. No clubbing or deformities. Neuro: Alert and oriented x 3.  Grossly intact. Skin: Warm and dry, no jaundice.   Psych: Alert and cooperative, normal mood and affect.   Imaging Studies: No results found.  Assessment and Plan:   Edward Singleton is a 73 y.o. y/o male recently seen at the [facility] found to have iron deficiency  anemia was recommended an EGD and colonoscopy as he had a hemoglobin of 6 g patient refused and here to establish care to obtain the same.  It is very possible that the large hiatal hernia that he has had in the past could be contributing to the anemia.  This was seen on his endoscopy that I performed previously in 2023  Plan 1.  CBC, iron studies, B12 folate, H. pylori breath test, celiac serology, urine analysis 2.  EGD and colonoscopy and if negative capsule study of the small bowel 3.  Continue IV iron at the [place] 4.  Commence on a PPI Prilosec 40 mg once a day for possible reflux related esophagitis and bleeding   I have discussed alternative options, risks & benefits,  which include, but are not limited to, bleeding, infection, perforation,respiratory complication & drug reaction.  The patient agrees with this plan & written consent will be obtained.       Dr [name]  MD,MRCP [facility]) Follow up in 6 to 8 weeks after procedures

## 2023-12-06 MED ORDER — OMEPRAZOLE 40 MG PO CPDR: 40.0000 mg | DELAYED_RELEASE_CAPSULE | Freq: Every day | ORAL | 2 refills | Status: DC

## 2023-12-06 MED ORDER — PEG 3350-KCL-NABCB-NACL-NASULF 236 G PO SOLR: 4000.0000 mL | Freq: Once | ORAL | 0 refills | Status: AC

## 2023-12-08 LAB — URINALYSIS
Bilirubin, UA: NEGATIVE
Glucose, UA: NEGATIVE
Leukocytes,UA: NEGATIVE
Nitrite, UA: NEGATIVE
RBC, UA: NEGATIVE
Specific Gravity, UA: 1.024 (ref 1.005–1.030)
Urobilinogen, Ur: 1 mg/dL (ref 0.2–1.0)
pH, UA: 7 (ref 5.0–7.5)

## 2023-12-08 LAB — CELIAC DISEASE AB SCREEN W/RFX
Antigliadin Abs, IgA: 2 U (ref 0–19)
IgA/Immunoglobulin A, Serum: 86 mg/dL (ref 61–437)

## 2023-12-08 LAB — CBC WITH DIFFERENTIAL/PLATELET
Basophils Absolute: 0.1 10*3/uL (ref 0.0–0.2)
Basos: 1 %
EOS (ABSOLUTE): 0.2 10*3/uL (ref 0.0–0.4)
Eos: 2 %
Hematocrit: 38.4 % (ref 37.5–51.0)
Hemoglobin: 12.5 g/dL — ABNORMAL LOW (ref 13.0–17.7)
Immature Grans (Abs): 0 10*3/uL (ref 0.0–0.1)
Immature Granulocytes: 0 %
Lymphocytes Absolute: 2.5 10*3/uL (ref 0.7–3.1)
Lymphs: 25 %
MCH: 32.3 pg (ref 26.6–33.0)
MCHC: 32.6 g/dL (ref 31.5–35.7)
MCV: 99 fL — ABNORMAL HIGH (ref 79–97)
Monocytes Absolute: 1.1 10*3/uL — ABNORMAL HIGH (ref 0.1–0.9)
Monocytes: 11 %
Neutrophils Absolute: 6.1 10*3/uL (ref 1.4–7.0)
Neutrophils: 61 %
Platelets: 350 10*3/uL (ref 150–450)
RBC: 3.87 x10E6/uL — ABNORMAL LOW (ref 4.14–5.80)
RDW: 21 % — ABNORMAL HIGH (ref 11.6–15.4)
WBC: 10.1 10*3/uL (ref 3.4–10.8)

## 2023-12-08 LAB — IRON,TIBC AND FERRITIN PANEL
Ferritin: 165 ng/mL (ref 30–400)
Iron Saturation: 33 % (ref 15–55)
Iron: 98 ug/dL (ref 38–169)
Total Iron Binding Capacity: 295 ug/dL (ref 250–450)
UIBC: 197 ug/dL (ref 111–343)

## 2023-12-08 LAB — B12 AND FOLATE PANEL
Folate: >20 ng/mL (ref >3.0)
Vitamin B-12: 1892 pg/mL — ABNORMAL HIGH (ref 232–1245)

## 2023-12-16 LAB — H. PYLORI BREATH TEST: H pylori Breath Test: NEGATIVE

## 2023-12-27 ENCOUNTER — Telehealth: Payer: Self-pay

## 2023-12-27 NOTE — Telephone Encounter (Signed)
-----   Message from [name] sent at 12/25/2023 10:22 AM EDT ----- Labs are much improved.  Iron studies have normalized

## 2023-12-27 NOTE — Telephone Encounter (Signed)
 Called patient to let him know the above information and was glad to hear.

## 2024-01-09 ENCOUNTER — Encounter: Payer: Self-pay | Admitting: Gastroenterology

## 2024-01-10 ENCOUNTER — Ambulatory Visit
Admission: RE | Admit: 2024-01-10 | Discharge: 2024-01-10 | Disposition: A | Discharge status: Home / Self Care | Payer: No Typology Code available for payment source | Attending: Gastroenterology | Admitting: Gastroenterology

## 2024-01-10 ENCOUNTER — Ambulatory Visit: Admitting: Anesthesiology

## 2024-01-10 ENCOUNTER — Encounter: Payer: Self-pay | Admitting: Gastroenterology

## 2024-01-10 ENCOUNTER — Encounter
Admission: RE | Disposition: A | Discharge status: Home / Self Care | Payer: Self-pay | Source: Home / Self Care | Attending: Gastroenterology

## 2024-01-10 ENCOUNTER — Other Ambulatory Visit: Payer: Self-pay

## 2024-01-10 DIAGNOSIS — E1151 Type 2 diabetes mellitus with diabetic peripheral angiopathy without gangrene: Secondary | ICD-10-CM | POA: Diagnosis not present

## 2024-01-10 DIAGNOSIS — Z7984 Long term (current) use of oral hypoglycemic drugs: Secondary | ICD-10-CM | POA: Diagnosis not present

## 2024-01-10 DIAGNOSIS — F17210 Nicotine dependence, cigarettes, uncomplicated: Secondary | ICD-10-CM | POA: Insufficient documentation

## 2024-01-10 DIAGNOSIS — F419 Anxiety disorder, unspecified: Secondary | ICD-10-CM | POA: Diagnosis not present

## 2024-01-10 DIAGNOSIS — I10 Essential (primary) hypertension: Secondary | ICD-10-CM | POA: Insufficient documentation

## 2024-01-10 DIAGNOSIS — Z833 Family history of diabetes mellitus: Secondary | ICD-10-CM | POA: Diagnosis not present

## 2024-01-10 DIAGNOSIS — R519 Headache, unspecified: Secondary | ICD-10-CM | POA: Insufficient documentation

## 2024-01-10 DIAGNOSIS — Z79899 Other long term (current) drug therapy: Secondary | ICD-10-CM | POA: Insufficient documentation

## 2024-01-10 DIAGNOSIS — K449 Diaphragmatic hernia without obstruction or gangrene: Secondary | ICD-10-CM | POA: Diagnosis not present

## 2024-01-10 DIAGNOSIS — F32A Depression, unspecified: Secondary | ICD-10-CM | POA: Insufficient documentation

## 2024-01-10 DIAGNOSIS — I2510 Atherosclerotic heart disease of native coronary artery without angina pectoris: Secondary | ICD-10-CM | POA: Diagnosis not present

## 2024-01-10 DIAGNOSIS — K5730 Diverticulosis of large intestine without perforation or abscess without bleeding: Secondary | ICD-10-CM | POA: Diagnosis not present

## 2024-01-10 DIAGNOSIS — K529 Noninfective gastroenteritis and colitis, unspecified: Secondary | ICD-10-CM | POA: Diagnosis not present

## 2024-01-10 DIAGNOSIS — K209 Esophagitis, unspecified without bleeding: Secondary | ICD-10-CM | POA: Insufficient documentation

## 2024-01-10 DIAGNOSIS — J449 Chronic obstructive pulmonary disease, unspecified: Secondary | ICD-10-CM | POA: Insufficient documentation

## 2024-01-10 DIAGNOSIS — D509 Iron deficiency anemia, unspecified: Secondary | ICD-10-CM | POA: Diagnosis present

## 2024-01-10 DIAGNOSIS — D508 Other iron deficiency anemias: Secondary | ICD-10-CM

## 2024-01-10 HISTORY — PX: COLONOSCOPY WITH PROPOFOL: SHX5780

## 2024-01-10 HISTORY — PX: ESOPHAGOGASTRODUODENOSCOPY (EGD) WITH PROPOFOL: SHX5813

## 2024-01-10 LAB — GLUCOSE, CAPILLARY: Glucose-Capillary: 108 mg/dL — ABNORMAL HIGH (ref 70–99)

## 2024-01-10 SURGERY — ESOPHAGOGASTRODUODENOSCOPY (EGD) WITH PROPOFOL
Anesthesia: General

## 2024-01-10 MED ORDER — PROPOFOL 10 MG/ML IV BOLUS
INTRAVENOUS | Status: DC | PRN
  Administered 2024-01-10: 40 mg via INTRAVENOUS
  Administered 2024-01-10: 60 mg via INTRAVENOUS
  Administered 2024-01-10: 40 mg via INTRAVENOUS

## 2024-01-10 MED ORDER — GLYCOPYRROLATE 0.2 MG/ML IJ SOLN
INTRAMUSCULAR | Status: DC | PRN
Start: 2024-01-10 — End: 2024-01-10
  Administered 2024-01-10: .1 mg via INTRAVENOUS

## 2024-01-10 MED ORDER — SODIUM CHLORIDE 0.9 % IV SOLN
INTRAVENOUS | Status: DC
Start: 2024-01-10 — End: 2024-01-10

## 2024-01-10 MED ORDER — PHENYLEPHRINE 80 MCG/ML (10ML) SYRINGE FOR IV PUSH (FOR BLOOD PRESSURE SUPPORT)
PREFILLED_SYRINGE | INTRAVENOUS | Status: DC | PRN
  Administered 2024-01-10: 40 ug via INTRAVENOUS
  Administered 2024-01-10: 80 ug via INTRAVENOUS

## 2024-01-10 MED ORDER — PROPOFOL 500 MG/50ML IV EMUL
INTRAVENOUS | Status: DC | PRN
  Administered 2024-01-10: 100 ug/kg/min via INTRAVENOUS

## 2024-01-10 MED ORDER — LIDOCAINE HCL (PF) 2 % IJ SOLN
INTRAMUSCULAR | Status: AC
  Filled 2024-01-10: qty 5

## 2024-01-10 MED ORDER — ONDANSETRON HCL 4 MG/2ML IJ SOLN
INTRAMUSCULAR | Status: DC | PRN
  Administered 2024-01-10: 4 mg via INTRAVENOUS

## 2024-01-10 MED ORDER — OMEPRAZOLE 40 MG PO CPDR: 40.0000 mg | DELAYED_RELEASE_CAPSULE | Freq: Two times a day (BID) | ORAL | 1 refills | Status: DC

## 2024-01-10 MED ORDER — ONDANSETRON HCL 4 MG/2ML IJ SOLN
INTRAMUSCULAR | Status: AC
  Filled 2024-01-10: qty 2

## 2024-01-10 MED ORDER — PROPOFOL 10 MG/ML IV BOLUS
INTRAVENOUS | Status: AC
  Filled 2024-01-10: qty 40

## 2024-01-10 MED ORDER — LIDOCAINE HCL (CARDIAC) PF 100 MG/5ML IV SOSY
PREFILLED_SYRINGE | INTRAVENOUS | Status: DC | PRN
Start: 2024-01-10 — End: 2024-01-10
  Administered 2024-01-10: 50 mg via INTRAVENOUS

## 2024-01-10 MED ORDER — PHENYLEPHRINE 80 MCG/ML (10ML) SYRINGE FOR IV PUSH (FOR BLOOD PRESSURE SUPPORT)
PREFILLED_SYRINGE | INTRAVENOUS | Status: AC
  Filled 2024-01-10: qty 10

## 2024-01-10 NOTE — Op Note (Signed)
 [facility] Gastroenterology Patient Name: Edward Singleton Procedure Date: 01/10/2024 9:05 AM MRN: [id number] Account #: [id number] Date of Birth: 05/30/1951 Admit Type: Outpatient Age: 73 Room: [facility] ENDO ROOM 3 Gender: Male Note Status: Finalized Instrument Name: Upper Endoscope [id number] Procedure:             Upper GI endoscopy Indications:           Iron deficiency anemia Providers:             [name] MD, MD Referring MD:          [name] (Referring MD) Medicines:             Monitored Anesthesia Care Complications:         No immediate complications. Procedure:             Pre-Anesthesia Assessment:                        - Prior to the procedure, a History and Physical was                         performed, and patient medications, allergies and                         sensitivities were reviewed. The patient's tolerance                         of previous anesthesia was reviewed.                        - The risks and benefits of the procedure and the                         sedation options and risks were discussed with the                         patient. All questions were answered and informed                         consent was obtained.                        - ASA Grade Assessment: II - A patient with mild                         systemic disease.                        After obtaining informed consent, the endoscope was                         passed under direct vision. Throughout the procedure,                         the patient's blood pressure, pulse, and oxygen                         saturations were monitored continuously. The                         Endosonoscope was introduced  through the mouth, and                         advanced to the third part of duodenum. The upper GI                         endoscopy was accomplished with ease. The patient                         tolerated the procedure well. Findings:      The examined  duodenum was normal.      A large hiatal hernia was present.      LA Grade D (one or more mucosal breaks involving at least 75% of       esophageal circumference) esophagitis with no bleeding was found in the       lower third of the esophagus.      The cardia and gastric fundus were normal on retroflexion. Impression:            - Normal examined duodenum.                        - Large hiatal hernia.                        - LA Grade D esophagitis with no bleeding.                        - No specimens collected. Recommendation:        - Use Prilosec (omeprazole) 40 mg PO BID for 4 months.                        - Repeat upper endoscopy in 10 weeks to evaluate the                         response to therapy.                        - Perform a colonoscopy today. Procedure Code(s):     --- Professional ---                        [id number], Esophagogastroduodenoscopy, flexible,                         transoral; diagnostic, including collection of                         specimen(s) by brushing or washing, when performed                         (separate procedure) Diagnosis Code(s):     --- Professional ---                        K44.9, Diaphragmatic hernia without obstruction or                         gangrene                        K20.90, Esophagitis, unspecified without bleeding  D50.9, Iron deficiency anemia, unspecified CPT copyright 2022 American Medical Association. All rights reserved. The codes documented in this report are preliminary and upon coder review may  be revised to meet current compliance requirements. [name], MD [name] MD, MD 01/10/2024 9:26:40 AM This report has been signed electronically. Number of Addenda: 0 Note Initiated On: 01/10/2024 9:05 AM Estimated Blood Loss:  Estimated blood loss: none.      [facility]

## 2024-01-10 NOTE — H&P (Signed)
 [name], MD [address], [place], [place], [address] [address], [address], [place], [place], [address] Phone: [phone]  Fax: [phone]  Primary Care Physician:  [name], MD   Pre-Procedure History & Physical: HPI:  Edward Singleton is a 73 y.o. 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   Diabetes mellitus without complication (HCC)    Gout    Gout    Headache    Hypercholesteremia    Hypertension    Kidney stones    Left flank pain, chronic    Pelvic fracture (HCC) 11/17/2014   Pneumonia    Prolonged QT syndrome    Septic arthritis of shoulder, right (HCC) 05/11/2022   White matter disease     Past Surgical History:  Procedure Laterality Date   COLONOSCOPY WITH PROPOFOL N/A 07/06/2022   Procedure: COLONOSCOPY WITH PROPOFOL;  Surgeon: [name], MD;  Location: [facility] ENDOSCOPY;  Service: Gastroenterology;  Laterality: N/A;   ESOPHAGOGASTRODUODENOSCOPY Left 03/18/2019   Procedure: ESOPHAGOGASTRODUODENOSCOPY (EGD);  Surgeon: [name], MD;  Location: [facility] ENDOSCOPY;  Service: Gastroenterology;  Laterality: Left;   ESOPHAGOGASTRODUODENOSCOPY N/A 07/06/2022   Procedure: ESOPHAGOGASTRODUODENOSCOPY (EGD);  Surgeon: [name], MD;  Location: [facility] ENDOSCOPY;  Service: Gastroenterology;  Laterality: N/A;   FRACTURE SURGERY     left renal stent placement  10/11/2011   LITHOTRIPSY     ORIF HUMERUS FRACTURE Left 11/18/2014   Procedure: OPEN REDUCTION INTERNAL FIXATION (ORIF) DISTAL HUMERUS FRACTURE;  Surgeon: Budd Palmer, MD;  Location: [facility] OR;  Service: Orthopedics;  Laterality: Left;   REVERSE SHOULDER ARTHROPLASTY Right 10/27/2021   Procedure: REVERSE SHOULDER ARTHROPLASTY;  Surgeon: Bjorn Pippin, MD;  Location: [facility] OR;  Service: Orthopedics;  Laterality: Right;    REVISION TOTAL SHOULDER TO REVERSE TOTAL SHOULDER Right 05/11/2022   Procedure: REVISION TOTAL SHOULDER TO REVERSE TOTAL SHOULDER;  Surgeon: Bjorn Pippin, MD;  Location: [facility] ORS;  Service: Orthopedics;  Laterality: Right;   SPLENECTOMY      Prior to Admission medications   Medication Sig Start Date End Date Taking? Authorizing Provider  allopurinol (ZYLOPRIM) 300 MG tablet Take 150 mg by mouth daily. 07/11/22   [provider]  cefadroxil (DURICEF) 500 MG capsule Take 250 mg by mouth 2 (two) times daily. 08/12/22   [provider]  escitalopram (LEXAPRO) 10 MG tablet Take 10 mg by mouth daily. 07/11/22   [provider]  folic acid (FOLVITE) 1 MG tablet Take 1 mg by mouth daily.    [provider]  metFORMIN (GLUCOPHAGE) 500 MG tablet Take 500 mg by mouth 2 (two) times daily. 06/04/19   [provider]  metoprolol succinate (TOPROL-XL) 50 MG 24 hr tablet Take 0.5 tablets by mouth daily. 07/11/22   [provider]  omeprazole (PRILOSEC) 40 MG capsule Take 1 capsule (40 mg total) by mouth daily. 12/06/23   [name], MD  rosuvastatin (CRESTOR) 40 MG tablet Take 20 mg by mouth daily. 07/11/22   [provider]  thiamine (VITAMIN B-1) 100 MG tablet Take 100 mg by mouth daily.    [provider]  traZODone (DESYREL) 50 MG tablet Take 1 tablet by mouth at bedtime. 07/11/22   [provider]  Allergies as of 12/06/2023   (No Known Allergies)    Family History  Problem Relation Age of Onset   Heart disease Mother    Heart disease Father    Diabetes Father    Nephrolithiasis Paternal Grandfather    Kidney disease Neg Hx    Prostate cancer Neg Hx     Social History   Socioeconomic History   Marital status: Single    Spouse name: Not on file   Number of children: Not on file   Years of education: Not on file   Highest education level: Not on file  Occupational History   Not on file  Tobacco Use   Smoking  status: Every Day    Current packs/day: 1.00    Average packs/day: 1 pack/day for 50.0 years (50.0 ttl pk-yrs)    Types: Cigarettes   Smokeless tobacco: Former    Types: Engineer, drilling   Vaping status: Never Used  Substance and Sexual Activity   Alcohol use: Not Currently    Alcohol/week: 42.0 standard drinks of alcohol    Types: 42 Cans of beer per week    Comment: quit june 4th   Drug use: No   Sexual activity: Not on file  Other Topics Concern   Not on file  Social History Narrative   Not on file   Social Drivers of Health   Financial Resource Strain: Not on file  Food Insecurity: No Food Insecurity (08/25/2023)   Hunger Vital Sign    Worried About Running Out of Food in the Last Year: Never true    Ran Out of Food in the Last Year: Never true  Transportation Needs: No Transportation Needs (08/25/2023)   PRAPARE - Administrator, Civil Service (Medical): No    Lack of Transportation (Non-Medical): No  Physical Activity: Not on file  Stress: Not on file  Social Connections: Not on file  Intimate Partner Violence: Not At Risk (08/25/2023)   Humiliation, Afraid, Rape, and Kick questionnaire    Fear of Current or Ex-Partner: No    Emotionally Abused: No    Physically Abused: No    Sexually Abused: No    Review of Systems: See HPI, otherwise negative ROS  Physical Exam: There were no vitals taken for this visit. General:   Alert,  pleasant and cooperative in NAD Head:  Normocephalic and atraumatic. Neck:  Supple; no masses or thyromegaly. Lungs:  Clear throughout to auscultation, normal respiratory effort.    Heart:  +S1, +S2, Regular rate and rhythm, No edema. Abdomen:  Soft, nontender and nondistended. Normal bowel sounds, without guarding, and without rebound.   Neurologic:  Alert and  oriented x4;  grossly normal neurologically.  Impression/Plan: Edward Singleton is here for an endoscopy and colonoscopy  to be performed for  evaluation of iron  deficiency anemia    Risks, benefits, limitations, and alternatives regarding endoscopy have been reviewed with the patient.  Questions have been answered.  All parties agreeable.   [name], MD  01/10/2024, 8:30 AM

## 2024-01-10 NOTE — Op Note (Addendum)
 [facility] Gastroenterology Patient Name: Edward Singleton Procedure Date: 01/10/2024 9:04 AM MRN: [id number] Account #: [id number] Date of Birth: [birth year] Admit Type: Outpatient Age: 73 Room: [facility] ENDO ROOM 3 Gender: Male Note Status: Finalized Instrument Name: Prentice Docker [id number] Procedure:             Colonoscopy Indications:           Iron deficiency anemia Providers:             [name] MD, MD Referring MD:          [name] (Referring MD) Medicines:             Monitored Anesthesia Care Complications:         No immediate complications. Procedure:             Pre-Anesthesia Assessment:                        - Prior to the procedure, a History and Physical was                         performed, and patient medications, allergies and                         sensitivities were reviewed. The patient's tolerance                         of previous anesthesia was reviewed.                        - The risks and benefits of the procedure and the                         sedation options and risks were discussed with the                         patient. All questions were answered and informed                         consent was obtained.                        - ASA Grade Assessment: II - A patient with mild                         systemic disease.                        After obtaining informed consent, the colonoscope was                         passed under direct vision. Throughout the procedure,                         the patient's blood pressure, pulse, and oxygen                         saturations were monitored continuously. The                         Colonoscope was introduced through the anus  and                         advanced to the the terminal ileum. The colonoscopy                         was somewhat difficult due to significant looping.                         Successful completion of the procedure was aided by                          withdrawing the scope and replacing with the pediatric                         colonoscope. The patient tolerated the procedure well.                         The quality of the bowel preparation was good. Findings:      The perianal and digital rectal examinations were normal.      The terminal ileum appeared normal.      Patchy moderate inflammation characterized by altered vascularity,       congestion (edema), erythema and neovascularization was found in the       ascending colon. Biopsies were taken with a cold forceps for histology.      Multiple medium-mouthed diverticula were found in the sigmoid colon.      The exam was otherwise without abnormality on direct and retroflexion       views. Impression:            - The examined portion of the ileum was normal.                        - Patchy moderate inflammation was found in the                         ascending colon secondary to colitis. Biopsied.                        - Diverticulosis in the sigmoid colon.                        - The examination was otherwise normal on direct and                         retroflexion views. Recommendation:        - Discharge patient to home (with escort).                        - Resume previous diet.                        - Continue present medications.                        - Await pathology results.                        - Return to GI office as previously scheduled. Procedure Code(s):     --- Professional ---  21308, Colonoscopy, flexible; with biopsy, single or                         multiple Diagnosis Code(s):     --- Professional ---                        K52.9, Noninfective gastroenteritis and colitis,                         unspecified                        D50.9, Iron deficiency anemia, unspecified                        K57.30, Diverticulosis of large intestine without                         perforation or abscess without bleeding CPT copyright 2022  American Medical Association. All rights reserved. The codes documented in this report are preliminary and upon coder review may  be revised to meet current compliance requirements. [name], MD [name] MD, MD 01/10/2024 9:50:04 AM This report has been signed electronically. Number of Addenda: 0 Note Initiated On: 01/10/2024 9:04 AM Scope Withdrawal Time: 0 hours 7 minutes 13 seconds  Total Procedure Duration: 0 hours 17 minutes 39 seconds  Estimated Blood Loss:  Estimated blood loss: none.      [facility]

## 2024-01-10 NOTE — Anesthesia Postprocedure Evaluation (Signed)
 Anesthesia Post Note  Patient: OSHA ERRICO  Procedure(s) Performed: ESOPHAGOGASTRODUODENOSCOPY (EGD) WITH PROPOFOL COLONOSCOPY WITH PROPOFOL  Patient location during evaluation: Endoscopy Anesthesia Type: General Level of consciousness: awake and alert Pain management: pain level controlled Vital Signs Assessment: post-procedure vital signs reviewed and stable Respiratory status: spontaneous breathing, nonlabored ventilation, respiratory function stable and patient connected to nasal cannula oxygen Cardiovascular status: blood pressure returned to baseline and stable Postop Assessment: no apparent nausea or vomiting Anesthetic complications: no   No notable events documented.   Last Vitals:  Vitals:   01/10/24 1003 01/10/24 1013  BP: 104/73 121/76  Pulse: 94 84  Resp: 18 15  Temp:    SpO2: 95% 91%    Last Pain:  Vitals:   01/10/24 1013  TempSrc:   PainSc: 0-No pain                 Corinda Gubler

## 2024-01-10 NOTE — Transfer of Care (Signed)
 Immediate Anesthesia Transfer of Care Note  Patient: Edward Singleton  Procedure(s) Performed: ESOPHAGOGASTRODUODENOSCOPY (EGD) WITH PROPOFOL COLONOSCOPY WITH PROPOFOL  Patient Location: PACU  Anesthesia Type:MAC  Level of Consciousness: awake  Airway & Oxygen Therapy: Patient Spontanous Breathing  Post-op Assessment: Report given to RN and Post -op Vital signs reviewed and stable  Post vital signs: Reviewed and stable  Last Vitals:  Vitals Value Taken Time  BP 90/66 01/10/24 0953  Temp    Pulse 98 01/10/24 0953  Resp 18 01/10/24 0953  SpO2 100 % 01/10/24 0953  Vitals shown include unfiled device data.  Last Pain:  Vitals:   01/10/24 0953  TempSrc:   PainSc: 0-No pain         Complications: No notable events documented.

## 2024-01-10 NOTE — Anesthesia Preprocedure Evaluation (Signed)
 Anesthesia Evaluation  Patient identified by MRN, date of birth, ID band Patient awake    Reviewed: Allergy & Precautions, NPO status , Patient's Chart, lab work & pertinent test results  History of Anesthesia Complications Negative for: history of anesthetic complications  Airway Mallampati: II  TM Distance: >3 FB Neck ROM: Full    Dental  (+) Poor Dentition, Edentulous Upper   Pulmonary neg sleep apnea, COPD, Current Smoker and Patient abstained from smoking.   Pulmonary exam normal breath sounds clear to auscultation       Cardiovascular Exercise Tolerance: Good METShypertension, + CAD and + Peripheral Vascular Disease  (-) Past MI (-) dysrhythmias  Rhythm:Regular Rate:Normal - Systolic murmurs    Neuro/Psych  Headaches PSYCHIATRIC DISORDERS Anxiety Depression   Dementia    GI/Hepatic ,neg GERD  ,,(+)     (-) substance abuse    Endo/Other  diabetes    Renal/GU CRFRenal disease     Musculoskeletal   Abdominal   Peds  Hematology   Anesthesia Other Findings Past Medical History: No date: Alcohol abuse No date: Atrophic kidney No date: Benign enlargement of prostate No date: Coronary artery disease No date: Dementia (HCC)     Comment:  due to alcohol No date: Diabetes mellitus without complication (HCC) No date: Gout No date: Gout No date: Headache No date: Hypercholesteremia No date: Hypertension No date: Kidney stones No date: Left flank pain, chronic 11/17/2014: Pelvic fracture (HCC) No date: Pneumonia No date: Prolonged QT syndrome 05/11/2022: Septic arthritis of shoulder, right (HCC) No date: White matter disease  Reproductive/Obstetrics                             Anesthesia Physical Anesthesia Plan  ASA: 3  Anesthesia Plan: General   Post-op Pain Management: Minimal or no pain anticipated   Induction: Intravenous  PONV Risk Score and Plan: 1 and Propofol  infusion, TIVA and Ondansetron  Airway Management Planned: Nasal Cannula  Additional Equipment: None  Intra-op Plan:   Post-operative Plan:   Informed Consent: I have reviewed the patients History and Physical, chart, labs and discussed the procedure including the risks, benefits and alternatives for the proposed anesthesia with the patient or authorized representative who has indicated his/her understanding and acceptance.     Dental advisory given  Plan Discussed with: CRNA and Surgeon  Anesthesia Plan Comments: (Discussed risks of anesthesia with patient, including possibility of difficulty with spontaneous ventilation under anesthesia necessitating airway intervention, PONV, and rare risks such as cardiac or respiratory or neurological events, and allergic reactions. Discussed the role of CRNA in patient's perioperative care. Patient understands. Patient counseled on benefits of smoking cessation, and increased perioperative risks associated with continued smoking. )       Anesthesia Quick Evaluation

## 2024-01-11 ENCOUNTER — Encounter: Payer: Self-pay | Admitting: Gastroenterology

## 2024-01-11 LAB — SURGICAL PATHOLOGY

## 2024-01-19 ENCOUNTER — Telehealth: Payer: Self-pay

## 2024-01-19 NOTE — Telephone Encounter (Signed)
 Patient sister Edward Singleton would like to know biopsy result.  She states pt is supposed to have a repeat procedure  2nd week of June. She states she has not heard from our office.  If [facility] needs to scheduled another procedure please let me know so I can reach out to the [place] to get it approved.    Good morning Edward Singleton,  Veteran's sister "Edward Singleton" who is POA/ER contact, was informed this authorization stays with Tyrell Antonio (authorization start date 12/05/23, and stop date is 04/03/24.) They are not following Dr. Tobi Bastos. She is requesting someone to call her with biopsy results. Her best call back # is: [phone].  She understands if they are not ready at this time.  Her other concern was that veteran was ordered a repeat procedure 10 weeks from 01/10/24, which would be 2nd week of June. If your office decides he needs an extension, please submit RFS form & supporting documentation. You can email it to: [email or web address], In the subject line put Gastro RFS or you can fax it to: [phone].  I have updated our new RFAS form for your office to keep in the future.  Edward Singleton, Edward Singleton  [id number]  07-25-1951

## 2024-01-22 NOTE — Telephone Encounter (Signed)
 Called patient's sister-Jill and informed her what Dr. Antony Baumgartner stated below. She stated that her brother doesn't take any type of NSAIDs. However, she believes that he might of been fighting some sort of infection since he doesn't have a spleen and had also broken other parts of his body that requires him to be on daily antibiotics twice a day for the rest of his life. She also stated that her brother had not mentioned anything about having diarrhea. So at the end she stated that she agreed in getting in touch with them in 3 months and ask again how he is doing and then she would ask him if he would proceed with a capsule study but she stated that there is a huge chance that he would deny but to still call her. I told her that I would placing him in our recall list and reach out. Aleda Hurl agreed and had no further questions.

## 2024-07-19 ENCOUNTER — Other Ambulatory Visit: Payer: Self-pay

## 2024-07-19 ENCOUNTER — Emergency Department

## 2024-07-19 ENCOUNTER — Inpatient Hospital Stay
Admission: EM | Admit: 2024-07-19 | Discharge: 2024-07-25 | DRG: 682 | Disposition: A | Discharge status: Home / Self Care | Attending: Family Medicine | Admitting: Family Medicine

## 2024-07-19 DIAGNOSIS — J9601 Acute respiratory failure with hypoxia: Secondary | ICD-10-CM | POA: Diagnosis present

## 2024-07-19 DIAGNOSIS — K529 Noninfective gastroenteritis and colitis, unspecified: Secondary | ICD-10-CM

## 2024-07-19 DIAGNOSIS — Z66 Do not resuscitate: Secondary | ICD-10-CM | POA: Diagnosis present

## 2024-07-19 DIAGNOSIS — E860 Dehydration: Secondary | ICD-10-CM | POA: Diagnosis present

## 2024-07-19 DIAGNOSIS — F1027 Alcohol dependence with alcohol-induced persisting dementia: Secondary | ICD-10-CM | POA: Diagnosis present

## 2024-07-19 DIAGNOSIS — K589 Irritable bowel syndrome without diarrhea: Secondary | ICD-10-CM | POA: Diagnosis present

## 2024-07-19 DIAGNOSIS — N401 Enlarged prostate with lower urinary tract symptoms: Secondary | ICD-10-CM | POA: Diagnosis present

## 2024-07-19 DIAGNOSIS — I739 Peripheral vascular disease, unspecified: Secondary | ICD-10-CM | POA: Diagnosis not present

## 2024-07-19 DIAGNOSIS — J42 Unspecified chronic bronchitis: Secondary | ICD-10-CM

## 2024-07-19 DIAGNOSIS — D631 Anemia in chronic kidney disease: Secondary | ICD-10-CM | POA: Diagnosis present

## 2024-07-19 DIAGNOSIS — N1832 Chronic kidney disease, stage 3b: Secondary | ICD-10-CM | POA: Diagnosis present

## 2024-07-19 DIAGNOSIS — F0280 Dementia in other diseases classified elsewhere without behavioral disturbance: Secondary | ICD-10-CM | POA: Diagnosis present

## 2024-07-19 DIAGNOSIS — R296 Repeated falls: Secondary | ICD-10-CM | POA: Diagnosis present

## 2024-07-19 DIAGNOSIS — K219 Gastro-esophageal reflux disease without esophagitis: Secondary | ICD-10-CM | POA: Diagnosis present

## 2024-07-19 DIAGNOSIS — L988 Other specified disorders of the skin and subcutaneous tissue: Secondary | ICD-10-CM | POA: Diagnosis present

## 2024-07-19 DIAGNOSIS — I2510 Atherosclerotic heart disease of native coronary artery without angina pectoris: Secondary | ICD-10-CM | POA: Diagnosis present

## 2024-07-19 DIAGNOSIS — F10188 Alcohol abuse with other alcohol-induced disorder: Secondary | ICD-10-CM | POA: Diagnosis present

## 2024-07-19 DIAGNOSIS — E1122 Type 2 diabetes mellitus with diabetic chronic kidney disease: Secondary | ICD-10-CM | POA: Diagnosis present

## 2024-07-19 DIAGNOSIS — Z79899 Other long term (current) drug therapy: Secondary | ICD-10-CM

## 2024-07-19 DIAGNOSIS — Z9081 Acquired absence of spleen: Secondary | ICD-10-CM

## 2024-07-19 DIAGNOSIS — I129 Hypertensive chronic kidney disease with stage 1 through stage 4 chronic kidney disease, or unspecified chronic kidney disease: Secondary | ICD-10-CM | POA: Diagnosis present

## 2024-07-19 DIAGNOSIS — K5730 Diverticulosis of large intestine without perforation or abscess without bleeding: Secondary | ICD-10-CM | POA: Diagnosis present

## 2024-07-19 DIAGNOSIS — F329 Major depressive disorder, single episode, unspecified: Secondary | ICD-10-CM | POA: Diagnosis present

## 2024-07-19 DIAGNOSIS — E1151 Type 2 diabetes mellitus with diabetic peripheral angiopathy without gangrene: Secondary | ICD-10-CM | POA: Diagnosis present

## 2024-07-19 DIAGNOSIS — R32 Unspecified urinary incontinence: Secondary | ICD-10-CM | POA: Diagnosis present

## 2024-07-19 DIAGNOSIS — J439 Emphysema, unspecified: Secondary | ICD-10-CM | POA: Diagnosis present

## 2024-07-19 DIAGNOSIS — R54 Age-related physical debility: Secondary | ICD-10-CM | POA: Diagnosis present

## 2024-07-19 DIAGNOSIS — M109 Gout, unspecified: Secondary | ICD-10-CM | POA: Diagnosis present

## 2024-07-19 DIAGNOSIS — K6389 Other specified diseases of intestine: Secondary | ICD-10-CM | POA: Diagnosis not present

## 2024-07-19 DIAGNOSIS — F17210 Nicotine dependence, cigarettes, uncomplicated: Secondary | ICD-10-CM | POA: Diagnosis present

## 2024-07-19 DIAGNOSIS — E876 Hypokalemia: Secondary | ICD-10-CM | POA: Diagnosis present

## 2024-07-19 DIAGNOSIS — Z96611 Presence of right artificial shoulder joint: Secondary | ICD-10-CM | POA: Diagnosis present

## 2024-07-19 DIAGNOSIS — I10 Essential (primary) hypertension: Secondary | ICD-10-CM | POA: Diagnosis present

## 2024-07-19 DIAGNOSIS — I25118 Atherosclerotic heart disease of native coronary artery with other forms of angina pectoris: Secondary | ICD-10-CM | POA: Diagnosis not present

## 2024-07-19 DIAGNOSIS — Z833 Family history of diabetes mellitus: Secondary | ICD-10-CM

## 2024-07-19 DIAGNOSIS — N179 Acute kidney failure, unspecified: Secondary | ICD-10-CM | POA: Diagnosis present

## 2024-07-19 DIAGNOSIS — E1129 Type 2 diabetes mellitus with other diabetic kidney complication: Secondary | ICD-10-CM | POA: Diagnosis present

## 2024-07-19 DIAGNOSIS — J449 Chronic obstructive pulmonary disease, unspecified: Secondary | ICD-10-CM | POA: Diagnosis present

## 2024-07-19 DIAGNOSIS — I959 Hypotension, unspecified: Secondary | ICD-10-CM | POA: Diagnosis present

## 2024-07-19 DIAGNOSIS — E872 Acidosis, unspecified: Secondary | ICD-10-CM | POA: Diagnosis present

## 2024-07-19 DIAGNOSIS — E7800 Pure hypercholesterolemia, unspecified: Secondary | ICD-10-CM | POA: Diagnosis present

## 2024-07-19 DIAGNOSIS — Z8249 Family history of ischemic heart disease and other diseases of the circulatory system: Secondary | ICD-10-CM

## 2024-07-19 DIAGNOSIS — Z7984 Long term (current) use of oral hypoglycemic drugs: Secondary | ICD-10-CM

## 2024-07-19 DIAGNOSIS — R338 Other retention of urine: Secondary | ICD-10-CM | POA: Diagnosis present

## 2024-07-19 DIAGNOSIS — E861 Hypovolemia: Secondary | ICD-10-CM | POA: Diagnosis present

## 2024-07-19 LAB — CBC
HCT: 32 % — ABNORMAL LOW (ref 39.0–52.0)
Hemoglobin: 10.9 g/dL — ABNORMAL LOW (ref 13.0–17.0)
MCH: 34.3 pg — ABNORMAL HIGH (ref 26.0–34.0)
MCHC: 34.1 g/dL (ref 30.0–36.0)
MCV: 100.6 fL — ABNORMAL HIGH (ref 80.0–100.0)
Platelets: 319 K/uL (ref 150–400)
RBC: 3.18 MIL/uL — ABNORMAL LOW (ref 4.22–5.81)
RDW: 17.2 % — ABNORMAL HIGH (ref 11.5–15.5)
WBC: 7.3 K/uL (ref 4.0–10.5)
nRBC: 1.5 % — ABNORMAL HIGH (ref 0.0–0.2)

## 2024-07-19 LAB — URINALYSIS, ROUTINE W REFLEX MICROSCOPIC
Bilirubin Urine: NEGATIVE
Glucose, UA: NEGATIVE mg/dL
Ketones, ur: NEGATIVE mg/dL
Leukocytes,Ua: NEGATIVE
Nitrite: NEGATIVE
Protein, ur: 30 mg/dL — AB
Specific Gravity, Urine: 1.006 (ref 1.005–1.030)
pH: 5 (ref 5.0–8.0)

## 2024-07-19 LAB — COMPREHENSIVE METABOLIC PANEL WITH GFR
ALT: 11 U/L (ref 0–44)
AST: 22 U/L (ref 15–41)
Albumin: 4.1 g/dL (ref 3.5–5.0)
Alkaline Phosphatase: 96 U/L (ref 38–126)
Anion gap: 16 — ABNORMAL HIGH (ref 5–15)
BUN: 15 mg/dL (ref 8–23)
CO2: 16 mmol/L — ABNORMAL LOW (ref 22–32)
Calcium: 9.2 mg/dL (ref 8.9–10.3)
Chloride: 103 mmol/L (ref 98–111)
Creatinine, Ser: 4 mg/dL — ABNORMAL HIGH (ref 0.61–1.24)
GFR, Estimated: 15 mL/min — ABNORMAL LOW (ref >60)
Glucose, Bld: 93 mg/dL (ref 70–99)
Potassium: 3.4 mmol/L — ABNORMAL LOW (ref 3.5–5.1)
Sodium: 135 mmol/L (ref 135–145)
Total Bilirubin: 0.6 mg/dL (ref 0.0–1.2)
Total Protein: 7.3 g/dL (ref 6.5–8.1)

## 2024-07-19 LAB — LIPASE, BLOOD: Lipase: 58 U/L — ABNORMAL HIGH (ref 11–51)

## 2024-07-19 LAB — TSH: TSH: 4.191 u[IU]/mL (ref 0.350–4.500)

## 2024-07-19 LAB — GLUCOSE, CAPILLARY: Glucose-Capillary: 104 mg/dL — ABNORMAL HIGH (ref 70–99)

## 2024-07-19 LAB — MAGNESIUM: Magnesium: 1.8 mg/dL (ref 1.7–2.4)

## 2024-07-19 MED ORDER — SODIUM CHLORIDE 0.9 % IV BOLUS
1000.0000 mL | Freq: Once | INTRAVENOUS | Status: AC
  Administered 2024-07-19: 1000 mL via INTRAVENOUS

## 2024-07-19 MED ORDER — ACETAMINOPHEN 325 MG PO TABS: 650.0000 mg | ORAL_TABLET | Freq: Four times a day (QID) | ORAL | Status: DC | PRN

## 2024-07-19 MED ORDER — ACETAMINOPHEN 650 MG RE SUPP: 650.0000 mg | Freq: Four times a day (QID) | RECTAL | Status: DC | PRN

## 2024-07-19 MED ORDER — ONDANSETRON HCL 4 MG/2ML IJ SOLN
4.0000 mg | Freq: Once | INTRAMUSCULAR | Status: AC
  Administered 2024-07-19: 4 mg via INTRAVENOUS
  Filled 2024-07-19: qty 2

## 2024-07-19 MED ORDER — ENOXAPARIN SODIUM 40 MG/0.4ML IJ SOSY
40.0000 mg | PREFILLED_SYRINGE | INTRAMUSCULAR | Status: DC
  Administered 2024-07-19: 40 mg via SUBCUTANEOUS
  Filled 2024-07-19: qty 0.4

## 2024-07-19 MED ORDER — NICOTINE 14 MG/24HR TD PT24
14.0000 mg | MEDICATED_PATCH | Freq: Every day | TRANSDERMAL | Status: DC
  Administered 2024-07-19 – 2024-07-24 (×6): 14 mg via TRANSDERMAL
  Filled 2024-07-19 (×7): qty 1

## 2024-07-19 NOTE — ED Triage Notes (Addendum)
 Pt to ED via POV from home. Pt with sister. Sister reports pt has been having diarrhea since colonoscopy back in [place]. Sister also reports decreased appetite. Sent by doctor at [facility] for dehydration and possible cdiff. Sister reports frequent falls.

## 2024-07-19 NOTE — ED Provider Notes (Signed)
 [facility] Provider Note    Event Date/Time   First MD Initiated Contact with Patient 07/19/24 1730     (approximate)   History   Diarrhea (/)  Pt to ED via POV from home. Pt with sister. Sister reports pt has been having diarrhea since colonoscopy back in [place]. Sister also reports decreased appetite. Sent by doctor at [facility] for dehydration and possible cdiff. Sister reports frequent falls.    HPI Edward Singleton is a 73 y.o. male PMH multiple medical comorbidities including COPD, dementia, hypertension, CKD, T2DM, alcohol  abuse presents for evaluation of diarrhea, decreased appetite - Patient has apparently been having diarrhea since April when he had a colonoscopy.  Notes about 15 episodes daily.  Nonblack/nonbloody.  Has started avoiding p.o. intake because he has such frequent diarrhea.  Is having incontinence while sleeping. - Patient is on chronic cephalosporin antibiotic related to a complication of a right shoulder replacement - Was seen by his infectious disease doctor today who noted he has lost about 13 pounds over the past 6 months and is concerned about the severe diarrhea - Notes he has generalized fatigue when trying to do tasks around the house -No significant ongoing alcohol  use -No abdominal pain, no fevers - Collateral gathered from patient's sister at bedside       Physical Exam   Triage Vital Signs: ED Triage Vitals  Encounter Vitals Group     BP 07/19/24 1723 (!) 100/56     Girls Systolic BP Percentile --      Girls Diastolic BP Percentile --      Boys Systolic BP Percentile --      Boys Diastolic BP Percentile --      Pulse Rate 07/19/24 1723 71     Resp 07/19/24 1723 18     Temp 07/19/24 1723 98.1 F (36.7 C)     Temp Source 07/19/24 1723 Oral     SpO2 07/19/24 1723 98 %     Weight --      Height --      Head Circumference --      Peak Flow --      Pain Score 07/19/24 1724 3     Pain Loc --      Pain Education --       Exclude from Growth Chart --     Most recent vital signs: Vitals:   07/19/24 1723  BP: (!) 100/56  Pulse: 71  Resp: 18  Temp: 98.1 F (36.7 C)  SpO2: 98%     General: Awake, no distress.  Appears dehydrated. CV:  Good peripheral perfusion. RRR, RP 2+ Resp:  Normal effort. CTAB Abd:  No distention. Nontender to deep palpation throughout Neuro:  Alert and oriented, face symmetric, moving all extremity spontaneously, no focal motor deficit appreciated   ED Results / Procedures / Treatments   Labs (all labs ordered are listed, but only abnormal results are displayed) Labs Reviewed  LIPASE, BLOOD - Abnormal; Notable for the following components:      Result Value   Lipase 58 (*)    All other components within normal limits  COMPREHENSIVE METABOLIC PANEL WITH GFR - Abnormal; Notable for the following components:   Potassium 3.4 (*)    CO2 16 (*)    Creatinine, Ser 4.00 (*)    GFR, Estimated 15 (*)    Anion gap 16 (*)    All other components within normal limits  CBC - Abnormal; Notable for  the following components:   RBC 3.18 (*)    Hemoglobin 10.9 (*)    HCT 32.0 (*)    MCV 100.6 (*)    MCH 34.3 (*)    RDW 17.2 (*)    nRBC 1.5 (*)    All other components within normal limits  GASTROINTESTINAL PANEL BY PCR, STOOL (REPLACES STOOL CULTURE)  C DIFFICILE QUICK SCREEN W PCR REFLEX    URINALYSIS, ROUTINE W REFLEX MICROSCOPIC     EKG  N/a   RADIOLOGY N/a    PROCEDURES:  Critical Care performed: No  Procedures   MEDICATIONS ORDERED IN ED: Medications  sodium chloride  0.9 % bolus 1,000 mL (1,000 mLs Intravenous New Bag/Given 07/19/24 1818)  ondansetron  (ZOFRAN ) injection 4 mg (4 mg Intravenous Given 07/19/24 1819)     IMPRESSION / MDM / ASSESSMENT AND PLAN / ED COURSE  I reviewed the triage vital signs and the nursing notes.                              DDX/MDM/AP: Differential diagnosis includes, but is not limited to, consider C. difficile or  other infectious source of diarrhea, no clinical concern for acute intra-abdominal pathology given no report report of abdominal pain and no tenderness to deep palpation throughout, consider inflammatory bowel condition.  Also suspect dehydration with possible AKI/renal failure and/or electrolyte derangements.  Plan: - Labs  -IV fluid - will give antiemetic to help with appetite - No indication for advanced imaging at this time  Patient's presentation is most consistent with acute presentation with potential threat to life or bodily function.  The patient is on the cardiac monitor to evaluate for evidence of arrhythmia and/or significant heart rate changes.  ED course below.  Labs with notable AKI on CKD, suspect intravascular depletion.  No evidence of obstructive uropathy at this time (denies any associated symptoms, able to void without difficulty, no suprapubic distention), renal ultrasound ordered to facilitate inpatient workup.  Admitted to hospitalist service.  Clinical Course as of 07/19/24 RONOLD Latch Jul 19, 2024  1739 With no leukocytosis, anemia within baseline range, mild macrocytosis [MM]  1802 CMP with notable AKI on CKD, creatinine up to 4, baseline 1.6-1.7  Mild hypokalemia.  Lipase mildly elevated, not clinically significant [MM]  1813 Hospitalist consult order placed Renal ultrasound ordered [MM]    Clinical Course User Index [MM] Clarine Ozell LABOR, MD     FINAL CLINICAL IMPRESSION(S) / ED DIAGNOSES   Final diagnoses:  AKI (acute kidney injury)  Dehydration     Rx / DC Orders   ED Discharge Orders     None        Note:  This document was prepared using Dragon voice recognition software and may include unintentional dictation errors.   Clarine Ozell LABOR, MD 07/19/24 RONOLD

## 2024-07-19 NOTE — H&P (Addendum)
 History and Physical    Edward Singleton FMW:[id number] DOB: [birth year] DOA: 07/19/2024  DOS: the patient was seen and examined on 07/19/2024  PCP: Milissa Savant, MD   Patient coming from: Home  I have personally briefly reviewed patient's old medical records in [facility] and CareEverywhere  HPI:   Edward Singleton is a 73 y.o. year old male with past medical history of hypertension, hyperlipidemia, CKD 3B, gout, GERD and MDD presenting to the ED with chronic diarrhea.  Patient reports he had over 10 pound weight loss in the last 6 months.  Recently his diarrhea has worsened where he is having 10-15 episodes daily.  Diarrhea is nonbloody.  The diarrhea is causing significant concern to the patient to the point where he has decreased his p.o. intake.  Patient reports nocturnal episodes of incontinence.    Patient reports he had right shoulder replaced and is on abx since then.   Pt's diarrhea started after colonoscopy. He states it started afterwards and has been this.    ED Course: Patient sent to the ED by his PCP for his chronic diarrhea that appears to be worsening leading to dehydration.  In the ED he was hemodynamically stable with softer blood pressures but MAP greater than 65.  CBC without leukocytosis with stable macrocytic.  CMP with significant AKI with baseline 1.4 with current creatinine at 4.0.  Anion gap metabolic acidosis and hypokalemia.  TRH contacted for admission.  Review of Systems: As mentioned in the history of present illness. All other systems reviewed and are negative.   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    Diabetes mellitus without complication (HCC)    Gout    Gout    Headache    Hypercholesteremia    Hypertension    Kidney stones    Left flank pain, chronic    Pelvic fracture (HCC) 11/17/2014   Pneumonia     Prolonged QT syndrome    Septic arthritis of shoulder, right (HCC) 05/11/2022   White matter disease     Past Surgical History:  Procedure Laterality Date   COLONOSCOPY WITH PROPOFOL  N/A 07/06/2022   Procedure: COLONOSCOPY WITH PROPOFOL ;  Surgeon: Therisa Bi, MD;  Location: [facility] ENDOSCOPY;  Service: Gastroenterology;  Laterality: N/A;   COLONOSCOPY WITH PROPOFOL  N/A 01/10/2024   Procedure: COLONOSCOPY WITH PROPOFOL ;  Surgeon: Therisa Bi, MD;  Location: [facility] ENDOSCOPY;  Service: Gastroenterology;  Laterality: N/A;   ESOPHAGOGASTRODUODENOSCOPY Left 03/18/2019   Procedure: ESOPHAGOGASTRODUODENOSCOPY (EGD);  Surgeon: Therisa Bi, MD;  Location: [facility] ENDOSCOPY;  Service: Gastroenterology;  Laterality: Left;   ESOPHAGOGASTRODUODENOSCOPY N/A 07/06/2022   Procedure: ESOPHAGOGASTRODUODENOSCOPY (EGD);  Surgeon: Therisa Bi, MD;  Location: [facility] ENDOSCOPY;  Service: Gastroenterology;  Laterality: N/A;   ESOPHAGOGASTRODUODENOSCOPY (EGD) WITH PROPOFOL  N/A 01/10/2024   Procedure: ESOPHAGOGASTRODUODENOSCOPY (EGD) WITH PROPOFOL ;  Surgeon: Therisa Bi, MD;  Location: [facility] ENDOSCOPY;  Service: Gastroenterology;  Laterality: N/A;   FRACTURE SURGERY     left renal stent placement  10/11/2011   LITHOTRIPSY     ORIF HUMERUS FRACTURE Left 11/18/2014   Procedure: OPEN REDUCTION INTERNAL FIXATION (ORIF) DISTAL HUMERUS FRACTURE;  Surgeon: Ozell VEAR Bruch, MD;  Location: [facility] OR;  Service: Orthopedics;  Laterality: Left;   REVERSE SHOULDER ARTHROPLASTY Right 10/27/2021   Procedure: REVERSE SHOULDER ARTHROPLASTY;  Surgeon: Cristy Bonner DASEN, MD;  Location: [facility] OR;  Service: Orthopedics;  Laterality: Right;   REVISION TOTAL SHOULDER TO REVERSE TOTAL SHOULDER Right 05/11/2022   Procedure: REVISION TOTAL SHOULDER TO REVERSE TOTAL SHOULDER;  Surgeon: Cristy Bonner DASEN, MD;  Location: [facility] ORS;  Service: Orthopedics;  Laterality: Right;   SPLENECTOMY       No Known Allergies  Family History  Problem Relation Age of Onset   Heart  disease Mother    Heart disease Father    Diabetes Father    Nephrolithiasis Paternal Grandfather    Kidney disease Neg Hx    Prostate cancer Neg Hx     Prior to Admission medications   Medication Sig Start Date End Date Taking? Authorizing Provider  allopurinol  (ZYLOPRIM ) 300 MG tablet Take 150 mg by mouth daily. 07/11/22   [provider]  cefadroxil  (DURICEF) 500 MG capsule Take 250 mg by mouth 2 (two) times daily. 08/12/22   [provider]  escitalopram  (LEXAPRO ) 10 MG tablet Take 10 mg by mouth daily. 07/11/22   [provider]  folic acid  (FOLVITE ) 1 MG tablet Take 1 mg by mouth daily.    [provider]  metFORMIN  (GLUCOPHAGE ) 500 MG tablet Take 500 mg by mouth 2 (two) times daily. 06/04/19   [provider]  metoprolol  succinate (TOPROL -XL) 50 MG 24 hr tablet Take 0.5 tablets by mouth daily. 07/11/22   [provider]  omeprazole  (PRILOSEC) 40 MG capsule Take 1 capsule (40 mg total) by mouth in the morning and at bedtime. 01/10/24   Therisa Bi, MD  rosuvastatin  (CRESTOR ) 40 MG tablet Take 20 mg by mouth daily. 07/11/22   [provider]  thiamine  (VITAMIN B-1) 100 MG tablet Take 100 mg by mouth daily.    [provider]  traZODone  (DESYREL ) 50 MG tablet Take 1 tablet by mouth at bedtime. 07/11/22   [provider]  Gabapentin  unknown dosage at bedtime Iron  three times weekly    reports that he has been smoking cigarettes. He has a 50 pack-year smoking history. He has quit using smokeless tobacco.  His smokeless tobacco use included chew. He reports that he does not currently use alcohol  after a past usage of about 42.0 standard drinks of alcohol  per week. He reports that he does not use drugs. Lives by himself.  Tobacco- 1/2 ppd with up to 1 ppd since teenager.  EtOH- Heavy liquor but not since 6 months Illicit drug use- denies use.  IADLs/ADLs- can person independently at baseline    Physical  Exam: Vitals:   07/19/24 1800 07/19/24 1830 07/19/24 1900 07/19/24 2002  BP: 107/61 (!) 101/54 120/78 125/65  Pulse: 75 75 77 82  Resp:    19  Temp:    98 F (36.7 C)  TempSrc:    Oral  SpO2: 98% 94% 95% 95%     Gen: pleasant male in NAD HENT: Dry MM CV: RRR, good pulses Resp: decreased breath sounds, no abnormal sounds Abd: No TTP, bowel sounds present MSK: no asymmetry Skin: dry skin present, slightly raised irregular shaped lesions present on forehead and head Neuro: alert and oriented x4 Psych: normal mood   Labs on Admission: I have personally reviewed following labs and imaging studies  CBC: Recent Labs  Lab 07/19/24 1727  WBC 7.3  HGB 10.9*  HCT 32.0*  MCV 100.6*  PLT 319   Basic Metabolic Panel: Recent Labs  Lab 07/19/24 1727  NA 135  K 3.4*  CL 103  CO2 16*  GLUCOSE 93  BUN 15  CREATININE 4.00*  CALCIUM  9.2   GFR: CrCl cannot be calculated (Unknown ideal weight.). Liver Function Tests: Recent Labs  Lab 07/19/24 1727  AST 22  ALT 11  ALKPHOS 96  BILITOT 0.6  PROT 7.3  ALBUMIN 4.1   Recent Labs  Lab 07/19/24 1727  LIPASE 58*   No results for input(s): AMMONIA in the last 168 hours. Coagulation Profile: No results for input(s): INR, PROTIME in the last 168 hours. Cardiac Enzymes: No results for input(s): CKTOTAL, CKMB, CKMBINDEX, TROPONINI, TROPONINIHS in the last 168 hours. BNP (last 3 results) No results for input(s): BNP in the last 8760 hours. HbA1C: No results for input(s): HGBA1C in the last 72 hours. CBG: No results for input(s): GLUCAP in the last 168 hours. Lipid Profile: No results for input(s): CHOL, HDL, LDLCALC, TRIG, CHOLHDL, LDLDIRECT in the last 72 hours. Thyroid Function Tests: No results for input(s): TSH, T4TOTAL, FREET4, T3FREE, THYROIDAB in the last 72 hours. Anemia Panel: No results for input(s): VITAMINB12, FOLATE, FERRITIN, TIBC, IRON , RETICCTPCT in  the last 72 hours. Urine analysis:    Component Value Date/Time   COLORURINE YELLOW (A) 10/18/2021 1755   APPEARANCEUR Clear 12/05/2023 1511   LABSPEC 1.012 10/18/2021 1755   LABSPEC 1.014 01/15/2014 1320   PHURINE 5.0 10/18/2021 1755   GLUCOSEU Negative 12/05/2023 1511   GLUCOSEU Negative 01/15/2014 1320   HGBUR MODERATE (A) 10/18/2021 1755   BILIRUBINUR Negative 12/05/2023 1511   BILIRUBINUR Negative 01/15/2014 1320   KETONESUR NEGATIVE 10/18/2021 1755   PROTEINUR 2+ (A) 12/05/2023 1511   PROTEINUR 100 (A) 10/18/2021 1755   UROBILINOGEN 1.0 11/29/2014 1534   NITRITE Negative 12/05/2023 1511   NITRITE NEGATIVE 10/18/2021 1755   LEUKOCYTESUR Negative 12/05/2023 1511   LEUKOCYTESUR NEGATIVE 10/18/2021 1755   LEUKOCYTESUR Negative 01/15/2014 1320    Radiological Exams on Admission: I have personally reviewed images US  Renal Result Date: 07/19/2024 CLINICAL DATA:  Acute kidney injury. EXAM: RENAL / URINARY TRACT ULTRASOUND COMPLETE COMPARISON:  CT abdomen and pelvis 10/18/2021 FINDINGS: Right Kidney: Renal measurements: 10.1 x 5.6 x 5.3 cm = volume: 156 mL. Increased parenchymal echotexture with diffuse parenchymal thinning consistent with chronic medical renal disease. No hydronephrosis or hydroureter. Focal cystic lesion in the lateral right kidney measuring 1.6 cm maximal diameter. This was present on previous CT. No imaging follow-up is indicated. Left Kidney: Renal measurements: 5.6 x 3 x 2.7 cm = volume: 24 mL. Diffuse renal parenchymal atrophy with increased echotexture suggesting chronic medical renal disease. No hydronephrosis or hydroureter. Large cyst on the left kidney measuring 7.2 cm maximal diameter. No change since previous study. No imaging follow-up is indicated. Bladder: Appears normal for degree of bladder distention. Other: None. IMPRESSION: 1. Bilateral parenchymal atrophy and increased echotexture, greater on the left. This is consistent with chronic medical renal  disease. 2. No hydronephrosis or hydroureter. 3. Benign-appearing cysts bilaterally are similar to prior CT. No imaging follow-up is indicated. Electronically Signed   By: Elsie Gravely M.D.   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 Pt reports >10 watery stools daily. No blood reported. No other GI symptoms including n/v or abdominal pain. C diff suspected given prolong abx use and PPI use. Will get stool studies including C. Diff, GI panel, stool culture, osmolality, lactoferrin, elastase and TSH. Follow up studies. Holding abx and PPI. Metformin  could be contributing so holding that as well. Will discontinue this medication altogether for now.    Renal Failure: Pt with acute renal failure suspected secondary to dehydration. Getting LR 125 cc/hr. Repeat BMP q8hrs. Rena ultrasound ordered. Replete Mag and K as needed. Will replete K with caution. Holding home gabapentin , and other renally cleared medications.   Skin Lesions: Pts skin lesions have concerning features. Pt would benefit from one time OP dermatology visit. Will defer this to his PCP.   Chronic Problems: Gout: holding home allopurinol .  HLD: continue home statin T2DM: holding home metformin . Monitor CBG and if elevated then start SSI.  HTN: Continue home beta blocker.  GERD: holding PPI currently.   VTE prophylaxis:  Lovenox   Diet: Regular Code Status:  Full Code Telemetry:  Admission status: Inpatient,  Med-Surg Patient is from: Home  Anticipated d/c is to: Home  Anticipated d/c is in: 2-3 days    Family Communication: Updated at bedside   Consults called: None    Severity of Illness: The appropriate patient status for this patient is INPATIENT. Inpatient status is judged to be reasonable and necessary in order to provide the required intensity of service to ensure the patient's safety. The patient's presenting symptoms, physical exam findings, and initial radiographic and laboratory data in the context of their chronic comorbidities is felt to place them at high risk for further clinical deterioration. Furthermore, it is not anticipated that the patient will be medically stable for discharge from the hospital within 2 midnights of admission.   * I certify that at the point of admission it is my clinical judgment that the patient will require inpatient hospital care spanning beyond 2 midnights from the point of admission due to high intensity of service, high risk for further deterioration and high frequency of surveillance required.DEWAINE Morene Bathe, MD [facility] DEL. [facility]

## 2024-07-20 DIAGNOSIS — K529 Noninfective gastroenteritis and colitis, unspecified: Secondary | ICD-10-CM | POA: Diagnosis not present

## 2024-07-20 LAB — BASIC METABOLIC PANEL WITH GFR
Anion gap: 10 (ref 5–15)
BUN: 16 mg/dL (ref 8–23)
CO2: 17 mmol/L — ABNORMAL LOW (ref 22–32)
Calcium: 8.4 mg/dL — ABNORMAL LOW (ref 8.9–10.3)
Chloride: 111 mmol/L (ref 98–111)
Creatinine, Ser: 4.01 mg/dL — ABNORMAL HIGH (ref 0.61–1.24)
GFR, Estimated: 15 mL/min — ABNORMAL LOW (ref >60)
Glucose, Bld: 54 mg/dL — ABNORMAL LOW (ref 70–99)
Potassium: 3 mmol/L — ABNORMAL LOW (ref 3.5–5.1)
Sodium: 138 mmol/L (ref 135–145)

## 2024-07-20 LAB — GASTROINTESTINAL PANEL BY PCR, STOOL (REPLACES STOOL CULTURE)

## 2024-07-20 LAB — LACTOFERRIN, FECAL, QUALITATIVE: Lactoferrin, Fecal, Qual: POSITIVE — AB

## 2024-07-20 LAB — CBC
HCT: 25.2 % — ABNORMAL LOW (ref 39.0–52.0)
Hemoglobin: 8.5 g/dL — ABNORMAL LOW (ref 13.0–17.0)
MCH: 33.9 pg (ref 26.0–34.0)
MCHC: 33.7 g/dL (ref 30.0–36.0)
MCV: 100.4 fL — ABNORMAL HIGH (ref 80.0–100.0)
Platelets: 270 K/uL (ref 150–400)
RBC: 2.51 MIL/uL — ABNORMAL LOW (ref 4.22–5.81)
RDW: 17.2 % — ABNORMAL HIGH (ref 11.5–15.5)
WBC: 7.6 K/uL (ref 4.0–10.5)
nRBC: 1.4 % — ABNORMAL HIGH (ref 0.0–0.2)

## 2024-07-20 LAB — GLUCOSE, CAPILLARY
Glucose-Capillary: 55 mg/dL — ABNORMAL LOW (ref 70–99)
Glucose-Capillary: 82 mg/dL (ref 70–99)
Glucose-Capillary: 87 mg/dL (ref 70–99)
Glucose-Capillary: 91 mg/dL (ref 70–99)

## 2024-07-20 LAB — C DIFFICILE QUICK SCREEN W PCR REFLEX
C Diff antigen: NEGATIVE
C Diff interpretation: NOT DETECTED
C Diff toxin: NEGATIVE

## 2024-07-20 LAB — C-REACTIVE PROTEIN: CRP: <0.5 mg/dL (ref <1.0)

## 2024-07-20 MED ORDER — LOPERAMIDE HCL 2 MG PO CAPS
2.0000 mg | ORAL_CAPSULE | Freq: Two times a day (BID) | ORAL | Status: DC | PRN
  Administered 2024-07-20 – 2024-07-21 (×2): 2 mg via ORAL
  Filled 2024-07-20 (×2): qty 1

## 2024-07-20 MED ORDER — SODIUM CHLORIDE 0.9 % IV SOLN: INTRAVENOUS | Status: DC

## 2024-07-20 MED ORDER — ENOXAPARIN SODIUM 30 MG/0.3ML IJ SOSY
30.0000 mg | PREFILLED_SYRINGE | INTRAMUSCULAR | Status: DC
  Administered 2024-07-20 – 2024-07-24 (×5): 30 mg via SUBCUTANEOUS
  Filled 2024-07-20 (×5): qty 0.3

## 2024-07-20 MED ORDER — METOPROLOL SUCCINATE ER 25 MG PO TB24
25.0000 mg | ORAL_TABLET | Freq: Every day | ORAL | Status: DC
  Filled 2024-07-20: qty 1

## 2024-07-20 MED ORDER — ROSUVASTATIN CALCIUM 20 MG PO TABS
20.0000 mg | ORAL_TABLET | Freq: Every day | ORAL | Status: DC
Start: 2024-07-20 — End: 2024-07-25
  Administered 2024-07-20 – 2024-07-25 (×6): 20 mg via ORAL
  Filled 2024-07-20 (×7): qty 1

## 2024-07-20 MED ORDER — POTASSIUM CHLORIDE CRYS ER 20 MEQ PO TBCR
40.0000 meq | EXTENDED_RELEASE_TABLET | ORAL | Status: AC
  Administered 2024-07-20 (×2): 40 meq via ORAL
  Filled 2024-07-20 (×2): qty 2

## 2024-07-20 NOTE — Progress Notes (Signed)
  PROGRESS NOTE    Edward Singleton  FMW:[id number] DOB: 1951-02-19 DOA: 07/19/2024 PCP: Milissa Savant, MD  114A/114A-AA  LOS: 1 day   Brief hospital course:   Assessment & Plan: Edward Singleton is a 73 y.o. year old male with past medical history of hypertension, hyperlipidemia, CKD 3B, gout, GERD and MDD presenting to the ED with chronic diarrhea.  Patient reports he had over 10 pound weight loss in the last 6 months.  Recently his diarrhea has worsened where he is having 10-15 episodes daily.  Diarrhea is nonbloody.  The diarrhea is causing significant concern to the patient to the point where he has decreased his p.o. intake.  Patient reports nocturnal episodes of incontinence.    Chronic diarrhea --had colonoscopy on 01/10/24 which showed some colitis. --C diff and GI path neg --supportive care --outpatient GI f/u  AKI --Cr 4.0 on presentation.  Was 1.69 back in Nov 2024.  Likely due to dehydration for diarrhea. --cont MIVF  Hypokalemia --monitor and supplement PRN   Skin Lesions:  Pts skin lesions have concerning features. Pt would benefit from one time OP dermatology visit. Will defer this to his PCP.    Chronic Problems: Gout: holding home allopurinol .  HLD: continue home statin T2DM: holding home metformin .  HTN:  Hold Toprol  due to hypotension   DVT prophylaxis: Lovenox  SQ Code Status: DNR  Family Communication: sister (POA) updated on the phone today Level of care: Med-Surg Dispo:   The patient is from: home Anticipated d/c is to: home Anticipated d/c date is: 2-3 days   Subjective and Interval History:  Diarrhea had slowed down. No abdominal pain.  Pt eating.   Objective: Vitals:   07/19/24 2002 07/20/24 0432 07/20/24 0857 07/20/24 1515  BP: 125/65 (!) 119/108 (!) 95/52 (!) 100/55  Pulse: 82 88 83 89  Resp: 19  20 18   Temp: 98 F (36.7 C) (!) 97.4 F (36.3 C) 97.6 F (36.4 C) 97.7 F (36.5 C)  TempSrc: Oral Oral Oral Oral  SpO2: 95% 98% 92% 98%     Intake/Output Summary (Last 24 hours) at 07/20/2024 1804 Last data filed at 07/20/2024 1300 Gross per 24 hour  Intake 480 ml  Output --  Net 480 ml   There were no vitals filed for this visit.  Examination:   Constitutional: NAD, AAOx3 HEENT: conjunctivae and lids normal, EOMI CV: No cyanosis.   RESP: normal respiratory effort, on RA Neuro: II - XII grossly intact.   Psych: Normal mood and affect.  Appropriate judgement and reason   Data Reviewed: I have personally reviewed labs and imaging studies  Time spent: 50 minutes  Ellouise Haber, MD [facility] If 7PM-7AM, please contact night-coverage 07/20/2024, 6:04 PM

## 2024-07-20 NOTE — Plan of Care (Signed)
  Problem: Clinical Measurements: Goal: Ability to maintain clinical measurements within normal limits will improve Outcome: Progressing   Problem: Activity: Goal: Risk for activity intolerance will decrease Outcome: Progressing   Problem: Elimination: Goal: Will not experience complications related to bowel motility Outcome: Progressing   Problem: Pain Managment: Goal: General experience of comfort will improve and/or be controlled Outcome: Progressing   Problem: Safety: Goal: Ability to remain free from injury will improve Outcome: Progressing   Problem: Skin Integrity: Goal: Risk for impaired skin integrity will decrease Outcome: Progressing

## 2024-07-20 NOTE — Plan of Care (Signed)

## 2024-07-21 ENCOUNTER — Inpatient Hospital Stay

## 2024-07-21 DIAGNOSIS — K529 Noninfective gastroenteritis and colitis, unspecified: Secondary | ICD-10-CM | POA: Diagnosis not present

## 2024-07-21 LAB — BASIC METABOLIC PANEL WITH GFR
Anion gap: 12 (ref 5–15)
BUN: 18 mg/dL (ref 8–23)
CO2: 18 mmol/L — ABNORMAL LOW (ref 22–32)
Calcium: 8.8 mg/dL — ABNORMAL LOW (ref 8.9–10.3)
Chloride: 111 mmol/L (ref 98–111)
Creatinine, Ser: 4.12 mg/dL — ABNORMAL HIGH (ref 0.61–1.24)
GFR, Estimated: 15 mL/min — ABNORMAL LOW (ref >60)
Glucose, Bld: 78 mg/dL (ref 70–99)
Potassium: 4 mmol/L (ref 3.5–5.1)
Sodium: 141 mmol/L (ref 135–145)

## 2024-07-21 LAB — CBC
HCT: 28.1 % — ABNORMAL LOW (ref 39.0–52.0)
Hemoglobin: 9.4 g/dL — ABNORMAL LOW (ref 13.0–17.0)
MCH: 34.1 pg — ABNORMAL HIGH (ref 26.0–34.0)
MCHC: 33.5 g/dL (ref 30.0–36.0)
MCV: 101.8 fL — ABNORMAL HIGH (ref 80.0–100.0)
Platelets: 272 K/uL (ref 150–400)
RBC: 2.76 MIL/uL — ABNORMAL LOW (ref 4.22–5.81)
RDW: 17.6 % — ABNORMAL HIGH (ref 11.5–15.5)
WBC: 11.8 K/uL — ABNORMAL HIGH (ref 4.0–10.5)
nRBC: 1.3 % — ABNORMAL HIGH (ref 0.0–0.2)

## 2024-07-21 LAB — GLUCOSE, CAPILLARY
Glucose-Capillary: 74 mg/dL (ref 70–99)
Glucose-Capillary: 94 mg/dL (ref 70–99)
Glucose-Capillary: 78 mg/dL (ref 70–99)

## 2024-07-21 LAB — BRAIN NATRIURETIC PEPTIDE: B Natriuretic Peptide: 336.8 pg/mL — ABNORMAL HIGH (ref 0.0–100.0)

## 2024-07-21 LAB — MAGNESIUM: Magnesium: 2.2 mg/dL (ref 1.7–2.4)

## 2024-07-21 MED ORDER — MIDODRINE HCL 5 MG PO TABS
10.0000 mg | ORAL_TABLET | Freq: Three times a day (TID) | ORAL | Status: DC
Start: 2024-07-21 — End: 2024-07-23
  Administered 2024-07-21 – 2024-07-22 (×4): 10 mg via ORAL
  Filled 2024-07-21 (×5): qty 2

## 2024-07-21 MED ORDER — IPRATROPIUM-ALBUTEROL 0.5-2.5 (3) MG/3ML IN SOLN
3.0000 mL | Freq: Three times a day (TID) | RESPIRATORY_TRACT | Status: DC
  Administered 2024-07-21 – 2024-07-22 (×2): 3 mL via RESPIRATORY_TRACT
  Filled 2024-07-21 (×2): qty 3

## 2024-07-21 MED ORDER — SODIUM CHLORIDE 0.9 % IV BOLUS
250.0000 mL | Freq: Once | INTRAVENOUS | Status: AC
  Administered 2024-07-21: 250 mL via INTRAVENOUS

## 2024-07-21 MED ORDER — SODIUM CHLORIDE 0.9 % IV SOLN: INTRAVENOUS | Status: DC

## 2024-07-21 NOTE — Progress Notes (Signed)
 Mobility Specialist - Progress Note   Pre-mobility: SpO2: 91 on 4L During mobility: SpO2:88% on 6L during ambulation Post-mobility: SPO2:92 on 6L to recover after several seconds of deep breathing. Put back on 4L     07/21/24 1254  Therapy Vitals  Temp 98.3 F (36.8 C)  Temp Source Oral  Pulse Rate 79  BP (!) 96/58  Patient Position (if appropriate) Lying  Oxygen Therapy  SpO2 91 %  O2 Device Nasal Cannula  O2 Flow Rate (L/min) 4 L/min  Mobility  Activity Ambulated with assistance;Stood at bedside;Dangled on edge of bed  Level of Assistance Independent after set-up  Assistive Device None  Distance Ambulated (ft) 160 ft  Range of Motion/Exercises Active  Activity Response Tolerated well  Mobility Referral Yes  Mobility visit 1 Mobility  Mobility Specialist Start Time (ACUTE ONLY) 1236  Mobility Specialist Stop Time (ACUTE ONLY) 1251  Mobility Specialist Time Calculation (min) (ACUTE ONLY) 15 min   Pt resting in bed on 4L upon entry. Pt STS and ambulates to hallway around NS Indep after set up. Pt required x2 breaks due to SOB and pushed to 6L to remain around 87-88%. Pt returned to bed and left with needs in reach sitting EOB. Pt performed deep breathing exercises for 30 seconds on 6L to recover to 92% and placed back on 4L.   Guido Rumble Mobility Specialist 07/21/24, 1:10 PM

## 2024-07-21 NOTE — Plan of Care (Signed)
 Patient had a good day overall. He had multiple loose BM throughout the day. I had to give a bolus for BP.   Problem: Education: Goal: Knowledge of General Education information will improve Description: Including pain rating scale, medication(s)/side effects and non-pharmacologic comfort measures Outcome: Progressing   Problem: Health Behavior/Discharge Planning: Goal: Ability to manage health-related needs will improve Outcome: Progressing   Problem: Clinical Measurements: Goal: Ability to maintain clinical measurements within normal limits will improve Outcome: Progressing Goal: Will remain free from infection Outcome: Progressing Goal: Diagnostic test results will improve Outcome: Progressing Goal: Respiratory complications will improve Outcome: Progressing Goal: Cardiovascular complication will be avoided Outcome: Progressing   Problem: Activity: Goal: Risk for activity intolerance will decrease Outcome: Progressing   Problem: Nutrition: Goal: Adequate nutrition will be maintained Outcome: Progressing   Problem: Coping: Goal: Level of anxiety will decrease Outcome: Progressing   Problem: Elimination: Goal: Will not experience complications related to bowel motility Outcome: Progressing Goal: Will not experience complications related to urinary retention Outcome: Progressing   Problem: Pain Managment: Goal: General experience of comfort will improve and/or be controlled Outcome: Progressing   Problem: Safety: Goal: Ability to remain free from injury will improve Outcome: Progressing   Problem: Skin Integrity: Goal: Risk for impaired skin integrity will decrease Outcome: Progressing

## 2024-07-21 NOTE — Progress Notes (Signed)
 Morning vitals didn't flow over from machine so I had to retake

## 2024-07-21 NOTE — Progress Notes (Signed)
  PROGRESS NOTE    Edward Singleton  FMW:[id number] DOB: Oct 17, 1950 DOA: 07/19/2024 PCP: Milissa Savant, MD  114A/114A-AA  LOS: 2 days   Brief hospital course:   Assessment & Plan: Edward Singleton is a 73 y.o. year old male with past medical history of hypertension, hyperlipidemia, CKD 3B, gout, GERD and MDD presenting to the ED with chronic diarrhea.  Patient reports he had over 10 pound weight loss in the last 6 months.  Recently his diarrhea has worsened where he is having 10-15 episodes daily.  Diarrhea is nonbloody.  The diarrhea is causing significant concern to the patient to the point where he has decreased his p.o. intake.  Patient reports nocturnal episodes of incontinence.    Chronic diarrhea --had colonoscopy on 01/10/24 which showed some colitis. --C diff and GI path neg --Imodium PRN --cont MIVF --GI consult tomorrow  AKI --Cr 4.0 on presentation.  Was 1.69 back in Nov 2024.  Thought to be due to dehydration for diarrhea, however, Cr did not improve with MIVF. --cont MIVF --nephro consult tomorrow  Acute hypoxemic respiratory failure --developed overnight, 83% on room air, was placed on 3L.   --CXR showed only COPD changes.  No sign of fluid overload or infection. --start DuoNeb scheduled. --Continue supplemental O2 to keep sats >=90%, wean as tolerated  Hypotension --asymptomatic.   --250 ml bolus for systolic <90 --start midodrine  10 TID  Hypokalemia --monitor and supplement PRN   Skin Lesions:  Pts skin lesions have concerning features. Pt would benefit from one time OP dermatology visit. Will defer this to his PCP.    Chronic Problems: Gout: holding home allopurinol .  HLD: continue home statin T2DM: holding home metformin .  HTN:  Hold Toprol  due to hypotension   DVT prophylaxis: Lovenox  SQ Code Status: DNR  Family Communication: sister (POA) updated on the phone today Level of care: Med-Surg Dispo:   The patient is from: home Anticipated d/c is to:  home Anticipated d/c date is: > 3 days   Subjective and Interval History:  Overnight, pt had dyspnea and hypoxia, and was placed on 3L O2.  Pt reported less short of breath during rounds.  CXR showed just COPD changes.   Objective: Vitals:   07/21/24 0935 07/21/24 0942 07/21/24 1254 07/21/24 1614  BP: (!) 81/45  (!) 96/58 (!) 79/53  Pulse: 87  79 80  Resp: 18   18  Temp: 98.1 F (36.7 C)  98.3 F (36.8 C) 98.3 F (36.8 C)  TempSrc: Oral  Oral Oral  SpO2: (!) 88% (!) 89% 91% 94%    Intake/Output Summary (Last 24 hours) at 07/21/2024 1722 Last data filed at 07/21/2024 1300 Gross per 24 hour  Intake 942.42 ml  Output --  Net 942.42 ml   There were no vitals filed for this visit.  Examination:   Constitutional: NAD, AAOx3 HEENT: conjunctivae and lids normal, EOMI CV: No cyanosis.   RESP: normal respiratory effort, on 4L Extremities: No effusions, edema in BLE SKIN: warm, dry Neuro: II - XII grossly intact.   Psych: Normal mood and affect.  Appropriate judgement and reason   Data Reviewed: I have personally reviewed labs and imaging studies  Time spent: 50 minutes  Ellouise Haber, MD [facility] If 7PM-7AM, please contact night-coverage 07/21/2024, 5:22 PM

## 2024-07-22 DIAGNOSIS — K529 Noninfective gastroenteritis and colitis, unspecified: Secondary | ICD-10-CM | POA: Diagnosis not present

## 2024-07-22 LAB — STOOL CULTURE

## 2024-07-22 LAB — CREATININE, SERUM
Creatinine, Ser: 3.74 mg/dL — ABNORMAL HIGH (ref 0.61–1.24)
GFR, Estimated: 16 mL/min — ABNORMAL LOW (ref >60)

## 2024-07-22 LAB — PANCREATIC ELASTASE, FECAL: Pancreatic Elastase-1, Stool: 517 ug Elast./g (ref >200)

## 2024-07-22 LAB — GLUCOSE, CAPILLARY
Glucose-Capillary: 144 mg/dL — ABNORMAL HIGH (ref 70–99)
Glucose-Capillary: 151 mg/dL — ABNORMAL HIGH (ref 70–99)

## 2024-07-22 MED ORDER — SODIUM CHLORIDE 0.9 % IV SOLN: INTRAVENOUS | Status: DC

## 2024-07-22 MED ORDER — IPRATROPIUM-ALBUTEROL 0.5-2.5 (3) MG/3ML IN SOLN
3.0000 mL | Freq: Two times a day (BID) | RESPIRATORY_TRACT | Status: DC
  Administered 2024-07-22 – 2024-07-24 (×4): 3 mL via RESPIRATORY_TRACT
  Filled 2024-07-22 (×4): qty 3

## 2024-07-22 MED ORDER — PEG 3350-KCL-NA BICARB-NACL 420 G PO SOLR
4000.0000 mL | Freq: Once | ORAL | Status: AC
  Administered 2024-07-22: 4000 mL via ORAL
  Filled 2024-07-22: qty 4000

## 2024-07-22 NOTE — Progress Notes (Signed)
  PROGRESS NOTE    Edward Singleton  FMW:[id number] DOB: 1951/09/10 DOA: 07/19/2024 PCP: Milissa Savant, MD  114A/114A-AA  LOS: 3 days   Brief hospital course:   Assessment & Plan: Edward Singleton is a 73 y.o. year old male with past medical history of hypertension, hyperlipidemia, CKD 3B, gout, GERD and MDD presenting to the ED with chronic diarrhea.  Patient reports he had over 10 pound weight loss in the last 6 months.  Recently his diarrhea has worsened where he is having 10-15 episodes daily.  Diarrhea is nonbloody.  The diarrhea is causing significant concern to the patient to the point where he has decreased his p.o. intake.  Patient reports nocturnal episodes of incontinence.    Chronic diarrhea --had colonoscopy on 01/10/24 which showed some colitis. --C diff and GI path neg --Imodium PRN --cont MIVF --GI consult today, plan for colonoscopy tomorrow  AKI --Cr 4.0 on presentation.  Was 1.69 back in Nov 2024.  Thought to be due to dehydration for diarrhea, however, Cr did not improve with MIVF. --nephro consult today --cont MIVF  Acute hypoxemic respiratory failure --developed overnight, 83% on room air, was placed on 3L.   --CXR showed only COPD changes.  No sign of fluid overload or infection. --cont DuoNeb --Continue supplemental O2 to keep sats >=90%, wean as tolerated  Hypotension --asymptomatic.   --250 ml bolus for systolic <90 --cont midodrine  (new)  Hypokalemia --monitor and supplement PRN   Skin Lesions:  Pts skin lesions have concerning features. Pt would benefit from one time OP dermatology visit. Will defer this to his PCP.    Chronic Problems: Gout: holding home allopurinol .  HLD: continue home statin T2DM: holding home metformin .  HTN:  Hold Toprol  due to hypotension   DVT prophylaxis: Lovenox  SQ Code Status: DNR  Family Communication:  Level of care: Med-Surg Dispo:   The patient is from: home Anticipated d/c is to: home Anticipated d/c date  is: 2-3 days   Subjective and Interval History:  Pt was found to retain urine today.  I/O with 600 ml.  Nephro and GI consulted today.   Objective: Vitals:   07/22/24 0748 07/22/24 1601 07/22/24 1952 07/22/24 2114  BP: (!) 87/41 111/64 99/67   Pulse: 80 75 82   Resp: 19 18    Temp: 98 F (36.7 C) 98.5 F (36.9 C) (!) 97.4 F (36.3 C)   TempSrc:      SpO2: 95% 94% 97% 97%    Intake/Output Summary (Last 24 hours) at 07/22/2024 2302 Last data filed at 07/22/2024 1822 Gross per 24 hour  Intake 1703.38 ml  Output 500 ml  Net 1203.38 ml   There were no vitals filed for this visit.  Examination:   Constitutional: NAD, AAOx3 HEENT: conjunctivae and lids normal, EOMI CV: No cyanosis.   RESP: normal respiratory effort Neuro: II - XII grossly intact.     Data Reviewed: I have personally reviewed labs and imaging studies  Time spent: 35 minutes  Ellouise Haber, MD [facility] If 7PM-7AM, please contact night-coverage 07/22/2024, 11:02 PM
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
## 2024-07-22 NOTE — Consult Note (Signed)
 Edward Copping, MD [facility]  [address] [place], [place] [address] Phone: [phone] Fax : [phone]  Consultation  Referring Provider:     Dr. Awanda Primary Care Physician:  Edward Savant, MD Primary Gastroenterologist:  Dr. Therisa         Reason for Consultation:     Diarrhea  Date of Admission:  07/19/2024 Date of Consultation:  07/22/2024      HPI:   Edward Singleton is a 73 y.o. male who came into the emergency department on the 10th of this month for report of having diarrhea since a colonoscopy back in April.  The patient has a history of COPD, dementia, hypertension, chronic kidney disease, type 2 diabetes mellitus with decreased appetite and diarrhea.  It is reported that the patient has been having 15 episodes of diarrhea daily that is nonbloody and has decreased his p.o. intake because of the diarrhea. The patient has been reported to be on chronic cephalosporins related to a complication of the right shoulder replacement.  The patient's hospitalization has showed a negative C. difficile with a positive lactoferrin.  The patient's hemoglobin 11 months ago was approximately 8.7 but the patient came in dehydrated with a hemoglobin of 12.5 that went down to 8.5 with hydration.  The patient's colonoscopy in April showed:  Impression:  - The examined portion of the ileum was normal.  - Patchy moderate inflammation was found in the ascending colon secondary to colitis. Biopsied.  - Diverticulosis in the sigmoid colon.  - The examination was otherwise normal on direct and retroflexion views.  The patient's pathology on the biopsies showed:  FINAL DIAGNOSIS        1. Colon, biopsy, cbx right :       - COLONIC MUCOSA WITH PATCHY MINIMAL ACTIVE COLITIS INVOLVING A MINORITY OF THE       BIOPSY FRAGMENTS, SEE NOTE.       - NEGATIVE FOR GRANULOMAS, DYSPLASIA, AND MALIGNANCY.        2. Transverse Colon Biopsy, cbx :       - UNREMARKABLE COLONIC MUCOSA.       -  NEGATIVE FOR GRANULOMAS, DYSPLASIA, AND MALIGNANCY.        3. Colon, biopsy, cbx left :       - UNREMARKABLE COLONIC MUCOSA.       - NEGATIVE FOR GRANULOMAS, DYSPLASIA, AND MALIGNANCY.   The patient's celiac panel was negative as was the H. pylori breath test.  The patient reports that he did not have this kind of diarrhea prior to having a colonoscopy and states that it started after the colonoscopy.  He states that his sister is his medical power of attorney and he also reports that he has not followed up with Dr. Therisa or any other gastroenterologist despite the 6 months of diarrhea.   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 ENDOSCOPY;  Service: Gastroenterology;  Laterality: N/A;   COLONOSCOPY WITH PROPOFOL  N/A 01/10/2024   Procedure: COLONOSCOPY WITH PROPOFOL ;  Surgeon: Therisa Bi, MD;  Location: [facility] ENDOSCOPY;  Service: Gastroenterology;  Laterality: N/A;   ESOPHAGOGASTRODUODENOSCOPY Left 03/18/2019   Procedure: ESOPHAGOGASTRODUODENOSCOPY (EGD);  Surgeon: Therisa Bi, MD;  Location: [facility] ENDOSCOPY;  Service: Gastroenterology;  Laterality: Left;   ESOPHAGOGASTRODUODENOSCOPY N/A 07/06/2022   Procedure: ESOPHAGOGASTRODUODENOSCOPY (EGD);  Surgeon: Therisa Bi, MD;  Location: [facility] ENDOSCOPY;  Service: Gastroenterology;  Laterality: N/A;   ESOPHAGOGASTRODUODENOSCOPY (EGD)  WITH PROPOFOL  N/A 01/10/2024   Procedure: ESOPHAGOGASTRODUODENOSCOPY (EGD) WITH PROPOFOL ;  Surgeon: Therisa Bi, MD;  Location: [facility] ENDOSCOPY;  Service: Gastroenterology;  Laterality: N/A;   FRACTURE SURGERY     left renal stent placement  10/11/2011   LITHOTRIPSY     ORIF HUMERUS FRACTURE Left 11/18/2014   Procedure: OPEN REDUCTION INTERNAL FIXATION (ORIF) DISTAL HUMERUS FRACTURE;  Surgeon: Ozell VEAR Bruch, MD;  Location: [facility] OR;  Service: Orthopedics;  Laterality: Left;   REVERSE SHOULDER ARTHROPLASTY Right 10/27/2021   Procedure: REVERSE SHOULDER ARTHROPLASTY;  Surgeon: Cristy Bonner DASEN, MD;  Location: [facility] OR;  Service: Orthopedics;  Laterality: Right;   REVISION TOTAL SHOULDER TO REVERSE TOTAL SHOULDER Right 05/11/2022   Procedure: REVISION TOTAL SHOULDER TO REVERSE TOTAL SHOULDER;  Surgeon: Cristy Bonner DASEN, MD;  Location: [facility] ORS;  Service: Orthopedics;  Laterality: Right;   SPLENECTOMY      Prior to Admission medications   Medication Sig Start Date End Date Taking? Authorizing Provider  allopurinol  (ZYLOPRIM ) 300 MG tablet Take 150 mg by mouth daily. 07/11/22   [provider]  cefadroxil  (DURICEF) 500 MG capsule Take 250 mg by mouth 2 (two) times daily. 08/12/22   [provider]  escitalopram  (LEXAPRO ) 10 MG tablet Take 10 mg by mouth daily. 07/11/22   [provider]  folic acid  (FOLVITE ) 1 MG tablet Take 1 mg by mouth daily.    [provider]  metFORMIN  (GLUCOPHAGE ) 500 MG tablet Take 500 mg by mouth 2 (two) times daily. 06/04/19   [provider]  metoprolol  succinate (TOPROL -XL) 50 MG 24 hr tablet Take 0.5 tablets by mouth daily. 07/11/22   [provider]  omeprazole  (PRILOSEC) 40 MG capsule Take 1 capsule (40 mg total) by mouth in the morning and at bedtime. 01/10/24   Therisa Bi, MD  rosuvastatin  (CRESTOR ) 40 MG tablet Take 20 mg by mouth daily. 07/11/22   [provider]  thiamine  (VITAMIN B-1) 100 MG tablet Take 100 mg by mouth  daily.    [provider]  traZODone  (DESYREL ) 50 MG tablet Take 1 tablet by mouth at bedtime. 07/11/22   [provider]    Family History  Problem Relation Age of Onset   Heart disease Mother    Heart disease Father    Diabetes Father    Nephrolithiasis Paternal Grandfather    Kidney disease Neg Hx    Prostate cancer Neg Hx      Social History   Tobacco Use   Smoking status: Every Day    Current packs/day: 1.00    Average packs/day: 1 pack/day for 50.0 years (50.0 ttl pk-yrs)    Types: Cigarettes   Smokeless tobacco: Former    Types: Engineer, drilling   Vaping status: Never Used  Substance Use Topics   Alcohol  use: Not Currently    Alcohol /week: 42.0 standard drinks of alcohol     Types: 42 Cans of beer per week    Comment: quit june 4th   Drug use: No    Allergies as of 07/19/2024   (No Known Allergies)    Review of Systems:    All systems reviewed and negative except where noted in HPI.   Physical Exam:  Vital signs in last 24 hours: Temp:  [97.7 F (36.5 C)-98.3 F (36.8 C)] 98 F (36.7 C) (10/13 0748) Pulse Rate:  [79-81] 80 (10/13 0748) Resp:  [18-19] 19 (10/13 0748) BP: (79-130)/(41-71) 87/41 (10/13 0748) SpO2:  [82 %-95 %] 95 % (10/13 0748) Last BM Date : 07/22/24 General:   Pleasant, cooperative in NAD Head:  Normocephalic and atraumatic. Eyes:   No icterus.   Conjunctiva pink. PERRLA. Ears:  Normal auditory acuity. Neck:  Supple; no masses or thyroidomegaly Lungs: Respirations even and unlabored. Lungs clear to auscultation bilaterally.   No wheezes, crackles, or rhonchi.  Heart:  Regular rate and rhythm;  Without murmur, clicks, rubs or gallops Abdomen:  Soft, nondistended, nontender. Normal bowel sounds. No appreciable masses or hepatomegaly.  No rebound or guarding.  Rectal:  Not performed. Msk:  Symmetrical without gross deformities.   Extremities:  Without edema, cyanosis or clubbing. Neurologic:  Alert and oriented x3;   grossly normal neurologically. Skin:  Intact without significant lesions or rashes. Cervical Nodes:  No significant cervical adenopathy. Psych:  Alert and cooperative. Normal affect.  LAB RESULTS: Recent Labs    07/19/24 1727 07/20/24 0314 07/21/24 0550  WBC 7.3 7.6 11.8*  HGB 10.9* 8.5* 9.4*  HCT 32.0* 25.2* 28.1*  PLT 319 270 272   BMET Recent Labs    07/19/24 1727 07/20/24 0314 07/21/24 0550 07/22/24 0511  NA 135 138 141  --   K 3.4* 3.0* 4.0  --   CL 103 111 111  --   CO2 16* 17* 18*  --   GLUCOSE 93 54* 78  --   BUN 15 16 18   --   CREATININE 4.00* 4.01* 4.12* 3.74*  CALCIUM  9.2 8.4* 8.8*  --    LFT Recent Labs    07/19/24 1727  PROT 7.3  ALBUMIN 4.1  AST 22  ALT 11  ALKPHOS 96  BILITOT 0.6   PT/INR No results for input(s): LABPROT, INR in the last 72 hours.  STUDIES: DG Chest Port 1 View Result Date: 07/21/2024 EXAM: 1 VIEW(S) XRAY OF THE CHEST 07/21/2024 10:50:45 AM COMPARISON: None available. CLINICAL HISTORY: 200808 Hypoxia 200808. FINDINGS: LUNGS AND PLEURA: Coarsened interstitial markings due to emphysema. No interstitial edema. No pleural effusion. No pneumothorax. HEART AND MEDIASTINUM: Atherosclerotic plaque. No acute abnormality of the cardiac and mediastinal silhouettes. BONES AND SOFT TISSUES: Right shoulder arthroplasty noted. Surgical clips in left upper abdomen. Old healed left proximal humerus fracture. IMPRESSION: 1. 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 2. No acute findings. 3. Aortic atherosclerotic calcification. 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 y/o male with diarrhea with a colonoscopy showing acute colitis.  The patient has continued to have diarrhea since having the colonoscopy.  I have spoken to the patient and the patient sister about the possible causes of the diarrhea including postinfectious irritable bowel syndrome or possibly colitis.  Plan:  The patient will be set up for a colonoscopy for tomorrow.  The patient will be given a prep today to get cleaned out.  He will also be started on a clear liquid diet.  The patient and his sister have been explained the plan and agree with it.  Thank you for involving me in the care of this patient.      LOS: 3 days   Edward Copping, MD, MD. NOLIA 07/22/2024, 11:44 AM,  Pager [phone] 7am-5pm  Check AMION for 5pm -7am coverage and on weekends   Note: This dictation was prepared with Dragon dictation along with smaller phrase technology. Any transcriptional errors that result from this process are unintentional.

## 2024-07-22 NOTE — Plan of Care (Signed)

## 2024-07-23 ENCOUNTER — Inpatient Hospital Stay: Admitting: Anesthesiology

## 2024-07-23 ENCOUNTER — Encounter: Admission: EM | Disposition: A | Payer: Self-pay | Source: Home / Self Care | Attending: Internal Medicine

## 2024-07-23 ENCOUNTER — Encounter: Payer: Self-pay | Admitting: Internal Medicine

## 2024-07-23 DIAGNOSIS — K6389 Other specified diseases of intestine: Secondary | ICD-10-CM

## 2024-07-23 DIAGNOSIS — K5730 Diverticulosis of large intestine without perforation or abscess without bleeding: Secondary | ICD-10-CM

## 2024-07-23 DIAGNOSIS — K529 Noninfective gastroenteritis and colitis, unspecified: Secondary | ICD-10-CM | POA: Diagnosis not present

## 2024-07-23 HISTORY — PX: COLONOSCOPY: SHX5424

## 2024-07-23 LAB — CBC
HCT: 29.2 % — ABNORMAL LOW (ref 39.0–52.0)
Hemoglobin: 9.6 g/dL — ABNORMAL LOW (ref 13.0–17.0)
MCH: 33.2 pg (ref 26.0–34.0)
MCHC: 32.9 g/dL (ref 30.0–36.0)
MCV: 101 fL — ABNORMAL HIGH (ref 80.0–100.0)
Platelets: 278 K/uL (ref 150–400)
RBC: 2.89 MIL/uL — ABNORMAL LOW (ref 4.22–5.81)
RDW: 17.7 % — ABNORMAL HIGH (ref 11.5–15.5)
WBC: 10.6 K/uL — ABNORMAL HIGH (ref 4.0–10.5)
nRBC: 1.2 % — ABNORMAL HIGH (ref 0.0–0.2)

## 2024-07-23 LAB — BASIC METABOLIC PANEL WITH GFR
Anion gap: 7 (ref 5–15)
BUN: 17 mg/dL (ref 8–23)
CO2: 20 mmol/L — ABNORMAL LOW (ref 22–32)
Calcium: 8.9 mg/dL (ref 8.9–10.3)
Chloride: 115 mmol/L — ABNORMAL HIGH (ref 98–111)
Creatinine, Ser: 3.09 mg/dL — ABNORMAL HIGH (ref 0.61–1.24)
GFR, Estimated: 21 mL/min — ABNORMAL LOW (ref >60)
Glucose, Bld: 114 mg/dL — ABNORMAL HIGH (ref 70–99)
Potassium: 3.9 mmol/L (ref 3.5–5.1)
Sodium: 142 mmol/L (ref 135–145)

## 2024-07-23 LAB — MAGNESIUM: Magnesium: 1.9 mg/dL (ref 1.7–2.4)

## 2024-07-23 SURGERY — COLONOSCOPY
Anesthesia: General

## 2024-07-23 MED ORDER — GABAPENTIN 100 MG PO CAPS
100.0000 mg | ORAL_CAPSULE | Freq: Every day | ORAL | Status: DC
  Administered 2024-07-23 – 2024-07-24 (×2): 100 mg via ORAL
  Filled 2024-07-23 (×2): qty 1

## 2024-07-23 MED ORDER — LORATADINE 10 MG PO TABS: 10.0000 mg | ORAL_TABLET | Freq: Every day | ORAL | Status: DC | PRN

## 2024-07-23 MED ORDER — SODIUM CHLORIDE 0.9 % IV SOLN: INTRAVENOUS | Status: AC

## 2024-07-23 MED ORDER — LIDOCAINE HCL (CARDIAC) PF 100 MG/5ML IV SOSY
PREFILLED_SYRINGE | INTRAVENOUS | Status: DC | PRN
  Administered 2024-07-23: 60 mg via INTRAVENOUS

## 2024-07-23 MED ORDER — MIDODRINE HCL 5 MG PO TABS
5.0000 mg | ORAL_TABLET | Freq: Three times a day (TID) | ORAL | Status: DC
  Administered 2024-07-23 – 2024-07-25 (×7): 5 mg via ORAL
  Filled 2024-07-23 (×7): qty 1

## 2024-07-23 MED ORDER — TAMSULOSIN HCL 0.4 MG PO CAPS
0.4000 mg | ORAL_CAPSULE | Freq: Every day | ORAL | Status: DC
  Administered 2024-07-24 – 2024-07-25 (×2): 0.4 mg via ORAL
  Filled 2024-07-23 (×2): qty 1

## 2024-07-23 MED ORDER — ONDANSETRON HCL 4 MG/2ML IJ SOLN
INTRAMUSCULAR | Status: DC | PRN
  Administered 2024-07-23: 4 mg via INTRAVENOUS

## 2024-07-23 MED ORDER — CHLORHEXIDINE GLUCONATE CLOTH 2 % EX PADS
6.0000 | MEDICATED_PAD | Freq: Every day | CUTANEOUS | Status: DC
Start: 2024-07-23 — End: 2024-07-25
  Administered 2024-07-23 – 2024-07-24 (×2): 6 via TOPICAL

## 2024-07-23 MED ORDER — FERROUS SULFATE 325 (65 FE) MG PO TABS
325.0000 mg | ORAL_TABLET | ORAL | Status: DC
  Administered 2024-07-24: 325 mg via ORAL
  Filled 2024-07-23 (×2): qty 1

## 2024-07-23 MED ORDER — PROPOFOL 10 MG/ML IV BOLUS
INTRAVENOUS | Status: DC | PRN
  Administered 2024-07-23: 40 mg via INTRAVENOUS
  Administered 2024-07-23: 20 mg via INTRAVENOUS
  Administered 2024-07-23: 40 mg via INTRAVENOUS

## 2024-07-23 MED ORDER — ONDANSETRON HCL 4 MG/2ML IJ SOLN
INTRAMUSCULAR | Status: AC
  Filled 2024-07-23: qty 2

## 2024-07-23 MED ORDER — SODIUM CHLORIDE 0.9 % IV SOLN: INTRAVENOUS | Status: DC

## 2024-07-23 MED ORDER — PROPOFOL 500 MG/50ML IV EMUL
INTRAVENOUS | Status: DC | PRN
  Administered 2024-07-23: 50 ug/kg/min via INTRAVENOUS

## 2024-07-23 NOTE — Op Note (Signed)
 [facility] Gastroenterology Patient Name: Edward Singleton Procedure Date: 07/23/2024 2:03 PM MRN: [id number] Account #: [id number] Date of Birth: September 24, 1951 Admit Type: Outpatient Age: 73 Room: [facility] ENDO ROOM 2 Gender: Male Note Status: Finalized Instrument Name: Colon Scope [id number] Procedure:             Colonoscopy Indications:           Chronic diarrhea Providers:             [name] MD, MD Medicines:             Propofol  per Anesthesia Complications:         No immediate complications. Procedure:             Pre-Anesthesia Assessment:                        - Prior to the procedure, a History and Physical was                         performed, and patient medications and allergies were                         reviewed. The patient's tolerance of previous                         anesthesia was also reviewed. The risks and benefits                         of the procedure and the sedation options and risks                         were discussed with the patient. All questions were                         answered, and informed consent was obtained. Prior                         Anticoagulants: The patient has taken no anticoagulant                         or antiplatelet agents. ASA Grade Assessment: II - A                         patient with mild systemic disease. After reviewing                         the risks and benefits, the patient was deemed in                         satisfactory condition to undergo the procedure.                        After obtaining informed consent, the colonoscope was                         passed under direct vision. Throughout the procedure,                         the patient's blood pressure, pulse,  and oxygen                         saturations were monitored continuously. The                         Colonoscope was introduced through the anus and                         advanced to the the terminal ileum. The  colonoscopy                         was performed without difficulty. The patient                         tolerated the procedure well. The quality of the bowel                         preparation was adequate to identify polyps. Findings:      The perianal and digital rectal examinations were normal.      The terminal ileum appeared normal. Biopsies were taken with a cold       forceps for histology.      A diffuse area of mildly erythematous mucosa was found in the transverse       colon, in the ascending colon and in the cecum. This was biopsied with a       cold forceps for histology.      Multiple small-mouthed diverticula were found in the sigmoid colon and       descending colon.      Biopsies were taken with a cold forceps in the sigmoid colon and in the       descending colon for histology. Impression:            - The examined portion of the ileum was normal.                         Biopsied.                        - Erythematous mucosa in the transverse colon, in the                         ascending colon and in the cecum. Biopsied.                        - Diverticulosis in the sigmoid colon and in the                         descending colon.                        - Biopsies were taken with a cold forceps for                         histology in the sigmoid colon and in the descending                         colon. Recommendation:        - Return patient to hospital ward for ongoing care.                        -  Resume previous diet.                        - Continue present medications.                        - Await pathology results. Procedure Code(s):     --- Professional ---                        [phone], Colonoscopy, flexible; with biopsy, single or                         multiple Diagnosis Code(s):     --- Professional ---                        K52.9, Noninfective gastroenteritis and colitis,                         unspecified                        K63.89,  Other specified diseases of intestine CPT copyright 2022 American Medical Association. All rights reserved. The codes documented in this report are preliminary and upon coder review may  be revised to meet current compliance requirements. [name] MD, MD 07/23/2024 2:32:11 PM This report has been signed electronically. Number of Addenda: 0 Note Initiated On: 07/23/2024 2:03 PM Scope Withdrawal Time: 0 hours 5 minutes 17 seconds  Total Procedure Duration: 0 hours 13 minutes 6 seconds  Estimated Blood Loss:  Estimated blood loss: none.      [facility]

## 2024-07-23 NOTE — Progress Notes (Signed)
 Patient was yellow MEWs in procedure but green when he got back to the floor.

## 2024-07-23 NOTE — Anesthesia Preprocedure Evaluation (Addendum)
 Anesthesia Evaluation  Patient identified by MRN, date of birth, ID band Patient awake    Reviewed: Allergy & Precautions, NPO status , Patient's Chart, lab work & pertinent test results  Airway Mallampati: II  TM Distance: >3 FB Neck ROM: full    Dental  (+) Edentulous Upper, Missing, Loose, Poor Dentition, Dental Advisory Given   Pulmonary COPD,  COPD inhaler, Current Smoker and Patient abstained from smoking.   Pulmonary exam normal  + decreased breath sounds      Cardiovascular Exercise Tolerance: Poor hypertension, Pt. on medications + CAD and + Peripheral Vascular Disease  Normal cardiovascular exam Rhythm:Regular Rate:Normal     Neuro/Psych  Headaches  Anxiety    Dementia negative neurological ROS  negative psych ROS   GI/Hepatic negative GI ROS,,,(+)     substance abuse  alcohol  use  Endo/Other  diabetes, Type 2, Oral Hypoglycemic Agents    Renal/GU negative Renal ROS  negative genitourinary   Musculoskeletal   Abdominal Normal abdominal exam  (+)   Peds negative pediatric ROS (+)  Hematology negative hematology ROS (+) Blood dyscrasia, anemia   Anesthesia Other Findings Past Medical History: No date: Alcohol  abuse No date: Atrophic kidney No date: Benign enlargement of prostate No date: Coronary artery disease No date: Dementia (HCC)     Comment:  due to alcohol  No date: Diabetes mellitus without complication (HCC) No date: Gout No date: Gout No date: Headache No date: Hypercholesteremia No date: Hypertension No date: Kidney stones No date: Left flank pain, chronic 11/17/2014: Pelvic fracture (HCC) No date: Pneumonia No date: Prolonged QT syndrome 05/11/2022: Septic arthritis of shoulder, right (HCC) No date: White matter disease  Past Surgical History: 07/06/2022: COLONOSCOPY WITH PROPOFOL ; N/A     Comment:  Procedure: COLONOSCOPY WITH PROPOFOL ;  Surgeon: Therisa Bi, MD;   Location: [facility] ENDOSCOPY;  Service:               Gastroenterology;  Laterality: N/A; 01/10/2024: COLONOSCOPY WITH PROPOFOL ; N/A     Comment:  Procedure: COLONOSCOPY WITH PROPOFOL ;  Surgeon: Therisa Bi, MD;  Location: [facility] ENDOSCOPY;  Service:               Gastroenterology;  Laterality: N/A; 03/18/2019: ESOPHAGOGASTRODUODENOSCOPY; Left     Comment:  Procedure: ESOPHAGOGASTRODUODENOSCOPY (EGD);  Surgeon:               Therisa Bi, MD;  Location: [facility] ENDOSCOPY;  Service:               Gastroenterology;  Laterality: Left; 07/06/2022: ESOPHAGOGASTRODUODENOSCOPY; N/A     Comment:  Procedure: ESOPHAGOGASTRODUODENOSCOPY (EGD);  Surgeon:               Therisa Bi, MD;  Location: [facility] ENDOSCOPY;  Service:               Gastroenterology;  Laterality: N/A; 01/10/2024: ESOPHAGOGASTRODUODENOSCOPY (EGD) WITH PROPOFOL ; N/A     Comment:  Procedure: ESOPHAGOGASTRODUODENOSCOPY (EGD) WITH               PROPOFOL ;  Surgeon: Therisa Bi, MD;  Location: [facility]               ENDOSCOPY;  Service: Gastroenterology;  Laterality: N/A; No date: FRACTURE SURGERY 10/11/2011: left renal stent placement No date: LITHOTRIPSY 11/18/2014: ORIF HUMERUS FRACTURE; Left     Comment:  Procedure: OPEN REDUCTION INTERNAL FIXATION (ORIF)               DISTAL HUMERUS FRACTURE;  Surgeon: Ozell VEAR Bruch, MD;                Location: [facility] OR;  Service: Orthopedics;  Laterality:               Left; 10/27/2021: REVERSE SHOULDER ARTHROPLASTY; Right     Comment:  Procedure: REVERSE SHOULDER ARTHROPLASTY;  Surgeon:               Cristy Bonner DASEN, MD;  Location: [facility] OR;  Service:               Orthopedics;  Laterality: Right; 05/11/2022: REVISION TOTAL SHOULDER TO REVERSE TOTAL SHOULDER; Right     Comment:  Procedure: REVISION TOTAL SHOULDER TO REVERSE TOTAL               SHOULDER;  Surgeon: Cristy Bonner DASEN, MD;  Location: [facility] ORS;              Service: Orthopedics;  Laterality: Right; No date: SPLENECTOMY  BMI    Body Mass Index:  21.60 kg/m      Reproductive/Obstetrics negative OB ROS                              Anesthesia Physical Anesthesia Plan  ASA: 3  Anesthesia Plan: General   Post-op Pain Management:    Induction: Intravenous  PONV Risk Score and Plan: Propofol  infusion and TIVA  Airway Management Planned: Natural Airway and Nasal Cannula  Additional Equipment:   Intra-op Plan:   Post-operative Plan:   Informed Consent: I have reviewed the patients History and Physical, chart, labs and discussed the procedure including the risks, benefits and alternatives for the proposed anesthesia with the patient or authorized representative who has indicated his/her understanding and acceptance.     Dental Advisory Given  Plan Discussed with: CRNA  Anesthesia Plan Comments:          Anesthesia Quick Evaluation

## 2024-07-23 NOTE — Progress Notes (Addendum)
 Mobility Specialist - Progress Note     07/23/24 0954  Mobility  Activity Ambulated independently;Stood at bedside;Pivoted/transferred to/from [facility]  Level of Assistance Standby assist, set-up cues, supervision of patient - no hands on  Assistive Device None  Distance Ambulated (ft) 3 ft  Range of Motion/Exercises Active  Activity Response Tolerated well  Mobility Referral Yes  Mobility visit 1 Mobility  Mobility Specialist Start Time (ACUTE ONLY) 0912  Mobility Specialist Stop Time (ACUTE ONLY) [id number]  Mobility Specialist Time Calculation (min) (ACUTE ONLY) 9 min   Pt resting in bed on 2L upon entry. Pt STS and transfers in room to [facility] quickly SBA with no AD. Pt returned to bed and left with needs in reach. Bed alarm activated.   Guido Rumble Mobility Specialist 07/23/24, 11:02 AM

## 2024-07-23 NOTE — Progress Notes (Signed)
 [facility]  ROUNDING NOTE   Subjective:   Patient seen sitting at side of bed Alert and oriented Tolerating meals without nausea Room air  Creatinine 3.09  Objective:  Vital signs in last 24 hours:  Temp:  [97.4 F (36.3 C)-98.5 F (36.9 C)] 97.5 F (36.4 C) (10/14 0744) Pulse Rate:  [72-87] 87 (10/14 0901) Resp:  [16-18] 16 (10/14 0744) BP: (99-161)/(64-88) 108/70 (10/14 0901) SpO2:  [94 %-100 %] 98 % (10/14 0901)  Weight change:  There were no vitals filed for this visit.  Intake/Output: I/O last 3 completed shifts: In: 1703.4 [P.O.:240; I.V.:1463.4] Out: 500 [Urine:500]   Intake/Output this shift:  No intake/output data recorded.  Physical Exam: General: NAD  Head: Normocephalic, atraumatic. Moist oral mucosal membranes  Eyes: Anicteric  Lungs:  Clear to auscultation, normal effort  Heart: Regular rate and rhythm  Abdomen:  Soft, nontender  Extremities:  No peripheral edema.  Neurologic: Awake, alert, conversant  Skin: Warm,dry, no rash       Basic Metabolic Panel: Recent Labs  Lab 07/19/24 1727 07/19/24 2107 07/20/24 0314 07/21/24 0550 07/22/24 0511 07/23/24 0547  NA 135  --  138 141  --  142  K 3.4*  --  3.0* 4.0  --  3.9  CL 103  --  111 111  --  115*  CO2 16*  --  17* 18*  --  20*  GLUCOSE 93  --  54* 78  --  114*  BUN 15  --  16 18  --  17  CREATININE 4.00*  --  4.01* 4.12* 3.74* 3.09*  CALCIUM  9.2  --  8.4* 8.8*  --  8.9  MG  --  1.8  --  2.2  --  1.9    Liver Function Tests: Recent Labs  Lab 07/19/24 1727  AST 22  ALT 11  ALKPHOS 96  BILITOT 0.6  PROT 7.3  ALBUMIN 4.1   Recent Labs  Lab 07/19/24 1727  LIPASE 58*   No results for input(s): AMMONIA in the last 168 hours.  CBC: Recent Labs  Lab 07/19/24 1727 07/20/24 0314 07/21/24 0550 07/23/24 0547  WBC 7.3 7.6 11.8* 10.6*  HGB 10.9* 8.5* 9.4* 9.6*  HCT 32.0* 25.2* 28.1* 29.2*  MCV 100.6* 100.4* 101.8* 101.0*  PLT 319 270 272 278    Cardiac  Enzymes: No results for input(s): CKTOTAL, CKMB, CKMBINDEX, TROPONINI in the last 168 hours.  BNP: Invalid input(s): POCBNP  CBG: Recent Labs  Lab 07/21/24 0749 07/21/24 1214 07/21/24 2032 07/22/24 0749 07/22/24 1132  GLUCAP 74 94 78 144* 151*    Microbiology: Results for orders placed or performed during the hospital encounter of 07/19/24  Gastrointestinal Panel by PCR , Stool     Status: None   Collection Time: 07/19/24  8:15 PM   Specimen: Stool  Result Value Ref Range Status   Campylobacter species NOT DETECTED NOT DETECTED Final   Plesimonas shigelloides NOT DETECTED NOT DETECTED Final   Salmonella species NOT DETECTED NOT DETECTED Final   Yersinia enterocolitica NOT DETECTED NOT DETECTED Final   Vibrio species NOT DETECTED NOT DETECTED Final   Vibrio cholerae NOT DETECTED NOT DETECTED Final   Enteroaggregative E coli (EAEC) NOT DETECTED NOT DETECTED Final   Enteropathogenic E coli (EPEC) NOT DETECTED NOT DETECTED Final   Enterotoxigenic E coli (ETEC) NOT DETECTED NOT DETECTED Final   Shiga like toxin producing E coli (STEC) NOT DETECTED NOT DETECTED Final   Shigella/Enteroinvasive E coli (EIEC)  NOT DETECTED NOT DETECTED Final   Cryptosporidium NOT DETECTED NOT DETECTED Final   Cyclospora cayetanensis NOT DETECTED NOT DETECTED Final   Entamoeba histolytica NOT DETECTED NOT DETECTED Final   Giardia lamblia NOT DETECTED NOT DETECTED Final   Adenovirus F40/41 NOT DETECTED NOT DETECTED Final   Astrovirus NOT DETECTED NOT DETECTED Final   Norovirus GI/GII NOT DETECTED NOT DETECTED Final   Rotavirus A NOT DETECTED NOT DETECTED Final   Sapovirus (I, II, IV, and V) NOT DETECTED NOT DETECTED Final    Comment: Performed at [facility], [address] [place], [place] [address]  Stool culture     Status: None   Collection Time: 07/19/24  8:15 PM   Specimen: Stool  Result Value Ref Range Status   Salmonella/Shigella Screen WRORD  Final    Comment:  (NOTE) Test not performed. The required specimen for the test ordered was not received. Received: P/P White Requires: P/P Orange Notified Stephanie H. 07/22/2024    Campylobacter Culture WRORD  Final    Comment: (NOTE) Test not performed. The required specimen for the test ordered was not received. Received: P/P White Requires: P/P [employer]. 07/22/2024    E coli, Shiga toxin Assay WRORD  Final    Comment: (NOTE) Test not performed. The required specimen for the test ordered was not received. Received: P/P White Requires: P/P [employer]. 07/22/2024 Performed At: [facility] [address] [place], [place] [id number] Jennette Shorter MD Ey:[phone]   C Difficile Quick Screen w PCR reflex     Status: None   Collection Time: 07/20/24 11:55 AM   Specimen: STOOL  Result Value Ref Range Status   C Diff antigen NEGATIVE NEGATIVE Final   C Diff toxin NEGATIVE NEGATIVE Final   C Diff interpretation No C. difficile detected.  Final    Comment: Performed at [facility], [address] [place], [place] [address]    Coagulation Studies: No results for input(s): LABPROT, INR in the last 72 hours.  Urinalysis: No results for input(s): COLORURINE, LABSPEC, PHURINE, GLUCOSEU, HGBUR, BILIRUBINUR, KETONESUR, PROTEINUR, UROBILINOGEN, NITRITE, LEUKOCYTESUR in the last 72 hours.  Invalid input(s): APPERANCEUR    Imaging: No results found.   Medications:    sodium chloride       Chlorhexidine  Gluconate Cloth  6 each Topical Daily   enoxaparin  (LOVENOX ) injection  30 mg Subcutaneous Q24H   ipratropium-albuterol   3 mL Nebulization BID   midodrine   5 mg Oral TID WC   nicotine   14 mg Transdermal Daily   rosuvastatin   20 mg Oral Daily   acetaminophen  **OR** acetaminophen , loperamide  Assessment/ Plan:  Edward Singleton is a 73 y.o.  male with past medical history of hypertension, hyperlipidemia,  gout, GERD and chronic kidney disease IIIb, who was admitted to [facility] on 07/19/2024 for Dehydration [E86.0] Severe diarrhea [K52.9] AKI (acute kidney injury) [N17.9]   Acute kidney injury on chronic kidney disease stage IIIb. Baseline creatinine 1.69 with GFR 43 on 08/27/23. Renal US  negative for obstruction but shows some bilateral parenchymal atrophy. Acute kidney injury likely secondary to GI losses with poor solute intake.  Creatinine 4 on ED arrival.  Creatinine continues to improve with IVF and oral intake. Continue supportive measures.   Lab Results  Component Value Date   CREATININE 3.09 (H) 07/23/2024   CREATININE 3.74 (H) 07/22/2024   CREATININE 4.12 (H) 07/21/2024    Intake/Output Summary (Last 24 hours) at 07/23/2024 1158 Last data filed at  07/22/2024 1822 Gross per 24 hour  Intake 1040.41 ml  Output 500 ml  Net 540.41 ml    2. Anemia of chronic kidney disease Lab Results  Component Value Date   HGB 9.6 (L) 07/23/2024    Hgb acceptable. Will continue to monitor.   3.  Hypotension with history of hypertension with chronic kidney disease.  Home regimen includes metoprolol , currently held during this admission.  Currently prescribed midodrine  10 mg 3 times daily. Blood pressure stable   4. Diabetes mellitus type II with chronic kidney disease/renal manifestations: noninsulin dependent. Home regimen includes metformin . Most recent hemoglobin A1c is 5.8 on 10/12/2022.   Glucose well controlled by primary team   LOS: 4 Jaccob Czaplicki 10/14/202511:58 AM

## 2024-07-23 NOTE — Transfer of Care (Signed)
 Immediate Anesthesia Transfer of Care Note  Patient: Edward Singleton  Procedure(s) Performed: COLONOSCOPY  Patient Location: PACU  Anesthesia Type:General  Level of Consciousness: sedated  Airway & Oxygen Therapy: Patient Spontanous Breathing  Post-op Assessment: Report given to RN and Post -op Vital signs reviewed and stable  Post vital signs: Reviewed and stable  Last Vitals:  Vitals Value Taken Time  BP 84/42 07/23/24 14:37  Temp    Pulse 82 07/23/24 14:37  Resp 22 07/23/24 14:37  SpO2 97 % 07/23/24 14:37  Vitals shown include unfiled device data.  Last Pain:  Vitals:   07/23/24 1338  TempSrc: Temporal  PainSc: 0-No pain         Complications: No notable events documented.

## 2024-07-23 NOTE — Progress Notes (Signed)
  PROGRESS NOTE    Edward Singleton  FMW:[id number] DOB: 03-28-1951 DOA: 07/19/2024 PCP: Milissa Savant, MD  114A/114A-AA  LOS: 4 days   Brief hospital course:   Assessment & Plan: Edward Singleton is a 73 y.o. year old male with past medical history of hypertension, hyperlipidemia, CKD 3B, gout, GERD and MDD presenting to the ED with chronic diarrhea.  Patient reports he had over 10 pound weight loss in the last 6 months.  Recently his diarrhea has worsened where he is having 10-15 episodes daily.  Diarrhea is nonbloody.  The diarrhea is causing significant concern to the patient to the point where he has decreased his p.o. intake.  Patient reports nocturnal episodes of incontinence.    Chronic diarrhea --had colonoscopy on 01/10/24 which showed some colitis. --C diff and GI path neg --Imodium PRN --cont MIVF --colonoscopy today  AKI --Cr 4.0 on presentation.  Was 1.69 back in Nov 2024.  Thought to be due to dehydration for diarrhea.  Pt has been given MIVF and Cr finally improved today to 3.09. --nephro consulted --cont MIVF  Acute hypoxemic respiratory failure --developed overnight, 83% on room air, was placed on 3L.   --CXR showed only COPD changes.  No sign of fluid overload or infection. --cont DuoNeb --Continue supplemental O2 to keep sats >=90%, wean as tolerated  Hypotension --asymptomatic.   --250 ml bolus for systolic <90 --cont midodrine  (new)  Acute urinary retention --post void >600 ml x2 in 1 day --Insert Foley today --start Flomax   Hypokalemia --monitor and supplement PRN   Skin Lesions:  Pts skin lesions have concerning features. Pt would benefit from one time OP dermatology visit. Will defer this to his PCP.    Chronic Problems: Gout: holding home allopurinol .  HLD: continue home statin T2DM: holding home metformin .  HTN:  Hold Toprol  due to hypotension   DVT prophylaxis: Lovenox  SQ Code Status: DNR  Family Communication: sister updated at bedside  today Level of care: Med-Surg Dispo:   The patient is from: home Anticipated d/c is to: home Anticipated d/c date is: 1-2 days   Subjective and Interval History:  Pt reported breathing improved.  Had another episode of urinary retention, Foley inserted.  Underwent colonoscopy today.   Objective: Vitals:   07/23/24 1527 07/23/24 1611 07/23/24 1929 07/23/24 2020  BP: (!) 108/54 (!) 110/58 119/73   Pulse: 75 75 92   Resp: (!) 22 16 19    Temp:  99.6 F (37.6 C) 98.6 F (37 C)   TempSrc:      SpO2: 98% 95% 96% 92%  Height:        Intake/Output Summary (Last 24 hours) at 07/23/2024 2142 Last data filed at 07/23/2024 1930 Gross per 24 hour  Intake 200 ml  Output 2275 ml  Net -2075 ml   There were no vitals filed for this visit.  Examination:   Constitutional: NAD, AAOx3 HEENT: conjunctivae and lids normal, EOMI CV: No cyanosis.   RESP: normal respiratory effort, on 2L Neuro: II - XII grossly intact.   Psych: Normal mood and affect.  Appropriate judgement and reason   Data Reviewed: I have personally reviewed labs and imaging studies  Time spent: 50 minutes  Ellouise Haber, MD [facility] If 7PM-7AM, please contact night-coverage 07/23/2024, 9:42 PM

## 2024-07-24 DIAGNOSIS — F1027 Alcohol dependence with alcohol-induced persisting dementia: Secondary | ICD-10-CM

## 2024-07-24 DIAGNOSIS — K529 Noninfective gastroenteritis and colitis, unspecified: Secondary | ICD-10-CM | POA: Diagnosis not present

## 2024-07-24 DIAGNOSIS — I739 Peripheral vascular disease, unspecified: Secondary | ICD-10-CM

## 2024-07-24 DIAGNOSIS — I25118 Atherosclerotic heart disease of native coronary artery with other forms of angina pectoris: Secondary | ICD-10-CM

## 2024-07-24 DIAGNOSIS — K6389 Other specified diseases of intestine: Secondary | ICD-10-CM

## 2024-07-24 DIAGNOSIS — I10 Essential (primary) hypertension: Secondary | ICD-10-CM

## 2024-07-24 LAB — BASIC METABOLIC PANEL WITH GFR
Anion gap: 11 (ref 5–15)
BUN: 16 mg/dL (ref 8–23)
CO2: 21 mmol/L — ABNORMAL LOW (ref 22–32)
Calcium: 8 mg/dL — ABNORMAL LOW (ref 8.9–10.3)
Chloride: 108 mmol/L (ref 98–111)
Creatinine, Ser: 2.72 mg/dL — ABNORMAL HIGH (ref 0.61–1.24)
GFR, Estimated: 24 mL/min — ABNORMAL LOW (ref >60)
Glucose, Bld: 149 mg/dL — ABNORMAL HIGH (ref 70–99)
Potassium: 2.9 mmol/L — ABNORMAL LOW (ref 3.5–5.1)
Sodium: 140 mmol/L (ref 135–145)

## 2024-07-24 LAB — SURGICAL PATHOLOGY

## 2024-07-24 MED ORDER — IPRATROPIUM-ALBUTEROL 0.5-2.5 (3) MG/3ML IN SOLN
3.0000 mL | Freq: Four times a day (QID) | RESPIRATORY_TRACT | Status: DC | PRN
  Administered 2024-07-25: 3 mL via RESPIRATORY_TRACT
  Filled 2024-07-24: qty 3

## 2024-07-24 MED ORDER — ENSURE PLUS HIGH PROTEIN PO LIQD
237.0000 mL | Freq: Two times a day (BID) | ORAL | Status: DC
Start: 2024-07-24 — End: 2024-07-25
  Administered 2024-07-24 (×2): 237 mL via ORAL

## 2024-07-24 MED ORDER — LOPERAMIDE HCL 2 MG PO CAPS
2.0000 mg | ORAL_CAPSULE | Freq: Two times a day (BID) | ORAL | Status: DC
  Administered 2024-07-24 – 2024-07-25 (×2): 2 mg via ORAL
  Filled 2024-07-24 (×2): qty 1

## 2024-07-24 MED ORDER — FAMOTIDINE 20 MG PO TABS
20.0000 mg | ORAL_TABLET | Freq: Two times a day (BID) | ORAL | Status: DC | PRN
  Administered 2024-07-24: 20 mg via ORAL
  Filled 2024-07-24: qty 1

## 2024-07-24 MED ORDER — POTASSIUM CHLORIDE 20 MEQ PO PACK
40.0000 meq | PACK | Freq: Two times a day (BID) | ORAL | Status: AC
  Administered 2024-07-24 – 2024-07-25 (×2): 40 meq via ORAL
  Filled 2024-07-24 (×2): qty 2

## 2024-07-24 NOTE — Progress Notes (Signed)
 PROGRESS NOTE    Edward Singleton  FMW:[id number] DOB: 28-Apr-1951 DOA: 07/19/2024 PCP: Milissa Savant, MD  Chief Complaint  Patient presents with   Diarrhea         Hospital Course:  Edward Singleton is a 73 year old male with hypertension, hyperlipidemia, CKD stage IIIb, gout, GERD, MDD, who presents with chronic diarrhea.  Patient reports a 10 pound weight loss over the last 6 months and endorses diarrhea has become 10-15 times daily.  Patient underwent colonoscopy on 01/10/2024 which revealed some colitis.  C. difficile and GI pathogen negative.  GI was consulted on this admission and patient underwent unremarkable colonoscopy on 10/14.  GI suggest this is secondary to IBS and has recommended scheduled Imodium with titration to reasonable bowel movements.  Subjective: This morning patient reports he is having semi-solid stools.  He is still endorsing some dyspnea on exertion and also having some acid reflux.   Objective: Vitals:   07/24/24 0449 07/24/24 0726 07/24/24 0820 07/24/24 1543  BP: 103/65  108/65 113/71  Pulse: 78  90 (!) 102  Resp: 18  20 20   Temp: 98.9 F (37.2 C)  98.2 F (36.8 C) 97.8 F (36.6 C)  TempSrc:      SpO2: 94% 92% 93% 98%  Height:        Intake/Output Summary (Last 24 hours) at 07/24/2024 1707 Last data filed at 07/24/2024 1300 Gross per 24 hour  Intake 1726.19 ml  Output 5100 ml  Net -3373.81 ml   There were no vitals filed for this visit.  Examination: General exam: Appears calm and comfortable, NAD  Respiratory system: Mild tachypnea, no wheezing, on 3 L [place] Cardiovascular system: S1 & S2 heard, RRR.  Gastrointestinal system: Abdomen is nondistended, soft and nontender.   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 - GI recommends Imodium titration - C. difficile and GI pathogens negative  AKI - Baseline creatinine around 1.69, creatinine 4.0 on arrival - Nephrology has been consulted - Continue maintenance IV fluids  Acute hypoxic respiratory failure - Still experiencing significant dyspnea on exertion, desaturating to 83% on room air, currently on 2 L - CXR with COPD changes.  No infection or fluid - No wheezing appreciated to necessitate steroid therapy - Potential that patient has chronic respiratory failure which was undiagnosed.  He receives his care at the [facility] so record review is limited.  - Continue with DuoNebs - Continue supplemental O2 and wean as tolerated.  Hypotension - Status post fluid bolus - Midodrine  added, will continue and taper as able  Acute urinary retention - Postvoid residual over 600 mL on multiple occasions - Foley catheter inserted 10/14 - Continue Flomax  - Trial of void in a.m.  Hypokalemia - Secondary to diarrhea - Monitor and supplement as needed  Skin lesions - Needs close outpatient follow-up with dermatology.  Will place referral at DC.  Patient receives [facility] at [facility]  Gout - Hold home dose allopurinol   given AKI  Hyperlipidemia - Continue statin  Type 2 diabetes - Hold metformin  in light of AKI.  CBGs are stable  Hypertension - Hypotensive on arrival.  Hold antihypertensives for now.  Resume as able  DVT prophylaxis: Lovenox    Code Status: Limited: Do not attempt resuscitation (DNR) -DNR-LIMITED -Do Not Intubate/DNI  Disposition: Inpatient pending clinical improvement  Consultants:  Treatment Team:  Consulting  Physician: Jinny Carmine, MD  Procedures:  Colonoscopy 10/14  Antimicrobials:  Anti-infectives (From admission, onward)    None       Data Reviewed: I have personally reviewed following labs and imaging studies CBC: Recent Labs  Lab 07/19/24 1727 07/20/24 0314 07/21/24 0550 07/23/24 0547  WBC 7.3 7.6 11.8* 10.6*  HGB 10.9* 8.5* 9.4* 9.6*  HCT 32.0* 25.2* 28.1* 29.2*  MCV 100.6* 100.4* 101.8* 101.0*  PLT 319 270 272 278   Basic Metabolic Panel: Recent Labs  Lab 07/19/24 1727 07/19/24 2107 07/20/24 0314 07/21/24 0550 07/22/24 0511 07/23/24 0547 07/24/24 1111  NA 135  --  138 141  --  142 140  K 3.4*  --  3.0* 4.0  --  3.9 2.9*  CL 103  --  111 111  --  115* 108  CO2 16*  --  17* 18*  --  20* 21*  GLUCOSE 93  --  54* 78  --  114* 149*  BUN 15  --  16 18  --  17 16  CREATININE 4.00*  --  4.01* 4.12* 3.74* 3.09* 2.72*  CALCIUM  9.2  --  8.4* 8.8*  --  8.9 8.0*  MG  --  1.8  --  2.2  --  1.9  --    GFR: CrCl cannot be calculated (Unknown ideal weight.). Liver Function Tests: Recent Labs  Lab 07/19/24 1727  AST 22  ALT 11  ALKPHOS 96  BILITOT 0.6  PROT 7.3  ALBUMIN 4.1   CBG: Recent Labs  Lab 07/21/24 0749 07/21/24 1214 07/21/24 2032 07/22/24 0749 07/22/24 1132  GLUCAP 74 94 78 144* 151*    Recent Results (from the past 240 hours)  Gastrointestinal Panel by PCR , Stool     Status: None   Collection Time: 07/19/24  8:15 PM   Specimen: Stool  Result Value Ref Range Status   Campylobacter species NOT DETECTED NOT DETECTED Final   Plesimonas shigelloides NOT DETECTED NOT DETECTED Final   Salmonella species NOT DETECTED NOT DETECTED Final   Yersinia enterocolitica NOT DETECTED NOT DETECTED Final   Vibrio species NOT DETECTED NOT DETECTED Final   Vibrio cholerae NOT DETECTED NOT DETECTED Final   Enteroaggregative E coli (EAEC) NOT DETECTED NOT DETECTED Final   Enteropathogenic E coli (EPEC) NOT DETECTED NOT DETECTED Final   Enterotoxigenic E coli  (ETEC) NOT DETECTED NOT DETECTED Final   Shiga like toxin producing E coli (STEC) NOT DETECTED NOT DETECTED Final   Shigella/Enteroinvasive E coli (EIEC) NOT DETECTED NOT DETECTED Final   Cryptosporidium NOT DETECTED NOT DETECTED Final   Cyclospora cayetanensis NOT DETECTED NOT DETECTED Final   Entamoeba histolytica NOT DETECTED NOT DETECTED Final   Giardia lamblia NOT DETECTED NOT DETECTED Final   Adenovirus F40/41 NOT DETECTED NOT DETECTED Final   Astrovirus NOT DETECTED NOT DETECTED Final   Norovirus GI/GII NOT DETECTED NOT DETECTED Final   Rotavirus A NOT DETECTED NOT DETECTED Final   Sapovirus (I, II, IV, and V) NOT DETECTED NOT DETECTED Final    Comment: Performed at [facility], [address] [place], [place] [address]  Stool culture     Status: None   Collection Time: 07/19/24  8:15 PM   Specimen: Stool  Result Value Ref Range Status   Salmonella/Shigella Screen WRORD  Final    Comment: (NOTE) Test not performed. The required specimen for the test ordered was not received. Received: P/P White Requires: P/P Orange Notified Stephanie H. 07/22/2024    Campylobacter Culture WRORD  Final    Comment: (NOTE) Test not performed. The required specimen for the test ordered was not received. Received: P/P White Requires: P/P [employer]. 07/22/2024    E coli, Shiga toxin Assay WRORD  Final    Comment: (NOTE) Test not performed. The required specimen for the test ordered was not received. Received: P/P White Requires: P/P [employer]. 07/22/2024 Performed At: [facility] [address] [place], [place] [id number] Jennette Shorter MD Ey:[phone]   C Difficile Quick Screen w PCR reflex     Status: None   Collection Time: 07/20/24 11:55 AM   Specimen: STOOL  Result Value Ref Range Status   C Diff antigen NEGATIVE NEGATIVE Final   C Diff toxin NEGATIVE NEGATIVE Final   C Diff interpretation No C. difficile detected.   Final    Comment: Performed at [facility], [address], [place], [place] [address]     Radiology Studies: No results found.  Scheduled Meds:  Chlorhexidine  Gluconate Cloth  6 each Topical Daily   enoxaparin  (LOVENOX ) injection  30 mg Subcutaneous Q24H   feeding supplement  237 mL Oral BID BM   ferrous sulfate  325 mg Oral Q M,W,F   gabapentin   100 mg Oral QHS   midodrine   5 mg Oral TID WC   nicotine   14 mg Transdermal Daily   rosuvastatin   20 mg Oral Daily   tamsulosin   0.4 mg Oral Daily   Continuous Infusions:   LOS: 5 days  MDM: Patient is high risk for one or more organ failure.  They necessitate ongoing hospitalization for continued IV therapies and subsequent lab monitoring. Total time spent interpreting labs and vitals, reviewing the medical record, coordinating care amongst consultants and care team members, directly assessing and discussing care with the patient and/or family: 55 min  Akeiba Axelson, DO [facility]  To contact the attending physician between 7A-7P please use Epic Chat. To contact the covering physician during after hours 7P-7A, please review Amion.  07/24/2024, 5:07 PM   *This document has been created with the assistance of dictation software. Please excuse typographical errors. *

## 2024-07-24 NOTE — Progress Notes (Signed)
 Mobility Specialist - Progress Note  Pre-mobility:  SpO2-98%   During mobility:  SpO2-88%  Post-mobility:  SPO2-95%   07/24/24 1100  Oxygen Therapy  O2 Device Nasal Cannula  O2 Flow Rate (L/min) 4 L/min  Mobility  Activity Ambulated with assistance  Level of Assistance Independent after set-up  Assistive Device  (IV stand and O2 tank)  Distance Ambulated (ft) 30 ft  Range of Motion/Exercises Active  Activity Response Tolerated fair  Mobility visit 1 Mobility  Mobility Specialist Start Time (ACUTE ONLY) 1009  Mobility Specialist Stop Time (ACUTE ONLY) 1024  Mobility Specialist Time Calculation (min) (ACUTE ONLY) 15 min   Pt was supine in bed upon entry. Pt is on O2 @ 4L. Pt agreed to mobility. O2 vitals were taken throughout activity. Pt was able to independently get to EOB with bed features. Pt is able to STS independently with 2 WW. Pt did not need 2 WW for ambulation. Pt did state that he felt SOB during ambulation. Recovery breaks were used to recover from desat. After activity pt returned to bed with needs in reach.  Clem Rodes Mobility Specialist 07/24/24, 11:09 AM

## 2024-07-24 NOTE — Progress Notes (Signed)
 [facility]  ROUNDING NOTE   Subjective:   Patient sitting at bedside Alert and oriented, irritable today Denies nausea or vomiting Denies diarrhea Remains on 4 L nasal cannula  No new labs  Objective:  Vital signs in last 24 hours:  Temp:  [96.6 F (35.9 C)-99.6 F (37.6 C)] 98.2 F (36.8 C) (10/15 0820) Pulse Rate:  [73-92] 90 (10/15 0820) Resp:  [16-25] 20 (10/15 0820) BP: (84-121)/(42-73) 108/65 (10/15 0820) SpO2:  [92 %-99 %] 93 % (10/15 0820)  Weight change:  There were no vitals filed for this visit.  Intake/Output: I/O last 3 completed shifts: In: 200 [I.V.:200] Out: 6675 [Urine:6675]   Intake/Output this shift:  Total I/O In: 1486.2 [P.O.:120; I.V.:1366.2] Out: -   Physical Exam: General: NAD  Head: Normocephalic, atraumatic. Moist oral mucosal membranes  Eyes: Anicteric  Lungs:  Clear to auscultation, normal effort  Heart: Regular rate and rhythm  Abdomen:  Soft, nontender  Extremities:  No peripheral edema.  Neurologic: Awake, alert, conversant  Skin: Warm,dry, no rash       Basic Metabolic Panel: Recent Labs  Lab 07/19/24 1727 07/19/24 2107 07/20/24 0314 07/21/24 0550 07/22/24 0511 07/23/24 0547  NA 135  --  138 141  --  142  K 3.4*  --  3.0* 4.0  --  3.9  CL 103  --  111 111  --  115*  CO2 16*  --  17* 18*  --  20*  GLUCOSE 93  --  54* 78  --  114*  BUN 15  --  16 18  --  17  CREATININE 4.00*  --  4.01* 4.12* 3.74* 3.09*  CALCIUM  9.2  --  8.4* 8.8*  --  8.9  MG  --  1.8  --  2.2  --  1.9    Liver Function Tests: Recent Labs  Lab 07/19/24 1727  AST 22  ALT 11  ALKPHOS 96  BILITOT 0.6  PROT 7.3  ALBUMIN 4.1   Recent Labs  Lab 07/19/24 1727  LIPASE 58*   No results for input(s): AMMONIA in the last 168 hours.  CBC: Recent Labs  Lab 07/19/24 1727 07/20/24 0314 07/21/24 0550 07/23/24 0547  WBC 7.3 7.6 11.8* 10.6*  HGB 10.9* 8.5* 9.4* 9.6*  HCT 32.0* 25.2* 28.1* 29.2*  MCV 100.6* 100.4* 101.8*  101.0*  PLT 319 270 272 278    Cardiac Enzymes: No results for input(s): CKTOTAL, CKMB, CKMBINDEX, TROPONINI in the last 168 hours.  BNP: Invalid input(s): POCBNP  CBG: Recent Labs  Lab 07/21/24 0749 07/21/24 1214 07/21/24 2032 07/22/24 0749 07/22/24 1132  GLUCAP 74 94 78 144* 151*    Microbiology: Results for orders placed or performed during the hospital encounter of 07/19/24  Gastrointestinal Panel by PCR , Stool     Status: None   Collection Time: 07/19/24  8:15 PM   Specimen: Stool  Result Value Ref Range Status   Campylobacter species NOT DETECTED NOT DETECTED Final   Plesimonas shigelloides NOT DETECTED NOT DETECTED Final   Salmonella species NOT DETECTED NOT DETECTED Final   Yersinia enterocolitica NOT DETECTED NOT DETECTED Final   Vibrio species NOT DETECTED NOT DETECTED Final   Vibrio cholerae NOT DETECTED NOT DETECTED Final   Enteroaggregative E coli (EAEC) NOT DETECTED NOT DETECTED Final   Enteropathogenic E coli (EPEC) NOT DETECTED NOT DETECTED Final   Enterotoxigenic E coli (ETEC) NOT DETECTED NOT DETECTED Final   Shiga like toxin producing E coli (STEC) NOT  DETECTED NOT DETECTED Final   Shigella/Enteroinvasive E coli (EIEC) NOT DETECTED NOT DETECTED Final   Cryptosporidium NOT DETECTED NOT DETECTED Final   Cyclospora cayetanensis NOT DETECTED NOT DETECTED Final   Entamoeba histolytica NOT DETECTED NOT DETECTED Final   Giardia lamblia NOT DETECTED NOT DETECTED Final   Adenovirus F40/41 NOT DETECTED NOT DETECTED Final   Astrovirus NOT DETECTED NOT DETECTED Final   Norovirus GI/GII NOT DETECTED NOT DETECTED Final   Rotavirus A NOT DETECTED NOT DETECTED Final   Sapovirus (I, II, IV, and V) NOT DETECTED NOT DETECTED Final    Comment: Performed at [facility], [address] [place], [place] [address]  Stool culture     Status: None   Collection Time: 07/19/24  8:15 PM   Specimen: Stool  Result Value Ref Range Status    Salmonella/Shigella Screen WRORD  Final    Comment: (NOTE) Test not performed. The required specimen for the test ordered was not received. Received: P/P White Requires: P/P Orange Notified Stephanie H. 07/22/2024    Campylobacter Culture WRORD  Final    Comment: (NOTE) Test not performed. The required specimen for the test ordered was not received. Received: P/P White Requires: P/P [employer]. 07/22/2024    E coli, Shiga toxin Assay WRORD  Final    Comment: (NOTE) Test not performed. The required specimen for the test ordered was not received. Received: P/P White Requires: P/P [employer]. 07/22/2024 Performed At: [facility] [address] [place], [place] [id number] Jennette Shorter MD Ey:[id number]   C Difficile Quick Screen w PCR reflex     Status: None   Collection Time: 07/20/24 11:55 AM   Specimen: STOOL  Result Value Ref Range Status   C Diff antigen NEGATIVE NEGATIVE Final   C Diff toxin NEGATIVE NEGATIVE Final   C Diff interpretation No C. difficile detected.  Final    Comment: Performed at [facility], [address] [place], [place] [address]    Coagulation Studies: No results for input(s): LABPROT, INR in the last 72 hours.  Urinalysis: No results for input(s): COLORURINE, LABSPEC, PHURINE, GLUCOSEU, HGBUR, BILIRUBINUR, KETONESUR, PROTEINUR, UROBILINOGEN, NITRITE, LEUKOCYTESUR in the last 72 hours.  Invalid input(s): APPERANCEUR    Imaging: No results found.   Medications:    sodium chloride  75 mL/hr at 07/24/24 9157    Chlorhexidine  Gluconate Cloth  6 each Topical Daily   enoxaparin  (LOVENOX ) injection  30 mg Subcutaneous Q24H   feeding supplement  237 mL Oral BID BM   ferrous sulfate  325 mg Oral Q M,W,F   gabapentin   100 mg Oral QHS   midodrine   5 mg Oral TID WC   nicotine   14 mg Transdermal Daily   rosuvastatin   20 mg Oral Daily   tamsulosin   0.4 mg Oral  Daily   acetaminophen  **OR** acetaminophen , ipratropium-albuterol , loperamide, loratadine  Assessment/ Plan:  Edward Singleton is a 73 y.o.  male with past medical history of hypertension, hyperlipidemia, gout, GERD and chronic kidney disease IIIb, who was admitted to [facility] on 07/19/2024 for Dehydration [E86.0] Severe diarrhea [K52.9] AKI (acute kidney injury) [N17.9]   Acute kidney injury on chronic kidney disease stage IIIb. Baseline creatinine 1.69 with GFR 43 on 08/27/23. Renal US  negative for obstruction but shows some bilateral parenchymal atrophy. Acute kidney injury likely secondary to GI losses with poor solute intake.  Creatinine 4 on ED arrival.  No new labs obtained today, however they are ordered.  Patient reports relief of symptoms, nausea and vomiting.  Continue IV fluids for an additional day.  Lab Results  Component Value Date   CREATININE 3.09 (H) 07/23/2024   CREATININE 3.74 (H) 07/22/2024   CREATININE 4.12 (H) 07/21/2024    Intake/Output Summary (Last 24 hours) at 07/24/2024 1108 Last data filed at 07/24/2024 1015 Gross per 24 hour  Intake 1686.19 ml  Output 6675 ml  Net -4988.81 ml    2. Anemia of chronic kidney disease Lab Results  Component Value Date   HGB 9.6 (L) 07/23/2024    Hgb 9.6 at last check.  3.  Hypotension with history of hypertension with chronic kidney disease.  Home regimen includes metoprolol , currently held during this admission.  Currently prescribed midodrine  10 mg 3 times daily. Blood pressure 108/65 this morning   4. Diabetes mellitus type II with chronic kidney disease/renal manifestations: noninsulin dependent. Home regimen includes metformin . Most recent hemoglobin A1c is 5.8 on 10/12/2022.   Primary team to continue management.   LOS: 5 Sophia Cubero 10/15/202511:08 AM

## 2024-07-24 NOTE — Progress Notes (Signed)
 Brief Assessment Note  Medical record reviewed and patient has no TOC needs at this time. Please outreach to [facility] if needs are identified.

## 2024-07-25 DIAGNOSIS — K529 Noninfective gastroenteritis and colitis, unspecified: Secondary | ICD-10-CM | POA: Diagnosis not present

## 2024-07-25 LAB — COMPREHENSIVE METABOLIC PANEL WITH GFR
ALT: 15 U/L (ref 0–44)
AST: 25 U/L (ref 15–41)
Albumin: 2.8 g/dL — ABNORMAL LOW (ref 3.5–5.0)
Alkaline Phosphatase: 68 U/L (ref 38–126)
Anion gap: 12 (ref 5–15)
BUN: 19 mg/dL (ref 8–23)
CO2: 19 mmol/L — ABNORMAL LOW (ref 22–32)
Calcium: 8.6 mg/dL — ABNORMAL LOW (ref 8.9–10.3)
Chloride: 108 mmol/L (ref 98–111)
Creatinine, Ser: 2.37 mg/dL — ABNORMAL HIGH (ref 0.61–1.24)
GFR, Estimated: 28 mL/min — ABNORMAL LOW (ref >60)
Glucose, Bld: 130 mg/dL — ABNORMAL HIGH (ref 70–99)
Potassium: 4.8 mmol/L (ref 3.5–5.1)
Sodium: 139 mmol/L (ref 135–145)
Total Bilirubin: 0.7 mg/dL (ref 0.0–1.2)
Total Protein: 5.8 g/dL — ABNORMAL LOW (ref 6.5–8.1)

## 2024-07-25 LAB — CREATININE, SERUM
Creatinine, Ser: 2.43 mg/dL — ABNORMAL HIGH (ref 0.61–1.24)
GFR, Estimated: 28 mL/min — ABNORMAL LOW (ref >60)

## 2024-07-25 MED ORDER — TAMSULOSIN HCL 0.4 MG PO CAPS: 0.4000 mg | ORAL_CAPSULE | Freq: Every day | ORAL | 0 refills | Status: AC

## 2024-07-25 MED ORDER — OMEPRAZOLE 40 MG PO CPDR: 40.0000 mg | DELAYED_RELEASE_CAPSULE | Freq: Every day | ORAL | Status: AC

## 2024-07-25 MED ORDER — ALLOPURINOL 100 MG PO TABS: 50.0000 mg | ORAL_TABLET | ORAL | 0 refills | Status: AC

## 2024-07-25 MED ORDER — LOPERAMIDE HCL 2 MG PO CAPS: 2.0000 mg | ORAL_CAPSULE | Freq: Two times a day (BID) | ORAL | 0 refills | Status: AC

## 2024-07-25 NOTE — Progress Notes (Signed)
 [facility]  ROUNDING NOTE   Subjective:   Patient seen sitting up in recliner Alert and oriented Tolerating meals without nausea or vomiting Room air  Creatinine 2.43 Urine output 1.6 L in preceding 24 hours.  Objective:  Vital signs in last 24 hours:  Temp:  [97.7 F (36.5 C)-99.3 F (37.4 C)] 97.7 F (36.5 C) (10/16 0741) Pulse Rate:  [86-109] 109 (10/16 0741) Resp:  [17-20] 17 (10/16 0741) BP: (107-126)/(64-74) 125/67 (10/16 0741) SpO2:  [93 %-98 %] 97 % (10/16 0741)  Weight change:  There were no vitals filed for this visit.  Intake/Output: I/O last 3 completed shifts: In: 1726.2 [P.O.:360; I.V.:1366.2] Out: 6400 [Urine:6400]   Intake/Output this shift:  Total I/O In: 120 [P.O.:120] Out: 800 [Urine:800]  Physical Exam: General: NAD  Head: Normocephalic, atraumatic. Moist oral mucosal membranes  Eyes: Anicteric  Lungs:  Clear to auscultation, normal effort  Heart: Regular rate and rhythm  Abdomen:  Soft, nontender  Extremities:  No peripheral edema.  Neurologic: Awake, alert, conversant  Skin: Warm,dry, no rash       Basic Metabolic Panel: Recent Labs  Lab 07/19/24 1727 07/19/24 2107 07/20/24 0314 07/21/24 0550 07/22/24 0511 07/23/24 0547 07/24/24 1111 07/25/24 0432  NA 135  --  138 141  --  142 140  --   K 3.4*  --  3.0* 4.0  --  3.9 2.9*  --   CL 103  --  111 111  --  115* 108  --   CO2 16*  --  17* 18*  --  20* 21*  --   GLUCOSE 93  --  54* 78  --  114* 149*  --   BUN 15  --  16 18  --  17 16  --   CREATININE 4.00*  --  4.01* 4.12* 3.74* 3.09* 2.72* 2.43*  CALCIUM  9.2  --  8.4* 8.8*  --  8.9 8.0*  --   MG  --  1.8  --  2.2  --  1.9  --   --     Liver Function Tests: Recent Labs  Lab 07/19/24 1727  AST 22  ALT 11  ALKPHOS 96  BILITOT 0.6  PROT 7.3  ALBUMIN 4.1   Recent Labs  Lab 07/19/24 1727  LIPASE 58*   No results for input(s): AMMONIA in the last 168 hours.  CBC: Recent Labs  Lab 07/19/24 1727  07/20/24 0314 07/21/24 0550 07/23/24 0547  WBC 7.3 7.6 11.8* 10.6*  HGB 10.9* 8.5* 9.4* 9.6*  HCT 32.0* 25.2* 28.1* 29.2*  MCV 100.6* 100.4* 101.8* 101.0*  PLT 319 270 272 278    Cardiac Enzymes: No results for input(s): CKTOTAL, CKMB, CKMBINDEX, TROPONINI in the last 168 hours.  BNP: Invalid input(s): POCBNP  CBG: Recent Labs  Lab 07/21/24 0749 07/21/24 1214 07/21/24 2032 07/22/24 0749 07/22/24 1132  GLUCAP 74 94 78 144* 151*    Microbiology: Results for orders placed or performed during the hospital encounter of 07/19/24  Gastrointestinal Panel by PCR , Stool     Status: None   Collection Time: 07/19/24  8:15 PM   Specimen: Stool  Result Value Ref Range Status   Campylobacter species NOT DETECTED NOT DETECTED Final   Plesimonas shigelloides NOT DETECTED NOT DETECTED Final   Salmonella species NOT DETECTED NOT DETECTED Final   Yersinia enterocolitica NOT DETECTED NOT DETECTED Final   Vibrio species NOT DETECTED NOT DETECTED Final   Vibrio cholerae NOT DETECTED NOT DETECTED Final  Enteroaggregative E coli (EAEC) NOT DETECTED NOT DETECTED Final   Enteropathogenic E coli (EPEC) NOT DETECTED NOT DETECTED Final   Enterotoxigenic E coli (ETEC) NOT DETECTED NOT DETECTED Final   Shiga like toxin producing E coli (STEC) NOT DETECTED NOT DETECTED Final   Shigella/Enteroinvasive E coli (EIEC) NOT DETECTED NOT DETECTED Final   Cryptosporidium NOT DETECTED NOT DETECTED Final   Cyclospora cayetanensis NOT DETECTED NOT DETECTED Final   Entamoeba histolytica NOT DETECTED NOT DETECTED Final   Giardia lamblia NOT DETECTED NOT DETECTED Final   Adenovirus F40/41 NOT DETECTED NOT DETECTED Final   Astrovirus NOT DETECTED NOT DETECTED Final   Norovirus GI/GII NOT DETECTED NOT DETECTED Final   Rotavirus A NOT DETECTED NOT DETECTED Final   Sapovirus (I, II, IV, and V) NOT DETECTED NOT DETECTED Final    Comment: Performed at [facility], [address]  [place], [place] [address]  Stool culture     Status: None   Collection Time: 07/19/24  8:15 PM   Specimen: Stool  Result Value Ref Range Status   Salmonella/Shigella Screen WRORD  Final    Comment: (NOTE) Test not performed. The required specimen for the test ordered was not received. Received: P/P White Requires: P/P Orange Notified Stephanie H. 07/22/2024    Campylobacter Culture WRORD  Final    Comment: (NOTE) Test not performed. The required specimen for the test ordered was not received. Received: P/P White Requires: P/P [employer]. 07/22/2024    E coli, Shiga toxin Assay WRORD  Final    Comment: (NOTE) Test not performed. The required specimen for the test ordered was not received. Received: P/P White Requires: P/P [employer]. 07/22/2024 Performed At: [facility] [address] [place], [place] [id number] Jennette Shorter MD Ey:[id number]   C Difficile Quick Screen w PCR reflex     Status: None   Collection Time: 07/20/24 11:55 AM   Specimen: STOOL  Result Value Ref Range Status   C Diff antigen NEGATIVE NEGATIVE Final   C Diff toxin NEGATIVE NEGATIVE Final   C Diff interpretation No C. difficile detected.  Final    Comment: Performed at [facility], [address] [place], [place] [address]    Coagulation Studies: No results for input(s): LABPROT, INR in the last 72 hours.  Urinalysis: No results for input(s): COLORURINE, LABSPEC, PHURINE, GLUCOSEU, HGBUR, BILIRUBINUR, KETONESUR, PROTEINUR, UROBILINOGEN, NITRITE, LEUKOCYTESUR in the last 72 hours.  Invalid input(s): APPERANCEUR    Imaging: No results found.   Medications:      Chlorhexidine  Gluconate Cloth  6 each Topical Daily   enoxaparin  (LOVENOX ) injection  30 mg Subcutaneous Q24H   feeding supplement  237 mL Oral BID BM   ferrous sulfate  325 mg Oral Q M,W,F   gabapentin   100 mg Oral QHS   loperamide  2 mg Oral BID    midodrine   5 mg Oral TID WC   nicotine   14 mg Transdermal Daily   rosuvastatin   20 mg Oral Daily   tamsulosin   0.4 mg Oral Daily   acetaminophen  **OR** acetaminophen , famotidine, ipratropium-albuterol , loratadine  Assessment/ Plan:  [name] is a 73 y.o.  male with past medical history of hypertension, hyperlipidemia, gout, GERD and chronic kidney disease IIIb, who was admitted to [facility] on 07/19/2024 for Dehydration [E86.0] Severe diarrhea [K52.9] AKI (acute kidney injury) [N17.9]   Acute kidney injury on chronic kidney disease stage IIIb. Baseline creatinine 1.69 with GFR 43 on  08/27/23. Renal US  negative for obstruction but shows some bilateral parenchymal atrophy. Acute kidney injury likely secondary to GI losses with poor solute intake.  Creatinine 4 on ED arrival.   Renal function continues to improve with adequate oral intake in supportive measures.  Patient will need to follow-up in our office after discharge.  Will defer discharge plan to primary team.  Patient encouraged to maintain oral intake.  Lab Results  Component Value Date   CREATININE 2.43 (H) 07/25/2024   CREATININE 2.72 (H) 07/24/2024   CREATININE 3.09 (H) 07/23/2024    Intake/Output Summary (Last 24 hours) at 07/25/2024 1101 Last data filed at 07/25/2024 1028 Gross per 24 hour  Intake 360 ml  Output 2100 ml  Net -1740 ml    2. Anemia of chronic kidney disease Lab Results  Component Value Date   HGB 9.6 (L) 07/23/2024    Hgb 9.6 at last check.  3.  Hypotension with history of hypertension with chronic kidney disease.  Home regimen includes metoprolol , currently held during this admission.  Currently prescribed midodrine  10 mg 3 times daily. Blood pressure acceptable, 125/67.   4. Diabetes mellitus type II with chronic kidney disease/renal manifestations: noninsulin dependent. Home regimen includes metformin . Most recent hemoglobin A1c is 5.8 on 10/12/2022.   Glucose well-controlled.   LOS:  6 Milina Pagett 10/16/202511:01 AM

## 2024-07-25 NOTE — Plan of Care (Signed)
  Problem: Education: Goal: Knowledge of General Education information will improve Description: Including pain rating scale, medication(s)/side effects and non-pharmacologic comfort measures Outcome: Progressing   Problem: Clinical Measurements: Goal: Ability to maintain clinical measurements within normal limits will improve Outcome: Progressing Goal: Will remain free from infection Outcome: Progressing Goal: Diagnostic test results will improve Outcome: Progressing   Problem: Nutrition: Goal: Adequate nutrition will be maintained Outcome: Progressing   Problem: Elimination: Goal: Will not experience complications related to bowel motility Outcome: Progressing   

## 2024-07-25 NOTE — Discharge Summary (Signed)
 DISCHARGE SUMMARY    RIKI GEHRING FMW:[id number] DOB: Nov 28, 1950 DOA: 07/19/2024  PCP: Milissa Savant, MD  Admit date: 07/19/2024 Discharge date: 07/25/2024   Recommendations for Outpatient Follow-up:  Follow [facility] closely with [facility] care [facility] team to discuss medication changes during this hospital stay as well as chronic condition management   Hospital Course: Edward Singleton is a 73 year old male with hypertension, hyperlipidemia, CKD stage IIIb, gout, GERD, MDD, COPD not on chronic oxygen, current tobacco abuse, history of alcohol  abuse, prior splenectomy, who presents with chronic diarrhea.  Patient reports a 10 pound weight loss over the last 6 months and endorses diarrhea has become 10-15 times daily.  Patient underwent colonoscopy on 01/10/2024 which revealed some colitis.  Patient was seen his [facility] doctor who recommended that he present to the ED for evaluation of persistent diarrhea and dehydration.  C. difficile and GI pathogen negative.  GI was consulted on this admission and patient underwent unremarkable colonoscopy on 10/14.  GI suggest this is secondary to IBS and has recommended scheduled Imodium with titration to reasonable bowel movements.  Stay was further complicated by AKI, provoked by dehydration.  With IV fluids creatinine gradually resolved.  Nephrology was consulted and recommended close outpatient follow-up at time of discharge. Hospital stay further complicated by acute hypoxic respiratory failure.  Patient experiencing of again dyspnea on exertion with tachypnea and desaturated to 83% on room air.  CXR revealed COPD changes but no acute infection or edema.  Patient initially required supplemental O2 to maintain O2 sats but gradually was able to wean to room air.  He does report that he has been told in the past he may require oxygen but has never needed it.  He did perform walk trial successfully and maintained 94% on room air while ambulating on day  of discharge.  On day of discharge I spoke directly with the patient, as well as both of his sisters and discussed care plan at discharge.  All questions and concerns were answered to the best of my ability.  Chronic diarrhea, IBS - Colonoscopy 4/2 with colitis - Colonoscopy 10/14: Diverticulosis in the sigmoid and descending colon, biopsies with benign mucosa. - GI recommends Imodium titration.  Start at twice daily.  Patient is already having improvements in his bowel movements.  Will titrate Imodium at home to 1 bowel movement daily. - C. difficile and GI pathogens negative   AKI superimposed on CKD stage IIIb - Baseline creatinine around 1.69, creatinine 4.0 on arrival - Nephrology has been consulted - Has been on IV fluids with significant improvement. - Continue to follow with nephrology outpatient - Discontinue metformin  given GFR under 30, change allopurinol  dosing consistent with creatinine clearance of 25   Acute hypoxic respiratory failure - Persistent dyspnea on exertion, tachypnea, desaturating to 83% on room air initially.  Does have history of COPD and has previously been told he may require oxygen in the future though reportedly his [facility] physicians have not initiated this because he will not agree to quit smoking. - CXR with COPD changes, no evidence of pneumonia or edema - No wheezing appreciated to suggest COPD exacerbation - As his strength improved he was gradually able to wean off O2. - Walk trial today maintaining 94% on room air  Hypotension - Likely secondary to hypovolemia.  Discontinue antihypertensives.  Was on midodrine  during this day - Blood pressure resolved, not requiring midodrine  consistently.  Will hold at DC. - Advised to keep blood pressure log  outpatient follow-up closely with PCP   Acute urinary retention - Postvoid residual over 600 mL on multiple occasions - Foley catheter inserted 10/14 - Successfully passed T OV on 10/16.  Voiding with 0 cc  residual. - Continue Flomax  at DC   Hypokalemia - Secondary to diarrhea - Monitor and supplement as needed   Skin lesions - Needs close outpatient follow-up with dermatology.    Gout - Allopurinol  dosing changed to 50 mg every other day consistent with creatinine clearance of 25.  Hyperlipidemia - Continue statin   Type 2 diabetes - GFR consistently below 30 throughout the stay.  Discontinue metformin  at DC  Hypertension - Hypotensive on arrival.  Hold antihypertensives for now.  Resume as able outpatient with PCP  Discharge Instructions  Discharge Instructions     Ambulatory Referral for Lung Cancer Scre   Complete by: As directed    Call MD for:  difficulty breathing, headache or visual disturbances   Complete by: As directed    Call MD for:  persistant dizziness or light-headedness   Complete by: As directed    Call MD for:  persistant nausea and vomiting   Complete by: As directed    Call MD for:  severe uncontrolled pain   Complete by: As directed    Call MD for:  temperature >100.4   Complete by: As directed    Diet general   Complete by: As directed    Discharge instructions   Complete by: As directed    While admitted we made some changes to your blood pressure medications. Please review your discharge medications closely to ensure you are taking the correct meds at home. Please take your blood pressure once a day and keep a log. See your primary care doctor in one week to review this log and make further medication changes.  Due to your decreasing kidney function we have also made some changes in the medications that you were taking previously.  We recommend stopping metformin , and changing how you take allopurinol .  You will now take allopurinol  every other day at a much lower dose than you were taking previously.  We have added 2 new medications. Flomax : This will help to ensure you continue to urinate without retaining urine. Loperamide/Imodium: This  medication is for diarrhea.  You may take it up to 3 times a day or not at all depending on how your bowel movements are progressing.  Aim for 1-2 bowel movements a day. As we discussed probiotics can be very helpful, I recommend either taking a probiotic supplement or eating daily yogurt to help reestablish good gut bacteria.   Increase activity slowly   Complete by: As directed       Allergies as of 07/25/2024   No Known Allergies      Medication List     STOP taking these medications    metFORMIN  500 MG tablet Commonly known as: GLUCOPHAGE    metoprolol  succinate 50 MG 24 hr tablet Commonly known as: TOPROL -XL       TAKE these medications    allopurinol  100 MG tablet Commonly known as: ZYLOPRIM  Take 0.5 tablets (50 mg total) by mouth every other day. What changed:  medication strength how much to take when to take this   cefadroxil  500 MG capsule Commonly known as: DURICEF Take 250 mg by mouth 2 (two) times daily.   cetirizine 10 MG tablet Commonly known as: ZYRTEC Take 10 mg by mouth daily as needed for allergies.   escitalopram   10 MG tablet Commonly known as: LEXAPRO  Take 10 mg by mouth daily.   ferrous sulfate 325 (65 FE) MG EC tablet Take 325 mg by mouth every Monday, Wednesday, and Friday.   folic acid  1 MG tablet Commonly known as: FOLVITE  Take 1 mg by mouth daily.   gabapentin  100 MG capsule Commonly known as: NEURONTIN  Take 100 mg by mouth at bedtime.   loperamide 2 MG capsule Commonly known as: IMODIUM Take 1 capsule (2 mg total) by mouth 2 (two) times daily.   omeprazole  40 MG capsule Commonly known as: PRILOSEC Take 1 capsule (40 mg total) by mouth daily.   rosuvastatin  40 MG tablet Commonly known as: CRESTOR  Take 20 mg by mouth daily.   tamsulosin  0.4 MG Caps capsule Commonly known as: FLOMAX  Take 1 capsule (0.4 mg total) by mouth daily after supper.   thiamine  100 MG tablet Commonly known as: Vitamin B-1 Take 100 mg by mouth  daily.   traZODone  50 MG tablet Commonly known as: DESYREL  Take 1 tablet by mouth at bedtime.        Follow-up Information     Milissa Savant, MD Follow up.   Specialty: Internal Medicine Why: Hospital follow up Contact information: [address] [place] [address]                No Known Allergies  Consultations: Treatment Team:  Jinny Carmine, MD   Procedures/Studies: DG Chest Port 1 View Result Date: 07/21/2024 EXAM: 1 VIEW(S) XRAY OF THE CHEST 07/21/2024 10:50:45 AM COMPARISON: None available. CLINICAL HISTORY: 200808 Hypoxia 200808. FINDINGS: LUNGS AND PLEURA: Coarsened interstitial markings due to emphysema. No interstitial edema. No pleural effusion. No pneumothorax. HEART AND MEDIASTINUM: Atherosclerotic plaque. No acute abnormality of the cardiac and mediastinal silhouettes. BONES AND SOFT TISSUES: Right shoulder arthroplasty noted. Surgical clips in left upper abdomen. Old healed left proximal humerus fracture. IMPRESSION: 1. 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 2. No acute findings. 3. Aortic atherosclerotic calcification. Electronically signed by: Waddell Calk MD 07/21/2024 10:54 AM EDT RP Workstation: [id number]   US  Renal Result Date: 07/19/2024 CLINICAL DATA:  Acute kidney injury. EXAM: RENAL / URINARY TRACT ULTRASOUND COMPLETE COMPARISON:  CT abdomen and pelvis 10/18/2021 FINDINGS: Right Kidney: Renal measurements: 10.1 x 5.6 x 5.3 cm = volume: 156 mL. Increased parenchymal echotexture with diffuse parenchymal thinning consistent with chronic medical renal disease. No hydronephrosis or hydroureter. Focal cystic lesion in the lateral right kidney measuring 1.6 cm maximal diameter. This was present on previous CT. No imaging follow-up is indicated. Left Kidney: Renal measurements: 5.6 x 3 x 2.7 cm = volume: 24 mL. Diffuse renal parenchymal atrophy with increased echotexture suggesting chronic medical renal disease. No  hydronephrosis or hydroureter. Large cyst on the left kidney measuring 7.2 cm maximal diameter. No change since previous study. No imaging follow-up is indicated. Bladder: Appears normal for degree of bladder distention. Other: None. IMPRESSION: 1. Bilateral parenchymal atrophy and increased echotexture, greater on the left. This is consistent with chronic medical renal disease. 2. No hydronephrosis or hydroureter. 3. Benign-appearing cysts bilaterally are similar to prior CT. No imaging follow-up is indicated. Electronically Signed   By: Elsie Gravely M.D.   On: 07/19/2024 19:41      Discharge Exam: Vitals:   07/25/24 1027 07/25/24 1247  BP:    Pulse:    Resp:    Temp:    SpO2: 95% 94%   Vitals:   07/25/24 0419 07/25/24 0741 07/25/24 1027 07/25/24 1247  BP: 107/64 125/67    Pulse: 92 (!) 109    Resp:  17    Temp: 98.1 F (36.7 C) 97.7 F (36.5 C)    TempSrc: Oral Oral    SpO2: 97% 97% 95% 94%  Height:        Constitutional:  Normal appearance. Non toxic-appearing.  HENT: Head Normocephalic and atraumatic.  Mucous membranes are moist.  Eyes:  Extraocular intact. Conjunctivae normal.  Cardiovascular: Rate and Rhythm: Normal rate and regular rhythm.  Pulmonary: Some dyspnea on exertion, maintaining O2 sats on room air now. Skin: warm and dry. not jaundiced.  Neurological: No focal deficit present. alert. Oriented.  Psychiatric: Mood and Affect congruent.    The results of significant diagnostics from this hospitalization (including imaging, microbiology, ancillary and laboratory) are listed below for reference.     Microbiology: Recent Results (from the past 240 hours)  Gastrointestinal Panel by PCR , Stool     Status: None   Collection Time: 07/19/24  8:15 PM   Specimen: Stool  Result Value Ref Range Status   Campylobacter species NOT DETECTED NOT DETECTED Final   Plesimonas shigelloides NOT DETECTED NOT DETECTED Final   Salmonella species NOT DETECTED NOT DETECTED  Final   Yersinia enterocolitica NOT DETECTED NOT DETECTED Final   Vibrio species NOT DETECTED NOT DETECTED Final   Vibrio cholerae NOT DETECTED NOT DETECTED Final   Enteroaggregative E coli (EAEC) NOT DETECTED NOT DETECTED Final   Enteropathogenic E coli (EPEC) NOT DETECTED NOT DETECTED Final   Enterotoxigenic E coli (ETEC) NOT DETECTED NOT DETECTED Final   Shiga like toxin producing E coli (STEC) NOT DETECTED NOT DETECTED Final   Shigella/Enteroinvasive E coli (EIEC) NOT DETECTED NOT DETECTED Final   Cryptosporidium NOT DETECTED NOT DETECTED Final   Cyclospora cayetanensis NOT DETECTED NOT DETECTED Final   Entamoeba histolytica NOT DETECTED NOT DETECTED Final   Giardia lamblia NOT DETECTED NOT DETECTED Final   Adenovirus F40/41 NOT DETECTED NOT DETECTED Final   Astrovirus NOT DETECTED NOT DETECTED Final   Norovirus GI/GII NOT DETECTED NOT DETECTED Final   Rotavirus A NOT DETECTED NOT DETECTED Final   Sapovirus (I, II, IV, and V) NOT DETECTED NOT DETECTED Final    Comment: Performed at [facility], [address] [place], [place] [address]  Stool culture     Status: None   Collection Time: 07/19/24  8:15 PM   Specimen: Stool  Result Value Ref Range Status   Salmonella/Shigella Screen WRORD  Final    Comment: (NOTE) Test not performed. The required specimen for the test ordered was not received. Received: P/P White Requires: P/P Orange Notified Stephanie H. 07/22/2024    Campylobacter Culture WRORD  Final    Comment: (NOTE) Test not performed. The required specimen for the test ordered was not received. Received: P/P White Requires: P/P [employer]. 07/22/2024    E coli, Shiga toxin Assay WRORD  Final    Comment: (NOTE) Test not performed. The required specimen for the test ordered was not received. Received: P/P White Requires: P/P [employer]. 07/22/2024 Performed At: [facility] [address] [place], [place]  [id number] Jennette Shorter MD Ey:[phone]   C Difficile Quick Screen w PCR reflex     Status: None   Collection Time: 07/20/24 11:55 AM   Specimen: STOOL  Result Value Ref Range Status   C Diff antigen NEGATIVE NEGATIVE Final   C Diff toxin NEGATIVE NEGATIVE Final   C Diff  interpretation No C. difficile detected.  Final    Comment: Performed at [facility], [address] [place], [place] [address]     Labs: BNP (last 3 results) Recent Labs    07/21/24 2210  BNP 336.8*   Basic Metabolic Panel: Recent Labs  Lab 07/19/24 2107 07/20/24 0314 07/21/24 0550 07/22/24 0511 07/23/24 0547 07/24/24 1111 07/25/24 0432 07/25/24 1020  NA  --  138 141  --  142 140  --  139  K  --  3.0* 4.0  --  3.9 2.9*  --  4.8  CL  --  111 111  --  115* 108  --  108  CO2  --  17* 18*  --  20* 21*  --  19*  GLUCOSE  --  54* 78  --  114* 149*  --  130*  BUN  --  16 18  --  17 16  --  19  CREATININE  --  4.01* 4.12* 3.74* 3.09* 2.72* 2.43* 2.37*  CALCIUM   --  8.4* 8.8*  --  8.9 8.0*  --  8.6*  MG 1.8  --  2.2  --  1.9  --   --   --    Liver Function Tests: Recent Labs  Lab 07/19/24 1727 07/25/24 1020  AST 22 25  ALT 11 15  ALKPHOS 96 68  BILITOT 0.6 0.7  PROT 7.3 5.8*  ALBUMIN 4.1 2.8*   Recent Labs  Lab 07/19/24 1727  LIPASE 58*   No results for input(s): AMMONIA in the last 168 hours. CBC: Recent Labs  Lab 07/19/24 1727 07/20/24 0314 07/21/24 0550 07/23/24 0547  WBC 7.3 7.6 11.8* 10.6*  HGB 10.9* 8.5* 9.4* 9.6*  HCT 32.0* 25.2* 28.1* 29.2*  MCV 100.6* 100.4* 101.8* 101.0*  PLT 319 270 272 278   Cardiac Enzymes: No results for input(s): CKTOTAL, CKMB, CKMBINDEX, TROPONINI in the last 168 hours. BNP: Invalid input(s): POCBNP CBG: Recent Labs  Lab 07/21/24 0749 07/21/24 1214 07/21/24 2032 07/22/24 0749 07/22/24 1132  GLUCAP 74 94 78 144* 151*   D-Dimer No results for input(s): DDIMER in the last 72 hours. Hgb A1c No results for  input(s): HGBA1C in the last 72 hours. Lipid Profile No results for input(s): CHOL, HDL, LDLCALC, TRIG, CHOLHDL, LDLDIRECT in the last 72 hours. Thyroid function studies No results for input(s): TSH, T4TOTAL, T3FREE, THYROIDAB in the last 72 hours.  Invalid input(s): FREET3 Anemia work up No results for input(s): VITAMINB12, FOLATE, FERRITIN, TIBC, IRON , RETICCTPCT in the last 72 hours. Urinalysis    Component Value Date/Time   COLORURINE STRAW (A) 07/19/2024 2143   APPEARANCEUR CLEAR (A) 07/19/2024 2143   APPEARANCEUR Clear 12/05/2023 1511   LABSPEC 1.006 07/19/2024 2143   LABSPEC 1.014 01/15/2014 1320   PHURINE 5.0 07/19/2024 2143   GLUCOSEU NEGATIVE 07/19/2024 2143   GLUCOSEU Negative 01/15/2014 1320   HGBUR SMALL (A) 07/19/2024 2143   BILIRUBINUR NEGATIVE 07/19/2024 2143   BILIRUBINUR Negative 12/05/2023 1511   BILIRUBINUR Negative 01/15/2014 1320   KETONESUR NEGATIVE 07/19/2024 2143   PROTEINUR 30 (A) 07/19/2024 2143   UROBILINOGEN 1.0 11/29/2014 1534   NITRITE NEGATIVE 07/19/2024 2143   LEUKOCYTESUR NEGATIVE 07/19/2024 2143   LEUKOCYTESUR Negative 01/15/2014 1320   Sepsis Labs Recent Labs  Lab 07/19/24 1727 07/20/24 0314 07/21/24 0550 07/23/24 0547  WBC 7.3 7.6 11.8* 10.6*   Microbiology Recent Results (from the past 240 hours)  Gastrointestinal Panel by PCR , Stool  Status: None   Collection Time: 07/19/24  8:15 PM   Specimen: Stool  Result Value Ref Range Status   Campylobacter species NOT DETECTED NOT DETECTED Final   Plesimonas shigelloides NOT DETECTED NOT DETECTED Final   Salmonella species NOT DETECTED NOT DETECTED Final   Yersinia enterocolitica NOT DETECTED NOT DETECTED Final   Vibrio species NOT DETECTED NOT DETECTED Final   Vibrio cholerae NOT DETECTED NOT DETECTED Final   Enteroaggregative E coli (EAEC) NOT DETECTED NOT DETECTED Final   Enteropathogenic E coli (EPEC) NOT DETECTED NOT DETECTED Final    Enterotoxigenic E coli (ETEC) NOT DETECTED NOT DETECTED Final   Shiga like toxin producing E coli (STEC) NOT DETECTED NOT DETECTED Final   Shigella/Enteroinvasive E coli (EIEC) NOT DETECTED NOT DETECTED Final   Cryptosporidium NOT DETECTED NOT DETECTED Final   Cyclospora cayetanensis NOT DETECTED NOT DETECTED Final   Entamoeba histolytica NOT DETECTED NOT DETECTED Final   Giardia lamblia NOT DETECTED NOT DETECTED Final   Adenovirus F40/41 NOT DETECTED NOT DETECTED Final   Astrovirus NOT DETECTED NOT DETECTED Final   Norovirus GI/GII NOT DETECTED NOT DETECTED Final   Rotavirus A NOT DETECTED NOT DETECTED Final   Sapovirus (I, II, IV, and V) NOT DETECTED NOT DETECTED Final    Comment: Performed at [facility], [address] [place], [place] [address]  Stool culture     Status: None   Collection Time: 07/19/24  8:15 PM   Specimen: Stool  Result Value Ref Range Status   Salmonella/Shigella Screen WRORD  Final    Comment: (NOTE) Test not performed. The required specimen for the test ordered was not received. Received: P/P White Requires: P/P Orange Notified Stephanie H. 07/22/2024    Campylobacter Culture WRORD  Final    Comment: (NOTE) Test not performed. The required specimen for the test ordered was not received. Received: P/P White Requires: P/P [employer]. 07/22/2024    E coli, Shiga toxin Assay WRORD  Final    Comment: (NOTE) Test not performed. The required specimen for the test ordered was not received. Received: P/P White Requires: P/P [employer]. 07/22/2024 Performed At: [facility] [address] [place], [place] [id number] Jennette Shorter MD Ey:[phone]   C Difficile Quick Screen w PCR reflex     Status: None   Collection Time: 07/20/24 11:55 AM   Specimen: STOOL  Result Value Ref Range Status   C Diff antigen NEGATIVE NEGATIVE Final   C Diff toxin NEGATIVE NEGATIVE Final   C Diff interpretation No C.  difficile detected.  Final    Comment: Performed at [facility], [address], [place], [place] [address]     Time coordinating discharge: 32 min   SIGNED: Lorane Poland, DO [facility] 07/25/2024, 1:49 PM Pager   If 7PM-7AM, please contact night-coverage

## 2024-07-25 NOTE — Anesthesia Postprocedure Evaluation (Signed)
 Anesthesia Post Note  Patient: Edward Singleton  Procedure(s) Performed: COLONOSCOPY  Patient location during evaluation: PACU Anesthesia Type: General Level of consciousness: awake Pain management: satisfactory to patient Vital Signs Assessment: post-procedure vital signs reviewed and stable Respiratory status: spontaneous breathing Cardiovascular status: stable Anesthetic complications: no   No notable events documented.   Last Vitals:  Vitals:   07/24/24 1956 07/25/24 0419  BP: 126/74 107/64  Pulse: 86 92  Resp: 18   Temp: 37.4 C 36.7 C  SpO2: 93% 97%    Last Pain:  Vitals:   07/25/24 0419  TempSrc: Oral  PainSc:                  VAN STAVEREN,Roselene Gray

## 2024-07-25 NOTE — Progress Notes (Signed)
 Mobility Specialist - Progress Note     07/25/24 1100  Mobility  Activity Ambulated with assistance;Stood at bedside  Level of Assistance Standby assist, set-up cues, supervision of patient - no hands on  Assistive Device None  Distance Ambulated (ft) 160 ft  Range of Motion/Exercises Active  Activity Response Tolerated well  Mobility Referral Yes  Mobility visit 1 Mobility  Mobility Specialist Start Time (ACUTE ONLY) 1112  Mobility Specialist Stop Time (ACUTE ONLY) 1127  Mobility Specialist Time Calculation (min) (ACUTE ONLY) 15 min   Pt resting in bed on 2L upon entry. Pt STS and ambulates to hallway around NS SBA with no AD. Pt returned to bed and left with needs in reach and bed alarm activated.   Guido Rumble Mobility Specialist 07/25/24, 2:11 PM

## 2024-07-25 NOTE — Progress Notes (Signed)
SATURATION QUALIFICATIONS: (This note is used to comply with regulatory documentation for home oxygen)  Patient Saturations on Room Air at Rest = 95%  Patient Saturations on Room Air while Ambulating = 94%
